# Patient Record
Sex: Female | Born: 1940
Health system: Southern US, Community
[De-identification: ages and names within clinical notes are randomized; demographics above are authoritative.]

## PROBLEM LIST (undated history)

## (undated) DIAGNOSIS — M25519 Pain in unspecified shoulder: Secondary | ICD-10-CM

## (undated) DIAGNOSIS — C50919 Malignant neoplasm of unspecified site of unspecified female breast: Secondary | ICD-10-CM

## (undated) DIAGNOSIS — Z803 Family history of malignant neoplasm of breast: Secondary | ICD-10-CM

## (undated) DIAGNOSIS — D369 Benign neoplasm, unspecified site: Secondary | ICD-10-CM

## (undated) DIAGNOSIS — I10 Essential (primary) hypertension: Secondary | ICD-10-CM

## (undated) DIAGNOSIS — T4145XA Adverse effect of unspecified anesthetic, initial encounter: Secondary | ICD-10-CM

## (undated) DIAGNOSIS — H3530 Unspecified macular degeneration: Secondary | ICD-10-CM

## (undated) DIAGNOSIS — M542 Cervicalgia: Secondary | ICD-10-CM

## (undated) DIAGNOSIS — N890 Mild vaginal dysplasia: Secondary | ICD-10-CM

## (undated) DIAGNOSIS — R112 Nausea with vomiting, unspecified: Secondary | ICD-10-CM

## (undated) DIAGNOSIS — J40 Bronchitis, not specified as acute or chronic: Secondary | ICD-10-CM

## (undated) DIAGNOSIS — Z923 Personal history of irradiation: Secondary | ICD-10-CM

## (undated) DIAGNOSIS — M810 Age-related osteoporosis without current pathological fracture: Secondary | ICD-10-CM

## (undated) DIAGNOSIS — Z853 Personal history of malignant neoplasm of breast: Secondary | ICD-10-CM

## (undated) DIAGNOSIS — C55 Malignant neoplasm of uterus, part unspecified: Principal | ICD-10-CM

## (undated) DIAGNOSIS — R55 Syncope and collapse: Secondary | ICD-10-CM

## (undated) DIAGNOSIS — Z9889 Other specified postprocedural states: Secondary | ICD-10-CM

## (undated) DIAGNOSIS — D259 Leiomyoma of uterus, unspecified: Secondary | ICD-10-CM

## (undated) DIAGNOSIS — N3090 Cystitis, unspecified without hematuria: Secondary | ICD-10-CM

## (undated) DIAGNOSIS — T8859XA Other complications of anesthesia, initial encounter: Secondary | ICD-10-CM

## (undated) DIAGNOSIS — D649 Anemia, unspecified: Secondary | ICD-10-CM

## (undated) HISTORY — DX: Benign neoplasm, unspecified site: D36.9

## (undated) HISTORY — DX: Family history of malignant neoplasm of breast: Z80.3

## (undated) HISTORY — DX: Anemia, unspecified: D64.9

## (undated) HISTORY — DX: Unspecified macular degeneration: H35.30

## (undated) HISTORY — PX: EXCISION VAGINAL CYST: SHX5825

## (undated) HISTORY — DX: Malignant neoplasm of uterus, part unspecified: C55

## (undated) HISTORY — DX: Personal history of malignant neoplasm of breast: Z85.3

## (undated) HISTORY — DX: Mild vaginal dysplasia: N89.0

## (undated) HISTORY — DX: Cystitis, unspecified without hematuria: N30.90

## (undated) HISTORY — DX: Malignant neoplasm of unspecified site of unspecified female breast: C50.919

## (undated) HISTORY — DX: Leiomyoma of uterus, unspecified: D25.9

## (undated) HISTORY — PX: CATARACT EXTRACTION: SUR2

## (undated) HISTORY — DX: Personal history of irradiation: Z92.3

## (undated) HISTORY — DX: Syncope and collapse: R55

## (undated) HISTORY — DX: Age-related osteoporosis without current pathological fracture: M81.0

---

## 1988-03-31 DIAGNOSIS — C50919 Malignant neoplasm of unspecified site of unspecified female breast: Secondary | ICD-10-CM

## 1988-03-31 HISTORY — PX: BREAST RECONSTRUCTION: SHX9

## 1988-03-31 HISTORY — PX: OTHER SURGICAL HISTORY: SHX169

## 1988-03-31 HISTORY — DX: Malignant neoplasm of unspecified site of unspecified female breast: C50.919

## 1992-08-29 HISTORY — PX: BREAST IMPLANT REMOVAL: SUR1101

## 1998-02-13 ENCOUNTER — Ambulatory Visit (HOSPITAL_COMMUNITY): Admission: RE | Admit: 1998-02-13 | Discharge: 1998-02-13 | Payer: Self-pay

## 1998-10-08 ENCOUNTER — Other Ambulatory Visit: Admission: RE | Admit: 1998-10-08 | Discharge: 1998-10-08 | Payer: Self-pay | Admitting: *Deleted

## 1999-02-18 ENCOUNTER — Ambulatory Visit (HOSPITAL_COMMUNITY): Admission: RE | Admit: 1999-02-18 | Discharge: 1999-02-18 | Payer: Self-pay | Admitting: *Deleted

## 1999-02-18 ENCOUNTER — Encounter: Payer: Self-pay | Admitting: *Deleted

## 1999-11-20 ENCOUNTER — Other Ambulatory Visit: Admission: RE | Admit: 1999-11-20 | Discharge: 1999-11-20 | Payer: Self-pay | Admitting: *Deleted

## 2000-02-19 ENCOUNTER — Ambulatory Visit (HOSPITAL_COMMUNITY): Admission: RE | Admit: 2000-02-19 | Discharge: 2000-02-19 | Payer: Self-pay | Admitting: *Deleted

## 2000-02-19 ENCOUNTER — Encounter: Payer: Self-pay | Admitting: *Deleted

## 2000-10-13 ENCOUNTER — Emergency Department (HOSPITAL_COMMUNITY): Admission: EM | Admit: 2000-10-13 | Discharge: 2000-10-13 | Payer: Self-pay | Admitting: Emergency Medicine

## 2000-10-18 ENCOUNTER — Encounter: Payer: Self-pay | Admitting: Internal Medicine

## 2000-10-18 ENCOUNTER — Ambulatory Visit (HOSPITAL_COMMUNITY): Admission: RE | Admit: 2000-10-18 | Discharge: 2000-10-18 | Payer: Self-pay | Admitting: Internal Medicine

## 2000-11-13 ENCOUNTER — Ambulatory Visit (HOSPITAL_COMMUNITY): Admission: RE | Admit: 2000-11-13 | Discharge: 2000-11-13 | Payer: Self-pay | Admitting: Cardiology

## 2000-11-19 ENCOUNTER — Other Ambulatory Visit: Admission: RE | Admit: 2000-11-19 | Discharge: 2000-11-19 | Payer: Self-pay | Admitting: *Deleted

## 2001-02-19 ENCOUNTER — Encounter: Payer: Self-pay | Admitting: *Deleted

## 2001-02-19 ENCOUNTER — Ambulatory Visit (HOSPITAL_COMMUNITY): Admission: RE | Admit: 2001-02-19 | Discharge: 2001-02-19 | Payer: Self-pay | Admitting: *Deleted

## 2001-11-22 ENCOUNTER — Other Ambulatory Visit: Admission: RE | Admit: 2001-11-22 | Discharge: 2001-11-22 | Payer: Self-pay | Admitting: *Deleted

## 2002-02-21 ENCOUNTER — Encounter: Payer: Self-pay | Admitting: *Deleted

## 2002-02-21 ENCOUNTER — Ambulatory Visit (HOSPITAL_COMMUNITY): Admission: RE | Admit: 2002-02-21 | Discharge: 2002-02-21 | Payer: Self-pay | Admitting: *Deleted

## 2003-02-24 ENCOUNTER — Ambulatory Visit (HOSPITAL_COMMUNITY): Admission: RE | Admit: 2003-02-24 | Discharge: 2003-02-24 | Payer: Self-pay | Admitting: Geriatric Medicine

## 2003-11-27 ENCOUNTER — Other Ambulatory Visit: Admission: RE | Admit: 2003-11-27 | Discharge: 2003-11-27 | Payer: Self-pay | Admitting: *Deleted

## 2004-03-05 ENCOUNTER — Ambulatory Visit (HOSPITAL_COMMUNITY): Admission: RE | Admit: 2004-03-05 | Discharge: 2004-03-05 | Payer: Self-pay | Admitting: *Deleted

## 2005-03-27 ENCOUNTER — Ambulatory Visit (HOSPITAL_COMMUNITY): Admission: RE | Admit: 2005-03-27 | Discharge: 2005-03-27 | Payer: Self-pay | Admitting: *Deleted

## 2005-12-02 ENCOUNTER — Other Ambulatory Visit: Admission: RE | Admit: 2005-12-02 | Discharge: 2005-12-02 | Payer: Self-pay | Admitting: Obstetrics & Gynecology

## 2005-12-03 ENCOUNTER — Encounter: Admission: RE | Admit: 2005-12-03 | Discharge: 2005-12-26 | Payer: Self-pay | Admitting: Oncology

## 2005-12-03 ENCOUNTER — Encounter (HOSPITAL_COMMUNITY): Admission: RE | Admit: 2005-12-03 | Discharge: 2005-12-26 | Payer: Self-pay | Admitting: Oncology

## 2005-12-03 ENCOUNTER — Ambulatory Visit (HOSPITAL_COMMUNITY): Payer: Self-pay | Admitting: Oncology

## 2006-04-01 ENCOUNTER — Ambulatory Visit (HOSPITAL_COMMUNITY): Admission: RE | Admit: 2006-04-01 | Discharge: 2006-04-01 | Payer: Self-pay | Admitting: Obstetrics and Gynecology

## 2006-10-05 ENCOUNTER — Encounter: Admission: RE | Admit: 2006-10-05 | Discharge: 2006-10-05 | Payer: Self-pay | Admitting: Surgery

## 2006-10-08 ENCOUNTER — Encounter (INDEPENDENT_AMBULATORY_CARE_PROVIDER_SITE_OTHER): Payer: Self-pay | Admitting: Surgery

## 2006-10-08 ENCOUNTER — Ambulatory Visit (HOSPITAL_BASED_OUTPATIENT_CLINIC_OR_DEPARTMENT_OTHER): Admission: RE | Admit: 2006-10-08 | Discharge: 2006-10-09 | Payer: Self-pay | Admitting: Surgery

## 2006-10-08 HISTORY — PX: MASTECTOMY: SHX3

## 2006-11-23 ENCOUNTER — Encounter: Admission: RE | Admit: 2006-11-23 | Discharge: 2006-11-23 | Payer: Self-pay | Admitting: Gastroenterology

## 2008-01-28 ENCOUNTER — Other Ambulatory Visit: Admission: RE | Admit: 2008-01-28 | Discharge: 2008-01-28 | Payer: Self-pay | Admitting: Obstetrics & Gynecology

## 2010-08-13 NOTE — Op Note (Signed)
NAME:  NGOC, DETJEN NO.:  [id number]   MEDICAL RECORD NO.:  [id number]          PATIENT TYPE:  AMB   LOCATION:  DSC                          FACILITY:  [facility]   PHYSICIAN:  Currie Paris, M.D.DATE OF BIRTH:  1940/06/06   DATE OF PROCEDURE:  10/08/2006  DATE OF DISCHARGE:                               OPERATIVE REPORT   PREOPERATIVE DIAGNOSIS:  History of left breast cancer status post  mastectomy with marked asymmetry.   POSTOPERATIVE DIAGNOSIS:  History of left breast cancer status post  mastectomy with marked asymmetry.   OPERATION:  Right total mastectomy (prophylactic).   SURGEON:  Currie Paris, M.D.   ANESTHESIA:  General.   CLINICAL HISTORY:  This is a 70 year old lady who is status post a left  mastectomy for breast cancer many years ago.  She originally had a  reconstruction done but that implant had to be taken out.  She has also  had some other risk factors for familial breast cancer and for reasons  of prophylaxis as well as to obtain symmetry, elected to have a right  mastectomy.   DESCRIPTION OF PROCEDURE:  The patient was seen in the holding area and  she had no further questions.  We confirmed that a right mastectomy was  the operative plan and we both initialed the right breast.  The patient  was taken to the operating room and after satisfactory general  anesthesia had been obtained, the right breast was prepped and draped.  The time out occurred.  I outlined an elliptical incision to take a  generous amount of skin.  She has fairly large breasts and I wanted to  make sure we had a fairly smooth skin surface.  The superior incision  was made and a skin flap raised medially to the sternum, superiorly to  the clavicle, and laterally into the axilla, to raise up the axillary  skin.  The inferior incision was made and the inferior flap raised going  medially to the sternum, inferiorly to the inframammary fold, and  laterally  until we could identify the latissimus and I was able to  identify the latissimus all the way along its anterior edge.   The breast was then removed from the underlying muscle starting medially  and working laterally.  When I got to the edge of the pectoralis, I  opened the clavipectoral fascia.  I then divided the breast and  subcutaneous fatty tissue off of the anterior border of the platysma  starting inferiorly and working superiorly such that when I had traction  up on the breast, I was then able to divide the final attachments as the  breast tissue entered the axilla, trying to get all of the breast out  without really entering the axilla proper.   I spent several minutes making sure everything was dry and irrigating.  I put two 19 Blake drains in.  I reinspected the area for hemostasis and  things were dry.  It appeared, however, that I had a lot of excess skin  laterally, especially the inferior lateral portion, and we were  going to  have a lot of dog ear type of changes here.  I, therefore, excised a  little additional skin laterally so that we would have a more smooth  closure.  I again irrigated and made sure everything was dry.  I closed  the incision with staples.   The patient tolerated the procedure well.  There were no operative  complications.  All counts were correct.  Estimated blood loss was less  than 50 mL.      Currie Paris, M.D.  Electronically Signed     CJS/MEDQ  D:  10/08/2006  T:  10/08/2006  Job:  [id number]   cc:   Ladona Horns. Mariel Sleet, MD

## 2010-08-16 NOTE — Procedures (Signed)
[facility]  Patient:    Beth Simon, Beth Simon Visit Number: [id number] MRN: [id number]          Service Type: CAT Location: [facility] 2899 [id number] Attending Physician:  Armanda Magic Proc. Date: 11/13/00 Adm. Date:  11/13/2000 Disc. Date: 11/13/2000   CC:         Hal T. Stoneking, M.D.   Procedure Report  PROCEDURE:  Tilt table test.  CARDIOLOGIST:  Armanda Magic, M.D.  HISTORY:  This is a 70 year old female with a history of syncopal episodes while she was at work leaning over, looking over a computer with her boss. 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  She was brought by EMS to the hospital fully conscious.  She now presents for tilt table testing.  INDICATIONS:  Syncope.  COMPLICATIONS:  None.  DESCRIPTION OF PROCEDURE.  The patient was brought to the cardiac catheterization laboratory in the fasting ______  state.  Informed consent was obtained.  The patient was connected to continuous heart rate and pulse oximetry monitoring and intermittent blood pressure monitoring.  The patient had blood pressures measured at baseline lying in a supine position for 5 minutes, and then was tilted upright 70 degrees for a total of 17 minutes.  Baseline blood pressure was 125/59 to 140/59.  Upon upright tilt, the blood pressure 16 minutes with the tilt dropped form 125/60 mmHg to 97/27 mmHg and then dropped at the 17th minute to 63/18 mmHg.  The heart rate also went from 118 beats per minute to 78 beats per minute acutely.  The patient became diaphoretic, pale, her pupils dilated.  She had ringing in her ears and developed facial twisting.  She did not have a complete syncopal episode,  It completely did reproduce her symptoms that she had the day that she had the syncopal episode.  She was immediately placed supine with resolution of hypotension.  IMPRESSION:   Positive tilt table test for vasovagal syncope.  PLAN:  Start Zoloft 25 mg a day.  She is going to follow up with me in several weeks. Attending Physician:  Armanda Magic DD:  11/17/00 TD:  11/17/00 Job: [id number]

## 2011-01-14 LAB — CBC
MCHC: 34.2
MCV: 92
RBC: 3.98

## 2011-01-14 LAB — DIFFERENTIAL
Lymphocytes Relative: 38
Lymphs Abs: 2.6
Neutro Abs: 3.5
Neutrophils Relative %: 50

## 2011-01-14 LAB — URINALYSIS, ROUTINE W REFLEX MICROSCOPIC
Bilirubin Urine: NEGATIVE
Nitrite: NEGATIVE
Specific Gravity, Urine: 1.01
Urobilinogen, UA: 0.2

## 2011-01-14 LAB — COMPREHENSIVE METABOLIC PANEL
AST: 26
BUN: 14
CO2: 27
Calcium: 8.8
Creatinine, Ser: 0.84
GFR calc Af Amer: >60
GFR calc non Af Amer: >60
Glucose, Bld: 96

## 2011-01-14 LAB — URINE MICROSCOPIC-ADD ON

## 2011-04-16 DIAGNOSIS — M722 Plantar fascial fibromatosis: Secondary | ICD-10-CM | POA: Diagnosis not present

## 2011-04-28 DIAGNOSIS — Z0000 Encounter for general adult medical examination without abnormal findings: Secondary | ICD-10-CM | POA: Diagnosis not present

## 2011-04-28 DIAGNOSIS — Z01419 Encounter for gynecological examination (general) (routine) without abnormal findings: Secondary | ICD-10-CM | POA: Diagnosis not present

## 2011-04-28 DIAGNOSIS — Z124 Encounter for screening for malignant neoplasm of cervix: Secondary | ICD-10-CM | POA: Diagnosis not present

## 2011-04-28 DIAGNOSIS — E559 Vitamin D deficiency, unspecified: Secondary | ICD-10-CM | POA: Diagnosis not present

## 2011-04-30 DIAGNOSIS — M722 Plantar fascial fibromatosis: Secondary | ICD-10-CM | POA: Diagnosis not present

## 2011-04-30 DIAGNOSIS — M25579 Pain in unspecified ankle and joints of unspecified foot: Secondary | ICD-10-CM | POA: Diagnosis not present

## 2011-06-13 ENCOUNTER — Other Ambulatory Visit: Payer: Self-pay | Admitting: Occupational Therapy

## 2011-06-13 ENCOUNTER — Ambulatory Visit
Admission: RE | Admit: 2011-06-13 | Discharge: 2011-06-13 | Disposition: A | Discharge status: Home / Self Care | Payer: Medicare Other | Source: Ambulatory Visit | Attending: Nurse Practitioner | Admitting: Nurse Practitioner

## 2011-06-13 DIAGNOSIS — Z0000 Encounter for general adult medical examination without abnormal findings: Secondary | ICD-10-CM

## 2011-06-13 DIAGNOSIS — Z853 Personal history of malignant neoplasm of breast: Secondary | ICD-10-CM | POA: Diagnosis not present

## 2011-06-13 DIAGNOSIS — M722 Plantar fascial fibromatosis: Secondary | ICD-10-CM | POA: Diagnosis not present

## 2011-06-13 DIAGNOSIS — M25579 Pain in unspecified ankle and joints of unspecified foot: Secondary | ICD-10-CM | POA: Diagnosis not present

## 2011-06-13 DIAGNOSIS — R9389 Abnormal findings on diagnostic imaging of other specified body structures: Secondary | ICD-10-CM | POA: Diagnosis not present

## 2011-06-17 ENCOUNTER — Ambulatory Visit: Payer: Medicare Other | Attending: Sports Medicine | Admitting: Physical Therapy

## 2011-06-17 DIAGNOSIS — M25673 Stiffness of unspecified ankle, not elsewhere classified: Secondary | ICD-10-CM | POA: Insufficient documentation

## 2011-06-17 DIAGNOSIS — M25579 Pain in unspecified ankle and joints of unspecified foot: Secondary | ICD-10-CM | POA: Diagnosis not present

## 2011-06-17 DIAGNOSIS — M25676 Stiffness of unspecified foot, not elsewhere classified: Secondary | ICD-10-CM | POA: Diagnosis not present

## 2011-06-17 DIAGNOSIS — IMO0001 Reserved for inherently not codable concepts without codable children: Secondary | ICD-10-CM | POA: Insufficient documentation

## 2011-06-19 ENCOUNTER — Ambulatory Visit: Payer: Medicare Other | Admitting: Physical Therapy

## 2011-06-19 DIAGNOSIS — M25673 Stiffness of unspecified ankle, not elsewhere classified: Secondary | ICD-10-CM | POA: Diagnosis not present

## 2011-06-19 DIAGNOSIS — M25579 Pain in unspecified ankle and joints of unspecified foot: Secondary | ICD-10-CM | POA: Diagnosis not present

## 2011-06-19 DIAGNOSIS — IMO0001 Reserved for inherently not codable concepts without codable children: Secondary | ICD-10-CM | POA: Diagnosis not present

## 2011-06-24 ENCOUNTER — Ambulatory Visit: Payer: Medicare Other | Admitting: Physical Therapy

## 2011-06-24 DIAGNOSIS — M25579 Pain in unspecified ankle and joints of unspecified foot: Secondary | ICD-10-CM | POA: Diagnosis not present

## 2011-06-24 DIAGNOSIS — M25673 Stiffness of unspecified ankle, not elsewhere classified: Secondary | ICD-10-CM | POA: Diagnosis not present

## 2011-06-24 DIAGNOSIS — IMO0001 Reserved for inherently not codable concepts without codable children: Secondary | ICD-10-CM | POA: Diagnosis not present

## 2011-06-25 ENCOUNTER — Ambulatory Visit: Payer: Medicare Other | Admitting: Physical Therapy

## 2011-06-25 DIAGNOSIS — M25579 Pain in unspecified ankle and joints of unspecified foot: Secondary | ICD-10-CM | POA: Diagnosis not present

## 2011-06-25 DIAGNOSIS — IMO0001 Reserved for inherently not codable concepts without codable children: Secondary | ICD-10-CM | POA: Diagnosis not present

## 2011-06-25 DIAGNOSIS — M25673 Stiffness of unspecified ankle, not elsewhere classified: Secondary | ICD-10-CM | POA: Diagnosis not present

## 2011-07-01 ENCOUNTER — Ambulatory Visit: Payer: Medicare Other | Attending: Sports Medicine | Admitting: Physical Therapy

## 2011-07-01 DIAGNOSIS — M545 Low back pain, unspecified: Secondary | ICD-10-CM | POA: Insufficient documentation

## 2011-07-01 DIAGNOSIS — R293 Abnormal posture: Secondary | ICD-10-CM | POA: Insufficient documentation

## 2011-07-01 DIAGNOSIS — M6281 Muscle weakness (generalized): Secondary | ICD-10-CM | POA: Insufficient documentation

## 2011-07-01 DIAGNOSIS — IMO0001 Reserved for inherently not codable concepts without codable children: Secondary | ICD-10-CM | POA: Insufficient documentation

## 2011-07-03 ENCOUNTER — Ambulatory Visit: Payer: Medicare Other | Attending: Sports Medicine | Admitting: Physical Therapy

## 2011-07-03 DIAGNOSIS — IMO0001 Reserved for inherently not codable concepts without codable children: Secondary | ICD-10-CM | POA: Insufficient documentation

## 2011-07-03 DIAGNOSIS — M25676 Stiffness of unspecified foot, not elsewhere classified: Secondary | ICD-10-CM | POA: Insufficient documentation

## 2011-07-03 DIAGNOSIS — M25579 Pain in unspecified ankle and joints of unspecified foot: Secondary | ICD-10-CM | POA: Insufficient documentation

## 2011-07-03 DIAGNOSIS — M25673 Stiffness of unspecified ankle, not elsewhere classified: Secondary | ICD-10-CM | POA: Insufficient documentation

## 2011-07-08 ENCOUNTER — Ambulatory Visit: Payer: Medicare Other | Admitting: Physical Therapy

## 2011-07-10 ENCOUNTER — Ambulatory Visit: Payer: Medicare Other | Admitting: Physical Therapy

## 2011-07-15 ENCOUNTER — Ambulatory Visit: Payer: Medicare Other | Admitting: Physical Therapy

## 2011-07-17 ENCOUNTER — Ambulatory Visit: Payer: Medicare Other | Admitting: Physical Therapy

## 2011-07-22 ENCOUNTER — Ambulatory Visit: Payer: Medicare Other | Admitting: Physical Therapy

## 2011-07-23 ENCOUNTER — Ambulatory Visit: Payer: Medicare Other | Admitting: Physical Therapy

## 2011-07-28 ENCOUNTER — Encounter: Payer: Medicare Other | Admitting: Physical Therapy

## 2011-08-01 DIAGNOSIS — L819 Disorder of pigmentation, unspecified: Secondary | ICD-10-CM | POA: Diagnosis not present

## 2011-08-01 DIAGNOSIS — L570 Actinic keratosis: Secondary | ICD-10-CM | POA: Diagnosis not present

## 2011-08-01 DIAGNOSIS — L608 Other nail disorders: Secondary | ICD-10-CM | POA: Diagnosis not present

## 2011-08-06 DIAGNOSIS — H8110 Benign paroxysmal vertigo, unspecified ear: Secondary | ICD-10-CM | POA: Diagnosis not present

## 2011-08-08 ENCOUNTER — Ambulatory Visit: Payer: Medicare Other | Attending: Sports Medicine | Admitting: Physical Therapy

## 2011-08-08 DIAGNOSIS — M545 Low back pain, unspecified: Secondary | ICD-10-CM | POA: Insufficient documentation

## 2011-08-08 DIAGNOSIS — M6281 Muscle weakness (generalized): Secondary | ICD-10-CM | POA: Insufficient documentation

## 2011-08-08 DIAGNOSIS — IMO0001 Reserved for inherently not codable concepts without codable children: Secondary | ICD-10-CM | POA: Insufficient documentation

## 2011-08-08 DIAGNOSIS — R293 Abnormal posture: Secondary | ICD-10-CM | POA: Diagnosis not present

## 2011-08-12 ENCOUNTER — Ambulatory Visit: Payer: Medicare Other | Admitting: Physical Therapy

## 2011-08-12 DIAGNOSIS — R293 Abnormal posture: Secondary | ICD-10-CM | POA: Diagnosis not present

## 2011-08-12 DIAGNOSIS — M6281 Muscle weakness (generalized): Secondary | ICD-10-CM | POA: Diagnosis not present

## 2011-08-12 DIAGNOSIS — M545 Low back pain, unspecified: Secondary | ICD-10-CM | POA: Diagnosis not present

## 2011-08-12 DIAGNOSIS — IMO0001 Reserved for inherently not codable concepts without codable children: Secondary | ICD-10-CM | POA: Diagnosis not present

## 2011-08-20 ENCOUNTER — Encounter: Payer: Medicare Other | Admitting: Physical Therapy

## 2011-08-20 DIAGNOSIS — H04129 Dry eye syndrome of unspecified lacrimal gland: Secondary | ICD-10-CM | POA: Diagnosis not present

## 2011-08-21 ENCOUNTER — Ambulatory Visit: Payer: Medicare Other | Admitting: Physical Therapy

## 2011-08-21 DIAGNOSIS — IMO0001 Reserved for inherently not codable concepts without codable children: Secondary | ICD-10-CM | POA: Diagnosis not present

## 2011-08-21 DIAGNOSIS — M545 Low back pain, unspecified: Secondary | ICD-10-CM | POA: Diagnosis not present

## 2011-08-21 DIAGNOSIS — R293 Abnormal posture: Secondary | ICD-10-CM | POA: Diagnosis not present

## 2011-08-21 DIAGNOSIS — M6281 Muscle weakness (generalized): Secondary | ICD-10-CM | POA: Diagnosis not present

## 2011-08-22 ENCOUNTER — Encounter: Payer: Medicare Other | Admitting: Physical Therapy

## 2011-08-27 ENCOUNTER — Encounter: Payer: Medicare Other | Admitting: Physical Therapy

## 2011-08-28 ENCOUNTER — Ambulatory Visit: Payer: Medicare Other | Admitting: Physical Therapy

## 2011-08-28 DIAGNOSIS — M6281 Muscle weakness (generalized): Secondary | ICD-10-CM | POA: Diagnosis not present

## 2011-08-28 DIAGNOSIS — M545 Low back pain, unspecified: Secondary | ICD-10-CM | POA: Diagnosis not present

## 2011-08-28 DIAGNOSIS — R293 Abnormal posture: Secondary | ICD-10-CM | POA: Diagnosis not present

## 2011-08-28 DIAGNOSIS — IMO0001 Reserved for inherently not codable concepts without codable children: Secondary | ICD-10-CM | POA: Diagnosis not present

## 2011-12-19 DIAGNOSIS — Z79899 Other long term (current) drug therapy: Secondary | ICD-10-CM | POA: Diagnosis not present

## 2011-12-19 DIAGNOSIS — E7800 Pure hypercholesterolemia, unspecified: Secondary | ICD-10-CM | POA: Diagnosis not present

## 2011-12-19 DIAGNOSIS — Z0000 Encounter for general adult medical examination without abnormal findings: Secondary | ICD-10-CM | POA: Diagnosis not present

## 2011-12-19 DIAGNOSIS — H8110 Benign paroxysmal vertigo, unspecified ear: Secondary | ICD-10-CM | POA: Diagnosis not present

## 2011-12-19 DIAGNOSIS — Z1331 Encounter for screening for depression: Secondary | ICD-10-CM | POA: Diagnosis not present

## 2012-01-06 ENCOUNTER — Ambulatory Visit: Payer: Medicare Other | Admitting: Physical Therapy

## 2012-01-06 DIAGNOSIS — Z23 Encounter for immunization: Secondary | ICD-10-CM | POA: Diagnosis not present

## 2012-01-13 ENCOUNTER — Ambulatory Visit: Payer: Medicare Other | Attending: Urology | Admitting: Rehabilitative and Restorative Service Providers"

## 2012-01-13 DIAGNOSIS — H8110 Benign paroxysmal vertigo, unspecified ear: Secondary | ICD-10-CM | POA: Insufficient documentation

## 2012-01-13 DIAGNOSIS — IMO0001 Reserved for inherently not codable concepts without codable children: Secondary | ICD-10-CM | POA: Diagnosis not present

## 2012-01-23 ENCOUNTER — Encounter: Payer: Medicare Other | Admitting: Rehabilitative and Restorative Service Providers"

## 2012-02-04 DIAGNOSIS — L819 Disorder of pigmentation, unspecified: Secondary | ICD-10-CM | POA: Diagnosis not present

## 2012-02-04 DIAGNOSIS — L219 Seborrheic dermatitis, unspecified: Secondary | ICD-10-CM | POA: Diagnosis not present

## 2012-02-04 DIAGNOSIS — L570 Actinic keratosis: Secondary | ICD-10-CM | POA: Diagnosis not present

## 2012-03-05 DIAGNOSIS — M810 Age-related osteoporosis without current pathological fracture: Secondary | ICD-10-CM | POA: Diagnosis not present

## 2012-04-14 DIAGNOSIS — M810 Age-related osteoporosis without current pathological fracture: Secondary | ICD-10-CM | POA: Diagnosis not present

## 2012-04-29 DIAGNOSIS — Z01419 Encounter for gynecological examination (general) (routine) without abnormal findings: Secondary | ICD-10-CM | POA: Diagnosis not present

## 2012-04-29 DIAGNOSIS — Z124 Encounter for screening for malignant neoplasm of cervix: Secondary | ICD-10-CM | POA: Diagnosis not present

## 2012-06-18 DIAGNOSIS — E559 Vitamin D deficiency, unspecified: Secondary | ICD-10-CM | POA: Diagnosis not present

## 2012-06-18 DIAGNOSIS — M25579 Pain in unspecified ankle and joints of unspecified foot: Secondary | ICD-10-CM | POA: Diagnosis not present

## 2012-06-18 DIAGNOSIS — M545 Low back pain, unspecified: Secondary | ICD-10-CM | POA: Diagnosis not present

## 2012-06-18 DIAGNOSIS — R5383 Other fatigue: Secondary | ICD-10-CM | POA: Diagnosis not present

## 2012-06-18 DIAGNOSIS — M810 Age-related osteoporosis without current pathological fracture: Secondary | ICD-10-CM | POA: Diagnosis not present

## 2012-06-18 DIAGNOSIS — R5381 Other malaise: Secondary | ICD-10-CM | POA: Diagnosis not present

## 2012-06-18 DIAGNOSIS — R079 Chest pain, unspecified: Secondary | ICD-10-CM | POA: Diagnosis not present

## 2012-06-18 DIAGNOSIS — S22009A Unspecified fracture of unspecified thoracic vertebra, initial encounter for closed fracture: Secondary | ICD-10-CM | POA: Diagnosis not present

## 2012-06-22 ENCOUNTER — Ambulatory Visit: Payer: Medicare Other | Attending: Sports Medicine | Admitting: Physical Therapy

## 2012-06-22 DIAGNOSIS — M25579 Pain in unspecified ankle and joints of unspecified foot: Secondary | ICD-10-CM | POA: Insufficient documentation

## 2012-06-22 DIAGNOSIS — IMO0001 Reserved for inherently not codable concepts without codable children: Secondary | ICD-10-CM | POA: Insufficient documentation

## 2012-06-22 DIAGNOSIS — M25673 Stiffness of unspecified ankle, not elsewhere classified: Secondary | ICD-10-CM | POA: Diagnosis not present

## 2012-06-22 DIAGNOSIS — M25676 Stiffness of unspecified foot, not elsewhere classified: Secondary | ICD-10-CM | POA: Insufficient documentation

## 2012-06-23 ENCOUNTER — Telehealth: Payer: Self-pay | Admitting: Nurse Practitioner

## 2012-06-23 ENCOUNTER — Ambulatory Visit: Payer: Medicare Other | Admitting: Physical Therapy

## 2012-06-23 NOTE — Telephone Encounter (Signed)
Pt called and wanted to let Patty know that she went to an orthopedic dr and the ortho dr wants her to start Prolia (?) for osteoporosis. Wanted to check with Patty to see if this was okay and a good idea. Please advise.

## 2012-06-24 DIAGNOSIS — M19049 Primary osteoarthritis, unspecified hand: Secondary | ICD-10-CM | POA: Diagnosis not present

## 2012-06-24 DIAGNOSIS — M2550 Pain in unspecified joint: Secondary | ICD-10-CM | POA: Diagnosis not present

## 2012-06-24 DIAGNOSIS — M25649 Stiffness of unspecified hand, not elsewhere classified: Secondary | ICD-10-CM | POA: Diagnosis not present

## 2012-06-24 NOTE — Telephone Encounter (Signed)
I do think Prolia is a good choice for her as there are less side effects.  PG

## 2012-06-28 DIAGNOSIS — M25649 Stiffness of unspecified hand, not elsewhere classified: Secondary | ICD-10-CM | POA: Diagnosis not present

## 2012-06-28 DIAGNOSIS — M2550 Pain in unspecified joint: Secondary | ICD-10-CM | POA: Diagnosis not present

## 2012-06-28 DIAGNOSIS — M19049 Primary osteoarthritis, unspecified hand: Secondary | ICD-10-CM | POA: Diagnosis not present

## 2012-06-29 ENCOUNTER — Ambulatory Visit: Payer: Medicare Other | Attending: Sports Medicine | Admitting: Physical Therapy

## 2012-06-29 DIAGNOSIS — IMO0001 Reserved for inherently not codable concepts without codable children: Secondary | ICD-10-CM | POA: Diagnosis not present

## 2012-06-29 DIAGNOSIS — H8110 Benign paroxysmal vertigo, unspecified ear: Secondary | ICD-10-CM | POA: Diagnosis not present

## 2012-06-30 ENCOUNTER — Telehealth: Payer: Self-pay | Admitting: *Deleted

## 2012-06-30 NOTE — Telephone Encounter (Signed)
PATIENT INFORMED OF ADVISE RESPONSE FROM [employer] OF APPROVAL FOR PATIENT TO BEGIN GETTING PROLIA INJECTIONS. PATIENT STATES HER ORTHOPEDIC DOCTOR AND CLINIC WILL BE GIVING THIS INJECTION TO HER. PATIENT TO CALL BACK IF ANY PROBLEMS. Beth Simon

## 2012-07-01 ENCOUNTER — Ambulatory Visit: Payer: Medicare Other | Admitting: Physical Therapy

## 2012-07-05 DIAGNOSIS — M25649 Stiffness of unspecified hand, not elsewhere classified: Secondary | ICD-10-CM | POA: Diagnosis not present

## 2012-07-05 DIAGNOSIS — M2550 Pain in unspecified joint: Secondary | ICD-10-CM | POA: Diagnosis not present

## 2012-07-05 DIAGNOSIS — M19049 Primary osteoarthritis, unspecified hand: Secondary | ICD-10-CM | POA: Diagnosis not present

## 2012-07-07 ENCOUNTER — Ambulatory Visit: Payer: Medicare Other | Admitting: Physical Therapy

## 2012-07-09 ENCOUNTER — Ambulatory Visit: Payer: Medicare Other | Admitting: Physical Therapy

## 2012-07-09 DIAGNOSIS — M19049 Primary osteoarthritis, unspecified hand: Secondary | ICD-10-CM | POA: Diagnosis not present

## 2012-07-12 DIAGNOSIS — M2550 Pain in unspecified joint: Secondary | ICD-10-CM | POA: Diagnosis not present

## 2012-07-12 DIAGNOSIS — M25649 Stiffness of unspecified hand, not elsewhere classified: Secondary | ICD-10-CM | POA: Diagnosis not present

## 2012-07-12 DIAGNOSIS — M19049 Primary osteoarthritis, unspecified hand: Secondary | ICD-10-CM | POA: Diagnosis not present

## 2012-07-13 ENCOUNTER — Encounter: Payer: Medicare Other | Admitting: Physical Therapy

## 2012-07-13 ENCOUNTER — Ambulatory Visit: Payer: Medicare Other | Admitting: Physical Therapy

## 2012-07-15 ENCOUNTER — Ambulatory Visit: Payer: Medicare Other | Admitting: Physical Therapy

## 2012-07-19 ENCOUNTER — Encounter: Payer: Self-pay | Admitting: Physical Therapy

## 2012-07-20 ENCOUNTER — Ambulatory Visit: Payer: Medicare Other | Admitting: Physical Therapy

## 2012-07-22 DIAGNOSIS — M19049 Primary osteoarthritis, unspecified hand: Secondary | ICD-10-CM | POA: Diagnosis not present

## 2012-07-22 DIAGNOSIS — M25649 Stiffness of unspecified hand, not elsewhere classified: Secondary | ICD-10-CM | POA: Diagnosis not present

## 2012-07-22 DIAGNOSIS — M2550 Pain in unspecified joint: Secondary | ICD-10-CM | POA: Diagnosis not present

## 2012-07-23 DIAGNOSIS — M810 Age-related osteoporosis without current pathological fracture: Secondary | ICD-10-CM | POA: Diagnosis not present

## 2012-07-23 DIAGNOSIS — M25549 Pain in joints of unspecified hand: Secondary | ICD-10-CM | POA: Diagnosis not present

## 2012-09-23 DIAGNOSIS — H04129 Dry eye syndrome of unspecified lacrimal gland: Secondary | ICD-10-CM | POA: Diagnosis not present

## 2013-01-04 ENCOUNTER — Ambulatory Visit
Admission: RE | Admit: 2013-01-04 | Discharge: 2013-01-04 | Disposition: A | Discharge status: Home / Self Care | Payer: Medicare Other | Source: Ambulatory Visit | Attending: Geriatric Medicine | Admitting: Geriatric Medicine

## 2013-01-04 ENCOUNTER — Other Ambulatory Visit: Payer: Self-pay | Admitting: Geriatric Medicine

## 2013-01-04 DIAGNOSIS — E041 Nontoxic single thyroid nodule: Secondary | ICD-10-CM

## 2013-01-04 DIAGNOSIS — Z1331 Encounter for screening for depression: Secondary | ICD-10-CM | POA: Diagnosis not present

## 2013-01-04 DIAGNOSIS — Z0000 Encounter for general adult medical examination without abnormal findings: Secondary | ICD-10-CM | POA: Diagnosis not present

## 2013-01-04 DIAGNOSIS — Z79899 Other long term (current) drug therapy: Secondary | ICD-10-CM | POA: Diagnosis not present

## 2013-01-04 DIAGNOSIS — E042 Nontoxic multinodular goiter: Secondary | ICD-10-CM | POA: Diagnosis not present

## 2013-01-14 DIAGNOSIS — M810 Age-related osteoporosis without current pathological fracture: Secondary | ICD-10-CM | POA: Diagnosis not present

## 2013-01-14 DIAGNOSIS — E559 Vitamin D deficiency, unspecified: Secondary | ICD-10-CM | POA: Diagnosis not present

## 2013-01-14 DIAGNOSIS — R5381 Other malaise: Secondary | ICD-10-CM | POA: Diagnosis not present

## 2013-02-04 DIAGNOSIS — M25549 Pain in joints of unspecified hand: Secondary | ICD-10-CM | POA: Diagnosis not present

## 2013-02-04 DIAGNOSIS — M810 Age-related osteoporosis without current pathological fracture: Secondary | ICD-10-CM | POA: Diagnosis not present

## 2013-02-18 ENCOUNTER — Ambulatory Visit (INDEPENDENT_AMBULATORY_CARE_PROVIDER_SITE_OTHER): Payer: Medicare Other

## 2013-02-18 ENCOUNTER — Ambulatory Visit (INDEPENDENT_AMBULATORY_CARE_PROVIDER_SITE_OTHER): Payer: Medicare Other | Admitting: Podiatrist

## 2013-02-18 DIAGNOSIS — B351 Tinea unguium: Secondary | ICD-10-CM

## 2013-02-18 DIAGNOSIS — Z23 Encounter for immunization: Secondary | ICD-10-CM | POA: Diagnosis not present

## 2013-02-18 DIAGNOSIS — R52 Pain, unspecified: Secondary | ICD-10-CM | POA: Diagnosis not present

## 2013-02-18 DIAGNOSIS — M779 Enthesopathy, unspecified: Secondary | ICD-10-CM

## 2013-02-18 DIAGNOSIS — D219 Benign neoplasm of connective and other soft tissue, unspecified: Secondary | ICD-10-CM | POA: Diagnosis not present

## 2013-02-18 DIAGNOSIS — D3610 Benign neoplasm of peripheral nerves and autonomic nervous system, unspecified: Secondary | ICD-10-CM

## 2013-02-18 NOTE — Progress Notes (Signed)
  Subjective:    Patient ID: Beth Simon, female    DOB: 1940-10-25, 72 y.o.   MRN: [id number]  Foot Pain This is a new problem. The current episode started more than 1 year ago. The problem occurs constantly. The problem has been unchanged. The symptoms are aggravated by standing and walking. Treatments tried: ORTHOTICS. The treatment provided no relief.   Subjective:  Patient presents for foot pain in the left foot, She relates she is experiencing a burning sensation.  She also relates she has painful elongated toenails which she would like addressed today as well.  She points to the top of the foot as the area of discomfort.      Review of Systems  Allergic/Immunologic: Positive for environmental allergies.  All other systems reviewed and are negative.       Objective:   Physical Exam GENERAL APPEARANCE: Alert, conversant. Appropriately groomed. No acute distress.  VASCULAR: Pedal pulses palpable bilateral.  Capillary refill time is immediate to all digits,  Proximal to distal cooling it warm to warm.  Digital hair growth is present bilateral  NEUROLOGIC: sensation is intact epicritically and protectively to 5.07 monofilament at 5/5 sites bilateral.  Light touch is intact bilateral, vibratory sensation intact bilateral,  Neuritis type symptomatology is present at the 2nd and 3rd interspace on the left foot   MUSCULOSKELETAL: acceptable muscle strength, tone and stability bilateral.  Intrinsic muscluature intact bilateral.  Discomfort along the dorsal lateral aspect of the left foot is present  DERMATOLOGIC: skin color, texture, and turger are within normal limits.  No preulcerative lesions are seen, no interdigital maceration noted.  No open lesions present.  Digital nails are elongated but otherwise normal.      Assessment & Plan:  Assessment:  Neuroma/ neuritis; tendonitis,  Painful mycotic nails  Plan:  Discussed treatment options,  Inserted a pad on her existing inserts to try  and re-align and decrease the pressure on the metatarsal heads.  Debrided the nails without complication.  Discussed injection therapy as well.  I will see her back for a recheck of her symptoms.  Nails were also debrided per her request today- patient was instructed that this will be a non-covered service due to the absence of class finding and normal appearance of nails.  Recommended [facility] for further care of her nails.

## 2013-02-18 NOTE — Patient Instructions (Signed)
Try [facility] at [address] area.    Call if the nerve pain fails to improve or if your feet still hurt even after the added padding placed in your inserts.

## 2013-04-29 ENCOUNTER — Encounter: Payer: Self-pay | Admitting: Obstetrics & Gynecology

## 2013-05-02 ENCOUNTER — Encounter: Payer: Self-pay | Admitting: Nurse Practitioner

## 2013-05-02 ENCOUNTER — Ambulatory Visit (INDEPENDENT_AMBULATORY_CARE_PROVIDER_SITE_OTHER): Payer: Medicare Other | Admitting: Nurse Practitioner

## 2013-05-02 VITALS — BP 120/80 | HR 72 | Ht 59.0 in | Wt 129.0 lb

## 2013-05-02 DIAGNOSIS — M810 Age-related osteoporosis without current pathological fracture: Secondary | ICD-10-CM | POA: Insufficient documentation

## 2013-05-02 DIAGNOSIS — C50919 Malignant neoplasm of unspecified site of unspecified female breast: Secondary | ICD-10-CM | POA: Diagnosis not present

## 2013-05-02 DIAGNOSIS — F439 Reaction to severe stress, unspecified: Secondary | ICD-10-CM | POA: Insufficient documentation

## 2013-05-02 DIAGNOSIS — Z733 Stress, not elsewhere classified: Secondary | ICD-10-CM | POA: Diagnosis not present

## 2013-05-02 DIAGNOSIS — Z01419 Encounter for gynecological examination (general) (routine) without abnormal findings: Secondary | ICD-10-CM

## 2013-05-02 DIAGNOSIS — C50912 Malignant neoplasm of unspecified site of left female breast: Secondary | ICD-10-CM | POA: Insufficient documentation

## 2013-05-02 DIAGNOSIS — Z124 Encounter for screening for malignant neoplasm of cervix: Secondary | ICD-10-CM

## 2013-05-02 DIAGNOSIS — Z9013 Acquired absence of bilateral breasts and nipples: Secondary | ICD-10-CM | POA: Insufficient documentation

## 2013-05-02 DIAGNOSIS — Z9010 Acquired absence of unspecified breast and nipple: Secondary | ICD-10-CM

## 2013-05-02 NOTE — Patient Instructions (Signed)

## 2013-05-02 NOTE — Progress Notes (Signed)
Patient ID: Beth Simon, female   DOB: 1940/08/25, 73 y.o.   MRN: [id number] 73 y.o. G1P1 Married Caucasian Fe here for annual exam.  Has had 2 shots of Prolia. Last one November 2014 and scheduled for BMD maybe this May or routinely in December.  Dr. Wandra Feinstein is following.  Left posterior rib area with pain since last February.  She has had X-Rays which show no fracture but she still hurts in the same area since then. She admits to being tired all the time.  PCP feels this is muscular since she has to do a lot of lifting with her husband.  He  is now 75 years old and is bedridden.  He is now followed by Hospice.  His bed wound on the heel is her biggest concern.  Patient's last menstrual period was 03/31/1988.          Sexually active: no  The current method of family planning is none.    Exercising: no  caregiver, on feet all the time Smoker:  no  Health Maintenance: Pap:  04/29/12, WNL MMG:  bilateral mastectomy Colonoscopy:  2006, repeat in 10 years BMD:  02/2012, -2.6/-2.2/-2.0 TDaP:  ? 2008 Shingles vaccine: 2008 Labs: PCP   reports that she has never smoked. She has never used smokeless tobacco. She reports that she does not drink alcohol or use illicit drugs.  Past Medical History  Diagnosis Date  . Osteoporosis     on Evista  . Breast cancer     bilateral mastectomy, adenoca breast-left MRM, reconstruction, chemo  . Cystitis     cytoxen  . Vasovagal syncopes   . Macular degeneration   . Anemia   . Uterine fibroid     Past Surgical History  Procedure Laterality Date  . Excision vaginal cyst    . Breast implant removal  6/94    left breast  . Cataract extraction      left  . Mastectomy  10/08/06    bilateral    Current Outpatient Prescriptions  Medication Sig Dispense Refill  . Calcium-Magnesium-Vitamin D (CALCIUM 500 PO) Take by mouth daily.      . Coenzyme Q10 (COQ10 PO) Take by mouth daily.      . Cyanocobalamin (VITAMIN B 12 PO) Take by mouth.       . denosumab (PROLIA) 60 MG/ML SOLN injection Inject 60 mg into the skin every 6 (six) months. Administer in upper arm, thigh, or abdomen      . Flaxseed, Linseed, (FLAX SEEDS PO) Take by mouth daily.      . Glucosamine HCl (GLUCOSAMINE PO) Take by mouth daily.      Marland Kitchen MELATONIN PO Take by mouth daily.      . NON FORMULARY mag powder      . Omega-3 Fatty Acids (FISH OIL PO) Take by mouth daily.      . raloxifene (EVISTA) 60 MG tablet Take 60 mg by mouth daily.      Marland Kitchen VITAMIN D, CHOLECALCIFEROL, PO Take 4,000 Int'l Units by mouth daily.       No current facility-administered medications for this visit.    Family History  Problem Relation Age of Onset  . Breast cancer Sister 78    recurrence age 46  . Breast cancer Paternal Grandmother   . Heart disease Mother   . Heart disease Father   . Hypertension Sister     ROS:  Pertinent items are noted in HPI.  Otherwise, a comprehensive  ROS was negative.  Exam:   BP 120/80  Pulse 72  Ht 4\' 11"  (1.499 m)  Wt 129 lb (58.514 kg)  BMI 26.04 kg/m2  LMP 03/31/1988 Height: 4\' 11"  (149.9 cm)  Ht Readings from Last 3 Encounters:  05/02/13 4\' 11"  (1.499 m)    General appearance: alert, cooperative and appears stated age Head: Normocephalic, without obvious abnormality, atraumatic Neck: no adenopathy, supple, symmetrical, trachea midline and thyroid normal to inspection and palpation Lungs: clear to auscultation bilaterally Breasts: bilateral mastectomy with surgical scars. Heart: regular rate and rhythm Abdomen: soft, non-tender; no masses,  no organomegaly Extremities: extremities normal, atraumatic, no cyanosis or edema Skin: Skin color, texture, turgor normal. No rashes or lesions Lymph nodes: Cervical, supraclavicular, and axillary nodes normal. No abnormal inguinal nodes palpated Neurologic: Grossly normal   Pelvic: External genitalia:  no lesions              Urethra:  normal appearing urethra with no masses, tenderness or  lesions              Bartholin's and Skene's: normal                 Vagina: normal appearing vagina with normal color and discharge, no lesions              Cervix: anteverted              Pap taken: no Bimanual Exam:  Uterus:  normal size, contour, position, consistency, mobility, non-tender              Adnexa: no mass, fullness, tenderness               Rectovaginal: Confirms               Anus:  normal sphincter tone, no lesions  A:  Well Woman with normal exam  Postmenopausal  S/P bilateral mastectomy secondary to left cancer 06/1988 with implant removed 6/94, Right mastectomy 09/2006 prophylactically  Osteoporosis treated by PCP with Prolia    P:   Pap smear as per guidelines Not done  Mammogram not indicated  Counseled on breast self exam, mammography screening, adequate intake of calcium and vitamin D, diet and exercise, Kegel's exercises return annually or prn  An After Visit Summary was printed and given to the patient.

## 2013-05-04 ENCOUNTER — Telehealth: Payer: Self-pay | Admitting: Nurse Practitioner

## 2013-05-04 NOTE — Telephone Encounter (Signed)
Return call to patient, female states patient is not in.  LMTCB.  AEX 05-03-13 with Patty.   Last Vit D here 04-2011 was 58. PCP is managing her osteoporosis and patient is on Prolia. Should she get Vit D and Evista from PCP?

## 2013-05-04 NOTE — Progress Notes (Signed)
Encounter reviewed by Dr. Dale Ribeiro Silva.  

## 2013-05-04 NOTE — Telephone Encounter (Signed)
Patient wants to know if she is due to have her vitamin D rechecked and she also needs Raloxifene and Prosthetic bra RX's needed.  Patient wants to pick up RX's.

## 2013-05-05 NOTE — Telephone Encounter (Signed)
We can give her a written RX for her mastectomy bra.  But I understood that since PCP was ordering her Prolia - that they were giving her the Vit D and drawing her labs.  If there needs to be a change then we have to get Vit D labs.

## 2013-05-06 NOTE — Telephone Encounter (Signed)
Written RX left at front desk for patient to pick up.  Pt will call ortho on Monday to see if they have vitamin d level results.  If not she will schedule a lab appt here and pick up RX at same time.

## 2013-05-11 NOTE — Telephone Encounter (Signed)
Patient is calling stephanie back to let her know what she found out from her orthopedic

## 2013-05-17 ENCOUNTER — Other Ambulatory Visit: Payer: Self-pay | Admitting: Nurse Practitioner

## 2013-05-17 MED ORDER — RALOXIFENE HCL 60 MG PO TABS: 60.0000 mg | ORAL_TABLET | Freq: Every day | ORAL | Status: DC

## 2013-05-17 NOTE — Telephone Encounter (Signed)
Pt is notified of below.  She is agreeable.  Pt requests RX for prosthetic bra to be placed in the mail due to weather and husband's illness.  This is done.

## 2013-05-17 NOTE — Telephone Encounter (Signed)
I spoke to the patient.  She had Vitamin D level drawn in 12/2012 with orthopedist.  That level was 57.  Pt is currently taking 4000 IU OTC of vitamin d.  Do we want to make any changes?  Also, pt would like to keep getting Evista from [place].  Only sees ortho for Prolia injection.  Please advise.

## 2013-05-17 NOTE — Telephone Encounter (Signed)
Vit D is ok at OTC dosage - so continue.  Also we can do her Evista - I will place the order in Epic.

## 2013-07-12 DIAGNOSIS — I8310 Varicose veins of unspecified lower extremity with inflammation: Secondary | ICD-10-CM | POA: Diagnosis not present

## 2013-07-13 DIAGNOSIS — I8310 Varicose veins of unspecified lower extremity with inflammation: Secondary | ICD-10-CM | POA: Diagnosis not present

## 2013-07-19 DIAGNOSIS — I8310 Varicose veins of unspecified lower extremity with inflammation: Secondary | ICD-10-CM | POA: Diagnosis not present

## 2013-07-26 DIAGNOSIS — M810 Age-related osteoporosis without current pathological fracture: Secondary | ICD-10-CM | POA: Diagnosis not present

## 2013-07-26 DIAGNOSIS — E559 Vitamin D deficiency, unspecified: Secondary | ICD-10-CM | POA: Diagnosis not present

## 2013-07-26 DIAGNOSIS — R5381 Other malaise: Secondary | ICD-10-CM | POA: Diagnosis not present

## 2013-07-28 ENCOUNTER — Ambulatory Visit (INDEPENDENT_AMBULATORY_CARE_PROVIDER_SITE_OTHER): Payer: Medicare Other | Admitting: Podiatrist

## 2013-07-28 VITALS — Resp 16 | Ht 60.0 in | Wt 127.0 lb

## 2013-07-28 DIAGNOSIS — R52 Pain, unspecified: Secondary | ICD-10-CM | POA: Diagnosis not present

## 2013-07-28 DIAGNOSIS — B351 Tinea unguium: Secondary | ICD-10-CM

## 2013-08-02 NOTE — Progress Notes (Signed)
HPI:  Patient presents today for follow up of foot and nail care. Denies any new complaints today.  Objective:  Patients chart is reviewed.  Vascular status rev3eals pedal pulses noted at 1 out of 4 dp and pt bilateral .  Neurological sensation is Normal to [employer] monofilament bilateral.  Patients nails are thickened, discolored, distrophic, friable and brittle with yellow-brown discoloration. Patient subjectively relates they are painful with shoes and with ambulation of bilateral feet.  Assessment:  Symptomatic onychomycosis  Plan:  Discussed treatment options and alternatives.  The symptomatic toenails were debrided through manual an mechanical means without complication.  Return appointment recommended at routine intervals of 3 months    Trudie Buckler, DPM

## 2013-08-11 DIAGNOSIS — M19049 Primary osteoarthritis, unspecified hand: Secondary | ICD-10-CM | POA: Diagnosis not present

## 2013-08-11 DIAGNOSIS — M810 Age-related osteoporosis without current pathological fracture: Secondary | ICD-10-CM | POA: Diagnosis not present

## 2013-08-24 DIAGNOSIS — M19049 Primary osteoarthritis, unspecified hand: Secondary | ICD-10-CM | POA: Diagnosis not present

## 2013-10-07 ENCOUNTER — Ambulatory Visit (INDEPENDENT_AMBULATORY_CARE_PROVIDER_SITE_OTHER): Payer: Medicare Other | Admitting: Neurology

## 2013-10-07 ENCOUNTER — Encounter (INDEPENDENT_AMBULATORY_CARE_PROVIDER_SITE_OTHER): Payer: Self-pay

## 2013-10-07 ENCOUNTER — Encounter: Payer: Self-pay | Admitting: Neurology

## 2013-10-07 VITALS — BP 152/78 | HR 86 | Temp 97.6°F | Ht 60.0 in | Wt 131.0 lb

## 2013-10-07 DIAGNOSIS — Z853 Personal history of malignant neoplasm of breast: Secondary | ICD-10-CM

## 2013-10-07 DIAGNOSIS — M25519 Pain in unspecified shoulder: Secondary | ICD-10-CM | POA: Diagnosis not present

## 2013-10-07 DIAGNOSIS — R251 Tremor, unspecified: Secondary | ICD-10-CM

## 2013-10-07 DIAGNOSIS — R259 Unspecified abnormal involuntary movements: Secondary | ICD-10-CM

## 2013-10-07 DIAGNOSIS — M25511 Pain in right shoulder: Secondary | ICD-10-CM

## 2013-10-07 NOTE — Patient Instructions (Signed)
We will do blood work today We will do a neck MRI We will consider physical therapy We will call you with your test results Let's hold off on any new meds See you back in 3 months, sooner if needed.

## 2013-10-07 NOTE — Progress Notes (Signed)
Subjective:    Patient ID: Beth Simon is a 73 y.o. female.  HPI    Star Age, MD, PhD [facility] [address], [address] [place], [place] [address]  Dear Dr. Layne Benton,   I saw your patient, Beth Simon, upon your kind request in my neurologic clinic today for initial consultation of her tremors. The patient is unaccompanied today. As you know, [name] is a very friendly 73 year old right-handed woman with an underlying medical history of breast cancer, status post bilateral mastectomy, osteoporosis, arthritis of her hands (particulary both thumbs for which she recently had streroid injections under your care), and vitamin D deficiency, who has had a b/l UE tremor for the past years, perhaps first noted in 2006 in the R thumb. She retired some 8 years ago and it started before the retirement. She also cared for her ailing husband for a long time and especially in the last year he was bed-ridden and required full care and she did notice that she had some issues with the R shoulder area. Her husband passed away about 5 or 6 weeks ago. She is coping relatively well. She lives alone. She has had no issues driving. She reports no cognitive problems. She plays the piano. It feels like there is something in there, like a tickle and she does note, her posture is not very good. There is no significant neck pain, no radiating pain, but did have a spasm in the posterior neck last night. She does note neck soreness, when she reads something bent over. She has noted very mild balance issues, no falls. She has no FHx of ET, PD or other tremors. She has discomfort in the R forearm.    Her Past Medical History Is Significant For: Past Medical History  Diagnosis Date  . Osteoporosis     on Evista  . Breast cancer     bilateral mastectomy, adenoca breast-left MRM, reconstruction, chemo  . Cystitis     cytoxen  . Vasovagal syncopes   . Macular degeneration   .  Anemia   . Uterine fibroid     Her Past Surgical History Is Significant For: Past Surgical History  Procedure Laterality Date  . Excision vaginal cyst    . Breast implant removal  6/94    left breast  . Cataract extraction      left  . Mastectomy  10/08/06    bilateral    Her Family History Is Significant For: Family History  Problem Relation Age of Onset  . Breast cancer Sister 47    recurrence age 62  . Breast cancer Paternal Grandmother   . Heart disease Mother   . Heart disease Father   . Hypertension Sister     Her Social History Is Significant For: History   Social History  . Marital Status: Married    Spouse Name: N/A    Number of Children: 1  . Years of Education: N/A   Social History Main Topics  . Smoking status: Never Smoker   . Smokeless tobacco: Never Used  . Alcohol Use: No  . Drug Use: No  . Sexual Activity: No   Other Topics Concern  . None   Social History Narrative  . None    Her Allergies Are:  No Known Allergies:   Her Current Medications Are:  Outpatient Encounter Prescriptions as of 10/07/2013  Medication Sig  . Calcium-Magnesium-Vitamin D (CALCIUM 500 PO) Take by mouth daily.  . Coenzyme  Q10 (COQ10 PO) Take by mouth daily.  . Cyanocobalamin (VITAMIN B 12 PO) Take by mouth.  . denosumab (PROLIA) 60 MG/ML SOLN injection Inject 60 mg into the skin every 6 (six) months. Administer in upper arm, thigh, or abdomen  . Flaxseed, Linseed, (FLAX SEEDS PO) Take by mouth daily.  . Glucosamine HCl (GLUCOSAMINE PO) Take by mouth daily.  Marland Kitchen MELATONIN PO Take by mouth daily.  . NON FORMULARY mag powder  . Omega-3 Fatty Acids (FISH OIL PO) Take by mouth daily.  . raloxifene (EVISTA) 60 MG tablet Take 1 tablet (60 mg total) by mouth daily.  Marland Kitchen VITAMIN D, CHOLECALCIFEROL, PO Take 4,000 Int'l Units by mouth daily.  :   Review of Systems:  Out of a complete 14 point review of systems, all are reviewed and negative with the exception of these  symptoms as listed below:  Review of Systems  Constitutional: Positive for fatigue.  HENT: Negative.   Eyes: Negative.   Respiratory: Negative.   Cardiovascular: Positive for palpitations and leg swelling.  Gastrointestinal: Negative.   Endocrine: Negative.   Genitourinary: Negative.   Musculoskeletal: Positive for arthralgias.  Skin: Negative.   Allergic/Immunologic: Negative.   Neurological: Positive for tremors.  Hematological: Negative.   Psychiatric/Behavioral: Negative.     Objective:  Neurologic Exam  Physical Exam Physical Examination:   Filed Vitals:   10/07/13 0837  BP: 152/78  Pulse: 86  Temp: 97.6 F (36.4 C)    General Examination: The patient is a very pleasant 73 y.o. female in no acute distress. She appears well-developed and well-nourished and well groomed. She is mildly anxious appearing.  HEENT: Normocephalic, atraumatic, pupils are equal, round and reactive to light and accommodation. Funduscopic exam is normal with sharp disc margins noted. Extraocular tracking is good without limitation to gaze excursion or nystagmus noted. Normal smooth pursuit is noted. Hearing is grossly intact. Tympanic membranes are clear bilaterally. Face is symmetric with normal facial animation and normal facial sensation. Speech is clear with no dysarthria noted. There is no hypophonia. There is no lip, neck/head, jaw or voice tremor. Neck is supple with full range of passive and active motion. There are no carotid bruits on auscultation. Oropharynx exam reveals: mild mouth dryness, adequate dental hygiene and no significant airway crowding. Mild puffiness class I. Tongue protrudes centrally and palate elevates symmetrically.  Her right shoulder height is a little less than the left. She has no significant muscle tightness or tenderness in the posterior neck area.  Chest: Clear to auscultation without wheezing, rhonchi or crackles noted.  Heart: S1+S2+0, regular and normal without  murmurs, rubs or gallops noted.   Abdomen: Soft, non-tender and non-distended with normal bowel sounds appreciated on auscultation.  Extremities: There is no pitting edema in the distal lower extremities bilaterally. Pedal pulses are intact. She has no significant decrease in range of motion in her right shoulder but does feel a pulling in the right deltoid area with passive mobilization. There is no crepitation.  Skin: Warm and dry without trophic changes noted. There are no significant varicose veins except for one large one in the right medial calf and also multiple spider veins.   Musculoskeletal: exam reveals no obvious joint deformities, tenderness or joint swelling or erythema.   Neurologically:  Mental status: The patient is awake, alert and oriented in all 4 spheres. Her immediate and remote memory, attention, language skills and fund of knowledge are appropriate. There is no evidence of aphasia, agnosia, apraxia or anomia. Speech  is clear with normal prosody and enunciation. Thought process is linear. Mood is normal and affect is normal.  Cranial nerves II - XII are as described above under HEENT exam. In addition: shoulder shrug is normal with equal shoulder height noted. Motor exam: Normal bulk, strength and tone is noted. There is no drift, or rebound. There is no resting tremor. There is a bilateral upper extremity postural and action tremor, which is minimal in degree right-sided little bit more pronounced. There tremor frequency is fairly fast and the amplitude is small. On Archimedes spiral drawing there is very mild tremulousness noted. Handwriting is mildly tremulous, but legible. There is evidence of very mild micrographia.  Romberg is negative. Reflexes are 2+ throughout with the exception that I did not get a good biceps response on the right. Babinski: Toes are flexor bilaterally. Fine motor skills and coordination: intact with normal finger taps, normal hand movements, normal  rapid alternating patting, normal foot taps and normal foot agility.  Cerebellar testing: No dysmetria or intention tremor on finger to nose testing. Heel to shin is unremarkable bilaterally. There is no truncal or gait ataxia.  Sensory exam: intact to light touch, pinprick, vibration, temperature sense in the upper and lower extremities.  Gait, station and balance: She stands easily. No veering to one side is noted. No leaning to one side is noted. Posture is age-appropriate or slightly more stooped for age and stance is narrow based. Gait shows normal stride length and normal pace but she does have a slight decrease in arm swing on the right.  No problems turning are noted. She turns en bloc. Tandem walk is unremarkable. Intact toe and heel stance is noted.               Assessment and Plan:   In summary, NISHIKA PARKHURST is a very pleasant 73 y.o.-year old female with an underlying medical history of breast cancer, status post bilateral mastectomy, osteoporosis, arthritis of her hands (particulary both thumbs for which she recently had streroid injections under your care), and vitamin D deficiency, who has had a b/l UE tremor for the past years, perhaps first noted in 2006 in the R thumb.on examination she has a very mild degree of tremors right more than left, no resting tremor. Some change in shoulder height, a pulling sensation reported in her right deltoid without obvious decrease in range of motion. She has some decrease in reflex response in the right biceps. She does report some discomfort in her neck area and right shoulder area, not enough to call it actual pain. She does not have any clear-cut parkinsonian signs or symptoms with the exception that she does have a decreased arm swing on the right. I suggested that we follow her tremor clinically but also check some blood work today to look for inflammatory changes, muscle breakdown etc. I would like to go ahead and proceed with the cervical spine  MRI. Later on we may suggest looking at her right shoulder if you agree. I will let you discuss this with the patient as well. If everything looks fairly well we can proceed with a round of physical therapy. She can pursue this at your clinic if this is more convenient for her. I'll let you discuss this with her as well. Per minute things, we will call her with her test results as soon as we have them available and for now I would shy away from trying any medication to reduce her tremor as it  is mild and there is typically no specific medication for tremors and some medications tend to have side effects. I would like to see her back in about 3 months for recheck.   Thank you very much for allowing me to participate in the care of this nice patient. If I can be of any further assistance to you please do not hesitate to call me at [phone].  Sincerely,   Star Age, MD, PhD

## 2013-10-09 LAB — ANA W/REFLEX: Anti Nuclear Antibody(ANA): NEGATIVE

## 2013-10-09 LAB — C-REACTIVE PROTEIN: CRP: 1 mg/L (ref 0.0–4.9)

## 2013-10-09 LAB — HGB A1C W/O EAG: Hgb A1c MFr Bld: 5.9 % — ABNORMAL HIGH (ref 4.8–5.6)

## 2013-10-09 LAB — ALDOLASE: ALDOLASE: 5.9 U/L (ref 3.3–10.3)

## 2013-10-09 LAB — CK: Total CK: 72 U/L (ref 24–173)

## 2013-10-09 LAB — TSH: TSH: 2.35 u[IU]/mL (ref 0.450–4.500)

## 2013-10-10 NOTE — Progress Notes (Signed)
Quick Note:  Please call patient and advise her that her recent labs were unremarkable with the exception of a borderline hemoglobin A1c, which is a diabetes marker. This does not mean that she has frank diabetes but may be at risk for it. Otherwise, inflammatory markers, muscle enzymes, and thyroid screening was normal. Star Age, MD, PhD [facility]  ______

## 2013-10-11 ENCOUNTER — Ambulatory Visit
Admission: RE | Admit: 2013-10-11 | Discharge: 2013-10-11 | Disposition: A | Discharge status: Home / Self Care | Payer: Medicare Other | Source: Ambulatory Visit | Attending: Neurology | Admitting: Neurology

## 2013-10-11 DIAGNOSIS — M4802 Spinal stenosis, cervical region: Secondary | ICD-10-CM | POA: Diagnosis not present

## 2013-10-11 DIAGNOSIS — R251 Tremor, unspecified: Secondary | ICD-10-CM

## 2013-10-11 DIAGNOSIS — N390 Urinary tract infection, site not specified: Secondary | ICD-10-CM | POA: Diagnosis not present

## 2013-10-11 DIAGNOSIS — R259 Unspecified abnormal involuntary movements: Secondary | ICD-10-CM

## 2013-10-11 DIAGNOSIS — M47812 Spondylosis without myelopathy or radiculopathy, cervical region: Secondary | ICD-10-CM | POA: Diagnosis not present

## 2013-10-11 DIAGNOSIS — R109 Unspecified abdominal pain: Secondary | ICD-10-CM | POA: Diagnosis not present

## 2013-10-11 DIAGNOSIS — R3915 Urgency of urination: Secondary | ICD-10-CM | POA: Diagnosis not present

## 2013-10-11 DIAGNOSIS — M25511 Pain in right shoulder: Secondary | ICD-10-CM

## 2013-10-11 DIAGNOSIS — Z853 Personal history of malignant neoplasm of breast: Secondary | ICD-10-CM

## 2013-10-11 DIAGNOSIS — M5020 Other cervical disc displacement, unspecified cervical region: Secondary | ICD-10-CM | POA: Diagnosis not present

## 2013-10-11 MED ORDER — GADOBENATE DIMEGLUMINE 529 MG/ML IV SOLN
13.0000 mL | Freq: Once | INTRAVENOUS | Status: AC | PRN
Start: 2013-10-11 — End: 2013-10-11
  Administered 2013-10-11: 13 mL via INTRAVENOUS

## 2013-10-11 NOTE — Progress Notes (Signed)
Quick Note:  I called pt and relayed the lab results. (elevated HgbA1c level). She is familiar with this and verbalized understanding. ______

## 2013-10-13 DIAGNOSIS — I8310 Varicose veins of unspecified lower extremity with inflammation: Secondary | ICD-10-CM | POA: Diagnosis not present

## 2013-10-17 NOTE — Progress Notes (Signed)
Quick Note:  Please advise patient of her cervical spine MRI: It did show some degenerative changes. Thankfully no abnormal signals in her spinal cord but she does have more right-sided degenerative changes at the L3-4 and L4-5 level. As we discussed during your clinic visit, this is probably best discussed further with her orthopedic doctor. Star Age, MD, PhD [facility]  ______

## 2013-10-19 DIAGNOSIS — I8310 Varicose veins of unspecified lower extremity with inflammation: Secondary | ICD-10-CM | POA: Diagnosis not present

## 2013-10-19 DIAGNOSIS — M79609 Pain in unspecified limb: Secondary | ICD-10-CM | POA: Diagnosis not present

## 2013-10-20 ENCOUNTER — Telehealth: Payer: Self-pay | Admitting: Neurology

## 2013-10-20 NOTE — Telephone Encounter (Signed)
Patient returning Sandy's call for results.

## 2013-10-21 ENCOUNTER — Encounter: Payer: Self-pay | Admitting: *Deleted

## 2013-10-21 ENCOUNTER — Encounter: Payer: Self-pay | Admitting: Neurology

## 2013-10-21 DIAGNOSIS — M79609 Pain in unspecified limb: Secondary | ICD-10-CM | POA: Diagnosis not present

## 2013-10-21 DIAGNOSIS — I8310 Varicose veins of unspecified lower extremity with inflammation: Secondary | ICD-10-CM | POA: Diagnosis not present

## 2013-10-21 NOTE — Progress Notes (Deleted)
Subjective:    Patient ID: Beth Simon is a 73 y.o. female.  HPI {Common ambulatory SmartLinks:19316}  Review of Systems  Objective:  Neurologic Exam  Physical Exam  Assessment:   ***  Plan:   ***

## 2013-10-21 NOTE — Progress Notes (Signed)
I notified pt of her MRI cervical results.  The result note states L3-4 and L 4-5 and should be C3-4 and C4-5.  I explained to the pt and Dr. Rexene Alberts notified.

## 2013-10-21 NOTE — Progress Notes (Signed)
This encounter was created in error - please disregard.

## 2013-10-21 NOTE — Progress Notes (Signed)
Quick Note:  I called pt and gave her the results of MRI. She has seen Wandra Feinstein with [facility]. Will mail copy to her of mychart activation code and her MRI report. ______

## 2013-10-24 NOTE — Telephone Encounter (Signed)
Lovey Newcomer, RN has already spoken with pt concerning results.

## 2013-10-27 ENCOUNTER — Ambulatory Visit (INDEPENDENT_AMBULATORY_CARE_PROVIDER_SITE_OTHER): Payer: Medicare Other | Admitting: Podiatrist

## 2013-10-27 ENCOUNTER — Other Ambulatory Visit: Payer: Medicare Other

## 2013-10-27 DIAGNOSIS — B351 Tinea unguium: Secondary | ICD-10-CM

## 2013-10-27 DIAGNOSIS — M79609 Pain in unspecified limb: Secondary | ICD-10-CM

## 2013-10-27 DIAGNOSIS — R52 Pain, unspecified: Secondary | ICD-10-CM

## 2013-10-27 NOTE — Progress Notes (Signed)
   HPI:  Patient presents today for follow up of foot and nail care. Denies any new complaints today.  Objective:  Patients chart is reviewed.  Vascular status reveals pedal pulses noted at 2 out of 4 dp and pt bilateral .  Neurological sensation is Normal to [employer] monofilament bilateral.  Patients nails are thickened, discolored, distrophic, friable and brittle with yellow-brown discoloration. Patient subjectively relates they are painful with shoes and with ambulation of bilateral feet. Callus at the tips of toes 2,3 left are present  Assessment:  Symptomatic onychomycosis  Plan:  Discussed treatment options and alternatives.  The symptomatic toenails were debrided through manual an mechanical means without complication.  Return appointment recommended at routine intervals of 3 months    Trudie Buckler, DPM

## 2013-10-31 DIAGNOSIS — M79609 Pain in unspecified limb: Secondary | ICD-10-CM | POA: Diagnosis not present

## 2013-10-31 DIAGNOSIS — I8310 Varicose veins of unspecified lower extremity with inflammation: Secondary | ICD-10-CM | POA: Diagnosis not present

## 2013-11-21 DIAGNOSIS — M7981 Nontraumatic hematoma of soft tissue: Secondary | ICD-10-CM | POA: Diagnosis not present

## 2013-11-21 DIAGNOSIS — I8310 Varicose veins of unspecified lower extremity with inflammation: Secondary | ICD-10-CM | POA: Diagnosis not present

## 2013-11-21 DIAGNOSIS — M79609 Pain in unspecified limb: Secondary | ICD-10-CM | POA: Diagnosis not present

## 2013-12-06 DIAGNOSIS — I8310 Varicose veins of unspecified lower extremity with inflammation: Secondary | ICD-10-CM | POA: Diagnosis not present

## 2013-12-16 DIAGNOSIS — M19049 Primary osteoarthritis, unspecified hand: Secondary | ICD-10-CM | POA: Diagnosis not present

## 2013-12-16 DIAGNOSIS — M47812 Spondylosis without myelopathy or radiculopathy, cervical region: Secondary | ICD-10-CM | POA: Diagnosis not present

## 2013-12-20 DIAGNOSIS — I8310 Varicose veins of unspecified lower extremity with inflammation: Secondary | ICD-10-CM | POA: Diagnosis not present

## 2013-12-20 DIAGNOSIS — M25529 Pain in unspecified elbow: Secondary | ICD-10-CM | POA: Diagnosis not present

## 2013-12-20 DIAGNOSIS — M542 Cervicalgia: Secondary | ICD-10-CM | POA: Diagnosis not present

## 2013-12-20 DIAGNOSIS — M79609 Pain in unspecified limb: Secondary | ICD-10-CM | POA: Diagnosis not present

## 2013-12-20 DIAGNOSIS — R293 Abnormal posture: Secondary | ICD-10-CM | POA: Diagnosis not present

## 2013-12-22 DIAGNOSIS — R293 Abnormal posture: Secondary | ICD-10-CM | POA: Diagnosis not present

## 2013-12-22 DIAGNOSIS — M542 Cervicalgia: Secondary | ICD-10-CM | POA: Diagnosis not present

## 2013-12-22 DIAGNOSIS — M79609 Pain in unspecified limb: Secondary | ICD-10-CM | POA: Diagnosis not present

## 2013-12-22 DIAGNOSIS — M25529 Pain in unspecified elbow: Secondary | ICD-10-CM | POA: Diagnosis not present

## 2013-12-23 DIAGNOSIS — M79609 Pain in unspecified limb: Secondary | ICD-10-CM | POA: Diagnosis not present

## 2013-12-23 DIAGNOSIS — R293 Abnormal posture: Secondary | ICD-10-CM | POA: Diagnosis not present

## 2013-12-23 DIAGNOSIS — M25529 Pain in unspecified elbow: Secondary | ICD-10-CM | POA: Diagnosis not present

## 2013-12-23 DIAGNOSIS — M542 Cervicalgia: Secondary | ICD-10-CM | POA: Diagnosis not present

## 2013-12-26 DIAGNOSIS — M542 Cervicalgia: Secondary | ICD-10-CM | POA: Diagnosis not present

## 2013-12-26 DIAGNOSIS — M25529 Pain in unspecified elbow: Secondary | ICD-10-CM | POA: Diagnosis not present

## 2013-12-26 DIAGNOSIS — R293 Abnormal posture: Secondary | ICD-10-CM | POA: Diagnosis not present

## 2013-12-26 DIAGNOSIS — M79609 Pain in unspecified limb: Secondary | ICD-10-CM | POA: Diagnosis not present

## 2013-12-27 DIAGNOSIS — M79609 Pain in unspecified limb: Secondary | ICD-10-CM | POA: Diagnosis not present

## 2013-12-27 DIAGNOSIS — M542 Cervicalgia: Secondary | ICD-10-CM | POA: Diagnosis not present

## 2013-12-27 DIAGNOSIS — M25529 Pain in unspecified elbow: Secondary | ICD-10-CM | POA: Diagnosis not present

## 2013-12-27 DIAGNOSIS — R293 Abnormal posture: Secondary | ICD-10-CM | POA: Diagnosis not present

## 2013-12-29 DIAGNOSIS — M542 Cervicalgia: Secondary | ICD-10-CM | POA: Diagnosis not present

## 2013-12-29 DIAGNOSIS — M25521 Pain in right elbow: Secondary | ICD-10-CM | POA: Diagnosis not present

## 2013-12-29 DIAGNOSIS — R293 Abnormal posture: Secondary | ICD-10-CM | POA: Diagnosis not present

## 2013-12-29 DIAGNOSIS — M79641 Pain in right hand: Secondary | ICD-10-CM | POA: Diagnosis not present

## 2014-01-02 DIAGNOSIS — M25521 Pain in right elbow: Secondary | ICD-10-CM | POA: Diagnosis not present

## 2014-01-02 DIAGNOSIS — M542 Cervicalgia: Secondary | ICD-10-CM | POA: Diagnosis not present

## 2014-01-02 DIAGNOSIS — M79641 Pain in right hand: Secondary | ICD-10-CM | POA: Diagnosis not present

## 2014-01-02 DIAGNOSIS — R293 Abnormal posture: Secondary | ICD-10-CM | POA: Diagnosis not present

## 2014-01-03 ENCOUNTER — Ambulatory Visit (INDEPENDENT_AMBULATORY_CARE_PROVIDER_SITE_OTHER): Payer: Medicare Other | Admitting: Nurse Practitioner

## 2014-01-03 ENCOUNTER — Encounter: Payer: Self-pay | Admitting: Nurse Practitioner

## 2014-01-03 VITALS — BP 124/80 | HR 72 | Ht 60.0 in | Wt 135.0 lb

## 2014-01-03 DIAGNOSIS — N950 Postmenopausal bleeding: Secondary | ICD-10-CM

## 2014-01-03 DIAGNOSIS — M25521 Pain in right elbow: Secondary | ICD-10-CM | POA: Diagnosis not present

## 2014-01-03 DIAGNOSIS — R293 Abnormal posture: Secondary | ICD-10-CM | POA: Diagnosis not present

## 2014-01-03 DIAGNOSIS — Z9013 Acquired absence of bilateral breasts and nipples: Secondary | ICD-10-CM | POA: Diagnosis not present

## 2014-01-03 DIAGNOSIS — M810 Age-related osteoporosis without current pathological fracture: Secondary | ICD-10-CM | POA: Diagnosis not present

## 2014-01-03 DIAGNOSIS — M542 Cervicalgia: Secondary | ICD-10-CM | POA: Diagnosis not present

## 2014-01-03 DIAGNOSIS — Z658 Other specified problems related to psychosocial circumstances: Secondary | ICD-10-CM

## 2014-01-03 DIAGNOSIS — F439 Reaction to severe stress, unspecified: Secondary | ICD-10-CM

## 2014-01-03 DIAGNOSIS — M79641 Pain in right hand: Secondary | ICD-10-CM | POA: Diagnosis not present

## 2014-01-03 MED ORDER — RALOXIFENE HCL 60 MG PO TABS: 60.0000 mg | ORAL_TABLET | Freq: Every day | ORAL | Status: DC

## 2014-01-03 NOTE — Patient Instructions (Signed)
Endometrial Biopsy Endometrial biopsy is a procedure in which a tissue sample is taken from inside the uterus. The tissue sample is then looked at under a microscope to see if the tissue is normal or abnormal. The endometrium is the lining of the uterus. This procedure helps determine where you are in your menstrual cycle and how hormone levels are affecting the lining of the uterus. This procedure may also be used to evaluate uterine bleeding or to diagnose endometrial cancer, tuberculosis, polyps, or inflammatory conditions.  LET YOUR HEALTH CARE PROVIDER KNOW ABOUT:  Any allergies you have.  All medicines you are taking, including vitamins, herbs, eye drops, creams, and over-the-counter medicines.  Previous problems you or members of your family have had with the use of anesthetics.  Any blood disorders you have.  Previous surgeries you have had.  Medical conditions you have.  Possibility of pregnancy. RISKS AND COMPLICATIONS Generally, this is a safe procedure. However, as with any procedure, complications can occur. Possible complications include:  Bleeding.  Pelvic infection.  Puncture of the uterine wall with the biopsy device (rare). BEFORE THE PROCEDURE   Keep a record of your menstrual cycles as directed by your health care provider. You may need to schedule your procedure for a specific time in your cycle.  You may want to bring a sanitary pad to wear home after the procedure.  Arrange for someone to drive you home after the procedure if you will be given a medicine to help you relax (sedative). PROCEDURE   You may be given a sedative to relax you.  You will lie on an exam table with your feet and legs supported as in a pelvic exam.  Your health care provider will insert an instrument (speculum) into your vagina to see your cervix.  Your cervix will be cleansed with an antiseptic solution. A medicine (local anesthetic) will be used to numb the cervix.  A forceps  instrument (tenaculum) will be used to hold your cervix steady for the biopsy.  A thin, rodlike instrument (uterine sound) will be inserted through your cervix to determine the length of your uterus and the location where the biopsy sample will be removed.  A thin, flexible tube (catheter) will be inserted through your cervix and into the uterus. The catheter is used to collect the biopsy sample from your endometrial tissue.  The catheter and speculum will then be removed, and the tissue sample will be sent to a lab for examination. AFTER THE PROCEDURE  You will rest in a recovery area until you are ready to go home.  You may have mild cramping and a small amount of vaginal bleeding for a few days after the procedure. This is normal.  Make sure you find out how to get your test results. Document Released: 07/18/2004 Document Revised: 11/17/2012 Document Reviewed: 09/01/2012 ExitCare Patient Information 2015 [employer]. This information is not intended to replace advice given to you by your health care provider. Make sure you discuss any questions you have with your health care provider.  

## 2014-01-03 NOTE — Progress Notes (Signed)
Subjective:     Patient ID: Beth Simon, female   DOB: [birth year], 73 y.o.   MRN: [id number]  HPI  This 73 yo WW Fe presents today with a 2 week history of brown to pink staining on underwear and occasionally with wiping.  She has been under a lot of stress with her husband- Lonnie dying in May.  She has been physically trying to get the hospital/ home health supplies returned and getting paper work done.  She states she feels as though she is "bleeding from grief".   She did note an occasional pelvic discomfort during this time but not really menstrual related.  She does feel bloated most of time despite her change of diet.  She feels bloated when she awakens in the morning which is not normal for her.  She denies urinary symptoms and denies changes in bowel functions.  No new med's no herbal supplements.    Review of Systems  Constitutional: Positive for appetite change and unexpected weight change. Negative for fever, chills, diaphoresis and fatigue.       Wt from 128 to 135 thought to be related to stress  HENT: Negative.   Respiratory: Negative.   Cardiovascular: Negative.   Gastrointestinal: Positive for abdominal pain. Negative for nausea, vomiting, diarrhea, constipation, blood in stool, abdominal distention and anal bleeding.  Genitourinary: Positive for vaginal bleeding. Negative for dysuria, urgency, frequency, hematuria, flank pain, decreased urine volume, vaginal discharge, difficulty urinating and vaginal pain.  Musculoskeletal: Negative.   Skin: Negative.   Neurological: Negative.   Psychiatric/Behavioral: Positive for sleep disturbance, dysphoric mood and decreased concentration. The patient is nervous/anxious.        All related to husbands recent death       Objective:   Physical Exam  Constitutional: She is oriented to person, place, and time. She appears well-developed and well-nourished.  Pulmonary/Chest: Breath sounds normal.  Abdominal: Soft. She exhibits no  distension. There is no tenderness. There is no rebound.  No flank pain  Genitourinary:  Light brown mucous discharge from the cervix otherwise no signs of infection.  Not tender at the urethra.  Dr. Sabra Heck in surgery and not available to do endo biopsy  Neurological: She is alert and oriented to person, place, and time.  Skin: Skin is warm and dry.  Psychiatric: She has a normal mood and affect. Her behavior is normal. Judgment and thought content normal.  Mood is general is sad and tearful at times       Assessment:     PMB  Grief response to husbands death S/P breast cancer with bilateral mastectomy 09/2006 History of Osteoporosis - on Prolia    Plan:     PUS/ Endo Biopsy on Thursday if at all possible Refill Evista for now

## 2014-01-04 ENCOUNTER — Telehealth: Payer: Self-pay | Admitting: *Deleted

## 2014-01-04 DIAGNOSIS — N950 Postmenopausal bleeding: Secondary | ICD-10-CM

## 2014-01-04 NOTE — Telephone Encounter (Signed)
Pt called sally back during lunch.

## 2014-01-04 NOTE — Telephone Encounter (Signed)
Per review with Edman Circle FNP, needs PUS and endo biopsy.  Patient called approximately 3pm. Advised that first available ultrasound appointment is Thursday 01-12-14 at 830. Offered to call her is earlier appointment becomes available. Due to her travel plans on 01-17-14 for 3 weeks, advised we can attempt rush pathology request in effort to get results before her trip. Patient appreciative.  Routing to provider for final review. Patient agreeable to disposition. Will close encounter

## 2014-01-04 NOTE — Telephone Encounter (Signed)
Call to patient regarding scheduling of PUS/endo biopsy, LMTCB.

## 2014-01-05 ENCOUNTER — Ambulatory Visit: Payer: Medicare Other | Admitting: Obstetrics and Gynecology

## 2014-01-05 DIAGNOSIS — M79652 Pain in left thigh: Secondary | ICD-10-CM | POA: Diagnosis not present

## 2014-01-05 DIAGNOSIS — M79651 Pain in right thigh: Secondary | ICD-10-CM | POA: Diagnosis not present

## 2014-01-05 DIAGNOSIS — M7981 Nontraumatic hematoma of soft tissue: Secondary | ICD-10-CM | POA: Diagnosis not present

## 2014-01-05 DIAGNOSIS — M79605 Pain in left leg: Secondary | ICD-10-CM | POA: Diagnosis not present

## 2014-01-05 DIAGNOSIS — M79604 Pain in right leg: Secondary | ICD-10-CM | POA: Diagnosis not present

## 2014-01-06 DIAGNOSIS — M25521 Pain in right elbow: Secondary | ICD-10-CM | POA: Diagnosis not present

## 2014-01-06 DIAGNOSIS — R5381 Other malaise: Secondary | ICD-10-CM | POA: Diagnosis not present

## 2014-01-06 DIAGNOSIS — M542 Cervicalgia: Secondary | ICD-10-CM | POA: Diagnosis not present

## 2014-01-06 DIAGNOSIS — R293 Abnormal posture: Secondary | ICD-10-CM | POA: Diagnosis not present

## 2014-01-06 DIAGNOSIS — M810 Age-related osteoporosis without current pathological fracture: Secondary | ICD-10-CM | POA: Diagnosis not present

## 2014-01-06 DIAGNOSIS — M79644 Pain in right finger(s): Secondary | ICD-10-CM | POA: Diagnosis not present

## 2014-01-06 DIAGNOSIS — E559 Vitamin D deficiency, unspecified: Secondary | ICD-10-CM | POA: Diagnosis not present

## 2014-01-06 DIAGNOSIS — M79645 Pain in left finger(s): Secondary | ICD-10-CM | POA: Diagnosis not present

## 2014-01-06 DIAGNOSIS — M79641 Pain in right hand: Secondary | ICD-10-CM | POA: Diagnosis not present

## 2014-01-08 DIAGNOSIS — Z23 Encounter for immunization: Secondary | ICD-10-CM | POA: Diagnosis not present

## 2014-01-08 NOTE — Progress Notes (Signed)
Encounter reviewed by Dr. Brook Silva.  

## 2014-01-09 DIAGNOSIS — M79641 Pain in right hand: Secondary | ICD-10-CM | POA: Diagnosis not present

## 2014-01-09 DIAGNOSIS — M542 Cervicalgia: Secondary | ICD-10-CM | POA: Diagnosis not present

## 2014-01-09 DIAGNOSIS — M25521 Pain in right elbow: Secondary | ICD-10-CM | POA: Diagnosis not present

## 2014-01-09 DIAGNOSIS — R293 Abnormal posture: Secondary | ICD-10-CM | POA: Diagnosis not present

## 2014-01-10 ENCOUNTER — Ambulatory Visit (INDEPENDENT_AMBULATORY_CARE_PROVIDER_SITE_OTHER): Payer: Medicare Other | Admitting: Gynecology

## 2014-01-10 ENCOUNTER — Encounter: Payer: Self-pay | Admitting: Gynecology

## 2014-01-10 ENCOUNTER — Ambulatory Visit (INDEPENDENT_AMBULATORY_CARE_PROVIDER_SITE_OTHER): Payer: Medicare Other

## 2014-01-10 VITALS — BP 132/80 | HR 68 | Ht 60.0 in | Wt 136.0 lb

## 2014-01-10 DIAGNOSIS — N950 Postmenopausal bleeding: Secondary | ICD-10-CM

## 2014-01-10 DIAGNOSIS — N9489 Other specified conditions associated with female genital organs and menstrual cycle: Secondary | ICD-10-CM | POA: Diagnosis not present

## 2014-01-10 DIAGNOSIS — N8502 Endometrial intraepithelial neoplasia [EIN]: Secondary | ICD-10-CM | POA: Diagnosis not present

## 2014-01-10 NOTE — Progress Notes (Signed)
      Images reviewed with pt- Endometrial mass with feeder vessel noted, cystic changes-2x1.5cm, uterus, ovaries atrophic, no free fluid Uterine fibroid noted. Recommend EMB. Pt consented, risks of bleeding, infection, uterine perforation discussed. BME performed. Speculum placed, cervix cleansed with betadine x3, xylocaine jelly placed anterior cervix, stabilized with single tooth, milex pipelle advanced to 6- 2 passes for adequate tissue. Pt tolerated well Tissue to pathology Will triage on results Pt is going out of country in 1w, will try to expidite

## 2014-01-12 ENCOUNTER — Ambulatory Visit: Payer: Medicare Other | Admitting: Podiatrist

## 2014-01-12 ENCOUNTER — Other Ambulatory Visit: Payer: Medicare Other

## 2014-01-12 ENCOUNTER — Other Ambulatory Visit: Payer: Medicare Other | Admitting: Obstetrics and Gynecology

## 2014-01-13 ENCOUNTER — Telehealth: Payer: Self-pay | Admitting: Gynecology

## 2014-01-13 DIAGNOSIS — M542 Cervicalgia: Secondary | ICD-10-CM | POA: Diagnosis not present

## 2014-01-13 DIAGNOSIS — M79641 Pain in right hand: Secondary | ICD-10-CM | POA: Diagnosis not present

## 2014-01-13 DIAGNOSIS — M25521 Pain in right elbow: Secondary | ICD-10-CM | POA: Diagnosis not present

## 2014-01-13 DIAGNOSIS — R293 Abnormal posture: Secondary | ICD-10-CM | POA: Diagnosis not present

## 2014-01-13 LAB — IPS OTHER TISSUE BIOPSY

## 2014-01-13 NOTE — Telephone Encounter (Signed)
Just received results of EMB done 01/10/14-complex hyperplasia with atypia explained to pt.  Referral to gyn-onc for evaluation and treatment

## 2014-01-16 ENCOUNTER — Telehealth: Payer: Self-pay | Admitting: Gynecology

## 2014-01-16 ENCOUNTER — Ambulatory Visit: Payer: Medicare Other | Admitting: Gynecologic Oncology

## 2014-01-16 NOTE — Telephone Encounter (Signed)
Per computer, Appt with Dr Skeet Latch 02-09-14 at [place] to provider for final review. Patient agreeable to disposition. Will close encounter

## 2014-01-16 NOTE — Telephone Encounter (Signed)
Call back to Dr Serita Grit office, they are unable to get patient in today. First available is Friday. Since I am unsure of patients travel plans. I will have her call them.  Call to patient and given update from Dr Serita Grit office Phone number given and she will call them to schedule appointment.

## 2014-01-16 NOTE — Telephone Encounter (Signed)
Pt callign to see if sally had heard anything yet.

## 2014-01-16 NOTE — Telephone Encounter (Signed)
Pt said that Dr lathrop called her Friday and gave her results from biopsy. She told her that she was sending her to dr Denman George and her appt would be 02/16/14 at 8:30. But the appt is scheduled for today.pt tried to call the their office but wasn't able to get anyone on the phone.

## 2014-01-16 NOTE — Telephone Encounter (Signed)
Call to patient, she insists that Dr lathrop scheduled her for 02-16-14 but she received email that appointment was 01-16-14.  Advised that Dr lathrop did schedule appointment for 01-16-14 so she could be seen and evaluated before trip to [facility] and have surgical plans in place for when she returns. Patient very insistent that she was told November 19 by Dr Charlies Constable and that is when she intends to go. Explained that there is just a misunderstanding, Dr Charlies Constable requested work in appointment for her at [place] before her Tuesday trip so I was sure of Dr lathrop desire for her to be seen. Dr Charlies Constable walked by office so she came in and spoke with patient directly. Call placed to GYN/Onc office (from another line) while Dr Charlies Constable talking to patient to see if we can get her worked back into their schedule. Patient leaving tomorrow at 6pm. Dr Serita Grit office to call me back.

## 2014-01-30 ENCOUNTER — Encounter: Payer: Self-pay | Admitting: Gynecology

## 2014-02-08 DIAGNOSIS — M25521 Pain in right elbow: Secondary | ICD-10-CM | POA: Diagnosis not present

## 2014-02-08 DIAGNOSIS — R600 Localized edema: Secondary | ICD-10-CM | POA: Diagnosis not present

## 2014-02-08 DIAGNOSIS — I83891 Varicose veins of right lower extremities with other complications: Secondary | ICD-10-CM | POA: Diagnosis not present

## 2014-02-08 DIAGNOSIS — M542 Cervicalgia: Secondary | ICD-10-CM | POA: Diagnosis not present

## 2014-02-08 DIAGNOSIS — M79641 Pain in right hand: Secondary | ICD-10-CM | POA: Diagnosis not present

## 2014-02-08 DIAGNOSIS — R293 Abnormal posture: Secondary | ICD-10-CM | POA: Diagnosis not present

## 2014-02-09 ENCOUNTER — Encounter: Payer: Self-pay | Admitting: Gynecologic Oncology

## 2014-02-09 ENCOUNTER — Ambulatory Visit: Payer: Medicare Other | Attending: Gynecologic Oncology | Admitting: Gynecologic Oncology

## 2014-02-09 VITALS — BP 133/81 | HR 75 | Temp 98.3°F | Resp 18 | Ht 60.0 in | Wt 133.3 lb

## 2014-02-09 DIAGNOSIS — Z9011 Acquired absence of right breast and nipple: Secondary | ICD-10-CM | POA: Diagnosis not present

## 2014-02-09 DIAGNOSIS — Z853 Personal history of malignant neoplasm of breast: Secondary | ICD-10-CM | POA: Insufficient documentation

## 2014-02-09 DIAGNOSIS — N8502 Endometrial intraepithelial neoplasia [EIN]: Secondary | ICD-10-CM | POA: Diagnosis not present

## 2014-02-09 DIAGNOSIS — Z803 Family history of malignant neoplasm of breast: Secondary | ICD-10-CM | POA: Diagnosis not present

## 2014-02-09 DIAGNOSIS — Z90722 Acquired absence of ovaries, bilateral: Secondary | ICD-10-CM | POA: Diagnosis not present

## 2014-02-09 DIAGNOSIS — Z90710 Acquired absence of both cervix and uterus: Secondary | ICD-10-CM | POA: Insufficient documentation

## 2014-02-09 NOTE — Progress Notes (Signed)
Consult Note: Gyn-Onc  Consult was requested by Dr. Charlies Constable for the evaluation of Beth Simon 73 y.o. female  CC: Endometrial complex atypical hyperplasia  Assessment/Plan:  [name]  is a 73 y.o.  With CAH.  Recommendation was made for hysterectomy BSO  With staging as indicated based on frozen section findings.    I extensively reviewed the risks of robotic hysterectomy bilateral salpingooophorectomy and possible lymph node dissection of infection, bleeding, damage to nearby organs including bowel, bladder, vessels, nerves, and ureters. We discussed postoperative risks including infection, PE/ DVT, and lymphedema. We reviewed possible need for conversion to open laparotomy, need for possible blood transfusion. I discussed positioning during surgery of trendelenberg and risks of minor facial swelling and care we take in preoperative positioning. We also reviewed possible need for further treatment such as radiation or chemotherapy based on final pathology. Expected postoperative recovery was also discussed.  The procedure is scheduled for 03/07/2014.  [name] is aware that Dr. Nancy Marus will be her surgeon.  All of her questions were answered to her satisfaction.  Patient is aware that given her personal and family history genetic counseling will be arranged post -operatively.  Will likely request IHC staining if malignancy is identified.   HPI: [name]  is a 73 y.o.   Gi P1 menopausal.  Noted irregular pelvic cramping after the death of her husband.  Over the past two months she noticed vaginal discharge.  Vaginal ultrasound 01/10/2014 endometrial stripe 14.45mm.  Endometrial mass with cystic changes noted 2x1 cm.  Atrophic ovaries.  Uterine size 6.46x4.21 cm.  EMB 01/10/2014 Complex atypical hyperplasia.  History notable for left breast cancer ER-PR-  treated with radical mastectomy and chemotherapy in 1990.  Subsequently underwent right simple  mastectomy 2008 without reconstruction. Denies use of tamoxifen  Reports weight gain since the loss of her husband.  No abdominal bloating, no changes in bowel or rectal habits.    Review of Systems:  Constitutional  Feels well, Cardiovascular  No chest pain, shortness of breath, or edema  Pulmonary  No cough or wheeze.  Gastro Intestinal  No nausea, vomitting, or diarrhoea. No bright red blood per rectum, no abdominal pain, change in bowel movement, or constipation.  Genito Urinary  No frequency, urgency, dysuria,severe vaginal atrophy since chemotherapy.  Current vaginal discharge. Musculo Skeletal  No myalgia, arthralgia, joint swelling or pain  Neurologic  No weakness, numbness, change in gait,  Psychology  No depression, anxiety, insomnia. Sadness since the death of her husband    Current Meds:  Outpatient Encounter Prescriptions as of 02/09/2014  Medication Sig  . Calcium-Magnesium-Vitamin D (CALCIUM 500 PO) Take by mouth daily.  . Coenzyme Q10 (COQ10 PO) Take by mouth daily.  . Cyanocobalamin (VITAMIN B 12 PO) Take by mouth.  . denosumab (PROLIA) 60 MG/ML SOLN injection Inject 60 mg into the skin every 6 (six) months. Administer in upper arm, thigh, or abdomen  . Flaxseed, Linseed, (FLAX SEEDS PO) Take by mouth daily.  . Glucosamine HCl (GLUCOSAMINE PO) Take by mouth daily.  Marland Kitchen MELATONIN PO Take by mouth daily.  . NON FORMULARY mag powder  . Omega-3 Fatty Acids (FISH OIL PO) Take by mouth daily.  . raloxifene (EVISTA) 60 MG tablet Take 1 tablet (60 mg total) by mouth daily.  Marland Kitchen VITAMIN D, CHOLECALCIFEROL, PO Take 4,000 Int'l Units by mouth daily.    Allergy: No Known Allergies  Social Hx:   History  Social History  . Marital Status: Married    Spouse Name: N/A    Number of Children: 1  . Years of Education: N/A   Occupational History  . Not on file.   Social History Main Topics  . Smoking status: Never Smoker   . Smokeless tobacco: Never Used  . Alcohol  Use: No  . Drug Use: No  . Sexual Activity: No   Other Topics Concern  . Not on file   Social History Narrative  Husband died 2013/08/06.  Plans to spend Thanksgiving out of town with her son  Past Surgical History  Procedure Laterality Date  . Excision vaginal cyst    . Breast implant removal  6/94    left breast  . Cataract extraction      left  . Mastectomy  10/08/06    right  . Radical mastectomy lnd  1990    left  . Breast reconstruction  1990    left     Past Medical Hx:  Past Medical History  Diagnosis Date  . Osteoporosis     on Evista  . Breast cancer     bilateral mastectomy, adenoca breast-left MRM, reconstruction, chemo  . Cystitis     cytoxen  . Vasovagal syncopes   . Macular degeneration   . Anemia   . Uterine fibroid     Past Gynecological History: G1P1 Menarche 29 . Long history of irregular and heavy mesnses.  Never used birth control.  o h/o abnormal pap test Patient's last menstrual period was 03/31/1988.  Family Hx:  Family History  Problem Relation Age of Onset  . Breast cancer Sister 5    recurrence age 27  . Breast cancer Paternal Grandmother   . Heart disease Mother   . Heart disease Father   . Hypertension Sister   Maternal aunt breast cancer dx 68y/o Paternal grandmother breast cancer age at dx ?  Vitals:  Blood pressure 133/81, pulse 75, temperature 98.3 F (36.8 C), temperature source Oral, resp. rate 18, height 5' (1.524 m), weight 133 lb 4.8 oz (60.464 kg), last menstrual period 03/31/1988. Body mass index is 26.03 kg/(m^2).   Physical Exam: WD in NAD Neck  Supple NROM, without any enlargements.  Lymph Node Survey No cervical supraclavicular or inguinal adenopathy Cardiovascular  Pulse normal rate, regularity and rhythm. S1 and S2 normal.  Lungs  Clear to auscultation bilaterally, Good air movement. Breasts absent bilaterally Psychiatry  Alert and oriented appropriate mood affect speech and reasoning. Abdomen   Normoactive bowel sounds, abdomen soft, non-tender. No palpable omental masses or ascites Back No CVA tenderness Genito Urinary  Vulva/vagina: Normal external female genitalia.  No lesions.   Bladder/urethra:  No lesions or masses  Vagina: markedly atrophic, discharge present Cervix: Normal appearing, 3 cm no lesions.well supported.  Uterus: Mobile, no parametrial involvement or nodularity.  Adnexa: No palpable masses. Rectal  Good tone, no masses no cul de sac nodularity.  Extremities  No bilateral cyanosis, clubbing or edema.   Janie Morning, MD, PhD 02/09/2014, 11:40 AM

## 2014-02-09 NOTE — Patient Instructions (Addendum)
Preparing for your Surgery  Plan for surgery on Dec 8 with Dr. Nancy Marus  Pre-operative Testing -You will receive a phone call from presurgical testing at [facility] to arrange for a pre-operative testing appointment before your surgery.  This appointment normally occurs one to two weeks before your scheduled surgery.   -Bring your insurance card, copy of an advanced directive if applicable, medication list  -At that visit, you will be asked to sign a consent for a possible blood transfusion in case a transfusion becomes necessary during surgery.  The need for a blood transfusion is rare but having consent is a necessary part of your care.     -You should not be taking blood thinners or aspirin at least ten days prior to surgery unless instructed by your surgeon.  Day Before Surgery at [facility] will be asked to take in only clear liquids the day before surgery.  Examples of clear liquids include broths, jello, and clear juices.  You may also be advised to perform a Miralax bowel prep or fleets enema the night before your surgery based off of your provider's recommendations.  You will be advised to have nothing to eat or drink after midnight the evening before.    Your role in recovery Your role is to become active as soon as directed by your doctor, while still giving yourself time to heal.  Rest when you feel tired. You will be asked to do the following in order to speed your recovery:  - Cough and breathe deeply. This helps toclear and expand your lungs and can prevent pneumonia. You may be given a spirometer to practice deep breathing. A staff member will show you how to use the spirometer. - Do mild physical activity. Walking or moving your legs help your circulation and body functions return to normal. A staff member will help you when you try to walk and will provide you with simple exercises. Do not try to get up or walk alone the first time. - Actively manage  your pain. Managing your pain lets you move in comfort. We will ask you to rate your pain on a scale of zero to 10. It is your responsibility to tell your doctor or nurse where and how much you hurt so your pain can be treated.  Special Considerations -If you are diabetic, you may be placed on insulin after surgery to have closer control over your blood sugars to promote healing and recovery.  This does not mean that you will be discharged on insulin.  If applicable, your oral antidiabetics will be resumed when you are tolerating a solid diet.  -Your final pathology results from surgery should be available by the Friday after surgery and the results will be relayed to you when available.  Abdominal Hysterectomy Abdominal hysterectomy is a surgical procedure to remove your womb (uterus). Your uterus is the muscular organ that contains a developing baby. This surgery is done for many reasons. You may need an abdominal hysterectomy if you have cancer, growths (tumors), long-term pain, or bleeding. You may also have this procedure if your uterus has slipped down into your vagina (uterine prolapse). Depending on why you need an abdominal hysterectomy, you may also have other reproductive organs removed. These could include the part of your vagina that connects with your uterus (cervix), the organs that make eggs (ovaries), and the tubes that connect the ovaries to the uterus (fallopian tubes). LET [facility] CARE PROVIDER KNOW ABOUT:  Any allergies you have.  All medicines you are taking, including vitamins, herbs, eye drops, creams, and over-the-counter medicines.  Previous problems you or members of your family have had with the use of anesthetics.  Any blood disorders you have.  Previous surgeries you have had.  Medical conditions you have. RISKS AND COMPLICATIONS Generally, this is a safe procedure. However, as with any procedure, problems can occur. Infection is the most common problem  after an abdominal hysterectomy. Other possible problems include:  Bleeding.  Formation of blood clots that may break free and travel to your lungs.  Injury to other organs near your uterus.  Nerve injury causing nerve pain.  Decreased interest in sex or pain during sexual intercourse. BEFORE THE PROCEDURE  Abdominal hysterectomy is a major surgical procedure. It can affect the way you feel about yourself. Talk to your health care provider about the physical and emotional changes hysterectomy may cause.  You may need to have blood work and X-rays done before surgery.  Quit smoking if you smoke. Ask your health care provider for help if you are struggling to quit.  Stop taking medicines that thin your blood as directed by your health care provider.  You may be instructed to take antibiotic medicines or laxatives before surgery.  Do not eat or drink anything for 6-8 hours before surgery.  Take your regular medicines with a small sip of water.  Bathe or shower the night or morning before surgery. PROCEDURE  Abdominal hysterectomy is done in the operating room at the hospital.  In most cases, you will be given a medicine that makes you go to sleep (general anesthetic).  The surgeon will make a cut (incision) through the skin in your lower belly.  The incision may be about 5-7 inches long. It may go side-to-side or up-and-down.  The surgeon will move aside the body tissue that covers your uterus. The surgeon will then carefully take out your uterus along with any of your other reproductive organs that need to be removed.  Bleeding will be controlled with clamps or sutures.  The surgeon will close your incision with sutures or metal clips. AFTER THE PROCEDURE  You will have some pain immediately after the procedure.  You will be given pain medicine in the recovery room.  You will be taken to your hospital room when you have recovered from the anesthesia.  You may need to  stay in the hospital for 2-5 days.  You will be given instructions for recovery at home. Document Released: 03/22/2013 Document Reviewed: 03/22/2013 [facility] Patient Information 2015 [place], [place]. This information is not intended to replace advice given to you by your health care provider. Make sure you discuss any questions you have with your health care provider.

## 2014-02-10 DIAGNOSIS — R293 Abnormal posture: Secondary | ICD-10-CM | POA: Diagnosis not present

## 2014-02-10 DIAGNOSIS — M542 Cervicalgia: Secondary | ICD-10-CM | POA: Diagnosis not present

## 2014-02-10 DIAGNOSIS — M25521 Pain in right elbow: Secondary | ICD-10-CM | POA: Diagnosis not present

## 2014-02-10 DIAGNOSIS — M79641 Pain in right hand: Secondary | ICD-10-CM | POA: Diagnosis not present

## 2014-02-15 DIAGNOSIS — M25521 Pain in right elbow: Secondary | ICD-10-CM | POA: Diagnosis not present

## 2014-02-15 DIAGNOSIS — R293 Abnormal posture: Secondary | ICD-10-CM | POA: Diagnosis not present

## 2014-02-15 DIAGNOSIS — M542 Cervicalgia: Secondary | ICD-10-CM | POA: Diagnosis not present

## 2014-02-15 DIAGNOSIS — M79641 Pain in right hand: Secondary | ICD-10-CM | POA: Diagnosis not present

## 2014-02-17 DIAGNOSIS — M47812 Spondylosis without myelopathy or radiculopathy, cervical region: Secondary | ICD-10-CM | POA: Diagnosis not present

## 2014-02-17 DIAGNOSIS — M810 Age-related osteoporosis without current pathological fracture: Secondary | ICD-10-CM | POA: Diagnosis not present

## 2014-02-20 DIAGNOSIS — R293 Abnormal posture: Secondary | ICD-10-CM | POA: Diagnosis not present

## 2014-02-20 DIAGNOSIS — M25521 Pain in right elbow: Secondary | ICD-10-CM | POA: Diagnosis not present

## 2014-02-20 DIAGNOSIS — M542 Cervicalgia: Secondary | ICD-10-CM | POA: Diagnosis not present

## 2014-02-20 DIAGNOSIS — M79641 Pain in right hand: Secondary | ICD-10-CM | POA: Diagnosis not present

## 2014-02-21 DIAGNOSIS — J209 Acute bronchitis, unspecified: Secondary | ICD-10-CM | POA: Diagnosis not present

## 2014-02-27 DIAGNOSIS — R293 Abnormal posture: Secondary | ICD-10-CM | POA: Diagnosis not present

## 2014-02-27 DIAGNOSIS — M542 Cervicalgia: Secondary | ICD-10-CM | POA: Diagnosis not present

## 2014-02-27 DIAGNOSIS — M25521 Pain in right elbow: Secondary | ICD-10-CM | POA: Diagnosis not present

## 2014-02-27 DIAGNOSIS — M79641 Pain in right hand: Secondary | ICD-10-CM | POA: Diagnosis not present

## 2014-02-28 DIAGNOSIS — K219 Gastro-esophageal reflux disease without esophagitis: Secondary | ICD-10-CM | POA: Diagnosis not present

## 2014-02-28 DIAGNOSIS — Z0000 Encounter for general adult medical examination without abnormal findings: Secondary | ICD-10-CM | POA: Diagnosis not present

## 2014-02-28 DIAGNOSIS — E780 Pure hypercholesterolemia: Secondary | ICD-10-CM | POA: Diagnosis not present

## 2014-02-28 DIAGNOSIS — M810 Age-related osteoporosis without current pathological fracture: Secondary | ICD-10-CM | POA: Diagnosis not present

## 2014-02-28 DIAGNOSIS — Z79899 Other long term (current) drug therapy: Secondary | ICD-10-CM | POA: Diagnosis not present

## 2014-02-28 DIAGNOSIS — Z1389 Encounter for screening for other disorder: Secondary | ICD-10-CM | POA: Diagnosis not present

## 2014-02-28 DIAGNOSIS — Z23 Encounter for immunization: Secondary | ICD-10-CM | POA: Diagnosis not present

## 2014-02-28 DIAGNOSIS — E041 Nontoxic single thyroid nodule: Secondary | ICD-10-CM | POA: Diagnosis not present

## 2014-02-28 NOTE — Patient Instructions (Addendum)
ALTHEA BACKS  02/28/2014   Your procedure is scheduled on: Tuesday December 8th, 2015  Report to [place]   Entrance and follow signs to               [place] at 845 AM.  Call this number if you have problems the morning of surgery [phone]   Remember:  Do not eat food  :After Midnight Sunday night, clear liquids all day Monday December 7th, no clear liquids after midnight Monday night.     Take these medicines the morning of surgery with A SIP OF WATER: no meds to take                              You may not have any metal on your body including hair pins and              piercings  Do not wear jewelry, make-up, lotions, powders or perfumes.             Do not wear nail polish.  Do not shave  48 hours prior to surgery.              Men may shave face and neck.   Do not bring valuables to the hospital. [place].  Contacts, dentures or bridgework may not be worn into surgery.  Leave suitcase in the car. After surgery it may be brought to your room.     Patients discharged the day of surgery will not be allowed to drive home.  Name and phone number of your driver:  Special Instructions: N/A              Please read over the following fact sheets you were given: _____________________________________________________________________             [facility] - Preparing for Surgery Before surgery, you can play an important role.  Because skin is not sterile, your skin needs to be as free of germs as possible.  You can reduce the number of germs on your skin by washing with CHG (chlorahexidine gluconate) soap before surgery.  CHG is an antiseptic cleaner which kills germs and bonds with the skin to continue killing germs even after washing. Please DO NOT use if you have an allergy to CHG or antibacterial soaps.  If your skin becomes reddened/irritated stop using the CHG and inform  your nurse when you arrive at Short Stay. Do not shave (including legs and underarms) for at least 48 hours prior to the first CHG shower.  You may shave your face/neck. Please follow these instructions carefully:  1.  Shower with CHG Soap the night before surgery and the  morning of Surgery.  2.  If you choose to wash your hair, wash your hair first as usual with your  normal  shampoo.  3.  After you shampoo, rinse your hair and body thoroughly to remove the  shampoo.                           4.  Use CHG as you would any other liquid soap.  You can apply chg directly  to the skin and wash  Gently with a scrungie or clean washcloth.  5.  Apply the CHG Soap to your body ONLY FROM THE NECK DOWN.   Do not use on face/ open                           Wound or open sores. Avoid contact with eyes, ears mouth and genitals (private parts).                       Wash face,  Genitals (private parts) with your normal soap.             6.  Wash thoroughly, paying special attention to the area where your surgery  will be performed.  7.  Thoroughly rinse your body with warm water from the neck down.  8.  DO NOT shower/wash with your normal soap after using and rinsing off  the CHG Soap.                9.  Pat yourself dry with a clean towel.            10.  Wear clean pajamas.            11.  Place clean sheets on your bed the night of your first shower and do not  sleep with pets. Day of Surgery : Do not apply any lotions/deodorants the morning of surgery.  Please wear clean clothes to the hospital/surgery center.  FAILURE TO FOLLOW THESE INSTRUCTIONS MAY RESULT IN THE CANCELLATION OF YOUR SURGERY PATIENT SIGNATURE_________________________________  NURSE SIGNATURE__________________________________  ________________________________________________________________________    CLEAR LIQUID DIET   Foods Allowed                                                                      Foods Excluded  Coffee and tea, regular and decaf                             liquids that you cannot  Plain Jell-O in any flavor                                             see through such as: Fruit ices (not with fruit pulp)                                     milk, soups, orange juice  Iced Popsicles                                    All solid food Carbonated beverages, regular and diet                                    Cranberry, grape and apple juices Sports drinks like Gatorade Lightly seasoned clear broth or consume(fat free) Sugar, honey syrup  Sample Menu Breakfast                                Lunch                                     Supper Cranberry juice                    Beef broth                            Chicken broth Jell-O                                     Grape juice                           Apple juice Coffee or tea                        Jell-O                                      Popsicle                                                Coffee or tea                        Coffee or tea  _____________________________________________________________________    Incentive Spirometer  An incentive spirometer is a tool that can help keep your lungs clear and active. This tool measures how well you are filling your lungs with each breath. Taking long deep breaths may help reverse or decrease the chance of developing breathing (pulmonary) problems (especially infection) following:  A long period of time when you are unable to move or be active. BEFORE THE PROCEDURE   If the spirometer includes an indicator to show your best effort, your nurse or respiratory therapist will set it to a desired goal.  If possible, sit up straight or lean slightly forward. Try not to slouch.  Hold the incentive spirometer in an upright position. INSTRUCTIONS FOR USE   Sit on the edge of your bed if possible, or sit up as far as you can in bed or on a chair.  Hold the  incentive spirometer in an upright position.  Breathe out normally.  Place the mouthpiece in your mouth and seal your lips tightly around it.  Breathe in slowly and as deeply as possible, raising the piston or the ball toward the top of the column.  Hold your breath for 3-5 seconds or for as long as possible. Allow the piston or ball to fall to the bottom of the column.  Remove the mouthpiece from your mouth and breathe out normally.  Rest for a few seconds and repeat Steps 1 through 7 at least 10 times every 1-2 hours when you are awake. Take your time and take a few normal breaths between  deep breaths.  The spirometer may include an indicator to show your best effort. Use the indicator as a goal to work toward during each repetition.  After each set of 10 deep breaths, practice coughing to be sure your lungs are clear. If you have an incision (the cut made at the time of surgery), support your incision when coughing by placing a pillow or rolled up towels firmly against it. Once you are able to get out of bed, walk around indoors and cough well. You may stop using the incentive spirometer when instructed by your caregiver.  RISKS AND COMPLICATIONS  Take your time so you do not get dizzy or light-headed.  If you are in pain, you may need to take or ask for pain medication before doing incentive spirometry. It is harder to take a deep breath if you are having pain. AFTER USE  Rest and breathe slowly and easily.  It can be helpful to keep track of a log of your progress. Your caregiver can provide you with a simple table to help with this. If you are using the spirometer at home, follow these instructions: [place] IF:   You are having difficultly using the spirometer.  You have trouble using the spirometer as often as instructed.  Your pain medication is not giving enough relief while using the spirometer.  You develop fever of 100.5 F (38.1 C) or higher. SEEK  IMMEDIATE MEDICAL CARE IF:   You cough up bloody sputum that had not been present before.  You develop fever of 102 F (38.9 C) or greater.  You develop worsening pain at or near the incision site. MAKE SURE YOU:   Understand these instructions.  Will watch your condition.  Will get help right away if you are not doing well or get worse. Document Released: 07/28/2006 Document Revised: 06/09/2011 Document Reviewed: 09/28/2006 Exit[employer] Patient Information 2014 ExitCare, [place].   ________________________________________________________________________  WHAT IS A BLOOD TRANSFUSION? Blood Transfusion Information  A transfusion is the replacement of blood or some of its parts. Blood is made up of multiple cells which provide different functions.  Red blood cells carry oxygen and are used for blood loss replacement.  White blood cells fight against infection.  Platelets control bleeding.  Plasma helps clot blood.  Other blood products are available for specialized needs, such as hemophilia or other clotting disorders. BEFORE THE TRANSFUSION  Who gives blood for transfusions?   Healthy volunteers who are fully evaluated to make sure their blood is safe. This is blood bank blood. Transfusion therapy is the safest it has ever been in the practice of medicine. Before blood is taken from a donor, a complete history is taken to make sure that person has no history of diseases nor engages in risky social behavior (examples are intravenous drug use or sexual activity with multiple partners). The donor's travel history is screened to minimize risk of transmitting infections, such as malaria. The donated blood is tested for signs of infectious diseases, such as HIV and hepatitis. The blood is then tested to be sure it is compatible with you in order to minimize the chance of a transfusion reaction. If you or a relative donates blood, this is often done in anticipation of surgery and is not  appropriate for emergency situations. It takes many days to process the donated blood. RISKS AND COMPLICATIONS Although transfusion therapy is very safe and saves many lives, the main dangers of transfusion include:   Getting an infectious disease.  Developing a transfusion reaction. This is an allergic reaction to something in the blood you were given. Every precaution is taken to prevent this. The decision to have a blood transfusion has been considered carefully by your caregiver before blood is given. Blood is not given unless the benefits outweigh the risks. AFTER THE TRANSFUSION  Right after receiving a blood transfusion, you will usually feel much better and more energetic. This is especially true if your red blood cells have gotten low (anemic). The transfusion raises the level of the red blood cells which carry oxygen, and this usually causes an energy increase.  The nurse administering the transfusion will monitor you carefully for complications. HOME CARE INSTRUCTIONS  No special instructions are needed after a transfusion. You may find your energy is better. Speak with your caregiver about any limitations on activity for underlying diseases you may have. SEEK MEDICAL CARE IF:   Your condition is not improving after your transfusion.  You develop redness or irritation at the intravenous (IV) site. SEEK IMMEDIATE MEDICAL CARE IF:  Any of the following symptoms occur over the next 12 hours:  Shaking chills.  You have a temperature by mouth above 102 F (38.9 C), not controlled by medicine.  Chest, back, or muscle pain.  People around you feel you are not acting correctly or are confused.  Shortness of breath or difficulty breathing.  Dizziness and fainting.  You get a rash or develop hives.  You have a decrease in urine output.  Your urine turns a dark color or changes to pink, red, or brown. Any of the following symptoms occur over the next 10 days:  You have a  temperature by mouth above 102 F (38.9 C), not controlled by medicine.  Shortness of breath.  Weakness after normal activity.  The white part of the eye turns yellow (jaundice).  You have a decrease in the amount of urine or are urinating less often.  Your urine turns a dark color or changes to pink, red, or brown. Document Released: 03/14/2000 Document Revised: 06/09/2011 Document Reviewed: 11/01/2007 [facility] Patient Information 2014 [place], [place].  _______________________________________________________________________

## 2014-03-01 ENCOUNTER — Encounter (HOSPITAL_COMMUNITY)
Admission: RE | Admit: 2014-03-01 | Discharge: 2014-03-01 | Disposition: A | Discharge status: Home / Self Care | Payer: Medicare Other | Source: Ambulatory Visit | Attending: Gynecologic Oncology | Admitting: Gynecologic Oncology

## 2014-03-01 ENCOUNTER — Ambulatory Visit (HOSPITAL_COMMUNITY)
Admission: RE | Admit: 2014-03-01 | Discharge: 2014-03-01 | Disposition: A | Discharge status: Home / Self Care | Payer: Medicare Other | Source: Ambulatory Visit | Attending: Gynecologic Oncology | Admitting: Gynecologic Oncology

## 2014-03-01 ENCOUNTER — Other Ambulatory Visit: Payer: Self-pay | Admitting: Geriatric Medicine

## 2014-03-01 ENCOUNTER — Encounter (HOSPITAL_COMMUNITY): Payer: Self-pay

## 2014-03-01 DIAGNOSIS — Z9012 Acquired absence of left breast and nipple: Secondary | ICD-10-CM | POA: Insufficient documentation

## 2014-03-01 DIAGNOSIS — E041 Nontoxic single thyroid nodule: Secondary | ICD-10-CM

## 2014-03-01 DIAGNOSIS — Z01818 Encounter for other preprocedural examination: Secondary | ICD-10-CM | POA: Diagnosis not present

## 2014-03-01 DIAGNOSIS — Z853 Personal history of malignant neoplasm of breast: Secondary | ICD-10-CM

## 2014-03-01 HISTORY — DX: Bronchitis, not specified as acute or chronic: J40

## 2014-03-01 HISTORY — DX: Other specified postprocedural states: Z98.890

## 2014-03-01 HISTORY — DX: Cervicalgia: M54.2

## 2014-03-01 HISTORY — DX: Pain in unspecified shoulder: M25.519

## 2014-03-01 HISTORY — DX: Other complications of anesthesia, initial encounter: T88.59XA

## 2014-03-01 HISTORY — DX: Other specified postprocedural states: R11.2

## 2014-03-01 HISTORY — DX: Adverse effect of unspecified anesthetic, initial encounter: T41.45XA

## 2014-03-01 LAB — CBC WITH DIFFERENTIAL/PLATELET
Basophils Absolute: 0.1 10*3/uL (ref 0.0–0.1)
Basophils Relative: 1 % (ref 0–1)
EOS ABS: 0.4 10*3/uL (ref 0.0–0.7)
Eosinophils Relative: 4 % (ref 0–5)
HEMATOCRIT: 40.6 % (ref 36.0–46.0)
HEMOGLOBIN: 13.3 g/dL (ref 12.0–15.0)
LYMPHS ABS: 3.1 10*3/uL (ref 0.7–4.0)
Lymphocytes Relative: 31 % (ref 12–46)
MCH: 30.6 pg (ref 26.0–34.0)
MCHC: 32.8 g/dL (ref 30.0–36.0)
MCV: 93.3 fL (ref 78.0–100.0)
MONO ABS: 0.9 10*3/uL (ref 0.1–1.0)
MONOS PCT: 9 % (ref 3–12)
NEUTROS PCT: 55 % (ref 43–77)
Neutro Abs: 5.7 10*3/uL (ref 1.7–7.7)
Platelets: 305 10*3/uL (ref 150–400)
RBC: 4.35 MIL/uL (ref 3.87–5.11)
RDW: 12.8 % (ref 11.5–15.5)
WBC: 10.1 10*3/uL (ref 4.0–10.5)

## 2014-03-01 LAB — ABO/RH: ABO/RH(D): A NEG

## 2014-03-01 LAB — URINALYSIS, ROUTINE W REFLEX MICROSCOPIC
BILIRUBIN URINE: NEGATIVE
Glucose, UA: NEGATIVE mg/dL
HGB URINE DIPSTICK: NEGATIVE
Ketones, ur: NEGATIVE mg/dL
Nitrite: NEGATIVE
PH: 7 (ref 5.0–8.0)
Protein, ur: NEGATIVE mg/dL
Specific Gravity, Urine: 1.008 (ref 1.005–1.030)
Urobilinogen, UA: 0.2 mg/dL (ref 0.0–1.0)

## 2014-03-01 LAB — COMPREHENSIVE METABOLIC PANEL
ALK PHOS: 53 U/L (ref 39–117)
ALT: 20 U/L (ref 0–35)
ANION GAP: 13 (ref 5–15)
AST: 22 U/L (ref 0–37)
Albumin: 3.7 g/dL (ref 3.5–5.2)
BUN: 14 mg/dL (ref 6–23)
CHLORIDE: 101 meq/L (ref 96–112)
CO2: 27 mEq/L (ref 19–32)
Calcium: 9.8 mg/dL (ref 8.4–10.5)
Creatinine, Ser: 0.82 mg/dL (ref 0.50–1.10)
GFR calc non Af Amer: 69 mL/min — ABNORMAL LOW (ref >90)
GFR, EST AFRICAN AMERICAN: 80 mL/min — AB (ref >90)
GLUCOSE: 104 mg/dL — AB (ref 70–99)
Potassium: 4.8 mEq/L (ref 3.7–5.3)
Sodium: 141 mEq/L (ref 137–147)
TOTAL PROTEIN: 7.6 g/dL (ref 6.0–8.3)

## 2014-03-01 LAB — URINE MICROSCOPIC-ADD ON

## 2014-03-02 DIAGNOSIS — M542 Cervicalgia: Secondary | ICD-10-CM | POA: Diagnosis not present

## 2014-03-02 DIAGNOSIS — M79641 Pain in right hand: Secondary | ICD-10-CM | POA: Diagnosis not present

## 2014-03-02 DIAGNOSIS — R293 Abnormal posture: Secondary | ICD-10-CM | POA: Diagnosis not present

## 2014-03-02 DIAGNOSIS — M25521 Pain in right elbow: Secondary | ICD-10-CM | POA: Diagnosis not present

## 2014-03-02 NOTE — Progress Notes (Signed)
03-02-14 1020 AM Left message on pt voice mail,making  pt aware of surgery time change to 1015 AM, please arrive by 0800 AM -all other instructions unchanged.

## 2014-03-03 ENCOUNTER — Ambulatory Visit: Payer: Medicare Other | Admitting: Podiatrist

## 2014-03-03 DIAGNOSIS — R293 Abnormal posture: Secondary | ICD-10-CM | POA: Diagnosis not present

## 2014-03-03 DIAGNOSIS — M25521 Pain in right elbow: Secondary | ICD-10-CM | POA: Diagnosis not present

## 2014-03-03 DIAGNOSIS — M79641 Pain in right hand: Secondary | ICD-10-CM | POA: Diagnosis not present

## 2014-03-03 DIAGNOSIS — M542 Cervicalgia: Secondary | ICD-10-CM | POA: Diagnosis not present

## 2014-03-06 ENCOUNTER — Telehealth: Payer: Self-pay | Admitting: Gynecologic Oncology

## 2014-03-06 NOTE — Telephone Encounter (Signed)
Patient called this am stating she wanted to cancel her surgery tomorrow.  "I am still suffering with bronchitis and I feel if I have surgery I am asking for trouble.  Dr. Alycia Rossetti notified.  Surgery will be rescheduled for Dec 22 with Dr. Skeet Latch.  She will come in for pre-op on Dec 16 or 17.  Advised to call for any questions or concerns.

## 2014-03-07 LAB — TYPE AND SCREEN
ABO/RH(D): A NEG
Antibody Screen: NEGATIVE

## 2014-03-15 ENCOUNTER — Ambulatory Visit: Payer: Medicare Other | Attending: Gynecologic Oncology | Admitting: Gynecologic Oncology

## 2014-03-15 ENCOUNTER — Encounter: Payer: Self-pay | Admitting: Gynecologic Oncology

## 2014-03-15 VITALS — BP 150/85 | HR 82 | Temp 98.1°F | Resp 18 | Ht 60.0 in | Wt 132.8 lb

## 2014-03-15 DIAGNOSIS — Z9010 Acquired absence of unspecified breast and nipple: Secondary | ICD-10-CM | POA: Diagnosis not present

## 2014-03-15 DIAGNOSIS — Z9221 Personal history of antineoplastic chemotherapy: Secondary | ICD-10-CM | POA: Diagnosis not present

## 2014-03-15 DIAGNOSIS — N8501 Benign endometrial hyperplasia: Secondary | ICD-10-CM | POA: Insufficient documentation

## 2014-03-15 DIAGNOSIS — Z853 Personal history of malignant neoplasm of breast: Secondary | ICD-10-CM | POA: Diagnosis not present

## 2014-03-15 DIAGNOSIS — Z79899 Other long term (current) drug therapy: Secondary | ICD-10-CM | POA: Diagnosis not present

## 2014-03-15 DIAGNOSIS — R252 Cramp and spasm: Secondary | ICD-10-CM | POA: Diagnosis not present

## 2014-03-15 DIAGNOSIS — N898 Other specified noninflammatory disorders of vagina: Secondary | ICD-10-CM | POA: Diagnosis not present

## 2014-03-15 DIAGNOSIS — Z780 Asymptomatic menopausal state: Secondary | ICD-10-CM | POA: Diagnosis not present

## 2014-03-15 DIAGNOSIS — N8502 Endometrial intraepithelial neoplasia [EIN]: Secondary | ICD-10-CM

## 2014-03-15 DIAGNOSIS — Z171 Estrogen receptor negative status [ER-]: Secondary | ICD-10-CM | POA: Insufficient documentation

## 2014-03-15 NOTE — Progress Notes (Signed)
Consult Note: Gyn-Onc  Consult was requested by Dr. Charlies Constable for the evaluation of Beth Simon 73 y.o. female  CC: Endometrial complex atypical hyperplasia  Assessment/Plan:  [name]  is a 73 y.o.  With CAH.  Recommendation was made for hysterectomy BSO  With staging as indicated based on frozen section findings.    I extensively reviewed the risks of robotic hysterectomy bilateral salpingooophorectomy and possible lymph node dissection of infection, bleeding, damage to nearby organs including bowel, bladder, vessels, nerves, and ureters. We discussed postoperative risks including infection, PE/ DVT, and lymphedema. We reviewed possible need for conversion to open laparotomy, need for possible blood transfusion. I discussed positioning during surgery of trendelenberg and risks of minor facial swelling and care we take in preoperative positioning. We also reviewed possible need for further treatment such as radiation or chemotherapy based on final pathology. Expected postoperative recovery was also discussed.  Her procedure was originally scheduled for the last week that she felt like she had bronchitis and we scheduled for this coming Tuesday with Dr. Skeet Latch she had initially met in the new patient.  All of her questions were answered to her satisfaction.  Patient is aware that given her personal and family history genetic counseling will be arranged post -operatively.  Will likely request IHC staining if malignancy is identified.   HPI: [name]  is a 73 y.o.   G1 P1 menopausal.  Noted irregular pelvic cramping after the death of her husband.  Over the past two months she noticed vaginal discharge.  Vaginal ultrasound 01/10/2014 endometrial stripe 14.7mm.  Endometrial mass with cystic changes noted 2x1 cm.  Atrophic ovaries.  Uterine size 6.46x4.21 cm.  EMB 01/10/2014 Complex atypical hyperplasia.  History notable for left breast cancer ER-PR-  treated with radical  mastectomy and chemotherapy in 1990.  Subsequently underwent right simple mastectomy 2008 without reconstruction. Denies use of tamoxifen  Reports weight gain since the loss of her husband.  No abdominal bloating, no changes in bowel or rectal habits.    Interval history: She was scheduled for surgery on December 8 however she called and canceled secondary to feeling like she had a cold, bronchitis and feeling run down. She saw her primary care physician last Thursday was prescribed Advair which is improved her symptoms. She continues to complain of some mucus congestion. She does have a cough with white mucus. She's been afebrile. She does feel that her ears are little bit stopped up. She'll review all feels "knocked out". She did have her flu shot as well as her Pneumovax vaccination 6 year. It has been a difficult year for her. Her husband passed away in 2022-08-20. She traveled to [place] to see a sister who had Alzheimer's and now this diagnosis. She denies any shortness of breath or chest pain.  Review of Systems: As above   Current Meds:  Outpatient Encounter Prescriptions as of 03/15/2014  Medication Sig  . ADVAIR DISKUS 100-50 MCG/DOSE AEPB Inhale 1 puff into the lungs 2 (two) times daily.   . Coenzyme Q10 (COQ10 PO) Take 1 tablet by mouth every morning.   . Cyanocobalamin (VITAMIN B 12 PO) Take 1 tablet by mouth every morning.   . denosumab (PROLIA) 60 MG/ML SOLN injection Inject 60 mg into the skin every 6 (six) months. Administer in upper arm, thigh, or abdomen  . Flaxseed, Linseed, (FLAX SEEDS PO) Take 1 scoop by mouth every morning. Ground flaxseed in cereal.  . Glucosamine HCl (GLUCOSAMINE  PO) Take 1 tablet by mouth every morning.   Marland Kitchen guaiFENesin (MUCINEX) 600 MG 12 hr tablet Take 600 mg by mouth 2 (two) times daily as needed.  Marland Kitchen MELATONIN PO Take 1 tablet by mouth at bedtime as needed (sleep.).   Marland Kitchen Omega-3 Fatty Acids (FISH OIL PO) Take 1 capsule by mouth every morning.   Marland Kitchen OVER THE  COUNTER MEDICATION Take 1 tablet by mouth every morning. Astazanthin. (anti-oxidant)  . raloxifene (EVISTA) 60 MG tablet Take 1 tablet (60 mg total) by mouth daily. (Patient taking differently: Take 60 mg by mouth every morning. )  . VITAMIN D, CHOLECALCIFEROL, PO Take 4,000 Int'l Units by mouth every morning.   . [DISCONTINUED] benzonatate (TESSALON) 100 MG capsule     Allergy:  Allergies  Allergen Reactions  . Novocain [Procaine] Other (See Comments)    Heart palpitations    Social Hx:   History   Social History  . Marital Status: Widowed    Spouse Name: N/A    Number of Children: 1  . Years of Education: N/A   Occupational History  . Not on file.   Social History Main Topics  . Smoking status: Never Smoker   . Smokeless tobacco: Never Used  . Alcohol Use: No  . Drug Use: No  . Sexual Activity: No   Other Topics Concern  . Not on file   Social History Narrative  Husband died 08/28/2013.  Plans to spend Thanksgiving out of town with her son  Past Surgical History  Procedure Laterality Date  . Excision vaginal cyst    . Breast implant removal  6/94    left breast  . Cataract extraction Bilateral   . Radical mastectomy lnd  1990    left, chemo done  . Breast reconstruction  1990    left  . Mastectomy  10/08/06    right  . Mastectomy Right 2008    prophalactic     Past Medical Hx:  Past Medical History  Diagnosis Date  . Osteoporosis     on Evista  . Cystitis     cytoxen, had once or twice  . Vasovagal syncopes   . Macular degeneration   . Anemia   . Uterine fibroid   . Complication of anesthesia   . PONV (postoperative nausea and vomiting)   . Breast cancer 1990    bilateral mastectomy, adenoca breast-left MRM, reconstruction, chemo  . Bronchitis last 2 weeks    saw dr Felipa Eth 02-28-2014, he said no antibiotics needed, nonproductive cough  . Shoulder pain     taking physical therapy for last few weeks  . Neck pain     taking physictal therapy last  2 weeks    Past Gynecological History: G1P1 Menarche 83 . Long history of irregular and heavy mesnses.  Never used birth control.  o h/o abnormal pap test Patient's last menstrual period was 03/31/1988.  Family Hx:  Family History  Problem Relation Age of Onset  . Breast cancer Sister 56    recurrence age 27  . Breast cancer Paternal Grandmother   . Heart disease Mother   . Heart disease Father   . Hypertension Sister   Maternal aunt breast cancer dx 68y/o Paternal grandmother breast cancer age at dx ?  Vitals:  Blood pressure 150/85, pulse 82, temperature 98.1 F (36.7 C), temperature source Oral, resp. rate 18, height 5' (1.524 m), weight 132 lb 12.8 oz (60.238 kg), last menstrual period 03/31/1988. Body mass index is 25.94  kg/(m^2).   Physical Exam: WD in NAD  Cardiovascular:  Pulse normal rate, regularity and rhythm. S1 and S2 normal. Audible click in the mitral position  Lungs: Clear to auscultation bilaterally, Good air movement. No wheezing, no rhonchi    Preslyn Warr A., MD 03/15/2014, 11:53 AM

## 2014-03-15 NOTE — Patient Instructions (Signed)
Post-operative Instructions after Hysterectomy  Activity  During the first few days at home, you will need to listen to your body.  It is important to be up and move around.  It is equally important to rest when you feel tired.  You should not, however, just lie in bed for days.  This will make you stiff and does increase your risk of clot formation right after your surgery.  Try to increase activity level slowly each day.  You may climb stairs. Go slowly at first.  No heavy lifting (>pounds) for at least four weeks.  Heavy household chores like vacuuming, mopping, pushing furniture, and activities that require repetitive reaching should be avoided.  You can cook if it doesn't make you uncomfortable.  You can do laundry, just be careful when taking items in and out of the washer/dryer.  Do not lift a heavy laundry basket.  You can go grocery shopping if it doesn't make you too tired.  In general, you can bend and you can lift light objects but you must listen to your body.  If it hurts, don't do it.  You can drive in 5-40 days after surgery.  This is not a rule but a guideline. If you feel you can can comfortably sit behind the wheel, turn your head, and hit the breakes hard, then you can drive.  If you are taking narcotic pain medication during the day, then you SHOULD NOT DRIVE.  Nothing in the vagina for six weeks, including tampons, douching, or engaging in sexual activity.  You can shower the day you get home from the hospital. No tub baths for one week.  Pain Management  You will have a prescription for medication containing narcotics, either Vicodin 5/500 or Percocet 5/325. You can take 2 tablets every 4-6 hours as needed for pain.  Only take this when you are hurting, not just every four hours because the directions say you can.  You should find that each day the pain is improved and that you can take less and/or spread out the time between doses.  You will also be given a  prescription for Motrin 800 mg or advised to use over the counter Motrin 200 mg.    You should not exceed taking 2000 mg of Tylenol. This equals either four Vicodin or six Percocet.  You may find that for a day or two you require more medication than stated but this should not continue for several days.  If it does, please call the office so your medication can be changed.  Most narcotic pain medications require a hand written prescription.  If we need to change your medication, enough time is needed for a family member or friend to get to the office to pick up the prescription.  You can use a heating pad. This is especially good for pelvic cramping.  Incision Care  Your incision will either have little small paper strips on the (Steri-strips) or have skin super glue (Dermabond).  Do not pick these off.  They will get loose over the first two weeks after surgery.  When they are about to fall off, you can then remove them.  Use soap and water only to clean the incisions. An antibacterial soap like Dial is good to use.   Once the strips or glue comes off , you can use Neosporin, if you like.  After about three weeks, if you want to use something to help the scars go away faster; you may use  Mederma or Vitamin E.  Use a small amount twice daily on the scars and rub it in good.  If you have any redness or draining from the incision that makes you think there is infection, pleas call the office.  Urination and Bowel Movements  A catheter is used to drain your bladder during the surgery.   You may have some stinging the first few days after surgery when you urinate.  This is normal.  It should go away quickly.  Some will experience bladder spasms for several days after surgery. This feels like a pain at the end of emptying your bladder.  This is not worrisome.  If it is getting worse or more intense, there are some medications tat can be used short term to help.  You will need to call the office.    Most women will have a bowel movement 2-4 days after surgery.  It takes this long because you will probably d a bowel prep before surgery, because your food intake is often decreased and because narcotics pain medications can cause constipation.  To help prevent constipation, drink lots of fluids and stay hydrated.  You should use a mild stool softener like over the counter Colace (Docusate Sodium) twice daily.  Also, drinking warm liquids like coffee, tea, and hot chocolate can help.  Moving around helps too.  Stop the stool softener when you are having regular bowel movements.  If the constipation is severe, you can progress to using Miralax (an over the counter product that you mix with a small amount of water) or use an enema.  DO NOT USE ANY LAXATIVES.  If you have questions, call the office.  Vaginal Care  You do not need to do anything special.  Just shower normally .  Expect spotting or light bleeding. Lonia Blood is different for everyone and can be as little as spotting for a couple of days after surgery or spotting/light bleeding for weeks.  Do not use tampons.  You will need to wear a mini pad.  Important Reasons to Call the Office  Fever > 100.5. This is a real post-operative temperature and you will need to be seen.  Heavy vaginal bleeding like a period.  Unusual vaginal discharge or odor.  Redness or drainage from your incisions that look like infection.  Laparoscopically Assisted Vaginal Hysterectomy, Care After Refer to this sheet in the next few weeks. These instructions provide you with information on caring for yourself after your procedure. Your health care provider may also give you more specific instructions. Your treatment has been planned according to current medical practices, but problems sometimes occur. Call your health care provider if you have any problems or questions after your procedure. WHAT TO EXPECT AFTER THE PROCEDURE After your procedure, it is typical  to have the following:  Abdominal pain. You will be given pain medicine to control it.  Sore throat from the breathing tube that was inserted during surgery. HOME CARE INSTRUCTIONS 9. Only take over-the-counter or prescription medicines for pain, discomfort, or fever as directed by your health care provider. 10. Do not take aspirin. It can cause bleeding. 11. Do not drive when taking pain medicine. 12. Follow your health care provider's advice regarding diet, exercise, lifting, driving, and general activities. 13. Resume your usual diet as directed and allowed. 14. Get plenty of rest and sleep. 15. Do not douche, use tampons, or have sexual intercourse for at least 6 weeks, or until your health care provider gives you  permission. 16. Change your bandages (dressings) as directed by your health care provider. 17. Monitor your temperature and notify your health care provider of a fever. 18. Take showers instead of baths for 2-3 weeks. 19. Do not drink alcohol until your health care provider gives you permission. 20. If you develop constipation, you may take a mild laxative with your health care provider's permission. Bran foods may help with constipation problems. Drinking enough fluids to keep your urine clear or pale yellow may help as well. 21. Try to have someone home with you for 1-2 weeks to help around the house. 4. Keep all of your follow-up appointments as directed by your health care provider. SEEK MEDICAL CARE IF:   You have swelling, redness, or increasing pain around your incision sites.  You have pus coming from your incision.  You notice a bad smell coming from your incision.  Your incision breaks open.  You feel dizzy or lightheaded.  You have pain or bleeding when you urinate.  You have persistent diarrhea.  You have persistent nausea and vomiting.  You have abnormal vaginal discharge.  You have a rash.  You have any type of abnormal reaction or develop an  allergy to your medicine.  You have poor pain control with your prescribed medicine. SEEK IMMEDIATE MEDICAL CARE IF:   You have a fever.  You have severe abdominal pain.  You have chest pain.  You have shortness of breath.  You faint.  You have pain, swelling, or redness in your leg.  You have heavy vaginal bleeding with blood clots. MAKE SURE YOU: 5. Understand these instructions. 6. Will watch your condition. 7. Will get help right away if you are not doing well or get worse. Document Released: 03/06/2011 Document Revised: 03/22/2013 Document Reviewed: 09/30/2012 [facility] Patient Information 2015 [place], [place]. This information is not intended to replace advice given to you by your health care provider. Make sure you discuss any questions you have with your health care provider.

## 2014-03-16 ENCOUNTER — Encounter (HOSPITAL_COMMUNITY): Payer: Self-pay | Admitting: *Deleted

## 2014-03-17 ENCOUNTER — Ambulatory Visit: Payer: Medicare Other | Admitting: Podiatrist

## 2014-03-21 ENCOUNTER — Inpatient Hospital Stay (HOSPITAL_COMMUNITY): Payer: Medicare Other | Admitting: Certified Registered Nurse Anesthetist

## 2014-03-21 ENCOUNTER — Encounter (HOSPITAL_COMMUNITY): Payer: Self-pay

## 2014-03-21 ENCOUNTER — Encounter (HOSPITAL_COMMUNITY)
Admission: RE | Disposition: A | Discharge status: Home / Self Care | Payer: Self-pay | Source: Ambulatory Visit | Attending: Obstetrics & Gynecology

## 2014-03-21 ENCOUNTER — Ambulatory Visit (HOSPITAL_COMMUNITY)
Admission: RE | Admit: 2014-03-21 | Discharge: 2014-03-22 | Disposition: A | Discharge status: Home / Self Care | Payer: Medicare Other | Source: Ambulatory Visit | Attending: Obstetrics & Gynecology | Admitting: Obstetrics & Gynecology

## 2014-03-21 DIAGNOSIS — C50912 Malignant neoplasm of unspecified site of left female breast: Secondary | ICD-10-CM

## 2014-03-21 DIAGNOSIS — C541 Malignant neoplasm of endometrium: Secondary | ICD-10-CM | POA: Diagnosis not present

## 2014-03-21 DIAGNOSIS — Z853 Personal history of malignant neoplasm of breast: Secondary | ICD-10-CM | POA: Insufficient documentation

## 2014-03-21 DIAGNOSIS — D649 Anemia, unspecified: Secondary | ICD-10-CM | POA: Diagnosis not present

## 2014-03-21 DIAGNOSIS — C779 Secondary and unspecified malignant neoplasm of lymph node, unspecified: Secondary | ICD-10-CM | POA: Diagnosis not present

## 2014-03-21 DIAGNOSIS — Z780 Asymptomatic menopausal state: Secondary | ICD-10-CM | POA: Insufficient documentation

## 2014-03-21 DIAGNOSIS — D259 Leiomyoma of uterus, unspecified: Secondary | ICD-10-CM | POA: Diagnosis not present

## 2014-03-21 DIAGNOSIS — C55 Malignant neoplasm of uterus, part unspecified: Secondary | ICD-10-CM

## 2014-03-21 DIAGNOSIS — N8500 Endometrial hyperplasia, unspecified: Secondary | ICD-10-CM | POA: Diagnosis not present

## 2014-03-21 DIAGNOSIS — M542 Cervicalgia: Secondary | ICD-10-CM | POA: Diagnosis not present

## 2014-03-21 DIAGNOSIS — N8502 Endometrial intraepithelial neoplasia [EIN]: Secondary | ICD-10-CM

## 2014-03-21 HISTORY — PX: ROBOTIC ASSISTED TOTAL HYSTERECTOMY WITH BILATERAL SALPINGO OOPHERECTOMY: SHX6086

## 2014-03-21 HISTORY — DX: Malignant neoplasm of uterus, part unspecified: C55

## 2014-03-21 LAB — TYPE AND SCREEN
ABO/RH(D): A NEG
ANTIBODY SCREEN: NEGATIVE

## 2014-03-21 SURGERY — ROBOTIC ASSISTED TOTAL HYSTERECTOMY WITH BILATERAL SALPINGO OOPHORECTOMY
Anesthesia: General | Laterality: Bilateral

## 2014-03-21 MED ORDER — PROPOFOL 10 MG/ML IV BOLUS
INTRAVENOUS | Status: AC
  Filled 2014-03-21: qty 20

## 2014-03-21 MED ORDER — MOMETASONE FURO-FORMOTEROL FUM 100-5 MCG/ACT IN AERO
2.0000 | INHALATION_SPRAY | Freq: Two times a day (BID) | RESPIRATORY_TRACT | Status: DC
  Filled 2014-03-21: qty 8.8

## 2014-03-21 MED ORDER — PHENYLEPHRINE HCL 10 MG/ML IJ SOLN
INTRAMUSCULAR | Status: AC
  Filled 2014-03-21: qty 1

## 2014-03-21 MED ORDER — HYDROMORPHONE HCL 1 MG/ML IJ SOLN
0.2500 mg | INTRAMUSCULAR | Status: DC | PRN
  Administered 2014-03-21 (×4): 0.5 mg via INTRAVENOUS

## 2014-03-21 MED ORDER — EPHEDRINE SULFATE 50 MG/ML IJ SOLN
INTRAMUSCULAR | Status: AC
  Filled 2014-03-21: qty 1

## 2014-03-21 MED ORDER — PROMETHAZINE HCL 25 MG/ML IJ SOLN: 6.2500 mg | INTRAMUSCULAR | Status: DC | PRN

## 2014-03-21 MED ORDER — FENTANYL CITRATE 0.05 MG/ML IJ SOLN
INTRAMUSCULAR | Status: DC | PRN
  Administered 2014-03-21 (×3): 25 ug via INTRAVENOUS
  Administered 2014-03-21: 50 ug via INTRAVENOUS
  Administered 2014-03-21 (×5): 25 ug via INTRAVENOUS

## 2014-03-21 MED ORDER — KCL IN DEXTROSE-NACL 20-5-0.45 MEQ/L-%-% IV SOLN
INTRAVENOUS | Status: DC
  Administered 2014-03-21 – 2014-03-22 (×2): via INTRAVENOUS
  Filled 2014-03-21 (×2): qty 1000

## 2014-03-21 MED ORDER — DEXAMETHASONE SODIUM PHOSPHATE 10 MG/ML IJ SOLN
INTRAMUSCULAR | Status: AC
  Filled 2014-03-21: qty 1

## 2014-03-21 MED ORDER — OXYCODONE-ACETAMINOPHEN 5-325 MG PO TABS
1.0000 | ORAL_TABLET | ORAL | Status: DC | PRN
  Administered 2014-03-22: 1 via ORAL
  Filled 2014-03-21: qty 1

## 2014-03-21 MED ORDER — ONDANSETRON HCL 4 MG PO TABS: 4.0000 mg | ORAL_TABLET | Freq: Four times a day (QID) | ORAL | Status: DC | PRN

## 2014-03-21 MED ORDER — PHENYLEPHRINE HCL 10 MG/ML IJ SOLN
10.0000 mg | INTRAVENOUS | Status: DC | PRN
  Administered 2014-03-21: 25 ug/min via INTRAVENOUS

## 2014-03-21 MED ORDER — NEOSTIGMINE METHYLSULFATE 10 MG/10ML IV SOLN
INTRAVENOUS | Status: AC
  Filled 2014-03-21: qty 1

## 2014-03-21 MED ORDER — ONDANSETRON HCL 4 MG/2ML IJ SOLN
INTRAMUSCULAR | Status: DC | PRN
  Administered 2014-03-21: 4 mg via INTRAVENOUS

## 2014-03-21 MED ORDER — GLYCOPYRROLATE 0.2 MG/ML IJ SOLN
INTRAMUSCULAR | Status: AC
  Filled 2014-03-21: qty 3

## 2014-03-21 MED ORDER — TRAMADOL HCL 50 MG PO TABS: 50.0000 mg | ORAL_TABLET | Freq: Four times a day (QID) | ORAL | Status: DC | PRN

## 2014-03-21 MED ORDER — GLYCOPYRROLATE 0.2 MG/ML IJ SOLN
INTRAMUSCULAR | Status: DC | PRN
  Administered 2014-03-21: 0.6 mg via INTRAVENOUS

## 2014-03-21 MED ORDER — LIDOCAINE HCL (CARDIAC) 20 MG/ML IV SOLN
INTRAVENOUS | Status: AC
  Filled 2014-03-21: qty 5

## 2014-03-21 MED ORDER — NEOSTIGMINE METHYLSULFATE 10 MG/10ML IV SOLN
INTRAVENOUS | Status: DC | PRN
  Administered 2014-03-21: 5 mg via INTRAVENOUS

## 2014-03-21 MED ORDER — ONDANSETRON HCL 4 MG/2ML IJ SOLN
INTRAMUSCULAR | Status: AC
  Filled 2014-03-21: qty 2

## 2014-03-21 MED ORDER — LIDOCAINE HCL (CARDIAC) 20 MG/ML IV SOLN
INTRAVENOUS | Status: DC | PRN
  Administered 2014-03-21: 100 mg via INTRAVENOUS

## 2014-03-21 MED ORDER — DEXAMETHASONE SODIUM PHOSPHATE 10 MG/ML IJ SOLN
INTRAMUSCULAR | Status: DC | PRN
  Administered 2014-03-21: 10 mg via INTRAVENOUS

## 2014-03-21 MED ORDER — RINGERS IRRIGATION IR SOLN
Status: DC | PRN
  Administered 2014-03-21: 1

## 2014-03-21 MED ORDER — ONDANSETRON HCL 4 MG/2ML IJ SOLN: 4.0000 mg | Freq: Four times a day (QID) | INTRAMUSCULAR | Status: DC | PRN

## 2014-03-21 MED ORDER — MEPERIDINE HCL 50 MG/ML IJ SOLN: 6.2500 mg | INTRAMUSCULAR | Status: DC | PRN

## 2014-03-21 MED ORDER — LACTATED RINGERS IV SOLN: INTRAVENOUS | Status: DC

## 2014-03-21 MED ORDER — LACTATED RINGERS IV SOLN
INTRAVENOUS | Status: DC | PRN
  Administered 2014-03-21 (×2): via INTRAVENOUS

## 2014-03-21 MED ORDER — FENTANYL CITRATE 0.05 MG/ML IJ SOLN
INTRAMUSCULAR | Status: AC
  Filled 2014-03-21: qty 5

## 2014-03-21 MED ORDER — METOCLOPRAMIDE HCL 5 MG/ML IJ SOLN
INTRAMUSCULAR | Status: DC | PRN
Start: 2014-03-21 — End: 2014-03-21
  Administered 2014-03-21: 10 mg via INTRAVENOUS

## 2014-03-21 MED ORDER — HYDROMORPHONE HCL 1 MG/ML IJ SOLN
INTRAMUSCULAR | Status: AC
  Filled 2014-03-21: qty 1

## 2014-03-21 MED ORDER — SUCCINYLCHOLINE CHLORIDE 20 MG/ML IJ SOLN
INTRAMUSCULAR | Status: DC | PRN
  Administered 2014-03-21: 100 mg via INTRAVENOUS

## 2014-03-21 MED ORDER — PROPOFOL 10 MG/ML IV BOLUS
INTRAVENOUS | Status: DC | PRN
  Administered 2014-03-21: 130 mg via INTRAVENOUS

## 2014-03-21 MED ORDER — ROCURONIUM BROMIDE 100 MG/10ML IV SOLN
INTRAVENOUS | Status: DC | PRN
  Administered 2014-03-21: 40 mg via INTRAVENOUS

## 2014-03-21 MED ORDER — CEFAZOLIN SODIUM-DEXTROSE 2-3 GM-% IV SOLR
2.0000 g | INTRAVENOUS | Status: AC
  Administered 2014-03-21: 2 g via INTRAVENOUS

## 2014-03-21 MED ORDER — HYDROMORPHONE HCL 1 MG/ML IJ SOLN
0.5000 mg | INTRAMUSCULAR | Status: DC | PRN
  Administered 2014-03-21 – 2014-03-22 (×4): 0.5 mg via INTRAVENOUS
  Filled 2014-03-21 (×4): qty 1

## 2014-03-21 MED ORDER — METOCLOPRAMIDE HCL 5 MG/ML IJ SOLN
INTRAMUSCULAR | Status: AC
  Filled 2014-03-21: qty 2

## 2014-03-21 MED ORDER — ENOXAPARIN SODIUM 40 MG/0.4ML ~~LOC~~ SOLN
40.0000 mg | SUBCUTANEOUS | Status: DC
  Administered 2014-03-22: 40 mg via SUBCUTANEOUS
  Filled 2014-03-21 (×2): qty 0.4

## 2014-03-21 MED ORDER — SODIUM CHLORIDE 0.9 % IJ SOLN
INTRAMUSCULAR | Status: AC
  Filled 2014-03-21: qty 10

## 2014-03-21 MED ORDER — ROCURONIUM BROMIDE 100 MG/10ML IV SOLN
INTRAVENOUS | Status: AC
  Filled 2014-03-21: qty 1

## 2014-03-21 MED ORDER — EPHEDRINE SULFATE 50 MG/ML IJ SOLN
INTRAMUSCULAR | Status: DC | PRN
  Administered 2014-03-21: 5 mg via INTRAVENOUS
  Administered 2014-03-21: 10 mg via INTRAVENOUS

## 2014-03-21 MED ORDER — CEFAZOLIN SODIUM-DEXTROSE 2-3 GM-% IV SOLR
INTRAVENOUS | Status: AC
  Filled 2014-03-21: qty 50

## 2014-03-21 SURGICAL SUPPLY — 57 items
BENZOIN TINCTURE PRP APPL 2/3 (GAUZE/BANDAGES/DRESSINGS) IMPLANT
CABLE HIGH FREQUENCY MONO STRZ (ELECTRODE) ×2 IMPLANT
CHLORAPREP W/TINT 26ML (MISCELLANEOUS) ×2 IMPLANT
CORDS BIPOLAR (ELECTRODE) ×2 IMPLANT
COVER SURGICAL LIGHT HANDLE (MISCELLANEOUS) ×2 IMPLANT
COVER TIP SHEARS 8 DVNC (MISCELLANEOUS) ×1 IMPLANT
COVER TIP SHEARS 8MM DA VINCI (MISCELLANEOUS) ×1
DERMABOND ADVANCED (GAUZE/BANDAGES/DRESSINGS) ×1
DERMABOND ADVANCED .7 DNX12 (GAUZE/BANDAGES/DRESSINGS) ×1 IMPLANT
DRAPE SHEET LG 3/4 BI-LAMINATE (DRAPES) ×4 IMPLANT
DRAPE SURG IRRIG POUCH 19X23 (DRAPES) ×2 IMPLANT
DRAPE TABLE BACK 44X90 PK DISP (DRAPES) ×4 IMPLANT
DRAPE UTILITY XL STRL (DRAPES) ×2 IMPLANT
DRAPE WARM FLUID 44X44 (DRAPE) ×2 IMPLANT
DRSG TEGADERM 2-3/8X2-3/4 SM (GAUZE/BANDAGES/DRESSINGS) IMPLANT
DRSG TEGADERM 4X4.75 (GAUZE/BANDAGES/DRESSINGS) IMPLANT
DRSG TEGADERM 6X8 (GAUZE/BANDAGES/DRESSINGS) ×4 IMPLANT
ELECT REM PT RETURN 9FT ADLT (ELECTROSURGICAL) ×2
ELECTRODE REM PT RTRN 9FT ADLT (ELECTROSURGICAL) ×1 IMPLANT
GAUZE SPONGE 2X2 8PLY STRL LF (GAUZE/BANDAGES/DRESSINGS) IMPLANT
GLOVE BIO SURGEON STRL SZ 6.5 (GLOVE) ×8 IMPLANT
GLOVE BIO SURGEON STRL SZ7.5 (GLOVE) ×4 IMPLANT
GLOVE INDICATOR 8.0 STRL GRN (GLOVE) ×4 IMPLANT
GOWN STRL NON-REIN LRG LVL3 (GOWN DISPOSABLE) ×2 IMPLANT
GOWN STRL REUS W/TWL XL LVL3 (GOWN DISPOSABLE) ×4 IMPLANT
HOLDER FOLEY CATH W/STRAP (MISCELLANEOUS) ×2 IMPLANT
KIT ACCESSORY DA VINCI DISP (KITS) ×1
KIT ACCESSORY DVNC DISP (KITS) ×1 IMPLANT
KIT BASIN OR (CUSTOM PROCEDURE TRAY) ×2 IMPLANT
MANIPULATOR UTERINE 4.5 ZUMI (MISCELLANEOUS) ×2 IMPLANT
OCCLUDER COLPOPNEUMO (BALLOONS) ×2 IMPLANT
PAD POSITIONER PINK NONSTERILE (MISCELLANEOUS) ×2 IMPLANT
POUCH SPECIMEN RETRIEVAL 10MM (ENDOMECHANICALS) ×4 IMPLANT
SET TUBE IRRIG SUCTION NO TIP (IRRIGATION / IRRIGATOR) ×2 IMPLANT
SHEET LAVH (DRAPES) ×2 IMPLANT
SOLUTION ELECTROLUBE (MISCELLANEOUS) ×2 IMPLANT
SPONGE GAUZE 2X2 STER 10/PKG (GAUZE/BANDAGES/DRESSINGS)
SPONGE LAP 18X18 X RAY DECT (DISPOSABLE) IMPLANT
STRIP CLOSURE SKIN 1/2X4 (GAUZE/BANDAGES/DRESSINGS) IMPLANT
SUT VIC AB 0 CT1 27 (SUTURE) ×1
SUT VIC AB 0 CT1 27XBRD ANTBC (SUTURE) ×1 IMPLANT
SUT VIC AB 4-0 PS2 27 (SUTURE) ×4 IMPLANT
SUT VICRYL 0 UR6 27IN ABS (SUTURE) ×2 IMPLANT
SUT VLOC 180 0 9IN  GS21 (SUTURE) ×1
SUT VLOC 180 0 9IN GS21 (SUTURE) ×1 IMPLANT
SYR 50ML LL SCALE MARK (SYRINGE) ×2 IMPLANT
SYR BULB IRRIGATION 50ML (SYRINGE) IMPLANT
TOWEL OR 17X26 10 PK STRL BLUE (TOWEL DISPOSABLE) ×4 IMPLANT
TOWEL OR NON WOVEN STRL DISP B (DISPOSABLE) ×2 IMPLANT
TRAP SPECIMEN MUCOUS 40CC (MISCELLANEOUS) IMPLANT
TRAY FOLEY CATH 14FRSI W/METER (CATHETERS) ×2 IMPLANT
TRAY LAPAROSCOPIC (CUSTOM PROCEDURE TRAY) ×2 IMPLANT
TROCAR 12M 150ML BLUNT (TROCAR) ×2 IMPLANT
TROCAR BLADELESS OPT 5 75 (ENDOMECHANICALS) ×2 IMPLANT
TROCAR XCEL 12X100 BLDLESS (ENDOMECHANICALS) ×2 IMPLANT
TUBING INSUFFLATION 10FT LAP (TUBING) ×2 IMPLANT
WATER STERILE IRR 1500ML POUR (IV SOLUTION) ×4 IMPLANT

## 2014-03-21 NOTE — Progress Notes (Signed)
PACU note-----Dr. Skeet Latch in to talk with pt; also assessed pink urine status; urine clearing

## 2014-03-21 NOTE — Interval H&P Note (Signed)
History and Physical Interval Note:  03/21/2014 7:25 AM  Beth Simon  has presented today for surgery, with the diagnosis of endomterial hyperplasia  The various methods of treatment have been discussed with the patient and family. After consideration of risks, benefits and other options for treatment, the patient has consented to  Procedure(s): ROBOTIC ASSISTED TOTAL HYSTERECTOMY WITH BILATERAL SALPINGO OOPHORECTOMY POSSIBLE LYMPHNODE DISECTION (Bilateral) as a surgical intervention .  The patient's history has been reviewed, patient examined, no change in status, stable for surgery.  I have reviewed the patient's chart and labs.  Questions were answered to the patient's satisfaction.     [place], [facility]

## 2014-03-21 NOTE — Anesthesia Postprocedure Evaluation (Signed)
  Anesthesia Post-op Note  Patient: Beth Simon  Procedure(s) Performed: Procedure(s) (LRB): ROBOTIC ASSISTED TOTAL HYSTERECTOMY WITH BILATERAL SALPINGO OOPHORECTOMY  (Bilateral)  Patient Location: PACU  Anesthesia Type: General  Level of Consciousness: awake and alert   Airway and Oxygen Therapy: Patient Spontanous Breathing  Post-op Pain: mild  Post-op Assessment: Post-op Vital signs reviewed, Patient's Cardiovascular Status Stable, Respiratory Function Stable, Patent Airway and No signs of Nausea or vomiting  Last Vitals:  Filed Vitals:   03/21/14 1130  BP: 149/62  Pulse: 71  Temp: 36.4 C  Resp: 15    Post-op Vital Signs: stable   Complications: No apparent anesthesia complications

## 2014-03-21 NOTE — Op Note (Signed)
Preoperative Diagnosis: Complex atypical endometrial hyperplasia  Postoperative Diagnosis: Grade 1 Endometrial cancer  Procedure(s) Performed: Robotic total laparoscopic hysterectomy, Bilateral salpingo oophorectomy,   Anesthesia: GET  Surgeon: Francetta Found.  Skeet Latch, M.D. PhD  Assistant Surgeon:Lisa Delsa Sale MD.   Specimens: Uterus cervix, bilateral ovaries and fallopian tubes  Estimated Blood Loss: Min   Complications:None  Indication for Procedure: This is a 73 y.o. who underwent endometrial sampling that demonstrated complex atypical hyperplasia  Operative Findings:  6 cm uterus. Normal adnexa and ureter. No masses.   Frozen pathology was consistent with grade 1 minimally invasive.  Procedure: Patient was taken to the operating room and placed under general endotracheal anesthesia without any difficulty. She is placed in the dorsal lithotomy position and secured to the operative table over the chest with tape.   The patient was prepped and draped and the uterine manipulator placed within the endometrial cavity. The appropriately sized Koh ring was circumferentially around the cervix. The balloon was placed within the vagina. An OG tube was present and functional. At an area on the left in line with the nipple approximately 2 cm  below the ribs and an incision was made and a 5 mm Optiview inserted under direct visualization. The abdomen was insufflated to 15 mm of mercury and the pressure never deviated above that throughout the remainder of the procedure. Maximum Trendelenburg positioning was obtained. At approximately 20 cm proximal to the symphysis pubis an incision was made just superior to the umbilicus. This area was infiltrated with lidocaine as well as the location 10 cm lateral to this incision and 2 cm superior to the left anterior superior iliac spine. Incisions were made. 10 mm trocar was inserted in the superior umbilicus incision. 8 millimeter robotic ports were placed in  the other 3 incisions. The left upper quadrant port site was replaced with a 10 mm port. This was all completed under direct visualization. The small and large bowel were reflected as much as possible into the upper abdomen. The robot was docked and instruments placed.  The right round ligament was transected and the ureter was identified. The right infundibulopelvic ligament was cauterized and transected The retroperitoneal space was entered on the right and the peritoneum incised to the level of the vesicouterine ligament anteriorly. The bladder flap was created using Bovie cautery. The peritoneal dissection was continued inferiorly and across the inferior most aspect of the cervix. In this manner the urethra was deflected inferiorly. The bladder flap was further developed. The uterine vessels on the right were skeletonized ligated and transected.  The left ureter was identified. The left gonadal vessels were cauterized and transected. The broad ligament was skeletonized posteriorly to the level of the cervix and the peritoneum dissected free from the cervix and in this fashion the ureter was deflected inferiorly. The anterior peritoneum was further dissected and the bladder flap appropriately developed. The uterine vessels were skeletonized cauterized and transected. The balloon and the vagina was then maximally insufflated. A colpotomy incision was made circumferentially and the uterus cervix ovaries and tubes were delivered from the vagina. The Koh ring was removed and the balloon was replaced.   At that time the frozen section evaluation returned and it a small focus of grade 1 endometrioid cancer superficially invasive.    The pelvis was copiously irrigated and drained and hemostasis was assured. The vaginal cuff was closed with a running V lock suture.   The needle was removed under direct visualization. The operative site is once again  visualized and hemostasis was assured. The instruments were  removed from the abdomen and pelvis and the port sites irrigated. The umbilical fascia was closed with an interrupted 0 Vicryl  suture. The subcutaneous tissue of the umbilical left upper quadrant and right lower quadrant subcutaneous tissues were approximated with a single suture. Skin incisions were closed with a subcuticular suture.  The vaginal vault was cleared with a moist sponge stick.  Sponge, lap and needle counts were correct x 3.    The patient had sequential compression devices and preoperative Lovenox for VTE prophylaxis          Disposition: PACU - hemodynamically stable.         Condition:stable Foley draining blood tinged urine.

## 2014-03-21 NOTE — H&P (View-Only) (Signed)
Consult Note: Gyn-Onc  Consult was requested by Dr. Charlies Constable for the evaluation of Beth Simon 73 y.o. female  CC: Endometrial complex atypical hyperplasia  Assessment/Plan:  [name]  is a 73 y.o.  With CAH.  Recommendation was made for hysterectomy BSO  With staging as indicated based on frozen section findings.    I extensively reviewed the risks of robotic hysterectomy bilateral salpingooophorectomy and possible lymph node dissection of infection, bleeding, damage to nearby organs including bowel, bladder, vessels, nerves, and ureters. We discussed postoperative risks including infection, PE/ DVT, and lymphedema. We reviewed possible need for conversion to open laparotomy, need for possible blood transfusion. I discussed positioning during surgery of trendelenberg and risks of minor facial swelling and care we take in preoperative positioning. We also reviewed possible need for further treatment such as radiation or chemotherapy based on final pathology. Expected postoperative recovery was also discussed.  Her procedure was originally scheduled for the last week that she felt like she had bronchitis and we scheduled for this coming Tuesday with Dr. Skeet Latch she had initially met in the new patient.  All of her questions were answered to her satisfaction.  Patient is aware that given her personal and family history genetic counseling will be arranged post -operatively.  Will likely request IHC staining if malignancy is identified.   HPI: [name]  is a 73 y.o.   G1 P1 menopausal.  Noted irregular pelvic cramping after the death of her husband.  Over the past two months she noticed vaginal discharge.  Vaginal ultrasound 01/10/2014 endometrial stripe 14.70mm.  Endometrial mass with cystic changes noted 2x1 cm.  Atrophic ovaries.  Uterine size 6.46x4.21 cm.  EMB 01/10/2014 Complex atypical hyperplasia.  History notable for left breast cancer ER-PR-  treated with radical  mastectomy and chemotherapy in 1990.  Subsequently underwent right simple mastectomy 2008 without reconstruction. Denies use of tamoxifen  Reports weight gain since the loss of her husband.  No abdominal bloating, no changes in bowel or rectal habits.    Interval history: She was scheduled for surgery on December 8 however she called and canceled secondary to feeling like she had a cold, bronchitis and feeling run down. She saw her primary care physician last Thursday was prescribed Advair which is improved her symptoms. She continues to complain of some mucus congestion. She does have a cough with white mucus. She's been afebrile. She does feel that her ears are little bit stopped up. She'll review all feels "knocked out". She did have her flu shot as well as her Pneumovax vaccination 6 year. It has been a difficult year for her. Her husband passed away in 2022-08-26. She traveled to [place] to see a sister who had Alzheimer's and now this diagnosis. She denies any shortness of breath or chest pain.  Review of Systems: As above   Current Meds:  Outpatient Encounter Prescriptions as of 03/15/2014  Medication Sig  . ADVAIR DISKUS 100-50 MCG/DOSE AEPB Inhale 1 puff into the lungs 2 (two) times daily.   . Coenzyme Q10 (COQ10 PO) Take 1 tablet by mouth every morning.   . Cyanocobalamin (VITAMIN B 12 PO) Take 1 tablet by mouth every morning.   . denosumab (PROLIA) 60 MG/ML SOLN injection Inject 60 mg into the skin every 6 (six) months. Administer in upper arm, thigh, or abdomen  . Flaxseed, Linseed, (FLAX SEEDS PO) Take 1 scoop by mouth every morning. Ground flaxseed in cereal.  . Glucosamine HCl (GLUCOSAMINE  PO) Take 1 tablet by mouth every morning.   Marland Kitchen guaiFENesin (MUCINEX) 600 MG 12 hr tablet Take 600 mg by mouth 2 (two) times daily as needed.  Marland Kitchen MELATONIN PO Take 1 tablet by mouth at bedtime as needed (sleep.).   Marland Kitchen Omega-3 Fatty Acids (FISH OIL PO) Take 1 capsule by mouth every morning.   Marland Kitchen OVER THE  COUNTER MEDICATION Take 1 tablet by mouth every morning. Astazanthin. (anti-oxidant)  . raloxifene (EVISTA) 60 MG tablet Take 1 tablet (60 mg total) by mouth daily. (Patient taking differently: Take 60 mg by mouth every morning. )  . VITAMIN D, CHOLECALCIFEROL, PO Take 4,000 Int'l Units by mouth every morning.   . [DISCONTINUED] benzonatate (TESSALON) 100 MG capsule     Allergy:  Allergies  Allergen Reactions  . Novocain [Procaine] Other (See Comments)    Heart palpitations    Social Hx:   History   Social History  . Marital Status: Widowed    Spouse Name: N/A    Number of Children: 1  . Years of Education: N/A   Occupational History  . Not on file.   Social History Main Topics  . Smoking status: Never Smoker   . Smokeless tobacco: Never Used  . Alcohol Use: No  . Drug Use: No  . Sexual Activity: No   Other Topics Concern  . Not on file   Social History Narrative  Husband died September 05, 2013.  Plans to spend Thanksgiving out of town with her son  Past Surgical History  Procedure Laterality Date  . Excision vaginal cyst    . Breast implant removal  6/94    left breast  . Cataract extraction Bilateral   . Radical mastectomy lnd  1990    left, chemo done  . Breast reconstruction  1990    left  . Mastectomy  10/08/06    right  . Mastectomy Right 2008    prophalactic     Past Medical Hx:  Past Medical History  Diagnosis Date  . Osteoporosis     on Evista  . Cystitis     cytoxen, had once or twice  . Vasovagal syncopes   . Macular degeneration   . Anemia   . Uterine fibroid   . Complication of anesthesia   . PONV (postoperative nausea and vomiting)   . Breast cancer 1990    bilateral mastectomy, adenoca breast-left MRM, reconstruction, chemo  . Bronchitis last 2 weeks    saw dr Felipa Eth 02-28-2014, he said no antibiotics needed, nonproductive cough  . Shoulder pain     taking physical therapy for last few weeks  . Neck pain     taking physictal therapy last  2 weeks    Past Gynecological History: G1P1 Menarche 69 . Long history of irregular and heavy mesnses.  Never used birth control.  o h/o abnormal pap test Patient's last menstrual period was 03/31/1988.  Family Hx:  Family History  Problem Relation Age of Onset  . Breast cancer Sister 63    recurrence age 54  . Breast cancer Paternal Grandmother   . Heart disease Mother   . Heart disease Father   . Hypertension Sister   Maternal aunt breast cancer dx 68y/o Paternal grandmother breast cancer age at dx ?  Vitals:  Blood pressure 150/85, pulse 82, temperature 98.1 F (36.7 C), temperature source Oral, resp. rate 18, height 5' (1.524 m), weight 132 lb 12.8 oz (60.238 kg), last menstrual period 03/31/1988. Body mass index is 25.94  kg/(m^2).   Physical Exam: WD in NAD  Cardiovascular:  Pulse normal rate, regularity and rhythm. S1 and S2 normal. Audible click in the mitral position  Lungs: Clear to auscultation bilaterally, Good air movement. No wheezing, no rhonchi    Tkai Serfass A., MD 03/15/2014, 11:53 AM

## 2014-03-21 NOTE — Transfer of Care (Signed)
Immediate Anesthesia Transfer of Care Note  Patient: Beth Simon  Procedure(s) Performed: Procedure(s) (LRB): ROBOTIC ASSISTED TOTAL HYSTERECTOMY WITH BILATERAL SALPINGO OOPHORECTOMY  (Bilateral)  Patient Location: PACU  Anesthesia Type: General  Level of Consciousness: sedated, patient cooperative and responds to stimulation  Airway & Oxygen Therapy: Patient Spontanous Breathing and Patient connected to face mask oxgen  Post-op Assessment: Report given to PACU RN and Post -op Vital signs reviewed and stable  Post vital signs: Reviewed and stable  Complications: No apparent anesthesia complications

## 2014-03-21 NOTE — Anesthesia Preprocedure Evaluation (Signed)
Anesthesia Evaluation  Patient identified by MRN, date of birth, ID band Patient awake    Reviewed: Allergy & Precautions, H&P , NPO status , Patient's Chart, lab work & pertinent test results  History of Anesthesia Complications (+) PONV  Airway Mallampati: II  TM Distance: >3 FB Neck ROM: Full    Dental no notable dental hx.    Pulmonary neg pulmonary ROS,  breath sounds clear to auscultation  Pulmonary exam normal       Cardiovascular negative cardio ROS  Rhythm:Regular Rate:Normal     Neuro/Psych negative neurological ROS  negative psych ROS   GI/Hepatic negative GI ROS, Neg liver ROS,   Endo/Other  negative endocrine ROS  Renal/GU negative Renal ROS     Musculoskeletal negative musculoskeletal ROS (+)   Abdominal   Peds  Hematology  (+) anemia ,   Anesthesia Other Findings   Reproductive/Obstetrics negative OB ROS                             Anesthesia Physical Anesthesia Plan  ASA: II  Anesthesia Plan: General   Post-op Pain Management:    Induction: Intravenous  Airway Management Planned: Oral ETT  Additional Equipment: None  Intra-op Plan:   Post-operative Plan: Extubation in OR  Informed Consent: I have reviewed the patients History and Physical, chart, labs and discussed the procedure including the risks, benefits and alternatives for the proposed anesthesia with the patient or authorized representative who has indicated his/her understanding and acceptance.   Dental advisory given  Plan Discussed with: CRNA  Anesthesia Plan Comments:         Anesthesia Quick Evaluation

## 2014-03-21 NOTE — Progress Notes (Signed)
Chart reviewed.

## 2014-03-22 ENCOUNTER — Encounter (HOSPITAL_COMMUNITY): Payer: Self-pay | Admitting: Gynecologic Oncology

## 2014-03-22 DIAGNOSIS — C541 Malignant neoplasm of endometrium: Secondary | ICD-10-CM | POA: Diagnosis not present

## 2014-03-22 LAB — BASIC METABOLIC PANEL
ANION GAP: 5 (ref 5–15)
BUN: 9 mg/dL (ref 6–23)
CALCIUM: 8 mg/dL — AB (ref 8.4–10.5)
CO2: 22 mmol/L (ref 19–32)
CREATININE: 0.63 mg/dL (ref 0.50–1.10)
Chloride: 109 mEq/L (ref 96–112)
GFR, EST NON AFRICAN AMERICAN: 86 mL/min — AB (ref >90)
Glucose, Bld: 112 mg/dL — ABNORMAL HIGH (ref 70–99)
Potassium: 4.1 mmol/L (ref 3.5–5.1)
SODIUM: 136 mmol/L (ref 135–145)

## 2014-03-22 LAB — CBC
HEMATOCRIT: 37.3 % (ref 36.0–46.0)
Hemoglobin: 12.1 g/dL (ref 12.0–15.0)
MCH: 30.8 pg (ref 26.0–34.0)
MCHC: 32.4 g/dL (ref 30.0–36.0)
MCV: 94.9 fL (ref 78.0–100.0)
PLATELETS: 186 10*3/uL (ref 150–400)
RBC: 3.93 MIL/uL (ref 3.87–5.11)
RDW: 13.2 % (ref 11.5–15.5)
WBC: 17 10*3/uL — AB (ref 4.0–10.5)

## 2014-03-22 MED ORDER — OXYCODONE-ACETAMINOPHEN 5-325 MG PO TABS: 1.0000 | ORAL_TABLET | ORAL | Status: DC | PRN

## 2014-03-22 NOTE — Discharge Summary (Signed)
Physician Discharge Summary  Patient ID: Beth Simon MRN: [id number] DOB/AGE: [birth year] 73 y.o.  Admit date: 03/21/2014 Discharge date: 03/22/2014  Admission Diagnoses: Endometrial cancer  Discharge Diagnoses:  Principal Problem:   Endometrial cancer   Discharged Condition:  The patient is in good condition and stable for discharge.   Hospital Course: On 03/21/2014, the patient underwent the following: Procedure(s): ROBOTIC ASSISTED TOTAL HYSTERECTOMY WITH BILATERAL SALPINGO OOPHORECTOMY .   The postoperative course was uneventful.  She was discharged to home on postoperative day 1 tolerating a regular diet, voiding without difficulty, with minimal pain.  Consults: None  Significant Diagnostic Studies: None  Treatments: surgery: see above  Discharge Exam: Blood pressure 111/47, pulse 79, temperature 98.9 F (37.2 C), temperature source Oral, resp. rate 16, height 5' (1.524 m), weight 132 lb 12.7 oz (60.234 kg), last menstrual period 03/31/1988, SpO2 98 %. General appearance: alert, cooperative and no distress Resp: clear to auscultation bilaterally Cardio: regular rate and rhythm and mild systolic murmur noted GI: soft, non-tender; bowel sounds normal; no masses,  no organomegaly Extremities: extremities normal, atraumatic, no cyanosis or edema Incision/Wound: Lap sites with dermabond without erythema or drainage  Disposition: Home  Discharge Instructions    Call MD for:  difficulty breathing, headache or visual disturbances    Complete by:  As directed      Call MD for:  extreme fatigue    Complete by:  As directed      Call MD for:  hives    Complete by:  As directed      Call MD for:  persistant dizziness or light-headedness    Complete by:  As directed      Call MD for:  persistant nausea and vomiting    Complete by:  As directed      Call MD for:  redness, tenderness, or signs of infection (pain, swelling, redness, odor or green/yellow discharge around  incision site)    Complete by:  As directed      Call MD for:  severe uncontrolled pain    Complete by:  As directed      Call MD for:  temperature >100.4    Complete by:  As directed      Diet - low sodium heart healthy    Complete by:  As directed      Driving Restrictions    Complete by:  As directed   No driving for 1 week.  Do not take narcotics and drive.     Increase activity slowly    Complete by:  As directed      Lifting restrictions    Complete by:  As directed   No lifting greater than 10 lbs.     Sexual Activity Restrictions    Complete by:  As directed   No sexual activity, nothing in the vagina, for 8 weeks.            Medication List    TAKE these medications        ADVAIR DISKUS 100-50 MCG/DOSE Aepb  Generic drug:  Fluticasone-Salmeterol  Inhale 1 puff into the lungs 2 (two) times daily.     COQ10 PO  Take 1 tablet by mouth every morning.     denosumab 60 MG/ML Soln injection  Commonly known as:  PROLIA  Inject 60 mg into the skin every 6 (six) months. Administer in upper arm, thigh, or abdomen     FISH OIL PO  Take 1 capsule by mouth every  morning.     FLAX SEEDS PO  Take 1 scoop by mouth every morning. Ground flaxseed in cereal.     GLUCOSAMINE PO  Take 1 tablet by mouth every morning.     guaiFENesin 600 MG 12 hr tablet  Commonly known as:  MUCINEX  Take 600 mg by mouth 2 (two) times daily as needed.     MELATONIN PO  Take 1 tablet by mouth at bedtime as needed (sleep.).     OVER THE COUNTER MEDICATION  Take 1 tablet by mouth every morning. Astazanthin. (anti-oxidant)     oxyCODONE-acetaminophen 5-325 MG per tablet  Commonly known as:  PERCOCET/ROXICET  Take 1-2 tablets by mouth every 4 (four) hours as needed for moderate pain or severe pain.     raloxifene 60 MG tablet  Commonly known as:  EVISTA  Take 1 tablet (60 mg total) by mouth daily.     VITAMIN B 12 PO  Take 1 tablet by mouth every morning.     VITAMIN D  (CHOLECALCIFEROL) PO  Take 4,000 Int'l Units by mouth every morning.           Follow-up Information    Follow up with Janie Morning, MD On 04/13/2014.   Specialty:  Obstetrics and Gynecology   Why:  at 2 pm at the [facility] information:   [address] [place] [id number] [phone]       Greater than thirty minutes were spend for face to face discharge instructions and discharge orders/summary in EPIC.   Signed: Kavian Peters DEAL 03/22/2014, 1:09 PM

## 2014-03-22 NOTE — Progress Notes (Signed)
1 Day Post-Op Procedure(s) (LRB): ROBOTIC ASSISTED TOTAL HYSTERECTOMY WITH BILATERAL SALPINGO OOPHORECTOMY  (Bilateral)  Subjective: Patient reports moderate incisional pain at times.  Ambulating with assistance.  Due to void.  Eating breakfast this am.  No concerns voiced.  Denies chest pain, dyspnea, passing flatus, or having a bowel movement.     Objective: Vital signs in last 24 hours: Temp:  [97.4 F (36.3 C)-99 F (37.2 C)] 98.9 F (37.2 C) (12/23 1000) Pulse Rate:  [69-79] 79 (12/23 1000) Resp:  [12-16] 16 (12/23 1000) BP: (111-150)/(47-84) 111/47 mmHg (12/23 1000) SpO2:  [96 %-100 %] 98 % (12/23 1000) Weight:  [132 lb 12.7 oz (60.234 kg)] 132 lb 12.7 oz (60.234 kg) (12/22 1130) Last BM Date: 03/20/14  Intake/Output from previous day: 12/22 0701 - 12/23 0700 In: 2443.5 [P.O.:720; I.V.:1722.5] Out: 3225 [Urine:3175; Blood:50]  Physical Examination: General: alert, cooperative and no distress Resp: clear to auscultation bilaterally Cardio: regular rate and rhythm and mild systolic murmur noted GI: soft, non-tender; bowel sounds normal; no masses,  no organomegaly and incision: lap sites with dermabond without erythema or drainage Extremities: extremities normal, atraumatic, no cyanosis or edema  Labs: WBC/Hgb/Hct/Plts:  17.0/12.1/37.3/186 (12/23 6629) BUN/Cr/glu/ALT/AST/amyl/lip:  9/0.63/--/--/--/--/-- (12/23 4765)  Assessment: 73 y.o. s/p Procedure(s): ROBOTIC ASSISTED TOTAL HYSTERECTOMY WITH BILATERAL SALPINGO OOPHORECTOMY : stable Pain:  Pain is well-controlled on PRN medications.  Heme: Hgb 12.1 and Hct 37.3 this am.  Stable post-operatively.  CV:  BP and HR stable post-operatively.  Continue to monitor with ordered vital signs.  GI:  Tolerating po: Yes.  GU:  Adequate output reported.    FEN: Stable post-operatively.  Prophylaxis: intermittent pneumatic compression boots and lovenox  Plan: Advance diet Encourage IS use, deep breathing, and  coughing Encourage ambulation Possible discharge later today if tolerating diet, voiding, and pain controlled with PO medications   LOS: 1 day    CROSS, MELISSA DEAL 03/22/2014, 10:54 AM

## 2014-03-22 NOTE — Discharge Instructions (Signed)
03/22/2014  Return to work: 4-6 weeks if applicable  Activity: 1. Be up and out of the bed during the day.  Take a nap if needed.  You may walk up steps but be careful and use the hand rail.  Stair climbing will tire you more than you think, you may need to stop part way and rest.   2. No lifting or straining for 6 weeks.  3. No driving for 1 week(s).  Do not drive if you are taking narcotic pain medicine.  4. Shower daily.  Use soap and water on your incision and pat dry; don't rub.  No tub baths until cleared by your surgeon.   5. No sexual activity and nothing in the vagina for 8 weeks.  Diet: 1. Low sodium Heart Healthy Diet is recommended.  2. It is safe to use a laxative, such as Miralax or Colace, if you have difficulty moving your bowels.   Wound Care: 1. Keep clean and dry.  Shower daily.  Reasons to call the Doctor:  Fever - Oral temperature greater than 100.4 degrees Fahrenheit  Foul-smelling vaginal discharge  Difficulty urinating  Nausea and vomiting  Increased pain at the site of the incision that is unrelieved with pain medicine.  Difficulty breathing with or without chest pain  New calf pain especially if only on one side  Sudden, continuing increased vaginal bleeding with or without clots.   Contacts: For questions or concerns you should contact:  Dr. Skeet Latch at [facility]  Dr. Lahoma Crocker at [phone]  Dr. Everitt Amber at [phone]  Joylene John, NP at [phone]  After Hours: call [phone] and ask for the GYN Oncologist on call  Acetaminophen; Oxycodone tablets What is this medicine? ACETAMINOPHEN; OXYCODONE (a set a MEE noe fen; ox i KOE done) is a pain reliever. It is used to treat mild to moderate pain. This medicine may be used for other purposes; ask your health care provider or pharmacist if you have questions. COMMON BRAND NAME(S): Endocet, Magnacet, Narvox, Percocet, Perloxx, Primalev, Primlev, Roxicet,  Xolox What should I tell my health care provider before I take this medicine? They need to know if you have any of these conditions: -brain tumor -Crohn's disease, inflammatory bowel disease, or ulcerative colitis -drug abuse or addiction -head injury -heart or circulation problems -if you often drink alcohol -kidney disease or problems going to the bathroom -liver disease -lung disease, asthma, or breathing problems -an unusual or allergic reaction to acetaminophen, oxycodone, other opioid analgesics, other medicines, foods, dyes, or preservatives -pregnant or trying to get pregnant -breast-feeding How should I use this medicine? Take this medicine by mouth with a full glass of water. Follow the directions on the prescription label. Take your medicine at regular intervals. Do not take your medicine more often than directed. Talk to your pediatrician regarding the use of this medicine in children. Special care may be needed. Patients over 19 years old may have a stronger reaction and need a smaller dose. Overdosage: If you think you have taken too much of this medicine contact a poison control center or emergency room at once. NOTE: This medicine is only for you. Do not share this medicine with others. What if I miss a dose? If you miss a dose, take it as soon as you can. If it is almost time for your next dose, take only that dose. Do not take double or extra doses. What may interact with this medicine? -alcohol -antihistamines -barbiturates like amobarbital, butalbital,  butabarbital, methohexital, pentobarbital, phenobarbital, thiopental, and secobarbital -benztropine -drugs for bladder problems like solifenacin, trospium, oxybutynin, tolterodine, hyoscyamine, and methscopolamine -drugs for breathing problems like ipratropium and tiotropium -drugs for certain stomach or intestine problems like propantheline, homatropine methylbromide, glycopyrrolate, atropine, belladonna, and  dicyclomine -general anesthetics like etomidate, ketamine, nitrous oxide, propofol, desflurane, enflurane, halothane, isoflurane, and sevoflurane -medicines for depression, anxiety, or psychotic disturbances -medicines for sleep -muscle relaxants -naltrexone -narcotic medicines (opiates) for pain -phenothiazines like perphenazine, thioridazine, chlorpromazine, mesoridazine, fluphenazine, prochlorperazine, promazine, and trifluoperazine -scopolamine -tramadol -trihexyphenidyl This list may not describe all possible interactions. Give your health care provider a list of all the medicines, herbs, non-prescription drugs, or dietary supplements you use. Also tell them if you smoke, drink alcohol, or use illegal drugs. Some items may interact with your medicine. What should I watch for while using this medicine? Tell your doctor or health care professional if your pain does not go away, if it gets worse, or if you have new or a different type of pain. You may develop tolerance to the medicine. Tolerance means that you will need a higher dose of the medication for pain relief. Tolerance is normal and is expected if you take this medicine for a long time. Do not suddenly stop taking your medicine because you may develop a severe reaction. Your body becomes used to the medicine. This does NOT mean you are addicted. Addiction is a behavior related to getting and using a drug for a non-medical reason. If you have pain, you have a medical reason to take pain medicine. Your doctor will tell you how much medicine to take. If your doctor wants you to stop the medicine, the dose will be slowly lowered over time to avoid any side effects. You may get drowsy or dizzy. Do not drive, use machinery, or do anything that needs mental alertness until you know how this medicine affects you. Do not stand or sit up quickly, especially if you are an older patient. This reduces the risk of dizzy or fainting spells. Alcohol may  interfere with the effect of this medicine. Avoid alcoholic drinks. There are different types of narcotic medicines (opiates) for pain. If you take more than one type at the same time, you may have more side effects. Give your health care provider a list of all medicines you use. Your doctor will tell you how much medicine to take. Do not take more medicine than directed. Call emergency for help if you have problems breathing. The medicine will cause constipation. Try to have a bowel movement at least every 2 to 3 days. If you do not have a bowel movement for 3 days, call your doctor or health care professional. Do not take Tylenol (acetaminophen) or medicines that have acetaminophen with this medicine. Too much acetaminophen can be very dangerous. Many nonprescription medicines contain acetaminophen. Always read the labels carefully to avoid taking more acetaminophen. What side effects may I notice from receiving this medicine? Side effects that you should report to your doctor or health care professional as soon as possible: -allergic reactions like skin rash, itching or hives, swelling of the face, lips, or tongue -breathing difficulties, wheezing -confusion -light headedness or fainting spells -severe stomach pain -unusually weak or tired -yellowing of the skin or the whites of the eyes Side effects that usually do not require medical attention (report to your doctor or health care professional if they continue or are bothersome): -dizziness -drowsiness -nausea -vomiting This list may not describe all possible  side effects. Call your doctor for medical advice about side effects. You may report side effects to FDA at 1-800-FDA-1088. Where should I keep my medicine? Keep out of the reach of children. This medicine can be abused. Keep your medicine in a safe place to protect it from theft. Do not share this medicine with anyone. Selling or giving away this medicine is dangerous and against the  law. Store at room temperature between 20 and 25 degrees C (68 and 77 degrees F). Keep container tightly closed. Protect from light. This medicine may cause accidental overdose and death if it is taken by other adults, children, or pets. Flush any unused medicine down the toilet to reduce the chance of harm. Do not use the medicine after the expiration date. NOTE: This sheet is a summary. It may not cover all possible information. If you have questions about this medicine, talk to your doctor, pharmacist, or health care provider.  2015, Elsevier/Gold Standard. (2012-11-08 13:17:35)

## 2014-03-27 DIAGNOSIS — R05 Cough: Secondary | ICD-10-CM | POA: Diagnosis not present

## 2014-03-28 ENCOUNTER — Telehealth: Payer: Self-pay | Admitting: Gynecologic Oncology

## 2014-03-28 ENCOUNTER — Encounter: Payer: Self-pay | Admitting: Gynecologic Oncology

## 2014-03-28 DIAGNOSIS — C541 Malignant neoplasm of endometrium: Secondary | ICD-10-CM

## 2014-03-28 NOTE — Progress Notes (Signed)
[facility] pathology contacted.  [facility] testing ordered on path per Dr. Skeet Latch.

## 2014-03-28 NOTE — Telephone Encounter (Signed)
Patient informed of final pathology results and Dr. Leone Brand recommendations for pelvic XRT and brachytherapy.  Verbalizing understanding and wanting to proceed with referral to radiation oncology.  Doing well post-operatively.  No concerns voiced.  Advised that referral would be made and to call for any questions or concerns.

## 2014-04-03 DIAGNOSIS — M542 Cervicalgia: Secondary | ICD-10-CM | POA: Diagnosis not present

## 2014-04-03 DIAGNOSIS — M79641 Pain in right hand: Secondary | ICD-10-CM | POA: Diagnosis not present

## 2014-04-03 DIAGNOSIS — R293 Abnormal posture: Secondary | ICD-10-CM | POA: Diagnosis not present

## 2014-04-03 DIAGNOSIS — M25521 Pain in right elbow: Secondary | ICD-10-CM | POA: Diagnosis not present

## 2014-04-04 ENCOUNTER — Ambulatory Visit: Payer: Self-pay | Admitting: Neurology

## 2014-04-04 ENCOUNTER — Ambulatory Visit: Payer: Medicare Other | Admitting: Neurology

## 2014-04-05 ENCOUNTER — Telehealth: Payer: Self-pay | Admitting: Emergency Medicine

## 2014-04-05 DIAGNOSIS — M810 Age-related osteoporosis without current pathological fracture: Secondary | ICD-10-CM | POA: Diagnosis not present

## 2014-04-05 NOTE — Telephone Encounter (Signed)
Incoming call from [employer], [place]. They need order for Dexa for patient. Follow up from 2013. Patient with hx of osteoporosis currently taking evista.   Order signed by Dr. Quincy Simmonds and faxed. Confirmed faxed received with [facility]   Routing to provider for final review. Patient agreeable to disposition. Will close encounter

## 2014-04-06 ENCOUNTER — Other Ambulatory Visit: Payer: Medicare Other

## 2014-04-07 ENCOUNTER — Ambulatory Visit
Admission: RE | Admit: 2014-04-07 | Discharge: 2014-04-07 | Disposition: A | Discharge status: Home / Self Care | Payer: Medicare Other | Source: Ambulatory Visit | Attending: Geriatric Medicine | Admitting: Geriatric Medicine

## 2014-04-07 DIAGNOSIS — E041 Nontoxic single thyroid nodule: Secondary | ICD-10-CM

## 2014-04-07 DIAGNOSIS — E042 Nontoxic multinodular goiter: Secondary | ICD-10-CM | POA: Diagnosis not present

## 2014-04-10 ENCOUNTER — Telehealth: Payer: Self-pay | Admitting: Nurse Practitioner

## 2014-04-10 NOTE — Telephone Encounter (Signed)
Let patein know that BMD results from 04/05/14 show a T Score; spine: -2.0; left hip neck -1.9; right hip neck -1.5; left radius -3.9.  The lowest measurement is at the left forearm.  In comparison to previous exam from 03/05/2012 shows a significant increase at the spine and stable at both hips and this range falls into the osteopenic range.  The FRAX score is not calculated.  She is doing well with keeping herself from further loss and must continue to stay active.  If she is still doing the exercises with "Osteo strong" then it must be helping and I would have her to continue.  Recheck again in 2 years.

## 2014-04-11 ENCOUNTER — Telehealth: Payer: Self-pay | Admitting: Gynecologic Oncology

## 2014-04-11 NOTE — Telephone Encounter (Signed)
GYN ONCOLOGY OFFICE VISIT    Beth Simon 74 y.o. female  CC: Endometrial cancer treatment planning post op check  Assessment/Plan:  [name]  is a 74 y.o.  witith Stage IA grade 2 endomerial cancer with 30% myoinvasion and LVSI  Refer to radiation oncology for vaginal cuff brachytherapy Refer for genetic counseling for genetic testing given her personal history of breast and colon cancer and MSI IHC with evidence of MLH1 and PMS2: instability  HPI: [name]  is a 74 y.o.   G1 P1 menopausal.  Noted irregular pelvic cramping after the death of her husband.  Over the past two months she noticed vaginal discharge.  Vaginal ultrasound 01/10/2014 endometrial stripe 14.5mm.  Endometrial mass with cystic changes noted 2x1 cm.  Atrophic ovaries.  Uterine size 6.46x4.21 cm.  EMB 01/10/2014 Complex atypical hyperplasia.  History notable for left breast cancer ER-PR-  treated with radical mastectomy and chemotherapy in 1990.  Subsequently underwent right simple mastectomy 2008 without reconstruction. Denies use of tamoxifen  Reports weight gain since the loss of her husband.  No abdominal bloating, no changes in bowel or rectal habits.    Interval history: She was scheduled for surgery on March 07 2014 however she called and canceled secondary to feeling like she had a cold, bronchitis and feeling run down. The procedure was rescheduled to 03/21/2014 and underwent [place] BSO.  Frozen section notable for grade 1 adenocarcinoma with superficial invasion.    Final pathology IHC Expression Result: MLH1: LOSS OF NUCLEAR EXPRESSION (LESS THAN 5% TUMOR EXPRESSION) MSH2: Preserved nuclear expression (greater 50% tumor expression) MSH6: Preserved nuclear expression (greater 50% tumor expression) PMS2: LOSS OF NUCLEAR EXPRESSION (LESS THAN 5% TUMOR EXPRESSION) * Internal control demonstrates intact nuclear expression Interpretation: ABNORMAL There is loss of the major and minor  MMR proteins MLH1 and PMS2. The loss of expression may be secondary to promoter hyper-methylation, gene mutation or other genetic event. BRAF mutation testing and/or MLH1 methylation testing is indicated. The presence of a BRAF mutation and/or MLH1 hyper-methylation is indicative of a sporadic-type tumor. The absence of either BRAF mutation and/or presence of normal-methylation indicate the possible presence of a hereditary germline mutation (e.g. Lynch syndrome) and referral to genetic counseling is warranted. It is recommended that the loss of protein expression be correlated with molecular based MSI testing. FINAL DIAGNOSIS Diagnosis - INVASIVE ENDOMETRIOID CARCINOMA, FIGO GRADE II, LIMITED TO THE INNER HALF OF THE MYOMETRIUM WITH ANGIOLYMPHATIC INVASION. ADJACENT ENDOMETRIAL POLYP. - LEIOMYOMATA. - CERVIX: BENIGN SQUAMOUS MUCOSA AND ENDOCERVICAL MUCOSA. NO DYSPLASIA OR MALIGNANCY. - BILATERAL OVARIES: BENIGN OVARIAN TISSUE WITH ENDOSALPINGIOSIS, NO ATYPIA OR MALIGNANCY. - BILATERAL FALLOPIAN TUBES: BENIGN FALLOPIAN TUBAL TISSUE, NO ATYPIA OR MALIGNANCY.  Specimen: Uterus, cervix, bilateral ovaries and fallopian tubes. Procedure: Total hysterectomy and bilateral salpingo-oophorectomy. Lymph node sampling performed: No. Specimen integrity: Intact. Maximum tumor size: 3.0 cm. Histologic type: Invasive endometrioid adenocarcinoma. Grade: FIGO Grade II. Myometrial invasion: 0.4 cm where myometrium is 1.2 cm in thickness. (30%) Cervical stromal involvement: No. Extent of involvement of other organs: No. Lymph - vascular invasion: Present. Peritoneal washings: N/A. Lymph nodes: # examined N/A ; # positive N/A.   Social Hx:   History   Social History  . Marital Status: Widowed    Spouse Name: N/A    Number of Children: 1  . Years of Education: N/A   Occupational History  . Not on file.   Social History Main Topics  . Smoking status: Never Smoker   .  Smokeless tobacco:  Never Used  . Alcohol Use: No  . Drug Use: No  . Sexual Activity: No   Other Topics Concern  . Not on file   Social History Narrative  Husband died 04-Sep-2013.  Plans to spend Thanksgiving out of town with her son.  She traveled to [place] to see a sister who had Alzheimer's   Past Surgical History  Procedure Laterality Date  . Excision vaginal cyst    . Breast implant removal  6/94    left breast  . Cataract extraction Bilateral   . Radical mastectomy lnd  1990    left, chemo done  . Breast reconstruction  1990    left  . Mastectomy  10/08/06    right  . Mastectomy Right 2008    prophalactic  . Robotic assisted total hysterectomy with bilateral salpingo oopherectomy Bilateral 03/21/2014    Procedure: ROBOTIC ASSISTED TOTAL HYSTERECTOMY WITH BILATERAL SALPINGO OOPHORECTOMY ;  Surgeon: Beth Morning, MD;  Location: [facility];  Service: Gynecology;  Laterality: Bilateral;     Past Medical Hx:  Past Medical History  Diagnosis Date  . Osteoporosis     on Evista  . Cystitis     cytoxen, had once or twice  . Vasovagal syncopes   . Macular degeneration   . Anemia   . Uterine fibroid   . Complication of anesthesia   . PONV (postoperative nausea and vomiting)   . Breast cancer 1990    bilateral mastectomy, adenoca breast-left MRM, reconstruction, chemo  . Bronchitis last 2 weeks    saw dr Beth Simon 02-28-2014, he said no antibiotics needed, nonproductive cough  . Shoulder pain     taking physical therapy for last few weeks  . Neck pain     taking physictal therapy last 2 weeks    Past Gynecological History: G1P1 Menarche 30 . Long history of irregular and heavy mesnses.  Never used birth control. no h/o abnormal pap test Patient's last menstrual period was 03/31/1988.  Family Hx:  Family History  Problem Relation Age of Onset  . Breast cancer Sister 42    recurrence age 78  . Breast cancer Paternal Grandmother   . Heart disease Mother   . Heart disease Father   .  Hypertension Sister   Maternal aunt breast cancer dx 68y/o Paternal grandmother breast cancer age at dx ?  REVIEW OF SYSTEMS Constitutional  Feels well  Cardiovascular  No chest pain, shortness of breath, or edema  Pulmonary  No cough or wheeze.  Gastro Intestinal  No nausea, vomitting, or diarrhoea. No bright red blood per rectum, no abdominal pain, change in bowel movement, or constipation.  Genito Urinary  No frequency, urgency, dysuria,  Musculo Skeletal  No myalgia, arthralgia, joint swelling or pain  Neurologic  No weakness, numbness, change in gait,  Psychology  No depression, anxiety, insomnia.    PHYSICAL EXAMINATION Vitals:   Neck  Supple without any enlargements.  Cardiovascular  Pulse normal rate, regularity and rhythm. S1 and S2 normal, without any murmur, rub, or gallop.  Lungs  Clear to auscultation bilateraly, without wheezes/crackles/rhonchi. Good air movement.  Skin  No rash/lesions/breakdown  Psychiatry  Alert and oriented to person, place, and time  Abdomen  Normoactive bowel sounds, abdomen soft, non-tender and obese. Surgical  sites intact without evidence of hernia.  Genito Urinary  Vulva/vagina: Normal external female genitalia.  No lesions. No discharge or bleeding.  Bladder/urethra:  No lesions or masses  Cervix: Normal appearing, no lesions.  Uterus: Small, mobile, no parametrial involvement or nodularity.  Adnexa: No palpable masses. Rectal  Good tone, no masses no cul de sac nodularity.  Extremities  No bilateral cyanosis, clubbing or edema. No rash, lesions or petiche.

## 2014-04-12 ENCOUNTER — Other Ambulatory Visit: Payer: Medicare Other

## 2014-04-13 ENCOUNTER — Encounter: Payer: Self-pay | Admitting: Gynecologic Oncology

## 2014-04-13 ENCOUNTER — Ambulatory Visit: Payer: Medicare Other | Attending: Gynecologic Oncology | Admitting: Gynecologic Oncology

## 2014-04-13 ENCOUNTER — Ambulatory Visit: Payer: Medicare Other | Admitting: Podiatrist

## 2014-04-13 VITALS — BP 159/66 | HR 89 | Temp 98.5°F | Resp 18 | Ht 60.0 in | Wt 131.4 lb

## 2014-04-13 DIAGNOSIS — C541 Malignant neoplasm of endometrium: Secondary | ICD-10-CM | POA: Diagnosis not present

## 2014-04-13 DIAGNOSIS — Z85038 Personal history of other malignant neoplasm of large intestine: Secondary | ICD-10-CM

## 2014-04-13 DIAGNOSIS — Z853 Personal history of malignant neoplasm of breast: Secondary | ICD-10-CM

## 2014-04-13 NOTE — Progress Notes (Signed)
GYN ONCOLOGY OFFICE VISIT    Beth Simon 75 y.o. female  CC: Endometrial cancer treatment planning post op check  Assessment/Plan:  [name]  is a 74 y.o.  witith Stage IA grade 2 endomerial cancer with 30% myoinvasion and LVSI. Recommend  Vaginal brachytherapy. Given the absence of LND will collect PET to assess nodes  Refer to radiation oncology for vaginal cuff brachytherapy Follow-up in 3 months  Somatic MLH1 and PMS2 mutations Refer for genetic counseling for genetic testing given her personal history of breast and colon cancer and MSI IHC with evidence of MLH1 and PMS2: instability  HPI: [name]  is a 74 y.o.   G1 P1 menopausal.  Noted irregular pelvic cramping after the death of her husband.  Over the past two months she noticed vaginal discharge.  Vaginal ultrasound 01/10/2014 endometrial stripe 14.89mm.  Endometrial mass with cystic changes noted 2x1 cm.  Atrophic ovaries.  Uterine size 6.46x4.21 cm.  EMB 01/10/2014 Complex atypical hyperplasia.  History notable for left breast cancer ER-PR-  treated with radical mastectomy and chemotherapy in 1990.  Subsequently underwent right simple mastectomy 2008 without reconstruction. Denies use of tamoxifen  Reports weight gain since the loss of her husband.  No abdominal bloating, no changes in bowel or rectal habits.    Interval history: She was scheduled for surgery on March 07 2014 however she called and canceled secondary to feeling like she had a cold, bronchitis and feeling run down. The procedure was rescheduled to 03/21/2014 and underwent [place] BSO.  Frozen section notable for grade 1 adenocarcinoma with superficial invasion.    Final pathology IHC Expression Result: MLH1: LOSS OF NUCLEAR EXPRESSION (LESS THAN 5% TUMOR EXPRESSION) MSH2: Preserved nuclear expression (greater 50% tumor expression) MSH6: Preserved nuclear expression (greater 50% tumor expression) PMS2: LOSS OF NUCLEAR EXPRESSION  (LESS THAN 5% TUMOR EXPRESSION) * Internal control demonstrates intact nuclear expression Interpretation: ABNORMAL There is loss of the major and minor MMR proteins MLH1 and PMS2. The loss of expression may be secondary to promoter hyper-methylation, gene mutation or other genetic event. BRAF mutation testing and/or MLH1 methylation testing is indicated. The presence of a BRAF mutation and/or MLH1 hyper-methylation is indicative of a sporadic-type tumor. The absence of either BRAF mutation and/or presence of normal-methylation indicate the possible presence of a hereditary germline mutation (e.g. Lynch syndrome) and referral to genetic counseling is warranted. It is recommended that the loss of protein expression be correlated with molecular based MSI testing. FINAL DIAGNOSIS Diagnosis - INVASIVE ENDOMETRIOID CARCINOMA, FIGO GRADE II, LIMITED TO THE INNER HALF OF THE MYOMETRIUM WITH ANGIOLYMPHATIC INVASION. ADJACENT ENDOMETRIAL POLYP. - LEIOMYOMATA. - CERVIX: BENIGN SQUAMOUS MUCOSA AND ENDOCERVICAL MUCOSA. NO DYSPLASIA OR MALIGNANCY. - BILATERAL OVARIES: BENIGN OVARIAN TISSUE WITH ENDOSALPINGIOSIS, NO ATYPIA OR MALIGNANCY. - BILATERAL FALLOPIAN TUBES: BENIGN FALLOPIAN TUBAL TISSUE, NO ATYPIA OR MALIGNANCY.  Specimen: Uterus, cervix, bilateral ovaries and fallopian tubes. Procedure: Total hysterectomy and bilateral salpingo-oophorectomy. Lymph node sampling performed: No. Specimen integrity: Intact. Maximum tumor size: 3.0 cm. Histologic type: Invasive endometrioid adenocarcinoma. Grade: FIGO Grade II. Myometrial invasion: 0.4 cm where myometrium is 1.2 cm in thickness. (30%) Cervical stromal involvement: No. Extent of involvement of other organs: No. Lymph - vascular invasion: Present. Peritoneal washings: N/A. Lymph nodes: # examined N/A ; # positive N/A.   Social Hx:   History   Social History  . Marital Status: Widowed    Spouse Name: N/A    Number of Children: 1  .  Years of  Education: N/A   Occupational History  . Not on file.   Social History Main Topics  . Smoking status: Never Smoker   . Smokeless tobacco: Never Used  . Alcohol Use: No  . Drug Use: No  . Sexual Activity: No   Other Topics Concern  . Not on file   Social History Narrative  Husband died 08/27/2013.  Spent Thanksgiving out of town with her son.  She traveled to [place] to see a sister who had Alzheimer's   Past Surgical History  Procedure Laterality Date  . Excision vaginal cyst    . Breast implant removal  6/94    left breast  . Cataract extraction Bilateral   . Radical mastectomy lnd  1990    left, chemo done  . Breast reconstruction  1990    left  . Mastectomy  10/08/06    right  . Mastectomy Right 2008    prophalactic  . Robotic assisted total hysterectomy with bilateral salpingo oopherectomy Bilateral 03/21/2014    Procedure: ROBOTIC ASSISTED TOTAL HYSTERECTOMY WITH BILATERAL SALPINGO OOPHORECTOMY ;  Surgeon: Janie Morning, MD;  Location: [facility];  Service: Gynecology;  Laterality: Bilateral;     Past Medical Hx:  Past Medical History  Diagnosis Date  . Osteoporosis     on Evista  . Cystitis     cytoxen, had once or twice  . Vasovagal syncopes   . Macular degeneration   . Anemia   . Uterine fibroid   . Complication of anesthesia   . PONV (postoperative nausea and vomiting)   . Breast cancer 1990    bilateral mastectomy, adenoca breast-left MRM, reconstruction, chemo  . Bronchitis last 2 weeks    saw dr Felipa Eth 02-28-2014, he said no antibiotics needed, nonproductive cough  . Shoulder pain     taking physical therapy for last few weeks  . Neck pain     taking physictal therapy last 2 weeks    Past Gynecological History: G1P1 Menarche 47 . Long history of irregular and heavy mesnses.  Never used birth control. no h/o abnormal pap test Patient's last menstrual period was 03/31/1988.  Family Hx:  Family History  Problem Relation Age of Onset  . Breast cancer  Sister 88    recurrence age 51  . Breast cancer Paternal Grandmother   . Heart disease Mother   . Heart disease Father   . Hypertension Sister   Maternal aunt breast cancer dx 68y/o Paternal grandmother breast cancer age at dx ?  REVIEW OF SYSTEMS Constitutional  Feels well  Cardiovascular  No chest pain, shortness of breath, or edema, reports non-productive cough c/w bronchitis Pulmonary  No cough or wheeze.  Gastro Intestinal  No nausea, vomitting, or diarrhoea. No bright red blood per rectum, no abdominal pain, change in bowel movement, or constipation.  Genito Urinary  No frequency, urgency, dysuria, reports incontinence with increase in frequency of cough Musculo Skeletal  No myalgia, arthralgia, joint swelling or pain  Neurologic  No weakness, numbness, change in gait,  Psychology  No depression, anxiety, insomnia.    PHYSICAL EXAMINATION Vitals:  BP 159/66 mmHg  Pulse 89  Temp(Src) 98.5 F (36.9 C) (Oral)  Resp 18  Ht 5' (1.524 m)  Wt 131 lb 6.4 oz (59.603 kg)  BMI 25.66 kg/m2  SpO2 100%  LMP 03/31/1988 Neck  Supple without any enlargements.  Cardiovascular  Pulse normal rate, regularity and rhythm.  Lungs  Clear to auscultation bilateraly, without wheezes/crackles/rhonchi. Good air  movement.  Skin  No rash/lesions/breakdown  Psychiatry  Alert and oriented to person, place, and time  Abdomen  Normoactive bowel sounds, abdomen soft, non-tender and obese. Surgical  sites intact without evidence of hernia.  Genito Urinary  Vulva/vagina: Normal external female genitalia.  No lesions. No discharge or bleeding.  Bladder/urethra:  No lesions or masses Extremities  No bilateral cyanosis, clubbing or edema. No rash, lesions or petiche.

## 2014-04-13 NOTE — Patient Instructions (Signed)
We will contact you with the results of your PET scan, which you will have before starting radiation.  You will also receive a phone call from the [facility] to arrange for a genetics appt.  Please call for any questions or concerns.

## 2014-04-17 ENCOUNTER — Telehealth: Payer: Self-pay | Admitting: Genetic Counselor

## 2014-04-17 ENCOUNTER — Ambulatory Visit
Admission: RE | Admit: 2014-04-17 | Discharge: 2014-04-17 | Disposition: A | Discharge status: Home / Self Care | Payer: Medicare Other | Source: Ambulatory Visit | Attending: Nurse Practitioner | Admitting: Nurse Practitioner

## 2014-04-17 ENCOUNTER — Other Ambulatory Visit: Payer: Self-pay | Admitting: Nurse Practitioner

## 2014-04-17 DIAGNOSIS — J069 Acute upper respiratory infection, unspecified: Secondary | ICD-10-CM | POA: Diagnosis not present

## 2014-04-17 DIAGNOSIS — R05 Cough: Secondary | ICD-10-CM | POA: Diagnosis not present

## 2014-04-17 DIAGNOSIS — J984 Other disorders of lung: Secondary | ICD-10-CM | POA: Diagnosis not present

## 2014-04-17 NOTE — Telephone Encounter (Signed)
S/W PATIENT AND GAVE GENETIC APPT FOR 01/25 @ 11 Beth Simon

## 2014-04-19 NOTE — Telephone Encounter (Signed)
Pt notified of results.  She is agreeable with results and plans.  Pt cancelled appt for AEX in 05/2014.  Pt is undergoing treatment for endometrial cancer at the cancer center.  Pt has serveral appt's coming up.  Pt would like you to know her husband passed in 07/2013.  Pt would like a phone call from you when you get a moment.

## 2014-04-20 ENCOUNTER — Ambulatory Visit: Payer: Medicare Other | Admitting: Podiatrist

## 2014-04-21 ENCOUNTER — Ambulatory Visit
Admission: RE | Admit: 2014-04-21 | Discharge: 2014-04-21 | Disposition: A | Discharge status: Home / Self Care | Payer: Medicare Other | Source: Ambulatory Visit | Attending: Radiation Oncology | Admitting: Radiation Oncology

## 2014-04-21 ENCOUNTER — Ambulatory Visit: Payer: Medicare Other

## 2014-04-21 DIAGNOSIS — C541 Malignant neoplasm of endometrium: Secondary | ICD-10-CM | POA: Insufficient documentation

## 2014-04-24 ENCOUNTER — Ambulatory Visit (HOSPITAL_BASED_OUTPATIENT_CLINIC_OR_DEPARTMENT_OTHER): Payer: Medicare Other | Admitting: Genetic Counselor

## 2014-04-24 ENCOUNTER — Encounter: Payer: Self-pay | Admitting: Genetic Counselor

## 2014-04-24 ENCOUNTER — Other Ambulatory Visit: Payer: Medicare Other

## 2014-04-24 DIAGNOSIS — J069 Acute upper respiratory infection, unspecified: Secondary | ICD-10-CM | POA: Diagnosis not present

## 2014-04-24 DIAGNOSIS — Z853 Personal history of malignant neoplasm of breast: Secondary | ICD-10-CM

## 2014-04-24 DIAGNOSIS — Z803 Family history of malignant neoplasm of breast: Secondary | ICD-10-CM

## 2014-04-24 DIAGNOSIS — Z315 Encounter for genetic counseling: Secondary | ICD-10-CM

## 2014-04-24 DIAGNOSIS — C55 Malignant neoplasm of uterus, part unspecified: Secondary | ICD-10-CM | POA: Diagnosis not present

## 2014-04-24 DIAGNOSIS — Z171 Estrogen receptor negative status [ER-]: Secondary | ICD-10-CM | POA: Diagnosis not present

## 2014-04-24 DIAGNOSIS — Z8542 Personal history of malignant neoplasm of other parts of uterus: Secondary | ICD-10-CM | POA: Insufficient documentation

## 2014-04-24 DIAGNOSIS — Z818 Family history of other mental and behavioral disorders: Secondary | ICD-10-CM

## 2014-04-24 NOTE — Progress Notes (Signed)
REFERRING PROVIDER: Hal Stoneking, MD [address] [place], [place] [address]  PRIMARY PROVIDER:  STONEKING,HAL THOMAS, MD  PRIMARY REASON FOR VISIT:  1. Uterine cancer   2. History of breast cancer   3. Family history of breast cancer   4. Family history of dementia      HISTORY OF PRESENT ILLNESS:   Beth Simon, a 74 y.o. female, was seen for a [facility] cancer genetics consultation at the request of Dr. Stoneking due to a personal and family history of cancer.  Beth Simon presents to clinic today to discuss the possibility of a hereditary predisposition to cancer, genetic testing, and to further clarify her future cancer risks, as well as potential cancer risks for family members.   In 1990, at the age of 47, Beth Simon was diagnosed with breast cancer of the left breast.  The tumor was ER-, but she is unsure what PR or Her2 was. This was treated with radical mastectomy and chemotherapy.  In 2008, Beth Simon underwent prophylactic right mastectomy.  In 2015, at the age of 74, Beth Simon was diagnosed with uterine cancer.  The tumor was tested and found to have loss of heterozygosity of MLH1 and PMS2. Beth Simon was recently diagnosed with thyroid nodules, which are being watched.    CANCER HISTORY:   No history exists.     HORMONAL RISK FACTORS:  Menarche was at age 11.  First live birth at age 26.  OCP use for approximately 0 years.  Ovaries intact: no.  Hysterectomy: yes.  Menopausal status: postmenopausal.  HRT use: 0 years. Colonoscopy: yes; 1-2 polyps. Mammogram within the last year: no. Number of breast biopsies: 1. Up to date with pelvic exams:  Yes. Any excessive radiation exposure in the past:  no  Past Medical History  Diagnosis Date  . Osteoporosis     on Evista  . Cystitis     cytoxen, had once or twice  . Vasovagal syncopes   . Macular degeneration   . Anemia   . Uterine fibroid   . Complication of anesthesia   . PONV (postoperative nausea and  vomiting)   . Breast cancer 1990    bilateral mastectomy, adenoca breast-left MRM, reconstruction, chemo  . Bronchitis last 2 weeks    saw dr stoneking 02-28-2014, he said no antibiotics needed, nonproductive cough  . Shoulder pain     taking physical therapy for last few weeks  . Neck pain     taking physictal therapy last 2 weeks  . History of breast cancer   . Uterine cancer 03/21/2014    MLH1/PMS2 LOH  . Family history of breast cancer     Past Surgical History  Procedure Laterality Date  . Excision vaginal cyst    . Breast implant removal  6/94    left breast  . Cataract extraction Bilateral   . Radical mastectomy lnd  1990    left, chemo done  . Breast reconstruction  1990    left  . Mastectomy  10/08/06    right  . Mastectomy Right 2008    prophalactic  . Robotic assisted total hysterectomy with bilateral salpingo oopherectomy Bilateral 03/21/2014    Procedure: ROBOTIC ASSISTED TOTAL HYSTERECTOMY WITH BILATERAL SALPINGO OOPHORECTOMY ;  Surgeon: Wendy Brewster, MD;  Location: [facility];  Service: Gynecology;  Laterality: Bilateral;    History   Social History  . Marital Status: Widowed    Spouse Name: N/A    Number of Children:   1  . Years of Education: N/A   Social History Main Topics  . Smoking status: Never Smoker   . Smokeless tobacco: Never Used  . Alcohol Use: No  . Drug Use: No  . Sexual Activity: No   Other Topics Concern  . None   Social History Narrative     FAMILY HISTORY:  We obtained a detailed, 4-generation family history.  Significant diagnoses are listed below: Family History  Problem Relation Age of Onset  . Breast cancer Sister 45    recurrence age 89  . Breast cancer Paternal Grandmother   . Heart disease Mother   . Heart disease Father   . Hypertension Sister   . Dementia Sister   . Breast cancer Maternal Aunt 68  . Dementia Maternal Aunt     dx in her 42s   Beth Simon has two sisters and one brother.  The brother died during  childbirth from strangulation from the umbilical cord.  One sister was diagnosed with breast cancer at age 45 and died of metastatic cancer at age 50.  Her remaining sister is 62 and has dementia, which was diagnosed in her 77s. Beth Simon mother was one of 41 children.  3 of her siblings died in infancy, one brother died of cancer NOS, one sister had dementia and died at 1, and one sister had breast cancer at 28.  Beth Simon maternal grandparents died of heart disease.  Her father was the youngest of four boys.  His brothers moved to [place] and they lost track of that family.  Her paternal grandmother was diagnosed with breast cancer in her 47s.  Patient's maternal ancestors are of [place] and [place] descent, and paternal ancestors are of [place] and [place] descent. There is no reported Ashkenazi Jewish ancestry. There is no known consanguinity.  GENETIC COUNSELING ASSESSMENT: Beth Simon is a 74 y.o. female with a personal and family history of cancer which somewhat suggestive of a hereditary breast cancer syndrome or possibly Lynch syndrome based on her tumor testing and predisposition to cancer. We, therefore, discussed and recommended the following at today's visit.   DISCUSSION: We reviewed the characteristics, features and inheritance patterns of hereditary cancer syndromes. We discussed Lynch syndrome and explained that the [facility] on her tumor indicated a loss of heterozygosity of MLH1 and PMS2. These results, many times, indicate a sporadic tumor, but sometimes can indicate an MLH1 or PMS2 mutation causing Lynch syndrome.  We reviewed hereditary breast cancer syndromes based on her ER- breast cancer at age 55 and family history of breast cancer.  We also discussed genetic testing, including the appropriate family members to test, the process of testing, insurance coverage and turn-around-time for results. We discussed the implications of a negative, positive and/or variant of uncertain significant  result. We recommended Beth Simon pursue genetic testing for the [facility] gene panel. The [facility] gene panel is performed by [employer], and performs sequencing and deletion/duplication testing of the following 25 genes: APC, ATM, BARD1, BMPR1A, BRCA1, BRCA2, BRIP1, CHD1, CDK4, CDKN2A, CHEK2, EPCAM (large rearrangement only), MLH1, MSH2, MSH6, MUTYH, NBN, PALB2, PMS2, PTEN, RAD51C, RAD51D, SMAD4, STK11, and TP53.   PLAN: After considering the risks, benefits, and limitations,Beth Simon  provided informed consent to pursue genetic testing and the blood sample was sent to [facility] for analysis of the [facility]. Results should be available within approximately 2-3 weeks' time, at which point they will be disclosed by telephone to Beth Simon, as will any additional  recommendations warranted by these results. Beth Simon will receive a summary of her genetic counseling visit and a copy of her results once available. This information will also be available in Epic. We encouraged Beth Simon to remain in contact with cancer genetics annually so that we can continuously update the family history and inform her of any changes in cancer genetics and testing that may be of benefit for her family. Beth Simon's questions were answered to her satisfaction today. Our contact information was provided should additional questions or concerns arise.  Lastly, we encouraged Beth. [place] to remain in contact with cancer genetics annually so that we can continuously update the family history and inform her of any changes in cancer genetics and testing that may be of benefit for this family.   Beth.  Bechard's questions were answered to her satisfaction today. Our contact information was provided should additional questions or concerns arise. Thank you for the referral and allowing us to share in the care of your patient.   Karen P. Powell, Beth, CGC Certified Genetic Counselor [email or web address] phone:  [phone]  The patient was seen for a total of 60 minutes in face-to-face genetic counseling.  This patient was discussed with Drs. Magrinat, Gudena and/or Feng who agrees with the above.    _______________________________________________________________________ For Office Staff:  Number of people involved in session: 1 Was an Intern/ student involved with case: no    

## 2014-04-25 ENCOUNTER — Encounter: Payer: Self-pay | Admitting: Radiation Oncology

## 2014-04-25 DIAGNOSIS — M7981 Nontraumatic hematoma of soft tissue: Secondary | ICD-10-CM | POA: Diagnosis not present

## 2014-04-25 DIAGNOSIS — M79651 Pain in right thigh: Secondary | ICD-10-CM | POA: Diagnosis not present

## 2014-04-25 DIAGNOSIS — M79604 Pain in right leg: Secondary | ICD-10-CM | POA: Diagnosis not present

## 2014-04-25 NOTE — Progress Notes (Signed)
GYN Location of Tumor / Histology: invasive endometrioid carcinoma  Konrad Dolores presented 3 months ago with symptoms of: 2 week hx of brown to pink stating on underwear, and occasionally with wiping. Occasional pelvic discomfort. Was seen by FNP and examined for light brown mucus discharge from the cervix. Pt then had vaginal US/biopsy. Sent to Gyn Onc for  Endometrial complex atypical hyperplasia.  Biopsies of (if applicable) revealed:  49/70/26 Diagnosis Uterus +/- tubes/ovaries, neoplastic - INVASIVE ENDOMETRIOID CARCINOMA, FIGO GRADE II, LIMITED TO THE INNER HALF OF THE MYOMETRIUM WITH ANGIOLYMPHATIC INVASION. ADJACENT ENDOMETRIAL POLYP. - LEIOMYOMATA. - CERVIX: BENIGN SQUAMOUS MUCOSA AND ENDOCERVICAL MUCOSA. NO DYSPLASIA OR MALIGNANCY. - BILATERAL OVARIES: BENIGN OVARIAN TISSUE WITH ENDOSALPINGIOSIS, NO ATYPIA OR MALIGNANCY. - BILATERAL FALLOPIAN TUBES: BENIGN FALLOPIAN TUBAL TISSUE, NO ATYPIA OR MALIGNANCY.  Past/Anticipated interventions by Gyn/Onc surgery, if any: robotic-assisted total hysterectomy with BSO,  Dr Guy Sandifer  Past/Anticipated interventions by medical oncology, if any: Dr Skeet Latch:  Refer to radiation oncology for vaginal cuff brachytherapy. Follow-up in 3 months  Weight changes, if any: no  Bowel/Bladder complaints, if any: no, occasional slight urinary stress incontinence since hysterectomy  Nausea/Vomiting, if any: no  Pain issues, if any:  no  SAFETY ISSUES:  Prior radiation? no  Pacemaker/ICD? no  Possible current pregnancy? no  Is the patient on methotrexate? no  Current Complaints / other details:  Menarche age 74,  G1 P1 , long hx irregular, long menses, last menses 03/31/1988.  No birth control use. Husband deceased 27-Sep-2013. Patient with hx left breast cancer, s/p bilat mastectomies/chemo.  Sister, paternal grandmother, maternal aunt with hx breast cancer.

## 2014-04-26 ENCOUNTER — Other Ambulatory Visit: Payer: Self-pay | Admitting: Radiation Oncology

## 2014-04-26 ENCOUNTER — Ambulatory Visit
Admission: RE | Admit: 2014-04-26 | Discharge: 2014-04-26 | Disposition: A | Discharge status: Home / Self Care | Payer: Medicare Other | Source: Ambulatory Visit | Attending: Radiation Oncology | Admitting: Radiation Oncology

## 2014-04-26 ENCOUNTER — Encounter: Payer: Self-pay | Admitting: Radiation Oncology

## 2014-04-26 VITALS — BP 143/83 | HR 86 | Temp 98.7°F | Resp 20 | Wt 131.3 lb

## 2014-04-26 DIAGNOSIS — C541 Malignant neoplasm of endometrium: Secondary | ICD-10-CM

## 2014-04-26 NOTE — Progress Notes (Signed)
Radiation Oncology         [phone] ________________________________  Initial outpatient Consultation  Name: Beth Simon MRN: [id number]  Date: 04/26/2014  DOB: 02-Mar-1941  UR:[id number],EGB Marcello Moores, MD  Sherol Dade., MD   REFERRING PHYSICIAN: Sherol Dade., MD  DIAGNOSIS:    ICD-9-CM ICD-10-CM   1. Endometrial cancer 182.0 C54.1      FIGO Stage IA;  pT1a, pNX.  HISTORY OF PRESENT ILLNESS::Beth Simon is a 74 y.o. female who presented with irregular pelvic cramping and vaginal discharge.  Diagnosis of endometrial hyperplasia was made.  She then underwent robotic total laparoscopic hysterectomy and BSO on 03-21-14 - revealing pT1a invasive endometriod carcinoma; see path below with LVSI, 33% myometrial invasion, Grade II.  NO nodes sampled.    1-19 CXR negative for signs of acute cardio pulmonary disease. She is recovering from bronchitis. Still with a cough.  PET pending for 05/01/14. Ordered by Dr Skeet Latch.   Weight changes, if any: no  Bowel/Bladder complaints, if any: no, occasional slight urinary stress incontinence since hysterectomy  Nausea/Vomiting, if any: no  Pain issues, if any:  no  SAFETY ISSUES:  Prior radiation? no  Pacemaker/ICD? no  Possible current pregnancy? no  Is the patient on methotrexate? no  Current Complaints / other details:  Menarche age 58,  G1 P1 , long hx irregular, long menses, last menses 03/31/1988.  No birth control use. Husband deceased 08-15-13. Patient with hx left breast cancer, s/p bilat mastectomies/chemo. No RT.   PATHOLOGY: Uterus +/- tubes/ovaries, neoplastic   - INVASIVE ENDOMETRIOID CARCINOMA, FIGO GRADE II, LIMITED TO THE INNER HALF OF THE MYOMETRIUM WITH ANGIOLYMPHATIC INVASION. ADJACENT ENDOMETRIAL POLYP. - LEIOMYOMATA. - CERVIX: BENIGN SQUAMOUS MUCOSA AND ENDOCERVICAL MUCOSA. NO DYSPLASIA OR MALIGNANCY. - BILATERAL OVARIES: BENIGN OVARIAN TISSUE WITH ENDOSALPINGIOSIS, NO ATYPIA OR MALIGNANCY. -  BILATERAL FALLOPIAN TUBES: BENIGN FALLOPIAN TUBAL TISSUE, NO ATYPIA OR MALIGNANCY. - PLEASE SEE ONCOLOGY TEMPLATE. Microscopic Comment ONCOLOGY TABLE-UTERUS, CARCINOMA Specimen: Uterus, cervix, bilateral ovaries and fallopian tubes. Procedure: Total hysterectomy and bilateral salpingo-oophorectomy. Lymph node sampling performed: No. Specimen integrity: Intact. Maximum tumor size: 3.0 cm. Histologic type: Invasive endometrioid adenocarcinoma. Grade: FIGO Grade II. Myometrial invasion: 0.4 cm where myometrium is 1.2 cm in thickness. Cervical stromal involvement: No. Extent of involvement of other organs: No. Lymph - vascular invasion: Present. Peritoneal washings: N/A. Lymph nodes: # examined N/A ; # positive N/A. Other (specify involvement and site): No. TNM code: pT1a, pNX. FIGO Stage (based on pathologic findings, needs clinical correlation): 1A. (HCL:ds 03/22/14) Aldona Bar MD Pathologist, Electronic Signature  PREVIOUS RADIATION THERAPY: No  PAST MEDICAL HISTORY:  has a past medical history of Osteoporosis; Cystitis; Vasovagal syncopes; Macular degeneration; Anemia; Uterine fibroid; Complication of anesthesia; PONV (postoperative nausea and vomiting); Breast cancer (1990); Bronchitis (last 2 weeks); Shoulder pain; Neck pain; History of breast cancer; Uterine cancer (03/21/2014); and Family history of breast cancer.    PAST SURGICAL HISTORY: Past Surgical History  Procedure Laterality Date  . Excision vaginal cyst    . Breast implant removal  6/94    left breast  . Cataract extraction Bilateral   . Radical mastectomy lnd  1990    left, chemo done  . Breast reconstruction  1990    left  . Mastectomy  10/08/06    right  . Mastectomy Right 2008    prophalactic  . Robotic assisted total hysterectomy with bilateral salpingo oopherectomy Bilateral 03/21/2014    Procedure: ROBOTIC ASSISTED TOTAL HYSTERECTOMY WITH BILATERAL  SALPINGO OOPHORECTOMY ;  Surgeon: Janie Morning,  MD;  Location: [facility];  Service: Gynecology;  Laterality: Bilateral;  . Abdominal hysterectomy  02/2014    FAMILY HISTORY: family history includes Breast cancer in her paternal grandmother; Breast cancer (age of onset: 13) in her sister; Breast cancer (age of onset: 41) in her maternal aunt; Dementia in her maternal aunt and sister; Heart disease in her father and mother; Hypertension in her sister.  SOCIAL HISTORY:  reports that she has never smoked. She has never used smokeless tobacco. She reports that she does not drink alcohol or use illicit drugs. Originally she is from [place].  ALLERGIES: Novocain  MEDICATIONS:  Current Outpatient Prescriptions  Medication Sig Dispense Refill  . Coenzyme Q10 (COQ10 PO) Take 1 tablet by mouth every morning.     . Cyanocobalamin (VITAMIN B 12 PO) Take 1 tablet by mouth every morning.     . denosumab (PROLIA) 60 MG/ML SOLN injection Inject 60 mg into the skin every 6 (six) months. Administer in upper arm, thigh, or abdomen    . Flaxseed, Linseed, (FLAX SEEDS PO) Take 1 scoop by mouth every morning. Ground flaxseed in cereal.    . Glucosamine HCl (GLUCOSAMINE PO) Take 1 tablet by mouth every morning.     Marland Kitchen guaiFENesin (MUCINEX) 600 MG 12 hr tablet Take 600 mg by mouth 2 (two) times daily as needed.    Marland Kitchen MELATONIN PO Take 1 tablet by mouth at bedtime as needed (sleep.).     Marland Kitchen Omega-3 Fatty Acids (FISH OIL PO) Take 1 capsule by mouth every morning.     Marland Kitchen OVER THE COUNTER MEDICATION Take 1 tablet by mouth every morning. Astazanthin. (anti-oxidant)    . raloxifene (EVISTA) 60 MG tablet Take 1 tablet (60 mg total) by mouth daily. (Patient taking differently: Take 60 mg by mouth every morning. ) 90 tablet 3  . VITAMIN D, CHOLECALCIFEROL, PO Take 4,000 Int'l Units by mouth every morning.      No current facility-administered medications for this encounter.    REVIEW OF SYSTEMS:  Notable for that above.   PHYSICAL EXAM:  weight is 131 lb 4.8 oz (59.557  kg). Her oral temperature is 98.7 F (37.1 C). Her blood pressure is 143/83 and her pulse is 86. Her respiration is 20.   General: Alert and oriented, in no acute distress HEENT: Head is normocephalic. Extraocular movements are intact. Oropharynx is clear. Neck: Neck is supple, no palpable cervical or supraclavicular lymphadenopathy. Heart: Regular in rate and rhythm with no murmurs, rubs, or gallops. Chest: Clear to auscultation bilaterally, with no rhonchi, wheezes, or rales. Abdomen: Soft, nontender, nondistended, with no rigidity or guarding. Extremities: No cyanosis or edema. Lymphatics: see Neck Exam; no groin masses Skin: No concerning lesions. Musculoskeletal: symmetric strength and muscle tone throughout. Neurologic: Cranial nerves II through XII are grossly intact. No obvious focalities. Speech is fluent. Coordination is intact. Psychiatric: Judgment and insight are intact. Affect is appropriate. GYN: Sutures visualized at vaginal cuff with no sign of recurrence.     ECOG = 1  0 - Asymptomatic (Fully active, able to carry on all predisease activities without restriction)  1 - Symptomatic but completely ambulatory (Restricted in physically strenuous activity but ambulatory and able to carry out work of a light or sedentary nature. For example, light housework, office work)  2 - Symptomatic, <50% in bed during the day (Ambulatory and capable of all self care but unable to carry out any work activities. Up  and about more than 50% of waking hours)  3 - Symptomatic, >50% in bed, but not bedbound (Capable of only limited self-care, confined to bed or chair 50% or more of waking hours)  4 - Bedbound (Completely disabled. Cannot carry on any self-care. Totally confined to bed or chair)  5 - Death   Eustace Pen MM, Creech RH, Tormey DC, et al. [phone]). "Toxicity and response criteria of the [facility]". [place] Oncol. 5 (6): 649-55   LABORATORY DATA:  Lab  Results  Component Value Date   WBC 17.0* 03/22/2014   HGB 12.1 03/22/2014   HCT 37.3 03/22/2014   MCV 94.9 03/22/2014   PLT 186 03/22/2014   CMP     Component Value Date/Time   NA 136 03/22/2014 0412   K 4.1 03/22/2014 0412   CL 109 03/22/2014 0412   CO2 22 03/22/2014 0412   GLUCOSE 112* 03/22/2014 0412   BUN 9 03/22/2014 0412   CREATININE 0.63 03/22/2014 0412   CALCIUM 8.0* 03/22/2014 0412   PROT 7.6 03/01/2014 1040   ALBUMIN 3.7 03/01/2014 1040   AST 22 03/01/2014 1040   ALT 20 03/01/2014 1040   ALKPHOS 53 03/01/2014 1040   BILITOT <0.2* 03/01/2014 1040   GFRNONAA 86* 03/22/2014 0412   GFRAA >90 03/22/2014 0412         RADIOGRAPHY: Dg Chest 2 View  04/18/2014   CLINICAL DATA:  Persistent cough.  EXAM: CHEST  2 VIEW  COMPARISON:  03/01/2014.  06/13/2011.  FINDINGS: Mediastinum hilar structures normal. Bibasilar pleural parenchymal scarring again noted. No focal infiltrate. No pleural effusion or pneumothorax. Heart size normal. Surgical clips left axilla. No acute bony abnormality.  IMPRESSION: Bibasilar pleural parenchymal scarring, no acute cardiopulmonary disease.   Electronically Signed   By: Marcello Moores  Register   On: 04/18/2014 08:18   US Soft Tissue Head/neck  04/07/2014   CLINICAL DATA:  Multinodular goiter  EXAM: THYROID ULTRASOUND  TECHNIQUE: Ultrasound examination of the thyroid gland and adjacent soft tissues was performed.  COMPARISON:  01/04/2013  FINDINGS: Right thyroid lobe  Measurements: 38 x 20 x 17 mm. Multiple small complex and cystic lesions, largest 10 x 7 x 9 mm, mid lobe.  Left thyroid lobe  Measurements: 42 x 17 x 14 mm. 6 x 4 x 8 mm solid nodule, mid lobe. Multiple small cystic lesions largest 9 x 5 x 7 mm, inferior pole.  Isthmus  Thickness: 3.2 mm.  No nodules visualized.  Lymphadenopathy  None visualized.  IMPRESSION: 1. Normal-sized thyroid with small bilateral cysts and nodule. Findings do not meet current consensus criteria for biopsy. Follow-up by  clinical exam is recommended. If patient has known risk factors for thyroid carcinoma, consider follow-up ultrasound in 12 months. If patient is clinically hyperthyroid, consider nuclear medicine thyroid uptake and scan. This recommendation follows the consensus statement: Management of Thyroid Nodules Detected as [place]: Society of Radiologists in [place]. Radiology 2005; [id number].   Electronically Signed   By: Arne Cleveland M.D.   On: 04/07/2014 13:01      IMPRESSION/PLAN: Today, we discussed the patient's diagnosis of FIGO stage IA high grade endometrial cancer and general treatment for this, highlighting the role of adjuvant vaginal cuff brachytherapy in the management. We discussed the available radiation techniques, and focused on the details of logistics and delivery.   We discussed the risks, benefits, and side effects of radiotherapy (HDR brachytherapy to vaginal cuff, 30 Gy/5 fractions to mucosal surface.) Side  effects may include but not necessarily be limited to: fatigue, vaginal dryness/stenosis, rare internal organ injury.  No guarantees of treatment were given. A consent form was signed and placed in the patient's medical record.  The patient was encouraged to ask questions that I answered to the best of my ability.  She is enthusiastic to proceed.  PET pending for 2/1.  She understands plan may change if stage changes.    Otherwise, plan to proceed with treatment planning and first fraction on Feb 10th.  The IHC on her tumor indicated a loss of heterozygosity of MLH1 and PMS2. Genetic testing pending - blood sample sent. __________________________________________   [name], MD

## 2014-04-27 ENCOUNTER — Telehealth: Payer: Self-pay | Admitting: *Deleted

## 2014-04-27 NOTE — Telephone Encounter (Signed)
Called patient to inform of HDR Case, spoke with patient and she is aware of these appts.

## 2014-05-01 ENCOUNTER — Ambulatory Visit (HOSPITAL_COMMUNITY): Payer: Medicare Other

## 2014-05-04 ENCOUNTER — Ambulatory Visit: Payer: Medicare Other | Admitting: Nurse Practitioner

## 2014-05-05 ENCOUNTER — Encounter (HOSPITAL_COMMUNITY): Payer: Medicare Other

## 2014-05-08 ENCOUNTER — Ambulatory Visit: Payer: Medicare Other | Admitting: Gynecologic Oncology

## 2014-05-08 ENCOUNTER — Ambulatory Visit: Payer: Medicare Other | Admitting: Nutrition

## 2014-05-08 NOTE — Progress Notes (Signed)
74 year old female diagnosed with endometrial cancer.  She will receive radiation therapy from Dr. Isidore Moos.  Past medical history includes osteoporosis, macular degeneration, anemia, and bronchitis.  Medications include coenzyme Q10, vitamin B12, flaxseeds, melatonin, fish oil, and vitamin D.  Labs include glucose 112 on December 23.  Height: 5 feet 0 inches. Weight: 131.3 pounds. Usual body weight: 130-135 pounds. BMI: 25.64.  Patient is requesting information on a healthy diet to increase her energy level.   She would like to focus on her health.   She states she is only having 5 radiation therapy treatments. Dietary recall reveals patient eats 2 meals daily with a small snack midday.  Diet appears to be low in protein and overall calories.  Nutrition diagnosis: Food and nutrition related knowledge deficit related to endometrial cancer as evidenced by no prior need for nutrition related information.  Intervention:  Educated patient on the importance of consuming healthy plant-based diet with adequate calories and protein to promote weight maintenance.   Encouraged patient to consume snacks throughout the day. Educated her on examples of appropriate snacks. Provided samples of oral nutrition supplements if patient chooses to use these for meal replacement. Encouraged her to join yoga or tai chi which is offered by the [facility].  Provided February calendar. Provided fact sheets on ways to get additional calories and protein.  Questions were answered.  Teach back method used.  Monitoring, evaluation, goals: Patient will work to eat more often throughout the day to minimize weight loss but improve overall energy.

## 2014-05-09 ENCOUNTER — Telehealth: Payer: Self-pay | Admitting: Gynecologic Oncology

## 2014-05-09 ENCOUNTER — Encounter (HOSPITAL_COMMUNITY)
Admission: RE | Admit: 2014-05-09 | Discharge: 2014-05-09 | Disposition: A | Discharge status: Home / Self Care | Payer: Medicare Other | Source: Ambulatory Visit | Attending: Gynecologic Oncology | Admitting: Gynecologic Oncology

## 2014-05-09 ENCOUNTER — Telehealth: Payer: Self-pay | Admitting: *Deleted

## 2014-05-09 DIAGNOSIS — C541 Malignant neoplasm of endometrium: Secondary | ICD-10-CM

## 2014-05-09 DIAGNOSIS — Z8544 Personal history of malignant neoplasm of other female genital organs: Secondary | ICD-10-CM | POA: Diagnosis not present

## 2014-05-09 LAB — GLUCOSE, CAPILLARY: Glucose-Capillary: 80 mg/dL (ref 70–99)

## 2014-05-09 MED ORDER — FLUDEOXYGLUCOSE F - 18 (FDG) INJECTION
6.4000 | Freq: Once | INTRAVENOUS | Status: AC | PRN
  Administered 2014-05-09: 6.4 via INTRAVENOUS

## 2014-05-09 NOTE — Telephone Encounter (Signed)
CALLED PATIENT TO REMIND OF HDR VAG. CUFF CASE FOR 05-10-14, SPOKE WITH PATIENT AND SHE IS AWARE OF THIS APPT.

## 2014-05-09 NOTE — Telephone Encounter (Signed)
Patient informed of PET scan results.  No concerns voiced.  Advised to call the office for any needs.

## 2014-05-10 ENCOUNTER — Ambulatory Visit
Admission: RE | Admit: 2014-05-10 | Discharge: 2014-05-10 | Disposition: A | Discharge status: Home / Self Care | Payer: Medicare Other | Source: Ambulatory Visit | Attending: Radiation Oncology | Admitting: Radiation Oncology

## 2014-05-10 VITALS — BP 140/63 | HR 74 | Temp 97.5°F | Resp 20 | Wt 132.8 lb

## 2014-05-10 DIAGNOSIS — C541 Malignant neoplasm of endometrium: Secondary | ICD-10-CM

## 2014-05-10 NOTE — Addendum Note (Signed)
Encounter addended by: Andria Rhein, RN on: 05/10/2014  3:17 PM<BR>     Documentation filed: Charges VN

## 2014-05-10 NOTE — Progress Notes (Signed)
Patient denies pain, fatigue, urinary/bowel issues, vaginal discharge, loss of appetite. Reviewed process for today for vaginal HDR brachytherapy cylinder fitting and ct simulation. Pt verbalized understanding.

## 2014-05-10 NOTE — Progress Notes (Signed)
HDR BRACHYTHERAPY  DIAGNOSIS:     ICD-9-CM ICD-10-CM   1. Endometrial cancer 182.0 C54.1     NARRATIVE: After planning was complete the patient was transferred to the high-dose-rate suite. She was placed in the supine position. The patient's custom vaginal cylinder was placed within the vaginal vault. This was affixed to the CT/MR stabilization plate to prevent slippage.  Verification simulation  A fiducial marker was placed within the vaginal cylinder. Patient then had an AP and lateral film obtained. This was compared to the patient's planning films showing accurate position of the vaginal cylinder for treatment.  High-dose-rate treatment  The remote afterloading catheter was affixed to the vaginal cylinder. The patient then proceeded to undergo her 1st high-dose-rate treatment. She was prescribed a dose of 6 Gy to be delivered to the proximal vaginal mucosal surface. This was achieved with a 2.5 cm diameter cylinder with a treatment length of 4 cm, 9 dwell positions. Total treatment time was 233.1 seconds. Patient tolerated the procedure well. After completion of her therapy a radiation survey was performed documenting return of the iridium source into the GammaMed safe.  -----------------------------------  [name], MD

## 2014-05-10 NOTE — Progress Notes (Signed)
OUTPATIENT  SIMULATION AND TREATMENT PLANNING NOTE HDR BRACHYTHERAPY   ICD-9-CM ICD-10-CM   1. Endometrial cancer 182.0 C54.1     NARRATIVE: The patient was brought to the [facility]. Identity was confirmed. All relevant records and images related to the planned course of therapy were reviewed. The patient freely provided informed written consent to proceed with treatment after reviewing the details related to the planned course of therapy. The consent form was witnessed and verified by the simulation staff. Then, the patient was set-up in a stable reproducible supine position for radiation therapy. The patient's custom vaginal cylinder was placed in the proximal vagina. This was fixed to the CT/MR stabilization plate to prevent slippage. CT images were obtained. Surface markings were placed. The CT images were loaded into the planning software. Then the target and avoidance structures were contoured. Treatment planning then occurred. The radiation prescription was entered and confirmed. I have requested : Brachytherapy Isodose Plan and Dosimetry Calculations to plan the radiation distribution.   PLAN: The patient will receive 30Gy in 5 fractions. Prescription will be to the vaginal mucosal surface. Treatment length will be 4 cm. The patient will be treated with a 2.5 cm diameter cylinder. Iridium 192 will be the high-dose-rate source. -----------------------------------  [name], MD

## 2014-05-10 NOTE — Addendum Note (Signed)
Encounter addended by: Andria Rhein, RN on: 05/10/2014  3:14 PM<BR>     Documentation filed: Charges VN

## 2014-05-10 NOTE — Progress Notes (Signed)
Patient denies pain, fatigue, urinary/bowel issues, vaginal discharge, loss of appetite. Reviewed process for today for vaginal HDR brachytherapy cylinder fitting and ct simulation. Pt verbalized understanding.  She was fitted for her vaginal cylinder without incident. Proceed with simulation.  -----------------------------------  [name], MD

## 2014-05-11 ENCOUNTER — Telehealth: Payer: Self-pay | Admitting: *Deleted

## 2014-05-11 NOTE — Telephone Encounter (Signed)
CALLED PATIENT TO REMIND OF HDR Beth Simon 05-12-14 @ 2 PM, LVM FOR A RETURN CALL

## 2014-05-12 ENCOUNTER — Ambulatory Visit
Admission: RE | Admit: 2014-05-12 | Discharge: 2014-05-12 | Disposition: A | Discharge status: Home / Self Care | Payer: Medicare Other | Source: Ambulatory Visit | Attending: Radiation Oncology | Admitting: Radiation Oncology

## 2014-05-12 ENCOUNTER — Telehealth: Payer: Self-pay | Admitting: *Deleted

## 2014-05-12 DIAGNOSIS — C541 Malignant neoplasm of endometrium: Secondary | ICD-10-CM

## 2014-05-12 NOTE — Telephone Encounter (Signed)
Called patient to remind of HDR Tx. For 05-15-14  @ 2:00 pm, spoke with patient and she is aware of this appt.

## 2014-05-12 NOTE — Progress Notes (Signed)
HDR BRACHYTHERAPY Outpatient  DIAGNOSIS:     ICD-9-CM ICD-10-CM   1. Endometrial cancer 182.0 C54.1     NARRATIVE: After planning was complete the patient was transferred to the high-dose-rate suite. She was placed in the supine position. The patient's custom vaginal cylinder was placed within the vaginal vault. This was affixed to the CT/MR stabilization plate to prevent slippage.  Verification simulation  A fiducial marker was placed within the vaginal cylinder. Patient then had an AP and lateral film obtained. This was compared to the patient's planning films showing accurate position of the vaginal cylinder for treatment.  High-dose-rate treatment  The remote afterloading catheter was affixed to the vaginal cylinder. The patient then proceeded to undergo her 1st high-dose-rate treatment. She was prescribed a dose of 6 Gy to be delivered to the proximal vaginal mucosal surface. This was achieved with a 2.5 cm diameter cylinder with a treatment length of 4 cm, 9 dwell positions. Total treatment time was 237.4 seconds. Patient tolerated the procedure well. After completion of her therapy a radiation survey was performed documenting return of the iridium source into the GammaMed safe.  -----------------------------------  [name], MD

## 2014-05-15 ENCOUNTER — Ambulatory Visit: Payer: Medicare Other | Admitting: Radiation Oncology

## 2014-05-15 ENCOUNTER — Ambulatory Visit: Admission: RE | Admit: 2014-05-15 | Payer: Medicare Other | Source: Ambulatory Visit | Admitting: Radiation Oncology

## 2014-05-17 ENCOUNTER — Telehealth: Payer: Self-pay | Admitting: *Deleted

## 2014-05-17 NOTE — Telephone Encounter (Signed)
CALLED PATIENT TO REMIND OF HDR TX. FOR 05-18-14, SPOKE WITH PATIENT AND SHE IS AWARE OF THIS APPT.

## 2014-05-18 ENCOUNTER — Ambulatory Visit
Admission: RE | Admit: 2014-05-18 | Discharge: 2014-05-18 | Disposition: A | Discharge status: Home / Self Care | Payer: Medicare Other | Source: Ambulatory Visit | Attending: Radiation Oncology | Admitting: Radiation Oncology

## 2014-05-18 DIAGNOSIS — C541 Malignant neoplasm of endometrium: Secondary | ICD-10-CM

## 2014-05-18 NOTE — Progress Notes (Addendum)
HDR BRACHYTHERAPY Outpatient  DIAGNOSIS:     ICD-9-CM ICD-10-CM   1. Endometrial cancer 182.0 C54.1     NARRATIVE: After planning was complete the patient was transferred to the high-dose-rate suite. She was placed in the supine position. The patient's custom vaginal cylinder was placed within the vaginal vault. This was affixed to the CT/MR stabilization plate to prevent slippage.  Verification simulation  A fiducial marker was placed within the vaginal cylinder. Patient then had an AP and lateral film obtained. This was compared to the patient's planning films showing accurate position of the vaginal cylinder for treatment.  High-dose-rate treatment  The remote afterloading catheter was affixed to the vaginal cylinder. The patient then proceeded to undergo her 4th high-dose-rate treatment. She was prescribed a dose of 6 Gy to be delivered to the proximal vaginal mucosal surface. This was achieved with a 2.5 cm diameter cylinder with a treatment length of 4 cm, 9 dwell positions. Patient tolerated the procedure well. After completion of her therapy a radiation survey was performed documenting return of the iridium source into the GammaMed safe.  ---------------------------------- Thea Silversmith, MD

## 2014-05-23 ENCOUNTER — Telehealth: Payer: Self-pay | Admitting: *Deleted

## 2014-05-23 ENCOUNTER — Encounter: Payer: Self-pay | Admitting: Genetic Counselor

## 2014-05-23 ENCOUNTER — Telehealth: Payer: Self-pay | Admitting: Genetic Counselor

## 2014-05-23 DIAGNOSIS — Z1379 Encounter for other screening for genetic and chromosomal anomalies: Secondary | ICD-10-CM | POA: Insufficient documentation

## 2014-05-23 NOTE — Progress Notes (Signed)
HPI: Ms. Schwebach was previously seen in the [facility] due to a personal and family history of cancer and concerns regarding a hereditary predisposition to cancer. Please refer to our prior cancer genetics clinic note for more information regarding Ms. Giampietro's medical, social and family histories, and our assessment and recommendations, at the time. Ms. Croak recent genetic test results were disclosed to her, as were recommendations warranted by these results. These results and recommendations are discussed in more detail below.  GENETIC TEST RESULTS: At the time of Ms. Lyssy's visit, we recommended she pursue genetic testing of the [facility] gene panel. This test, performed by [facility], which included sequencing and deletion/duplication analysis of the following 25 genes:  APC, ATM, BARD1, BMPR1A, BRCA1, BRCA2, BRIP1, CHD1, CDK4, CDKN2A, CHEK2, EPCAM (large rearrangement only), MLH1, MSH2, MSH6, MUTYH, NBN, PALB2, PMS2, PTEN, RAD51C, RAD51D, SMAD4, STK11, and TP53. The report date is: May 04, 2014.  Testing was performed at [facility]. Genetic testing was normal, and did not reveal a deleterious mutation in these genes. The test report has been scanned into EPIC and is located under the Media tab. A copy of the results will be mailed to Ms. Burkle.  We discussed with Ms. Storck that since the current genetic testing is not perfect, it is possible there may be a gene mutation in one of these genes that current testing cannot detect, but that chance is small. We also discussed, that it is possible that another gene that has not yet been discovered, or that we have not yet tested, is responsible for the cancer diagnoses in the family, and it is, therefore, important to remain in touch with cancer genetics in the future so that we can continue to offer Ms. Fedele the most up to date genetic testing.   CANCER SCREENING RECOMMENDATIONS: This result is reassuring and  suggests that Ms. Rarick's personal and family history of cancer was most likely not due to an inherited predisposition associated with one of these genes. Most cancers happen by chance and this negative test, along with details of her family history, suggests that her cancer falls into this category. We, therefore, recommended she continue to follow the cancer management and screening guidelines provided by her oncology and primary providers.   RECOMMENDATIONS FOR FAMILY MEMBERS: Women in this family might be at some increased risk of developing cancer, over the general population risk, simply due to the family history of cancer. We recommended women in this family have a yearly mammogram beginning at age 65, or 4 years younger than the earliest onset of cancer, an an annual clinical breast exam, and perform monthly breast self-exams. Women in this family should also have a gynecological exam as recommended by their primary provider. All family members should have a colonoscopy by age 33.  FOLLOW-UP: Lastly, we discussed with Ms. Rickenbach that cancer genetics is a rapidly advancing field and it is possible that new genetic tests will be appropriate for her and/or her family members in the future. We encouraged her to remain in contact with cancer genetics on an annual basis so we can update her personal and family histories and let her know of advances in cancer genetics that may benefit this family.   Our contact number was provided. Ms. Dieu questions were answered to her satisfaction, and she knows she is welcome to call us at anytime with additional questions or concerns.   Maylon Cos, MS, [facility] Certified Genetic Counselor [email or web address]

## 2014-05-23 NOTE — Telephone Encounter (Signed)
CALLED PATIENT TO REMIND OF HDR [place] 05-24-14 @ 11 AM, SPOKE WITH PATIENT AND SHE IS AWARE OF THIS [place].

## 2014-05-23 NOTE — Telephone Encounter (Signed)
Revealed negative genetic testing results on [facility] report.

## 2014-05-24 ENCOUNTER — Ambulatory Visit
Admission: RE | Admit: 2014-05-24 | Discharge: 2014-05-24 | Disposition: A | Discharge status: Home / Self Care | Payer: Medicare Other | Source: Ambulatory Visit | Attending: Radiation Oncology | Admitting: Radiation Oncology

## 2014-05-24 DIAGNOSIS — C541 Malignant neoplasm of endometrium: Secondary | ICD-10-CM

## 2014-05-24 NOTE — Progress Notes (Signed)
HDR BRACHYTHERAPY Outpatient  DIAGNOSIS:     ICD-9-CM ICD-10-CM   1. Endometrial cancer 182.0 C54.1     NARRATIVE: After planning was complete the patient was transferred to the high-dose-rate suite. She was placed in the supine position. The patient's custom vaginal cylinder was placed within the vaginal vault. This was affixed to the CT/MR stabilization plate to prevent slippage.  Verification simulation  A fiducial marker was placed within the vaginal cylinder. Patient then had an AP and lateral film obtained. This was compared to the patient's planning films showing accurate position of the vaginal cylinder for treatment.  High-dose-rate treatment  The remote afterloading catheter was affixed to the vaginal cylinder. The patient then proceeded to undergo her 1st high-dose-rate treatment. She was prescribed a dose of 6 Gy to be delivered to the proximal vaginal mucosal surface. This was achieved with a 2.5 cm diameter cylinder with a treatment length of 4 cm, 9 dwell positions. Total treatment time was 265.8 seconds. Patient tolerated the procedure well. After completion of her therapy a radiation survey was performed documenting return of the iridium source into the GammaMed safe.  -----------------------------------  [name], MD

## 2014-05-25 ENCOUNTER — Telehealth: Payer: Self-pay | Admitting: Nurse Practitioner

## 2014-05-25 NOTE — Telephone Encounter (Signed)
Patient states she has had a rough year.  Lonnie passed in May of last year.  He was taken to [facility] for a wonderful service.  She also went to [place] to visit her sister who was suffering from [place].  Now diagnosed with UTE cancer and undergoing Radiation.  She is involved with a [employer] group and they are supportive and goes with her to Radiation.  She has no lymph node involvement and PET scan was negative.  She just needed to talk and give me an update.  Best wishes are given.

## 2014-05-26 ENCOUNTER — Telehealth: Payer: Self-pay | Admitting: *Deleted

## 2014-05-26 NOTE — Telephone Encounter (Signed)
CALLED PATIENT TO REMIND OF HDR [place]. FOR 05-29-14, SPOKE WITH PATIENT AND SHE IS AWARE OF THIS APPT.

## 2014-05-29 ENCOUNTER — Ambulatory Visit
Admission: RE | Admit: 2014-05-29 | Discharge: 2014-05-29 | Disposition: A | Discharge status: Home / Self Care | Payer: Medicare Other | Source: Ambulatory Visit | Attending: Radiation Oncology | Admitting: Radiation Oncology

## 2014-05-29 ENCOUNTER — Encounter: Payer: Self-pay | Admitting: Radiation Oncology

## 2014-05-29 DIAGNOSIS — C541 Malignant neoplasm of endometrium: Secondary | ICD-10-CM

## 2014-05-29 NOTE — Progress Notes (Signed)
HDR BRACHYTHERAPY Outpatient  DIAGNOSIS:     ICD-9-CM ICD-10-CM   1. Endometrial cancer 182.0 C54.1     NARRATIVE: After planning was complete the patient was transferred to the high-dose-rate suite. She was placed in the supine position. The patient's custom vaginal cylinder was placed within the vaginal vault. This was affixed to the CT/MR stabilization plate to prevent slippage.  Verification simulation  A fiducial marker was placed within the vaginal cylinder. Patient then had an AP and lateral film obtained. This was compared to the patient's planning films showing accurate position of the vaginal cylinder for treatment.  High-dose-rate treatment  The remote afterloading catheter was affixed to the vaginal cylinder. The patient then proceeded to undergo her 1st high-dose-rate treatment. She was prescribed a dose of 6 Gy to be delivered to the proximal vaginal mucosal surface. This was achieved with a 2.5 cm diameter cylinder with a treatment length of 4 cm, 9 dwell positions. Total treatment time was 278.7 seconds. Patient tolerated the procedure well. After completion of her therapy a radiation survey was performed documenting return of the iridium source into the GammaMed safe.  Plan: Vaginal dilators given. F/u in ~1 mo. -----------------------------------  [name], MD

## 2014-06-01 ENCOUNTER — Telehealth: Payer: Self-pay | Admitting: *Deleted

## 2014-06-01 NOTE — Telephone Encounter (Signed)
Mailed AVS to patient today

## 2014-06-04 NOTE — Progress Notes (Signed)
  Radiation Oncology         (336) [phone] ________________________________  Name: Beth Simon MRN: [id number]  Date: 05/29/2014  DOB: 10/06/1940  End of Treatment Note  Diagnosis:      ICD-9-CM ICD-10-CM   1. Endometrial cancer 182.0 C54.1      FIGO Stage IA;  pT1a, pNX    Indication for treatment:  Post-op, curative    Radiation treatment dates:   05/10/2014, 05/12/2014, 05/18/2014, 05/24/2014, 05/29/2014  Site/dose:   Vaginal Cuff / 30 Gy in 5 fractions  Beams/energy:   Intracavitary cylinder brachytherapy / Iridium HDR  Narrative: The patient tolerated radiation treatment relatively well.      Plan: The patient has completed radiation treatment. She was given a dilator and instructed on how to use it. The patient will return to radiation oncology clinic for routine followup in one month. I advised them to call or return sooner if they have any questions or concerns related to their recovery or treatment.  -----------------------------------  [name], MD

## 2014-06-12 ENCOUNTER — Telehealth: Payer: Self-pay | Admitting: *Deleted

## 2014-06-12 ENCOUNTER — Encounter: Payer: Self-pay | Admitting: Gynecologic Oncology

## 2014-06-12 ENCOUNTER — Ambulatory Visit: Payer: Medicare Other | Attending: Gynecologic Oncology | Admitting: Gynecologic Oncology

## 2014-06-12 VITALS — BP 148/66 | HR 84 | Temp 98.1°F | Resp 18 | Ht 60.0 in | Wt 133.1 lb

## 2014-06-12 DIAGNOSIS — Z90710 Acquired absence of both cervix and uterus: Secondary | ICD-10-CM | POA: Diagnosis not present

## 2014-06-12 DIAGNOSIS — C541 Malignant neoplasm of endometrium: Secondary | ICD-10-CM | POA: Diagnosis not present

## 2014-06-12 DIAGNOSIS — Z923 Personal history of irradiation: Secondary | ICD-10-CM | POA: Insufficient documentation

## 2014-06-12 DIAGNOSIS — N939 Abnormal uterine and vaginal bleeding, unspecified: Secondary | ICD-10-CM

## 2014-06-12 DIAGNOSIS — Z85038 Personal history of other malignant neoplasm of large intestine: Secondary | ICD-10-CM | POA: Diagnosis not present

## 2014-06-12 DIAGNOSIS — Z9013 Acquired absence of bilateral breasts and nipples: Secondary | ICD-10-CM | POA: Insufficient documentation

## 2014-06-12 DIAGNOSIS — Z853 Personal history of malignant neoplasm of breast: Secondary | ICD-10-CM | POA: Diagnosis not present

## 2014-06-12 NOTE — Telephone Encounter (Signed)
Patient completed vaginal HDR on 05/29/14.  Returned call from her; she states she used her vaginal dilator for first time on Saturday. She states it was not painful but uncomfortable on her vaginal side walls similar to when Dr Isidore Moos  would insert the treatment device. She states that last night she awoke at 4 am "with my gown soaked". She states the discharge was not bright red fresh blood but was "dark brown". She denies pain, urinary or bowel issues, other vaginal discharge  prior to this event or since. She is questioning if this "is scar tissue sloughing off". Informed her this RN will discuss her concerns with Dr Isidore Moos and call her back today. Pt verbalized understanding and agreement.

## 2014-06-12 NOTE — Patient Instructions (Signed)
Followup with Dr. Skeet Latch as scheduled. Please call sooner if you experience any further bleeding or concerns.

## 2014-06-12 NOTE — Progress Notes (Signed)
GYN ONCOLOGY OFFICE VISIT    Beth Simon 74 y.o. female  CC:  Chief Complaint  Patient presents with  . Vaginal Bleeding    post radiation for endometrial cancer    Assessment/Plan:  Beth Simon  is a 74 y.o.  witith Stage IA grade 2 endomerial cancer with 30% myoinvasion and LVSI. She is 1 month s/p  vaginal brachytherapy (05/29/14 completed) with new onset vaginal bleeding/loss of fluid.  No evidence for laceration/recurrence/cuff dehiscence on today's exam. Suspect bleeding/loss of fluid was secondary to breaking of vaginal synechiae with vaginal dilator use.  Recommend continued dilator use.  Follow-up in 3 months for cancer surveillance.  Somatic MLH1 and PMS2 mutations Refer for genetic counseling for genetic testing given her personal history of breast and colon cancer and MSI IHC with evidence of MLH1 and PMS2: instability  HPI: [name]  is a 74 y.o.   G1 P1 menopausal.  Noted irregular pelvic cramping after the death of her husband.  Vaginal ultrasound 01/10/2014 endometrial stripe 14.25mm.  Endometrial mass with cystic changes noted 2x1 cm.  Atrophic ovaries.  Uterine size 6.46x4.21 cm.  EMB 01/10/2014 Complex atypical hyperplasia.  History notable for left breast cancer ER-PR-  treated with radical mastectomy and chemotherapy in 1990.  Subsequently underwent right simple mastectomy 2008 without reconstruction. Denies use of tamoxifen  On 03/22/15 she underwent a robotic hysterectomy, BSO. Frozen section revealed grade 1 cancer. Final pathology revealed a grade 2 lesion with LVSI present and she was staged at stage IA. She was recommended vaginal brachytherapy as adjuvant therapy to reduce risk for local recurrence.  Dr Lanell Persons delivered vaginal brachytherapy completed on 05/29/14. Her treatments were without complication. She has been using her vaginal dilator since.   Interval history: 48 hours ago she used her vaginal dilator without issue. 24  hours ago she was sleeping in bed and noticed a gush of fluid that woke her. She traveled to the bathroom and noticed brown spotting on the floor. She took of her nightgown (which she revealed to me in the office today) and noted dark brown staining and yellowish staining of the nightgown. There was no bright red blood. Since that episode she has had no further bleeding in the past 24 hours for an no further loss of fluid. She had a normal bowel movement without difficulty or discomfort. She is not passing flatus vaginally. She is no change in urinary habits.   Final pathology IHC Expression Result: MLH1: LOSS OF NUCLEAR EXPRESSION (LESS THAN 5% TUMOR EXPRESSION) MSH2: Preserved nuclear expression (greater 50% tumor expression) MSH6: Preserved nuclear expression (greater 50% tumor expression) PMS2: LOSS OF NUCLEAR EXPRESSION (LESS THAN 5% TUMOR EXPRESSION) * Internal control demonstrates intact nuclear expression Interpretation: ABNORMAL There is loss of the major and minor MMR proteins MLH1 and PMS2. The loss of expression may be secondary to promoter hyper-methylation, gene mutation or other genetic event. BRAF mutation testing and/or MLH1 methylation testing is indicated. The presence of a BRAF mutation and/or MLH1 hyper-methylation is indicative of a sporadic-type tumor. The absence of either BRAF mutation and/or presence of normal-methylation indicate the possible presence of a hereditary germline mutation (e.g. Lynch syndrome) and referral to genetic counseling is warranted. It is recommended that the loss of protein expression be correlated with molecular based MSI testing. FINAL DIAGNOSIS Diagnosis - INVASIVE ENDOMETRIOID CARCINOMA, FIGO GRADE II, LIMITED TO THE INNER HALF OF THE MYOMETRIUM WITH ANGIOLYMPHATIC INVASION. ADJACENT ENDOMETRIAL POLYP. - LEIOMYOMATA. - CERVIX: BENIGN  SQUAMOUS MUCOSA AND ENDOCERVICAL MUCOSA. NO DYSPLASIA OR MALIGNANCY. - BILATERAL OVARIES: BENIGN OVARIAN TISSUE  WITH ENDOSALPINGIOSIS, NO ATYPIA OR MALIGNANCY. - BILATERAL FALLOPIAN TUBES: BENIGN FALLOPIAN TUBAL TISSUE, NO ATYPIA OR MALIGNANCY.  Specimen: Uterus, cervix, bilateral ovaries and fallopian tubes. Procedure: Total hysterectomy and bilateral salpingo-oophorectomy. Lymph node sampling performed: No. Specimen integrity: Intact. Maximum tumor size: 3.0 cm. Histologic type: Invasive endometrioid adenocarcinoma. Grade: FIGO Grade II. Myometrial invasion: 0.4 cm where myometrium is 1.2 cm in thickness. (30%) Cervical stromal involvement: No. Extent of involvement of other organs: No. Lymph - vascular invasion: Present. Peritoneal washings: N/A. Lymph nodes: # examined N/A ; # positive N/A.   Social Hx:   History   Social History  . Marital Status: Widowed    Spouse Name: N/A  . Number of Children: 1  . Years of Education: N/A   Occupational History  . Not on file.   Social History Main Topics  . Smoking status: Never Smoker   . Smokeless tobacco: Never Used  . Alcohol Use: No  . Drug Use: No  . Sexual Activity: No   Other Topics Concern  . Not on file   Social History Narrative  Husband died 09-01-2013.  Spent Thanksgiving out of town with her son.  She traveled to [place] to see a sister who had Alzheimer's   Past Surgical History  Procedure Laterality Date  . Excision vaginal cyst    . Breast implant removal  6/94    left breast  . Cataract extraction Bilateral   . Radical mastectomy lnd  1990    left, chemo done  . Breast reconstruction  1990    left  . Mastectomy  10/08/06    right  . Mastectomy Right 2008    prophalactic  . Robotic assisted total hysterectomy with bilateral salpingo oopherectomy Bilateral 03/21/2014    Procedure: ROBOTIC ASSISTED TOTAL HYSTERECTOMY WITH BILATERAL SALPINGO OOPHORECTOMY ;  Surgeon: Janie Morning, MD;  Location: [facility];  Service: Gynecology;  Laterality: Bilateral;  . Abdominal hysterectomy  02/2014     Past Medical Hx:  Past  Medical History  Diagnosis Date  . Osteoporosis     on Evista  . Cystitis     cytoxen, had once or twice  . Vasovagal syncopes   . Macular degeneration   . Anemia   . Uterine fibroid   . Complication of anesthesia   . PONV (postoperative nausea and vomiting)   . Breast cancer 1990    bilateral mastectomy, adenoca breast-left MRM, reconstruction, chemo  . Bronchitis last 2 weeks    saw dr Felipa Eth 02-28-2014, he said no antibiotics needed, nonproductive cough  . Shoulder pain     taking physical therapy for last few weeks  . Neck pain     taking physictal therapy last 2 weeks  . History of breast cancer   . Uterine cancer 03/21/2014    MLH1/PMS2 LOH  . Family history of breast cancer     Past Gynecological History: G1P1 Menarche 6 . Long history of irregular and heavy mesnses.  Never used birth control. no h/o abnormal pap test Patient's last menstrual period was 03/31/1988.  Family Hx:  Family History  Problem Relation Age of Onset  . Breast cancer Sister 40    recurrence age 14  . Breast cancer Paternal Grandmother   . Heart disease Mother   . Heart disease Father   . Hypertension Sister   . Dementia Sister   . Breast cancer Maternal Aunt 68  .  Dementia Maternal Aunt     dx in her 68s  Maternal aunt breast cancer dx 68y/o Paternal grandmother breast cancer age at dx ?  REVIEW OF SYSTEMS Constitutional  Feels well  Cardiovascular  No chest pain, shortness of breath, or edema, reports non-productive cough c/w bronchitis Pulmonary  No cough or wheeze.  Gastro Intestinal  No nausea, vomitting, or diarrhoea. No bright red blood per rectum, no abdominal pain, change in bowel movement, or constipation.  Genito Urinary  No frequency, urgency, dysuria, reports incontinence with increase in frequency of cough Musculo Skeletal  No myalgia, arthralgia, joint swelling or pain  Neurologic  No weakness, numbness, change in gait,  Psychology  No depression, anxiety,  insomnia.    PHYSICAL EXAMINATION Vitals:  BP 148/66 mmHg  Pulse 84  Temp(Src) 98.1 F (36.7 C) (Oral)  Resp 18  Ht 5' (1.524 m)  Wt 133 lb 1.6 oz (60.374 kg)  BMI 25.99 kg/m2  LMP 03/31/1988 Neck  Supple without any enlargements.  Cardiovascular  Pulse normal rate, regularity and rhythm.  Lungs  Clear to auscultation bilateraly, without wheezes/crackles/rhonchi. Good air movement.  Skin  No rash/lesions/breakdown  Psychiatry  Alert and oriented to person, place, and time  Abdomen  Normoactive bowel sounds, abdomen soft, non-tender and non obese. Surgical sites well healed.  Genito Urinary  Vulva/vagina: Normal external female genitalia.  No lesions. No discharge or bleeding. The upper vagina is slightly narrowed. A scope it is used to probe the upper vagina and no blood or discharge is noted. Probably there are no lesions palpable or defects in the vaginal cuff or palpable fistulae.  Bladder/urethra:  No lesions or masses Rectal exam: no palpable fistulae, lesions, masses. Extremities  No bilateral cyanosis, clubbing or edema. No rash, lesions or petiche.

## 2014-06-28 ENCOUNTER — Telehealth: Payer: Self-pay | Admitting: Adult Health

## 2014-06-28 NOTE — Telephone Encounter (Signed)
I received a call from Beth Simon regarding her request for a copy of her Survivorship Care Plan.  I let her know that our survivorship program is new here at the cancer center and we are working really hard to expand the program to include our GYN survivors very soon, but are not quite ready for that expansion quite yet.  I offered to create a survivorship care plan for her and compile some survivorship resources for her that she could receive at her next visit to the cancer center on 07/20/14 to see Dr. Skeet Latch.  She agreed to this plan and was very appreciative.  I emailed Beth John, NP to make her aware of the patient's request and what I plan to do to help her.    Mike Craze, NP [place] [phone]

## 2014-06-29 ENCOUNTER — Encounter: Payer: Self-pay | Admitting: *Deleted

## 2014-06-30 ENCOUNTER — Ambulatory Visit: Payer: No Typology Code available for payment source | Admitting: Radiation Oncology

## 2014-07-07 ENCOUNTER — Ambulatory Visit
Admission: RE | Admit: 2014-07-07 | Discharge: 2014-07-07 | Disposition: A | Discharge status: Home / Self Care | Payer: No Typology Code available for payment source | Source: Ambulatory Visit | Attending: Radiation Oncology | Admitting: Radiation Oncology

## 2014-07-07 ENCOUNTER — Encounter: Payer: Self-pay | Admitting: Radiation Oncology

## 2014-07-07 VITALS — BP 147/74 | HR 79 | Temp 98.0°F | Resp 20 | Wt 136.1 lb

## 2014-07-07 DIAGNOSIS — C541 Malignant neoplasm of endometrium: Secondary | ICD-10-CM

## 2014-07-07 NOTE — Progress Notes (Signed)
Radiation Oncology         [phone]) [phone] ________________________________  Name: Beth Simon MRN: [id number]  Date: 07/07/2014  DOB: 01/15/1941  Follow-Up Visit Note  CC: Mathews Argyle, MD  Nancy Marus, MD  Diagnosis and Prior Radiotherapy:       ICD-9-CM ICD-10-CM   1. Endometrial cancer 182.0 C54.1    FIGO Stage IA;  pT1a, pNX    Indication for treatment:  Post-op, curative    Radiation treatment dates:   05/10/2014, 05/12/2014, 05/18/2014, 05/24/2014, 05/29/2014  Site/dose:   Vaginal Cuff / 30 Gy in 5 fractions  Beams/energy:   Intracavitary cylinder brachytherapy / Iridium HDR  Narrative:  The patient returns today for routine follow-up.      Patient denies pain, urinary and bowel issues, loss of appetite. She has very scant clear to light brown vaginal discharge. She occasionally does experience lower abdominal "cramping" she states is not associated with her bowels moving.  She was using her vaginal dilator until 06/12/14. She saw Dr Denman George on that day for large amount vaginal discharge, and she states "Nothing was wrong." She has not used her dilator since, stating she "is afraid to".  She is walking daily but still has fatigue.   Reports intermittent lower abdominal cramping. Sees Dr. Skeet Latch on 07/20/14. Has no further episodes of bleeding since she saw Dr. Denman George. No GU or GI complaints.   BP 147/74 mmHg  Pulse 79  Temp(Src) 98 F (36.7 C) (Oral)  Resp 20  Wt 136 lb 1.6 oz (61.735 kg)  LMP 03/31/1988                  ALLERGIES:  is allergic to novocain.  Meds: Current Outpatient Prescriptions  Medication Sig Dispense Refill  . Coenzyme Q10 (COQ10 PO) Take 1 tablet by mouth every morning.     . Cyanocobalamin (VITAMIN B 12 PO) Take 1 tablet by mouth every morning.     . denosumab (PROLIA) 60 MG/ML SOLN injection Inject 60 mg into the skin every 6 (six) months. Administer in upper arm, thigh, or abdomen    . Flaxseed, Linseed, (FLAX SEEDS PO) Take 1 scoop by  mouth every morning. Ground flaxseed in cereal.    . Glucosamine HCl (GLUCOSAMINE PO) Take 1 tablet by mouth every morning.     Marland Kitchen MELATONIN PO Take 1 tablet by mouth at bedtime as needed (sleep.).     Marland Kitchen Omega-3 Fatty Acids (FISH OIL PO) Take 1 capsule by mouth every morning.     Marland Kitchen OVER THE COUNTER MEDICATION Take 1 tablet by mouth every morning. Astazanthin. (anti-oxidant)    . raloxifene (EVISTA) 60 MG tablet Take 1 tablet (60 mg total) by mouth daily. (Patient taking differently: Take 60 mg by mouth every morning. ) 90 tablet 3  . VITAMIN D, CHOLECALCIFEROL, PO Take 4,000 Int'l Units by mouth every morning.      No current facility-administered medications for this encounter.    Physical Findings: The patient is in no acute distress. Patient is alert and oriented. Wt Readings from Last 3 Encounters:  06/12/14 133 lb 1.6 oz (60.374 kg)  04/13/14 131 lb 6.4 oz (59.603 kg)  03/21/14 132 lb 12.7 oz (60.234 kg)    weight is 136 lb 1.6 oz (61.735 kg). Her oral temperature is 98 F (36.7 C). Her blood pressure is 147/74 and her pulse is 79. Her respiration is 20. Marland Kitchen   General: Alert and oriented, in no acute distress HEENT: Head is  normocephalic. Extraocular movements are intact. Neck: Neck is supple, no palpable cervical or supraclavicular lymphadenopathy. Heart: Regular in rate and rhythm with no murmurs, rubs, or gallops. Chest: Clear to auscultation bilaterally, with no rhonchi, wheezes, or rales. Abdomen: Soft, nontender, nondistended, with no rigidity or guarding. Lymphatics: see Neck Exam; no groin adenopathy  Skin: No concerning lesions.  GYN: no external lesion of genitalia, no vaginal tumor or active bleeding     Lab Findings: Lab Results  Component Value Date   WBC 17.0* 03/22/2014   HGB 12.1 03/22/2014   HCT 37.3 03/22/2014   MCV 94.9 03/22/2014   PLT 186 03/22/2014    Lab Results  Component Value Date   TSH 2.350 10/07/2013    Radiographic Findings: No results  found.  Impression/Plan:   Doing well, NED. Gave 2 smaller dilators to use at home. We will see her in 6 months. Gave her a Counsellor as she is interesting in exercise and losing a few pounds.  This document serves as a record of services personally performed by [name], MD. It was created on her behalf by Pearlie Oyster, a trained medical scribe. The creation of this record is based on the scribe's personal observations and the provider's statements to them. This document has been checked and approved by the attending provider.     _____________________________________   [name], MD

## 2014-07-07 NOTE — Progress Notes (Signed)
Patient denies pain, urinary and bowel issues, loss of appetite. She has very scant clear to light brown vaginal discharge. She occasionally does experience lower abdominal "cramping" she states is not associated with her bowels moving.  She was using her vaginal dilator until 06/12/14. She saw Dr Denman George on that day for large amount vaginal discharge, and she states "Nothing was wrong." She has not used her dilator since, stating she "is afraid to".  She is walking daily but still has fatigue.  BP 147/74 mmHg  Pulse 79  Temp(Src) 98 F (36.7 C) (Oral)  Resp 20  Wt 136 lb 1.6 oz (61.735 kg)  LMP 03/31/1988

## 2014-07-10 DIAGNOSIS — E780 Pure hypercholesterolemia: Secondary | ICD-10-CM | POA: Diagnosis not present

## 2014-07-10 DIAGNOSIS — Z79899 Other long term (current) drug therapy: Secondary | ICD-10-CM | POA: Diagnosis not present

## 2014-07-20 ENCOUNTER — Encounter: Payer: Self-pay | Admitting: Gynecologic Oncology

## 2014-07-20 ENCOUNTER — Ambulatory Visit: Payer: Medicare Other | Attending: Gynecologic Oncology | Admitting: Gynecologic Oncology

## 2014-07-20 VITALS — BP 133/67 | HR 92 | Temp 98.5°F | Resp 18 | Ht 60.0 in | Wt 133.9 lb

## 2014-07-20 DIAGNOSIS — C541 Malignant neoplasm of endometrium: Secondary | ICD-10-CM | POA: Diagnosis not present

## 2014-07-20 DIAGNOSIS — Z8542 Personal history of malignant neoplasm of other parts of uterus: Secondary | ICD-10-CM | POA: Diagnosis not present

## 2014-07-20 NOTE — Patient Instructions (Addendum)
Follow-up in 3 months with Gyn Onc Follow-up with Dr. Isidore Moos 12/2014 Follow up with Edman Circle NP 04/2015    Thank you very much Beth Simon for allowing me to provide care for you today.  I appreciate your confidence in choosing our Gynecologic Oncology team.  If you have any questions about your visit today please call our office and we will get back to you as soon as possible.  Please consider using the website Medlineplus.gov as an Geneticist, molecular.   Francetta Found. Jaelah Hauth MD., PhD Gynecologic Oncology

## 2014-07-20 NOTE — Progress Notes (Signed)
GYN ONCOLOGY OFFICE VISIT    Beth Simon 74 y.o. female  CC:  Chief Complaint  Patient presents with  . endometrial cancer    Assessment/Plan:  Beth Simon  is a 74 y.o.  witith Stage IA grade 2 endomerial cancer with 30% myoinvasion and LVSI. Follow-up in 3 months with Gyn Onc Follow-up with Dr. Isidore Moos 12/2014 Follow up with Kem Boroughs NP 04/2015  Pap test at that visit.   HPI: Beth Simon  is a 74 y.o.   G1 P1 menopausal.  Noted irregular pelvic cramping after the death of her husband.  Vaginal ultrasound 01/10/2014 endometrial stripe 14.4mm.  Endometrial mass with cystic changes noted 2x1 cm.  Atrophic ovaries.  Uterine size 6.46x4.21 cm.  EMB 01/10/2014 Complex atypical hyperplasia.  History notable for left breast cancer ER-PR-  treated with radical mastectomy and chemotherapy in 1990.  Subsequently underwent right simple mastectomy 2008 without reconstruction. Denies use of tamoxifen  On 03/22/15 she underwent a robotic hysterectomy, BSO. Frozen section revealed grade 1 cancer. Final pathology revealed a grade 2 lesion with LVSI present and she was staged at stage IA. She was recommended vaginal brachytherapy as adjuvant therapy to reduce risk for local recurrence.  Dr Lanell Persons delivered vaginal brachytherapy completed on 05/29/14. Her treatments were without complication. She has been using her vaginal dilator since.    Final pathology IHC Expression Result: MLH1: LOSS OF NUCLEAR EXPRESSION (LESS THAN 5% TUMOR EXPRESSION) MSH2: Preserved nuclear expression (greater 50% tumor expression) MSH6: Preserved nuclear expression (greater 50% tumor expression) PMS2: LOSS OF NUCLEAR EXPRESSION (LESS THAN 5% TUMOR EXPRESSION) * Internal control demonstrates intact nuclear expression Interpretation: ABNORMAL There is loss of the major and minor MMR proteins MLH1 and PMS2. The loss of expression may be secondary to promoter hyper-methylation, gene mutation or  other genetic event. BRAF mutation testing and/or MLH1 methylation testing is indicated. The presence of a BRAF mutation and/or MLH1 hyper-methylation is indicative of a sporadic-type tumor. The absence of either BRAF mutation and/or presence of normal-methylation indicate the possible presence of a hereditary germline mutation (e.g. Lynch syndrome) and referral to genetic counseling is warranted. It is recommended that the loss of protein expression be correlated with molecular based MSI testing. FINAL DIAGNOSIS Diagnosis - INVASIVE ENDOMETRIOID CARCINOMA, FIGO GRADE II, LIMITED TO THE INNER HALF OF THE MYOMETRIUM WITH ANGIOLYMPHATIC INVASION. ADJACENT ENDOMETRIAL POLYP. - LEIOMYOMATA. - CERVIX: BENIGN SQUAMOUS MUCOSA AND ENDOCERVICAL MUCOSA. NO DYSPLASIA OR MALIGNANCY. - BILATERAL OVARIES: BENIGN OVARIAN TISSUE WITH ENDOSALPINGIOSIS, NO ATYPIA OR MALIGNANCY. - BILATERAL FALLOPIAN TUBES: BENIGN FALLOPIAN TUBAL TISSUE, NO ATYPIA OR MALIGNANCY.  Specimen: Uterus, cervix, bilateral ovaries and fallopian tubes. Procedure: Total hysterectomy and bilateral salpingo-oophorectomy. Lymph node sampling performed: No. Specimen integrity: Intact. Maximum tumor size: 3.0 cm. Histologic type: Invasive endometrioid adenocarcinoma. Grade: FIGO Grade II. Myometrial invasion: 0.4 cm where myometrium is 1.2 cm in thickness. (30%) Cervical stromal involvement: No. Extent of involvement of other organs: No. Lymph - vascular invasion: Present. Peritoneal washings: N/A. Lymph nodes: # examined N/A ; # positive N/A.  Subsequent genetic testing on the following 25 genes: APC, ATM, BARD1, BMPR1A, BRCA1, BRCA2, BRIP1, CHD1, CDK4, CDKN2A, CHEK2, EPCAM (large rearrangement only), MLH1, MSH2, MSH6, MUTYH, NBN, PALB2, PMS2, PTEN, RAD51C, RAD51D, SMAD4, STK11, and TP53.   Hepler is doing well.  Without complaints Social Hx:   History   Social History  . Marital Status: Widowed    Spouse Name:  N/A  .  Number of Children: 1  . Years of Education: N/A   Occupational History  . Not on file.   Social History Main Topics  . Smoking status: Never Smoker   . Smokeless tobacco: Never Used  . Alcohol Use: No  . Drug Use: No  . Sexual Activity: No   Other Topics Concern  . Not on file   Social History Narrative  Husband died 08/09/13.    She traveled to [place] to see a sister who had Alzheimer's   Past Surgical History  Procedure Laterality Date  . Excision vaginal cyst    . Breast implant removal  6/94    left breast  . Cataract extraction Bilateral   . Radical mastectomy lnd  1990    left, chemo done  . Breast reconstruction  1990    left  . Mastectomy  10/08/06    right  . Mastectomy Right 2008    prophalactic  . Robotic assisted total hysterectomy with bilateral salpingo oopherectomy Bilateral 03/21/2014    Procedure: ROBOTIC ASSISTED TOTAL HYSTERECTOMY WITH BILATERAL SALPINGO OOPHORECTOMY ;  Surgeon: Janie Morning, MD;  Location: [facility];  Service: Gynecology;  Laterality: Bilateral;  . Abdominal hysterectomy  02/2014     Past Medical Hx:  Past Medical History  Diagnosis Date  . Osteoporosis     on Evista  . Cystitis     cytoxen, had once or twice  . Vasovagal syncopes   . Macular degeneration   . Anemia   . Uterine fibroid   . Complication of anesthesia   . PONV (postoperative nausea and vomiting)   . Breast cancer 1990    bilateral mastectomy, adenoca breast-left MRM, reconstruction, chemo  . Bronchitis last 2 weeks    saw dr Felipa Eth 02-28-2014, he said no antibiotics needed, nonproductive cough  . Shoulder pain     taking physical therapy for last few weeks  . Neck pain     taking physictal therapy last 2 weeks  . History of breast cancer   . Uterine cancer 03/21/2014    MLH1/PMS2 LOH  . Family history of breast cancer   . History of radiation therapy 2/10, 2/12, 2/18, 2/24, 05/29/14    vaginal cuff/ 30 Gy/ 5 fx    Past Gynecological History:  G1P1 Menarche 11 . Long history of irregular and heavy mesnses.  Never used birth control. no h/o abnormal pap test Patient's last menstrual period was 03/31/1988.  Family Hx:  Family History  Problem Relation Age of Onset  . Breast cancer Sister 25    recurrence age 79  . Breast cancer Paternal Grandmother   . Heart disease Mother   . Heart disease Father   . Hypertension Sister   . Dementia Sister   . Breast cancer Maternal Aunt 68  . Dementia Maternal Aunt     dx in her 63s  Maternal aunt breast cancer dx 68y/o Paternal grandmother breast cancer age at dx ?  REVIEW OF SYSTEMS Constitutional  Feels well  Cardiovascular  No chest pain, shortness of breath, or edema,  Pulmonary  No cough or wheeze.  Gastro Intestinal  No nausea, vomitting, or diarrhoea. No bright red blood per rectum, no abdominal pain, change in bowel movement, or constipation.  Genito Urinary  No frequency, urgency, dysuria, Musculo Skeletal  No myalgia, arthralgia, joint swelling or pain  Neurologic  No weakness, numbness, change in gait,  Psychology  No depression, anxiety, insomnia.    PHYSICAL EXAMINATION Vitals:  BP 133/67 mmHg  Pulse 92  Temp(Src) 98.5 F (36.9 C) (Oral)  Resp 18  Ht 5' (1.524 m)  Wt 133 lb 14.4 oz (60.737 kg)  BMI 26.15 kg/m2  LMP 03/31/1988 Neck  Supple without any enlargements.  Cardiovascular  Pulse normal rate, regularity and rhythm.  Lungs  Clear to auscultation bilateraly, without wheezes/crackles/rhonchi. Good air movement.  Skin  No rash/lesions/breakdown  Psychiatry  Alert and oriented to person, place, and time  Abdomen  Normoactive bowel sounds, abdomen soft, non-tender and non obese. Surgical sites well healed. No masses or tenderness at the port sites Genito Urinary  Vulva/vagina: Normal external female genitalia.  No lesions. No discharge or bleeding. The upper vagina is slightly narrowed. No blood or discharge is noted. .  Bladder/urethra:  No  lesions or masses Extremities  No bilateral cyanosis, clubbing or edema. No rash, lesions or petiche.

## 2014-08-11 DIAGNOSIS — E559 Vitamin D deficiency, unspecified: Secondary | ICD-10-CM | POA: Diagnosis not present

## 2014-08-11 DIAGNOSIS — M47812 Spondylosis without myelopathy or radiculopathy, cervical region: Secondary | ICD-10-CM | POA: Diagnosis not present

## 2014-08-11 DIAGNOSIS — M810 Age-related osteoporosis without current pathological fracture: Secondary | ICD-10-CM | POA: Diagnosis not present

## 2014-08-11 DIAGNOSIS — R5381 Other malaise: Secondary | ICD-10-CM | POA: Diagnosis not present

## 2014-08-18 DIAGNOSIS — M810 Age-related osteoporosis without current pathological fracture: Secondary | ICD-10-CM | POA: Diagnosis not present

## 2014-08-18 DIAGNOSIS — E559 Vitamin D deficiency, unspecified: Secondary | ICD-10-CM | POA: Diagnosis not present

## 2014-09-05 ENCOUNTER — Telehealth: Payer: Self-pay | Admitting: *Deleted

## 2014-09-05 ENCOUNTER — Other Ambulatory Visit: Payer: Self-pay | Admitting: *Deleted

## 2014-09-05 DIAGNOSIS — C541 Malignant neoplasm of endometrium: Secondary | ICD-10-CM

## 2014-09-05 NOTE — Telephone Encounter (Signed)
Received call from patient requesting a referral to Earlie Counts, PT for pelvic floor rehabilitation. Referral sent via EPIC and patient notified that this has been done.

## 2014-09-12 DIAGNOSIS — I83892 Varicose veins of left lower extremities with other complications: Secondary | ICD-10-CM | POA: Diagnosis not present

## 2014-09-14 ENCOUNTER — Ambulatory Visit: Payer: Medicare Other | Attending: Gynecologic Oncology | Admitting: Physical Therapy

## 2014-09-14 ENCOUNTER — Encounter: Payer: Self-pay | Admitting: Physical Therapy

## 2014-09-14 DIAGNOSIS — Z923 Personal history of irradiation: Secondary | ICD-10-CM | POA: Insufficient documentation

## 2014-09-14 DIAGNOSIS — M79605 Pain in left leg: Secondary | ICD-10-CM | POA: Diagnosis not present

## 2014-09-14 DIAGNOSIS — C541 Malignant neoplasm of endometrium: Secondary | ICD-10-CM | POA: Diagnosis not present

## 2014-09-14 DIAGNOSIS — R159 Full incontinence of feces: Secondary | ICD-10-CM | POA: Diagnosis not present

## 2014-09-14 DIAGNOSIS — Z9221 Personal history of antineoplastic chemotherapy: Secondary | ICD-10-CM | POA: Insufficient documentation

## 2014-09-14 DIAGNOSIS — M6289 Other specified disorders of muscle: Secondary | ICD-10-CM

## 2014-09-14 DIAGNOSIS — N8189 Other female genital prolapse: Secondary | ICD-10-CM | POA: Insufficient documentation

## 2014-09-14 NOTE — Therapy (Signed)
[facility] [address], [place], [place], [address] Phone: [phone]   Fax:  [phone]  Physical Therapy Evaluation  Patient Details  Name: Beth Simon MRN: [id number] Date of Birth: [birth year] Referring Provider:  Janie Morning, MD  Encounter Date: 09/14/2014      PT End of Session - 09/14/14 1048    Visit Number 1   Number of Visits 10  medicare   Date for PT Re-Evaluation 11/09/14   PT Start Time 1030   PT Stop Time 1100   PT Time Calculation (min) 30 min   Activity Tolerance Patient tolerated treatment well   Behavior During Therapy [facility] for tasks assessed/performed      Past Medical History  Diagnosis Date  . Osteoporosis     on Evista  . Cystitis     cytoxen, had once or twice  . Vasovagal syncopes   . Macular degeneration   . Anemia   . Uterine fibroid   . Complication of anesthesia   . PONV (postoperative nausea and vomiting)   . Breast cancer 1990    bilateral mastectomy, adenoca breast-left MRM, reconstruction, chemo  . Bronchitis last 2 weeks    saw dr Felipa Eth 02-28-2014, he said no antibiotics needed, nonproductive cough  . Shoulder pain     taking physical therapy for last few weeks  . Neck pain     taking physictal therapy last 2 weeks  . History of breast cancer   . Uterine cancer 03/21/2014    MLH1/PMS2 LOH  . Family history of breast cancer   . History of radiation therapy 2/10, 2/12, 2/18, 2/24, 05/29/14    vaginal cuff/ 30 Gy/ 5 fx    Past Surgical History  Procedure Laterality Date  . Excision vaginal cyst    . Breast implant removal  6/94    left breast  . Cataract extraction Bilateral   . Radical mastectomy lnd  1990    left, chemo done  . Breast reconstruction  1990    left  . Mastectomy  10/08/06    right  . Mastectomy Right 2008    prophalactic  . Robotic assisted total hysterectomy with bilateral salpingo oopherectomy Bilateral 03/21/2014   Procedure: ROBOTIC ASSISTED TOTAL HYSTERECTOMY WITH BILATERAL SALPINGO OOPHORECTOMY ;  Surgeon: Janie Morning, MD;  Location: [facility];  Service: Gynecology;  Laterality: Bilateral;  . Abdominal hysterectomy  02/2014    There were no vitals filed for this visit.  Visit Diagnosis:  PFD (pelvic floor dysfunction) - Plan: PT plan of care cert/re-cert      Subjective Assessment - 09/14/14 1032    Subjective Patient reports vaginal dryness since after chemotherapy in 1990. Patient reports the urge to void once she goes on and difficutly getting to the commode in time. Patient reports when she has the urge to have a bowel mvement , she will have increased pain and fecal incontinence. for 1 year. Patient reports after she wipes her self after a bowel movement she will have some remaing 1 - 2 hours later. Patient reports after endometrial cancer she had radiation in the pelvic area, 05/01/2014.  After surgery and radiation from the endometrial cancer she was having cramping in the lower abdomen. Patient  was having pain to use dilator that was given to her  but  it was painful and had residue of blood afterwards.    Limitations Other (comment)   Patient Stated Goals Reduce pain, improve vaginal dryness, reduce urge  to void, reduce fecal incontinence   Currently in Pain? Yes   Pain Score 1    Pain Location Abdomen  vaginal    Pain Orientation Mid;Lower   Pain Descriptors / Indicators Cramping  pressure   Pain Type Chronic pain   Pain Onset More than a month ago   Pain Frequency Intermittent   Aggravating Factors  standing when have the urge to have a bowel movement   Pain Relieving Factors having a bowel movement   Multiple Pain Sites No            [facility] PT Assessment - 09/14/14 0001    Assessment   Medical Diagnosis C54.1 Endometreal cancer   Onset Date/Surgical Date 05/01/14   Prior Therapy None   Precautions   Precautions Other (comment)  No ultrasound or heat   Balance Screen   Has  the patient fallen in the past 6 months No   Has the patient had a decrease in activity level because of a fear of falling?  No   Prior Function   Level of Independence Independent   Observation/Other Assessments   Focus on Therapeutic Outcomes (FOTO)  39% limitation                 Pelvic Floor Special Questions - 09/14/14 0001    Currently Sexually Active No   Fecal incontinence Yes  after strong urge of bowel   Skin Integrity Irritaion present at  dryness present around the labia minora and majora   Pelvic Floor Internal Exam Patient confirms identication and approves physical therapist to assess pelvic floor strength and muscle integrity   Exam Type Vaginal;Rectal   Palpation Palpable tendernes located in left puborectalis. Decreased mobility of posterior vaginal wall, urethra, and lower abdominal wall   Strength fair squeeze, definite lift  anal is 3/5   Tone increased tone vaginally                  PT Education - 09/14/14 1505    Education provided No          PT Short Term Goals - 09/14/14 1505    PT SHORT TERM GOAL #1   Title understand how to perform abdominal massage to increased mobility of lower abdominal wall   Time 4   Period Weeks   Status New   PT SHORT TERM GOAL #2   Title understand on how to perform self soft tissue work to perineal area to increase tissue mobility and use a dilator   Time 4   Period Weeks   Status New   PT SHORT TERM GOAL #3   Title understand how to use lubricators and moisturizers to reduce dryness in the vaginal area   Time 4   Period Weeks   Status New   PT SHORT TERM GOAL #4   Title understand how to use dilator correctly to reduce discomfort    Time 4   Period Weeks   Status New           PT Long Term Goals - 09/14/14 1507    PT LONG TERM GOAL #1   Title vaginal dryness decreased >/= 60% to decrease the itching and irritation of the vaginal area   Time 8   Period Weeks   Status New   PT LONG  TERM GOAL #2   Title pelvic floor strength  vaginally and anally 4/5   Time 8   Period Weeks   Status New   PT  LONG TERM GOAL #3   Title fecal leakage decreased >/= 75% due to increase anal sphinter strength   Time 8   Period Weeks   Status New   PT LONG TERM GOAL #4   Title when patient has the urge she is able to wait 5-10  minutes before she has to urinate   Time 8   Period Weeks   Status New   PT LONG TERM GOAL #5   Title lower abdominal discomfort during Simon activities decreased >/= 75% due to increased tissue mobiity of lower abdominal area   Time 8   Period Weeks   Status New               Plan - 09-21-2014 1048    Clinical Impression Statement Patient is a 74 year old female with diagnosis of Endometrial Cancer on 05/10/2014.  Patient reports Patient reports vaginal dryness since after chemotherapy in 1990. Patient reports the urge to void once she goes on and difficutly getting to the commode in time. Patient reports when she has the urge to have a bowel mvement , she will have increased pain and fecal incontinence. for 1 year. Patient reports after she wipes her self after a bowel movement she will have some remaing 1 - 2 hours later. Patient reports after endometrial cancer she had radiation in the pelvic area, 05/01/2014.  After surgery and radiation from the endometrial cancer she was having cramping in the lower abdomen. Patient  was having pain to use dilator that was given to her  but  it was painful and had residue of blood afterwards. FOTO score is 39% for bowel leakage.  Vaginal and anal sphinter strength is 3/5.  Palpable tenderness located in left puborectalis, decreased mobility of urethra, decreased mobility of posterior vaginal wall, and increased tissue tightness in lower abdominal wall. Bilateral hip strength is 4/5.    Pt will benefit from skilled therapeutic intervention in order to improve on the following deficits Decreased strength;Decreased  mobility;Pain;Increased muscle spasms;Decreased endurance   Rehab Potential Good   Clinical Impairments Affecting Rehab Potential None   PT Frequency 2x / week   PT Duration 8 weeks   PT Treatment/Interventions Biofeedback;Therapeutic activities;Therapeutic exercise;Neuromuscular re-education;Manual techniques;Patient/family education   PT Next Visit Plan educate patient on soft tissue work on the vaginal canal, abdominal massage, flexibility exercises   PT Home Exercise Plan flexibility exercises, abdominal massage   Recommended Other Services None   Consulted and Agree with Plan of Care Patient          G-Codes - 09-21-2014 1042    Functional Assessment Tool Used FOTO score is 39% limitation for bowel leakage  goal is 25% limitation   Functional Limitation Other PT primary   Other PT Primary Current Status (G2694) At least 20 percent but less than 40 percent impaired, limited or restricted   Other PT Primary Goal Status (W5462) At least 20 percent but less than 40 percent impaired, limited or restricted       Problem List Patient Active Problem List   Diagnosis Date Noted  . Genetic testing 05/23/2014  . History of breast cancer   . Family history of breast cancer   . Endometrial cancer 03/21/2014  . Uterine cancer 03/21/2014  . Complex endometrial hyperplasia with atypia 02/09/2014  . S/P mastectomy, bilateral 05/02/2013  . Situational stress 05/02/2013  . Osteoporosis, unspecified 05/02/2013  . Breast cancer, left breast 05/02/2013    GRAY,CHERYL,PT 09/21/2014, 3:14 PM  [facility] [address] [place], [place], [address] Phone: [phone]   Fax:  [phone]

## 2014-09-19 ENCOUNTER — Ambulatory Visit: Payer: Medicare Other | Admitting: Physical Therapy

## 2014-09-19 DIAGNOSIS — N8189 Other female genital prolapse: Secondary | ICD-10-CM | POA: Diagnosis not present

## 2014-09-19 DIAGNOSIS — M6289 Other specified disorders of muscle: Secondary | ICD-10-CM

## 2014-09-19 DIAGNOSIS — Z9221 Personal history of antineoplastic chemotherapy: Secondary | ICD-10-CM | POA: Diagnosis not present

## 2014-09-19 DIAGNOSIS — R159 Full incontinence of feces: Secondary | ICD-10-CM | POA: Diagnosis not present

## 2014-09-19 DIAGNOSIS — C541 Malignant neoplasm of endometrium: Secondary | ICD-10-CM | POA: Diagnosis not present

## 2014-09-19 DIAGNOSIS — Z923 Personal history of irradiation: Secondary | ICD-10-CM | POA: Diagnosis not present

## 2014-09-19 NOTE — Therapy (Signed)
[facility] [address], [address] [place], [place], [address] Phone: [phone]   Fax:  [phone]  Physical Therapy Treatment  Patient Details  Name: Beth Simon MRN: [id number] Date of Birth: 10-27-1940 Referring Provider:  Laurette Schimke, MD  Encounter Date: 09/19/2014      PT End of Session - 09/19/14 1227    Visit Number 2   Number of Visits 10  medicare   Date for PT Re-Evaluation 11/09/14   PT Start Time 1145   PT Stop Time 1230   PT Time Calculation (min) 45 min   Activity Tolerance Patient tolerated treatment well   Behavior During Therapy [facility] for tasks assessed/performed      Past Medical History  Diagnosis Date  . Osteoporosis     on Evista  . Cystitis     cytoxen, had once or twice  . Vasovagal syncopes   . Macular degeneration   . Anemia   . Uterine fibroid   . Complication of anesthesia   . PONV (postoperative nausea and vomiting)   . Breast cancer 1990    bilateral mastectomy, adenoca breast-left MRM, reconstruction, chemo  . Bronchitis last 2 weeks    saw dr Pete Glatter 02-28-2014, he said no antibiotics needed, nonproductive cough  . Shoulder pain     taking physical therapy for last few weeks  . Neck pain     taking physictal therapy last 2 weeks  . History of breast cancer   . Uterine cancer 03/21/2014    MLH1/PMS2 LOH  . Family history of breast cancer   . History of radiation therapy 2/10, 2/12, 2/18, 2/24, 05/29/14    vaginal cuff/ 30 Gy/ 5 fx    Past Surgical History  Procedure Laterality Date  . Excision vaginal cyst    . Breast implant removal  6/94    left breast  . Cataract extraction Bilateral   . Radical mastectomy lnd  1990    left, chemo done  . Breast reconstruction  1990    left  . Mastectomy  10/08/06    right  . Mastectomy Right 2008    prophalactic  . Robotic assisted total hysterectomy with bilateral salpingo oopherectomy Bilateral 03/21/2014   Procedure: ROBOTIC ASSISTED TOTAL HYSTERECTOMY WITH BILATERAL SALPINGO OOPHORECTOMY ;  Surgeon: Laurette Schimke, MD;  Location: [facility];  Service: Gynecology;  Laterality: Bilateral;  . Abdominal hysterectomy  02/2014    There were no vitals filed for this visit.  Visit Diagnosis:  PFD (pelvic floor dysfunction)      Subjective Assessment - 09/19/14 1151    Subjective Felt fine after last visit.  I have been trying the dilator and using the samples you gave me. The Slippery lubricant did not work well. I was wondering about the moisturizers. Cramping is slowly going away.    Patient Stated Goals Reduce pain, improve vaginal dryness, reduce urge to void, reduce fecal incontinence   Currently in Pain? Yes   Pain Score 6    Pain Location Vagina   Pain Orientation Mid   Pain Descriptors / Indicators Burning   Pain Type Chronic pain   Pain Frequency Intermittent   Aggravating Factors  when using a dilator   Pain Relieving Factors having a bowel movement   Multiple Pain Sites No                         [facility] Adult PT Treatment/Exercise - 09/19/14 0001  Manual Therapy   Manual Therapy Myofascial release;Scapular mobilization   Myofascial Release lower intestines and bladder area   Scapular Mobilization abdomin and diaphgram                PT Education - 09/19/14 1226    Education provided Yes   Education Details abdominal massage, flexibility exercises, and moisturizers and lubricants   Person(s) Educated Patient   Methods Explanation;Demonstration;Tactile cues;Verbal cues;Handout   Comprehension Returned demonstration;Verbalized understanding          PT Short Term Goals - 09/19/14 1228    PT SHORT TERM GOAL #1   Title understand how to perform abdominal massage to increased mobility of lower abdominal wall   Time 4   Period Weeks   Status Achieved   PT SHORT TERM GOAL #2   Title understand on how to perform self soft tissue work to perineal area  to increase tissue mobility and use a dilator   Time 4   Period Weeks   Status On-going   PT SHORT TERM GOAL #3   Title understand how to use lubricators and moisturizers to reduce dryness in the vaginal area   Time 4   Period Weeks   Status Achieved   PT SHORT TERM GOAL #4   Title understand how to use dilator correctly to reduce discomfort    Time 4   Period Weeks   Status On-going           PT Long Term Goals - 09/14/14 1507    PT LONG TERM GOAL #1   Title vaginal dryness decreased >/= 60% to decrease the itching and irritation of the vaginal area   Time 8   Period Weeks   Status New   PT LONG TERM GOAL #2   Title pelvic floor strength  vaginally and anally 4/5   Time 8   Period Weeks   Status New   PT LONG TERM GOAL #3   Title fecal leakage decreased >/= 75% due to increase anal sphinter strength   Time 8   Period Weeks   Status New   PT LONG TERM GOAL #4   Title when patient has the urge she is able to wait 5-10  minutes before she has to urinate   Time 8   Period Weeks   Status New   PT LONG TERM GOAL #5   Title lower abdominal discomfort during daily activities decreased >/= 75% due to increased tissue mobiity of lower abdominal area   Time [place] - 09/19/14 1229    Clinical Impression Statement Patient has learned about moisturizers and lubricants to improve lubrication in the vaginal area and what products are okay for her to use.  Patient has increased tightness in the abdominal area and lower instestine area causing cramping.  Patient has met STG # 1 and 3.  Patient has not met any other goals due to just starting therapy.    Pt will benefit from skilled therapeutic intervention in order to improve on the following deficits Decreased strength;Decreased mobility;Pain;Increased muscle spasms;Decreased endurance   Rehab Potential Good   Clinical Impairments Affecting Rehab Potential None   PT Frequency 2x /  week   PT Duration 8 weeks   PT Treatment/Interventions Biofeedback;Therapeutic activities;Therapeutic exercise;Neuromuscular re-education;Manual techniques;Patient/family education   PT Next Visit Plan educate patient on soft tissue work on the  vaginal canal,    PT Home Exercise Plan current HEP   Consulted and Agree with Plan of Care Patient        Problem List Patient Active Problem List   Diagnosis Date Noted  . Genetic testing 05/23/2014  . History of breast cancer   . Family history of breast cancer   . Endometrial cancer 03/21/2014  . Uterine cancer 03/21/2014  . Complex endometrial hyperplasia with atypia 02/09/2014  . S/P mastectomy, bilateral 05/02/2013  . Situational stress 05/02/2013  . Osteoporosis, unspecified 05/02/2013  . Breast cancer, left breast 05/02/2013    GRAY,CHERYL,PT 09/19/2014, 12:32 PM  [facility] [address] [place], [place], [address] Phone: [phone]   Fax:  [phone]

## 2014-09-19 NOTE — Patient Instructions (Signed)
Moisturizers . They are used in the vagina to hydrate the mucous membrane that make up the vaginal canal. . Designed to keep a more normal acid balance (ph) . Once placed in the vagina, it will last between two to three days.  . Use 2-3 times per week at bedtime and last longer than 60 min. . Ingredients to avoid is glycerin and fragrance, can increase chance of infection . Should not be used just before sex due to causing irritation . Most are gels administered either in a tampon-shaped applicator or as a vaginal suppository. They are non-hormonal.   Types of Moisturizers . Replens . Samul Dada . Vitamin E vaginal suppositories . Moist Again . Feminease . Coconut oil- can break down condoms . Hyalo  Things to avoid in the vaginal area . Do not use things to irritate the vulvar area . No lotions . No soaps; can use Aveeno or Calendula cleanser if needed. Must be gentle . No deodorants . No douches . Good to sleep without underwear to let the vaginal area to air out . No scrubbing: spread the lips to let warm water rinse over labias and pat dry  Lubrication . Used for intercourse to reduce friction . Avoid ones that have glycerin, warming gels, tingling gels, icing or cooling gel, scented . May need to be reapplied once or several times during sexual activity . Can be applied to both partners genitals prior to vaginal penetration to minimize friction or irritation . Prevent irritation and mucosal tears that cause post coital pain and increased the risk of vaginal and urinary tract infections . Oil-based lubricants cannot be used with condoms due to breaking them down.  Least likely to irritate vaginal tissue.  . Plant based-lubes are safe . Silicone-based lubrication are thicker and last long and used for post-menopausal women Types of Lubricants . Good Clean Love . Slippery Stuff . Sylk . Blossom Organics . Samul Dada . Coconut oil . Aloe Vera . Sliquid  Things to avoid in the  vaginal area . Do not use things to irritate the vulvar area . No lotions . No soaps; can use Aveeno or Calendula cleanser if needed. Must be gentle . No deodorants . No douches . Good to sleep without underwear to let the vaginal area to air out . No scrubbing: spread the lips to let warm water rinse over labias and pat dry  Knee to Chest   Lying supine, bend involved knee to chest _hold 15 sec 2__ times. Repeat with other leg. Do _1__ times per day.  Copyright  VHI. All rights reserved.  Butterfly, Supine   Lie on back, feet together. Lower knees toward floor. Hold _30__ seconds. Repeat _2__ times per session. Do _1__ sessions per day.  Copyright  VHI. All rights reserved.   .piriPiriformis Stretch, Sitting  Can lay on back to perform.  Sit, one ankle on opposite knee, same-side hand on crossed knee. Push down on knee, keeping spine straight. Lean torso forward, with flat back, until tension is felt in hamstrings and gluteals of crossed-leg side. Hold _30__ seconds.  Repeat __2_ times per session. Do _1__ sessions per day.  Copyright  VHI. All rights reserved.  Stretching: Hamstring (Supine)   Supporting right thigh behind knee, slowly straighten knee until stretch is felt in back of thigh. Hold 30____ seconds. Do on both legs.Repeat __2__ times per set. Do __1__ sets per session. Do __1__ sessions per day.  http://orth.exer.us/657   Copyright  VHI. All rights  reserved.    Trunk: Knees to Chest   Lie on firm, flat surface. Keep head and shoulders flat on surface. Tuck hands behind knees and pull to chest. Hold __30__ seconds. Repeat _2___ times. Do _2___ sessions per day. CAUTION: Movement should be gentle and slow.  Copyright  VHI. All rights reserved.   About Abdominal Massage  Abdominal massage, also called external colon massage, is a self-treatment circular massage technique that can reduce and eliminate gas and ease constipation. The colon naturally  contracts in waves in a clockwise direction starting from inside the right hip, moving up toward the ribs, across the belly, and down inside the left hip.  When you perform circular abdominal massage, you help stimulate your colon's normal wave pattern of movement called peristalsis.  It is most beneficial when done after eating.  Positioning You can practice abdominal massage with oil while lying down, or in the shower with soap.  Some people find that it is just as effective to do the massage through clothing while sitting or standing.  How to Massage Start by placing your finger tips or knuckles on your right side, just inside your hip bone.  . Make small circular movements while you move upward toward your rib cage.   . Once you reach the bottom right side of your rib cage, take your circular movements across to the left side of the bottom of your rib cage.  . Next, move downward until you reach the inside of your left hip bone.  This is the path your feces travel in your colon. . Continue to perform your abdominal massage in this pattern for 10 minutes each day.     You can apply as much pressure as is comfortable in your massage.  Start gently and build pressure as you continue to practice.  Notice any areas of pain as you massage; areas of slight pain may be relieved as you massage, but if you have areas of significant or intense pain, consult with your healthcare provider.  Other Considerations . General physical activity including bending and stretching can have a beneficial massage-like effect on the colon.  Deep breathing can also stimulate the colon because breathing deeply activates the same nervous system that supplies the colon.   . Abdominal massage should always be used in combination with a bowel-conscious diet that is high in the proper type of fiber for you, fluids (primarily water), and a regular exercise program.  [facility] [address], [place], [place] [address] Phone # [phone] Fax [phone]

## 2014-09-21 ENCOUNTER — Encounter: Payer: Self-pay | Admitting: Physical Therapy

## 2014-09-21 ENCOUNTER — Ambulatory Visit: Payer: Medicare Other | Admitting: Physical Therapy

## 2014-09-21 DIAGNOSIS — Z923 Personal history of irradiation: Secondary | ICD-10-CM | POA: Diagnosis not present

## 2014-09-21 DIAGNOSIS — N8189 Other female genital prolapse: Secondary | ICD-10-CM | POA: Diagnosis not present

## 2014-09-21 DIAGNOSIS — R159 Full incontinence of feces: Secondary | ICD-10-CM | POA: Diagnosis not present

## 2014-09-21 DIAGNOSIS — C541 Malignant neoplasm of endometrium: Secondary | ICD-10-CM | POA: Diagnosis not present

## 2014-09-21 DIAGNOSIS — M6289 Other specified disorders of muscle: Secondary | ICD-10-CM

## 2014-09-21 DIAGNOSIS — Z9221 Personal history of antineoplastic chemotherapy: Secondary | ICD-10-CM | POA: Diagnosis not present

## 2014-09-21 NOTE — Therapy (Signed)
[facility] [address], [place], [place], [address] Phone: [phone]   Fax:  [phone]  Physical Therapy Treatment  Patient Details  Name: Beth Simon MRN: [id number] Date of Birth: 21-Nov-1940 Referring Provider:  Janie Morning, MD  Encounter Date: 09/21/2014      PT End of Session - 09/21/14 1153    Visit Number 3   Number of Visits 10  Medicare   Date for PT Re-Evaluation 11/09/14   PT Start Time 1150   PT Stop Time 1230   PT Time Calculation (min) 40 min   Equipment Utilized During Treatment Gait belt   Activity Tolerance Patient tolerated treatment well   Behavior During Therapy [facility] for tasks assessed/performed      Past Medical History  Diagnosis Date  . Osteoporosis     on Evista  . Cystitis     cytoxen, had once or twice  . Vasovagal syncopes   . Macular degeneration   . Anemia   . Uterine fibroid   . Complication of anesthesia   . PONV (postoperative nausea and vomiting)   . Breast cancer 1990    bilateral mastectomy, adenoca breast-left MRM, reconstruction, chemo  . Bronchitis last 2 weeks    saw dr Felipa Eth 02-28-2014, he said no antibiotics needed, nonproductive cough  . Shoulder pain     taking physical therapy for last few weeks  . Neck pain     taking physictal therapy last 2 weeks  . History of breast cancer   . Uterine cancer 03/21/2014    MLH1/PMS2 LOH  . Family history of breast cancer   . History of radiation therapy 2/10, 2/12, 2/18, 2/24, 05/29/14    vaginal cuff/ 30 Gy/ 5 fx    Past Surgical History  Procedure Laterality Date  . Excision vaginal cyst    . Breast implant removal  6/94    left breast  . Cataract extraction Bilateral   . Radical mastectomy lnd  1990    left, chemo done  . Breast reconstruction  1990    left  . Mastectomy  10/08/06    right  . Mastectomy Right 2008    prophalactic  . Robotic assisted total hysterectomy with bilateral  salpingo oopherectomy Bilateral 03/21/2014    Procedure: ROBOTIC ASSISTED TOTAL HYSTERECTOMY WITH BILATERAL SALPINGO OOPHORECTOMY ;  Surgeon: Janie Morning, MD;  Location: [facility];  Service: Gynecology;  Laterality: Bilateral;  . Abdominal hysterectomy  02/2014    There were no vitals filed for this visit.  Visit Diagnosis:  PFD (pelvic floor dysfunction)      Subjective Assessment - 09/21/14 1154    Subjective I had burning in the crease of my abdomen and noticed redness. I have not had a chance to use the moisturizer. No abdominal cramping since the soft tissue work.    Limitations Other (comment)   Patient Stated Goals Reduce pain, improve vaginal dryness, reduce urge to void, reduce fecal incontinence   Currently in Pain? Yes   Pain Score 9    Pain Location Vagina   Pain Orientation Mid   Pain Descriptors / Indicators Burning   Pain Type Chronic pain   Pain Onset More than a month ago   Pain Frequency Intermittent   Aggravating Factors  inserting dilator   Pain Relieving Factors having a bowel movement.    Multiple Pain Sites No  Pelvic Floor Special Questions - 09/21/14 0001    Pelvic Floor Internal Exam Patient confirms identication and approves physical therapist to assess pelvic floor strength and muscle integrity   Exam Type Vaginal;Rectal           [facility] Adult PT Treatment/Exercise - 09/21/14 0001    Manual Therapy   Manual Therapy Soft tissue mobilization;Myofascial release;Internal Pelvic Floor   Myofascial Release gentle myofascial work to vaginal canal to improve mobility, changing perssure in areas that are burning   Scapular Mobilization to bil. levator ani gently                PT Education - 09/21/14 1226    Education provided Yes   Education Details verbally instructed patient to air out the area several times per day.           PT Short Term Goals - 09/19/14 1228    PT SHORT TERM GOAL #1   Title  understand how to perform abdominal massage to increased mobility of lower abdominal wall   Time 4   Period Weeks   Status Achieved   PT SHORT TERM GOAL #2   Title understand on how to perform self soft tissue work to perineal area to increase tissue mobility and use a dilator   Time 4   Period Weeks   Status On-going   PT SHORT TERM GOAL #3   Title understand how to use lubricators and moisturizers to reduce dryness in the vaginal area   Time 4   Period Weeks   Status Achieved   PT SHORT TERM GOAL #4   Title understand how to use dilator correctly to reduce discomfort    Time 4   Period Weeks   Status On-going           PT Long Term Goals - 09/14/14 1507    PT LONG TERM GOAL #1   Title vaginal dryness decreased >/= 60% to decrease the itching and irritation of the vaginal area   Time 8   Period Weeks   Status New   PT LONG TERM GOAL #2   Title pelvic floor strength  vaginally and anally 4/5   Time 8   Period Weeks   Status New   PT LONG TERM GOAL #3   Title fecal leakage decreased >/= 75% due to increase anal sphinter strength   Time 8   Period Weeks   Status New   PT LONG TERM GOAL #4   Title when patient has the urge she is able to wait 5-10  minutes before she has to urinate   Time 8   Period Weeks   Status New   PT LONG TERM GOAL #5   Title lower abdominal discomfort during daily activities decreased >/= 75% due to increased tissue mobiity of lower abdominal area   Time 8   Period Weeks   Status New               Plan - 09/21/14 1253    Clinical Impression Statement Patient has a crease in the lower abdomen that became red and opened slightly.  Patient noticed the crease yesterday after taking a shower. Therapist advised patient on airing the area out several times per day. Patient has increased tightness in vaginal canal and burning with palpation especially on the posterior wall of the vaginal. After the soft tissue work there was increased  mobility. Patient has had no lumbar cramping  since she had soft tissue  Pt will benefit from skilled therapeutic intervention in order to improve on the following deficits Decreased strength;Decreased mobility;Pain;Increased muscle spasms;Decreased endurance   Rehab Potential Good   Clinical Impairments Affecting Rehab Potential None   PT Frequency 2x / week   PT Duration 8 weeks   PT Treatment/Interventions Biofeedback;Therapeutic activities;Therapeutic exercise;Neuromuscular re-education;Manual techniques;Patient/family education   PT Next Visit Plan educate patient on soft tissue work on the vaginal canal, pelvic floor surface EMG for relaxation   PT Home Exercise Plan current HEP   Recommended Other Services None   Consulted and Agree with Plan of Care Patient        Problem List Patient Active Problem List   Diagnosis Date Noted  . Genetic testing 05/23/2014  . History of breast cancer   . Family history of breast cancer   . Endometrial cancer 03/21/2014  . Uterine cancer 03/21/2014  . Complex endometrial hyperplasia with atypia 02/09/2014  . S/P mastectomy, bilateral 05/02/2013  . Situational stress 05/02/2013  . Osteoporosis, unspecified 05/02/2013  . Breast cancer, left breast 05/02/2013    GRAY,CHERYL,PT  09/21/2014, 1:09 PM  [facility] [address] [place], [place], [address] Phone: [phone]   Fax:  [phone]

## 2014-09-22 DIAGNOSIS — I8312 Varicose veins of left lower extremity with inflammation: Secondary | ICD-10-CM | POA: Diagnosis not present

## 2014-09-22 DIAGNOSIS — I83892 Varicose veins of left lower extremities with other complications: Secondary | ICD-10-CM | POA: Diagnosis not present

## 2014-09-26 ENCOUNTER — Encounter: Payer: Self-pay | Admitting: Physical Therapy

## 2014-09-26 ENCOUNTER — Ambulatory Visit: Payer: Medicare Other | Admitting: Physical Therapy

## 2014-09-26 DIAGNOSIS — R159 Full incontinence of feces: Secondary | ICD-10-CM | POA: Diagnosis not present

## 2014-09-26 DIAGNOSIS — Z9221 Personal history of antineoplastic chemotherapy: Secondary | ICD-10-CM | POA: Diagnosis not present

## 2014-09-26 DIAGNOSIS — C541 Malignant neoplasm of endometrium: Secondary | ICD-10-CM | POA: Diagnosis not present

## 2014-09-26 DIAGNOSIS — M6289 Other specified disorders of muscle: Secondary | ICD-10-CM

## 2014-09-26 DIAGNOSIS — N8189 Other female genital prolapse: Secondary | ICD-10-CM | POA: Diagnosis not present

## 2014-09-26 DIAGNOSIS — Z923 Personal history of irradiation: Secondary | ICD-10-CM | POA: Diagnosis not present

## 2014-09-26 NOTE — Patient Instructions (Signed)
Slow Contraction: Gravity Eliminated (Hook-Lying)   Lie with hips and knees bent. Slowly squeeze pelvic floor for _10__ seconds at 50% contraction. Rest for _5__ seconds. Repeat _5__ times. Do _3__ times a day. Follow with 5 quick flicks.    Copyright  VHI. All rights reserved.   [facility] [address], [place],  [address] Phone # [phone] Fax [phone]

## 2014-09-26 NOTE — Therapy (Signed)
[facility] [address] [place], [place], [address] Phone: [phone]   Fax:  [phone]  Physical Therapy Treatment  Patient Details  Name: Beth Simon MRN: [id number] Date of Birth: 10-16-1940 Referring Provider:  Janie Morning, MD  Encounter Date: 09/26/2014      PT End of Session - 09/26/14 1046    Visit Number 4   Number of Visits 10  Medicare   Date for PT Re-Evaluation 11/09/14   PT Start Time 2947   PT Stop Time 1055   PT Time Calculation (min) 40 min   Activity Tolerance Patient tolerated treatment well   Behavior During Therapy [facility] for tasks assessed/performed      Past Medical History  Diagnosis Date  . Osteoporosis     on Evista  . Cystitis     cytoxen, had once or twice  . Vasovagal syncopes   . Macular degeneration   . Anemia   . Uterine fibroid   . Complication of anesthesia   . PONV (postoperative nausea and vomiting)   . Breast cancer 1990    bilateral mastectomy, adenoca breast-left MRM, reconstruction, chemo  . Bronchitis last 2 weeks    saw dr Felipa Eth 02-28-2014, he said no antibiotics needed, nonproductive cough  . Shoulder pain     taking physical therapy for last few weeks  . Neck pain     taking physictal therapy last 2 weeks  . History of breast cancer   . Uterine cancer 03/21/2014    MLH1/PMS2 LOH  . Family history of breast cancer   . History of radiation therapy 2/10, 2/12, 2/18, 2/24, 05/29/14    vaginal cuff/ 30 Gy/ 5 fx    Past Surgical History  Procedure Laterality Date  . Excision vaginal cyst    . Breast implant removal  6/94    left breast  . Cataract extraction Bilateral   . Radical mastectomy lnd  1990    left, chemo done  . Breast reconstruction  1990    left  . Mastectomy  10/08/06    right  . Mastectomy Right 2008    prophalactic  . Robotic assisted total hysterectomy with bilateral salpingo oopherectomy Bilateral 03/21/2014   Procedure: ROBOTIC ASSISTED TOTAL HYSTERECTOMY WITH BILATERAL SALPINGO OOPHORECTOMY ;  Surgeon: Janie Morning, MD;  Location: [facility];  Service: Gynecology;  Laterality: Bilateral;  . Abdominal hysterectomy  02/2014    There were no vitals filed for this visit.  Visit Diagnosis:  PFD (pelvic floor dysfunction)      Subjective Assessment - 09/26/14 1024    Subjective The crease is healing. Patient reports internal pain is getting better.    Limitations Other (comment)                      Pelvic Floor Special Questions - 09/26/14 0001    Biofeedback assessment, relaxation   Biofeedback sensor type Surface  vaginally   Biofeedback Activity Quick contraction;10 second hold;Other      Pelvic floor surface EMG in hookly assessment:  Resting level prior to exercise: 1.49uv 3 quick contractions:  Max level 35.37 average 10.78 uv 10 second contraction : max 26.66 uv average 9.72uv fatigue after 2-3 seconds 20 second contraction: average 5.73 uv fatigue after 2-3 seconds resting level after exercise : 1 uv Exercise for control trapezoid program.  Patient able to hold for 10 seconds around 7 uv.  PT Education - 09/26/14 1054    Education provided Yes   Education Details pelvic floor contraction in [employer]) Educated Patient   Methods Explanation;Demonstration;Tactile cues;Verbal cues;Handout   Comprehension Verbalized understanding;Returned demonstration          PT Short Term Goals - 09/26/14 1025    PT SHORT TERM GOAL #4   Title understand how to use dilator correctly to reduce discomfort    Time 4   Period Weeks   Status Achieved           PT Long Term Goals - 09/26/14 1055    PT LONG TERM GOAL #1   Title vaginal dryness decreased >/= 60% to decrease the itching and irritation of the vaginal area   Time 8   Period Weeks   Status On-going  10 % improvement   PT LONG TERM GOAL #2   Title pelvic floor strength  vaginally  and anally 4/5   Time 8   Period Weeks   Status New   PT LONG TERM GOAL #3   Title fecal leakage decreased >/= 75% due to increase anal sphinter strength   Time 8   Period Weeks   Status On-going  50%improvement   PT LONG TERM GOAL #4   Title when patient has the urge she is able to wait 5-10  minutes before she has to urinate   Time 8   Period Weeks   Status New   PT LONG TERM GOAL #5   Title lower abdominal discomfort during daily activities decreased >/= 75% due to increased tissue mobiity of lower abdominal area   Time 8   Period Weeks   Status Achieved               Plan - 09/26/14 1046    Clinical Impression Statement Patient able to rest her pelvic floor at 1.49 uv which is a normal resting tone.  Patient able to contract on average of 10.78 microvolts with quick flicks.  Patient fatigues at 3 seconds with longer max contraction but able to hold for 10 seconds with 50% contraction. Patient  reports her pain is  almost gone in lower abdomen. Patient reports her fecal leakage has decreased by 50%. No change in urge to void.    Pt will benefit from skilled therapeutic intervention in order to improve on the following deficits Decreased strength;Decreased mobility;Pain;Increased muscle spasms;Decreased endurance   Rehab Potential Good   Clinical Impairments Affecting Rehab Potential None   PT Frequency 2x / week   PT Duration 8 weeks   PT Treatment/Interventions Biofeedback;Therapeutic activities;Therapeutic exercise;Neuromuscular re-education;Manual techniques;Patient/family education   PT Next Visit Plan educate patient on soft tissue work on the vaginal canal,   PT Home Exercise Plan current HEP   Consulted and Agree with Plan of Care Patient        Problem List Patient Active Problem List   Diagnosis Date Noted  . Genetic testing 05/23/2014  . History of breast cancer   . Family history of breast cancer   . Endometrial cancer 03/21/2014  . Uterine cancer  03/21/2014  . Complex endometrial hyperplasia with atypia 02/09/2014  . S/P mastectomy, bilateral 05/02/2013  . Situational stress 05/02/2013  . Osteoporosis, unspecified 05/02/2013  . Breast cancer, left breast 05/02/2013    Jomayra Novitsky,PT 09/26/2014, 10:59 AM  [facility] [address] [place], [place], [address] Phone: [phone]   Fax:  [phone]

## 2014-09-28 ENCOUNTER — Encounter: Payer: No Typology Code available for payment source | Admitting: Physical Therapy

## 2014-09-28 ENCOUNTER — Encounter: Payer: Self-pay | Admitting: Gynecologic Oncology

## 2014-09-28 ENCOUNTER — Ambulatory Visit: Payer: Medicare Other | Attending: Gynecologic Oncology | Admitting: Gynecologic Oncology

## 2014-09-28 VITALS — BP 156/67 | HR 92 | Temp 98.2°F | Resp 16 | Ht 60.0 in | Wt 137.4 lb

## 2014-09-28 DIAGNOSIS — C541 Malignant neoplasm of endometrium: Secondary | ICD-10-CM | POA: Insufficient documentation

## 2014-09-28 DIAGNOSIS — N952 Postmenopausal atrophic vaginitis: Secondary | ICD-10-CM | POA: Diagnosis not present

## 2014-09-28 NOTE — Progress Notes (Signed)
GYN ONCOLOGY OFFICE VISIT    Beth Simon 74 y.o. female  CC:  Chief Complaint  Patient presents with  . endometrial cancer    follow-up  vaginal dryness  Assessment/Plan:  [name]  is a 74 y.o.  witith Stage IA grade 2 endomerial cancer with 30% myoinvasion and LVSI. Staged 03/21/14.  Vaginal brachytherapy completed 05/29/2014 Follow-up in 3 months with Gyn Onc Follow-up with Dr. Isidore Moos 12/2014 Follow up with Kem Boroughs NP 04/2015  Pap test at that visit.  Atrophic vaginitis ER- breast cancer and grade II endometrial cancer Will try vaginal moisturizers. Risk and benefits of vaginal estrogen d/w patient.   HPI: [name]  is a 74 y.o.   G1 P1 menopausal.  Noted irregular pelvic cramping after the death of her husband.  Vaginal ultrasound 01/10/2014 endometrial stripe 14.57mm.  Endometrial mass with cystic changes noted 2x1 cm.  Atrophic ovaries.  Uterine size 6.46x4.21 cm.  EMB 01/10/2014 Complex atypical hyperplasia.  History notable for left breast cancer ER-PR-  treated with radical mastectomy and chemotherapy in 1990.  Subsequently underwent right simple mastectomy 2008 without reconstruction. Denies use of tamoxifen  On 03/21/14  she underwent a robotic hysterectomy, BSO. Frozen section revealed grade 1 cancer. Final pathology revealed a grade 2 lesion with LVSI present and she was staged at stage IA. She was recommended vaginal brachytherapy as adjuvant therapy to reduce risk for local recurrence.  Dr Lanell Persons delivered vaginal brachytherapy completed on 05/29/14. Her treatments were without complication. She has been using her vaginal dilator since.    Final pathology IHC Expression Result: MLH1: LOSS OF NUCLEAR EXPRESSION (LESS THAN 5% TUMOR EXPRESSION) MSH2: Preserved nuclear expression (greater 50% tumor expression) MSH6: Preserved nuclear expression (greater 50% tumor expression) PMS2: LOSS OF NUCLEAR EXPRESSION (LESS THAN 5% TUMOR  EXPRESSION) * Internal control demonstrates intact nuclear expression Interpretation: ABNORMAL There is loss of the major and minor MMR proteins MLH1 and PMS2. The loss of expression may be secondary to promoter hyper-methylation, gene mutation or other genetic event. BRAF mutation testing and/or MLH1 methylation testing is indicated. The presence of a BRAF mutation and/or MLH1 hyper-methylation is indicative of a sporadic-type tumor. The absence of either BRAF mutation and/or presence of normal-methylation indicate the possible presence of a hereditary germline mutation (e.g. Lynch syndrome) and referral to genetic counseling is warranted. It is recommended that the loss of protein expression be correlated with molecular based MSI testing. FINAL DIAGNOSIS Diagnosis - INVASIVE ENDOMETRIOID CARCINOMA, FIGO GRADE II, LIMITED TO THE INNER HALF OF THE MYOMETRIUM WITH ANGIOLYMPHATIC INVASION. ADJACENT ENDOMETRIAL POLYP. - LEIOMYOMATA. - CERVIX: BENIGN SQUAMOUS MUCOSA AND ENDOCERVICAL MUCOSA. NO DYSPLASIA OR MALIGNANCY. - BILATERAL OVARIES: BENIGN OVARIAN TISSUE WITH ENDOSALPINGIOSIS, NO ATYPIA OR MALIGNANCY. - BILATERAL FALLOPIAN TUBES: BENIGN FALLOPIAN TUBAL TISSUE, NO ATYPIA OR MALIGNANCY.  Specimen: Uterus, cervix, bilateral ovaries and fallopian tubes. Procedure: Total hysterectomy and bilateral salpingo-oophorectomy. Lymph node sampling performed: No. Specimen integrity: Intact. Maximum tumor size: 3.0 cm. Histologic type: Invasive endometrioid adenocarcinoma. Grade: FIGO Grade II. Myometrial invasion: 0.4 cm where myometrium is 1.2 cm in thickness. (30%) Cervical stromal involvement: No. Extent of involvement of other organs: No. Lymph - vascular invasion: Present. Peritoneal washings: N/A. Lymph nodes: # examined N/A ; # positive N/A.  Subsequent genetic testing on the following 25 genes: APC, ATM, BARD1, BMPR1A, BRCA1, BRCA2, BRIP1, CHD1, CDK4, CDKN2A, CHEK2, EPCAM (large  rearrangement only), MLH1, MSH2, MSH6, MUTYH, NBN, PALB2, PMS2, PTEN, RAD51C, RAD51D, SMAD4, STK11, and  TP53.   NEGATIVE  Beth Simon is doing well.  Without complaints Social Hx:   History   Social History  . Marital Status: Widowed    Spouse Name: N/A  . Number of Children: 1  . Years of Education: N/A   Occupational History  . Not on file.   Social History Main Topics  . Smoking status: Never Smoker   . Smokeless tobacco: Never Used  . Alcohol Use: No  . Drug Use: No  . Sexual Activity: No   Other Topics Concern  . Not on file   Social History Narrative  Husband died Aug 19, 2013.    She traveled to [place] to see a sister who had Alzheimer's .  Has not decided about relocating to near her son's home.  Past Surgical History  Procedure Laterality Date  . Excision vaginal cyst    . Breast implant removal  6/94    left breast  . Cataract extraction Bilateral   . Radical mastectomy lnd  1990    left, chemo done  . Breast reconstruction  1990    left  . Mastectomy  10/08/06    right  . Mastectomy Right 2008    prophalactic  . Robotic assisted total hysterectomy with bilateral salpingo oopherectomy Bilateral 03/21/2014    Procedure: ROBOTIC ASSISTED TOTAL HYSTERECTOMY WITH BILATERAL SALPINGO OOPHORECTOMY ;  Surgeon: Janie Morning, MD;  Location: [facility];  Service: Gynecology;  Laterality: Bilateral;  . Abdominal hysterectomy  02/2014     Past Medical Hx:  Past Medical History  Diagnosis Date  . Osteoporosis     on Evista  . Cystitis     cytoxen, had once or twice  . Vasovagal syncopes   . Macular degeneration   . Anemia   . Uterine fibroid   . Complication of anesthesia   . PONV (postoperative nausea and vomiting)   . Breast cancer 1990    bilateral mastectomy, adenoca breast-left MRM, reconstruction, chemo  . Bronchitis last 2 weeks    saw dr Felipa Eth 02-28-2014, he said no antibiotics needed, nonproductive cough  . Shoulder pain     taking physical  therapy for last few weeks  . Neck pain     taking physictal therapy last 2 weeks  . History of breast cancer   . Uterine cancer 03/21/2014    MLH1/PMS2 LOH  . Family history of breast cancer   . History of radiation therapy 2/10, 2/12, 2/18, 2/24, 05/29/14    vaginal cuff/ 30 Gy/ 5 fx    Past Gynecological History: G1P1 Menarche 11 . Long history of irregular and heavy mesnses.  Never used birth control. no h/o abnormal pap test Patient's last menstrual period was 03/31/1988.  Family Hx:  Family History  Problem Relation Age of Onset  . Breast cancer Sister 43    recurrence age 30  . Breast cancer Paternal Grandmother   . Heart disease Mother   . Heart disease Father   . Hypertension Sister   . Dementia Sister   . Breast cancer Maternal Aunt 68  . Dementia Maternal Aunt     dx in her 64s  Maternal aunt breast cancer dx 68y/o Paternal grandmother breast cancer age at dx ?  REVIEW OF SYSTEMS Constitutional  Feels well  Cardiovascular  No chest pain, shortness of breath, or edema,  Pulmonary  No cough or wheeze.  Gastro Intestinal  No nausea, vomitting, or diarrhoea. No bright red blood per rectum, no abdominal pain, change in bowel  movement, or constipation.  Genito Urinary  No frequency, urgency, dysuria,vaginal dryness with yellow discharge Musculo Skeletal  No myalgia, arthralgia, joint swelling or pain  Neurologic  No weakness, numbness, change in gait,  Psychology  No depression, anxiety, insomnia.    PHYSICAL EXAMINATION Vitals:  BP 156/67 mmHg  Pulse 92  Temp(Src) 98.2 F (36.8 C) (Oral)  Resp 16  Ht 5' (1.524 m)  Wt 137 lb 6.4 oz (62.324 kg)  BMI 26.83 kg/m2  SpO2 97%  LMP 03/31/1988 Neck  Supple without any enlargements.  Cardiovascular  Pulse normal rate, regularity and rhythm.  Lungs  Clear to auscultation bilateraly, without wheezes/crackles/rhonchi. Good air movement.  Skin  No rash/lesions/breakdown  Psychiatry  Alert and oriented to  person, place, and time  Abdomen  Normoactive bowel sounds, abdomen soft, non-tender and non obese. Surgical sites well healed. No masses or tenderness at the port sites Genito Urinary  Vulva/vagina: Normal external female genitalia.  No lesions. Markedly atrophic vagina with non smelling yellow discharge No discharge or bleeding. The upper vagina is slightly narrowed. No blood or discharge is noted. .  Bladder/urethra:  No lesions or masses Extremities  No bilateral cyanosis, clubbing or edema. No rash, lesions or petiche.

## 2014-09-28 NOTE — Patient Instructions (Signed)
No evidence of disease Follow-up with Dr. Isidore Moos 12/2014 Follow up with Kem Boroughs NP 04/2015  Pap test at that visit. Follow-up in 07/2015  with Gyn Onc  Thank you very much Ms. Konrad Dolores for allowing me to provide care for you today.  I appreciate your confidence in choosing our Gynecologic Oncology team.  If you have any questions about your visit today please call our office and we will get back to you as soon as possible.  Please consider using the website Medlineplus.gov as an Geneticist, molecular.   Francetta Found. Lemoyne Scarpati MD., PhD Gynecologic Oncology

## 2014-10-04 ENCOUNTER — Encounter: Payer: Self-pay | Admitting: Physical Therapy

## 2014-10-04 ENCOUNTER — Ambulatory Visit: Payer: Medicare Other | Attending: Gynecologic Oncology | Admitting: Physical Therapy

## 2014-10-04 DIAGNOSIS — M6289 Other specified disorders of muscle: Secondary | ICD-10-CM

## 2014-10-04 DIAGNOSIS — N8184 Pelvic muscle wasting: Secondary | ICD-10-CM | POA: Diagnosis not present

## 2014-10-04 NOTE — Patient Instructions (Addendum)
1. Use coconut oil daly in vaginal canal and outside perineum.  2. Use smaller tampons with plastic applicator instead bigger dilator. Pain should not be more than 3/10 when using dilator. Prior do deep breathing to relax for minutes.  3.Do stretches and tightening pelvic floor muscles prior to using dilator.   STRETCHING THE PELVIC FLOOR MUSCLES NO DILATOR  Supplies . Vaginal lubricant . Mirror (optional) . Gloves (optional) Positioning . Start in a semi-reclined position with your head propped up. Bend your knees and place your thumb or finger at the vaginal opening. Procedure . Apply a moderate amount of lubricant on the outer skin of your vagina, the labia minora.  Apply additional lubricant to your finger. Marland Kitchen Spread the skin away from the vaginal opening. Place the end of your finger at the opening. . Do a maximum contraction of the pelvic floor muscles. Tighten the vagina and the anus maximally and relax. . When you know they are relaxed, gently and slowly insert your finger into your vagina, directing your finger slightly downward, for 2-3 inches of insertion. . Relax and stretch the 6 o'clock position . Hold each stretch for _2 min__ and repeat __1_ time with rest breaks of _30__ seconds between each stretch. . Repeat the stretching in the 4 o'clock and 8 o'clock positions. . Total time should be _6__ minutes, _1__ x per day.  Note the amount of theme your were able to achieve and your tolerance to your finger in your vagina. Once you have accomplished the techniques you may try them in standing with one foot resting on the tub, or in other positions.  This is a good stretch to do in the shower if you don't need to use lubricant   [facility] [address] [place], [place] [address] Phone # [phone] Fax [phone] .

## 2014-10-04 NOTE — Therapy (Signed)
[facility] [address] [place], [place], [address] Phone: [phone]   Fax:  [phone]  Physical Therapy Treatment  Patient Details  Name: Beth Simon MRN: [id number] Date of Birth: [birth year] Referring Provider:  Janie Morning, MD  Encounter Date: 10/04/2014      PT End of Session - 10/04/14 1046    Visit Number 5   Number of Visits 10  Medicare   Date for PT Re-Evaluation 11/09/14   PT Start Time 1020   PT Stop Time 1100   PT Time Calculation (min) 40 min   Activity Tolerance Patient tolerated treatment well   Behavior During Therapy [facility] for tasks assessed/performed      Past Medical History  Diagnosis Date  . Osteoporosis     on Evista  . Cystitis     cytoxen, had once or twice  . Vasovagal syncopes   . Macular degeneration   . Anemia   . Uterine fibroid   . Complication of anesthesia   . PONV (postoperative nausea and vomiting)   . Breast cancer 1990    bilateral mastectomy, adenoca breast-left MRM, reconstruction, chemo  . Bronchitis last 2 weeks    saw dr Felipa Eth 02-28-2014, he said no antibiotics needed, nonproductive cough  . Shoulder pain     taking physical therapy for last few weeks  . Neck pain     taking physictal therapy last 2 weeks  . History of breast cancer   . Uterine cancer 03/21/2014    MLH1/PMS2 LOH  . Family history of breast cancer   . History of radiation therapy 2/10, 2/12, 2/18, 2/24, 05/29/14    vaginal cuff/ 30 Gy/ 5 fx    Past Surgical History  Procedure Laterality Date  . Excision vaginal cyst    . Breast implant removal  6/94    left breast  . Cataract extraction Bilateral   . Radical mastectomy lnd  1990    left, chemo done  . Breast reconstruction  1990    left  . Mastectomy  10/08/06    right  . Mastectomy Right 2008    prophalactic  . Robotic assisted total hysterectomy with bilateral salpingo oopherectomy Bilateral 03/21/2014    Procedure:  ROBOTIC ASSISTED TOTAL HYSTERECTOMY WITH BILATERAL SALPINGO OOPHORECTOMY ;  Surgeon: Janie Morning, MD;  Location: [facility];  Service: Gynecology;  Laterality: Bilateral;  . Abdominal hysterectomy  02/2014    There were no vitals filed for this visit.  Visit Diagnosis:  PFD (pelvic floor dysfunction)      Subjective Assessment - 10/04/14 1026    Subjective I see Dr. Skeet Latch and she said I was very dry. The lower abdominal pain is almost gone.  Every now and then I get a cramp.  Lower abdominal pain has decreased by 95% better. Patient reports the pain with dilator is just when she is getting the past the bulge.   Limitations Other (comment)   Patient Stated Goals Reduce pain, improve vaginal dryness, reduce urge to void, reduce fecal incontinence   Currently in Pain? Yes   Pain Score 7    Pain Location Vagina   Pain Orientation Mid   Pain Descriptors / Indicators Burning  feels raw   Pain Type Chronic pain   Pain Onset More than a month ago   Aggravating Factors  inserting dilator as it goes in    Pain Relieving Factors not using dilator   Multiple Pain Sites No  Pelvic Floor Special Questions - 10/04/14 0001    Pelvic Floor Internal Exam Patient confirms identication and approves physical therapist to assess pelvic floor strength and muscle integrity   Exam Type Vaginal   Strength fair squeeze, definite lift           [facility] Adult PT Treatment/Exercise - 10/04/14 0001    Manual Therapy   Manual Therapy Soft tissue mobilization;Myofascial release;Internal Pelvic Floor   Internal Pelvic Floor vaginal canal and internal by entrance to increase tissue pliability                PT Education - 10/04/14 1057    Education provided Yes   Education Details review about using moisturizers in vaginal area, performing soft tissue work to vaginal area, review on how to use dilator   Person(s) Educated Patient   Methods  Explanation;Demonstration;Tactile cues;Verbal cues;Handout   Comprehension Returned demonstration;Verbalized understanding          PT Short Term Goals - 10/04/14 1046    PT SHORT TERM GOAL #2   Title understand on how to perform self soft tissue work to perineal area to increase tissue mobility and use a dilator   Time 4   Period Weeks   Status Achieved   PT SHORT TERM GOAL #3   Title understand how to use lubricators and moisturizers to reduce dryness in the vaginal area   Time 4   Period Weeks   Status Achieved   PT SHORT TERM GOAL #4   Title understand how to use dilator correctly to reduce discomfort    Time 4   Period Weeks   Status Achieved           PT Long Term Goals - 10/04/14 1047    PT LONG TERM GOAL #1   Title vaginal dryness decreased >/= 60% to decrease the itching and irritation of the vaginal area   Time 8   Period Weeks   Status On-going   PT LONG TERM GOAL #2   Title pelvic floor strength  vaginally and anally 4/5   Time 8   Period Weeks   Status On-going  3   PT LONG TERM GOAL #3   Title fecal leakage decreased >/= 75% due to increase anal sphinter strength   Time 8   Period Weeks   Status On-going  50% better   PT LONG TERM GOAL #4   Title when patient has the urge she is able to wait 5-10  minutes before she has to urinate   Time 8   Period Weeks   Status On-going  no change   PT LONG TERM GOAL #5   Title lower abdominal discomfort during daily activities decreased >/= 75% due to increased tissue mobiity of lower abdominal area   Time 8   Period Weeks   Status Achieved               Plan - 10/04/14 1058    Clinical Impression Statement Patient has not been using moisturizer to help with her dryness.  Patient continues to have pain 7/10 while using dilator therfore want her to use a smaller one or small plastic tampon with relaxation exercises. Patient verbally understands.  Patient fecal leakage improved by 50%.  No changes  with the urge to urinate.  Patient abdominal cramping decreased by 95%.  Patient benefits from continuing therapy to work on increasing the vaginal canal , working on fecal leakage and urge to urinate.  Pt will benefit from skilled therapeutic intervention in order to improve on the following deficits Decreased strength;Decreased mobility;Pain;Increased muscle spasms;Decreased endurance   Rehab Potential Good   Clinical Impairments Affecting Rehab Potential None   PT Frequency 2x / week   PT Duration 8 weeks   PT Treatment/Interventions Biofeedback;Therapeutic activities;Therapeutic exercise;Neuromuscular re-education;Manual techniques;Patient/family education   PT Next Visit Plan soft tissue work, review of dilator; urge to urinate   PT Home Exercise Plan progress as needed   Consulted and Agree with Plan of Care Patient        Problem List Patient Active Problem List   Diagnosis Date Noted  . Genetic testing 05/23/2014  . History of breast cancer   . Family history of breast cancer   . Endometrial cancer 03/21/2014  . Uterine cancer 03/21/2014  . S/P mastectomy, bilateral 05/02/2013  . Situational stress 05/02/2013  . Osteoporosis, unspecified 05/02/2013  . Breast cancer, left breast 05/02/2013    Marrah Vanevery,PT 10/04/2014, 11:02 AM  [facility] [address], [place], [place], [address] Phone: [phone]   Fax:  [phone]

## 2014-10-06 ENCOUNTER — Encounter: Payer: Self-pay | Admitting: Physical Therapy

## 2014-10-06 ENCOUNTER — Ambulatory Visit: Payer: Medicare Other | Admitting: Physical Therapy

## 2014-10-06 DIAGNOSIS — N8184 Pelvic muscle wasting: Secondary | ICD-10-CM | POA: Diagnosis not present

## 2014-10-06 DIAGNOSIS — M6289 Other specified disorders of muscle: Secondary | ICD-10-CM

## 2014-10-06 NOTE — Therapy (Signed)
[facility] [address] [place], [place], [address] Phone: [phone]   Fax:  [phone]  Physical Therapy Treatment  Patient Details  Name: Beth Simon MRN: [id number] Date of Birth: 1941-02-04 Referring Provider:  Janie Morning, MD  Encounter Date: 10/06/2014      PT End of Session - 10/06/14 1024    Visit Number 6   Number of Visits 10  Medicare   Date for PT Re-Evaluation 11/09/14   PT Start Time 1025   PT Stop Time 1100   PT Time Calculation (min) 35 min   Activity Tolerance Patient tolerated treatment well   Behavior During Therapy [facility] for tasks assessed/performed      Past Medical History  Diagnosis Date  . Osteoporosis     on Evista  . Cystitis     cytoxen, had once or twice  . Vasovagal syncopes   . Macular degeneration   . Anemia   . Uterine fibroid   . Complication of anesthesia   . PONV (postoperative nausea and vomiting)   . Breast cancer 1990    bilateral mastectomy, adenoca breast-left MRM, reconstruction, chemo  . Bronchitis last 2 weeks    saw dr Felipa Eth 02-28-2014, he said no antibiotics needed, nonproductive cough  . Shoulder pain     taking physical therapy for last few weeks  . Neck pain     taking physictal therapy last 2 weeks  . History of breast cancer   . Uterine cancer 03/21/2014    MLH1/PMS2 LOH  . Family history of breast cancer   . History of radiation therapy 2/10, 2/12, 2/18, 2/24, 05/29/14    vaginal cuff/ 30 Gy/ 5 fx    Past Surgical History  Procedure Laterality Date  . Excision vaginal cyst    . Breast implant removal  6/94    left breast  . Cataract extraction Bilateral   . Radical mastectomy lnd  1990    left, chemo done  . Breast reconstruction  1990    left  . Mastectomy  10/08/06    right  . Mastectomy Right 2008    prophalactic  . Robotic assisted total hysterectomy with bilateral salpingo oopherectomy Bilateral 03/21/2014    Procedure:  ROBOTIC ASSISTED TOTAL HYSTERECTOMY WITH BILATERAL SALPINGO OOPHORECTOMY ;  Surgeon: Janie Morning, MD;  Location: [facility];  Service: Gynecology;  Laterality: Bilateral;  . Abdominal hysterectomy  02/2014    There were no vitals filed for this visit.  Visit Diagnosis:  PFD (pelvic floor dysfunction)      Subjective Assessment - 10/06/14 1024    Subjective I have been having cramping in the lower abdominal area. Just started this morning   Limitations Other (comment)   Patient Stated Goals Reduce pain, improve vaginal dryness, reduce urge to void, reduce fecal incontinence   Currently in Pain? Yes   Pain Score 3    Pain Location Abdomen   Pain Orientation Lower   Pain Descriptors / Indicators Cramping   Pain Type Chronic pain   Pain Onset Today   Pain Frequency Constant   Aggravating Factors  Not sure what aggravates the cramping   Pain Relieving Factors not sure   Multiple Pain Sites No                         [facility] Adult PT Treatment/Exercise - 10/06/14 0001    Manual Therapy   Manual Therapy Soft tissue mobilization;Myofascial release;Internal Pelvic  Floor   Soft tissue mobilization abdominal, diaphragm, along the lower abdominal                PT Education - 10/06/14 1057    Education provided Yes   Education Details holding a bowel movement when having the urge, bowel health, using vitamin E for vaginal moisturizer    Person(s) Educated Patient   Methods Explanation;Demonstration;Tactile cues;Verbal cues;Handout   Comprehension Returned demonstration;Verbalized understanding          PT Short Term Goals - 10/04/14 1046    PT SHORT TERM GOAL #2   Title understand on how to perform self soft tissue work to perineal area to increase tissue mobility and use a dilator   Time 4   Period Weeks   Status Achieved   PT SHORT TERM GOAL #3   Title understand how to use lubricators and moisturizers to reduce dryness in the vaginal area   Time 4    Period Weeks   Status Achieved   PT SHORT TERM GOAL #4   Title understand how to use dilator correctly to reduce discomfort    Time 4   Period Weeks   Status Achieved           PT Long Term Goals - 10/04/14 1047    PT LONG TERM GOAL #1   Title vaginal dryness decreased >/= 60% to decrease the itching and irritation of the vaginal area   Time 8   Period Weeks   Status On-going   PT LONG TERM GOAL #2   Title pelvic floor strength  vaginally and anally 4/5   Time 8   Period Weeks   Status On-going  3   PT LONG TERM GOAL #3   Title fecal leakage decreased >/= 75% due to increase anal sphinter strength   Time 8   Period Weeks   Status On-going  50% better   PT LONG TERM GOAL #4   Title when patient has the urge she is able to wait 5-10  minutes before she has to urinate   Time 8   Period Weeks   Status On-going  no change   PT LONG TERM GOAL #5   Title lower abdominal discomfort during daily activities decreased >/= 75% due to increased tissue mobiity of lower abdominal area   Time 8   Period Weeks   Status Achieved               Plan - 10/06/14 1059    Clinical Impression Statement Patient is having abdominal cramping and after soft tissue work the cramping has decreased.  Patient is still having difficulty with using moisturizer therefore it was better for her to try the vitamin E.  Patient was educated on the urge to hold back a bowel movement. Patient will be using a small tampon applicator  to see if easier than the dilator.     Pt will benefit from skilled therapeutic intervention in order to improve on the following deficits Decreased strength;Decreased mobility;Pain;Increased muscle spasms;Decreased endurance   Rehab Potential Good   Clinical Impairments Affecting Rehab Potential None   PT Frequency 2x / week   PT Duration 8 weeks   PT Treatment/Interventions Biofeedback;Therapeutic activities;Therapeutic exercise;Neuromuscular re-education;Manual  techniques;Patient/family education   PT Next Visit Plan soft tissue work,    PT Home Exercise Plan progress as needed   Consulted and Agree with Plan of Care Patient        Problem List Patient Active Problem  List   Diagnosis Date Noted  . Genetic testing 05/23/2014  . History of breast cancer   . Family history of breast cancer   . Endometrial cancer 03/21/2014  . Uterine cancer 03/21/2014  . S/P mastectomy, bilateral 05/02/2013  . Situational stress 05/02/2013  . Osteoporosis, unspecified 05/02/2013  . Breast cancer, left breast 05/02/2013    Keidan Aumiller,PT 10/06/2014, 11:05 AM  [facility] [address] [place], [place], [address] Phone: [phone]   Fax:  [phone]

## 2014-10-06 NOTE — Patient Instructions (Signed)
Bowel Control and Holding On When bowel control is decreased or lost it is helpful to understand some control techniques. Here are some tips for controlling your bowel movements.  1. Choose the best time of day to have a bowel movement:  Usually the best time of day for a bowel movement will be a half hour to an hour after breakfast.  For some people, a half hour to an hour after lunch will work better.  These times are best because the body uses the gastrocolic reflex, a stimulation of bowel motion that occurs with eating, to help produce a bowel movement.  2. Make sure that you are not rushed and have convenient access to a bathroom at your selected time.  3. Eat all your meals at a predictable time each day.  The bowel functions best when food is introduced at the same regular intervals.  4. The amount of food eaten at a given time of day should be about the same size from day to day.  The bowel functions best when food is introduced in similar quantities from day to day.  It is fine to have a small breakfast and a large lunch, or vice versa, just be consistent.  5. Eat two servings of fruit or vegetables and at least one serving of complex carbohydrates (whole grains such as brown rice, bran, whole wheat bread, or oatmeal) at each meal.   A serving of fruit or vegetables is a half-cup or medium-sized piece of fruit.  A serving of a complex carbohydrate is a half-cup or a slice of bread.  It is often desirable to eat more than the recommended minimum amounts of fruits, vegetables, and complex carbohydrates.  6. Drink plenty of water-ideally eight glasses a day.  7. Until regular bowel movements are established at a desired time of day, take 2-3 dried prunes (or  to 1/3 cup of prune juice) each night to stimulate morning bowel function.  8. Exercise daily.  You may exercise at any time of day, but you may find that bowel function is helped most if the exercise is at a consistent time each  day. 9.  Walking for 15-30 minutes per day.   Bowel Retraining Bowel Movement Control Techniques Unless you are experiencing incontinence, you should always listen to your body if you feel the need for a bowel movement.  If you are experiencing incontinence (you can't hold the feces in before you get to the toilet and/or need to rush to the toilet to make it on time) there are retraining techniques for resisting the urge and helping you gain control.  The retraining techniques work best when you are also doing regular exercises to strengthen the muscles of the sphincter and pelvic floor muscles.  Retraining techniques help your rectal and anal canal (where you store the stool), your pelvic floor muscles and your sphincter muscles learn to overcome the problem.  When you have some success holding on, you become more confident, experience less panic and therefore feel less urgency.  The less you panic, the easier it is to make the urge go away.  Resisting the Urge in Order to Gain Control With following steps you will become aware of your current ability to control your urge to have a bowel movement and to start practicing urge delay while on the toilet. . First, time your current ability to hold on to a bowel movement.  Take a watch to the bathroom and time your delay time.   Marland Kitchen  Next time you feel the urge to have a bowel movement, sit on the toilet and try to hold on for seconds longer or up to one minute before having a bowel movement.  Remember that you are sitting on the toilet and are therefore "safe" if you are not able to do this for the first few times.  . Once successful with your first hold-on time, increase the delay time up to 5 minutes.    Progression Techniques These steps are more challenging, but remember that you are sitting on the toilet and are therefore "safe." Gradually, you will find that you can increase the distance and time away from the toilet and the bathroom.  It will take  time to master, but the more you practice this program, along with pelvic floor and sphincter exercises, the sooner you are likely to succeed.   . Progress from 5 minutes to holding on for 10 minutes before allowing yourself to have a bowel movement.   . Next, begin moving away from the toilet. Sit near the toilet when you feel the need to have a bowel movement. Choose either the edge of the bathtub or a chair near the toilet.  Hold on for 5 minutes.  Once you are able to do this, repeat the exercise, increasing to 10 minutes. Finally, move outside the bathroom. Your muscles are becoming stronger and your awareness of the sensation changes is increasing. Your goal should be able to hold on for 10 minutes any distance away from the toilet  [facility] [address] [place], [place] [address] Phone # [phone] Fax [phone]

## 2014-10-09 ENCOUNTER — Ambulatory Visit: Payer: Medicare Other | Admitting: Physical Therapy

## 2014-10-09 ENCOUNTER — Encounter: Payer: Self-pay | Admitting: Physical Therapy

## 2014-10-09 DIAGNOSIS — M6289 Other specified disorders of muscle: Secondary | ICD-10-CM

## 2014-10-09 DIAGNOSIS — N8184 Pelvic muscle wasting: Secondary | ICD-10-CM | POA: Diagnosis not present

## 2014-10-09 NOTE — Therapy (Signed)
[facility] [address] [place], , [address] Phone: [phone]   Fax:  [phone]  Physical Therapy Treatment  Patient Details  Name: Beth Simon MRN: [id number] Date of Birth: 01/15/1941 Referring Provider:  Brewster, Wendy, MD  Encounter Date: 10/09/2014      PT End of Session - 10/09/14 1030    Visit Number 7   Number of Visits 10  Medicare   Date for PT Re-Evaluation 11/09/14   PT Start Time 1020   PT Stop Time 1100   PT Time Calculation (min) 40 min   Activity Tolerance Patient tolerated treatment well   Behavior During Therapy [facility] for tasks assessed/performed      Past Medical History  Diagnosis Date  . Osteoporosis     on Evista  . Cystitis     cytoxen, had once or twice  . Vasovagal syncopes   . Macular degeneration   . Anemia   . Uterine fibroid   . Complication of anesthesia   . PONV (postoperative nausea and vomiting)   . Breast cancer 1990    bilateral mastectomy, adenoca breast-left MRM, reconstruction, chemo  . Bronchitis last 2 weeks    saw dr stoneking 02-28-2014, he said no antibiotics needed, nonproductive cough  . Shoulder pain     taking physical therapy for last few weeks  . Neck pain     taking physictal therapy last 2 weeks  . History of breast cancer   . Uterine cancer 03/21/2014    MLH1/PMS2 LOH  . Family history of breast cancer   . History of radiation therapy 2/10, 2/12, 2/18, 2/24, 05/29/14    vaginal cuff/ 30 Gy/ 5 fx    Past Surgical History  Procedure Laterality Date  . Excision vaginal cyst    . Breast implant removal  6/94    left breast  . Cataract extraction Bilateral   . Radical mastectomy lnd  1990    left, chemo done  . Breast reconstruction  1990    left  . Mastectomy  10/08/06    right  . Mastectomy Right 2008    prophalactic  . Robotic assisted total hysterectomy with bilateral salpingo oopherectomy Bilateral 03/21/2014   Procedure: ROBOTIC ASSISTED TOTAL HYSTERECTOMY WITH BILATERAL SALPINGO OOPHORECTOMY ;  Surgeon: Wendy Brewster, MD;  Location: [facility];  Service: Gynecology;  Laterality: Bilateral;  . Abdominal hysterectomy  02/2014    There were no vitals filed for this visit.  Visit Diagnosis:  PFD (pelvic floor dysfunction)      Subjective Assessment - 10/09/14 1023    Subjective I have not had cramping over the weekend.  Patient reports she has been using the tampon and easier to insert and doing the stretching. I am getting the Vitamin E today.    Limitations Other (comment)   Patient Stated Goals Reduce pain, improve vaginal dryness, reduce urge to void, reduce fecal incontinence   Currently in Pain? Yes   Pain Score 3    Pain Location Vagina   Pain Orientation Mid   Pain Descriptors / Indicators Burning   Pain Type Chronic pain   Pain Onset In the past 7 days   Pain Frequency Intermittent   Aggravating Factors  when insert tampon   Pain Relieving Factors not sure   Multiple Pain Sites No                      Pelvic Floor Special Questions - 10/09/14   0001    Pelvic Floor Internal Exam Patient confirms identication and approves physical therapist to assess pelvic floor strength and muscle integrity   Exam Type Vaginal   Strength fair squeeze, definite lift           [facility] Adult PT Treatment/Exercise - 10/09/14 0001    Manual Therapy   Manual Therapy Soft tissue mobilization;Myofascial release;Internal Pelvic Floor   Internal Pelvic Floor vaginal canal and internal by entrance to increase tissue pliability                PT Education - 10/09/14 1054    Education provided No          PT Short Term Goals - 10/04/14 1046    PT SHORT TERM GOAL #2   Title understand on how to perform self soft tissue work to perineal area to increase tissue mobility and use a dilator   Time 4   Period Weeks   Status Achieved   PT SHORT TERM GOAL #3   Title understand how  to use lubricators and moisturizers to reduce dryness in the vaginal area   Time 4   Period Weeks   Status Achieved   PT SHORT TERM GOAL #4   Title understand how to use dilator correctly to reduce discomfort    Time 4   Period Weeks   Status Achieved           PT Long Term Goals - 10/09/14 1054    PT LONG TERM GOAL #1   Title vaginal dryness decreased >/= 60% to decrease the itching and irritation of the vaginal area   Time 8   Period Weeks   Status On-going  getting Vitamin E today   PT LONG TERM GOAL #2   Title pelvic floor strength  vaginally and anally 4/5   Time 8   Period Weeks   Status On-going  3/5   PT LONG TERM GOAL #3   Title fecal leakage decreased >/= 75% due to increase anal sphinter strength   Time 8   Period Weeks   Status On-going  50% better   PT LONG TERM GOAL #4   Title when patient has the urge she is able to wait 5-10  minutes before she has to urinate   Time 8   Period Weeks   Status On-going  no change   PT LONG TERM GOAL #5   Title lower abdominal discomfort during daily activities decreased >/= 75% due to increased tissue mobiity of lower abdominal area   Time 8   Period Weeks   Status Achieved               Plan - 10/09/14 1056    Clinical Impression Statement Patient has no met any new goals this week due to still working on muscle pliablity.  Patient is now using a tampon to dialte the area with pain no more than 3/10 and is ealsier to use than the dilator.  Patient is abl eto contract pelvic floor with circular contraction and lift. After soft tissue work there was increased soft tissue pliability >/= 50%.  Patient will  benefit from physical therapy to improve dilation of vaginal tissue.    Pt will benefit from skilled therapeutic intervention in order to improve on the following deficits Decreased strength;Decreased mobility;Pain;Increased muscle spasms;Decreased endurance   Rehab Potential Good   Clinical Impairments  Affecting Rehab Potential None   PT Frequency 2x / week   PT Duration   8 weeks   PT Treatment/Interventions Biofeedback;Therapeutic activities;Therapeutic exercise;Neuromuscular re-education;Manual techniques;Patient/family education   PT Next Visit Plan soft tissue work,    PT Home Exercise Plan progress as needed   Consulted and Agree with Plan of Care Patient        Problem List Patient Active Problem List   Diagnosis Date Noted  . Genetic testing 05/23/2014  . History of breast cancer   . Family history of breast cancer   . Endometrial cancer 03/21/2014  . Uterine cancer 03/21/2014  . S/P mastectomy, bilateral 05/02/2013  . Situational stress 05/02/2013  . Osteoporosis, unspecified 05/02/2013  . Breast cancer, left breast 05/02/2013    GRAY,CHERYL,PT 10/09/2014, 11:00 AM  [facility] [address], [address] [place], , [address] Phone: [phone]   Fax:  [phone]      

## 2014-10-11 ENCOUNTER — Encounter: Payer: Self-pay | Admitting: Physical Therapy

## 2014-10-11 ENCOUNTER — Ambulatory Visit: Payer: Medicare Other | Admitting: Physical Therapy

## 2014-10-11 DIAGNOSIS — M6289 Other specified disorders of muscle: Secondary | ICD-10-CM

## 2014-10-11 DIAGNOSIS — N8184 Pelvic muscle wasting: Secondary | ICD-10-CM | POA: Diagnosis not present

## 2014-10-11 NOTE — Therapy (Signed)
[facility] [address], [address] [place], [place], [address] Phone: [phone]   Fax:  [phone]  Physical Therapy Treatment  Patient Details  Name: Beth Simon MRN: [id number] Date of Birth: 1941-01-06 Referring Provider:  Laurette Schimke, MD  Encounter Date: 10/11/2014      PT End of Session - 10/11/14 1024    Visit Number 8   Number of Visits 10  Medicare   Date for PT Re-Evaluation 11/09/14   PT Start Time 1020   PT Stop Time 1100   PT Time Calculation (min) 40 min   Activity Tolerance Patient tolerated treatment well   Behavior During Therapy [facility] for tasks assessed/performed      Past Medical History  Diagnosis Date  . Osteoporosis     on Evista  . Cystitis     cytoxen, had once or twice  . Vasovagal syncopes   . Macular degeneration   . Anemia   . Uterine fibroid   . Complication of anesthesia   . PONV (postoperative nausea and vomiting)   . Breast cancer 1990    bilateral mastectomy, adenoca breast-left MRM, reconstruction, chemo  . Bronchitis last 2 weeks    saw dr Pete Glatter 02-28-2014, he said no antibiotics needed, nonproductive cough  . Shoulder pain     taking physical therapy for last few weeks  . Neck pain     taking physictal therapy last 2 weeks  . History of breast cancer   . Uterine cancer 03/21/2014    MLH1/PMS2 LOH  . Family history of breast cancer   . History of radiation therapy 2/10, 2/12, 2/18, 2/24, 05/29/14    vaginal cuff/ 30 Gy/ 5 fx    Past Surgical History  Procedure Laterality Date  . Excision vaginal cyst    . Breast implant removal  6/94    left breast  . Cataract extraction Bilateral   . Radical mastectomy lnd  1990    left, chemo done  . Breast reconstruction  1990    left  . Mastectomy  10/08/06    right  . Mastectomy Right 2008    prophalactic  . Robotic assisted total hysterectomy with bilateral salpingo oopherectomy Bilateral 03/21/2014     Procedure: ROBOTIC ASSISTED TOTAL HYSTERECTOMY WITH BILATERAL SALPINGO OOPHORECTOMY ;  Surgeon: Laurette Schimke, MD;  Location: [facility];  Service: Gynecology;  Laterality: Bilateral;  . Abdominal hysterectomy  02/2014    There were no vitals filed for this visit.  Visit Diagnosis:  PFD (pelvic floor dysfunction)      Subjective Assessment - 10/11/14 1025    Subjective I have used the vitamin E suppositories.  Had some difficulty getting it in.  When I took a shower I felt an oily residue this morning. No cramps today.    Limitations Other (comment)   Patient Stated Goals Reduce pain, improve vaginal dryness, reduce urge to void, reduce fecal incontinence   Currently in Pain? No/denies   Multiple Pain Sites No                         [facility] Adult PT Treatment/Exercise - 10/11/14 0001    Manual Therapy   Manual Therapy Soft tissue mobilization;Myofascial release   Soft tissue mobilization bilateral thighs   Myofascial Release bilateral groin areas                PT Education - 10/11/14 1029    Education provided Yes  Education Details educated patient on using the vitamin E suppositories daily, hip flexor stretch, hamstring stretch   Person(s) Educated Patient   Methods Explanation;Demonstration;Verbal cues   Comprehension Verbalized understanding          PT Short Term Goals - 10/04/14 1046    PT SHORT TERM GOAL #2   Title understand on how to perform self soft tissue work to perineal area to increase tissue mobility and use a dilator   Time 4   Period Weeks   Status Achieved   PT SHORT TERM GOAL #3   Title understand how to use lubricators and moisturizers to reduce dryness in the vaginal area   Time 4   Period Weeks   Status Achieved   PT SHORT TERM GOAL #4   Title understand how to use dilator correctly to reduce discomfort    Time 4   Period Weeks   Status Achieved           PT Long Term Goals - 10/09/14 1054    PT LONG TERM GOAL  #1   Title vaginal dryness decreased >/= 60% to decrease the itching and irritation of the vaginal area   Time 8   Period Weeks   Status On-going  getting Vitamin E today   PT LONG TERM GOAL #2   Title pelvic floor strength  vaginally and anally 4/5   Time 8   Period Weeks   Status On-going  3/5   PT LONG TERM GOAL #3   Title fecal leakage decreased >/= 75% due to increase anal sphinter strength   Time 8   Period Weeks   Status On-going  50% better   PT LONG TERM GOAL #4   Title when patient has the urge she is able to wait 5-10  minutes before she has to urinate   Time 8   Period Weeks   Status On-going  no change   PT LONG TERM GOAL #5   Title lower abdominal discomfort during daily activities decreased >/= 75% due to increased tissue mobiity of lower abdominal area   Time 8   Period Weeks   Status Achieved               Plan - 10/11/14 1054    Clinical Impression Statement Patient is a 73 year old female wth vaginal stenosis and pelvic pain Bilateral soft tissue and fascia of bil. quads, hip adductors, and hamstring which could contribute to pelvic floor tightness. Patient had soreness with the soft tissue technique but reduced after therapy. Patient continues ot benfit from physical therapy to reduce pelvic pain and improve vaginal stenosis.    Pt will benefit from skilled therapeutic intervention in order to improve on the following deficits Decreased strength;Decreased mobility;Pain;Increased muscle spasms;Decreased endurance   Rehab Potential Good   Clinical Impairments Affecting Rehab Potential None   PT Frequency 2x / week   PT Duration 8 weeks   PT Treatment/Interventions Biofeedback;Therapeutic activities;Therapeutic exercise;Neuromuscular re-education;Manual techniques;Patient/family education   PT Next Visit Plan soft tissue work,    PT Home Exercise Plan flexibility exercises   Consulted and Agree with Plan of Care Patient        Problem  List Patient Active Problem List   Diagnosis Date Noted  . Genetic testing 05/23/2014  . History of breast cancer   . Family history of breast cancer   . Endometrial cancer 03/21/2014  . Uterine cancer 03/21/2014  . S/P mastectomy, bilateral 05/02/2013  . Situational stress  05/02/2013  . Osteoporosis, unspecified 05/02/2013  . Breast cancer, left breast 05/02/2013    GRAY,CHERYL,PT 10/11/2014, 11:01 AM  [facility] [address] [place], [place], [address] Phone: [phone]   Fax:  [phone]

## 2014-10-11 NOTE — Patient Instructions (Signed)
Hip Flexor Stretch   Lying on back near edge of bed, bend one leg, foot flat. Hang other leg over edge, relaxed, thigh resting entirely on bed for _30 seconds. Repeat __2__ times. Do _1___ sessions per day. Advanced Exercise: Bend knee back keeping thigh in contact with bed.  http://gt2.exer.us/347   Copyright  VHI. All rights reserved.  Hamstring Stretch (Sitting)   Sitting, extend one leg and place hands on same thigh for support. Keeping torso straight, lean forward, sliding hands down leg, until a stretch is felt in back of thigh. Hold _30___ seconds. Repeat with other leg. 2 times each leg. 1 time per day.  Copyright  VHI. All rights reserved.  [place] [address], [place], [place] [address] Phone # [phone] Fax [phone]

## 2014-10-12 DIAGNOSIS — L603 Nail dystrophy: Secondary | ICD-10-CM | POA: Diagnosis not present

## 2014-10-12 DIAGNOSIS — Z79899 Other long term (current) drug therapy: Secondary | ICD-10-CM | POA: Diagnosis not present

## 2014-10-12 DIAGNOSIS — L680 Hirsutism: Secondary | ICD-10-CM | POA: Diagnosis not present

## 2014-10-12 DIAGNOSIS — L814 Other melanin hyperpigmentation: Secondary | ICD-10-CM | POA: Diagnosis not present

## 2014-10-24 ENCOUNTER — Ambulatory Visit: Payer: Medicare Other | Admitting: Physical Therapy

## 2014-10-26 DIAGNOSIS — I83892 Varicose veins of left lower extremities with other complications: Secondary | ICD-10-CM | POA: Diagnosis not present

## 2014-10-27 ENCOUNTER — Encounter: Payer: Self-pay | Admitting: Physical Therapy

## 2014-10-27 ENCOUNTER — Ambulatory Visit: Payer: Medicare Other | Admitting: Physical Therapy

## 2014-10-27 DIAGNOSIS — M6289 Other specified disorders of muscle: Secondary | ICD-10-CM

## 2014-10-27 DIAGNOSIS — N8184 Pelvic muscle wasting: Secondary | ICD-10-CM | POA: Diagnosis not present

## 2014-10-27 NOTE — Therapy (Signed)
[facility] [address], [place], [place], [address] Phone: [phone]   Fax:  [phone]  Physical Therapy Treatment  Patient Details  Name: Beth Simon MRN: [id number] Date of Birth: 1940-08-18 Referring Provider:  Janie Morning, MD  Encounter Date: 10/27/2014      PT End of Session - 10/27/14 1027    Visit Number 9   Number of Visits 10  Medicare   Date for PT Re-Evaluation 11/09/14   PT Start Time 1020   PT Stop Time 1100   PT Time Calculation (min) 40 min   Activity Tolerance Patient tolerated treatment well   Behavior During Therapy [facility] for tasks assessed/performed      Past Medical History  Diagnosis Date  . Osteoporosis     on Evista  . Cystitis     cytoxen, had once or twice  . Vasovagal syncopes   . Macular degeneration   . Anemia   . Uterine fibroid   . Complication of anesthesia   . PONV (postoperative nausea and vomiting)   . Breast cancer 1990    bilateral mastectomy, adenoca breast-left MRM, reconstruction, chemo  . Bronchitis last 2 weeks    saw dr Felipa Eth 02-28-2014, he said no antibiotics needed, nonproductive cough  . Shoulder pain     taking physical therapy for last few weeks  . Neck pain     taking physictal therapy last 2 weeks  . History of breast cancer   . Uterine cancer 03/21/2014    MLH1/PMS2 LOH  . Family history of breast cancer   . History of radiation therapy 2/10, 2/12, 2/18, 2/24, 05/29/14    vaginal cuff/ 30 Gy/ 5 fx    Past Surgical History  Procedure Laterality Date  . Excision vaginal cyst    . Breast implant removal  6/94    left breast  . Cataract extraction Bilateral   . Radical mastectomy lnd  1990    left, chemo done  . Breast reconstruction  1990    left  . Mastectomy  10/08/06    right  . Mastectomy Right 2008    prophalactic  . Robotic assisted total hysterectomy with bilateral salpingo oopherectomy Bilateral 03/21/2014   Procedure: ROBOTIC ASSISTED TOTAL HYSTERECTOMY WITH BILATERAL SALPINGO OOPHORECTOMY ;  Surgeon: Janie Morning, MD;  Location: [facility];  Service: Gynecology;  Laterality: Bilateral;  . Abdominal hysterectomy  02/2014    There were no vitals filed for this visit.  Visit Diagnosis:  PFD (pelvic floor dysfunction)      Subjective Assessment - 10/27/14 1027    Subjective I noticed in my sleep I wet myself.  I am noticing some difference. I am still using the tampon.  I have not started with the dilator   Patient Stated Goals Reduce pain, improve vaginal dryness, reduce urge to void, reduce fecal incontinence   Currently in Pain? No/denies                      Pelvic Floor Special Questions - 10/27/14 0001    Pelvic Floor Internal Exam Patient confirms identication and approves physical therapist to assess pelvic floor strength and muscle integrity   Exam Type Vaginal   Palpation tenderness in righ tiliococcygeus, thickening on left posterior vaginal area   Strength fair squeeze, definite lift           [facility] Adult PT Treatment/Exercise - 10/27/14 0001    Exercises   Exercises --  pelvic floor contraction with tactile cues   Manual Therapy   Manual Therapy Internal Pelvic Floor   Myofascial Release to bil. sides of urethra, posterior vaginal wall   Internal Pelvic Floor posterior vaginal wall, able to use two index fingers to open uo the introitus, bil. obturator internist                PT Education - 10/27/14 1058    Education provided Yes   Education Details progress to dilator   Person(s) Educated Patient   Methods Explanation;Verbal cues   Comprehension Verbalized understanding          PT Short Term Goals - 10/04/14 1046    PT SHORT TERM GOAL #2   Title understand on how to perform self soft tissue work to perineal area to increase tissue mobility and use a dilator   Time 4   Period Weeks   Status Achieved   PT SHORT TERM GOAL #3   Title  understand how to use lubricators and moisturizers to reduce dryness in the vaginal area   Time 4   Period Weeks   Status Achieved   PT SHORT TERM GOAL #4   Title understand how to use dilator correctly to reduce discomfort    Time 4   Period Weeks   Status Achieved           PT Long Term Goals - 10/27/14 1028    PT LONG TERM GOAL #1   Title vaginal dryness decreased >/= 60% to decrease the itching and irritation of the vaginal area   Time 8   Period Weeks   Status Achieved  60% better   PT LONG TERM GOAL #2   Title pelvic floor strength  vaginally and anally 4/5   Time 8   Period Weeks   Status On-going  3/5   PT LONG TERM GOAL #3   Title fecal leakage decreased >/= 75% due to increase anal sphinter strength   Time 8   Period Weeks   Status On-going   PT LONG TERM GOAL #4   Title when patient has the urge she is able to wait 5-10  minutes before she has to urinate   Time 8   Period Weeks   Status On-going   PT LONG TERM GOAL #5   Title lower abdominal discomfort during daily activities decreased >/= 75% due to increased tissue mobiity of lower abdominal area   Time 8   Period Weeks   Status Achieved               Plan - 10/27/14 1058    Clinical Impression Statement Patient reports the urge to have bowel has improved by 30%, fecal improved by 30%.  Patient is now ready for the dilator due ot increased mobility to tissuen in introitus. Increased mobility of internal pelvic floor muscles. Patient will continue to benefit physical therap to improve tissue mobility and increased pelvic floor strength to reduce leakage.    Pt will benefit from skilled therapeutic intervention in order to improve on the following deficits Decreased strength;Decreased mobility;Pain;Increased muscle spasms;Decreased endurance   Rehab Potential Good   Clinical Impairments Affecting Rehab Potential None   PT Frequency 2x / week   PT Duration 8 weeks   PT Next Visit Plan soft tissue  work, review stretches; see how it goes with dilator; Gcode needs to be done with progress note.    PT Home Exercise Plan progress as needed   Consulted  and Agree with Plan of Care Patient        Problem List Patient Active Problem List   Diagnosis Date Noted  . Genetic testing 05/23/2014  . History of breast cancer   . Family history of breast cancer   . Endometrial cancer 03/21/2014  . Uterine cancer 03/21/2014  . S/P mastectomy, bilateral 05/02/2013  . Situational stress 05/02/2013  . Osteoporosis, unspecified 05/02/2013  . Breast cancer, left breast 05/02/2013    GRAY,CHERYL,PT 10/27/2014, 11:03 AM  [facility] [address] [place], [place], [address] Phone: [phone]   Fax:  [phone]

## 2014-10-30 ENCOUNTER — Ambulatory Visit: Payer: Medicare Other | Admitting: Physical Therapy

## 2014-11-01 ENCOUNTER — Encounter: Payer: Self-pay | Admitting: Physical Therapy

## 2014-11-01 ENCOUNTER — Ambulatory Visit: Payer: Medicare Other | Attending: Gynecologic Oncology | Admitting: Physical Therapy

## 2014-11-01 DIAGNOSIS — N8184 Pelvic muscle wasting: Secondary | ICD-10-CM | POA: Diagnosis not present

## 2014-11-01 DIAGNOSIS — M6289 Other specified disorders of muscle: Secondary | ICD-10-CM

## 2014-11-01 NOTE — Therapy (Signed)
[facility] [address], [place], [place], [address] Phone: [phone]   Fax:  [phone]  Physical Therapy Treatment  Patient Details  Name: Beth Simon MRN: [id number] Date of Birth: 1940/05/17 Referring Provider:  Janie Morning, MD  Encounter Date: 11/01/2014      PT End of Session - 11/01/14 1045    Visit Number 10   Number of Visits 20  Medicare   Date for PT Re-Evaluation 12/21/14   PT Start Time 1324   PT Stop Time 1100   PT Time Calculation (min) 45 min   Activity Tolerance Patient tolerated treatment well   Behavior During Therapy [facility] for tasks assessed/performed      Past Medical History  Diagnosis Date  . Osteoporosis     on Evista  . Cystitis     cytoxen, had once or twice  . Vasovagal syncopes   . Macular degeneration   . Anemia   . Uterine fibroid   . Complication of anesthesia   . PONV (postoperative nausea and vomiting)   . Breast cancer 1990    bilateral mastectomy, adenoca breast-left MRM, reconstruction, chemo  . Bronchitis last 2 weeks    saw dr Felipa Eth 02-28-2014, he said no antibiotics needed, nonproductive cough  . Shoulder pain     taking physical therapy for last few weeks  . Neck pain     taking physictal therapy last 2 weeks  . History of breast cancer   . Uterine cancer 03/21/2014    MLH1/PMS2 LOH  . Family history of breast cancer   . History of radiation therapy 2/10, 2/12, 2/18, 2/24, 05/29/14    vaginal cuff/ 30 Gy/ 5 fx    Past Surgical History  Procedure Laterality Date  . Excision vaginal cyst    . Breast implant removal  6/94    left breast  . Cataract extraction Bilateral   . Radical mastectomy lnd  1990    left, chemo done  . Breast reconstruction  1990    left  . Mastectomy  10/08/06    right  . Mastectomy Right 2008    prophalactic  . Robotic assisted total hysterectomy with bilateral salpingo oopherectomy Bilateral 03/21/2014     Procedure: ROBOTIC ASSISTED TOTAL HYSTERECTOMY WITH BILATERAL SALPINGO OOPHORECTOMY ;  Surgeon: Janie Morning, MD;  Location: [facility];  Service: Gynecology;  Laterality: Bilateral;  . Abdominal hysterectomy  02/2014    There were no vitals filed for this visit.  Visit Diagnosis:  PFD (pelvic floor dysfunction)      Subjective Assessment - 11/01/14 1026    Subjective I am unable to use the dilator but I acn use the next size tampon. Vaginal dryness since initiat eval improved by 60%.    Limitations Other (comment)   Patient Stated Goals Reduce pain, improve vaginal dryness, reduce urge to void, reduce fecal incontinence   Currently in Pain? No/denies            [facility] PT Assessment - 11/01/14 0001    Assessment   Medical Diagnosis C54.1 Endometreal cancer   Onset Date/Surgical Date 05/01/14   Prior Therapy None   Precautions   Precautions Other (comment)  No ultrasound or heat   Balance Screen   Has the patient fallen in the past 6 months No   Has the patient had a decrease in activity level because of a fear of falling?  No   Is the patient reluctant to leave their home because  of a fear of falling?  No   Prior Function   Level of Independence Independent   Observation/Other Assessments   Focus on Therapeutic Outcomes (FOTO)  41% limitation                  Pelvic Floor Special Questions - 11/22/14 0001    Pelvic Floor Internal Exam Patient confirms identication and approves physical therapist to assess pelvic floor strength and muscle integrity   Exam Type Vaginal   Strength fair squeeze, definite lift           [facility] Adult PT Treatment/Exercise - 22-Nov-2014 0001    Lumbar Exercises: Stretches   Active Hamstring Stretch 2 reps;30 seconds  bil.    Passive Hamstring Stretch 2 reps;30 seconds  butterfly stretch with pillow under feel in supine   Quad Stretch 30 seconds;2 reps  bil.    Manual Therapy   Manual Therapy Internal Pelvic Floor   Myofascial  Release to bil. sides of urethra, posterior vaginal wall   Internal Pelvic Floor posterior vaginal wall, able to use two index fingers to open uo the introitus, bil. obturator internist                  PT Short Term Goals - 10/04/14 1046    PT SHORT TERM GOAL #2   Title understand on how to perform self soft tissue work to perineal area to increase tissue mobility and use a dilator   Time 4   Period Weeks   Status Achieved   PT SHORT TERM GOAL #3   Title understand how to use lubricators and moisturizers to reduce dryness in the vaginal area   Time 4   Period Weeks   Status Achieved   PT SHORT TERM GOAL #4   Title understand how to use dilator correctly to reduce discomfort    Time 4   Period Weeks   Status Achieved           PT Long Term Goals - 11-22-2014 1023    PT LONG TERM GOAL #1   Status --  60%   PT LONG TERM GOAL #2   Title pelvic floor strength  vaginally and anally 4/5   Time 8   Period Weeks   Status On-going  3/5   PT LONG TERM GOAL #3   Title fecal leakage decreased >/= 75% due to increase anal sphinter strength   Time 8   Period Weeks   Status On-going  60% improvement   PT LONG TERM GOAL #4   Title when patient has the urge she is able to wait 5-10  minutes before she has to urinate   Time 8   Period Weeks   Status On-going  50% better   PT LONG TERM GOAL #5   Title lower abdominal discomfort during daily activities decreased >/= 75% due to increased tissue mobiity of lower abdominal area   Time 8   Period Weeks   Status Achieved                 G-Codes - 11-22-2014 1101    Functional Assessment Tool Used FOTO score is 41% limitation   Functional Limitation Other PT primary   Other PT Primary Current Status ([id number]) At least 40 percent but less than 60 percent impaired, limited or restricted      Problem List Patient Active Problem List   Diagnosis Date Noted  . Genetic testing 05/23/2014  . History of breast cancer    .  Family history of breast cancer   . Endometrial cancer 03/21/2014  . Uterine cancer 03/21/2014  . S/P mastectomy, bilateral 05/02/2013  . Situational stress 05/02/2013  . Osteoporosis, unspecified 05/02/2013  . Breast cancer, left breast 05/02/2013    Raeven Pint,PT 11/01/2014, 11:03 AM  [facility] [address] [place], [place], [address] Phone: [phone]   Fax:  [phone]  Physical Therapy Progress Note  Dates of Reporting Period: 09/14/2014 to 8/3/016  Objective Reports of Subjective Statement: . Patient reports fecal leakage is 60% improvement. Vaginal dryness improved by 60%. Patient is able to use a small tampon but not ready for the small plus size dilator. Patient is able to hold the urge to urinate but when she gets to the toilet she has difficulty getting her pants down in time but has improved by 50%.   Objective Measurements: Pelvic floor strenth is 3/5. FOTO score limitation is 41%.   Goal Update:  Patient has met LTG #1 and  5.  Patient has not met LTG # 2,3,4.  Fecal leakage improved by 50%,and urge to urinate by 50% and pelvic floor strength is 3/5.  Plan: 1x/wk for 6 weeks Reason Skilled Services are Required: Patient will benefit from physical therapy to educate on using bigger dilators, internal soft tissue work and increasing pelvic floor strength.   Earlie Counts, PT 11/01/2014 11:03 AM

## 2014-11-06 ENCOUNTER — Ambulatory Visit: Payer: Medicare Other | Admitting: Physical Therapy

## 2014-11-06 ENCOUNTER — Encounter: Payer: Self-pay | Admitting: Physical Therapy

## 2014-11-06 DIAGNOSIS — N8184 Pelvic muscle wasting: Secondary | ICD-10-CM | POA: Diagnosis not present

## 2014-11-06 DIAGNOSIS — M6289 Other specified disorders of muscle: Secondary | ICD-10-CM

## 2014-11-06 NOTE — Therapy (Signed)
[facility] [address], [place], [place], [address] Phone: [phone]   Fax:  [phone]  Physical Therapy Treatment  Patient Details  Name: Beth Simon MRN: [id number] Date of Birth: [birth year] Referring Provider:  Janie Morning, MD  Encounter Date: 11/06/2014      PT End of Session - 11/06/14 1404    Visit Number 11   Number of Visits 20  Medicare   Date for PT Re-Evaluation 12/21/14   PT Start Time 1400   PT Stop Time 1440   PT Time Calculation (min) 40 min   Activity Tolerance Patient tolerated treatment well   Behavior During Therapy [facility] for tasks assessed/performed      Past Medical History  Diagnosis Date  . Osteoporosis     on Evista  . Cystitis     cytoxen, had once or twice  . Vasovagal syncopes   . Macular degeneration   . Anemia   . Uterine fibroid   . Complication of anesthesia   . PONV (postoperative nausea and vomiting)   . Breast cancer 1990    bilateral mastectomy, adenoca breast-left MRM, reconstruction, chemo  . Bronchitis last 2 weeks    saw dr Felipa Eth 02-28-2014, he said no antibiotics needed, nonproductive cough  . Shoulder pain     taking physical therapy for last few weeks  . Neck pain     taking physictal therapy last 2 weeks  . History of breast cancer   . Uterine cancer 03/21/2014    MLH1/PMS2 LOH  . Family history of breast cancer   . History of radiation therapy 2/10, 2/12, 2/18, 2/24, 05/29/14    vaginal cuff/ 30 Gy/ 5 fx    Past Surgical History  Procedure Laterality Date  . Excision vaginal cyst    . Breast implant removal  6/94    left breast  . Cataract extraction Bilateral   . Radical mastectomy lnd  1990    left, chemo done  . Breast reconstruction  1990    left  . Mastectomy  10/08/06    right  . Mastectomy Right 2008    prophalactic  . Robotic assisted total hysterectomy with bilateral salpingo oopherectomy Bilateral 03/21/2014   Procedure: ROBOTIC ASSISTED TOTAL HYSTERECTOMY WITH BILATERAL SALPINGO OOPHORECTOMY ;  Surgeon: Janie Morning, MD;  Location: [facility];  Service: Gynecology;  Laterality: Bilateral;  . Abdominal hysterectomy  02/2014    There were no vitals filed for this visit.  Visit Diagnosis:  PFD (pelvic floor dysfunction)      Subjective Assessment - 11/06/14 1404    Subjective I have not used the next size tampon.  I have tried the larger dilator but had some soreness. I had less soreness than when I initialy started with.    Limitations Other (comment)   Patient Stated Goals Reduce pain, improve vaginal dryness, reduce urge to void, reduce fecal incontinence   Currently in Pain? No/denies                      Pelvic Floor Special Questions - 11/06/14 0001    Pelvic Floor Internal Exam Patient confirms identication and approves physical therapist to assess pelvic floor strength and muscle integrity   Exam Type Vaginal   Strength fair squeeze, definite lift           [facility] Adult PT Treatment/Exercise - 11/06/14 0001    Lumbar Exercises: Machines for Strengthening   Other Lumbar Machine Exercise Nustep  level 2 x 6 min.    Manual Therapy   Manual Therapy Internal Pelvic Floor   Soft tissue mobilization abdominal soft tissue work especially on the right abdominal wall   Myofascial Release to bil. sides of urethra, posterior vaginal wall   Internal Pelvic Floor posterior vaginal wall, able to use two index fingers to open uo the introitus, bil. obturator internist                PT Education - 11/06/14 1406    Education provided Yes   Education Details instructed patient to use the dilator but not move it and let stay in canel for 5 -10 min; reviewed stretching exercises. Terence Lux) Educated Patient   Methods Explanation;Verbal cues   Comprehension Verbalized understanding          PT Short Term Goals - 10/04/14 1046    PT SHORT TERM GOAL #2   Title  understand on how to perform self soft tissue work to perineal area to increase tissue mobility and use a dilator   Time 4   Period Weeks   Status Achieved   PT SHORT TERM GOAL #3   Title understand how to use lubricators and moisturizers to reduce dryness in the vaginal area   Time 4   Period Weeks   Status Achieved   PT SHORT TERM GOAL #4   Title understand how to use dilator correctly to reduce discomfort    Time 4   Period Weeks   Status Achieved           PT Long Term Goals - 11/06/14 1408    PT LONG TERM GOAL #1   Title vaginal dryness decreased >/= 60% to decrease the itching and irritation of the vaginal area   Time 8   Period Weeks   Status Achieved  60% better   PT LONG TERM GOAL #2   Title pelvic floor strength  vaginally and anally 4/5   Time 8   Period Weeks   Status On-going  3/5   PT LONG TERM GOAL #3   Title fecal leakage decreased >/= 75% due to increase anal sphinter strength   Time 8   Period Weeks   Status On-going  has it 2-3 times per week instead of daily   PT LONG TERM GOAL #4   Title when patient has the urge she is able to wait 5-10  minutes before she has to urinate   Time 8   Period Weeks   Status Achieved   PT LONG TERM GOAL #5   Title lower abdominal discomfort during daily activities decreased >/= 75% due to increased tissue mobiity of lower abdominal area   Time 8   Period Weeks   Status Achieved               Plan - 11/06/14 1439    Clinical Impression Statement Patient is a 74 year old female with diagnoisis of Endometrial cancer.  Patient is using the dilator now intead of tampon but will not move it.  Patient has met LTG # 1,4,5.  Patient has fecal leakage 3-5x per week compared to 3 times daily.  Patient is doing the [employer] program at the [facility] to build up strength and endurance.  Patient would benefit from physical therapy to improve strength and reduce fecal incontinence and improve size of vaginal opening.    Pt  will benefit from skilled therapeutic intervention in order to improve on the  following deficits Decreased strength;Decreased mobility;Pain;Increased muscle spasms;Decreased endurance   Rehab Potential Good   Clinical Impairments Affecting Rehab Potential None   PT Frequency 2x / week   PT Duration 8 weeks   PT Treatment/Interventions Biofeedback;Therapeutic activities;Therapeutic exercise;Neuromuscular re-education;Manual techniques;Patient/family education   PT Next Visit Plan Pelvic floor emg for strengthening   PT Home Exercise Plan progress as needed   Consulted and Agree with Plan of Care Patient        Problem List Patient Active Problem List   Diagnosis Date Noted  . Genetic testing 05/23/2014  . History of breast cancer   . Family history of breast cancer   . Endometrial cancer 03/21/2014  . Uterine cancer 03/21/2014  . S/P mastectomy, bilateral 05/02/2013  . Situational stress 05/02/2013  . Osteoporosis, unspecified 05/02/2013  . Breast cancer, left breast 05/02/2013    GRAY,CHERYL,PT 11/06/2014, 2:44 PM  [facility] [address], [place], [place], [address] Phone: [phone]   Fax:  [phone]

## 2014-11-08 ENCOUNTER — Encounter: Payer: No Typology Code available for payment source | Admitting: Physical Therapy

## 2014-11-15 ENCOUNTER — Ambulatory Visit: Payer: Medicare Other | Admitting: Physical Therapy

## 2014-11-15 ENCOUNTER — Encounter: Payer: Self-pay | Admitting: Physical Therapy

## 2014-11-15 DIAGNOSIS — M6289 Other specified disorders of muscle: Secondary | ICD-10-CM

## 2014-11-15 DIAGNOSIS — N8184 Pelvic muscle wasting: Secondary | ICD-10-CM | POA: Diagnosis not present

## 2014-11-15 NOTE — Therapy (Signed)
[facility] [address] [place], [place], [address] Phone: [phone]   Fax:  [phone]  Physical Therapy Treatment  Patient Details  Name: Beth Simon MRN: [id number] Date of Birth: [birth year] Referring Provider:  Janie Morning, MD  Encounter Date: 11/15/2014      PT End of Session - 11/15/14 1106    Visit Number 12   Number of Visits 20  Medicare   Date for PT Re-Evaluation 12/21/14   PT Simon Time 1100   PT Stop Time 1140   PT Time Calculation (min) 40 min   Activity Tolerance Patient tolerated treatment well   Behavior During Therapy [facility] for tasks assessed/performed      Past Medical History  Diagnosis Date  . Osteoporosis     on Evista  . Cystitis     cytoxen, had once or twice  . Vasovagal syncopes   . Macular degeneration   . Anemia   . Uterine fibroid   . Complication of anesthesia   . PONV (postoperative nausea and vomiting)   . Breast cancer 1990    bilateral mastectomy, adenoca breast-left MRM, reconstruction, chemo  . Bronchitis last 2 weeks    saw dr Felipa Eth 02-28-2014, he said no antibiotics needed, nonproductive cough  . Shoulder pain     taking physical therapy for last few weeks  . Neck pain     taking physictal therapy last 2 weeks  . History of breast cancer   . Uterine cancer 03/21/2014    MLH1/PMS2 LOH  . Family history of breast cancer   . History of radiation therapy 2/10, 2/12, 2/18, 2/24, 05/29/14    vaginal cuff/ 30 Gy/ 5 fx    Past Surgical History  Procedure Laterality Date  . Excision vaginal cyst    . Breast implant removal  6/94    left breast  . Cataract extraction Bilateral   . Radical mastectomy lnd  1990    left, chemo done  . Breast reconstruction  1990    left  . Mastectomy  10/08/06    right  . Mastectomy Right 2008    prophalactic  . Robotic assisted total hysterectomy with bilateral salpingo oopherectomy Bilateral 03/21/2014   Procedure: ROBOTIC ASSISTED TOTAL HYSTERECTOMY WITH BILATERAL SALPINGO OOPHORECTOMY ;  Surgeon: Janie Morning, MD;  Location: [facility];  Service: Gynecology;  Laterality: Bilateral;  . Abdominal hysterectomy  02/2014    There were no vitals filed for this visit.  Visit Diagnosis:  PFD (pelvic floor dysfunction)      Subjective Assessment - 11/15/14 1111    Subjective I still have soreness going in. When insert the dilator it is a 5/10 then afterwards is 3/10. I am able to use the dilator that was given to me. Vitamin E is helping.    Limitations Other (comment)   Patient Stated Goals Reduce pain, improve vaginal dryness, reduce urge to void, reduce fecal incontinence   Currently in Pain? Yes   Pain Score 5    Pain Location Vagina   Pain Orientation Mid   Pain Descriptors / Indicators Burning   Pain Type Chronic pain   Pain Onset In the past 7 days   Pain Frequency Intermittent   Aggravating Factors  when insert dilator   Pain Relieving Factors not inserting dilator   Multiple Pain Sites No                      Pelvic Floor Special  Questions - 11/15/14 0001    Biofeedback sensor type Surface  vaginal   Biofeedback Activity 10 second hold;Quick contraction  relaxation     Pelvic floor assessment with surface electrodes in hookly: Resting level prior to exercise: 1.06uv 3 quick contractions: max 18.99 uv     Average 6.13 uv 10 second contraction: max  20.08 uv  Average 9.83 uv 20 second contraction: 7.15 uv Resting level after exercise: 1.71 uv  Contract 10 seconds and rest 10 seconds holding above 6.1 uv. 3 quick contractions with good contract and relax without delay.      [facility] Adult PT Treatment/Exercise - 11/15/14 0001    Lumbar Exercises: Machines for Strengthening   Other Lumbar Machine Exercise Nustep level 2 x 6 min.                   PT Short Term Goals - 10/04/14 1046    PT SHORT TERM GOAL #2   Title understand on how to perform self  soft tissue work to perineal area to increase tissue mobility and use a dilator   Time 4   Period Weeks   Status Achieved   PT SHORT TERM GOAL #3   Title understand how to use lubricators and moisturizers to reduce dryness in the vaginal area   Time 4   Period Weeks   Status Achieved   PT SHORT TERM GOAL #4   Title understand how to use dilator correctly to reduce discomfort    Time 4   Period Weeks   Status Achieved           PT Long Term Goals - 11/15/14 1117    PT LONG TERM GOAL #1   Title vaginal dryness decreased >/= 60% to decrease the itching and irritation of the vaginal area   Time 8   Period Weeks   Status Achieved   PT LONG TERM GOAL #2   Title pelvic floor strength  vaginally and anally 4/5   Time 8   Period Weeks   Status On-going   PT LONG TERM GOAL #3   Title fecal leakage decreased >/= 75% due to increase anal sphinter strength   Period Weeks   Status Achieved  1-2 times per week.    PT LONG TERM GOAL #4   Title when patient has the urge she is able to wait 5-10  minutes before she has to urinate   Time 8   Period Weeks   Status Achieved   PT LONG TERM GOAL #5   Title lower abdominal discomfort during daily activities decreased >/= 75% due to increased tissue mobiity of lower abdominal area   Time 8   Period Weeks   Status Achieved               Plan - 11/15/14 1135    Clinical Impression Statement Patient is a 74 year old female with diagnosis of endometrial cancer.  Patient continues to use dilator with intial pain 5/10 but afterwards is 2-3/10.  Pelvic floor EMG is resting level 1.06 uv, 3 quick contraction average is 6.13 uv, 10 second contraction is 9.83 uv, and 20 second contraction is 7.15 uv. Patient continues to go the [employer] Patient reports fecal leakage is decreased by 75% and leakes fecal 1-2 times per week. LTG #3 has been met.  Patient will benefit from physical therapy to direct in use of dilator and strength of  pelvic floor.    Pt will benefit from skilled  therapeutic intervention in order to improve on the following deficits Decreased strength;Decreased mobility;Pain;Increased muscle spasms;Decreased endurance   Rehab Potential Good   Clinical Impairments Affecting Rehab Potential None   PT Frequency 2x / week   PT Duration 8 weeks   PT Treatment/Interventions Biofeedback;Therapeutic activities;Therapeutic exercise;Neuromuscular re-education;Manual techniques;Patient/family education   PT Next Visit Plan soft tissue work   PT Home Exercise Plan progress as needed   Consulted and Agree with Plan of Care Patient        Problem List Patient Active Problem List   Diagnosis Date Noted  . Genetic testing 05/23/2014  . History of breast cancer   . Family history of breast cancer   . Endometrial cancer 03/21/2014  . Uterine cancer 03/21/2014  . S/P mastectomy, bilateral 05/02/2013  . Situational stress 05/02/2013  . Osteoporosis, unspecified 05/02/2013  . Breast cancer, left breast 05/02/2013    Ayleah Hofmeister,PT 11/15/2014, 11:40 AM  [facility] [address] [place], [place], [address] Phone: [phone]   Fax:  [phone]

## 2014-11-20 ENCOUNTER — Ambulatory Visit (INDEPENDENT_AMBULATORY_CARE_PROVIDER_SITE_OTHER): Payer: Medicare Other | Admitting: Podiatry

## 2014-11-20 ENCOUNTER — Encounter: Payer: Self-pay | Admitting: Podiatry

## 2014-11-20 VITALS — BP 157/84 | HR 72 | Resp 18

## 2014-11-20 DIAGNOSIS — M79673 Pain in unspecified foot: Secondary | ICD-10-CM

## 2014-11-20 DIAGNOSIS — M2040 Other hammer toe(s) (acquired), unspecified foot: Secondary | ICD-10-CM

## 2014-11-20 DIAGNOSIS — R52 Pain, unspecified: Secondary | ICD-10-CM

## 2014-11-20 DIAGNOSIS — B351 Tinea unguium: Secondary | ICD-10-CM

## 2014-11-20 DIAGNOSIS — L84 Corns and callosities: Secondary | ICD-10-CM

## 2014-11-20 NOTE — Progress Notes (Signed)
Patient ID: Beth Simon, female   DOB: 1941-01-27, 74 y.o.   MRN: [id number]  Subjective: 74 y.o. returns the office today for painful, elongated, thickened toenails which she is unable to trim herself. Denies any redness or drainage around the nails. Denies any acute changes since last appointment and no new complaints today. Denies any systemic complaints such as fevers, chills, nausea, vomiting.   Objective: AAO 3, NAD DP/PT pulses palpable, CRT less than 3 seconds Protective sensation intact with Simms Weinstein monofilament Nails hypertrophic, dystrophic, elongated, brittle, discolored 10. There is tenderness overlying the nails 1-5 bilaterally. There is no surrounding erythema or drainage along the nail sites. Hyperkeratotic lesions of the distal aspect left second and third digits. Upon debridement there is no underlying ulceration, drainage or other signs of infection. No open lesions or pre-ulcerative lesions are identified. No other areas of tenderness bilateral lower extremities. No overlying edema, erythema, increased warmth. Hammertoe contractures bilaerally Bilateral lower extremity edema around the ankles. No pain with calf compression, swelling, warmth, erythema.  Assessment: Patient presents with symptomatic onychomycosis; calluses   Plan: -Treatment options including alternatives, risks, complications were discussed -Nails sharply debrided 10 without complication/bleeding. -Hyperkeratotic lesion sharply debrided 2 without complication/bleeding. -Discussed likely etiology of the edema. She pieces intervening specials. Also recommended follow-up with her primary care physician. Continue compression stockings. -Discussed daily foot inspection. If there are any changes, to call the office immediately.  -Follow-up in 3 months or sooner if any problems are to arise. In the meantime, encouraged to call the office with any questions, concerns, changes symptoms.  Celesta Gentile, DPM

## 2014-11-22 ENCOUNTER — Encounter: Payer: No Typology Code available for payment source | Admitting: Physical Therapy

## 2014-11-24 DIAGNOSIS — M19042 Primary osteoarthritis, left hand: Secondary | ICD-10-CM | POA: Diagnosis not present

## 2014-11-24 DIAGNOSIS — M19041 Primary osteoarthritis, right hand: Secondary | ICD-10-CM | POA: Diagnosis not present

## 2014-11-27 DIAGNOSIS — I83892 Varicose veins of left lower extremities with other complications: Secondary | ICD-10-CM | POA: Diagnosis not present

## 2014-11-27 DIAGNOSIS — I8312 Varicose veins of left lower extremity with inflammation: Secondary | ICD-10-CM | POA: Diagnosis not present

## 2014-11-29 ENCOUNTER — Encounter: Payer: Self-pay | Admitting: Physical Therapy

## 2014-11-29 ENCOUNTER — Ambulatory Visit: Payer: Medicare Other | Admitting: Physical Therapy

## 2014-11-29 DIAGNOSIS — M6289 Other specified disorders of muscle: Secondary | ICD-10-CM

## 2014-11-29 DIAGNOSIS — N8184 Pelvic muscle wasting: Secondary | ICD-10-CM | POA: Diagnosis not present

## 2014-11-29 NOTE — Therapy (Signed)
[facility] [address] [place], [place], [address] Phone: [phone]   Fax:  [phone]  Physical Therapy Treatment  Patient Details  Name: Beth Simon MRN: [id number] Date of Birth: December 28, 1940 Referring Provider:  Janie Morning, MD  Encounter Date: 11/29/2014      PT End of Session - 11/29/14 1019    Visit Number 13   Number of Visits 20  Medicare   Date for PT Re-Evaluation 12/21/14   PT Start Time 0601   PT Stop Time 1100   PT Time Calculation (min) 45 min   Activity Tolerance Patient tolerated treatment well   Behavior During Therapy [facility] for tasks assessed/performed      Past Medical History  Diagnosis Date  . Osteoporosis     on Evista  . Cystitis     cytoxen, had once or twice  . Vasovagal syncopes   . Macular degeneration   . Anemia   . Uterine fibroid   . Complication of anesthesia   . PONV (postoperative nausea and vomiting)   . Breast cancer 1990    bilateral mastectomy, adenoca breast-left MRM, reconstruction, chemo  . Bronchitis last 2 weeks    saw dr Felipa Eth 02-28-2014, he said no antibiotics needed, nonproductive cough  . Shoulder pain     taking physical therapy for last few weeks  . Neck pain     taking physictal therapy last 2 weeks  . History of breast cancer   . Uterine cancer 03/21/2014    MLH1/PMS2 LOH  . Family history of breast cancer   . History of radiation therapy 2/10, 2/12, 2/18, 2/24, 05/29/14    vaginal cuff/ 30 Gy/ 5 fx    Past Surgical History  Procedure Laterality Date  . Excision vaginal cyst    . Breast implant removal  6/94    left breast  . Cataract extraction Bilateral   . Radical mastectomy lnd  1990    left, chemo done  . Breast reconstruction  1990    left  . Mastectomy  10/08/06    right  . Mastectomy Right 2008    prophalactic  . Robotic assisted total hysterectomy with bilateral salpingo oopherectomy Bilateral 03/21/2014   Procedure: ROBOTIC ASSISTED TOTAL HYSTERECTOMY WITH BILATERAL SALPINGO OOPHORECTOMY ;  Surgeon: Janie Morning, MD;  Location: [facility];  Service: Gynecology;  Laterality: Bilateral;  . Abdominal hysterectomy  02/2014    There were no vitals filed for this visit.  Visit Diagnosis:  PFD (pelvic floor dysfunction)      Subjective Assessment - 11/29/14 1020    Subjective I had a good weekend. Still sore while putting in the dilator but once it is in there is no pain and taking it out is no pain. I am using the main dilator. I am having better strength so I do not have urinary.  Fecal leakge 1 time per week with  needing ot wipe small amout.    Limitations Other (comment)   Patient Stated Goals Reduce pain, improve vaginal dryness, reduce urge to void, reduce fecal incontinence   Currently in Pain? Yes   Pain Score 5    Pain Location Vagina   Pain Orientation Mid   Pain Descriptors / Indicators Burning   Pain Type Chronic pain   Pain Onset In the past 7 days   Pain Frequency Intermittent   Aggravating Factors  when insert dilator   Pain Relieving Factors not inserting dilator   Multiple Pain Sites  No                      Pelvic Floor Special Questions - 11/29/14 0001    Pelvic Floor Internal Exam Patient confirms identication and approves physical therapist to assess pelvic floor strength and muscle integrity   Exam Type Vaginal   Strength fair squeeze, definite lift           [facility] Adult PT Treatment/Exercise - 11/29/14 0001    Manual Therapy   Manual Therapy Internal Pelvic Floor   Myofascial Release to bil. sides of urethra, posterior vaginal wall   Internal Pelvic Floor posterior vaginal wall, able to use two index fingers to open uo the introitus, bil. obturator internist                PT Education - 11/29/14 1051    Education provided Yes   Education Details use smaller tampon then dilator to ease initial pain   Person(s) Educated Patient    Methods Explanation;Verbal cues   Comprehension Verbalized understanding          PT Short Term Goals - 10/04/14 1046    PT SHORT TERM GOAL #2   Title understand on how to perform self soft tissue work to perineal area to increase tissue mobility and use a dilator   Time 4   Period Weeks   Status Achieved   PT SHORT TERM GOAL #3   Title understand how to use lubricators and moisturizers to reduce dryness in the vaginal area   Time 4   Period Weeks   Status Achieved   PT SHORT TERM GOAL #4   Title understand how to use dilator correctly to reduce discomfort    Time 4   Period Weeks   Status Achieved           PT Long Term Goals - 11/29/14 1023    PT LONG TERM GOAL #1   Title vaginal dryness decreased >/= 60% to decrease the itching and irritation of the vaginal area   Time 8   Period Weeks   Status Achieved   PT LONG TERM GOAL #2   Title pelvic floor strength  vaginally and anally 4/5   Time 8   Period Weeks   Status On-going   PT LONG TERM GOAL #3   Title fecal leakage decreased >/= 75% due to increase anal sphinter strength   Time 8   Period Weeks   Status Achieved   PT LONG TERM GOAL #4   Title when patient has the urge she is able to wait 5-10  minutes before she has to urinate   Period Weeks   Status Achieved   PT LONG TERM GOAL #5   Title lower abdominal discomfort during daily activities decreased >/= 75% due to increased tissue mobiity of lower abdominal area   Time 8   Period Weeks   Status Achieved               Plan - 11/29/14 1052    Clinical Impression Statement Patient is a 74 year old female with diagnosis of endomtrial cancer.  Patient continues to use dilator with iniial pain 5/10 but afterwards no pain. While performing soft tissue work to introitus much easier to place 2 fingers into canal with less pain.  Patient has not me t goals at this time.  Patient has no urinary leakage.  Patient has fecal leakage 1 time per week.    Pt will  benefit from skilled therapeutic intervention in order to improve on the following deficits Decreased strength;Decreased mobility;Pain;Increased muscle spasms;Decreased endurance   Rehab Potential Good   Clinical Impairments Affecting Rehab Potential None   PT Frequency 2x / week   PT Duration 8 weeks   PT Treatment/Interventions Biofeedback;Therapeutic activities;Therapeutic exercise;Neuromuscular re-education;Manual techniques;Patient/family education   PT Next Visit Plan soft tissue work; Possible D/C   PT Home Exercise Plan progress as needed   Consulted and Agree with Plan of Care Patient        Problem List Patient Active Problem List   Diagnosis Date Noted  . Genetic testing 05/23/2014  . History of breast cancer   . Family history of breast cancer   . Endometrial cancer 03/21/2014  . Uterine cancer 03/21/2014  . S/P mastectomy, bilateral 05/02/2013  . Situational stress 05/02/2013  . Osteoporosis, unspecified 05/02/2013  . Breast cancer, left breast 05/02/2013    GRAY,CHERYL,PT 11/29/2014, 10:57 AM  [facility] [address], [place], [place], [address] Phone: [phone]   Fax:  [phone]

## 2014-12-27 ENCOUNTER — Encounter: Payer: No Typology Code available for payment source | Admitting: Physical Therapy

## 2015-01-03 ENCOUNTER — Other Ambulatory Visit: Payer: Self-pay | Admitting: Nurse Practitioner

## 2015-01-03 NOTE — Telephone Encounter (Signed)
raloxifene (EVISTA) 60 MG tablet Patient request refills. Confirmed pharmacy with patient.

## 2015-01-03 NOTE — Telephone Encounter (Signed)
Medication refill request: Evista  Last AEX:  05/02/13 PG Next AEX: 04/10/15 PG Last MMG (if hormonal medication request): 02/2005 [id number]:Neg Refill authorized: 01/03/14 #[id number]/ 3R. Today please advise.   Routed to DL

## 2015-01-04 NOTE — Telephone Encounter (Signed)
Need to check with Patty on patient status with this

## 2015-01-05 ENCOUNTER — Telehealth: Payer: Self-pay | Admitting: *Deleted

## 2015-01-05 ENCOUNTER — Ambulatory Visit
Admission: RE | Admit: 2015-01-05 | Discharge: 2015-01-05 | Disposition: A | Discharge status: Home / Self Care | Payer: Medicare Other | Source: Ambulatory Visit | Attending: Radiation Oncology | Admitting: Radiation Oncology

## 2015-01-05 VITALS — BP 143/74 | HR 79 | Temp 97.7°F | Ht 60.0 in | Wt 134.9 lb

## 2015-01-05 DIAGNOSIS — C541 Malignant neoplasm of endometrium: Secondary | ICD-10-CM

## 2015-01-05 MED ORDER — RALOXIFENE HCL 60 MG PO TABS: 60.0000 mg | ORAL_TABLET | Freq: Every day | ORAL | Status: DC

## 2015-01-05 NOTE — Progress Notes (Signed)
Beth Simon is here for follow-up of brachytherapy/ HDR for endometrial cancer from 05/10/14 till 05/29/2014. She attends pelvic therapy with [facility] and she feels this is helping her. She reports improved urinary symptoms and no bowel problems at this time.  She does reports fatigue, but is able to complete her normal daily activities, however she states at times her fatigue holds her back from doing some activities she enjoys. She also reports napping all afternoon at times, and has some concerns about this.  BP 143/74 mmHg  Pulse 79  Temp(Src) 97.7 F (36.5 C)  Ht 5' (1.524 m)  Wt 134 lb 14.4 oz (61.19 kg)  BMI 26.35 kg/m2  LMP 03/31/1988  Wt Readings from Last 3 Encounters:  01/05/15 134 lb 14.4 oz (61.19 kg)  09/28/14 137 lb 6.4 oz (62.324 kg)  07/20/14 133 lb 14.4 oz (60.737 kg)

## 2015-01-05 NOTE — Progress Notes (Signed)
Radiation Oncology         [phone] ________________________________  Name: Beth Simon MRN: [id number]  Date: 01/05/2015  DOB: [birth year]  Follow-Up Visit Note  Outpatient  CC: Beth Argyle, MD  Beth Marus, MD  Diagnosis and Prior Radiotherapy:    ICD-9-CM ICD-10-CM   1. Endometrial cancer ([place]) 182.0 C54.1     FIGO Stage IA;  pT1a, pNX   Indication for treatment:  Post-op, curative    Radiation treatment dates:   05/10/2014, 05/12/2014, 05/18/2014, 05/24/2014, 05/29/2014  Site/dose:   Vaginal Cuff / 30 Gy in 5 fractions  Beams/energy:   Intracavitary cylinder brachytherapy / Iridium HDR  Narrative: Beth Simon is here for follow-up of brachytherapy/ HDR for endometrial cancer from 05/10/14 till 05/29/2014. She attends pelvic therapy with [facility] and she feels this is helping her. She reports improved urinary symptoms and no bowel problems at this time. She does report some fatigue.   She is continuing to visit physical therapy  Denies urinary incontinence or bleeding. Denies vaginal bleeding. Had an instance of watery, limited amount of fecal leakage while exercising on treadmill.  No recent changes in diet.   Wt Readings from Last 3 Encounters:  01/05/15 134 lb 14.4 oz (61.19 kg)  09/28/14 137 lb 6.4 oz (62.324 kg)  07/20/14 133 lb 14.4 oz (60.737 kg)        ALLERGIES:  is allergic to novocain.  Meds: Current Outpatient Prescriptions  Medication Sig Dispense Refill  . Coenzyme Q10 (COQ10 PO) Take 1 tablet by mouth every morning.     . Cyanocobalamin (VITAMIN B 12 PO) Take 1 tablet by mouth every morning.     . denosumab (PROLIA) 60 MG/ML SOLN injection Inject 60 mg into the skin every 6 (six) months. Administer in upper arm, thigh, or abdomen    . Flaxseed, Linseed, (FLAX SEEDS PO) Take 1 scoop by mouth every morning. Ground flaxseed in cereal.    . Glucosamine HCl (GLUCOSAMINE PO) Take 1 tablet by mouth every morning.       Marland Kitchen MELATONIN PO Take 1 tablet by mouth at bedtime as needed (sleep.).     Marland Kitchen Omega-3 Fatty Acids (FISH OIL PO) Take 1 capsule by mouth every morning.     Marland Kitchen OVER THE COUNTER MEDICATION Take 1 tablet by mouth every morning. Astazanthin. (anti-oxidant)    . VITAMIN D, CHOLECALCIFEROL, PO Take 4,000 Int'l Units by mouth every morning.     . raloxifene (EVISTA) 60 MG tablet Take 1 tablet (60 mg total) by mouth daily. 90 tablet 3   No current facility-administered medications for this encounter.    Physical Findings: The patient is in no acute distress. Patient is alert and oriented.  height is 5' (1.524 m) and weight is 134 lb 14.4 oz (61.19 kg). Her temperature is 97.7 F (36.5 C). Her blood pressure is 143/74 and her pulse is 79. Marland Kitchen    Lymph: No palpable inguinal adenopathy. GYN Exam: revealed normal external genitalia. No palpable or visible masses or lesions in the vaginal vault on digital and speculum exams. No fistulas appreciated on recto-vaginal exam Psych: affect appropriate, judgment intact   Lab Findings: Lab Results  Component Value Date   WBC 17.0* 03/22/2014   HGB 12.1 03/22/2014   HCT 37.3 03/22/2014   MCV 94.9 03/22/2014   PLT 186 03/22/2014     CMP     Component Value Date/Time   NA 136 03/22/2014 0412   K 4.1 03/22/2014  0412   CL 109 03/22/2014 0412   CO2 22 03/22/2014 0412   GLUCOSE 112* 03/22/2014 0412   BUN 9 03/22/2014 0412   CREATININE 0.63 03/22/2014 0412   CALCIUM 8.0* 03/22/2014 0412   PROT 7.6 03/01/2014 1040   ALBUMIN 3.7 03/01/2014 1040   AST 22 03/01/2014 1040   ALT 20 03/01/2014 1040   ALKPHOS 53 03/01/2014 1040   BILITOT <0.2* 03/01/2014 1040   GFRNONAA 86* 03/22/2014 0412   GFRAA >90 03/22/2014 0412      Radiographic Findings: No results found.  Impression/Plan:  Doing well with no evidence of disease. She will see her gynocologist in 3 months then Dr.Brewster in 6 months. I will order her to see me in 9 months. She is compliant with  using her dialator and attending physical therapy for pelvic rehabilitation. We gave her some smaller dialators as current size is causing discomfort, as well as more lubricant.  I spent 15 minutes face to face with the patient and more than 50% of that time was spent in counseling and/or coordination of care. _____________________________________   [name], MD  This document serves as a record of services personally performed by [name], MD. It was created on her behalf by Derek Mound, a trained medical scribe. The creation of this record is based on the scribe's personal observations and the provider's statements to them. This document has been checked and approved by the attending provider.

## 2015-01-05 NOTE — Telephone Encounter (Signed)
CALLED PATIENT TO INFORM OF FU ON 10-05-15 @ 11 AM, LVM FOR A RETURN CALL

## 2015-01-05 NOTE — Telephone Encounter (Signed)
Patty please check on

## 2015-01-10 ENCOUNTER — Encounter: Payer: Self-pay | Admitting: Physical Therapy

## 2015-01-10 ENCOUNTER — Ambulatory Visit: Payer: Medicare Other | Attending: Gynecologic Oncology | Admitting: Physical Therapy

## 2015-01-10 DIAGNOSIS — M6289 Other specified disorders of muscle: Secondary | ICD-10-CM

## 2015-01-10 DIAGNOSIS — N8184 Pelvic muscle wasting: Secondary | ICD-10-CM | POA: Insufficient documentation

## 2015-01-10 NOTE — Therapy (Signed)
[facility] [address], [place], [place], [address] Phone: [phone]   Fax:  [phone]  Physical Therapy Treatment  Patient Details  Name: Beth Simon MRN: [id number] Date of Birth: 07-23-1940 Referring Provider:  Janie Morning, MD  Encounter Date: 01/10/2015      PT End of Session - 01/10/15 1400    Visit Number 13   Number of Visits 20  Medicare   PT Start Time 1400   PT Stop Time 1445   PT Time Calculation (min) 45 min   Activity Tolerance Patient tolerated treatment well   Behavior During Therapy [facility] for tasks assessed/performed      Past Medical History  Diagnosis Date  . Osteoporosis     on Evista  . Cystitis     cytoxen, had once or twice  . Vasovagal syncopes   . Macular degeneration   . Anemia   . Uterine fibroid   . Complication of anesthesia   . PONV (postoperative nausea and vomiting)   . Breast cancer ([place]) 1990    bilateral mastectomy, adenoca breast-left MRM, reconstruction, chemo  . Bronchitis last 2 weeks    saw dr Felipa Eth 02-28-2014, he said no antibiotics needed, nonproductive cough  . Shoulder pain     taking physical therapy for last few weeks  . Neck pain     taking physictal therapy last 2 weeks  . History of breast cancer   . Uterine cancer ([place]) 03/21/2014    MLH1/PMS2 LOH  . Family history of breast cancer   . History of radiation therapy 2/10, 2/12, 2/18, 2/24, 05/29/14    vaginal cuff/ 30 Gy/ 5 fx    Past Surgical History  Procedure Laterality Date  . Excision vaginal cyst    . Breast implant removal  6/94    left breast  . Cataract extraction Bilateral   . Radical mastectomy lnd  1990    left, chemo done  . Breast reconstruction  1990    left  . Mastectomy  10/08/06    right  . Mastectomy Right 2008    prophalactic  . Robotic assisted total hysterectomy with bilateral salpingo oopherectomy Bilateral 03/21/2014    Procedure: ROBOTIC ASSISTED TOTAL  HYSTERECTOMY WITH BILATERAL SALPINGO OOPHORECTOMY ;  Surgeon: Janie Morning, MD;  Location: [facility];  Service: Gynecology;  Laterality: Bilateral;  . Abdominal hysterectomy  02/2014    There were no vitals filed for this visit.  Visit Diagnosis:  PFD (pelvic floor dysfunction)      Subjective Assessment - 01/10/15 1403    Subjective I saw Dr. Isidore Moos the radiation doctor.  I was sore with examination but not as sore as usual. They gave me smaller dilators to use. I was on the treadmill and felt wet.  I had a watery leakage 2 times on the treadmill.     Patient Stated Goals Reduce pain, improve vaginal dryness, reduce urge to void, reduce fecal incontinence   Currently in Pain? No/denies            [facility] PT Assessment - 01/10/15 0001    Assessment   Medical Diagnosis C54.1 Endometreal cancer   Onset Date/Surgical Date 05/01/14   Prior Therapy None   Precautions   Precautions Other (comment)  No ultrasound or heat   Balance Screen   Has the patient fallen in the past 6 months No   Has the patient had a decrease in activity level because of a fear of falling?  No   Is the patient reluctant to leave their home because of a fear of falling?  No   Prior Function   Level of Independence Independent   Observation/Other Assessments   Focus on Therapeutic Outcomes (FOTO)  39% limitation                  Pelvic Floor Special Questions - 01/10/15 0001    Pelvic Floor Internal Exam Patient confirms identication and approves physical therapist to assess pelvic floor strength and muscle integrity   Exam Type Vaginal   Palpation vaginal canal feels like it has doubled    Strength good squeeze, good lift, able to hold agaisnt strong resistance           [facility] Adult PT Treatment/Exercise - 01/10/15 0001    Self-Care   Self-Care Other Self-Care Comments   Other Self-Care Comments  reviewed how to use the other size dilators   Manual Therapy   Manual Therapy Soft tissue  mobilization;Internal Pelvic Floor   Soft tissue mobilization abdominal massage   Internal Pelvic Floor vaginal entrance, bil. obturator intermist                   PT Short Term Goals - 01/10/15 1442    PT SHORT TERM GOAL #1   Title understand how to perform abdominal massage to increased mobility of lower abdominal wall   Time 4   Period Weeks   Status Achieved   PT SHORT TERM GOAL #2   Title understand on how to perform self soft tissue work to perineal area to increase tissue mobility and use a dilator   Time 4   Period Weeks   Status Achieved   PT SHORT TERM GOAL #3   Title understand how to use lubricators and moisturizers to reduce dryness in the vaginal area   Time 4   Period Weeks   Status Achieved   PT SHORT TERM GOAL #4   Title understand how to use dilator correctly to reduce discomfort    Time 4   Period Weeks   Status Achieved           PT Long Term Goals - 01/10/15 1413    PT LONG TERM GOAL #1   Title vaginal dryness decreased >/= 60% to decrease the itching and irritation of the vaginal area   Time 8   Period Weeks   Status Achieved   PT LONG TERM GOAL #2   Title pelvic floor strength  vaginally and anally 4/5   Time 8   Period Weeks   Status Achieved   PT LONG TERM GOAL #3   Title fecal leakage decreased >/= 75% due to increase anal sphinter strength   Time 8   Period Weeks   Status Achieved   PT LONG TERM GOAL #4   Title when patient has the urge she is able to wait 5-10  minutes before she has to urinate   Time 8   Period Weeks   Status Achieved   PT LONG TERM GOAL #5   Title lower abdominal discomfort during daily activities decreased >/= 75% due to increased tissue mobiity of lower abdominal area   Time 8   Period Weeks   Status Achieved               Plan - 01/10/15 1444    Clinical Impression Statement Patient is a 74 year old female with diagnosis of endometrial cancer.  Patient has met all  of her goals.  Pelvic  floor strength is 4/5.  Vaginal canal has expanded to 2 and half fingers width with very little pain.  Patient will be using the other size dilators given to her by the doctor.  Patient has had 2 times of fecal leakage that is watery, one day on the treadmill .  The leakage may be from something intestinal instead of sphincter weakness. Patient is independent with her HEP.    Pt will benefit from skilled therapeutic intervention in order to improve on the following deficits Decreased strength;Decreased mobility;Pain;Increased muscle spasms;Decreased endurance   Rehab Potential Good   Clinical Impairments Affecting Rehab Potential None   PT Treatment/Interventions Biofeedback;Therapeutic activities;Therapeutic exercise;Neuromuscular re-education;Manual techniques;Patient/family education   PT Next Visit Plan dischage to HEP   PT Home Exercise Plan Current HEP   Consulted and Agree with Plan of Care Patient          G-Codes - 06-Feb-2015 1454    Functional Assessment Tool Used FOTO score is 39% limitation   Functional Limitation Other PT primary   Other PT Primary Goal Status (Z6109) At least 20 percent but less than 40 percent impaired, limited or restricted   Other PT Primary Discharge Status (U0454) At least 20 percent but less than 40 percent impaired, limited or restricted      Problem List Patient Active Problem List   Diagnosis Date Noted  . Genetic testing 05/23/2014  . History of breast cancer   . Family history of breast cancer   . Endometrial cancer ([place]) 03/21/2014  . Uterine cancer ([place]) 03/21/2014  . S/P mastectomy, bilateral 05/02/2013  . Situational stress 05/02/2013  . Osteoporosis, unspecified 05/02/2013  . Breast cancer, left breast ([place]) 05/02/2013    Viveka Wilmeth,PT February 06, 2015, 3:02 PM  [facility] [address] [place], [place], [address] Phone: [phone]   Fax:  [phone]  PHYSICAL THERAPY  DISCHARGE SUMMARY  Visits from Start of Care: 14  Current functional level related to goals / functional outcomes: See above.   Remaining deficits: Patient is continuing to use the dilators.  She is going to use the smallest and work up to the bigger one.    Education / Equipment: HEP Plan: Patient agrees to discharge.  Patient goals were met. Patient is being discharged due to meeting the stated rehab goals. Thank you for the referral.  ?????     Earlie Counts, PT 2015-02-06 3:02 PM

## 2015-01-19 DIAGNOSIS — Z23 Encounter for immunization: Secondary | ICD-10-CM | POA: Diagnosis not present

## 2015-02-26 DIAGNOSIS — R5381 Other malaise: Secondary | ICD-10-CM | POA: Diagnosis not present

## 2015-02-26 DIAGNOSIS — M810 Age-related osteoporosis without current pathological fracture: Secondary | ICD-10-CM | POA: Diagnosis not present

## 2015-02-26 DIAGNOSIS — E559 Vitamin D deficiency, unspecified: Secondary | ICD-10-CM | POA: Diagnosis not present

## 2015-02-28 DIAGNOSIS — M810 Age-related osteoporosis without current pathological fracture: Secondary | ICD-10-CM | POA: Diagnosis not present

## 2015-02-28 DIAGNOSIS — M1812 Unilateral primary osteoarthritis of first carpometacarpal joint, left hand: Secondary | ICD-10-CM | POA: Diagnosis not present

## 2015-02-28 DIAGNOSIS — M1811 Unilateral primary osteoarthritis of first carpometacarpal joint, right hand: Secondary | ICD-10-CM | POA: Diagnosis not present

## 2015-03-02 ENCOUNTER — Ambulatory Visit (INDEPENDENT_AMBULATORY_CARE_PROVIDER_SITE_OTHER): Payer: Medicare Other | Admitting: Podiatry

## 2015-03-02 DIAGNOSIS — R52 Pain, unspecified: Secondary | ICD-10-CM

## 2015-03-02 DIAGNOSIS — M79673 Pain in unspecified foot: Secondary | ICD-10-CM

## 2015-03-02 DIAGNOSIS — L84 Corns and callosities: Secondary | ICD-10-CM

## 2015-03-02 DIAGNOSIS — B351 Tinea unguium: Secondary | ICD-10-CM

## 2015-03-02 NOTE — Progress Notes (Signed)
Patient ID: Beth Simon, female   DOB: Aug 22, 1940, 74 y.o.   MRN: [id number]  Subjective: 74 y.o. returns the office today for painful, elongated, thickened toenails which she is unable to trim herself. Denies any redness or drainage around the nails. She also has a callus the end of her left second toe which is painful. Denies any swelling redness or drainage. Denies any acute changes since last appointment and no new complaints today. Denies any systemic complaints such as fevers, chills, nausea, vomiting.   Objective: AAO 3, NAD DP/PT pulses palpable, CRT less than 3 seconds Nails hypertrophic, dystrophic, elongated, brittle, discolored 10. There is tenderness overlying the nails 1-5 bilaterally. There is no surrounding erythema or drainage along the nail sites. Hyperkeratotic lesions of the distal aspect left second digit. Upon debridement there is no underlying ulceration, drainage or other signs of infection. No open lesions or pre-ulcerative lesions are identified. No other areas of tenderness bilateral lower extremities. No overlying edema, erythema, increased warmth. Hammertoe contractures bilaerally Bilateral lower extremity edema around the ankles. No pain with calf compression, swelling, warmth, erythema.  Assessment: Patient presents with symptomatic onychomycosis; calluses   Plan: -Treatment options including alternatives, risks, complications were discussed -Nails sharply debrided 10 without complication/bleeding. -Hyperkeratotic lesion sharply debrided 1 without complication/bleeding. -Discussed daily foot inspection. If there are any changes, to call the office immediately.  -Follow-up in 3 months or sooner if any problems are to arise. In the meantime, encouraged to call the office with any questions, concerns, changes symptoms.  Celesta Gentile, DPM

## 2015-04-10 ENCOUNTER — Ambulatory Visit: Payer: Medicare Other | Admitting: Nurse Practitioner

## 2015-04-12 DIAGNOSIS — I83812 Varicose veins of left lower extremities with pain: Secondary | ICD-10-CM | POA: Diagnosis not present

## 2015-04-12 DIAGNOSIS — I83892 Varicose veins of left lower extremities with other complications: Secondary | ICD-10-CM | POA: Diagnosis not present

## 2015-04-16 ENCOUNTER — Encounter: Payer: Self-pay | Admitting: Nurse Practitioner

## 2015-04-16 ENCOUNTER — Ambulatory Visit (INDEPENDENT_AMBULATORY_CARE_PROVIDER_SITE_OTHER): Payer: PPO | Admitting: Nurse Practitioner

## 2015-04-16 VITALS — BP 152/82 | HR 68 | Ht 59.25 in | Wt 136.0 lb

## 2015-04-16 DIAGNOSIS — Z1272 Encounter for screening for malignant neoplasm of vagina: Secondary | ICD-10-CM | POA: Diagnosis not present

## 2015-04-16 DIAGNOSIS — C541 Malignant neoplasm of endometrium: Secondary | ICD-10-CM

## 2015-04-16 DIAGNOSIS — Z9013 Acquired absence of bilateral breasts and nipples: Secondary | ICD-10-CM

## 2015-04-16 DIAGNOSIS — M810 Age-related osteoporosis without current pathological fracture: Secondary | ICD-10-CM

## 2015-04-16 DIAGNOSIS — Z01419 Encounter for gynecological examination (general) (routine) without abnormal findings: Secondary | ICD-10-CM

## 2015-04-16 NOTE — Patient Instructions (Signed)

## 2015-04-16 NOTE — Progress Notes (Signed)
Patient ID: Beth Simon, female   DOB: [birth year], 75 y.o.   MRN: [id number]  75 y.o. G1P0001 Widowed  Caucasian Fe here for annual exam. At last AEX she was found to have PMB and was seen by Surgical Oncology for Endometrial cancer.  She had surgery 03/21/14. FIGO II with microinvasion.  She has now finished radiation 05/2014.  She did develop vaginal stenosis from radiation and has been using vaginal dilators per pelvic PT.  She has found this has been very helpful.  She did have genetic testing for Lynch syndrome and it was negative.  Saw the Oncologist in October, for a pap today.  Patient's last menstrual period was 03/31/1988 (approximate).          Sexually active: No.  The current method of family planning is abstinence, status post hysterectomy and post menopausal status.    Exercising: Yes.    treadmill and walking Smoker:  no  Health Maintenance: Pap:  04/29/12, WNL MMG:  Bilateral mastectomy Colonoscopy:  2006, repeat in 10 years PCP will schedule BMD: 02/2012, -2.6/-2.2/-2.0, 2016 with Dr. Layne Benton; pt on Prolia 12/2014  and Evista TDaP:11/2007 per paper chart Shingles: 2008 Pneumonia: completed x 2, unsure date Hep C and HIV: Not indicated due to age Labs: PCP and [place]   reports that she has never smoked. She has never used smokeless tobacco. She reports that she does not drink alcohol or use illicit drugs.  Past Medical History  Diagnosis Date  . Osteoporosis     on Evista  . Cystitis     cytoxen, had once or twice  . Vasovagal syncopes   . Macular degeneration   . Anemia   . Uterine fibroid   . Complication of anesthesia   . PONV (postoperative nausea and vomiting)   . Breast cancer ([place]) 1990    bilateral mastectomy, adenoca breast-left MRM, reconstruction, chemo  . Bronchitis last 2 weeks    saw dr Felipa Eth 02-28-2014, he said no antibiotics needed, nonproductive cough  . Shoulder pain     taking physical therapy for last few weeks  . Neck pain      taking physictal therapy last 2 weeks  . History of breast cancer   . Uterine cancer ([place]) 03/21/2014    MLH1/PMS2 LOH  . Family history of breast cancer   . History of radiation therapy 2/10, 2/12, 2/18, 2/24, 05/29/14    vaginal cuff/ 30 Gy/ 5 fx    Past Surgical History  Procedure Laterality Date  . Excision vaginal cyst    . Breast implant removal  6/94    left breast  . Cataract extraction Bilateral   . Radical mastectomy lnd  1990    left, chemo done  . Breast reconstruction  1990    left  . Mastectomy Right 10/08/06    prophylactic  . Robotic assisted total hysterectomy with bilateral salpingo oopherectomy Bilateral 03/21/2014    Procedure: ROBOTIC ASSISTED TOTAL HYSTERECTOMY WITH BILATERAL SALPINGO OOPHORECTOMY ;  Surgeon: Janie Morning, MD;  Location: [facility];  Service: Gynecology;  Laterality: Bilateral;    Current Outpatient Prescriptions  Medication Sig Dispense Refill  . Coenzyme Q10 (COQ10 PO) Take 1 tablet by mouth every morning.     . Cyanocobalamin (VITAMIN B 12 PO) Take 1 tablet by mouth every morning.     . denosumab (PROLIA) 60 MG/ML SOLN injection Inject 60 mg into the skin every 6 (six) months. Administer in upper arm, thigh, or abdomen    .  Flaxseed, Linseed, (FLAX SEEDS PO) Take 1 scoop by mouth every morning. Ground flaxseed in cereal.    . Glucosamine HCl (GLUCOSAMINE PO) Take 1 tablet by mouth every morning.     Marland Kitchen MELATONIN PO Take 1 tablet by mouth at bedtime as needed (sleep.).     Marland Kitchen Omega-3 Fatty Acids (FISH OIL PO) Take 1 capsule by mouth every morning.     Marland Kitchen OVER THE COUNTER MEDICATION Take 1 tablet by mouth every morning. Astazanthin. (anti-oxidant)    . raloxifene (EVISTA) 60 MG tablet Take 1 tablet (60 mg total) by mouth daily. 90 tablet 3  . VITAMIN D, CHOLECALCIFEROL, PO Take 4,000 Int'l Units by mouth every morning.      No current facility-administered medications for this visit.    Family History  Problem Relation Age of Onset  . Breast  cancer Sister 38    recurrence age 38  . Breast cancer Paternal Grandmother   . Heart disease Mother   . Heart disease Father   . Hypertension Sister   . Dementia Sister   . Breast cancer Maternal Aunt 54  . Dementia Maternal Aunt     dx in her 23s    ROS:  Pertinent items are noted in HPI.  Otherwise, a comprehensive ROS was negative.  Exam:   BP 152/82 mmHg  Pulse 68  Ht 4' 11.25" (1.505 m)  Wt 136 lb (61.689 kg)  BMI 27.24 kg/m2  LMP 03/31/1988 (Approximate) Height: 4' 11.25" (150.5 cm) Ht Readings from Last 3 Encounters:  04/16/15 4' 11.25" (1.505 m)  01/05/15 5' (1.524 m)  09/28/14 5' (1.524 m)    General appearance: alert, cooperative and appears stated age Head: Normocephalic, without obvious abnormality, atraumatic Neck: no adenopathy, supple, symmetrical, trachea midline and thyroid normal to inspection and palpation Lungs: clear to auscultation bilaterally Breasts: bilateral mastectomies without new mass. Heart: regular rate and rhythm Abdomen: soft, non-tender; no masses,  no organomegaly Extremities: extremities normal, atraumatic, no cyanosis or edema Skin: Skin color, texture, turgor normal. No rashes or lesions Lymph nodes: Cervical, supraclavicular, and axillary nodes normal. No abnormal inguinal nodes palpated Neurologic: Grossly normal   Pelvic: External genitalia:  no lesions              Urethra:  normal appearing urethra with no masses, tenderness or lesions              Bartholin's and Skene's: normal                 Vagina: normal appearing vagina with normal color and discharge, no lesions              Cervix: absent              Pap taken: Yes.   Bimanual Exam:  Uterus:  uterus absent              Adnexa: no mass, fullness, tenderness               Rectovaginal: Confirms               Anus:  normal sphincter tone, no lesions  Chaperone present: yes  A:  Well Woman with normal exam  Postmenopausal S/P bilateral mastectomy  secondary to left breast cancer 06/1988 with implant removed 6/94, Right mastectomy 09/2006 prophylactically Osteoporosis treated by PCP with Prolia  S/P Endometrial cancer, FIGO II 03/21/2014  Vit D deficiency   P:   Reviewed health and wellness pertinent to exam  Pap smear as above  Will follow with PCP  Counseled on breast self exam, osteoporosis, adequate intake of calcium and vitamin D, diet and exercise, Kegel's exercises return annually or prn  An After Visit Summary was printed and given to the patient.  Will get immunizations from Dr. Felipa Eth   At [facility] she felt so Blessed to be apart of a service for her husband.  She had her son take her back in April as a Birthday present and she met the [employer] again and shared with him a poem she wrote about that day. She brings a copy to share with [place].  It is placed in her paper chart.

## 2015-04-18 LAB — IPS PAP SMEAR ONLY

## 2015-04-18 NOTE — Progress Notes (Signed)
Encounter reviewed by Dr. Kylah Maresh Amundson C. Silva.  

## 2015-04-19 DIAGNOSIS — R600 Localized edema: Secondary | ICD-10-CM | POA: Diagnosis not present

## 2015-04-20 ENCOUNTER — Telehealth: Payer: Self-pay | Admitting: Nurse Practitioner

## 2015-04-20 NOTE — Telephone Encounter (Signed)
Patient [facility] ref[facility] of [facility]zac [facility] [address].  [id number] [place], [place] [address] Phone: [phone] Fax: [phone]  Best # to reach: [phone]

## 2015-04-23 NOTE — Telephone Encounter (Signed)
Medication refill request: Prozac 60mg  ( I do not see this med on her current med list)  Last AEX:  04-20-15  Next AEX: 04-22-16 Last MMG (if hormonal medication request): N/A bilateral mastectomy  Refill authorized: please advise   Patient [facility]ac [facility] [address].  [id number] [place],  - [place] [address]  Phone: [phone] Fax: [phone]  Best # to reach: [phone]

## 2015-04-23 NOTE — Telephone Encounter (Signed)
Spoke with patient. Patient states that she does not take Prozac. States she needs a refill on her Evista 60 mg. Advised patient per our record Kem Boroughs, FNP sent in Evista 60 mg #90 3RF on 01/05/2015. Advised refills should remain on her current prescription. Patient is agreeable and will contact the pharmacy to have this refilled. Will contact the office if she needs any assistance.  Routing to provider for final review. Patient agreeable to disposition. Will close encounter.

## 2015-04-23 NOTE — Telephone Encounter (Signed)
She must be getting this from PCP - went back and looked on last years notes and med list and not given then.  Ask her who gave this and have her contact them.

## 2015-05-01 DIAGNOSIS — S40012A Contusion of left shoulder, initial encounter: Secondary | ICD-10-CM | POA: Diagnosis not present

## 2015-05-01 DIAGNOSIS — W19XXXA Unspecified fall, initial encounter: Secondary | ICD-10-CM | POA: Diagnosis not present

## 2015-05-08 ENCOUNTER — Other Ambulatory Visit: Payer: Self-pay | Admitting: Geriatric Medicine

## 2015-05-08 DIAGNOSIS — Z1389 Encounter for screening for other disorder: Secondary | ICD-10-CM | POA: Diagnosis not present

## 2015-05-08 DIAGNOSIS — I10 Essential (primary) hypertension: Secondary | ICD-10-CM | POA: Diagnosis not present

## 2015-05-08 DIAGNOSIS — Z79899 Other long term (current) drug therapy: Secondary | ICD-10-CM | POA: Diagnosis not present

## 2015-05-08 DIAGNOSIS — M25512 Pain in left shoulder: Secondary | ICD-10-CM | POA: Diagnosis not present

## 2015-05-08 DIAGNOSIS — E041 Nontoxic single thyroid nodule: Secondary | ICD-10-CM | POA: Diagnosis not present

## 2015-05-08 DIAGNOSIS — R911 Solitary pulmonary nodule: Secondary | ICD-10-CM | POA: Diagnosis not present

## 2015-05-08 DIAGNOSIS — K219 Gastro-esophageal reflux disease without esophagitis: Secondary | ICD-10-CM | POA: Diagnosis not present

## 2015-05-08 DIAGNOSIS — Z0000 Encounter for general adult medical examination without abnormal findings: Secondary | ICD-10-CM | POA: Diagnosis not present

## 2015-05-08 DIAGNOSIS — M810 Age-related osteoporosis without current pathological fracture: Secondary | ICD-10-CM | POA: Diagnosis not present

## 2015-05-11 ENCOUNTER — Ambulatory Visit
Admission: RE | Admit: 2015-05-11 | Discharge: 2015-05-11 | Disposition: A | Discharge status: Home / Self Care | Payer: PPO | Source: Ambulatory Visit | Attending: Geriatric Medicine | Admitting: Geriatric Medicine

## 2015-05-11 ENCOUNTER — Other Ambulatory Visit: Payer: Self-pay | Admitting: Geriatric Medicine

## 2015-05-11 DIAGNOSIS — E041 Nontoxic single thyroid nodule: Secondary | ICD-10-CM

## 2015-05-11 DIAGNOSIS — R918 Other nonspecific abnormal finding of lung field: Secondary | ICD-10-CM | POA: Diagnosis not present

## 2015-05-11 DIAGNOSIS — E049 Nontoxic goiter, unspecified: Secondary | ICD-10-CM | POA: Diagnosis not present

## 2015-05-11 DIAGNOSIS — R911 Solitary pulmonary nodule: Secondary | ICD-10-CM

## 2015-05-14 DIAGNOSIS — M25512 Pain in left shoulder: Secondary | ICD-10-CM | POA: Diagnosis not present

## 2015-05-15 ENCOUNTER — Ambulatory Visit: Payer: PPO | Attending: Orthopedic Surgery

## 2015-05-15 DIAGNOSIS — M25612 Stiffness of left shoulder, not elsewhere classified: Secondary | ICD-10-CM | POA: Diagnosis not present

## 2015-05-15 DIAGNOSIS — M25512 Pain in left shoulder: Secondary | ICD-10-CM | POA: Diagnosis not present

## 2015-05-15 DIAGNOSIS — R29898 Other symptoms and signs involving the musculoskeletal system: Secondary | ICD-10-CM

## 2015-05-15 NOTE — Therapy (Signed)
[facility] [address], [place], [place], [address] Phone: [phone]   Fax:  [phone]  Physical Therapy Evaluation  Patient Details  Name: Beth Simon MRN: [id number] Date of Birth: October 17, 1940 Referring Provider: Bevelyn Buckles, MD  Encounter Date: 05/15/2015      PT End of Session - 05/15/15 1528    Visit Number 1   Number of Visits 10   Date for PT Re-Evaluation 07/10/15   PT Start Time 4132   PT Stop Time 1528   PT Time Calculation (min) 41 min   Activity Tolerance Patient tolerated treatment well   Behavior During Therapy [facility] for tasks assessed/performed      Past Medical History  Diagnosis Date  . Osteoporosis     on Evista  . Cystitis     cytoxen, had once or twice  . Vasovagal syncopes   . Macular degeneration   . Anemia   . Uterine fibroid   . Complication of anesthesia   . PONV (postoperative nausea and vomiting)   . Breast cancer ([place]) 1990    bilateral mastectomy, adenoca breast-left MRM, reconstruction, chemo  . Bronchitis last 2 weeks    saw dr Felipa Eth 02-28-2014, he said no antibiotics needed, nonproductive cough  . Shoulder pain     taking physical therapy for last few weeks  . Neck pain     taking physictal therapy last 2 weeks  . History of breast cancer   . Uterine cancer ([place]) 03/21/2014    MLH1/PMS2 LOH  . Family history of breast cancer   . History of radiation therapy 2/10, 2/12, 2/18, 2/24, 05/29/14    vaginal cuff/ 30 Gy/ 5 fx    Past Surgical History  Procedure Laterality Date  . Excision vaginal cyst    . Breast implant removal  6/94    left breast  . Cataract extraction Bilateral   . Radical mastectomy lnd  1990    left, chemo done  . Breast reconstruction  1990    left  . Mastectomy Right 10/08/06    prophylactic  . Robotic assisted total hysterectomy with bilateral salpingo oopherectomy Bilateral 03/21/2014    Procedure: ROBOTIC ASSISTED TOTAL  HYSTERECTOMY WITH BILATERAL SALPINGO OOPHORECTOMY ;  Surgeon: Janie Morning, MD;  Location: [facility];  Service: Gynecology;  Laterality: Bilateral;    There were no vitals filed for this visit.  Visit Diagnosis:  Pain in joint of left shoulder - Plan: PT plan of care cert/re-cert  Shoulder weakness - Plan: PT plan of care cert/re-cert  Shoulder stiffness, left - Plan: PT plan of care cert/re-cert      Subjective Assessment - 05/15/15 1454    Subjective Pt is a Rt hand dominant female who presents to PT s/p fall (1/131/17) with Lt shoulder pain.  Pt reports that she was stepping up a small step and tripped while holding items with both hands.  Pt landed on her Lt shoulder.  X-rays were performed were negative for fracture.  Pt is wearing a sling per MDs orders.     Diagnostic tests x-ray: negative for fracture, AC joint separation   Patient Stated Goals Reduced Lt shoulder pain, return to using Lt UT, return to driving   Currently in Pain? Yes   Pain Score 2   up to 8/10 with use   Pain Location Shoulder   Pain Orientation Left   Pain Descriptors / Indicators Burning   Pain Type Acute pain   Pain Onset  1 to 4 weeks ago   Pain Frequency Constant   Aggravating Factors  moving Lt UE, use of Lt UE, not wearing the sling   Pain Relieving Factors rest, sling wear, ice            [facility] PT Assessment - 05/15/15 0001    Assessment   Medical Diagnosis Lt shoulder AC injury with possible preexisting cuff   Referring Provider French Ana, WD, MD   Onset Date/Surgical Date 05/01/15   Prior Therapy None   Precautions   Precautions Fall;Other (comment)  history of cancer (no [place]), osteopenia   Balance Screen   Has the patient fallen in the past 6 months Yes   How many times? 1  tripped going up a step that she couldn't see   Has the patient had a decrease in activity level because of a fear of falling?  No   Is the patient reluctant to leave their home because of a fear of falling?  No    Home Social worker Private residence   Living Arrangements Alone   Prior Function   Level of Independence Independent   Cognition   Overall Cognitive Status Within Functional Limits for tasks assessed   Observation/Other Assessments   Focus on Therapeutic Outcomes (FOTO)  61% limitation   Posture/Postural Control   Posture/Postural Control Postural limitations   Postural Limitations Rounded Shoulders;Forward head   ROM / Strength   AROM / PROM / Strength AROM;PROM;Strength   AROM   Overall AROM  Deficits   AROM Assessment Site Shoulder   Right/Left Shoulder Left   Left Shoulder Flexion 40 Degrees  pain   Left Shoulder ABduction 104 Degrees   Left Shoulder Internal Rotation --  too painful   Left Shoulder External Rotation --  too painful   PROM   Overall PROM  Deficits   Overall PROM Comments Rt shoulder PROM without pain   PROM Assessment Site Shoulder   Right/Left Shoulder Left   Left Shoulder Flexion 108 Degrees   Left Shoulder ABduction 91 Degrees   Left Shoulder Internal Rotation 90 Degrees   Left Shoulder External Rotation 10 Degrees   Strength   Overall Strength Deficits   Overall Strength Comments 4+/5 Rt shoulder strength, 4/5 Lt shoulder strength tested in neutral due to pain and limited AROM   Palpation   Palpation comment palpable tenderness over Lt AC joint, supraspinatus and distal UT at insertion at shoulder.Lt pectoralis at mastectomy site is tender with reduced muscle length   Ambulation/Gait   Ambulation/Gait Yes   Ambulation/Gait Assistance 7: Independent                   [facility] Adult PT Treatment/Exercise - 05/15/15 0001    Modalities   Modalities Iontophoresis   Iontophoresis   Type of Iontophoresis Dexamethasone   Location Lt AC joint   Dose 1.0 cc  #1   Time 6 hour wear time                PT Education - 05/15/15 1518    Education provided Yes   Education Details ionto instructions, sling exercises,  AAROM Lt shoulder   Person(s) Educated Patient   Methods Explanation;Handout   Comprehension Verbalized understanding;Returned demonstration          PT Short Term Goals - 05/15/15 1533    PT SHORT TERM GOAL #1   Title be independent in initial HEP   Time 4   Period Weeks  Status New   PT SHORT TERM GOAL #2   Title demonstrate Lt shoulder AROM flexion to > or = to 90 degrees to allow for reaching into microwave   Time 4   Period Weeks   Status New   PT SHORT TERM GOAL #3   Title report < or = to 5/10 Lt shoulder pain with use    Time 4   Period Weeks   Status New   PT SHORT TERM GOAL #4   Title wean from sling and report > or = to 50% use of Lt UE with ADLs and self-care   Time 4   Period Weeks   Status New           PT Long Term Goals - 2015/06/02 1447    PT LONG TERM GOAL #1   Title be independent in advanced HEP   Time 8   Period Weeks   Status New   PT LONG TERM GOAL #2   Title reduce FOTO to < or = to 36% limitation   Time 8   Period Weeks   Status New   PT LONG TERM GOAL #3   Title demonstrate Lt shoulder AROM flexion to > or = to 120 degrees to allow for reaching overhead   Time 8   Period Weeks   Status New   PT LONG TERM GOAL #4   Title report < or = to 3/10 Lt shoulder pain with use    Time 8   Period Weeks   Status New   PT LONG TERM GOAL #5   Title increase Lt shoulder strength to improve use of Lt UE to > or = to 75%    Time 8   Period Weeks   Status New               Plan - 06/02/2015 1528    Clinical Impression Statement Pt is a Rt hand dominant female who presents to PT with Lt shoulder pain s/p fall 2 weeks ago when she landed on her Lt shoulder.  X-rays revealed Lt AC joint separation.  Pt is wearing a sling now for comfort.  Pt demonstrates limited Lt shoulder AROM, strength and pain over Lt pectoralis and AC joint.  Pt with limited use of Lt UE and will benefit from skilled PT for AROM, strength progression, functional use and  treatment for pain as needed.     Pt will benefit from skilled therapeutic intervention in order to improve on the following deficits Postural dysfunction;Decreased strength;Impaired flexibility;Pain;Decreased activity tolerance;Decreased range of motion;Increased muscle spasms;Increased fascial restricitons   Rehab Potential Good   PT Frequency 2x / week   PT Duration 8 weeks   PT Treatment/Interventions ADLs/Self Care Home Management;Cryotherapy;Moist Heat;Iontophoresis '4mg'$ /ml Dexamethasone;Functional mobility training;Therapeutic activities;Therapeutic exercise;Manual techniques;Patient/family education;Neuromuscular re-education;Passive range of motion   PT Next Visit Plan Continue ionto, Lt shoulder AAROM, strength (gentle), manual, modalities as needed for pain   Consulted and Agree with Plan of Care Patient          G-Codes - 06/02/15 1445    Functional Assessment Tool Used FOTO: 61% limitation   Functional Limitation Other PT primary   Other PT Primary Current Status (Y6599) At least 60 percent but less than 80 percent impaired, limited or restricted   Other PT Primary Goal Status (J5701) At least 20 percent but less than 40 percent impaired, limited or restricted       Problem List Patient Active Problem List  Diagnosis Date Noted  . Genetic testing 05/23/2014  . History of breast cancer   . Family history of breast cancer   . Endometrial cancer ([place]) 03/21/2014  . Uterine cancer ([place]) 03/21/2014  . S/P mastectomy, bilateral 05/02/2013  . Situational stress 05/02/2013  . Osteoporosis, unspecified 05/02/2013  . Breast cancer, left breast ([place]) 05/02/2013    Shakaria Raphael, PT 05/15/2015, 3:39 PM  [facility] [address] [place], [place], [address] Phone: [phone]   Fax:  [phone]  Name: SIMRIN VEGH MRN: [id number] Date of Birth: May 20, 1940

## 2015-05-15 NOTE — Patient Instructions (Signed)
ROM: Pendulum (Circular)  Let right arm move in circle clockwise, then counterclockwise, by rocking body weight in circular pattern. Circle _10___ times each direction per set. Do _3___ sessions per day.   Finger Flexors  Keeping right fingertips straight, press putty toward base of palm. Repeat __20__ times. Do __3__ sessions per day. Activity: Squeeze flour sifter, plastic squeeze bottles, [place] baster, juice from fruit.*    SHOULDER: Flexion On Table  Place hands on table, elbows straight. Move hips away from body. Press hands down into table. Hold _3__ seconds. _10__ reps per set, __3_ sets per day.  Elevation: Shrug (Distal Resist)  Also add circles forward and back   Lift shoulders straight up, then return. Maintain same speed up and down. Avoid moving head and neck forward. Repeat _10___ times per set. Do __1-2__ sets per session. Do many times daily. Copyright  VHI. All rights reserved.   Hold all stretches 5-10 seconds and perform 5-10 times, 3 times a day.      Clasp hands together and raise arms above head, keeping elbows as straight as possible. Can be done sitting or lying.   Copyright  VHI. All rights reserved.  IONTOPHORESIS PATIENT PRECAUTIONS & CONTRAINDICATIONS:  . Redness under one or both electrodes can occur.  This characterized by a uniform redness that usually disappears within 12 hours of treatment. . Small pinhead size blisters may result in response to the drug.  Contact your physician if the problem persists more than 24 hours. . On rare occasions, iontophoresis therapy can result in temporary skin reactions such as rash, inflammation, irritation or burns.  The skin reactions may be the result of individual sensitivity to the ionic solution used, the condition of the skin at the start of treatment, reaction to the materials in the electrodes, allergies or sensitivity to dexamethasone, or a poor connection between the patch and your skin.  Discontinue  using iontophoresis if you have any of these reactions and report to your therapist. . Remove the Patch or electrodes if you have any undue sensation of pain or burning during the treatment and report discomfort to your therapist. . Tell your Therapist if you have had known adverse reactions to the application of electrical current. . If using the Patch, the LED light will turn off when treatment is complete and the patch can be removed.  Approximate treatment time is 1-3 hours.  Remove the patch when light goes off or after 6 hours. . The Patch can be worn during normal activity, however excessive motion where the electrodes have been placed can cause poor contact between the skin and the electrode or uneven electrical current resulting in greater risk of skin irritation. Marland Kitchen Keep out of the reach of children.   . DO NOT use if you have a cardiac pacemaker or any other electrically sensitive implanted device. . DO NOT use if you have a known sensitivity to dexamethasone. . DO NOT use during Magnetic Resonance Imaging (MRI). . DO NOT use over broken or compromised skin (e.g. sunburn, cuts, or acne) due to the increased risk of skin reaction. . DO NOT SHAVE over the area to be treated:  To establish good contact between the Patch and the skin, excessive hair may be clipped. . DO NOT place the Patch or electrodes on or over your eyes, directly over your heart, or brain. . DO NOT reuse the Patch or electrodes as this may cause burns to occur.  [facility] [address]  [place], [place] [address] Phone # [phone] Fax [phone]

## 2015-05-17 ENCOUNTER — Ambulatory Visit: Payer: PPO

## 2015-05-17 DIAGNOSIS — M25612 Stiffness of left shoulder, not elsewhere classified: Secondary | ICD-10-CM

## 2015-05-17 DIAGNOSIS — M25512 Pain in left shoulder: Secondary | ICD-10-CM | POA: Diagnosis not present

## 2015-05-17 DIAGNOSIS — R29898 Other symptoms and signs involving the musculoskeletal system: Secondary | ICD-10-CM

## 2015-05-17 NOTE — Patient Instructions (Addendum)
Strengthening: Isometric Flexion  Using wall for resistance, press right fist into ball using light pressure. Hold _5___ seconds. Repeat _10___ times per set. Do _1-2___ sets per session. Do 1-2____ sessions per day.    Extension (Isometric)  Place left bent elbow and back of arm against wall. Press elbow against wall. Hold _5___ seconds. Repeat 10____ times. Do 1-2 sets.  Do _1-2___ sessions per day.  Strengthening: Isometric Adduction    Using body for resistance, gently press right arm into ball using light pressure. Hold _5___ seconds. Repeat _10___ times per set. Do _1-2___ sets per session. Do __1-2__ sessions per day.  http://orth.exer.us/811   Copyright  VHI. All rights reserved.   IONTOPHORESIS PATIENT PRECAUTIONS & CONTRAINDICATIONS:  . Redness under one or both electrodes can occur.  This characterized by a uniform redness that usually disappears within 12 hours of treatment. . Small pinhead size blisters may result in response to the drug.  Contact your physician if the problem persists more than 24 hours. . On rare occasions, iontophoresis therapy can result in temporary skin reactions such as rash, inflammation, irritation or burns.  The skin reactions may be the result of individual sensitivity to the ionic solution used, the condition of the skin at the start of treatment, reaction to the materials in the electrodes, allergies or sensitivity to dexamethasone, or a poor connection between the patch and your skin.  Discontinue using iontophoresis if you have any of these reactions and report to your therapist. . Remove the Patch or electrodes if you have any undue sensation of pain or burning during the treatment and report discomfort to your therapist. . Tell your Therapist if you have had known adverse reactions to the application of electrical current. . If using the Patch, the LED light will turn off when treatment is complete and the patch can be removed.   Approximate treatment time is 1-3 hours.  Remove the patch when light goes off or after 6 hours. . The Patch can be worn during normal activity, however excessive motion where the electrodes have been placed can cause poor contact between the skin and the electrode or uneven electrical current resulting in greater risk of skin irritation. Marland Kitchen Keep out of the reach of children.   . DO NOT use if you have a cardiac pacemaker or any other electrically sensitive implanted device. . DO NOT use if you have a known sensitivity to dexamethasone. . DO NOT use during Magnetic Resonance Imaging (MRI). . DO NOT use over broken or compromised skin (e.g. sunburn, cuts, or acne) due to the increased risk of skin reaction. . DO NOT SHAVE over the area to be treated:  To establish good contact between the Patch and the skin, excessive hair may be clipped. . DO NOT place the Patch or electrodes on or over your eyes, directly over your heart, or brain. . DO NOT reuse the Patch or electrodes as this may cause burns to occur. [facility] [address] [place], [place] [address] Phone # [phone] Fax [phone]

## 2015-05-17 NOTE — Therapy (Signed)
[facility] [address] [place], [place], [address] Phone: [phone]   Fax:  [phone]  Physical Therapy Treatment  Patient Details  Name: Beth Simon MRN: [id number] Date of Birth: 09-17-1940 Referring Provider: Maudry Mayhew, MD  Encounter Date: 05/17/2015      PT End of Session - 05/17/15 1442    Visit Number 2   Number of Visits 10   Date for PT Re-Evaluation 07/10/15   PT Start Time 1402   PT Stop Time 1442   PT Time Calculation (min) 40 min   Activity Tolerance Patient tolerated treatment well   Behavior During Therapy [facility] for tasks assessed/performed      Past Medical History  Diagnosis Date  . Osteoporosis     on Evista  . Cystitis     cytoxen, had once or twice  . Vasovagal syncopes   . Macular degeneration   . Anemia   . Uterine fibroid   . Complication of anesthesia   . PONV (postoperative nausea and vomiting)   . Breast cancer ([facility]) 1990    bilateral mastectomy, adenoca breast-left MRM, reconstruction, chemo  . Bronchitis last 2 weeks    saw dr Pete Glatter 02-28-2014, he said no antibiotics needed, nonproductive cough  . Shoulder pain     taking physical therapy for last few weeks  . Neck pain     taking physictal therapy last 2 weeks  . History of breast cancer   . Uterine cancer ([facility]) 03/21/2014    MLH1/PMS2 LOH  . Family history of breast cancer   . History of radiation therapy 2/10, 2/12, 2/18, 2/24, 05/29/14    vaginal cuff/ 30 Gy/ 5 fx    Past Surgical History  Procedure Laterality Date  . Excision vaginal cyst    . Breast implant removal  6/94    left breast  . Cataract extraction Bilateral   . Radical mastectomy lnd  1990    left, chemo done  . Breast reconstruction  1990    left  . Mastectomy Right 10/08/06    prophylactic  . Robotic assisted total hysterectomy with bilateral salpingo oopherectomy Bilateral 03/21/2014    Procedure: ROBOTIC ASSISTED TOTAL  HYSTERECTOMY WITH BILATERAL SALPINGO OOPHORECTOMY ;  Surgeon: Laurette Schimke, MD;  Location[facility];  Service: Gynecology;  Laterality: Bilateral;    There were no vitals filed for this visit.  Visit Diagnosis:  Pain in joint of left shoulder  Shoulder weakness  Shoulder stiffness, left      Subjective Assessment - 05/17/15 1404    Subjective The ionto patch really helped last session   Currently in Pain? Yes   Pain Score 2    Pain Orientation Left   Pain Descriptors / Indicators Burning   Pain Type Acute pain   Pain Onset 1 to 4 weeks ago   Pain Frequency Constant   Aggravating Factors  moving Lt UE, reaching across the body with Lt UE, holding shower head   Pain Relieving Factors rest, ice                         [facility] Adult PT Treatment/Exercise - 05/17/15 0001    Exercises   Exercises Shoulder   Shoulder Exercises: Supine   Flexion AAROM;10 reps  supine with clasped hands   Other Supine Exercises supine on towel roll (vertical) with arms out to stretch pectoralis   Shoulder Exercises: Seated   Other Seated Exercises Lt  shoulder isometrics into flexion, extension and adduction 5" hold x 10   Shoulder Exercises: Standing   Flexion AAROM;Left;10 reps   Flexion Limitations using finger ladder   Shoulder Exercises: Pulleys   Flexion 3 minutes   Shoulder Exercises: Stretch   Table Stretch - Flexion 5 reps;10 seconds   Iontophoresis   Type of Iontophoresis Dexamethasone   Location Lt AC joint   Dose 1.0 cc  #2   Time 6 hour wear time   Manual Therapy   Manual Therapy Passive ROM;Myofascial release;Soft tissue mobilization   Soft tissue mobilization to Lt pectoralis and Lt shoulder external rotators   Myofascial Release to Lt pectoralis   Passive ROM flexion, IR, ER and abduction of LT shoulder to pt tolerance                PT Education - 05/17/15 1423    Education provided Yes   Education Details Lt shoulder isometrics, ionto  instructions   Person(s) Educated Patient   Methods Explanation;Handout   Comprehension Verbalized understanding;Returned demonstration          PT Short Term Goals - 05/15/15 1533    PT SHORT TERM GOAL #1   Title be independent in initial HEP   Time 4   Period Weeks   Status New   PT SHORT TERM GOAL #2   Title demonstrate Lt shoulder AROM flexion to > or = to 90 degrees to allow for reaching into microwave   Time 4   Period Weeks   Status New   PT SHORT TERM GOAL #3   Title report < or = to 5/10 Lt shoulder pain with use    Time 4   Period Weeks   Status New   PT SHORT TERM GOAL #4   Title wean from sling and report > or = to 50% use of Lt UE with ADLs and self-care   Time 4   Period Weeks   Status New           PT Long Term Goals - 05/15/15 1447    PT LONG TERM GOAL #1   Title be independent in advanced HEP   Time 8   Period Weeks   Status New   PT LONG TERM GOAL #2   Title reduce FOTO to < or = to 36% limitation   Time 8   Period Weeks   Status New   PT LONG TERM GOAL #3   Title demonstrate Lt shoulder AROM flexion to > or = to 120 degrees to allow for reaching overhead   Time 8   Period Weeks   Status New   PT LONG TERM GOAL #4   Title report < or = to 3/10 Lt shoulder pain with use    Time 8   Period Weeks   Status New   PT LONG TERM GOAL #5   Title increase Lt shoulder strength to improve use of Lt UE to > or = to 75%    Time 8   Period Weeks   Status New               Plan - 05/17/15 1405    Clinical Impression Statement Pt with 1 session after evaluation.  Pt had fall 2 weeks ago with Lt shoulder injury.  X-rays revealed LT AC joint seperation.  Pt has weaned from the sling as it was uncomfortable. Pt demonstrates improved movement of Lt UE with exercise today.  Pt with continued Lt  shoulder AROM and strength deficits and will continue to benefit from skilled PT for AROM, strength progression, functional use and treatment for pain as  needed.     Pt will benefit from skilled therapeutic intervention in order to improve on the following deficits Postural dysfunction;Decreased strength;Impaired flexibility;Pain;Decreased activity tolerance;Decreased range of motion;Increased muscle spasms;Increased fascial restricitons   Rehab Potential Good   PT Frequency 2x / week   PT Duration 8 weeks   PT Treatment/Interventions ADLs/Self Care Home Management;Cryotherapy;Moist Heat;Iontophoresis '4mg'$ /ml Dexamethasone;Functional mobility training;Therapeutic activities;Therapeutic exercise;Manual techniques;Patient/family education;Neuromuscular re-education;Passive range of motion   PT Next Visit Plan Continue ionto, Lt shoulder AAROM, strength (gentle), manual, modalities as needed for pain   Consulted and Agree with Plan of Care Patient        Problem List Patient Active Problem List   Diagnosis Date Noted  . Genetic testing 05/23/2014  . History of breast cancer   . Family history of breast cancer   . Endometrial cancer ([place]) 03/21/2014  . Uterine cancer ([place]) 03/21/2014  . S/P mastectomy, bilateral 05/02/2013  . Situational stress 05/02/2013  . Osteoporosis, unspecified 05/02/2013  . Breast cancer, left breast ([place]) 05/02/2013    Shaquita Fort, PT 05/17/2015, 2:44 PM  [facility] [address], [place], [place], [address] Phone: [phone]   Fax:  [phone]  Name: Beth Simon MRN: [id number] Date of Birth: 12/21/1940

## 2015-05-22 ENCOUNTER — Encounter: Payer: Self-pay | Admitting: Physical Therapy

## 2015-05-22 ENCOUNTER — Ambulatory Visit: Payer: PPO | Admitting: Physical Therapy

## 2015-05-22 DIAGNOSIS — M25612 Stiffness of left shoulder, not elsewhere classified: Secondary | ICD-10-CM

## 2015-05-22 DIAGNOSIS — M25512 Pain in left shoulder: Secondary | ICD-10-CM | POA: Diagnosis not present

## 2015-05-22 DIAGNOSIS — R29898 Other symptoms and signs involving the musculoskeletal system: Secondary | ICD-10-CM

## 2015-05-22 NOTE — Therapy (Signed)
[facility] [address] [place], [place], [address] Phone: [phone]   Fax:  [phone]  Physical Therapy Treatment  Patient Details  Name: Beth Simon MRN: [id number] Date of Birth: 03-03-1941 Referring Provider: Bevelyn Buckles, MD  Encounter Date: 05/22/2015      PT End of Session - 05/22/15 1425    Visit Number 3   Number of Visits 10   Date for PT Re-Evaluation 07/10/15   PT Start Time 5035   PT Stop Time 1445   PT Time Calculation (min) 42 min   Activity Tolerance Patient tolerated treatment well   Behavior During Therapy [facility] for tasks assessed/performed      Past Medical History  Diagnosis Date  . Osteoporosis     on Evista  . Cystitis     cytoxen, had once or twice  . Vasovagal syncopes   . Macular degeneration   . Anemia   . Uterine fibroid   . Complication of anesthesia   . PONV (postoperative nausea and vomiting)   . Breast cancer ([place]) 1990    bilateral mastectomy, adenoca breast-left MRM, reconstruction, chemo  . Bronchitis last 2 weeks    saw dr Felipa Eth 02-28-2014, he said no antibiotics needed, nonproductive cough  . Shoulder pain     taking physical therapy for last few weeks  . Neck pain     taking physictal therapy last 2 weeks  . History of breast cancer   . Uterine cancer ([place]) 03/21/2014    MLH1/PMS2 LOH  . Family history of breast cancer   . History of radiation therapy 2/10, 2/12, 2/18, 2/24, 05/29/14    vaginal cuff/ 30 Gy/ 5 fx    Past Surgical History  Procedure Laterality Date  . Excision vaginal cyst    . Breast implant removal  6/94    left breast  . Cataract extraction Bilateral   . Radical mastectomy lnd  1990    left, chemo done  . Breast reconstruction  1990    left  . Mastectomy Right 10/08/06    prophylactic  . Robotic assisted total hysterectomy with bilateral salpingo oopherectomy Bilateral 03/21/2014    Procedure: ROBOTIC ASSISTED TOTAL  HYSTERECTOMY WITH BILATERAL SALPINGO OOPHORECTOMY ;  Surgeon: Janie Morning, MD;  Location: [facility];  Service: Gynecology;  Laterality: Bilateral;    There were no vitals filed for this visit.  Visit Diagnosis:  Pain in joint of left shoulder  Shoulder weakness  Shoulder stiffness, left      Subjective Assessment - 05/22/15 1413    Subjective Pt is able to drive now short distances, but left shoulder feels sore.  Pt reports putting on a Jacket and reaching with left Ue is getting easier.     Limitations Other (comment)   Diagnostic tests x-ray: negative for fracture, AC joint separation   Patient Stated Goals Reduced Lt shoulder pain, return to using Lt UT, return to driving   Currently in Pain? Yes   Pain Score 4   sorness   Pain Location Shoulder   Pain Orientation Left   Pain Descriptors / Indicators Burning   Pain Type Acute pain   Pain Onset 1 to 4 weeks ago   Pain Frequency Constant   Aggravating Factors  moving Lt UE, reaching across the body with Lt UE, holding shower head   Multiple Pain Sites No  [place] [place] Adult PT Treatment/Exercise - 05/22/15 0001    Posture/Postural Control   Posture/Postural Control Postural limitations   Postural Limitations Rounded Shoulders;Forward head   Exercises   Exercises Shoulder   Shoulder Exercises: Supine   Flexion AAROM;10 reps   Other Supine Exercises supine on towel roll (vertical) with arms out to stretch pectoralis   Shoulder Exercises: Seated   Other Seated Exercises Lt shoulder isometrics into flexion, extension and adduction 5" hold x 10   Shoulder Exercises: Standing   Flexion AAROM;Left;10 reps   Flexion Limitations using finger ladder   Shoulder Exercises: Pulleys   Flexion 3 minutes   ABduction 3 minutes   Shoulder Exercises: Stretch   Table Stretch - Flexion 5 reps;10 seconds   Modalities   Modalities Iontophoresis   Iontophoresis   Type of Iontophoresis Dexamethasone    Location Lt AC joint   Dose 1.0 cc   Time 6 hour wear time   Manual Therapy   Manual Therapy Passive ROM;Myofascial release;Soft tissue mobilization   Soft tissue mobilization to Lt pectoralis and Lt shoulder external rotators   Myofascial Release to Lt pectoralis   Passive ROM flexion, IR, ER and abduction of LT shoulder to pt tolerance                  PT Short Term Goals - 05/22/15 1440    PT SHORT TERM GOAL #1   Title be independent in initial HEP   Time 4   Period Weeks   Status On-going   PT SHORT TERM GOAL #2   Title demonstrate Lt shoulder AROM flexion to > or = to 90 degrees to allow for reaching into microwave   Time 4   Period Weeks   Status On-going   PT SHORT TERM GOAL #3   Title report < or = to 5/10 Lt shoulder pain with use    Time 4   Period Weeks   Status On-going   PT SHORT TERM GOAL #4   Title wean from sling and report > or = to 50% use of Lt UE with ADLs and self-care   Time 4   Period Weeks   Status On-going           PT Long Term Goals - 05/15/15 1447    PT LONG TERM GOAL #1   Title be independent in advanced HEP   Time 8   Period Weeks   Status New   PT LONG TERM GOAL #2   Title reduce FOTO to < or = to 36% limitation   Time 8   Period Weeks   Status New   PT LONG TERM GOAL #3   Title demonstrate Lt shoulder AROM flexion to > or = to 120 degrees to allow for reaching overhead   Time 8   Period Weeks   Status New   PT LONG TERM GOAL #4   Title report < or = to 3/10 Lt shoulder pain with use    Time 8   Period Weeks   Status New   PT LONG TERM GOAL #5   Title increase Lt shoulder strength to improve use of Lt UE to > or = to 75%    Time 8   Period Weeks   Status New               Plan - 05/22/15 1425    Clinical Impression Statement Pt reports she feels 30% overall improvement since start of PT. AAROM is  improving as observed with exercises on wallladder.  AROM and strength is still limited. Pt will continue  to benefit from skilled PT to improve AROM and strength to improve funtional activities.       Pt will benefit from skilled therapeutic intervention in order to improve on the following deficits Postural dysfunction;Decreased strength;Impaired flexibility;Pain;Decreased activity tolerance;Decreased range of motion;Increased muscle spasms;Increased fascial restricitons   Rehab Potential Good   Clinical Impairments Affecting Rehab Potential None   PT Frequency 2x / week   PT Duration 8 weeks   PT Treatment/Interventions ADLs/Self Care Home Management;Cryotherapy;Moist Heat;Iontophoresis '4mg'$ /ml Dexamethasone;Functional mobility training;Therapeutic activities;Therapeutic exercise;Manual techniques;Patient/family education;Neuromuscular re-education;Passive range of motion   PT Next Visit Plan Continue ionto, Lt shoulder AAROM, strength (gentle), manual, modalities as needed for pain   PT Home Exercise Plan Current HEP   Consulted and Agree with Plan of Care Patient        Problem List Patient Active Problem List   Diagnosis Date Noted  . Genetic testing 05/23/2014  . History of breast cancer   . Family history of breast cancer   . Endometrial cancer ([place]) 03/21/2014  . Uterine cancer ([place]) 03/21/2014  . S/P mastectomy, bilateral 05/02/2013  . Situational stress 05/02/2013  . Osteoporosis, unspecified 05/02/2013  . Breast cancer, left breast ([place]) 05/02/2013    NAUMANN-HOUEGNIFIO,Lizbet Cirrincione PTA 05/22/2015, 2:42 PM  [facility] [address] [place], [place], [address] Phone: [phone]   Fax:  [phone]  Name: DEJANAE HELSER MRN: [id number] Date of Birth: 05/15/1940

## 2015-05-24 ENCOUNTER — Ambulatory Visit: Payer: PPO

## 2015-05-24 DIAGNOSIS — M25612 Stiffness of left shoulder, not elsewhere classified: Secondary | ICD-10-CM

## 2015-05-24 DIAGNOSIS — M25512 Pain in left shoulder: Secondary | ICD-10-CM

## 2015-05-24 DIAGNOSIS — R29898 Other symptoms and signs involving the musculoskeletal system: Secondary | ICD-10-CM

## 2015-05-24 NOTE — Therapy (Signed)
[facility] [address] [place], [place], [address] Phone: [phone]   Fax:  [phone]  Physical Therapy Treatment  Patient Details  Name: Beth Simon MRN: [id number] Date of Birth: October 12, 1940 Referring Provider: Bevelyn Buckles, MD  Encounter Date: 05/24/2015      PT End of Session - 05/24/15 1448    Visit Number 4   Number of Visits 10   Date for PT Re-Evaluation 07/10/15   PT Start Time 1401   PT Stop Time 1448   PT Time Calculation (min) 47 min   Activity Tolerance Patient tolerated treatment well   Behavior During Therapy [facility] for tasks assessed/performed      Past Medical History  Diagnosis Date  . Osteoporosis     on Evista  . Cystitis     cytoxen, had once or twice  . Vasovagal syncopes   . Macular degeneration   . Anemia   . Uterine fibroid   . Complication of anesthesia   . PONV (postoperative nausea and vomiting)   . Breast cancer ([place]) 1990    bilateral mastectomy, adenoca breast-left MRM, reconstruction, chemo  . Bronchitis last 2 weeks    saw dr Felipa Eth 02-28-2014, he said no antibiotics needed, nonproductive cough  . Shoulder pain     taking physical therapy for last few weeks  . Neck pain     taking physictal therapy last 2 weeks  . History of breast cancer   . Uterine cancer ([place]) 03/21/2014    MLH1/PMS2 LOH  . Family history of breast cancer   . History of radiation therapy 2/10, 2/12, 2/18, 2/24, 05/29/14    vaginal cuff/ 30 Gy/ 5 fx    Past Surgical History  Procedure Laterality Date  . Excision vaginal cyst    . Breast implant removal  6/94    left breast  . Cataract extraction Bilateral   . Radical mastectomy lnd  1990    left, chemo done  . Breast reconstruction  1990    left  . Mastectomy Right 10/08/06    prophylactic  . Robotic assisted total hysterectomy with bilateral salpingo oopherectomy Bilateral 03/21/2014    Procedure: ROBOTIC ASSISTED TOTAL  HYSTERECTOMY WITH BILATERAL SALPINGO OOPHORECTOMY ;  Surgeon: Janie Morning, MD;  Location: [facility];  Service: Gynecology;  Laterality: Bilateral;    There were no vitals filed for this visit.  Visit Diagnosis:  Pain in joint of left shoulder  Shoulder weakness  Shoulder stiffness, left      Subjective Assessment - 05/24/15 1407    Subjective My Lt pectoralis is so tight and painful.  Pt reports that Lt shoulder is feeling better.  Pt reports 50% overall improvement.     Patient Stated Goals Reduce Lt shoulder pain, return to using Lt UT, return to driving   Currently in Pain? Yes   Pain Score 4    Pain Orientation Left   Pain Descriptors / Indicators Burning   Pain Type Acute pain   Pain Onset 1 to 4 weeks ago   Pain Frequency Constant                         [facility] Adult PT Treatment/Exercise - 05/24/15 0001    Shoulder Exercises: Supine   Flexion AAROM;10 reps  with cane   Shoulder Exercises: Seated   Other Seated Exercises shoulder shrugs x 10   Shoulder Exercises: Standing   Flexion AAROM;Left;10 reps  Flexion Limitations using finger ladder   ABduction AAROM;Left;20 reps   ABduction Limitations using finger ladder   Other Standing Exercises cone stack to 2nd shelf: 1x2 minutes, 1x 1 minute   Shoulder Exercises: Pulleys   Flexion 3 minutes   ABduction 3 minutes   Shoulder Exercises: Stretch   Corner Stretch 3 reps;20 seconds   Iontophoresis   Type of Iontophoresis Dexamethasone   Location Lt AC joint   Dose 1.0 cc  #4   Time 6 hour wear time   Manual Therapy   Manual Therapy Passive ROM;Myofascial release;Soft tissue mobilization   Soft tissue mobilization to Lt pectoralis and Lt shoulder external rotators   Myofascial Release to Lt pectoralis                PT Education - 05/24/15 1414    Education provided Yes   Education Details ionto instructions, wall slides, pec stretch in doorway   Person(s) Educated Patient   Methods  Explanation;Handout   Comprehension Verbalized understanding;Returned demonstration          PT Short Term Goals - 05/24/15 1408    PT SHORT TERM GOAL #1   Title be independent in initial HEP   Status Achieved   PT SHORT TERM GOAL #3   Title report < or = to 5/10 Lt shoulder pain with use    Status On-going  inconsistent 4-6/10   PT SHORT TERM GOAL #4   Title wean from sling and report > or = to 50% use of Lt UE with ADLs and self-care   Status Achieved           PT Long Term Goals - 05/15/15 1447    PT LONG TERM GOAL #1   Title be independent in advanced HEP   Time 8   Period Weeks   Status New   PT LONG TERM GOAL #2   Title reduce FOTO to < or = to 36% limitation   Time 8   Period Weeks   Status New   PT LONG TERM GOAL #3   Title demonstrate Lt shoulder AROM flexion to > or = to 120 degrees to allow for reaching overhead   Time 8   Period Weeks   Status New   PT LONG TERM GOAL #4   Title report < or = to 3/10 Lt shoulder pain with use    Time 8   Period Weeks   Status New   PT LONG TERM GOAL #5   Title increase Lt shoulder strength to improve use of Lt UE to > or = to 75%    Time 8   Period Weeks   Status New               Problem List Patient Active Problem List   Diagnosis Date Noted  . Genetic testing 05/23/2014  . History of breast cancer   . Family history of breast cancer   . Endometrial cancer ([place]) 03/21/2014  . Uterine cancer ([place]) 03/21/2014  . S/P mastectomy, bilateral 05/02/2013  . Situational stress 05/02/2013  . Osteoporosis, unspecified 05/02/2013  . Breast cancer, left breast ([place]) 05/02/2013    TAKACS,KELLY, PT 05/24/2015, 2:49 PM  [facility] [address], [place], [place], [address] Phone: [phone]   Fax:  [phone]  Name: Beth Simon MRN: [id number] Date of Birth: [birth year]

## 2015-05-24 NOTE — Patient Instructions (Signed)
Flexibility: Corner Stretch    Standing in corner with hands just above shoulder level and feet ____ inches from corner, lean forward until a comfortable stretch is felt across chest. Hold _10___ seconds. Repeat _5___ times per set. Do _1___ sets per session. Do __2__ sessions per day.  http://orth.exer.us/343   Copyright  VHI. All rights reserved.   Walk Up Exercise (Active/Assistive)    With elbow straight, use fingers to "crawl" up wall or door frame as far as possible. Hold _1-___ seconds. Repeat _5___ times. Do _2___ sessions per day.  Copyright  VHI. All rights reserved.  IONTOPHORESIS PATIENT PRECAUTIONS & CONTRAINDICATIONS:  . Redness under one or both electrodes can occur.  This characterized by a uniform redness that usually disappears within 12 hours of treatment. . Small pinhead size blisters may result in response to the drug.  Contact your physician if the problem persists more than 24 hours. . On rare occasions, iontophoresis therapy can result in temporary skin reactions such as rash, inflammation, irritation or burns.  The skin reactions may be the result of individual sensitivity to the ionic solution used, the condition of the skin at the start of treatment, reaction to the materials in the electrodes, allergies or sensitivity to dexamethasone, or a poor connection between the patch and your skin.  Discontinue using iontophoresis if you have any of these reactions and report to your therapist. . Remove the Patch or electrodes if you have any undue sensation of pain or burning during the treatment and report discomfort to your therapist. . Tell your Therapist if you have had known adverse reactions to the application of electrical current. . If using the Patch, the LED light will turn off when treatment is complete and the patch can be removed.  Approximate treatment time is 1-3 hours.  Remove the patch when light goes off or after 6 hours. . The Patch can be worn  during normal activity, however excessive motion where the electrodes have been placed can cause poor contact between the skin and the electrode or uneven electrical current resulting in greater risk of skin irritation. Marland Kitchen Keep out of the reach of children.   . DO NOT use if you have a cardiac pacemaker or any other electrically sensitive implanted device. . DO NOT use if you have a known sensitivity to dexamethasone. . DO NOT use during Magnetic Resonance Imaging (MRI). . DO NOT use over broken or compromised skin (e.g. sunburn, cuts, or acne) due to the increased risk of skin reaction. . DO NOT SHAVE over the area to be treated:  To establish good contact between the Patch and the skin, excessive hair may be clipped. . DO NOT place the Patch or electrodes on or over your eyes, directly over your heart, or brain. . DO NOT reuse the Patch or electrodes as this may cause burns to occur. [facility] [address], [place], [place] [address] Phone # [phone] Fax [phone]

## 2015-05-28 DIAGNOSIS — M25512 Pain in left shoulder: Secondary | ICD-10-CM | POA: Diagnosis not present

## 2015-05-29 ENCOUNTER — Other Ambulatory Visit: Payer: Self-pay | Admitting: Geriatric Medicine

## 2015-05-29 ENCOUNTER — Encounter: Payer: Self-pay | Admitting: Physical Therapy

## 2015-05-29 ENCOUNTER — Ambulatory Visit: Payer: PPO | Admitting: Physical Therapy

## 2015-05-29 DIAGNOSIS — M25512 Pain in left shoulder: Secondary | ICD-10-CM

## 2015-05-29 DIAGNOSIS — R29898 Other symptoms and signs involving the musculoskeletal system: Secondary | ICD-10-CM

## 2015-05-29 DIAGNOSIS — M25612 Stiffness of left shoulder, not elsewhere classified: Secondary | ICD-10-CM

## 2015-05-29 DIAGNOSIS — R9389 Abnormal findings on diagnostic imaging of other specified body structures: Secondary | ICD-10-CM

## 2015-05-29 NOTE — Therapy (Signed)
[facility] [address], [place], [place], [address] Phone: [phone]   Fax:  [phone]  Physical Therapy Treatment  Patient Details  Name: Beth Simon MRN: [id number] Date of Birth: 10/27/1940 Referring Provider: Bevelyn Buckles, MD  Encounter Date: 05/29/2015      PT End of Session - 05/29/15 1411    Visit Number 5   Number of Visits 10   Date for PT Re-Evaluation 07/10/15   PT Start Time 1400   PT Stop Time 1447   PT Time Calculation (min) 47 min   Activity Tolerance Patient tolerated treatment well   Behavior During Therapy [facility] for tasks assessed/performed      Past Medical History  Diagnosis Date  . Osteoporosis     on Evista  . Cystitis     cytoxen, had once or twice  . Vasovagal syncopes   . Macular degeneration   . Anemia   . Uterine fibroid   . Complication of anesthesia   . PONV (postoperative nausea and vomiting)   . Breast cancer ([place]) 1990    bilateral mastectomy, adenoca breast-left MRM, reconstruction, chemo  . Bronchitis last 2 weeks    saw dr Felipa Eth 02-28-2014, he said no antibiotics needed, nonproductive cough  . Shoulder pain     taking physical therapy for last few weeks  . Neck pain     taking physictal therapy last 2 weeks  . History of breast cancer   . Uterine cancer ([place]) 03/21/2014    MLH1/PMS2 LOH  . Family history of breast cancer   . History of radiation therapy 2/10, 2/12, 2/18, 2/24, 05/29/14    vaginal cuff/ 30 Gy/ 5 fx    Past Surgical History  Procedure Laterality Date  . Excision vaginal cyst    . Breast implant removal  6/94    left breast  . Cataract extraction Bilateral   . Radical mastectomy lnd  1990    left, chemo done  . Breast reconstruction  1990    left  . Mastectomy Right 10/08/06    prophylactic  . Robotic assisted total hysterectomy with bilateral salpingo oopherectomy Bilateral 03/21/2014    Procedure: ROBOTIC ASSISTED TOTAL  HYSTERECTOMY WITH BILATERAL SALPINGO OOPHORECTOMY ;  Surgeon: Janie Morning, MD;  [facility];  Service: Gynecology;  Laterality: Bilateral;    There were no vitals filed for this visit.  Visit Diagnosis:  Pain in joint of left shoulder  Shoulder weakness  Shoulder stiffness, left      Subjective Assessment - 05/29/15 1407    Subjective Pt reports after last session she had increase of pain in left shoulder. However, she still reports 50% overall improvement.   Limitations Other (comment)   Diagnostic tests x-ray: negative for fracture, AC joint separation   Patient Stated Goals Reduce Lt shoulder pain, return to using Lt UT, return to driving   Currently in Pain? Yes   Pain Score 6    Pain Location Shoulder   Pain Orientation Left   Pain Descriptors / Indicators Burning   Pain Type Acute pain   Pain Onset More than a month ago   Pain Frequency Constant   Aggravating Factors  moving Lt UE, reaching across the body with Lt UE, holding shower head   Pain Relieving Factors rest, ice   Multiple Pain Sites No  [facility] Adult PT Treatment/Exercise - 05/29/15 0001    Posture/Postural Control   Posture/Postural Control Postural limitations   Postural Limitations Rounded Shoulders;Forward head   Exercises   Exercises Shoulder   Shoulder Exercises: Supine   Flexion AAROM;10 reps  with cane   Shoulder Exercises: Seated   Other Seated Exercises shoulder shrugs x 10   Shoulder Exercises: Standing   Flexion Limitations using finger ladder   ABduction AAROM;Left;20 reps   ABduction Limitations using finger ladder   Other Standing Exercises cone stack to 2nd shelf: 1x2 minutes, 1x 1 minute   Shoulder Exercises: Pulleys   Flexion 3 minutes   ABduction 3 minutes   Shoulder Exercises: Stretch   Corner Stretch 3 reps;20 seconds   Modalities   Modalities Iontophoresis   Iontophoresis   Type of Iontophoresis Dexamethasone   Location Lt AC  joint   Dose #5   1.0 cc   Time 6 hour wear time   Manual Therapy   Manual Therapy Passive ROM;Myofascial release;Soft tissue mobilization   Soft tissue mobilization to Lt pectoralis and Lt shoulder external rotators   Myofascial Release to Lt pectoralis                  PT Short Term Goals - 05/29/15 1415    PT SHORT TERM GOAL #1   Title be independent in initial HEP   Time 4   Period Weeks   Status Achieved   PT SHORT TERM GOAL #2   Title demonstrate Lt shoulder AROM flexion to > or = to 90 degrees to allow for reaching into microwave   Time 4   Period Weeks   Status On-going   PT SHORT TERM GOAL #3   Title report < or = to 5/10 Lt shoulder pain with use    Time 4   Period Weeks   Status Achieved   PT SHORT TERM GOAL #4   Title wean from sling and report > or = to 50% use of Lt UE with ADLs and self-care   Time 4   Period Weeks   Status Achieved           PT Long Term Goals - 05/15/15 1447    PT LONG TERM GOAL #1   Title be independent in advanced HEP   Time 8   Period Weeks   Status New   PT LONG TERM GOAL #2   Title reduce FOTO to < or = to 36% limitation   Time 8   Period Weeks   Status New   PT LONG TERM GOAL #3   Title demonstrate Lt shoulder AROM flexion to > or = to 120 degrees to allow for reaching overhead   Time 8   Period Weeks   Status New   PT LONG TERM GOAL #4   Title report < or = to 3/10 Lt shoulder pain with use    Time 8   Period Weeks   Status New   PT LONG TERM GOAL #5   Title increase Lt shoulder strength to improve use of Lt UE to > or = to 75%    Time 8   Period Weeks   Status New               Plan - 05/29/15 1412    Clinical Impression Statement Pt reports her improvement as 50% despite the flare up this weekend. AROM and strength is still limited and pectoralis is tight to palpate. Pt will  conitnue to benefit from skilled PT to improve AROM and strength to improve functional activities.     Pt will  benefit from skilled therapeutic intervention in order to improve on the following deficits Postural dysfunction;Decreased strength;Impaired flexibility;Pain;Decreased activity tolerance;Decreased range of motion;Increased muscle spasms;Increased fascial restricitons   Rehab Potential Good   Clinical Impairments Affecting Rehab Potential None   PT Frequency 2x / week   PT Duration 8 weeks   PT Treatment/Interventions ADLs/Self Care Home Management;Cryotherapy;Moist Heat;Iontophoresis '4mg'$ /ml Dexamethasone;Functional mobility training;Therapeutic activities;Therapeutic exercise;Manual techniques;Patient/family education;Neuromuscular re-education;Passive range of motion   PT Next Visit Plan [place] #6, Lt shoulder AAROM, strength (gentle), manual, modalities as needed for pain   PT Home Exercise Plan Current HEP   Recommended Other Services None   Consulted and Agree with Plan of Care Patient        Problem List Patient Active Problem List   Diagnosis Date Noted  . Genetic testing 05/23/2014  . History of breast cancer   . Family history of breast cancer   . Endometrial cancer ([place]) 03/21/2014  . Uterine cancer ([place]) 03/21/2014  . S/P mastectomy, bilateral 05/02/2013  . Situational stress 05/02/2013  . Osteoporosis, unspecified 05/02/2013  . Breast cancer, left breast ([place]) 05/02/2013    NAUMANN-HOUEGNIFIO,Demetrus Pavao PTA 05/29/2015, 4:32 PM  [facility] [address] [place], [place], [address] Phone: [phone]   Fax:  [phone]  Name: Beth Simon MRN: [id number] Date of Birth: [birth year]

## 2015-05-31 ENCOUNTER — Encounter: Payer: Self-pay | Admitting: Physical Therapy

## 2015-05-31 ENCOUNTER — Ambulatory Visit: Payer: PPO | Attending: Orthopedic Surgery | Admitting: Physical Therapy

## 2015-05-31 DIAGNOSIS — M25512 Pain in left shoulder: Secondary | ICD-10-CM | POA: Diagnosis not present

## 2015-05-31 DIAGNOSIS — R29898 Other symptoms and signs involving the musculoskeletal system: Secondary | ICD-10-CM | POA: Insufficient documentation

## 2015-05-31 DIAGNOSIS — N8184 Pelvic muscle wasting: Secondary | ICD-10-CM | POA: Insufficient documentation

## 2015-05-31 DIAGNOSIS — M25612 Stiffness of left shoulder, not elsewhere classified: Secondary | ICD-10-CM

## 2015-05-31 DIAGNOSIS — H524 Presbyopia: Secondary | ICD-10-CM | POA: Diagnosis not present

## 2015-05-31 DIAGNOSIS — H04123 Dry eye syndrome of bilateral lacrimal glands: Secondary | ICD-10-CM | POA: Diagnosis not present

## 2015-05-31 NOTE — Therapy (Signed)
[facility] [address] [place], [place], [address] Phone: [phone]   Fax:  [phone]  Physical Therapy Treatment  Patient Details  Name: Beth Simon MRN: [id number] Date of Birth: Apr 06, 1940 Referring Provider: Bevelyn Buckles, MD  Encounter Date: 05/31/2015      PT End of Session - 05/31/15 1414    Visit Number 6   Number of Visits 10   Date for PT Re-Evaluation 07/10/15   PT Start Time 1400   PT Stop Time 1445   PT Time Calculation (min) 45 min   Activity Tolerance Patient tolerated treatment well   Behavior During Therapy [facility] for tasks assessed/performed      Past Medical History  Diagnosis Date  . Osteoporosis     on Evista  . Cystitis     cytoxen, had once or twice  . Vasovagal syncopes   . Macular degeneration   . Anemia   . Uterine fibroid   . Complication of anesthesia   . PONV (postoperative nausea and vomiting)   . Breast cancer ([place]) 1990    bilateral mastectomy, adenoca breast-left MRM, reconstruction, chemo  . Bronchitis last 2 weeks    saw dr Felipa Eth 02-28-2014, he said no antibiotics needed, nonproductive cough  . Shoulder pain     taking physical therapy for last few weeks  . Neck pain     taking physictal therapy last 2 weeks  . History of breast cancer   . Uterine cancer ([place]) 03/21/2014    MLH1/PMS2 LOH  . Family history of breast cancer   . History of radiation therapy 2/10, 2/12, 2/18, 2/24, 05/29/14    vaginal cuff/ 30 Gy/ 5 fx    Past Surgical History  Procedure Laterality Date  . Excision vaginal cyst    . Breast implant removal  6/94    left breast  . Cataract extraction Bilateral   . Radical mastectomy lnd  1990    left, chemo done  . Breast reconstruction  1990    left  . Mastectomy Right 10/08/06    prophylactic  . Robotic assisted total hysterectomy with bilateral salpingo oopherectomy Bilateral 03/21/2014    Procedure: ROBOTIC ASSISTED TOTAL  HYSTERECTOMY WITH BILATERAL SALPINGO OOPHORECTOMY ;  Surgeon: Janie Morning, MD;  Location: [facility];  Service: Gynecology;  Laterality: Bilateral;    There were no vitals filed for this visit.  Visit Diagnosis:  Pain in joint of left shoulder  Shoulder weakness  Shoulder stiffness, left      Subjective Assessment - 05/31/15 1409    Subjective Pt reports her improvement in left shoulder as 60%. Pt is may traveling to [place] since her sister passed away yesterday.     Limitations Other (comment)   Diagnostic tests x-ray: negative for fracture, AC joint separation   Patient Stated Goals Reduce Lt shoulder pain, return to using Lt UT, return to driving   Currently in Pain? Yes   Pain Score --  no c/o of pain with rest, but with movement 6/10   Pain Location Shoulder   Pain Orientation Left   Pain Descriptors / Indicators Burning   Pain Type Acute pain   Pain Onset More than a month ago   Pain Frequency Constant   Aggravating Factors  movifng Lt UE, reaching across the body with Lt UE, holding shower head   Pain Relieving Factors rest, ice   Multiple Pain Sites No  [facility] Adult PT Treatment/Exercise - 05/31/15 0001    Posture/Postural Control   Posture/Postural Control Postural limitations   Postural Limitations Rounded Shoulders;Forward head   Exercises   Exercises Shoulder   Shoulder Exercises: Supine   Flexion AAROM;10 reps   Shoulder Exercises: Seated   Other Seated Exercises shoulder shrugs x 10   Shoulder Exercises: Sidelying   External Rotation Strengthening;Left;20 reps  1# added   ABduction Strengthening;20 reps   Shoulder Exercises: Standing   Flexion AAROM;Left;10 reps   Flexion Limitations using finger ladder   ABduction AAROM;Left;10 reps   ABduction Limitations using finger ladder   Shoulder Exercises: Pulleys   Flexion 3 minutes   ABduction 3 minutes   Modalities   Modalities Iontophoresis   Iontophoresis    Type of Iontophoresis Dexamethasone   Location Lt AC joint   Dose #6 1.0 cc   Time 6 hour wear time   Manual Therapy   Manual Therapy Passive ROM;Myofascial release;Soft tissue mobilization   Soft tissue mobilization to Lt pectoralis and Lt shoulder external rotators   Myofascial Release to Lt pectoralis                  PT Short Term Goals - 05/29/15 1415    PT SHORT TERM GOAL #1   Title be independent in initial HEP   Time 4   Period Weeks   Status Achieved   PT SHORT TERM GOAL #2   Title demonstrate Lt shoulder AROM flexion to > or = to 90 degrees to allow for reaching into microwave   Time 4   Period Weeks   Status On-going   PT SHORT TERM GOAL #3   Title report < or = to 5/10 Lt shoulder pain with use    Time 4   Period Weeks   Status Achieved   PT SHORT TERM GOAL #4   Title wean from sling and report > or = to 50% use of Lt UE with ADLs and self-care   Time 4   Period Weeks   Status Achieved           PT Long Term Goals - 05/15/15 1447    PT LONG TERM GOAL #1   Title be independent in advanced HEP   Time 8   Period Weeks   Status New   PT LONG TERM GOAL #2   Title reduce FOTO to < or = to 36% limitation   Time 8   Period Weeks   Status New   PT LONG TERM GOAL #3   Title demonstrate Lt shoulder AROM flexion to > or = to 120 degrees to allow for reaching overhead   Time 8   Period Weeks   Status New   PT LONG TERM GOAL #4   Title report < or = to 3/10 Lt shoulder pain with use    Time 8   Period Weeks   Status New   PT LONG TERM GOAL #5   Title increase Lt shoulder strength to improve use of Lt UE to > or = to 75%    Time 8   Period Weeks   Status New               Plan - 05/31/15 1415    Clinical Impression Statement Pt with improved movement as noticed with activities on pulley's and walladder. However, still pain with certain movements up to 6/10 and pectoralis is tight to palpate. Pt will continue to benefit from skilled  PT  to improve ROM without pain. and strength   Pt will benefit from skilled therapeutic intervention in order to improve on the following deficits Postural dysfunction;Decreased strength;Impaired flexibility;Pain;Decreased activity tolerance;Decreased range of motion;Increased muscle spasms;Increased fascial restricitons   Rehab Potential Good   Clinical Impairments Affecting Rehab Potential None   PT Frequency 2x / week   PT Duration 8 weeks   PT Treatment/Interventions ADLs/Self Care Home Management;Cryotherapy;Moist Heat;Iontophoresis '4mg'$ /ml Dexamethasone;Functional mobility training;Therapeutic activities;Therapeutic exercise;Manual techniques;Patient/family education;Neuromuscular re-education;Passive range of motion   PT Next Visit Plan Lt shoulder AAROM, strength (gentle) manual, modalities PRN   PT Home Exercise Plan Current HEP   Consulted and Agree with Plan of Care Patient        Problem List Patient Active Problem List   Diagnosis Date Noted  . Genetic testing 05/23/2014  . History of breast cancer   . Family history of breast cancer   . Endometrial cancer ([place]) 03/21/2014  . Uterine cancer ([place]) 03/21/2014  . S/P mastectomy, bilateral 05/02/2013  . Situational stress 05/02/2013  . Osteoporosis, unspecified 05/02/2013  . Breast cancer, left breast ([place]) 05/02/2013    NAUMANN-HOUEGNIFIO,Tahirah Sara PTA 05/31/2015, 2:44 PM  [facility] [address], [place], [place], [address] Phone: [phone]   Fax:  [phone]  Name: Beth Simon MRN: [id number] Date of Birth: October 09, 1940

## 2015-06-01 ENCOUNTER — Ambulatory Visit
Admission: RE | Admit: 2015-06-01 | Discharge: 2015-06-01 | Disposition: A | Discharge status: Home / Self Care | Payer: PPO | Source: Ambulatory Visit | Attending: Geriatric Medicine | Admitting: Geriatric Medicine

## 2015-06-01 DIAGNOSIS — R9389 Abnormal findings on diagnostic imaging of other specified body structures: Secondary | ICD-10-CM

## 2015-06-01 DIAGNOSIS — R918 Other nonspecific abnormal finding of lung field: Secondary | ICD-10-CM | POA: Diagnosis not present

## 2015-06-01 MED ORDER — IOPAMIDOL (ISOVUE-300) INJECTION 61%
75.0000 mL | Freq: Once | INTRAVENOUS | Status: AC | PRN
  Administered 2015-06-01: 75 mL via INTRAVENOUS

## 2015-06-05 ENCOUNTER — Encounter: Payer: Self-pay | Admitting: Physical Therapy

## 2015-06-05 ENCOUNTER — Ambulatory Visit: Payer: PPO | Admitting: Physical Therapy

## 2015-06-05 DIAGNOSIS — R29898 Other symptoms and signs involving the musculoskeletal system: Secondary | ICD-10-CM

## 2015-06-05 DIAGNOSIS — M25512 Pain in left shoulder: Secondary | ICD-10-CM

## 2015-06-05 DIAGNOSIS — M25612 Stiffness of left shoulder, not elsewhere classified: Secondary | ICD-10-CM

## 2015-06-05 NOTE — Therapy (Signed)
[facility] [address] [place], [place], [address] Phone: [phone]   Fax:  [phone]  Physical Therapy Treatment  Patient Details  Name: Beth Simon MRN: [id number] Date of Birth: 1940/10/25 Referring Provider: Bevelyn Buckles, MD  Encounter Date: 06/05/2015      PT End of Session - 06/05/15 1414    Visit Number 7   Number of Visits 10   Date for PT Re-Evaluation 07/10/15   PT Start Time 1400   PT Stop Time 1445   PT Time Calculation (min) 45 min   Activity Tolerance Patient tolerated treatment well   Behavior During Therapy [facility] for tasks assessed/performed      Past Medical History  Diagnosis Date  . Osteoporosis     on Evista  . Cystitis     cytoxen, had once or twice  . Vasovagal syncopes   . Macular degeneration   . Anemia   . Uterine fibroid   . Complication of anesthesia   . PONV (postoperative nausea and vomiting)   . Breast cancer ([place]) 1990    bilateral mastectomy, adenoca breast-left MRM, reconstruction, chemo  . Bronchitis last 2 weeks    saw dr Felipa Eth 02-28-2014, he said no antibiotics needed, nonproductive cough  . Shoulder pain     taking physical therapy for last few weeks  . Neck pain     taking physictal therapy last 2 weeks  . History of breast cancer   . Uterine cancer ([place]) 03/21/2014    MLH1/PMS2 LOH  . Family history of breast cancer   . History of radiation therapy 2/10, 2/12, 2/18, 2/24, 05/29/14    vaginal cuff/ 30 Gy/ 5 fx    Past Surgical History  Procedure Laterality Date  . Excision vaginal cyst    . Breast implant removal  6/94    left breast  . Cataract extraction Bilateral   . Radical mastectomy lnd  1990    left, chemo done  . Breast reconstruction  1990    left  . Mastectomy Right 10/08/06    prophylactic  . Robotic assisted total hysterectomy with bilateral salpingo oopherectomy Bilateral 03/21/2014    Procedure: ROBOTIC ASSISTED TOTAL  HYSTERECTOMY WITH BILATERAL SALPINGO OOPHORECTOMY ;  Surgeon: Janie Morning, MD;  [facility];  Service: Gynecology;  Laterality: Bilateral;    There were no vitals filed for this visit.  Visit Diagnosis:  Pain in joint of left shoulder  Shoulder weakness  Shoulder stiffness, left      Subjective Assessment - 06/05/15 1412    Subjective Pt reports her improvement in left shoulder as 85%. Pt is traveling to [place] next Monday 13th of March   Diagnostic tests x-ray: negative for fracture, AC joint separation   Patient Stated Goals Reduce Lt shoulder pain, return to using Lt UT, return to driving   Currently in Pain? No/denies                         [facility] Adult PT Treatment/Exercise - 06/05/15 0001    Posture/Postural Control   Posture/Postural Control Postural limitations   Postural Limitations Rounded Shoulders;Forward head   Exercises   Exercises Shoulder   Shoulder Exercises: Seated   Other Seated Exercises shoulder shrugs x 10   Shoulder Exercises: Sidelying   External Rotation Strengthening;Left;20 reps  1# added   ABduction Strengthening;20 reps   Shoulder Exercises: Standing   Flexion AAROM;Left;10 reps   Flexion Limitations using  finger ladder   ABduction AAROM;Left;10 reps   ABduction Limitations using finger ladder   Other Standing Exercises cone stack to 2nd shelf: 1x2 minutes, 1x 1 minute   Shoulder Exercises: Pulleys   Flexion 3 minutes   ABduction 3 minutes   Modalities   Modalities Iontophoresis   Manual Therapy   Manual Therapy Passive ROM;Myofascial release;Soft tissue mobilization   Soft tissue mobilization to Lt pectoralis and Lt shoulder external rotators   Myofascial Release to Lt pectoralis                  PT Short Term Goals - 06/05/15 1418    PT SHORT TERM GOAL #1   Title be independent in initial HEP   Time 4   Period Weeks   Status Achieved   PT SHORT TERM GOAL #2   Title demonstrate Lt shoulder AROM  flexion to > or = to 90 degrees to allow for reaching into microwave  140 degrees in sitting   Time 4   Period Weeks   Status Achieved   PT SHORT TERM GOAL #3   Title report < or = to 5/10 Lt shoulder pain with use    Time 4   Period Weeks   Status Achieved   PT SHORT TERM GOAL #4   Title wean from sling and report > or = to 50% use of Lt UE with ADLs and self-care   Time 4   Period Weeks   Status Achieved           PT Long Term Goals - 06/05/15 1446    PT LONG TERM GOAL #1   Title be independent in advanced HEP   Time 8   Period Weeks   Status On-going   PT LONG TERM GOAL #2   Title reduce FOTO to < or = to 36% limitation   Time 8   Period Weeks   Status On-going   PT LONG TERM GOAL #3   Title demonstrate Lt shoulder AROM flexion to > or = to 120 degrees to allow for reaching overhead  140 degrees as of March 09/2015   Time 8   Period Weeks   Status On-going   PT LONG TERM GOAL #4   Title report < or = to 3/10 Lt shoulder pain with use    Time 8   Period Weeks   Status Partially Met   PT LONG TERM GOAL #5   Title increase Lt shoulder strength to improve use of Lt UE to > or = to 75%    Time 8   Period Weeks   Status Partially Met               Plan - 06/05/15 1415    Clinical Impression Statement Pt with improved movement and endurance as observed with activities on pulley's and walladder. Pt still with limittations into IR as noticed with slidding into jacket. Pt will continue to benefit from skilled PT to improve ROM without pain and strength.   Pt will benefit from skilled therapeutic intervention in order to improve on the following deficits Postural dysfunction;Decreased strength;Impaired flexibility;Pain;Decreased activity tolerance;Decreased range of motion;Increased muscle spasms;Increased fascial restricitons   Rehab Potential Good   Clinical Impairments Affecting Rehab Potential None   PT Frequency 2x / week   PT Duration 8 weeks   PT  Treatment/Interventions ADLs/Self Care Home Management;Cryotherapy;Moist Heat;Iontophoresis 50m/ml Dexamethasone;Functional mobility training;Therapeutic activities;Therapeutic exercise;Manual techniques;Patient/family education;Neuromuscular re-education;Passive range of motion   PT Next  Visit Plan Continue with shoulder AAROM, strength (gentle) manual, modalities PRN   PT Home Exercise Plan Current HEP   Consulted and Agree with Plan of Care Patient        Problem List Patient Active Problem List   Diagnosis Date Noted  . Genetic testing 05/23/2014  . History of breast cancer   . Family history of breast cancer   . Endometrial cancer ([place]) 03/21/2014  . Uterine cancer ([place]) 03/21/2014  . S/P mastectomy, bilateral 05/02/2013  . Situational stress 05/02/2013  . Osteoporosis, unspecified 05/02/2013  . Breast cancer, left breast ([place]) 05/02/2013    NAUMANN-HOUEGNIFIO,Eniya Cannady PTA 06/05/2015, 3:25 PM  [facility] [address], [place], [place], [address] Phone: [phone]   Fax:  [phone]  Name: Beth Simon MRN: [id number] Date of Birth: 1940-05-08

## 2015-06-07 ENCOUNTER — Encounter: Payer: Self-pay | Admitting: Physical Therapy

## 2015-06-07 ENCOUNTER — Ambulatory Visit: Payer: PPO | Admitting: Physical Therapy

## 2015-06-07 DIAGNOSIS — M6289 Other specified disorders of muscle: Secondary | ICD-10-CM

## 2015-06-07 DIAGNOSIS — M25612 Stiffness of left shoulder, not elsewhere classified: Secondary | ICD-10-CM

## 2015-06-07 DIAGNOSIS — M25512 Pain in left shoulder: Secondary | ICD-10-CM | POA: Diagnosis not present

## 2015-06-07 DIAGNOSIS — R29898 Other symptoms and signs involving the musculoskeletal system: Secondary | ICD-10-CM

## 2015-06-07 NOTE — Therapy (Signed)
[facility] [address] [place], [place], [address] Phone: [phone]   Fax:  [phone]  Physical Therapy Treatment  Patient Details  Name: Beth Simon MRN: [id number] Date of Birth: 15-May-1940 Referring Provider: Bevelyn Buckles, MD  Encounter Date: 06/07/2015      PT End of Session - 06/07/15 1408    Visit Number 8   Number of Visits 10   Date for PT Re-Evaluation 07/10/15   PT Start Time 6962   PT Stop Time 1445   PT Time Calculation (min) 40 min   Activity Tolerance Patient tolerated treatment well   Behavior During Therapy [facility] for tasks assessed/performed      Past Medical History  Diagnosis Date  . Osteoporosis     on Evista  . Cystitis     cytoxen, had once or twice  . Vasovagal syncopes   . Macular degeneration   . Anemia   . Uterine fibroid   . Complication of anesthesia   . PONV (postoperative nausea and vomiting)   . Breast cancer ([place]) 1990    bilateral mastectomy, adenoca breast-left MRM, reconstruction, chemo  . Bronchitis last 2 weeks    saw dr Felipa Eth 02-28-2014, he said no antibiotics needed, nonproductive cough  . Shoulder pain     taking physical therapy for last few weeks  . Neck pain     taking physictal therapy last 2 weeks  . History of breast cancer   . Uterine cancer ([place]) 03/21/2014    MLH1/PMS2 LOH  . Family history of breast cancer   . History of radiation therapy 2/10, 2/12, 2/18, 2/24, 05/29/14    vaginal cuff/ 30 Gy/ 5 fx    Past Surgical History  Procedure Laterality Date  . Excision vaginal cyst    . Breast implant removal  6/94    left breast  . Cataract extraction Bilateral   . Radical mastectomy lnd  1990    left, chemo done  . Breast reconstruction  1990    left  . Mastectomy Right 10/08/06    prophylactic  . Robotic assisted total hysterectomy with bilateral salpingo oopherectomy Bilateral 03/21/2014    Procedure: ROBOTIC ASSISTED TOTAL  HYSTERECTOMY WITH BILATERAL SALPINGO OOPHORECTOMY ;  Surgeon: Janie Morning, MD;  [facility];  Service: Gynecology;  Laterality: Bilateral;    There were no vitals filed for this visit.  Visit Diagnosis:  Pain in joint of left shoulder  Shoulder weakness  Shoulder stiffness, left  PFD (pelvic floor dysfunction)      Subjective Assessment - 06/07/15 1406    Subjective Pt reports using her arm a lot to get ready for her travel to [place] on Monday 13th of March. Reaching behind to slide into jacket is still limited   Limitations Other (comment);Lifting   Diagnostic tests x-ray: negative for fracture, AC joint separation   Patient Stated Goals Reduce Lt shoulder pain, return to using Lt UT, return to driving   Currently in Pain? No/denies                         [facility] Adult PT Treatment/Exercise - 06/07/15 0001    Posture/Postural Control   Posture/Postural Control Postural limitations   Postural Limitations Rounded Shoulders;Forward head   Exercises   Exercises Shoulder   Shoulder Exercises: Seated   Other Seated Exercises shoulder shrugs x 10   Shoulder Exercises: Sidelying   External Rotation Strengthening;Left;20 reps  1# added  ABduction Strengthening;20 reps   Shoulder Exercises: Standing   Flexion AAROM;Left;10 reps   Flexion Limitations using finger ladder   ABduction AAROM;Left;10 reps   ABduction Limitations using finger ladder   Other Standing Exercises cone stack to 2nd shelf: 1x2 minutes, 1x 1 minute   Shoulder Exercises: Pulleys   Flexion 3 minutes   ABduction 3 minutes   Other Pulley Exercises IR x 2 min in standing   Shoulder Exercises: Stretch   Internal Rotation Stretch 20 seconds  with towel in standing   Manual Therapy   Manual Therapy Passive ROM;Myofascial release;Soft tissue mobilization   Soft tissue mobilization to Lt pectoralis and Lt shoulder external rotators   Myofascial Release to Lt pectoralis                 PT Education - 06/07/15 1437    Education provided Yes   Education Details Ir stretch with towel   Person(s) Educated Patient   Methods Explanation;Handout   Comprehension Verbalized understanding;Returned demonstration          PT Short Term Goals - 06/05/15 1418    PT SHORT TERM GOAL #1   Title be independent in initial HEP   Time 4   Period Weeks   Status Achieved   PT SHORT TERM GOAL #2   Title demonstrate Lt shoulder AROM flexion to > or = to 90 degrees to allow for reaching into microwave  140 degrees in sitting   Time 4   Period Weeks   Status Achieved   PT SHORT TERM GOAL #3   Title report < or = to 5/10 Lt shoulder pain with use    Time 4   Period Weeks   Status Achieved   PT SHORT TERM GOAL #4   Title wean from sling and report > or = to 50% use of Lt UE with ADLs and self-care   Time 4   Period Weeks   Status Achieved           PT Long Term Goals - 06/07/15 1413    PT LONG TERM GOAL #1   Title be independent in advanced HEP   Time 8   Period Weeks   Status On-going   PT LONG TERM GOAL #2   Title reduce FOTO to < or = to 36% limitation   Time 8   Period Weeks   Status On-going   PT LONG TERM GOAL #3   Title demonstrate Lt shoulder AROM flexion to > or = to 120 degrees to allow for reaching overhead  145 as of March 9/ 2017   Time 8   Period Weeks   Status Achieved   PT LONG TERM GOAL #4   Title report < or = to 3/10 Lt shoulder pain with use    Time 8   Period Weeks   Status Achieved   PT LONG TERM GOAL #5   Title increase Lt shoulder strength to improve use of Lt UE to > or = to 75%    Time 8   Period Weeks   Status Partially Met               Plan - 06/07/15 1409    Clinical Impression Statement Pt with good tolerance with all exercises in PT. AROM left shoulder in degrees flexion 145, abduction 90, IR to left gluteal area. Pt will continue to benefit from skilled PT to improve strength    Pt will  benefit from skilled therapeutic  intervention in order to improve on the following deficits Postural dysfunction;Decreased strength;Impaired flexibility;Pain;Decreased activity tolerance;Decreased range of motion;Increased muscle spasms;Increased fascial restricitons   Rehab Potential Good   Clinical Impairments Affecting Rehab Potential None   PT Frequency 2x / week   PT Duration 8 weeks   PT Treatment/Interventions ADLs/Self Care Home Management;Cryotherapy;Moist Heat;Iontophoresis '4mg'$ /ml Dexamethasone;Functional mobility training;Therapeutic activities;Therapeutic exercise;Manual techniques;Patient/family education;Neuromuscular re-education;Passive range of motion   PT Next Visit Plan Continue with shoulder AROM, strength (gentle) manual, modalities PRN   PT Home Exercise Plan Current HEP   Consulted and Agree with Plan of Care Patient        Problem List Patient Active Problem List   Diagnosis Date Noted  . Genetic testing 05/23/2014  . History of breast cancer   . Family history of breast cancer   . Endometrial cancer ([place]) 03/21/2014  . Uterine cancer ([place]) 03/21/2014  . S/P mastectomy, bilateral 05/02/2013  . Situational stress 05/02/2013  . Osteoporosis, unspecified 05/02/2013  . Breast cancer, left breast ([place]) 05/02/2013    NAUMANN-HOUEGNIFIO,Minal Stuller PTA 06/07/2015, 2:52 PM  [facility] [address], [place], [place], [address] Phone: [phone]   Fax:  [phone]  Name: Beth Simon MRN: [id number] Date of Birth: May 17, 1940

## 2015-06-07 NOTE — Patient Instructions (Signed)
ROM: Towel Stretch - with Interior Rotation    Pull left arm up behind back by pulling towel up with other arm. Hold  20 seconds. Repeat 3 times per set.  Do 2-3____ sessions per day.  http://orth.exer.us/889   Copyright  VHI. All rights reserved.

## 2015-06-08 ENCOUNTER — Ambulatory Visit: Payer: Medicare Other | Admitting: Podiatry

## 2015-06-12 ENCOUNTER — Encounter: Payer: PPO | Admitting: Physical Therapy

## 2015-06-19 ENCOUNTER — Encounter: Payer: PPO | Admitting: Physical Therapy

## 2015-06-21 ENCOUNTER — Ambulatory Visit: Payer: PPO | Admitting: Physical Therapy

## 2015-06-21 ENCOUNTER — Encounter: Payer: Self-pay | Admitting: Physical Therapy

## 2015-06-21 DIAGNOSIS — M25612 Stiffness of left shoulder, not elsewhere classified: Secondary | ICD-10-CM

## 2015-06-21 DIAGNOSIS — M25512 Pain in left shoulder: Secondary | ICD-10-CM | POA: Diagnosis not present

## 2015-06-21 DIAGNOSIS — R29898 Other symptoms and signs involving the musculoskeletal system: Secondary | ICD-10-CM

## 2015-06-21 NOTE — Therapy (Signed)
[facility] [address], [place], [place], [address] Phone: [phone]   Fax:  [phone]  Physical Therapy Treatment  Patient Details  Name: Beth Simon MRN: [id number] Date of Birth: February 06, 1941 Referring Provider: Bevelyn Buckles, MD  Encounter Date: 06/21/2015      PT End of Session - 06/21/15 1417    Visit Number 9   Number of Visits 10   Date for PT Re-Evaluation 07/10/15   PT Start Time 9381   PT Stop Time 1445   PT Time Calculation (min) 43 min   Activity Tolerance Patient tolerated treatment well   Behavior During Therapy [facility] for tasks assessed/performed      Past Medical History  Diagnosis Date  . Osteoporosis     on Evista  . Cystitis     cytoxen, had once or twice  . Vasovagal syncopes   . Macular degeneration   . Anemia   . Uterine fibroid   . Complication of anesthesia   . PONV (postoperative nausea and vomiting)   . Breast cancer ([place]) 1990    bilateral mastectomy, adenoca breast-left MRM, reconstruction, chemo  . Bronchitis last 2 weeks    saw dr Felipa Eth 02-28-2014, he said no antibiotics needed, nonproductive cough  . Shoulder pain     taking physical therapy for last few weeks  . Neck pain     taking physictal therapy last 2 weeks  . History of breast cancer   . Uterine cancer ([place]) 03/21/2014    MLH1/PMS2 LOH  . Family history of breast cancer   . History of radiation therapy 2/10, 2/12, 2/18, 2/24, 05/29/14    vaginal cuff/ 30 Gy/ 5 fx    Past Surgical History  Procedure Laterality Date  . Excision vaginal cyst    . Breast implant removal  6/94    left breast  . Cataract extraction Bilateral   . Radical mastectomy lnd  1990    left, chemo done  . Breast reconstruction  1990    left  . Mastectomy Right 10/08/06    prophylactic  . Robotic assisted total hysterectomy with bilateral salpingo oopherectomy Bilateral 03/21/2014    Procedure: ROBOTIC ASSISTED TOTAL  HYSTERECTOMY WITH BILATERAL SALPINGO OOPHORECTOMY ;  Surgeon: Janie Morning, MD;  Location: [facility];  Service: Gynecology;  Laterality: Bilateral;    There were no vitals filed for this visit.  Visit Diagnosis:  Pain in joint of left shoulder  Shoulder weakness  Shoulder stiffness, left      Subjective Assessment - 06/21/15 1407    Subjective Pt is back from her trip to [place] due to funeral for her sister. Still uncomfortalbe with reaching behind back but improving.    Limitations Lifting   Diagnostic tests x-ray: negative for fracture, AC joint separation   Patient Stated Goals Reduce Lt shoulder pain, return to using Lt UT, return to driving   Currently in Pain? No/denies   Pain Onset More than a month ago                         [facility] Adult PT Treatment/Exercise - 06/21/15 0001    Posture/Postural Control   Posture/Postural Control Postural limitations   Postural Limitations Rounded Shoulders;Forward head   Exercises   Exercises Shoulder   Shoulder Exercises: Seated   Other Seated Exercises shoulder shrugs x 10   Shoulder Exercises: Sidelying   External Rotation Strengthening;Left;20 reps  1# added   ABduction  Strengthening;20 reps   Shoulder Exercises: Standing   Flexion AAROM;Left;10 reps   Flexion Limitations using finger ladder   ABduction AAROM;Left;10 reps   ABduction Limitations using finger ladder   Other Standing Exercises cone stack to 2nd shelf: 1x2 minutes, 1x 1 minute   Shoulder Exercises: Pulleys   Flexion 3 minutes   ABduction 3 minutes   Other Pulley Exercises IR x 2 min in standing   Shoulder Exercises: Stretch   Internal Rotation Stretch 20 seconds  with towel   Manual Therapy   Manual Therapy Passive ROM;Myofascial release;Soft tissue mobilization   Soft tissue mobilization to Lt pectoralis and Lt shoulder external rotators in Lt sidelying  with PROM into ABDuction   Myofascial Release --                  PT  Short Term Goals - 06/05/15 1418    PT SHORT TERM GOAL #1   Title be independent in initial HEP   Time 4   Period Weeks   Status Achieved   PT SHORT TERM GOAL #2   Title demonstrate Lt shoulder AROM flexion to > or = to 90 degrees to allow for reaching into microwave  140 degrees in sitting   Time 4   Period Weeks   Status Achieved   PT SHORT TERM GOAL #3   Title report < or = to 5/10 Lt shoulder pain with use    Time 4   Period Weeks   Status Achieved   PT SHORT TERM GOAL #4   Title wean from sling and report > or = to 50% use of Lt UE with ADLs and self-care   Time 4   Period Weeks   Status Achieved           PT Long Term Goals - 06/21/15 1421    PT LONG TERM GOAL #1   Title be independent in advanced HEP   Time 8   Period Weeks   Status On-going   PT LONG TERM GOAL #2   Title reduce FOTO to < or = to 36% limitation   Time 8   Period Weeks   Status On-going   PT LONG TERM GOAL #3   Title demonstrate Lt shoulder AROM flexion to > or = to 120 degrees to allow for reaching overhead   Time 8   Period Weeks   Status Achieved   PT LONG TERM GOAL #4   Title report < or = to 3/10 Lt shoulder pain with use    Time 8   Period Weeks   Status Achieved   PT LONG TERM GOAL #5   Title increase Lt shoulder strength to improve use of Lt UE to > or = to 75%    Time 8   Period Weeks   Status Partially Met               Plan - 06/21/15 1418    Clinical Impression Statement F   Pt will benefit from skilled therapeutic intervention in order to improve on the following deficits Postural dysfunction;Decreased strength;Impaired flexibility;Pain;Decreased activity tolerance;Decreased range of motion;Increased muscle spasms;Increased fascial restricitons   Rehab Potential Good   Clinical Impairments Affecting Rehab Potential None   PT Frequency 2x / week   PT Duration 8 weeks   PT Treatment/Interventions ADLs/Self Care Home Management;Cryotherapy;Moist Heat;Iontophoresis  '4mg'$ /ml Dexamethasone;Functional mobility training;Therapeutic activities;Therapeutic exercise;Manual techniques;Patient/family education;Neuromuscular re-education;Passive range of motion   PT Next Visit Plan G-codes and Foto, Continue  with shoulder AROM, strength (gentle) manual, modalities PRN   PT Home Exercise Plan Current HEP   Consulted and Agree with Plan of Care Patient        Problem List Patient Active Problem List   Diagnosis Date Noted  . Genetic testing 05/23/2014  . History of breast cancer   . Family history of breast cancer   . Endometrial cancer ([place]) 03/21/2014  . Uterine cancer ([place]) 03/21/2014  . S/P mastectomy, bilateral 05/02/2013  . Situational stress 05/02/2013  . Osteoporosis, unspecified 05/02/2013  . Breast cancer, left breast ([place]) 05/02/2013    NAUMANN-HOUEGNIFIO,Lachlyn Vanderstelt PTA 06/21/2015, 2:53 PM  [facility] [address] [place], [place], [address] Phone: [phone]   Fax:  [phone]  Name: Beth Simon MRN: [id number] Date of Birth: 1940/10/31

## 2015-06-26 ENCOUNTER — Ambulatory Visit: Payer: PPO

## 2015-06-26 DIAGNOSIS — M25512 Pain in left shoulder: Secondary | ICD-10-CM | POA: Diagnosis not present

## 2015-06-26 DIAGNOSIS — M25612 Stiffness of left shoulder, not elsewhere classified: Secondary | ICD-10-CM

## 2015-06-26 DIAGNOSIS — R29898 Other symptoms and signs involving the musculoskeletal system: Secondary | ICD-10-CM

## 2015-06-26 NOTE — Therapy (Signed)
[facility] [address] [place], [place], [address] Phone: [phone]   Fax:  [phone]  Physical Therapy Treatment  Patient Details  Name: Beth Simon MRN: [id number] Date of Birth: 24-Nov-1940 Referring Provider: Maudry Mayhew, MD  Encounter Date: 06/26/2015      PT End of Session - 06/26/15 1441    Visit Number 10   Number of Visits 20   Date for PT Re-Evaluation 07/10/15   PT Start Time 1400   PT Stop Time 1441   PT Time Calculation (min) 41 min   Activity Tolerance Patient tolerated treatment well   Behavior During Therapy [facility] for tasks assessed/performed      Past Medical History  Diagnosis Date  . Osteoporosis     on Evista  . Cystitis     cytoxen, had once or twice  . Vasovagal syncopes   . Macular degeneration   . Anemia   . Uterine fibroid   . Complication of anesthesia   . PONV (postoperative nausea and vomiting)   . Breast cancer ([facility]) 1990    bilateral mastectomy, adenoca breast-left MRM, reconstruction, chemo  . Bronchitis last 2 weeks    saw dr Pete Glatter 02-28-2014, he said no antibiotics needed, nonproductive cough  . Shoulder pain     taking physical therapy for last few weeks  . Neck pain     taking physictal therapy last 2 weeks  . History of breast cancer   . Uterine cancer ([facility]) 03/21/2014    MLH1/PMS2 LOH  . Family history of breast cancer   . History of radiation therapy 2/10, 2/12, 2/18, 2/24, 05/29/14    vaginal cuff/ 30 Gy/ 5 fx    Past Surgical History  Procedure Laterality Date  . Excision vaginal cyst    . Breast implant removal  6/94    left breast  . Cataract extraction Bilateral   . Radical mastectomy lnd  1990    left, chemo done  . Breast reconstruction  1990    left  . Mastectomy Right 10/08/06    prophylactic  . Robotic assisted total hysterectomy with bilateral salpingo oopherectomy Bilateral 03/21/2014    Procedure: ROBOTIC ASSISTED TOTAL  HYSTERECTOMY WITH BILATERAL SALPINGO OOPHORECTOMY ;  Surgeon: Laurette Schimke, MD;  Location: [facility];  Service: Gynecology;  Laterality: Bilateral;    There were no vitals filed for this visit.  Visit Diagnosis:  Pain in joint of left shoulder  Shoulder weakness  Shoulder stiffness, left          [facility] PT Assessment - 06/26/15 0001    Assessment   Medical Diagnosis Lt shoulder AC injury with possible preexisting cuff   Onset Date/Surgical Date 05/01/15   Observation/Other Assessments   Focus on Therapeutic Outcomes (FOTO)  37% limitation                     [facility] Adult PT Treatment/Exercise - 06/26/15 0001    Shoulder Exercises: Seated   Other Seated Exercises shoulder shrugs x 10   Shoulder Exercises: Sidelying   External Rotation Strengthening;Left;20 reps  1# added   ABduction Strengthening;20 reps   Shoulder Exercises: Standing   Flexion AAROM;Left;10 reps   Flexion Limitations using finger ladder   ABduction AAROM;Left;10 reps   ABduction Limitations using finger ladder   Other Standing Exercises cone stack to 2nd shelf: 1x2 minutes, 1x 1 minute   Shoulder Exercises: Pulleys   Flexion 3 minutes   ABduction 3 minutes  Other Pulley Exercises IR x 2 min in standing   Shoulder Exercises: Stretch   Internal Rotation Stretch 20 seconds  with towel   Manual Therapy   Manual Therapy Passive ROM;Myofascial release;Soft tissue mobilization   Soft tissue mobilization to Lt pectoralis and Lt shoulder external rotators in Lt sidelying  with PROM into ABDuction                  PT Short Term Goals - 06/05/15 1418    PT SHORT TERM GOAL #1   Title be independent in initial HEP   Time 4   Period Weeks   Status Achieved   PT SHORT TERM GOAL #2   Title demonstrate Lt shoulder AROM flexion to > or = to 90 degrees to allow for reaching into microwave  140 degrees in sitting   Time 4   Period Weeks   Status Achieved   PT SHORT TERM GOAL #3   Title  report < or = to 5/10 Lt shoulder pain with use    Time 4   Period Weeks   Status Achieved   PT SHORT TERM GOAL #4   Title wean from sling and report > or = to 50% use of Lt UE with ADLs and self-care   Time 4   Period Weeks   Status Achieved           PT Long Term Goals - 2015/07/06 1410    PT LONG TERM GOAL #2   Title reduce FOTO to < or = to 36% limitation   Time 8   Period Weeks   Status On-going  37% limitation   PT LONG TERM GOAL #4   Title report < or = to 3/10 Lt shoulder pain with use    Status Achieved   PT LONG TERM GOAL #5   Title increase Lt shoulder strength to improve use of Lt UE to > or = to 75%    Status Achieved               Plan - Jul 06, 2015 1410    Clinical Impression Statement Pt reports 85-90% use of Lt UE with use and 2/10 Lt shoulder pain with use.  Pt reports continued difficulty with sleeping on the Lt side and reaching back with Lt UE to put on her seatbelt.  FOTO score is 37% limitation (improved from 61% at evaluation). Pt with improved AROM but still limited at times due to pain with use. Progress with exercise has been slow as pt had a flare-up after exercise during first few sessions and has asked for slow progression of exercise. Pt will benefit from skilled PT for LT shoulder AROM, strength and endurance progression to allow for return to normal use without pain.     Pt will benefit from skilled therapeutic intervention in order to improve on the following deficits Postural dysfunction;Decreased strength;Impaired flexibility;Pain;Decreased activity tolerance;Decreased range of motion;Increased muscle spasms;Increased fascial restricitons   Rehab Potential Good   PT Frequency 2x / week   PT Duration 8 weeks   PT Treatment/Interventions ADLs/Self Care Home Management;Cryotherapy;Moist Heat;Iontophoresis '4mg'$ /ml Dexamethasone;Functional mobility training;Therapeutic activities;Therapeutic exercise;Manual techniques;Patient/family  education;Neuromuscular re-education;Passive range of motion   PT Next Visit Plan Continue with shoulder AROM, strength (gentle) manual, modalities PRN.  Measure Lt shoulder AROM   Consulted and Agree with Plan of Care Patient          G-Codes - 07-06-15 1406    Functional Assessment Tool Used FOTO: 37% limitation  Functional Limitation Other PT primary   Other PT Primary Current Status (V4715) At least 20 percent but less than 40 percent impaired, limited or restricted   Other PT Primary Goal Status (N5396) At least 20 percent but less than 40 percent impaired, limited or restricted      Problem List Patient Active Problem List   Diagnosis Date Noted  . Genetic testing 05/23/2014  . History of breast cancer   . Family history of breast cancer   . Endometrial cancer ([place]) 03/21/2014  . Uterine cancer ([place]) 03/21/2014  . S/P mastectomy, bilateral 05/02/2013  . Situational stress 05/02/2013  . Osteoporosis, unspecified 05/02/2013  . Breast cancer, left breast ([place]) 05/02/2013   Sigurd Sos, PT 06/26/2015 2:43 PM  [facility] [address], [place], [place], [address] Phone: [phone]   Fax:  [phone]  Name: CHANTALE LEUGERS MRN: [id number] Date of Birth: 1940-06-07

## 2015-06-28 ENCOUNTER — Encounter: Payer: Self-pay | Admitting: Physical Therapy

## 2015-06-28 ENCOUNTER — Ambulatory Visit: Payer: PPO | Admitting: Physical Therapy

## 2015-06-28 DIAGNOSIS — R29898 Other symptoms and signs involving the musculoskeletal system: Secondary | ICD-10-CM

## 2015-06-28 DIAGNOSIS — M25512 Pain in left shoulder: Secondary | ICD-10-CM

## 2015-06-28 DIAGNOSIS — M25612 Stiffness of left shoulder, not elsewhere classified: Secondary | ICD-10-CM

## 2015-06-28 NOTE — Therapy (Signed)
[facility] [address], [place], [place], [address] Phone: [phone]   Fax:  [phone]  Physical Therapy Treatment  Patient Details  Name: Beth Simon MRN: [id number] Date of Birth: 1940-05-27 Referring Provider: Bevelyn Buckles, MD  Encounter Date: 06/28/2015      PT End of Session - 06/28/15 1413    Visit Number 11   Number of Visits 20   Date for PT Re-Evaluation 07/10/15   PT Start Time 1400   PT Stop Time 1445   PT Time Calculation (min) 45 min   Activity Tolerance Patient tolerated treatment well   Behavior During Therapy [facility] for tasks assessed/performed      Past Medical History  Diagnosis Date  . Osteoporosis     on Evista  . Cystitis     cytoxen, had once or twice  . Vasovagal syncopes   . Macular degeneration   . Anemia   . Uterine fibroid   . Complication of anesthesia   . PONV (postoperative nausea and vomiting)   . Breast cancer ([place]) 1990    bilateral mastectomy, adenoca breast-left MRM, reconstruction, chemo  . Bronchitis last 2 weeks    saw dr Felipa Eth 02-28-2014, he said no antibiotics needed, nonproductive cough  . Shoulder pain     taking physical therapy for last few weeks  . Neck pain     taking physictal therapy last 2 weeks  . History of breast cancer   . Uterine cancer ([place]) 03/21/2014    MLH1/PMS2 LOH  . Family history of breast cancer   . History of radiation therapy 2/10, 2/12, 2/18, 2/24, 05/29/14    vaginal cuff/ 30 Gy/ 5 fx    Past Surgical History  Procedure Laterality Date  . Excision vaginal cyst    . Breast implant removal  6/94    left breast  . Cataract extraction Bilateral   . Radical mastectomy lnd  1990    left, chemo done  . Breast reconstruction  1990    left  . Mastectomy Right 10/08/06    prophylactic  . Robotic assisted total hysterectomy with bilateral salpingo oopherectomy Bilateral 03/21/2014    Procedure: ROBOTIC ASSISTED TOTAL  HYSTERECTOMY WITH BILATERAL SALPINGO OOPHORECTOMY ;  Surgeon: Janie Morning, MD;  Location: [facility];  Service: Gynecology;  Laterality: Bilateral;    There were no vitals filed for this visit.  Visit Diagnosis:  Pain in joint of left shoulder  Shoulder weakness  Shoulder stiffness, left      Subjective Assessment - 06/28/15 1412    Subjective Pt still concerned about limited mobility with IR, as noticed with reaching behind back.    Limitations Lifting   Diagnostic tests x-ray: negative for fracture, AC joint separation   Patient Stated Goals Reduce Lt shoulder pain, return to using Lt UT, return to driving   Currently in Pain? No/denies                         [facility] Adult PT Treatment/Exercise - 06/28/15 0001    Posture/Postural Control   Posture/Postural Control Postural limitations   Postural Limitations Rounded Shoulders;Forward head   Exercises   Exercises Shoulder   Shoulder Exercises: Seated   Other Seated Exercises shoulder shrugs x 10   Shoulder Exercises: Sidelying   External Rotation Strengthening;Left;20 reps  1# added   ABduction Strengthening;20 reps  1# added   Shoulder Exercises: Standing   Flexion AAROM;Left;10 reps  Flexion Limitations using finger ladder   ABduction AAROM;Left;10 reps   ABduction Limitations using finger ladder   Other Standing Exercises cone stack to 2nd shelf: 1x2 minutes, 1x 1 minute   Shoulder Exercises: Pulleys   Flexion 3 minutes   Other Pulley Exercises IR x 3 min in standing   Shoulder Exercises: Stretch   Internal Rotation Stretch 3 reps  20 sec hold using towel   Manual Therapy   Manual Therapy Passive ROM;Myofascial release;Soft tissue mobilization   Soft tissue mobilization to Lt pectoralis and Lt shoulder external rotators in Lt sidelying  with PROM into Abd   Myofascial Release to Lt pectoralis                  PT Short Term Goals - 06/05/15 1418    PT SHORT TERM GOAL #1   Title be  independent in initial HEP   Time 4   Period Weeks   Status Achieved   PT SHORT TERM GOAL #2   Title demonstrate Lt shoulder AROM flexion to > or = to 90 degrees to allow for reaching into microwave  140 degrees in sitting   Time 4   Period Weeks   Status Achieved   PT SHORT TERM GOAL #3   Title report < or = to 5/10 Lt shoulder pain with use    Time 4   Period Weeks   Status Achieved   PT SHORT TERM GOAL #4   Title wean from sling and report > or = to 50% use of Lt UE with ADLs and self-care   Time 4   Period Weeks   Status Achieved           PT Long Term Goals - 06/28/15 1417    PT LONG TERM GOAL #1   Title be independent in advanced HEP   Time 8   Period Weeks   Status On-going   PT LONG TERM GOAL #2   Title reduce FOTO to < or = to 36% limitation   Time 8   Period Weeks   Status On-going   PT LONG TERM GOAL #3   Title demonstrate Lt shoulder AROM flexion to > or = to 120 degrees to allow for reaching overhead   Time 8   Period Weeks   Status Achieved   PT LONG TERM GOAL #5   Title increase Lt shoulder strength to improve use of Lt UE to > or = to 75%    Time 8   Period Weeks   Status Achieved               Plan - 06/28/15 1414    Clinical Impression Statement Pt with limited ROM into IR what makes reaching behind back as reaching for seat belt difficult.   Pt will benefit from skilled therapeutic intervention in order to improve on the following deficits Postural dysfunction;Decreased strength;Impaired flexibility;Pain;Decreased activity tolerance;Decreased range of motion;Increased muscle spasms;Increased fascial restricitons   Rehab Potential Good   Clinical Impairments Affecting Rehab Potential None   PT Frequency 2x / week   PT Duration 8 weeks   PT Treatment/Interventions ADLs/Self Care Home Management;Cryotherapy;Moist Heat;Iontophoresis '4mg'$ /ml Dexamethasone;Functional mobility training;Therapeutic activities;Therapeutic exercise;Manual  techniques;Patient/family education;Neuromuscular re-education;Passive range of motion   PT Next Visit Plan Continue with shoulder AROM, strength (gentle) manual, modalities PRN.  Measure Lt shoulder AROM   PT Home Exercise Plan Current HEP   Consulted and Agree with Plan of Care Patient  Problem List Patient Active Problem List   Diagnosis Date Noted  . Genetic testing 05/23/2014  . History of breast cancer   . Family history of breast cancer   . Endometrial cancer ([place]) 03/21/2014  . Uterine cancer ([place]) 03/21/2014  . S/P mastectomy, bilateral 05/02/2013  . Situational stress 05/02/2013  . Osteoporosis, unspecified 05/02/2013  . Breast cancer, left breast ([place]) 05/02/2013    NAUMANN-HOUEGNIFIO,Adelai Achey PTA 06/28/2015, 2:45 PM  [facility] [address] [place], [place], [address] Phone: [phone]   Fax:  [phone]  Name: TEMPERENCE ZENOR MRN: [id number] Date of Birth: 1940/06/20

## 2015-06-29 ENCOUNTER — Ambulatory Visit (INDEPENDENT_AMBULATORY_CARE_PROVIDER_SITE_OTHER): Payer: PPO | Admitting: Podiatry

## 2015-06-29 ENCOUNTER — Encounter: Payer: Self-pay | Admitting: Podiatry

## 2015-06-29 DIAGNOSIS — R52 Pain, unspecified: Secondary | ICD-10-CM

## 2015-06-29 DIAGNOSIS — B351 Tinea unguium: Secondary | ICD-10-CM | POA: Diagnosis not present

## 2015-06-29 DIAGNOSIS — L84 Corns and callosities: Secondary | ICD-10-CM

## 2015-06-29 NOTE — Progress Notes (Signed)
Patient ID: Beth Simon, female   DOB: Mar 27, 1941, 75 y.o.   MRN: [id number]  Subjective: 75 y.o. returns the office today for painful, elongated, thickened toenails which she is unable to trim herself. Denies any redness or drainage around the nails. She also has a callus the end of her left second toe which is painful. Denies any swelling redness or drainage. Denies any acute changes since last appointment and no new complaints today. Denies any systemic complaints such as fevers, chills, nausea, vomiting.   Objective: AAO 3, NAD DP/PT pulses palpable, CRT less than 3 seconds Nails hypertrophic, dystrophic, elongated, brittle, discolored 10. There is tenderness overlying the nails 1-5 bilaterally. There is no surrounding erythema or drainage along the nail sites. Hyperkeratotic lesions of the distal aspect left second digit. Upon debridement there is no underlying ulceration, drainage or other signs of infection. No open lesions or pre-ulcerative lesions are identified. No other areas of tenderness bilateral lower extremities. No overlying edema, erythema, increased warmth. Hammertoe contractures bilaerally Bilateral lower extremity edema around the ankles. No pain with calf compression, swelling, warmth, erythema. Exam appears to be mostly unchanged  Assessment: Patient presents with symptomatic onychomycosis; calluses   Plan: -Treatment options including alternatives, risks, complications were discussed -Nails sharply debrided 10 without complication/bleeding. -Hyperkeratotic lesion sharply debrided 1 without complication/bleeding. -Discussed daily foot inspection. If there are any changes, to call the office immediately.  -Follow-up in 3 months or sooner if any problems are to arise. In the meantime, encouraged to call the office with any questions, concerns, changes symptoms.  Celesta Gentile, DPM

## 2015-07-03 ENCOUNTER — Encounter: Payer: Self-pay | Admitting: Physical Therapy

## 2015-07-03 ENCOUNTER — Ambulatory Visit: Payer: PPO | Attending: Gynecologic Oncology | Admitting: Physical Therapy

## 2015-07-03 DIAGNOSIS — M25512 Pain in left shoulder: Secondary | ICD-10-CM | POA: Diagnosis not present

## 2015-07-03 DIAGNOSIS — M6281 Muscle weakness (generalized): Secondary | ICD-10-CM | POA: Diagnosis not present

## 2015-07-03 DIAGNOSIS — M25612 Stiffness of left shoulder, not elsewhere classified: Secondary | ICD-10-CM | POA: Diagnosis not present

## 2015-07-03 DIAGNOSIS — R29898 Other symptoms and signs involving the musculoskeletal system: Secondary | ICD-10-CM | POA: Diagnosis not present

## 2015-07-03 NOTE — Therapy (Addendum)
[facility] [address] [place], [place], [address] Phone: [phone]   Fax:  [phone]  Physical Therapy Treatment  Patient Details  Name: Beth Simon MRN: [id number] Date of Birth: 1940/08/28 Referring Provider: Bevelyn Buckles, MD  Encounter Date: 07/03/2015      PT End of Session - 07/03/15 1422    Visit Number 12   Number of Visits 20   Date for PT Re-Evaluation 07/10/15   PT Start Time 2409   PT Stop Time 1445   PT Time Calculation (min) 40 min   Activity Tolerance Patient tolerated treatment well   Behavior During Therapy [facility] for tasks assessed/performed      Past Medical History  Diagnosis Date  . Osteoporosis     on Evista  . Cystitis     cytoxen, had once or twice  . Vasovagal syncopes   . Macular degeneration   . Anemia   . Uterine fibroid   . Complication of anesthesia   . PONV (postoperative nausea and vomiting)   . Breast cancer ([place]) 1990    bilateral mastectomy, adenoca breast-left MRM, reconstruction, chemo  . Bronchitis last 2 weeks    saw dr Felipa Eth 02-28-2014, he said no antibiotics needed, nonproductive cough  . Shoulder pain     taking physical therapy for last few weeks  . Neck pain     taking physictal therapy last 2 weeks  . History of breast cancer   . Uterine cancer ([place]) 03/21/2014    MLH1/PMS2 LOH  . Family history of breast cancer   . History of radiation therapy 2/10, 2/12, 2/18, 2/24, 05/29/14    vaginal cuff/ 30 Gy/ 5 fx    Past Surgical History  Procedure Laterality Date  . Excision vaginal cyst    . Breast implant removal  6/94    left breast  . Cataract extraction Bilateral   . Radical mastectomy lnd  1990    left, chemo done  . Breast reconstruction  1990    left  . Mastectomy Right 10/08/06    prophylactic  . Robotic assisted total hysterectomy with bilateral salpingo oopherectomy Bilateral 03/21/2014    Procedure: ROBOTIC ASSISTED TOTAL  HYSTERECTOMY WITH BILATERAL SALPINGO OOPHORECTOMY ;  Surgeon: Janie Morning, MD;  Location: [facility];  Service: Gynecology;  Laterality: Bilateral;    There were no vitals filed for this visit.  Visit Diagnosis:  Pain in joint of left shoulder  Shoulder weakness  Shoulder stiffness, left      Subjective Assessment - 07/03/15 1411    Subjective Pt still limited with IR Left UE, she reports she is now able to lay on it for a little while. Reaching belt in car is getting easier   Limitations Lifting   Diagnostic tests x-ray: negative for fracture, AC joint separation   Patient Stated Goals Reduce Lt shoulder pain, return to using Lt UT, return to driving   Currently in Pain? No/denies                         [facility] Adult PT Treatment/Exercise - 07/03/15 0001    Posture/Postural Control   Posture/Postural Control Postural limitations   Postural Limitations Rounded Shoulders;Forward head   Exercises   Exercises Shoulder   Shoulder Exercises: Seated   Other Seated Exercises shoulder shrugs x 10   Shoulder Exercises: Sidelying   External Rotation Strengthening;Left;20 reps  1# added   ABduction Strengthening;20 reps  1#  added   Shoulder Exercises: Standing   Flexion AAROM;Left;10 reps   Shoulder Flexion Weight (lbs) --  13 added   Flexion Limitations using finger ladder   ABduction AAROM;Left;10 reps   ABduction Limitations using finger ladder   Extension Weight (lbs) 1# added   Other Standing Exercises cone stack to 2nd shelf: 1x2 minutes, 1x 1 minute   Shoulder Exercises: Pulleys   Flexion 3 minutes   Other Pulley Exercises IR x 2 min in standing  in standing   Shoulder Exercises: Stretch   Internal Rotation Stretch 3 reps  20sec hold using towel   Manual Therapy   Manual Therapy Passive ROM;Myofascial release;Soft tissue mobilization   Soft tissue mobilization to Lt pectoralis and Lt shoulder external rotators in Lt sidelying   Myofascial Release to Lt  pectoralis                  PT Short Term Goals - 06/05/15 1418    PT SHORT TERM GOAL #1   Title be independent in initial HEP   Time 4   Period Weeks   Status Achieved   PT SHORT TERM GOAL #2   Title demonstrate Lt shoulder AROM flexion to > or = to 90 degrees to allow for reaching into microwave  140 degrees in sitting   Time 4   Period Weeks   Status Achieved   PT SHORT TERM GOAL #3   Title report < or = to 5/10 Lt shoulder pain with use    Time 4   Period Weeks   Status Achieved   PT SHORT TERM GOAL #4   Title wean from sling and report > or = to 50% use of Lt UE with ADLs and self-care   Time 4   Period Weeks   Status Achieved           PT Long Term Goals - 07/03/15 1429    PT LONG TERM GOAL #1   Title be independent in advanced HEP   Time 8   Period Weeks   Status On-going   PT LONG TERM GOAL #2   Title reduce FOTO to < or = to 36% limitation   Time 8   Period Weeks   Status On-going   PT LONG TERM GOAL #3   Title demonstrate Lt shoulder AROM flexion to > or = to 120 degrees to allow for reaching overhead   Time 8   Period Weeks   Status Achieved   PT LONG TERM GOAL #4   Title report < or = to 3/10 Lt shoulder pain with use    Time 8   Period Weeks   Status Achieved   PT LONG TERM GOAL #5   Title increase Lt shoulder strength to improve use of Lt UE to > or = to 75%    Time 8   Period Weeks   Status Achieved      Plan - 07/10/15 1433    Clinical Impression Statement Patient has increased ROM for left shoulder  to full for flexion, abduction but limited to 55 degrees for ER.  Lef t  shoulder strength is 4+/5.  Patient pain is 2/10 when she lays on left  shoulder, reach behind her back. and reach for seatbelt.  FOTO score  improved to 37% limitation.  Patient is ready for discharge.      Rehab Potential Good   Clinical Impairments Affecting Rehab Potential None   PT Frequency 2x / week  PT Duration 8 weeks   PT  Treatment/Interventions ADLs/Self Care Home  Management;Cryotherapy;Moist Heat;Iontophoresis '4mg'$ /ml  Dexamethasone;Functional mobility training;Therapeutic  activities;Therapeutic exercise;Manual techniques;Patient/family  education;Neuromuscular re-education;Passive range of motion   PT Next Visit Plan Discharge to HEP   PT Home Exercise Plan Current HEP   Consulted and Agree with Plan of Care Patient     Patient will benefit from skilled therapeutic intervention in order to improve the following deficits and impairments:  Postural dysfunction, Decreased strength, Impaired flexibility, Pain, Decreased activity tolerance, Decreased range of motion, Increased muscle spasms, Increased fascial restricitons  Visit Diagnosis: Pain in joint of left shoulder  (primary encounter diagnosis)  Shoulder weakness  Shoulder stiffness, left          Problem List Patient Active Problem List   Diagnosis Date Noted  . Genetic testing 05/23/2014  . History of breast cancer   . Family history of breast cancer   . Endometrial cancer ([place]) 03/21/2014  . Uterine cancer ([place]) 03/21/2014  . S/P mastectomy, bilateral 05/02/2013  . Situational stress 05/02/2013  . Osteoporosis, unspecified 05/02/2013  . Breast cancer, left breast ([place]) 05/02/2013    NAUMANN-HOUEGNIFIO,Keshawn Fiorito PTA 07/03/2015, 2:45 PM  [facility] [address], [place], [place], [address] Phone: [phone]   Fax:  [phone]  Name: Beth Simon MRN: [id number] Date of Birth: 1940/08/01 Earlie Counts, PT 07/10/2015 5:20 PM

## 2015-07-05 ENCOUNTER — Ambulatory Visit: Payer: PPO | Admitting: Physical Therapy

## 2015-07-05 DIAGNOSIS — M25612 Stiffness of left shoulder, not elsewhere classified: Secondary | ICD-10-CM

## 2015-07-05 DIAGNOSIS — M25512 Pain in left shoulder: Secondary | ICD-10-CM | POA: Diagnosis not present

## 2015-07-05 DIAGNOSIS — M6281 Muscle weakness (generalized): Secondary | ICD-10-CM

## 2015-07-05 NOTE — Therapy (Signed)
[facility] [address], [place], [place], [address] Phone: [phone]   Fax:  [phone]  Physical Therapy Treatment  Patient Details  Name: Beth Simon MRN: [id number] Date of Birth: 08/10/1940 Referring Provider: Bevelyn Buckles, MD  Encounter Date: 07/05/2015      PT End of Session - 07/05/15 1414    Visit Number 13   Number of Visits 20   Date for PT Re-Evaluation 07/10/15   PT Start Time 1404   PT Stop Time 1445   PT Time Calculation (min) 41 min   Activity Tolerance Patient tolerated treatment well   Behavior During Therapy [facility] for tasks assessed/performed      Past Medical History  Diagnosis Date  . Osteoporosis     on Evista  . Cystitis     cytoxen, had once or twice  . Vasovagal syncopes   . Macular degeneration   . Anemia   . Uterine fibroid   . Complication of anesthesia   . PONV (postoperative nausea and vomiting)   . Breast cancer ([place]) 1990    bilateral mastectomy, adenoca breast-left MRM, reconstruction, chemo  . Bronchitis last 2 weeks    saw dr Felipa Eth 02-28-2014, he said no antibiotics needed, nonproductive cough  . Shoulder pain     taking physical therapy for last few weeks  . Neck pain     taking physictal therapy last 2 weeks  . History of breast cancer   . Uterine cancer ([place]) 03/21/2014    MLH1/PMS2 LOH  . Family history of breast cancer   . History of radiation therapy 2/10, 2/12, 2/18, 2/24, 05/29/14    vaginal cuff/ 30 Gy/ 5 fx    Past Surgical History  Procedure Laterality Date  . Excision vaginal cyst    . Breast implant removal  6/94    left breast  . Cataract extraction Bilateral   . Radical mastectomy lnd  1990    left, chemo done  . Breast reconstruction  1990    left  . Mastectomy Right 10/08/06    prophylactic  . Robotic assisted total hysterectomy with bilateral salpingo oopherectomy Bilateral 03/21/2014    Procedure: ROBOTIC ASSISTED TOTAL  HYSTERECTOMY WITH BILATERAL SALPINGO OOPHORECTOMY ;  Surgeon: Janie Morning, MD;  [facility]: [facility];  Service: Gynecology;  Laterality: Bilateral;    There were no vitals filed for this visit.  Visit Diagnosis:  Pain in joint of left shoulder  Shoulder weakness  Shoulder stiffness, left      Subjective Assessment - 07/05/15 1411    Subjective Pt still limited with IR left UE, reaching belt in car is getting easier.   Limitations Lifting   Diagnostic tests x-ray: negative for fracture, AC joint separation   Patient Stated Goals Reduce Lt shoulder pain, return to using Lt UT, return to driving   Currently in Pain? No/denies                         [facility] Adult PT Treatment/Exercise - 07/05/15 0001    Posture/Postural Control   Posture/Postural Control Postural limitations   Postural Limitations Rounded Shoulders;Forward head   Exercises   Exercises Shoulder   Shoulder Exercises: Seated   Other Seated Exercises shoulder shrugs x 10   Shoulder Exercises: Sidelying   External Rotation Strengthening;Left;20 reps  2#   ABduction Strengthening;20 reps  2# added   Shoulder Exercises: Standing   Internal Rotation AROM;20 reps  using cane  behind back   Flexion Limitations using finger ladder   ABduction AAROM;Left;10 reps   Shoulder ABduction Weight (lbs) 1.5#   ABduction Limitations using finger ladder   Extension Weight (lbs) 1.5# added   Other Standing Exercises cone stack to 2nd shelf 2x 1 minutes with 1.5#   Shoulder Exercises: Pulleys   Flexion 3 minutes   Other Pulley Exercises IR x 2 min in standing   Shoulder Exercises: Stretch   Internal Rotation Stretch 3 reps  with towel in standing   Manual Therapy   Manual Therapy Passive ROM;Myofascial release;Soft tissue mobilization   Soft tissue mobilization to Lt pectoralis and Lt shoulder external rotators in Lt sidelying   Myofascial Release to Lt pectoralis                  PT Short Term  Goals - 06/05/15 1418    PT SHORT TERM GOAL #1   Title be independent in initial HEP   Time 4   Period Weeks   Status Achieved   PT SHORT TERM GOAL #2   Title demonstrate Lt shoulder AROM flexion to > or = to 90 degrees to allow for reaching into microwave  140 degrees in sitting   Time 4   Period Weeks   Status Achieved   PT SHORT TERM GOAL #3   Title report < or = to 5/10 Lt shoulder pain with use    Time 4   Period Weeks   Status Achieved   PT SHORT TERM GOAL #4   Title wean from sling and report > or = to 50% use of Lt UE with ADLs and self-care   Time 4   Period Weeks   Status Achieved           PT Long Term Goals - 07/03/15 1429    PT LONG TERM GOAL #1   Title be independent in advanced HEP   Time 8   Period Weeks   Status On-going   PT LONG TERM GOAL #2   Title reduce FOTO to < or = to 36% limitation   Time 8   Period Weeks   Status On-going   PT LONG TERM GOAL #3   Title demonstrate Lt shoulder AROM flexion to > or = to 120 degrees to allow for reaching overhead   Time 8   Period Weeks   Status Achieved   PT LONG TERM GOAL #4   Title report < or = to 3/10 Lt shoulder pain with use    Time 8   Period Weeks   Status Achieved   PT LONG TERM GOAL #5   Title increase Lt shoulder strength to improve use of Lt UE to > or = to 75%    Time 8   Period Weeks   Status Achieved               Plan - 07/05/15 1415    Clinical Impression Statement Pt continues to gain ROM and strength and is progressing toward goals. Pt will continue to benefit from skilled PT.   Pt will benefit from skilled therapeutic intervention in order to improve on the following deficits Postural dysfunction;Decreased strength;Impaired flexibility;Pain;Decreased activity tolerance;Decreased range of motion;Increased muscle spasms;Increased fascial restricitons   Rehab Potential Good   Clinical Impairments Affecting Rehab Potential None   PT Frequency 2x / week   PT Duration 8 weeks    PT Treatment/Interventions ADLs/Self Care Home Management;Cryotherapy;Moist Heat;Iontophoresis 20m/ml Dexamethasone;Functional mobility training;Therapeutic activities;Therapeutic exercise;Manual  techniques;Patient/family education;Neuromuscular re-education;Passive range of motion   PT Next Visit Plan Continue with shoulder AROM, strength (gentle) manual, modalities PRN.     PT Home Exercise Plan Current HEP   Consulted and Agree with Plan of Care Patient        Problem List Patient Active Problem List   Diagnosis Date Noted  . Genetic testing 05/23/2014  . History of breast cancer   . Family history of breast cancer   . Endometrial cancer ([place]) 03/21/2014  . Uterine cancer ([place]) 03/21/2014  . S/P mastectomy, bilateral 05/02/2013  . Situational stress 05/02/2013  . Osteoporosis, unspecified 05/02/2013  . Breast cancer, left breast ([place]) 05/02/2013    NAUMANN-HOUEGNIFIO,Jeromey Kruer PTA 07/05/2015, 2:30 PM  [facility] [address] [place], [place], [address] Phone: [phone]   Fax:  [phone]  Name: SINCERITY CEDAR MRN: [id number] Date of Birth: October 26, 1940

## 2015-07-10 ENCOUNTER — Encounter: Payer: Self-pay | Admitting: Physical Therapy

## 2015-07-10 ENCOUNTER — Ambulatory Visit: Payer: PPO | Admitting: Physical Therapy

## 2015-07-10 DIAGNOSIS — M25512 Pain in left shoulder: Secondary | ICD-10-CM | POA: Diagnosis not present

## 2015-07-10 DIAGNOSIS — M6281 Muscle weakness (generalized): Secondary | ICD-10-CM

## 2015-07-10 DIAGNOSIS — M25612 Stiffness of left shoulder, not elsewhere classified: Secondary | ICD-10-CM

## 2015-07-10 NOTE — Addendum Note (Signed)
Addended by: Earlie Counts F on: 07/10/2015 05:20 PM   Modules accepted: Orders

## 2015-07-10 NOTE — Therapy (Signed)
[facility] [address], [place], [place], [address] Phone: [phone]   Fax:  [phone]  Physical Therapy Treatment  Patient Details  Name: Beth Simon MRN: [id number] Date of Birth: 1941/02/14 Referring Provider: Dr. Bevelyn Buckles  Encounter Date: 07/10/2015      PT End of Session - 07/10/15 1440    Visit Number 14   Date for PT Re-Evaluation 07/10/15   PT Start Time 1400   PT Stop Time 1445   PT Time Calculation (min) 45 min   Activity Tolerance Patient tolerated treatment well   Behavior During Therapy [facility] for tasks assessed/performed      Past Medical History  Diagnosis Date  . Osteoporosis     on Evista  . Cystitis     cytoxen, had once or twice  . Vasovagal syncopes   . Macular degeneration   . Anemia   . Uterine fibroid   . Complication of anesthesia   . PONV (postoperative nausea and vomiting)   . Breast cancer ([place]) 1990    bilateral mastectomy, adenoca breast-left MRM, reconstruction, chemo  . Bronchitis last 2 weeks    saw dr Felipa Eth 02-28-2014, he said no antibiotics needed, nonproductive cough  . Shoulder pain     taking physical therapy for last few weeks  . Neck pain     taking physictal therapy last 2 weeks  . History of breast cancer   . Uterine cancer ([place]) 03/21/2014    MLH1/PMS2 LOH  . Family history of breast cancer   . History of radiation therapy 2/10, 2/12, 2/18, 2/24, 05/29/14    vaginal cuff/ 30 Gy/ 5 fx    Past Surgical History  Procedure Laterality Date  . Excision vaginal cyst    . Breast implant removal  6/94    left breast  . Cataract extraction Bilateral   . Radical mastectomy lnd  1990    left, chemo done  . Breast reconstruction  1990    left  . Mastectomy Right 10/08/06    prophylactic  . Robotic assisted total hysterectomy with bilateral salpingo oopherectomy Bilateral 03/21/2014    Procedure: ROBOTIC ASSISTED TOTAL HYSTERECTOMY WITH BILATERAL  SALPINGO OOPHORECTOMY ;  Surgeon: Janie Morning, MD;  Location: [facility];  Service: Gynecology;  Laterality: Bilateral;    There were no vitals filed for this visit.      Subjective Assessment - 07/10/15 1414    Subjective I still have some pain but I need to do my exercises at home and want to be diacharged   Diagnostic tests x-ray: negative for fracture, AC joint separation   Patient Stated Goals Reduce Lt shoulder pain, return to using Lt UT, return to driving   Currently in Pain? Yes   Pain Score 2    Pain Location Shoulder   Pain Orientation Left   Pain Descriptors / Indicators Sore   Pain Type Acute pain   Pain Onset More than a month ago   Aggravating Factors  reach for seatbelt and behind her back, sleep on left shoulder   Pain Relieving Factors rest, ice             [facility] PT Assessment - 07/10/15 0001    Assessment   Medical Diagnosis Lt shoulder AC injury with possible preexisting cuff   Referring Provider Dr. Bevelyn Buckles   Onset Date/Surgical Date 05/01/15   Prior Therapy None   Precautions   Precautions Fall;Other (comment)   Precaution Comments cancer precautions  Restrictions   Weight Bearing Restrictions No   Balance Screen   Has the patient fallen in the past 6 months Yes  went into church with hands full and step caught foot, fell    How many times? 1   Has the patient had a decrease in activity level because of a fear of falling?  No   Is the patient reluctant to leave their home because of a fear of falling?  No   Home Ecologist residence   Prior Function   Level of Independence Independent   Vocation Retired   Associate Professor   Overall Cognitive Status Within Functional Limits for tasks assessed   Observation/Other Assessments   Focus on Therapeutic Outcomes (FOTO)  37% limitation   Posture/Postural Control   Posture/Postural Control Postural limitations   Postural Limitations Rounded Shoulders;Forward head   AROM    Left Shoulder Flexion 160 Degrees   Left Shoulder ABduction 175 Degrees   Left Shoulder Internal Rotation --  T5   Left Shoulder External Rotation 40 Degrees   PROM   Left Shoulder Flexion 180 Degrees  pain   Left Shoulder ABduction 180 Degrees   Left Shoulder External Rotation 50 Degrees   Strength   Overall Strength Comments Left shoulder flexion, abduction, ER,  4+/5, IR 5/5                     [facility] Adult PT Treatment/Exercise - 07/10/15 0001    Therapeutic Activites    Therapeutic Activities ADL's   ADL's lay on left wit hpillow under left trunk to take pressure off shoulder   Shoulder Exercises: Standing   Flexion AAROM;Left;10 reps   Flexion Limitations using finger ladder   Other Standing Exercises cone stack to 2nd shelf 2x 1 minutes with 1.5#   Shoulder Exercises: Pulleys   Flexion 3 minutes   Other Pulley Exercises IR x 2 min in standing   Manual Therapy   Manual Therapy Joint mobilization;Passive ROM;Soft tissue mobilization   Joint Mobilization anterior glide of shoulder in prone, and caudal glide in supine grad3   Soft tissue mobilization left axilla, subscapularis,    Passive ROM flexion, IR, ER and abduction of LT shoulder to pt tolerance                PT Education - 07/10/15 1439    Education provided Yes   Education Details lay on left side with a towel rolled under the rib cage to take pressure off the shoulder   Person(s) Educated Patient   Methods Explanation;Demonstration   Comprehension Verbalized understanding;Returned demonstration          PT Short Term Goals - 07/10/15 1429    PT SHORT TERM GOAL #1   Title be independent in initial HEP   Time 4   Period Weeks   Status Achieved   PT SHORT TERM GOAL #2   Title demonstrate Lt shoulder AROM flexion to > or = to 90 degrees to allow for reaching into microwave   Time 4   Period Weeks   Status Achieved   PT SHORT TERM GOAL #3   Title report < or = to 5/10 Lt shoulder  pain with use    Time 4   Period Weeks   Status Achieved   PT SHORT TERM GOAL #4   Title wean from sling and report > or = to 50% use of Lt UE with ADLs and self-care   Time  4   Period Weeks   Status Achieved           PT Long Term Goals - 27-Jul-2015 1429    PT LONG TERM GOAL #1   Title be independent in advanced HEP   Time 8   Period Weeks   Status Achieved   PT LONG TERM GOAL #2   Title reduce FOTO to < or = to 36% limitation   Time 8   Period Weeks   Status Partially Met  37%   PT LONG TERM GOAL #3   Title demonstrate Lt shoulder AROM flexion to > or = to 120 degrees to allow for reaching overhead   Time 8   Period Weeks   Status Achieved   PT LONG TERM GOAL #4   Title report < or = to 3/10 Lt shoulder pain with use    Time 8   Period Weeks   Status Achieved   PT LONG TERM GOAL #5   Title increase Lt shoulder strength to improve use of Lt UE to > or = to 75%    Time 8   Period Weeks   Status Achieved               Plan - 2015/07/27 1433    Clinical Impression Statement Patient has increased ROM for left shoulder to full for flexion, abduction but limited to 55 degrees for ER.  Lef t shoulder strength is 4+/5.  Patient pain is 2/10 when she lays on left shoulder, reach behind her back. and reach for seatbelt.  FOTO score improved to 37% limitation.  Patient is ready for discharge.      Rehab Potential Good   Clinical Impairments Affecting Rehab Potential None   PT Frequency 2x / week   PT Duration 8 weeks   PT Treatment/Interventions ADLs/Self Care Home Management;Cryotherapy;Moist Heat;Iontophoresis 86m/ml Dexamethasone;Functional mobility training;Therapeutic activities;Therapeutic exercise;Manual techniques;Patient/family education;Neuromuscular re-education;Passive range of motion   PT Next Visit Plan Discharge to HEP   PT Home Exercise Plan Current HEP   Consulted and Agree with Plan of Care Patient      Patient will benefit from skilled therapeutic  intervention in order to improve the following deficits and impairments:  Postural dysfunction, Decreased strength, Impaired flexibility, Pain, Decreased activity tolerance, Decreased range of motion, Increased muscle spasms, Increased fascial restricitons  Visit Diagnosis: Stiffness of left shoulder, not elsewhere classified  Pain in left shoulder  Muscle weakness (generalized)       G-Codes - [id number]    Functional Assessment Tool Used FOTO: 37% limitation   Functional Limitation Other PT primary   Other PT Primary Goal Status ((A0762 At least 20 percent but less than 40 percent impaired, limited or restricted   Other PT Primary Discharge Status ((U6333 At least 20 percent but less than 40 percent impaired, limited or restricted      Problem List Patient Active Problem List   Diagnosis Date Noted  . Genetic testing 05/23/2014  . History of breast cancer   . Family history of breast cancer   . Endometrial cancer ([place] 03/21/2014  . Uterine cancer ([place] 03/21/2014  . S/P mastectomy, bilateral 05/02/2013  . Situational stress 05/02/2013  . Osteoporosis, unspecified 05/02/2013  . Breast cancer, left breast ([place] 05/02/2013    CEarlie Counts PT 02017/04/282:43 PM   [facility] [address] [place] [address]: [phone]  Fax:  [phone] Name: Beth Simon  MRN: [id number] Date of Birth: September 02, 1940  PHYSICAL THERAPY DISCHARGE SUMMARY  Visits from Start of Care: 14 Current functional level related to goals / functional outcomes: See above.   Remaining deficits: See above.    Education / Equipment: HEP Plan: Patient agrees to discharge.  Patient goals were met. Patient is being discharged due to meeting the stated rehab goals. Thank you for the referral. Earlie Counts, PT 07/10/2015 2:43 PM   ?????

## 2015-07-23 DIAGNOSIS — I10 Essential (primary) hypertension: Secondary | ICD-10-CM | POA: Diagnosis not present

## 2015-07-23 DIAGNOSIS — M25551 Pain in right hip: Secondary | ICD-10-CM | POA: Diagnosis not present

## 2015-07-26 DIAGNOSIS — M1811 Unilateral primary osteoarthritis of first carpometacarpal joint, right hand: Secondary | ICD-10-CM | POA: Diagnosis not present

## 2015-07-26 DIAGNOSIS — R5381 Other malaise: Secondary | ICD-10-CM | POA: Diagnosis not present

## 2015-07-26 DIAGNOSIS — M810 Age-related osteoporosis without current pathological fracture: Secondary | ICD-10-CM | POA: Diagnosis not present

## 2015-07-26 DIAGNOSIS — M1812 Unilateral primary osteoarthritis of first carpometacarpal joint, left hand: Secondary | ICD-10-CM | POA: Diagnosis not present

## 2015-07-26 DIAGNOSIS — E559 Vitamin D deficiency, unspecified: Secondary | ICD-10-CM | POA: Diagnosis not present

## 2015-07-26 DIAGNOSIS — M1611 Unilateral primary osteoarthritis, right hip: Secondary | ICD-10-CM | POA: Diagnosis not present

## 2015-07-26 DIAGNOSIS — M47816 Spondylosis without myelopathy or radiculopathy, lumbar region: Secondary | ICD-10-CM | POA: Diagnosis not present

## 2015-08-01 NOTE — Progress Notes (Signed)
GYN ONCOLOGY OFFICE VISIT    Beth Simon 75 y.o. female  CC:  Endometrial cancer surveillance  Assessment/Plan:  [name]  is a 75 y.o.  witith Stage IA grade 2 endomerial cancer with 30% myoinvasion and LVSI. Staged 03/21/14.  Vaginal brachytherapy completed 05/29/2014 Follow-up in 3 months with Gyn Onc 12/2015 Follow-up with Dr. Isidore Moos 09/2015 Follow up with Kem Boroughs NP 03/2016  Pap test at that visit. NED  Fatigue: Encouraged to increase water intake and to begin a regular physical activity  HPI: [name]  is a 75 y.o.   G1 P1 menopausal.  Noted irregular pelvic cramping after the death of her husband.  Vaginal ultrasound 01/10/2014 endometrial stripe 14.9m.  Endometrial mass with cystic changes noted 2x1 cm.  Atrophic ovaries.  Uterine size 6.46x4.21 cm.  EMB 01/10/2014 Complex atypical hyperplasia.  History notable for left breast cancer ER-PR-  treated with radical mastectomy and chemotherapy in 1990.  Subsequently underwent right simple mastectomy 2008 without reconstruction. Denies use of tamoxifen  On 03/21/14  she underwent a robotic hysterectomy, BSO. Frozen section revealed grade 1 cancer. Final pathology revealed a grade 2 lesion with LVSI present and she was staged at stage IA. She was recommended vaginal brachytherapy as adjuvant therapy to reduce risk for local recurrence.  Dr SLanell Personsdelivered vaginal brachytherapy completed on 05/29/14. Her treatments were without complication. She has been using her vaginal dilator since.    Final pathology IHC Expression Result: MLH1: LOSS OF NUCLEAR EXPRESSION (LESS THAN 5% TUMOR EXPRESSION) MSH2: Preserved nuclear expression (greater 50% tumor expression) MSH6: Preserved nuclear expression (greater 50% tumor expression) PMS2: LOSS OF NUCLEAR EXPRESSION (LESS THAN 5% TUMOR EXPRESSION) * Internal control demonstrates intact nuclear expression Interpretation: ABNORMAL There is loss of the major  and minor MMR proteins MLH1 and PMS2. The loss of expression may be secondary to promoter hyper-methylation, gene mutation or other genetic event. BRAF mutation testing and/or MLH1 methylation testing is indicated. The presence of a BRAF mutation and/or MLH1 hyper-methylation is indicative of a sporadic-type tumor. The absence of either BRAF mutation and/or presence of normal-methylation indicate the possible presence of a hereditary germline mutation (e.g. Lynch syndrome) and referral to genetic counseling is warranted. It is recommended that the loss of protein expression be correlated with molecular based MSI testing.  Genetic testing 03/2014 negative FINAL DIAGNOSIS Diagnosis - INVASIVE ENDOMETRIOID CARCINOMA, FIGO GRADE II, LIMITED TO THE INNER HALF OF THE MYOMETRIUM WITH ANGIOLYMPHATIC INVASION. ADJACENT ENDOMETRIAL POLYP. - LEIOMYOMATA. - CERVIX: BENIGN SQUAMOUS MUCOSA AND ENDOCERVICAL MUCOSA. NO DYSPLASIA OR MALIGNANCY. - BILATERAL OVARIES: BENIGN OVARIAN TISSUE WITH ENDOSALPINGIOSIS, NO ATYPIA OR MALIGNANCY. - BILATERAL FALLOPIAN TUBES: BENIGN FALLOPIAN TUBAL TISSUE, NO ATYPIA OR MALIGNANCY.  Specimen: Uterus, cervix, bilateral ovaries and fallopian tubes. Procedure: Total hysterectomy and bilateral salpingo-oophorectomy. Lymph node sampling performed: No. Specimen integrity: Intact. Maximum tumor size: 3.0 cm. Histologic type: Invasive endometrioid adenocarcinoma. Grade: FIGO Grade II. Myometrial invasion: 0.4 cm where myometrium is 1.2 cm in thickness. (30%) Cervical stromal involvement: No. Extent of involvement of other organs: No. Lymph - vascular invasion: Present. Peritoneal washings: N/A. Lymph nodes: # examined N/A ; # positive N/A.  Subsequent genetic testing on the following 25 genes: APC, ATM, BARD1, BMPR1A, BRCA1, BRCA2, BRIP1, CHD1, CDK4, CDKN2A, CHEK2, EPCAM (large rearrangement only), MLH1, MSH2, MSH6, MUTYH, NBN, PALB2, PMS2, PTEN, RAD51C, RAD51D, SMAD4, STK11, and  TP53.   NEGATIVE  Pap 04/2015 wnl, inflammation noted  BRONETTE HANKis doing well.  Reports fatigue, has seen a grief counselor.otherwise without complaints  Social Hx:   Social History   Social History  . Marital Status: Widowed    Spouse Name: N/A  . Number of Children: 1  . Years of Education: N/A   Occupational History  . Not on file.   Social History Main Topics  . Smoking status: Never Smoker   . Smokeless tobacco: Never Used  . Alcohol Use: No  . Drug Use: No  . Sexual Activity: No   Other Topics Concern  . Not on file   Social History Narrative  Husband died 2013-08-16.    She traveled to [place] to see a sister who had Alzheimer's .  Has not decided about relocating to near her son's home.  Past Surgical History  Procedure Laterality Date  . Excision vaginal cyst    . Breast implant removal  6/94    left breast  . Cataract extraction Bilateral   . Radical mastectomy lnd  1990    left, chemo done  . Breast reconstruction  1990    left  . Mastectomy Right 10/08/06    prophylactic  . Robotic assisted total hysterectomy with bilateral salpingo oopherectomy Bilateral 03/21/2014    Procedure: ROBOTIC ASSISTED TOTAL HYSTERECTOMY WITH BILATERAL SALPINGO OOPHORECTOMY ;  Surgeon: Laurette Schimke, MD;  Location: [facility];  Service: Gynecology;  Laterality: Bilateral;     Past Medical Hx:  Past Medical History  Diagnosis Date  . Osteoporosis     on Evista  . Cystitis     cytoxen, had once or twice  . Vasovagal syncopes   . Macular degeneration   . Anemia   . Uterine fibroid   . Complication of anesthesia   . PONV (postoperative nausea and vomiting)   . Breast cancer (HCC) 1990    bilateral mastectomy, adenoca breast-left MRM, reconstruction, chemo  . Bronchitis last 2 weeks    saw dr Pete Glatter 02-28-2014, he said no antibiotics needed, nonproductive cough  . Shoulder pain     taking physical therapy for last few weeks  . Neck pain     taking physictal therapy  last 2 weeks  . History of breast cancer   . Uterine cancer (HCC) 03/21/2014    MLH1/PMS2 LOH  . Family history of breast cancer   . History of radiation therapy 2/10, 2/12, 2/18, 2/24, 05/29/14    vaginal cuff/ 30 Gy/ 5 fx    Past Gynecological History: G1P1 Menarche 11 . Long history of irregular and heavy mesnses.  Never used birth control. no h/o abnormal pap test Patient's last menstrual period was 03/31/1988 (approximate).  Family Hx:  Family History  Problem Relation Age of Onset  . Breast cancer Sister 73    recurrence age 66  . Breast cancer Paternal Grandmother   . Heart disease Mother   . Heart disease Father   . Hypertension Sister   . Dementia Sister   . Breast cancer Maternal Aunt 40  . Dementia Maternal Aunt     dx in her 38s  Maternal aunt breast cancer dx 68y/o Paternal grandmother breast cancer age at dx ?  REVIEW OF SYSTEMS Constitutional  Feels well residual daily fatigue Cardiovascular  No chest pain, shortness of breath, or edema,  Pulmonary  No cough or wheeze.  Gastro Intestinal  No nausea, vomitting, or diarrhoea. No bright red blood per rectum, no abdominal pain, change in bowel movement, or constipation.  Genito Urinary  No frequency, urgency, dysuria,vaginal dryness with  yellow discharge Musculo Skeletal  No myalgia, arthralgia, joint swelling or pain  Neurologic  No weakness, numbness, change in gait,  Psychology  No depression, anxiety, insomnia.    PHYSICAL EXAMINATION Vitals:  BP 135/69 mmHg  Pulse 75  Temp(Src) 97.9 F (36.6 C) (Oral)  Resp 18  Ht 4' 11.5" (1.511 m)  Wt 138 lb 3.2 oz (62.687 kg)  BMI 27.46 kg/m2  SpO2 98%  LMP 03/31/1988 (Approximate) Neck  Supple without any enlargements.  Cardiovascular  Pulse normal rate, regularity and rhythm.  Lungs  Clear to auscultation bilateraly, without wheezes/crackles/rhonchi. Good air movement.  Skin  No rash/lesions/breakdown  Psychiatry  Alert and oriented to person,  place, and time  Abdomen  Normoactive bowel sounds, abdomen soft, non-tender and non obese. Surgical sites well healed. No masses or tenderness at the port sites Genito Urinary  Vulva/vagina: Normal external female genitalia.  No lesions. Markedly atrophic vagina with non smelling yellow discharge No discharge or bleeding. . No blood or discharge is noted.  No pelvic tenderness  Bladder/urethra:  No lesions or masses Extremities  No bilateral cyanosis, clubbing or edema. No rash, lesions or petiche.

## 2015-08-02 ENCOUNTER — Encounter: Payer: Self-pay | Admitting: Gynecologic Oncology

## 2015-08-02 ENCOUNTER — Ambulatory Visit: Payer: PPO | Attending: Gynecologic Oncology | Admitting: Gynecologic Oncology

## 2015-08-02 VITALS — BP 135/69 | HR 75 | Temp 97.9°F | Resp 18 | Ht 59.5 in | Wt 138.2 lb

## 2015-08-02 DIAGNOSIS — Z8542 Personal history of malignant neoplasm of other parts of uterus: Secondary | ICD-10-CM

## 2015-08-02 DIAGNOSIS — Z853 Personal history of malignant neoplasm of breast: Secondary | ICD-10-CM | POA: Diagnosis not present

## 2015-08-02 DIAGNOSIS — N9489 Other specified conditions associated with female genital organs and menstrual cycle: Secondary | ICD-10-CM | POA: Diagnosis not present

## 2015-08-02 DIAGNOSIS — C541 Malignant neoplasm of endometrium: Secondary | ICD-10-CM | POA: Insufficient documentation

## 2015-08-02 DIAGNOSIS — Z9221 Personal history of antineoplastic chemotherapy: Secondary | ICD-10-CM | POA: Insufficient documentation

## 2015-08-02 DIAGNOSIS — R5382 Chronic fatigue, unspecified: Secondary | ICD-10-CM

## 2015-08-02 DIAGNOSIS — C50912 Malignant neoplasm of unspecified site of left female breast: Secondary | ICD-10-CM

## 2015-08-02 NOTE — Patient Instructions (Addendum)
Follow-up in 3 months with Gyn Onc 12/2015 Follow-up with Dr. Isidore Moos 09/2015 Follow up with Kem Boroughs NP 03/2016  Pap test at that visit.

## 2015-08-29 DIAGNOSIS — M810 Age-related osteoporosis without current pathological fracture: Secondary | ICD-10-CM | POA: Diagnosis not present

## 2015-08-29 DIAGNOSIS — E559 Vitamin D deficiency, unspecified: Secondary | ICD-10-CM | POA: Diagnosis not present

## 2015-08-30 DIAGNOSIS — M1611 Unilateral primary osteoarthritis, right hip: Secondary | ICD-10-CM | POA: Diagnosis not present

## 2015-09-17 DIAGNOSIS — I10 Essential (primary) hypertension: Secondary | ICD-10-CM | POA: Diagnosis not present

## 2015-09-27 DIAGNOSIS — M1611 Unilateral primary osteoarthritis, right hip: Secondary | ICD-10-CM | POA: Diagnosis not present

## 2015-09-28 ENCOUNTER — Encounter: Payer: Self-pay | Admitting: Podiatry

## 2015-09-28 ENCOUNTER — Ambulatory Visit (INDEPENDENT_AMBULATORY_CARE_PROVIDER_SITE_OTHER): Payer: PPO | Admitting: Podiatry

## 2015-09-28 DIAGNOSIS — L84 Corns and callosities: Secondary | ICD-10-CM | POA: Diagnosis not present

## 2015-09-28 DIAGNOSIS — B351 Tinea unguium: Secondary | ICD-10-CM | POA: Diagnosis not present

## 2015-09-28 DIAGNOSIS — M79676 Pain in unspecified toe(s): Secondary | ICD-10-CM

## 2015-09-29 NOTE — Progress Notes (Signed)
Patient ID: Beth Simon, female   DOB: 11/17/1940, 75 y.o.   MRN: [id number]  Subjective: 74 y.o. returns the office today for painful, elongated, thickened toenails which she is unable to trim herself. Denies any redness or drainage around the nails. She also has a callus the end of her left second toe which is painful. Denies any swelling redness or drainage. Denies any acute changes since last appointment and no new complaints today. Denies any systemic complaints such as fevers, chills, nausea, vomiting.   Objective: AAO 3, NAD DP/PT pulses palpable, CRT less than 3 seconds Nails hypertrophic, dystrophic, elongated, brittle, discolored 10. There is tenderness overlying the nails 1-5 bilaterally. There is no surrounding erythema or drainage along the nail sites. Hyperkeratotic lesions of the distal aspect left second digit. Upon debridement there is no underlying ulceration, drainage or other signs of infection. No open lesions or pre-ulcerative lesions are identified. No other areas of tenderness bilateral lower extremities. No overlying edema, erythema, increased warmth. Hammertoe contractures bilaerally Bilateral lower extremity edema around the ankles. No pain with calf compression, swelling, warmth, erythema. Exam appears to be mostly unchanged  Assessment: Patient presents with symptomatic onychomycosis; calluses   Plan: -Treatment options including alternatives, risks, complications were discussed -Nails sharply debrided 10 without complication/bleeding. -Hyperkeratotic lesion sharply debrided 1 without complication/bleeding. -Discussed daily foot inspection. If there are any changes, to call the office immediately.  -Follow-up in 3 months or sooner if any problems are to arise. In the meantime, encouraged to call the office with any questions, concerns, changes symptoms.  Celesta Gentile, DPM

## 2015-10-04 DIAGNOSIS — L814 Other melanin hyperpigmentation: Secondary | ICD-10-CM | POA: Diagnosis not present

## 2015-10-04 DIAGNOSIS — L821 Other seborrheic keratosis: Secondary | ICD-10-CM | POA: Diagnosis not present

## 2015-10-05 ENCOUNTER — Encounter: Payer: Self-pay | Admitting: Radiation Oncology

## 2015-10-05 ENCOUNTER — Ambulatory Visit
Admission: RE | Admit: 2015-10-05 | Discharge: 2015-10-05 | Disposition: A | Discharge status: Home / Self Care | Payer: PPO | Source: Ambulatory Visit | Attending: Radiation Oncology | Admitting: Radiation Oncology

## 2015-10-05 VITALS — BP 132/68 | HR 74 | Temp 97.9°F | Ht 59.5 in | Wt 135.5 lb

## 2015-10-05 DIAGNOSIS — Z08 Encounter for follow-up examination after completed treatment for malignant neoplasm: Secondary | ICD-10-CM | POA: Diagnosis not present

## 2015-10-05 DIAGNOSIS — Z8544 Personal history of malignant neoplasm of other female genital organs: Secondary | ICD-10-CM | POA: Diagnosis not present

## 2015-10-05 DIAGNOSIS — C541 Malignant neoplasm of endometrium: Secondary | ICD-10-CM

## 2015-10-05 NOTE — Progress Notes (Signed)
Beth Simon is here for follow up of radiation completed 05/29/2014 to her Vaginal Cuff. She reports pain a 3/10 in her Right Hip which she will occasionally take an alleve to help with this pain. She denies any bowel or bladder problems. She has no unusual vaginal discharge, or bleeding. She has gone to physical therapy for pelvic exercises which have greatly improved her pelvic examinations. She is using a vaginal dilator about every couple of weeks. She is also using coconut oil for vaginal dryness. She is eating well per her report. She feels well, she has completed grief counseling with Hospice, and reports improvement in her mood.   BP 132/68 mmHg  Pulse 74  Temp(Src) 97.9 F (36.6 C)  Ht 4' 11.5" (1.511 m)  Wt 135 lb 8 oz (61.462 kg)  BMI 26.92 kg/m2  LMP 03/31/1988 (Approximate)   Wt Readings from Last 3 Encounters:  10/05/15 135 lb 8 oz (61.462 kg)  08/02/15 138 lb 3.2 oz (62.687 kg)  04/16/15 136 lb (61.689 kg)

## 2015-10-05 NOTE — Progress Notes (Signed)
Radiation Oncology         [phone] ________________________________  Name: Beth Simon MRN: [id number]  Date: 10/05/2015  DOB: 27-Dec-1940  Follow-Up Visit Note  Outpatient  CC: Mathews Argyle, MD  Nancy Marus, MD  Diagnosis and Prior Radiotherapy:    ICD-9-CM ICD-10-CM   1. Endometrial cancer ([place]) 182.0 C54.1     FIGO Stage IA;  pT1a, pNX   Indication for treatment:  Post-op, curative    Radiation treatment dates:   05/10/2014, 05/12/2014, 05/18/2014, 05/24/2014, 05/29/2014  Site/dose:   Vaginal Cuff / 30 Gy in 5 fractions  Beams/energy:   Intracavitary cylinder brachytherapy / Iridium HDR  Narrative: Ms. Dopson is here for follow-up of brachytherapy/ HDR for endometrial cancer from 05/10/14 till 05/29/2014. She saw Dr. Skeet Latch on may 3rd and will see her again in October. She also is following with Kem Boroughs, a family practice NP who has been performing pelvic exams as well. Pap smear in January of 2017 was negative for malignancy.  It did show marked/servere inflammation.  Gal is here for follow up of radiation completed 05/29/2014 to her Vaginal Cuff. She reports pain a 3/10 in her Right Hip which she will occasionally take an alleve to help with this pain. She denies any bowel or bladder problems. She has no unusual vaginal discharge, or bleeding. She has gone to physical therapy for pelvic exercises which have greatly improved her pelvic examinations. She is using a vaginal dilator about every couple of weeks. She is also using coconut oil for vaginal dryness. She is eating well per her report. She feels well, she has completed grief counseling with Hospice, and reports improvement in her mood.    ALLERGIES:  is allergic to novocain.  Meds: Current Outpatient Prescriptions  Medication Sig Dispense Refill  . amLODipine (NORVASC) 2.5 MG tablet Take 2.5 mg by mouth daily.  6  . Coenzyme Q10 (COQ10 PO) Take 1 tablet by mouth every morning.     . Cyanocobalamin  (VITAMIN B 12 PO) Take 1 tablet by mouth every morning.     . denosumab (PROLIA) 60 MG/ML SOLN injection Inject 60 mg into the skin every 6 (six) months. Administer in upper arm, thigh, or abdomen    . Flaxseed, Linseed, (FLAX SEEDS PO) Take 1 scoop by mouth every morning. Ground flaxseed in cereal.    . Glucosamine HCl (GLUCOSAMINE PO) Take 1 tablet by mouth every morning.     Marland Kitchen MELATONIN PO Take 1 tablet by mouth at bedtime as needed (sleep.).     Marland Kitchen Omega-3 Fatty Acids (FISH OIL PO) Take 1 capsule by mouth every morning.     Marland Kitchen OVER THE COUNTER MEDICATION Take 1 tablet by mouth every morning. Astazanthin. (anti-oxidant)    . raloxifene (EVISTA) 60 MG tablet Take 1 tablet (60 mg total) by mouth daily. 90 tablet 3  . VITAMIN D, CHOLECALCIFEROL, PO Take 4,000 Int'l Units by mouth every morning.      No current facility-administered medications for this encounter.    Physical Findings: The patient is in no acute distress. Patient is alert and oriented.  height is 4' 11.5" (1.511 m) and weight is 135 lb 8 oz (61.462 kg). Her temperature is 97.9 F (36.6 C). Her blood pressure is 132/68 and her pulse is 74. .     Neck without any masses.  Heart regular in rate and rhythm, no murmurs.  Chest clear to ascultation bilaterally.  Abdomen without rigidity or guarding or tenderness.  Lymph: Groin without palpable masses.  Psych: affect appropriate, judgment intact GYN Exam: revealed normal external genitalia.  Digital and speculum exam show no signs of recurrence. No sign of fistula or recurrence on vaginal-rectal exam.    Lab Findings: Lab Results  Component Value Date   WBC 17.0* 03/22/2014   HGB 12.1 03/22/2014   HCT 37.3 03/22/2014   MCV 94.9 03/22/2014   PLT 186 03/22/2014     CMP     Component Value Date/Time   NA 136 03/22/2014 0412   K 4.1 03/22/2014 0412   CL 109 03/22/2014 0412   CO2 22 03/22/2014 0412   GLUCOSE 112* 03/22/2014 0412   BUN 9 03/22/2014 0412   CREATININE  0.63 03/22/2014 0412   CALCIUM 8.0* 03/22/2014 0412   PROT 7.6 03/01/2014 1040   ALBUMIN 3.7 03/01/2014 1040   AST 22 03/01/2014 1040   ALT 20 03/01/2014 1040   ALKPHOS 53 03/01/2014 1040   BILITOT <0.2* 03/01/2014 1040   GFRNONAA 86* 03/22/2014 0412   GFRAA >90 03/22/2014 0412      Radiographic Findings: No results found.  Impression/Plan:  No evidence of disease. She is followed closely by Gyn onc as well as her family practitioner who is able to do pap smears and pelvic exams. Nevertheless I will continue to see her on a yearly basis, more if needed. She does report some mild right hip pain, and she's followed by an orthopedic provider for this. She asked if her hip pain could have anything to do with her radiotherapy, and I assured her that her type of radiotherapy would not have caused hip pain or hip injury. Risk of bone metastases is relatively low. I told her that she can certainly speak with her orthopedic provider as to whether an MRI might be warranted. Ie, consider MRI in the future,  if this pain does get worse or does not behave like typical arthritis . Apparently, x-ray of he right hip showed mild to moderate arthritis.   She will try to use her vaginal dilator a bit more often.    _____________________________________   [name], MD   This document serves as a record of services personally performed by [name] , MD. It was created on his behalf by Truddie Hidden, a trained medical scribe. The creation of this record is based on the scribe's personal observations and the provider's statements to them. This document has been checked and approved by the attending provider.

## 2015-10-24 DIAGNOSIS — M25551 Pain in right hip: Secondary | ICD-10-CM | POA: Diagnosis not present

## 2015-10-31 DIAGNOSIS — M25551 Pain in right hip: Secondary | ICD-10-CM | POA: Diagnosis not present

## 2015-11-02 DIAGNOSIS — M7061 Trochanteric bursitis, right hip: Secondary | ICD-10-CM | POA: Diagnosis not present

## 2015-11-02 DIAGNOSIS — M25551 Pain in right hip: Secondary | ICD-10-CM | POA: Diagnosis not present

## 2015-11-09 ENCOUNTER — Ambulatory Visit: Payer: Self-pay | Admitting: Radiation Oncology

## 2015-11-12 ENCOUNTER — Ambulatory Visit: Payer: PPO | Attending: Sports Medicine | Admitting: Physical Therapy

## 2015-11-12 DIAGNOSIS — M6281 Muscle weakness (generalized): Secondary | ICD-10-CM | POA: Insufficient documentation

## 2015-11-12 DIAGNOSIS — M25551 Pain in right hip: Secondary | ICD-10-CM

## 2015-11-12 NOTE — Therapy (Signed)
[facility] [address] [place], [place], [address] Phone: [phone]   Fax:  [phone]  Physical Therapy Evaluation  Patient Details  Name: Beth Simon MRN: [id number] Date of Birth: Aug 01, 1940 Referring Provider: Wandra Feinstein  Encounter Date: 11/12/2015      PT End of Session - 11/12/15 1056    Visit Number 1   Date for PT Re-Evaluation 12/24/15   PT Start Time 1020   PT Stop Time 1100   PT Time Calculation (min) 40 min   Activity Tolerance Patient tolerated treatment well   Behavior During Therapy [facility] for tasks assessed/performed      Past Medical History:  Diagnosis Date  . Anemia   . Breast cancer ([place]) 1990   bilateral mastectomy, adenoca breast-left MRM, reconstruction, chemo  . Bronchitis last 2 weeks   saw dr Felipa Eth 02-28-2014, he said no antibiotics needed, nonproductive cough  . Complication of anesthesia   . Cystitis    cytoxen, had once or twice  . Family history of breast cancer   . History of breast cancer   . History of radiation therapy 2/10, 2/12, 2/18, 2/24, 05/29/14   vaginal cuff/ 30 Gy/ 5 fx  . Macular degeneration   . Neck pain    taking physictal therapy last 2 weeks  . Osteoporosis    on Evista  . PONV (postoperative nausea and vomiting)   . Shoulder pain    taking physical therapy for last few weeks  . Uterine cancer ([place]) 03/21/2014   MLH1/PMS2 LOH  . Uterine fibroid   . Vasovagal syncopes     Past Surgical History:  Procedure Laterality Date  . BREAST IMPLANT REMOVAL  6/94   left breast  . BREAST RECONSTRUCTION  1990   left  . CATARACT EXTRACTION Bilateral   . EXCISION VAGINAL CYST    . MASTECTOMY Right 10/08/06   prophylactic  . RADICAL MASTECTOMY LND  1990   left, chemo done  . ROBOTIC ASSISTED TOTAL HYSTERECTOMY WITH BILATERAL SALPINGO OOPHERECTOMY Bilateral 03/21/2014   Procedure: ROBOTIC ASSISTED TOTAL HYSTERECTOMY WITH BILATERAL SALPINGO  OOPHORECTOMY ;  Surgeon: Janie Morning, MD;  Location: [facility];  Service: Gynecology;  Laterality: Bilateral;    There were no vitals filed for this visit.       Subjective Assessment - 11/12/15 1024    Subjective Pt with fall in January, onset of hip pain, multiple MD visits and eventually diagnosed with bursitis of Rt hip, pt had injection which helped a bit   Pertinent History injection Rt hip 8/4   Limitations Standing   How long can you stand comfortably? 30 minutes   How long can you walk comfortably? 10 minutes   Diagnostic tests MRI positive for bursitis   Pain Score 2    Pain Location Hip   Pain Orientation Right   Pain Descriptors / Indicators Aching   Pain Type Chronic pain   Pain Onset More than a month ago   Aggravating Factors  sit to stand, stand for a long time, prolonged walking   Pain Relieving Factors ice, rest, aleve   Multiple Pain Sites No            [facility] PT Assessment - 11/12/15 0001      Assessment   Medical Diagnosis Rt hip bursitis, Rt hip labral fraying   Referring Provider Wandra Feinstein   Prior Therapy none for this problem     Precautions   Precautions Fall  Precaution Comments cancer     Restrictions   Weight Bearing Restrictions No     Balance Screen   Has the patient fallen in the past 6 months No     Prior Function   Level of Independence Independent   Vocation Retired     Associate Professor   Overall Cognitive Status Within Functional Limits for tasks assessed     Observation/Other Assessments   Focus on Therapeutic Outcomes (FOTO)  47% limited     Posture/Postural Control   Posture Comments stands with wt shifted to Lt LE     ROM / Strength   AROM / PROM / Strength AROM;Strength     AROM   Overall AROM Comments bilat hip ROM WFL, pain with end range ER on Rt     Palpation   Palpation comment ttp Rt greater trochanter, ITB                           PT Education - 11/12/15 1055    Education  provided Yes   Education Details ionto, PT POC, HEP   Person(s) Educated Patient   Methods Explanation;Demonstration;Handout   Comprehension Returned demonstration;Verbalized understanding          PT Short Term Goals - 11/12/15 1103      PT SHORT TERM GOAL #1   Title be independent in initial HEP   Time 3   Period Weeks   Status New     PT SHORT TERM GOAL #2   Title Pt will demo Rt LE strength 4/5 to improve ability to perform home and recreation activities with decreased pain   Time 3   Period Weeks   Status New           PT Long Term Goals - 11/12/15 1105      PT LONG TERM GOAL #1   Title be independent in advanced HEP   Time 6   Period Weeks   Status New     PT LONG TERM GOAL #2   Title reduce FOTO to < 38% to demo improved mobility   Time 6   Period Weeks   Status New     PT LONG TERM GOAL #3   Title 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 pt will benefit from skilled PT to address deficits and improve funcitonal mobility   Rehab Potential Good   PT Frequency 2x / week   PT Duration 6 weeks   PT Treatment/Interventions ADLs/Self Care Home Management;Cryotherapy;Electrical Stimulation;Iontophoresis 86m/ml Dexamethasone;Functional mobility training;Passive range of motion;Patient/family education;Gait training;Stair training;Ultrasound;Therapeutic activities;Moist Heat;Balance training;Neuromuscular re-education;Therapeutic exercise;Manual techniques;Dry needling;Taping   PT Next Visit Plan assess HEP, manual and modalities as  tolerated   PT Home Exercise Plan SLR all directions   Consulted and Agree with Plan of Care Patient      Patient will benefit from skilled therapeutic intervention in  order to improve the following deficits and impairments:  Decreased activity tolerance, Pain, Decreased endurance, Difficulty walking, Decreased strength, Increased edema, Decreased balance  Visit Diagnosis: Muscle weakness (generalized) - Plan: PT plan of care cert/re-cert  Pain in right hip - Plan: PT plan of care cert/re-cert      G-Codes - 76/81/15 1107    Functional Assessment Tool Used FOTO 47% limitation   Functional Limitation Mobility: Walking and moving around   Mobility: Walking and Moving Around Current Status [phone]) At least 20 percent but less than 40 percent impaired, limited or restricted   Mobility: Walking and Moving Around Goal Status [phone]) At least 20 percent but less than 40 percent impaired, limited or restricted   Other PT Primary Current Status (C1638) At least 20 percent but less than 40 percent impaired, limited or restricted   Other PT Primary Goal Status (G5364) At least 20 percent but less than 40 percent impaired, limited or restricted       Problem List Patient Active Problem List   Diagnosis Date Noted  . Genetic testing 05/23/2014  . History of breast cancer   . Family history of breast cancer   . Endometrial cancer ([place]) 03/21/2014  . Uterine cancer ([place]) 03/21/2014  . S/P mastectomy, bilateral 05/02/2013  . Situational stress 05/02/2013  . Osteoporosis, unspecified 05/02/2013  . Breast cancer, left breast ([place]) 05/02/2013    Beth Simon, PT, DPT 11/12/2015, 11:13 AM  [facility] [address], [place], [place], [address] Phone: [phone]   Fax:  [phone]  Name: Beth Simon MRN: [id number] Date of Birth: 24-Feb-1941

## 2015-11-12 NOTE — Patient Instructions (Addendum)
Straight Leg Raise    Tighten stomach and slowly raise locked right leg ____ inches from floor. Repeat ____ times per set. Do ____ sets per session. Do ____ sessions per day.  http://orth.exer.us/1102   Copyright  VHI. All rights reserved.  Strengthening: Hip Abduction (Side-Lying)    Tighten muscles on front of left thigh, then lift leg ____ inches from surface, keeping knee locked.  Repeat ____ times per set. Do ____ sets per session. Do ____ sessions per day.  http://orth.exer.us/622   Copyright  VHI. All rights reserved.  Straight Leg Raise (Prone)    Abdomen and head supported, keep left knee locked and raise leg at hip. Avoid arching low back. Repeat ____ times per set. Do ____ sets per session. Do ____ sessions per day.  http://orth.exer.us/1112   Copyright  VHI. All rights reserved.

## 2015-11-15 ENCOUNTER — Encounter: Payer: Self-pay | Admitting: Physical Therapy

## 2015-11-15 ENCOUNTER — Ambulatory Visit: Payer: PPO | Admitting: Physical Therapy

## 2015-11-15 DIAGNOSIS — M25551 Pain in right hip: Secondary | ICD-10-CM

## 2015-11-15 DIAGNOSIS — M6281 Muscle weakness (generalized): Secondary | ICD-10-CM

## 2015-11-15 NOTE — Therapy (Signed)
[facility] [address] [place], [place], [address] Phone: [phone]   Fax:  [phone]  Physical Therapy Treatment  Patient Details  Name: Beth Simon MRN: [id number] Date of Birth: 06/21/1940 Referring Provider: Wandra Feinstein  Encounter Date: 11/15/2015      PT End of Session - 11/15/15 1132    Visit Number 2   Date for PT Re-Evaluation 12/24/15   PT Start Time 1056   PT Stop Time 1133   PT Time Calculation (min) 37 min      Past Medical History:  Diagnosis Date  . Anemia   . Breast cancer ([place]) 1990   bilateral mastectomy, adenoca breast-left MRM, reconstruction, chemo  . Bronchitis last 2 weeks   saw dr Felipa Eth 02-28-2014, he said no antibiotics needed, nonproductive cough  . Complication of anesthesia   . Cystitis    cytoxen, had once or twice  . Family history of breast cancer   . History of breast cancer   . History of radiation therapy 2/10, 2/12, 2/18, 2/24, 05/29/14   vaginal cuff/ 30 Gy/ 5 fx  . Macular degeneration   . Neck pain    taking physictal therapy last 2 weeks  . Osteoporosis    on Evista  . PONV (postoperative nausea and vomiting)   . Shoulder pain    taking physical therapy for last few weeks  . Uterine cancer ([place]) 03/21/2014   MLH1/PMS2 LOH  . Uterine fibroid   . Vasovagal syncopes     Past Surgical History:  Procedure Laterality Date  . BREAST IMPLANT REMOVAL  6/94   left breast  . BREAST RECONSTRUCTION  1990   left  . CATARACT EXTRACTION Bilateral   . EXCISION VAGINAL CYST    . MASTECTOMY Right 10/08/06   prophylactic  . RADICAL MASTECTOMY LND  1990   left, chemo done  . ROBOTIC ASSISTED TOTAL HYSTERECTOMY WITH BILATERAL SALPINGO OOPHERECTOMY Bilateral 03/21/2014   Procedure: ROBOTIC ASSISTED TOTAL HYSTERECTOMY WITH BILATERAL SALPINGO OOPHORECTOMY ;  Surgeon: Janie Morning, MD;  Location: [facility];  Service: Gynecology;  Laterality: Bilateral;    There  were no vitals filed for this visit.      Subjective Assessment - 11/15/15 1056    Subjective Pt states she has been feeling "a little better"   Patient Stated Goals Reduce Lt shoulder pain, return to using Lt UT, return to driving   Currently in Pain? No/denies                         [facility] Adult PT Treatment/Exercise - 11/15/15 0001      Exercises   Exercises Knee/Hip     Knee/Hip Exercises: Aerobic   Nustep level 1 x 5 minutes     Knee/Hip Exercises: Supine   Straight Leg Raises Strengthening;2 sets;10 reps     Knee/Hip Exercises: Sidelying   Hip ABduction Strengthening;2 sets;10 reps   Clams 2 x 10     Knee/Hip Exercises: Prone   Hip Extension Strengthening;2 sets;10 reps     Modalities   Modalities Ultrasound     Ultrasound   Ultrasound Location Rt hip   Ultrasound Parameters 3.3MHz, 0.8 W/cm2, 8 min, 50%   Ultrasound Goals Edema;Pain     Iontophoresis   Type of Iontophoresis Dexamethasone   Location Rt hip   Dose 73m   Time --  #2     Manual Therapy   Manual Therapy Soft tissue mobilization  Soft tissue mobilization Rt ITB, trigger point release                PT Education - 11/15/15 1132    Education provided Yes   Education Details add clam shell to HEP   Person(s) Educated Patient   Methods Explanation;Demonstration   Comprehension Returned demonstration;Verbalized understanding          PT Short Term Goals - 11/15/15 1134      PT SHORT TERM GOAL #1   Title be independent in initial HEP   Status On-going     PT SHORT TERM GOAL #2   Title Pt will demo Rt LE strength 4/5 to improve ability to perform home and recreation activities with decreased pain   Status On-going     PT SHORT TERM GOAL #3   Title report < or = to 5/10 Lt shoulder pain with use            PT Long Term Goals - 11/15/15 1134      PT LONG TERM GOAL #1   Title be independent in advanced HEP   Status On-going     PT LONG TERM GOAL #2    Title reduce FOTO to < 38% to demo improved mobility   Status On-going     PT LONG TERM GOAL #3   Title 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 Pt tolerated ionto and ultrasound well, will continue to benefit to decrease pain and improver Rt hip ROM and strength   Rehab Potential Good   PT Frequency 2x / week   PT Duration 6 weeks   PT Treatment/Interventions ADLs/Self Care Home Management;Cryotherapy;Electrical Stimulation;Iontophoresis '4mg'$ /ml Dexamethasone;Functional mobility training;Passive range of motion;Patient/family education;Gait training;Stair training;Ultrasound;Therapeutic activities;Moist Heat;Balance training;Neuromuscular re-education;Therapeutic exercise;Manual techniques;Dry needling;Taping   PT Next Visit Plan ionto #3, progress hip strength   Consulted and Agree with Plan of Care Patient      Patient will benefit from skilled therapeutic intervention in order to improve the following deficits and impairments:  Decreased activity tolerance, Pain, Decreased endurance, Difficulty walking, Decreased strength, Increased edema, Decreased balance  Visit Diagnosis: Muscle weakness (generalized)  Pain in right hip     Problem List Patient Active Problem List   Diagnosis Date Noted  . Genetic testing 05/23/2014  . History of breast cancer   . Family history of breast cancer   . Endometrial cancer ([place]) 03/21/2014  . Uterine cancer ([place]) 03/21/2014  . S/P mastectomy, bilateral 05/02/2013  . Situational stress 05/02/2013  . Osteoporosis, unspecified 05/02/2013  . Breast cancer, left breast ([place]) 05/02/2013    Isabelle Course, PT, DPT 11/15/2015, 11:36  AM  [facility] [address] [place], [place], [address] Phone: [phone]   Fax:  [phone]  Name: Beth Simon MRN: [id number] Date of Birth: [birth year]

## 2015-11-19 ENCOUNTER — Ambulatory Visit: Payer: PPO | Admitting: Physical Therapy

## 2015-11-19 ENCOUNTER — Encounter: Payer: Self-pay | Admitting: Physical Therapy

## 2015-11-19 DIAGNOSIS — M6281 Muscle weakness (generalized): Secondary | ICD-10-CM

## 2015-11-19 DIAGNOSIS — M7061 Trochanteric bursitis, right hip: Secondary | ICD-10-CM | POA: Diagnosis not present

## 2015-11-19 DIAGNOSIS — I10 Essential (primary) hypertension: Secondary | ICD-10-CM | POA: Diagnosis not present

## 2015-11-19 DIAGNOSIS — M25551 Pain in right hip: Secondary | ICD-10-CM

## 2015-11-19 NOTE — Therapy (Signed)
[facility] [address] [place], [place], [address] Phone: [phone]   Fax:  [phone]  Physical Therapy Treatment  Patient Details  Name: Beth Simon MRN: [id number] Date of Birth: Oct 09, 1940 Referring Provider: Wandra Feinstein  Encounter Date: 11/19/2015      PT End of Session - 11/19/15 1239    Visit Number 3   Number of Visits 20   Date for PT Re-Evaluation 12/24/15   PT Start Time 1232   PT Stop Time 1311   PT Time Calculation (min) 39 min   Activity Tolerance Patient tolerated treatment well   Behavior During Therapy [facility] for tasks assessed/performed      Past Medical History:  Diagnosis Date  . Anemia   . Breast cancer ([place]) 1990   bilateral mastectomy, adenoca breast-left MRM, reconstruction, chemo  . Bronchitis last 2 weeks   saw dr Felipa Eth 02-28-2014, he said no antibiotics needed, nonproductive cough  . Complication of anesthesia   . Cystitis    cytoxen, had once or twice  . Family history of breast cancer   . History of breast cancer   . History of radiation therapy 2/10, 2/12, 2/18, 2/24, 05/29/14   vaginal cuff/ 30 Gy/ 5 fx  . Macular degeneration   . Neck pain    taking physictal therapy last 2 weeks  . Osteoporosis    on Evista  . PONV (postoperative nausea and vomiting)   . Shoulder pain    taking physical therapy for last few weeks  . Uterine cancer ([place]) 03/21/2014   MLH1/PMS2 LOH  . Uterine fibroid   . Vasovagal syncopes     Past Surgical History:  Procedure Laterality Date  . BREAST IMPLANT REMOVAL  6/94   left breast  . BREAST RECONSTRUCTION  1990   left  . CATARACT EXTRACTION Bilateral   . EXCISION VAGINAL CYST    . MASTECTOMY Right 10/08/06   prophylactic  . RADICAL MASTECTOMY LND  1990   left, chemo done  . ROBOTIC ASSISTED TOTAL HYSTERECTOMY WITH BILATERAL SALPINGO OOPHERECTOMY Bilateral 03/21/2014   Procedure: ROBOTIC ASSISTED TOTAL HYSTERECTOMY WITH  BILATERAL SALPINGO OOPHORECTOMY ;  Surgeon: Janie Morning, MD;  Location: [facility];  Service: Gynecology;  Laterality: Bilateral;    There were no vitals filed for this visit.      Subjective Assessment - 11/19/15 1237    Subjective Had a breakthrough this AM beign able to stand on one foot to put socks on.    Currently in Pain? Yes   Pain Score 2    Pain Location Hip   Pain Orientation Right   Pain Descriptors / Indicators Dull;Aching   Aggravating Factors  Moving   Pain Relieving Factors Ice, rest   Multiple Pain Sites No                         [facility] Adult PT Treatment/Exercise - 11/19/15 0001      Knee/Hip Exercises: Stretches   Other Knee/Hip Stretches Hip ER release in supine  2 x30 sec   Other Knee/Hip Stretches RT knee to opposite shoulder 3x 20 sec     Knee/Hip Exercises: Aerobic   Nustep L1 x 6 min     Knee/Hip Exercises: Standing   Hip Flexion AROM;Both;2 sets;10 reps   Hip Abduction Stengthening;Both;2 sets;10 reps   Hip Extension Stengthening;Both;2 sets;20 reps;Knee straight     Knee/Hip Exercises: Supine   Quad Sets --  Azzie Almas  set/ball squeeze 2x10    Straight Leg Raises Strengthening;Right;2 sets;10 reps  VC for core & eccentric lowering     Knee/Hip Exercises: Sidelying   Clams 2 x 10 adding yellow tband     Iontophoresis   Type of Iontophoresis Dexamethasone   Location Rt lateral hip   Dose 1 ml   Time 6 hr wear  #3                PT Education - 11/19/15 1304    Education provided Yes   Education Details RT hip ER release   Person(s) Educated Patient   Methods Explanation;Demonstration;Tactile cues;Verbal cues   Comprehension Verbalized understanding;Returned demonstration          PT Short Term Goals - 11/19/15 1241      PT SHORT TERM GOAL #1   Title be independent in initial HEP   Time 3   Period Weeks   Status Achieved           PT Long Term Goals - 11/15/15 1134      PT LONG TERM GOAL #1    Title be independent in advanced HEP   Status On-going     PT LONG TERM GOAL #2   Title reduce FOTO to < 38% to demo improved mobility   Status On-going     PT LONG TERM GOAL #3   Title 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 Added some resistance to exercises today. pt did not have any increased pain with this.    Rehab Potential Good   Clinical Impairments Affecting Rehab Potential None   PT Frequency 2x / week   PT Duration 6 weeks   PT Treatment/Interventions ADLs/Self Care Home Management;Cryotherapy;Electrical Stimulation;Iontophoresis '4mg'$ /ml Dexamethasone;Functional mobility training;Passive range of motion;Patient/family education;Gait training;Stair training;Ultrasound;Therapeutic activities;Moist Heat;Balance training;Neuromuscular re-education;Therapeutic exercise;Manual techniques;Dry needling;Taping   PT Next Visit Plan [place] #4, hip strength and increase hip ER gently   Consulted and Agree with Plan of Care --      Patient will benefit from skilled therapeutic intervention in order to improve the following deficits and impairments:  Decreased activity tolerance, Pain, Decreased endurance, Difficulty walking, Decreased strength, Increased edema, Decreased balance  Visit Diagnosis: Muscle weakness (generalized)  Pain in right hip     Problem List Patient Active Problem List   Diagnosis Date Noted  . Genetic testing 05/23/2014  . History of breast cancer   . Family history of breast cancer   . Endometrial cancer ([place]) 03/21/2014  . Uterine cancer ([place]) 03/21/2014  . S/P mastectomy, bilateral 05/02/2013  . Situational  stress 05/02/2013  . Osteoporosis, unspecified 05/02/2013  . Breast cancer, left breast ([place]) 05/02/2013    Albertha Beattie, PTA 11/19/2015, 1:13 PM  [facility] [address] [place], [place], [address] Phone: [phone]   Fax:  [phone]  Name: Beth Simon MRN: [id number] Date of Birth: [birth year]

## 2015-11-22 ENCOUNTER — Encounter: Payer: Self-pay | Admitting: Physical Therapy

## 2015-11-22 ENCOUNTER — Ambulatory Visit: Payer: PPO | Admitting: Physical Therapy

## 2015-11-22 DIAGNOSIS — M6281 Muscle weakness (generalized): Secondary | ICD-10-CM | POA: Diagnosis not present

## 2015-11-22 DIAGNOSIS — M25551 Pain in right hip: Secondary | ICD-10-CM

## 2015-11-22 NOTE — Therapy (Signed)
[facility] [address] [place], [place], [address] Phone: [phone]   Fax:  [phone]  Physical Therapy Treatment  Patient Details  Name: Beth Simon MRN: [id number] Date of Birth: 12/31/1940 Referring Provider: Wandra Feinstein  Encounter Date: 11/22/2015      PT End of Session - 11/22/15 0954    Visit Number 4   Number of Visits 20   Date for PT Re-Evaluation 12/24/15   PT Start Time 0933   PT Stop Time 1015   PT Time Calculation (min) 42 min   Activity Tolerance Patient tolerated treatment well   Behavior During Therapy [facility] for tasks assessed/performed      Past Medical History:  Diagnosis Date  . Anemia   . Breast cancer ([place]) 1990   bilateral mastectomy, adenoca breast-left MRM, reconstruction, chemo  . Bronchitis last 2 weeks   saw dr Felipa Eth 02-28-2014, he said no antibiotics needed, nonproductive cough  . Complication of anesthesia   . Cystitis    cytoxen, had once or twice  . Family history of breast cancer   . History of breast cancer   . History of radiation therapy 2/10, 2/12, 2/18, 2/24, 05/29/14   vaginal cuff/ 30 Gy/ 5 fx  . Macular degeneration   . Neck pain    taking physictal therapy last 2 weeks  . Osteoporosis    on Evista  . PONV (postoperative nausea and vomiting)   . Shoulder pain    taking physical therapy for last few weeks  . Uterine cancer ([place]) 03/21/2014   MLH1/PMS2 LOH  . Uterine fibroid   . Vasovagal syncopes     Past Surgical History:  Procedure Laterality Date  . BREAST IMPLANT REMOVAL  6/94   left breast  . BREAST RECONSTRUCTION  1990   left  . CATARACT EXTRACTION Bilateral   . EXCISION VAGINAL CYST    . MASTECTOMY Right 10/08/06   prophylactic  . RADICAL MASTECTOMY LND  1990   left, chemo done  . ROBOTIC ASSISTED TOTAL HYSTERECTOMY WITH BILATERAL SALPINGO OOPHERECTOMY Bilateral 03/21/2014   Procedure: ROBOTIC ASSISTED TOTAL HYSTERECTOMY WITH  BILATERAL SALPINGO OOPHORECTOMY ;  Surgeon: Janie Morning, MD;  Location: [facility];  Service: Gynecology;  Laterality: Bilateral;    There were no vitals filed for this visit.      Subjective Assessment - 11/22/15 0936    Pertinent History injection Rt hip 8/4   Limitations Standing   How long can you stand comfortably? 30 minutes   How long can you walk comfortably? 10 minutes   Diagnostic tests MRI positive for bursitis   Patient Stated Goals Reduce Lt shoulder pain, return to using Lt UT, return to driving   Currently in Pain? Yes   Pain Score 2    Pain Location Hip   Pain Orientation Right   Pain Descriptors / Indicators Aching;Dull   Pain Frequency Constant   Aggravating Factors  Moving    Pain Relieving Factors Ice, rest   Multiple Pain Sites No                         [facility] Adult PT Treatment/Exercise - 11/22/15 0001      Knee/Hip Exercises: Stretches   Other Knee/Hip Stretches Hip ER release in supine  2 x30 sec   Other Knee/Hip Stretches RT knee to opposite shoulder 3x 20 sec     Knee/Hip Exercises: Aerobic   Nustep L1 x 6 min  While therapist collecting data     Knee/Hip Exercises: Standing   Hip Flexion AROM;Both;2 sets;10 reps   Hip Abduction Stengthening;Both;2 sets;10 reps   Hip Extension Stengthening;Both;2 sets;20 reps;Knee straight     Knee/Hip Exercises: Seated   Long Arc Quad AAROM;Strengthening;Both;2 sets;10 reps   [facility] 30   Clamshell with Thera[employer] Red     Knee/Hip Exercises: Supine   Straight Leg Raises Strengthening;Right;2 sets;10 reps  VC for core & eccentric lowering     Knee/Hip Exercises: Sidelying   Hip ABduction Strengthening;2 sets;10 reps     Iontophoresis   Type of Iontophoresis Dexamethasone   Location Rt lateral hip   Dose 1 ml   Time 6 hr wear  #4                  PT Short Term Goals - 11/19/15 1241      PT SHORT TERM GOAL #1   Title be independent in initial HEP   Time 3   Period  Weeks   Status Achieved           PT Long Term Goals - 11/15/15 1134      PT LONG TERM GOAL #1   Title be independent in advanced HEP   Status On-going     PT LONG TERM GOAL #2   Title reduce FOTO to < 38% to demo improved mobility   Status On-going     PT LONG TERM GOAL #3   Title 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 Continue to have limited external rotation in Rt hip but is compliant with stretching and home exrcises. Will continue to strengthen pelvis and increase tolerance for weight bearing exercises. Continue to increase flexibility. Ionoto #5   Rehab Potential Good   Clinical Impairments Affecting Rehab Potential None   PT Frequency 2x / week   PT Duration 6 weeks   PT Treatment/Interventions ADLs/Self Care Home Management;Cryotherapy;Electrical Stimulation;Iontophoresis 21m/ml Dexamethasone;Functional mobility training;Passive range of motion;Patient/family education;Gait training;Stair training;Ultrasound;Therapeutic activities;Moist Heat;Balance training;Neuromuscular re-education;Therapeutic exercise;Manual techniques;Dry needling;Taping   PT Next Visit Plan Ionto #4, hip strength and increase hip ER gently   PT Home Exercise Plan SLR all directions   Consulted and Agree with Plan of Care Patient      Patient will benefit from skilled therapeutic intervention in order to improve the following deficits and impairments:  Decreased activity tolerance, Pain, Decreased endurance, Difficulty walking, Decreased strength, Increased edema, Decreased balance  Visit Diagnosis: Muscle weakness (generalized)  Pain in right hip     Problem  List Patient Active Problem List   Diagnosis Date Noted  . Genetic testing 05/23/2014  . History of breast cancer   . Family history of breast cancer   . Endometrial cancer ([place] 03/21/2014  . [place]ine cancer ([place] 03/21/2014  . S/P mastectomy, bilateral 05/02/2013  . Situational stress 05/02/2013  . Osteoporosis, unspecified 05/02/2013  . Breast cancer, left breast ([place] 05/02/2013    KMikle BosworthPTA 11/22/2015, 10:19 AM  [facility] [address]  [place], [place], [address] Phone: [phone]   Fax:  [phone]  Name: Beth Simon MRN: [id number] Date of Birth: 07-13-1940

## 2015-11-22 NOTE — Patient Instructions (Signed)
Hip Extension (Standing)    Stand with support. Squeeze pelvic floor and hold. Move right leg backward with straight knee. Hold for ___ seconds. Relax for ___ seconds. Complete 2 sets of 10 reps 1 time per day   Copyright  VHI. All rights reserved.  ABDUCTION: Standing (Active)    Stand, feet flat. Lift right leg out to side. Use ___ lbs. Complete _2__ sets of _10__ repetitions. Perform __1_ sessions per day.  http://gtsc.exer.us/111   Copyright  VHI. All rights reserved.  Abduction Lift    Lie on residual limb side. Tighten muscles on outside of hip to raise sound limb ____ inches. Complete 2 sets of 10 reps 1 time per day  Copyright  VHI. All rights reserved.

## 2015-11-26 ENCOUNTER — Ambulatory Visit: Payer: PPO | Admitting: Physical Therapy

## 2015-11-26 ENCOUNTER — Encounter: Payer: Self-pay | Admitting: Physical Therapy

## 2015-11-26 DIAGNOSIS — M25551 Pain in right hip: Secondary | ICD-10-CM

## 2015-11-26 DIAGNOSIS — M6281 Muscle weakness (generalized): Secondary | ICD-10-CM

## 2015-11-26 NOTE — Therapy (Signed)
[facility] [address], [place], [place], [address] Phone: [phone]   Fax:  [phone]  Physical Therapy Treatment  Patient Details  Name: Beth Simon MRN: [id number] Date of Birth: [birth year] Referring Provider: Wandra Feinstein  Encounter Date: 11/26/2015      PT End of Session - 11/26/15 1155    Visit Number 5   Number of Visits 20   Date for PT Re-Evaluation 12/24/15   PT Start Time 6433   PT Stop Time 1255   PT Time Calculation (min) 62 min   Activity Tolerance Patient tolerated treatment well   Behavior During Therapy [facility] for tasks assessed/performed      Past Medical History:  Diagnosis Date  . Anemia   . Breast cancer ([place]) 1990   bilateral mastectomy, adenoca breast-left MRM, reconstruction, chemo  . Bronchitis last 2 weeks   saw dr Felipa Eth 02-28-2014, he said no antibiotics needed, nonproductive cough  . Complication of anesthesia   . Cystitis    cytoxen, had once or twice  . Family history of breast cancer   . History of breast cancer   . History of radiation therapy 2/10, 2/12, 2/18, 2/24, 05/29/14   vaginal cuff/ 30 Gy/ 5 fx  . Macular degeneration   . Neck pain    taking physictal therapy last 2 weeks  . Osteoporosis    on Evista  . PONV (postoperative nausea and vomiting)   . Shoulder pain    taking physical therapy for last few weeks  . Uterine cancer ([place]) 03/21/2014   MLH1/PMS2 LOH  . Uterine fibroid   . Vasovagal syncopes     Past Surgical History:  Procedure Laterality Date  . BREAST IMPLANT REMOVAL  6/94   left breast  . BREAST RECONSTRUCTION  1990   left  . CATARACT EXTRACTION Bilateral   . EXCISION VAGINAL CYST    . MASTECTOMY Right 10/08/06   prophylactic  . RADICAL MASTECTOMY LND  1990   left, chemo done  . ROBOTIC ASSISTED TOTAL HYSTERECTOMY WITH BILATERAL SALPINGO OOPHERECTOMY Bilateral 03/21/2014   Procedure: ROBOTIC ASSISTED TOTAL HYSTERECTOMY WITH  BILATERAL SALPINGO OOPHORECTOMY ;  Surgeon: Janie Morning, MD;  Location: [facility];  Service: Gynecology;  Laterality: Bilateral;    There were no vitals filed for this visit.      Subjective Assessment - 11/26/15 1154    Subjective My moment of breakthrough last week was only short lived.  I'm wondering if I am getting any better.    Currently in Pain? 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 Stimulation Location RT lateral hip  MHP to same area concurrent   Electrical Stimulation Action IFC   Electrical Stimulation Parameters 80-150 HZ   Electrical Stimulation Goals Pain     Iontophoresis   Type of Iontophoresis  Dexamethasone  #5 skin intact   Location Rt lateral hip   Dose 1 ml   Time 6 hr wear  #4     Manual Therapy   Soft tissue mobilization Rt glute medius and around greater trochanter                  PT Short Term Goals - 11/19/15 1241      PT SHORT TERM GOAL #1   Title be independent in initial HEP   Time 3   Period Weeks   Status Achieved           PT Long Term Goals - 11/15/15 1134      PT LONG TERM GOAL #1   Title be independent in advanced HEP   Status On-going     PT LONG TERM GOAL #2   Title reduce FOTO to < 38% to demo improved mobility   Status On-going     PT LONG TERM GOAL #3   Title 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 She thinks any signs of improvement have just been fleeting. her Rt glute medius is very thick and might benefit from dry needling.    Rehab Potential Good   Clinical Impairments Affecting Rehab Potential None   PT Frequency 2x / week   PT Duration 6 weeks   PT Treatment/Interventions ADLs/Self Care Home Management;Cryotherapy;Electrical Stimulation;Iontophoresis 4m/ml Dexamethasone;Functional mobility training;Passive range of motion;Patient/family education;Gait training;Stair training;Ultrasound;Therapeutic activities;Moist Heat;Balance training;Neuromuscular re-education;Therapeutic exercise;Manual techniques;Dry needling;Taping   PT Next Visit Plan Finish ionto, schedule dry needling session.    Consulted and Agree with Plan of Care Patient      Patient will benefit from skilled therapeutic intervention in order to improve the following deficits and impairments:  Decreased activity tolerance, Pain, Decreased endurance, Difficulty walking, Decreased strength, Increased edema, Decreased balance  Visit Diagnosis: Muscle weakness (generalized)  Pain in right hip     Problem List Patient Active Problem List   Diagnosis Date Noted  . Genetic testing 05/23/2014  . History of breast cancer   . Family history of breast cancer   . Endometrial cancer ([place] 03/21/2014  . Uterine cancer ([place] 03/21/2014  . S/P mastectomy, bilateral 05/02/2013  . Situational stress 05/02/2013  . Osteoporosis, unspecified 05/02/2013  . Breast cancer, left breast ([place] 05/02/2013    ,, PTA 11/26/2015, 12:29 PM  [facility] [address] [place] [address]: [phone]  Fax:  [phone] Name: Beth LUALLENMRN:  [id number] of Birth: [birth year]

## 2015-11-28 ENCOUNTER — Encounter: Payer: PPO | Admitting: Physical Therapy

## 2015-11-29 ENCOUNTER — Ambulatory Visit: Payer: PPO | Admitting: Physical Therapy

## 2015-11-29 DIAGNOSIS — M25551 Pain in right hip: Secondary | ICD-10-CM

## 2015-11-29 DIAGNOSIS — M6281 Muscle weakness (generalized): Secondary | ICD-10-CM

## 2015-11-29 NOTE — Patient Instructions (Signed)
     Trigger Point Dry Needling  . What is Trigger Point Dry Needling (DN)? o DN is a physical therapy technique used to treat muscle pain and dysfunction. Specifically, DN helps deactivate muscle trigger points (muscle knots).  o A thin filiform needle is used to penetrate the skin and stimulate the underlying trigger point. The goal is for a local twitch response (LTR) to occur and for the trigger point to relax. No medication of any kind is injected during the procedure.   . What Does Trigger Point Dry Needling Feel Like?  o The procedure feels different for each individual patient. Some patients report that they do not actually feel the needle enter the skin and overall the process is not painful. Very mild bleeding may occur. However, many patients feel a deep cramping in the muscle in which the needle was inserted. This is the local twitch response.   . How Will I feel after the treatment? o Soreness is normal, and the onset of soreness may not occur for a few hours. Typically this soreness does not last longer than two days.  o Bruising is uncommon, however; ice can be used to decrease any possible bruising.  o In rare cases feeling tired or nauseous after the treatment is normal. In addition, your symptoms may get worse before they get better, this period will typically not last longer than 24 hours.   . What Can I do After My Treatment? o Increase your hydration by drinking more water for the next 24 hours. o You may place ice or heat on the areas treated that have become sore, however, do not use heat on inflamed or bruised areas. Heat often brings more relief post needling. o You can continue your regular activities, but vigorous activity is not recommended initially after the treatment for 24 hours. o DN is best combined with other physical therapy such as strengthening, stretching, and other therapies.    Kasaundra Fahrney PT [facility] [address], [address] [place], [place] [address] Phone # [phone] Fax [phone] 

## 2015-11-29 NOTE — Therapy (Signed)
[facility] [address] [place], [place], [address] Phone: [phone]   Fax:  [phone]  Physical Therapy Treatment  Patient Details  Name: Beth Simon MRN: [id number] Date of Birth: 20-Oct-1940 Referring Provider: Albertha Ghee  Encounter Date: 11/29/2015      PT End of Session - 11/29/15 1914    Visit Number 6   Number of Visits 20   Date for PT Re-Evaluation 12/24/15   PT Start Time 1145   PT Stop Time 1245   PT Time Calculation (min) 60 min   Activity Tolerance Patient tolerated treatment well      Past Medical History:  Diagnosis Date  . Anemia   . Breast cancer ([facility]) 1990   bilateral mastectomy, adenoca breast-left MRM, reconstruction, chemo  . Bronchitis last 2 weeks   saw dr Pete Glatter 02-28-2014, he said no antibiotics needed, nonproductive cough  . Complication of anesthesia   . Cystitis    cytoxen, had once or twice  . Family history of breast cancer   . History of breast cancer   . History of radiation therapy 2/10, 2/12, 2/18, 2/24, 05/29/14   vaginal cuff/ 30 Gy/ 5 fx  . Macular degeneration   . Neck pain    taking physictal therapy last 2 weeks  . Osteoporosis    on Evista  . PONV (postoperative nausea and vomiting)   . Shoulder pain    taking physical therapy for last few weeks  . Uterine cancer ([facility]) 03/21/2014   MLH1/PMS2 LOH  . Uterine fibroid   . Vasovagal syncopes     Past Surgical History:  Procedure Laterality Date  . BREAST IMPLANT REMOVAL  6/94   left breast  . BREAST RECONSTRUCTION  1990   left  . CATARACT EXTRACTION Bilateral   . EXCISION VAGINAL CYST    . MASTECTOMY Right 10/08/06   prophylactic  . RADICAL MASTECTOMY LND  1990   left, chemo done  . ROBOTIC ASSISTED TOTAL HYSTERECTOMY WITH BILATERAL SALPINGO OOPHERECTOMY Bilateral 03/21/2014   Procedure: ROBOTIC ASSISTED TOTAL HYSTERECTOMY WITH BILATERAL SALPINGO OOPHORECTOMY ;  Surgeon: Laurette Schimke,  MD;  Location: [facility];  Service: Gynecology;  Laterality: Bilateral;    There were no vitals filed for this visit.      Subjective Assessment - 11/29/15 1151    Subjective (P)  Patient reports an itchy rash on right hip which she attributes to ionto patch (will discontinue).    Good relief "a good hurt" after manual therapy last visit.     Currently in Pain? (P)  Yes   Pain Score (P)  2    Pain Location (P)  Hip   Pain Orientation (P)  Right   Pain Descriptors / Indicators (P)  Nagging   Pain Frequency (P)  Constant                         [facility] Adult PT Treatment/Exercise - 11/29/15 0001      Knee/Hip Exercises: Stretches   Other Knee/Hip Stretches RT knee to opposite shoulder 3x 20 sec     Electrical Stimulation   Electrical Stimulation Location RT lateral hip  MHP to same area concurrent   Electrical Stimulation Action IFC   Electrical Stimulation Parameters 10 ma 15 min   Electrical Stimulation Goals Pain     Manual Therapy   Manual Therapy Muscle Energy Technique   Joint Mobilization right long axis distraction, inferior mobs, PA in  IR grade 3 3x 30 sec   Soft tissue mobilization Rt glute medius   Muscle Energy Technique piriformis contract relax 3x 5 sec          Trigger Point Dry Needling - 11/29/15 1913    Consent Given? Yes   Education Handout Provided Yes   Muscles Treated Lower Body Gluteus minimus;Gluteus maximus;Piriformis;Tensor fascia lata   Gluteus Maximus Response Twitch response elicited;Palpable increased muscle length   Gluteus Minimus Response Palpable increased muscle length   Piriformis Response Palpable increased muscle length   Tensor Fascia Lata Response Palpable increased muscle length              PT Education - 11/29/15 1228    Education provided Yes   Education Details dry needling after care;  review/hightlight of HEP   Person(s) Educated Patient   Methods Explanation;Demonstration;Handout   Comprehension  Verbalized understanding;Returned demonstration          PT Short Term Goals - 11/29/15 1917      PT SHORT TERM GOAL #1   Title be independent in initial HEP   Status Achieved     PT SHORT TERM GOAL #2   Title Pt will demo Rt LE strength 4/5 to improve ability to perform home and recreation activities with decreased pain   Time 3   Period Weeks   Status On-going     PT SHORT TERM GOAL #3   Title report < or = to 5/10 Lt shoulder pain with use    Status Achieved     PT SHORT TERM GOAL #4   Title wean from sling and report > or = to 50% use of Lt UE with ADLs and self-care   Status Achieved           PT Long Term Goals - 11/29/15 1918      PT LONG TERM GOAL #1   Title be independent in advanced HEP   Time 6   Period Weeks   Status On-going     PT LONG TERM GOAL #2   Title reduce FOTO to < 38% to demo improved mobility   Time 6   Period Weeks   Status On-going     PT LONG TERM GOAL #3   Title 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 with pain less than 2/10   Time 6   Period Weeks   Status On-going     PT LONG TERM GOAL #4   Title pt will demo bilat LE strength 4+/5 to improve endurance with standing and walking activities   Time 6   Period Weeks   Status On-going     PT LONG TERM GOAL #5   Title increase Lt shoulder strength to improve use of Lt UE to > or = to 75%    Status Achieved               Plan - 11/29/15 1914    Clinical Impression Statement The patient reports slow progress overall and is receptive to try dry needling to address myofascial pain and tender points in right gluteals, piriformis and tensor fascia lata.  Improved muscle length noted following treatment session and decreased tender points noted.  Good pain relief with e-stim/heat.  Therapist closely monitoring response with all interventions.     PT Next Visit Plan Discontinue ionto secondary to skin irritation/rash;  assess response to DN #1 and continue as needed;  hip joint  mobs and soft tissue;  progress gluteus  medius ex;  e-stim/heat as needed      Patient will benefit from skilled therapeutic intervention in order to improve the following deficits and impairments:     Visit Diagnosis: Muscle weakness (generalized)  Pain in right hip     Problem List Patient Active Problem List   Diagnosis Date Noted  . Genetic testing 05/23/2014  . History of breast cancer   . Family history of breast cancer   . Endometrial cancer ([place]) 03/21/2014  . Uterine cancer ([place]) 03/21/2014  . S/P mastectomy, bilateral 05/02/2013  . Situational stress 05/02/2013  . Osteoporosis, unspecified 05/02/2013  . Breast cancer, left breast ([place]) 05/02/2013   Ruben Im, PT 11/29/15 7:21 PM Phone: [phone] Fax: [phone]  Alvera Singh 11/29/2015, 7:20 PM  [facility] [address], [place], [place], [address] Phone: [phone]   Fax:  [phone]  Name: PINKI ROTTMAN MRN: [id number] Date of Birth: 01/18/1941

## 2015-11-30 ENCOUNTER — Ambulatory Visit (INDEPENDENT_AMBULATORY_CARE_PROVIDER_SITE_OTHER): Payer: PPO | Admitting: Podiatry

## 2015-11-30 ENCOUNTER — Encounter: Payer: Self-pay | Admitting: Podiatry

## 2015-11-30 DIAGNOSIS — L84 Corns and callosities: Secondary | ICD-10-CM

## 2015-11-30 DIAGNOSIS — M79676 Pain in unspecified toe(s): Secondary | ICD-10-CM

## 2015-11-30 DIAGNOSIS — B351 Tinea unguium: Secondary | ICD-10-CM

## 2015-12-04 ENCOUNTER — Ambulatory Visit: Payer: PPO | Attending: Sports Medicine | Admitting: Physical Therapy

## 2015-12-04 ENCOUNTER — Encounter: Payer: Self-pay | Admitting: Physical Therapy

## 2015-12-04 DIAGNOSIS — M25551 Pain in right hip: Secondary | ICD-10-CM | POA: Diagnosis not present

## 2015-12-04 DIAGNOSIS — M6281 Muscle weakness (generalized): Secondary | ICD-10-CM | POA: Diagnosis not present

## 2015-12-04 DIAGNOSIS — M25612 Stiffness of left shoulder, not elsewhere classified: Secondary | ICD-10-CM | POA: Insufficient documentation

## 2015-12-04 NOTE — Therapy (Signed)
[facility] [address], [place], [place], [address] Phone: [phone]   Fax:  [phone]  Physical Therapy Treatment  Patient Details  Name: Beth Simon MRN: [id number] Date of Birth: 1940-07-30 Referring Provider: Wandra Feinstein  Encounter Date: 12/04/2015      PT End of Session - 12/04/15 1706    Visit Number 7   Number of Visits 20   Date for PT Re-Evaluation 12/24/15   PT Start Time 7544   PT Stop Time 1632   PT Time Calculation (min) 53 min   Activity Tolerance Patient tolerated treatment well   Behavior During Therapy [facility] for tasks assessed/performed      Past Medical History:  Diagnosis Date  . Anemia   . Breast cancer ([place]) 1990   bilateral mastectomy, adenoca breast-left MRM, reconstruction, chemo  . Bronchitis last 2 weeks   saw dr Felipa Eth 02-28-2014, he said no antibiotics needed, nonproductive cough  . Complication of anesthesia   . Cystitis    cytoxen, had once or twice  . Family history of breast cancer   . History of breast cancer   . History of radiation therapy 2/10, 2/12, 2/18, 2/24, 05/29/14   vaginal cuff/ 30 Gy/ 5 fx  . Macular degeneration   . Neck pain    taking physictal therapy last 2 weeks  . Osteoporosis    on Evista  . PONV (postoperative nausea and vomiting)   . Shoulder pain    taking physical therapy for last few weeks  . Uterine cancer ([place]) 03/21/2014   MLH1/PMS2 LOH  . Uterine fibroid   . Vasovagal syncopes     Past Surgical History:  Procedure Laterality Date  . BREAST IMPLANT REMOVAL  6/94   left breast  . BREAST RECONSTRUCTION  1990   left  . CATARACT EXTRACTION Bilateral   . EXCISION VAGINAL CYST    . MASTECTOMY Right 10/08/06   prophylactic  . RADICAL MASTECTOMY LND  1990   left, chemo done  . ROBOTIC ASSISTED TOTAL HYSTERECTOMY WITH BILATERAL SALPINGO OOPHERECTOMY Bilateral 03/21/2014   Procedure: ROBOTIC ASSISTED TOTAL HYSTERECTOMY WITH  BILATERAL SALPINGO OOPHORECTOMY ;  Surgeon: Janie Morning, MD;  Location: [facility];  Service: Gynecology;  Laterality: Bilateral;    There were no vitals filed for this visit.      Subjective Assessment - 12/04/15 1541    Pertinent History injection Rt hip 8/4   Limitations Standing   How long can you stand comfortably? 30 minutes   How long can you walk comfortably? 10 minutes   Diagnostic tests MRI positive for bursitis   Patient Stated Goals Reduce Lt shoulder pain, return to using Lt UT, return to driving   Currently in Pain? Yes   Pain Score 2    Pain Location Hip   Pain Orientation Right   Pain Descriptors / Indicators Aching;Dull   Pain Type Chronic pain   Pain Onset More than a month ago   Pain Frequency Constant   Aggravating Factors  Moving   Pain Relieving Factors Ice, rest   Multiple Pain Sites No                         [facility] Adult PT Treatment/Exercise - 12/04/15 0001      Knee/Hip Exercises: Stretches   Piriformis Stretch Right;2 reps  release with orange ball   Other Knee/Hip Stretches RT knee to opposite shoulder 3x 20 sec  Knee/Hip Exercises: Aerobic   Nustep L1 x 6 min  While therapist collecting data     Knee/Hip Exercises: Standing   Hip Abduction AROM;Stengthening;Both;2 sets;10 reps   Hip Extension AROM;Stengthening;Both;2 sets;10 reps     Knee/Hip Exercises: Supine   Straight Leg Raises AROM;Strengthening;Right;1 set;10 reps   Other Supine Knee/Hip Exercises Ball sqeezes with abdominal bracing     Manual Therapy   Manual Therapy Soft tissue mobilization   Joint Mobilization Rt gluteals, IT band, piriformis                  PT Short Term Goals - 11/29/15 1917      PT SHORT TERM GOAL #1   Title be independent in initial HEP   Status Achieved     PT SHORT TERM GOAL #2   Title Pt will demo Rt LE strength 4/5 to improve ability to perform home and recreation activities with decreased pain   Time 3   Period  Weeks   Status On-going     PT SHORT TERM GOAL #3   Title report < or = to 5/10 Lt shoulder pain with use    Status Achieved     PT SHORT TERM GOAL #4   Title wean from sling and report > or = to 50% use of Lt UE with ADLs and self-care   Status Achieved           PT Long Term Goals - 11/29/15 1918      PT LONG TERM GOAL #1   Title be independent in advanced HEP   Time 6   Period Weeks   Status On-going     PT LONG TERM GOAL #2   Title reduce FOTO to < 38% to demo improved mobility   Time 6   Period Weeks   Status On-going     PT LONG TERM GOAL #3   Title 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 with pain less than 2/10   Time 6   Period Weeks   Status On-going     PT LONG TERM GOAL #4   Title pt will demo bilat LE strength 4+/5 to improve endurance with standing and walking activities   Time 6   Period Weeks   Status On-going     PT LONG TERM GOAL #5   Title increase Lt shoulder strength to improve use of Lt UE to > or = to 75%    Status Achieved               Plan - 12/04/15 1708    Clinical Impression Statement Pt reports having success with dry needleing for the first couple of days but tightness came back. Able to tolerate all exercises well. Some trigger points in Rt piriformis decreased with manual. Continue to increase hip strength and stability.    Rehab Potential Good   Clinical Impairments Affecting Rehab Potential None   PT Frequency 2x / week   PT Duration 6 weeks   PT Treatment/Interventions ADLs/Self Care Home Management;Cryotherapy;Electrical Stimulation;Iontophoresis 13m/ml Dexamethasone;Functional mobility training;Passive range of motion;Patient/family education;Gait training;Stair training;Ultrasound;Therapeutic activities;Moist Heat;Balance training;Neuromuscular re-education;Therapeutic exercise;Manual techniques;Dry needling;Taping   PT Next Visit Plan Discontinue ionto secondary to skin irritation/rash;  continue as needed DN;  hip  joint mobs and soft tissue;  progress gluteus medius ex;  e-stim/heat as needed   PT Home Exercise Plan SLR all directions   Consulted and Agree with Plan of Care Patient      Patient  will benefit from skilled therapeutic intervention in order to improve the following deficits and impairments:  Decreased activity tolerance, Pain, Decreased endurance, Difficulty walking, Decreased strength, Increased edema, Decreased balance  Visit Diagnosis: Muscle weakness (generalized)  Pain in right hip  Stiffness of left shoulder, not elsewhere classified     Problem List Patient Active Problem List   Diagnosis Date Noted  . Genetic testing 05/23/2014  . History of breast cancer   . Family history of breast cancer   . Endometrial cancer ([place]) 03/21/2014  . Uterine cancer ([place]) 03/21/2014  . S/P mastectomy, bilateral 05/02/2013  . Situational stress 05/02/2013  . Osteoporosis, unspecified 05/02/2013  . Breast cancer, left breast ([place]) 05/02/2013    Mikle Bosworth PTA 12/04/2015, 5:12 PM  [facility] [address] [place], [place], [address] Phone: [phone]   Fax:  [phone]  Name: Beth Simon MRN: [id number] Date of Birth: 23-Aug-1940

## 2015-12-04 NOTE — Progress Notes (Signed)
Patient ID: Beth Simon, female   DOB: 1941-02-08, 75 y.o.   MRN: [id number]  Subjective: 75 y.o. returns the office today for painful, elongated, thickened toenails which she is unable to trim herself. Denies any redness or drainage around the nails. Denies any swelling redness or drainage. Denies any acute changes since last appointment and no new complaints today. Denies any systemic complaints such as fevers, chills, nausea, vomiting.   Objective: AAO 3, NAD DP/PT pulses palpable, CRT less than 3 seconds Nails hypertrophic, dystrophic, elongated, brittle, discolored 10. There is tenderness overlying the nails 1-5 bilaterally. There is no surrounding erythema or drainage along the nail sites. Hyperkeratotic lesions of the distal aspect left second digit. Upon debridement there is no underlying ulceration, drainage or other signs of infection. No open lesions or pre-ulcerative lesions are identified. No other areas of tenderness bilateral lower extremities. No overlying edema, erythema, increased warmth. Hammertoe contractures bilaerally Bilateral lower extremity edema around the ankles. No pain with calf compression, swelling, warmth, erythema. Exam appears to be mostly unchanged  Assessment: Patient presents with symptomatic onychomycosis; calluses   Plan: -Treatment options including alternatives, risks, complications were discussed -Nails sharply debrided 10 without complication/bleeding. -Hyperkeratotic lesion sharply debrided 1 without complication/bleeding. -Discussed daily foot inspection. If there are any changes, to call the office immediately.  -Follow-up in 3 months or sooner if any problems are to arise. In the meantime, encouraged to call the office with any questions, concerns, changes symptoms.  Celesta Gentile, DPM

## 2015-12-06 ENCOUNTER — Encounter: Payer: PPO | Admitting: Physical Therapy

## 2015-12-07 ENCOUNTER — Ambulatory Visit: Payer: PPO | Admitting: Physical Therapy

## 2015-12-07 DIAGNOSIS — M6281 Muscle weakness (generalized): Secondary | ICD-10-CM

## 2015-12-07 DIAGNOSIS — M25551 Pain in right hip: Secondary | ICD-10-CM

## 2015-12-07 NOTE — Therapy (Signed)
[facility] [address], [place], [place], [address] Phone: [phone]   Fax:  [phone]  Physical Therapy Treatment  Patient Details  Name: Beth Simon MRN: [id number] Date of Birth: 07/29/1940 Referring Provider: Wandra Feinstein  Encounter Date: 12/07/2015      PT End of Session - 12/07/15 0854    Visit Number 8   Number of Visits 20   Date for PT Re-Evaluation 12/24/15   PT Start Time 0807   PT Stop Time 0905   PT Time Calculation (min) 58 min   Activity Tolerance Patient tolerated treatment well      Past Medical History:  Diagnosis Date  . Anemia   . Breast cancer ([place]) 1990   bilateral mastectomy, adenoca breast-left MRM, reconstruction, chemo  . Bronchitis last 2 weeks   saw dr Felipa Eth 02-28-2014, he said no antibiotics needed, nonproductive cough  . Complication of anesthesia   . Cystitis    cytoxen, had once or twice  . Family history of breast cancer   . History of breast cancer   . History of radiation therapy 2/10, 2/12, 2/18, 2/24, 05/29/14   vaginal cuff/ 30 Gy/ 5 fx  . Macular degeneration   . Neck pain    taking physictal therapy last 2 weeks  . Osteoporosis    on Evista  . PONV (postoperative nausea and vomiting)   . Shoulder pain    taking physical therapy for last few weeks  . Uterine cancer ([place]) 03/21/2014   MLH1/PMS2 LOH  . Uterine fibroid   . Vasovagal syncopes     Past Surgical History:  Procedure Laterality Date  . BREAST IMPLANT REMOVAL  6/94   left breast  . BREAST RECONSTRUCTION  1990   left  . CATARACT EXTRACTION Bilateral   . EXCISION VAGINAL CYST    . MASTECTOMY Right 10/08/06   prophylactic  . RADICAL MASTECTOMY LND  1990   left, chemo done  . ROBOTIC ASSISTED TOTAL HYSTERECTOMY WITH BILATERAL SALPINGO OOPHERECTOMY Bilateral 03/21/2014   Procedure: ROBOTIC ASSISTED TOTAL HYSTERECTOMY WITH BILATERAL SALPINGO OOPHORECTOMY ;  Surgeon: Janie Morning, MD;   Location: [facility];  Service: Gynecology;  Laterality: Bilateral;    There were no vitals filed for this visit.      Subjective Assessment - 12/07/15 0810    Subjective The soreness is still there all the time but the stiffness may be going away.  Right buttock pain.  Right anterior/lateral thigh pain with right sidelying.   Low grade soreness but it is constant.  Patient reports she feels dry needling was very helpful.     Currently in Pain? Yes   Pain Score 2    Pain Location Hip   Pain Orientation Right   Pain Type Chronic pain   Pain Frequency Constant                         [facility] Adult PT Treatment/Exercise - 12/07/15 0001      Knee/Hip Exercises: Stretches   Other Knee/Hip Stretches psoas doorway stretch 3x 5 right and left with UE movements     Knee/Hip Exercises: Standing   Hip Abduction AROM;Stengthening;Both;2 sets;10 reps   Other Standing Knee Exercises single leg standing with cues to avoid pelvic drop 4x     Electrical Stimulation   Electrical Stimulation Location RT lateral hip  MHP to same area concurrent   Electrical Stimulation Action IFC   Electrical Stimulation Parameters  14 ma 15 min   Electrical Stimulation Goals Pain     Manual Therapy   Joint Mobilization right long axis distraction, inferior mobs, PA in IR grade 3 3x 30 sec   Soft tissue mobilization Rt glute medius, ITB, piriformis, glut max   Myofascial Release sidelying pelvic distraction and neutral gapping grade 3 20sec each 3x   Muscle Energy Technique piriformis contract relax 3x 5 sec          Trigger Point Dry Needling - 12/07/15 0854    Gluteus Maximus Response Twitch response elicited;Palpable increased muscle length   Gluteus Minimus Response Palpable increased muscle length   Piriformis Response Twitch response elicited;Palpable increased muscle length   Tensor Fascia Lata Response Palpable increased muscle length                PT Short Term Goals -  12/07/15 0901      PT SHORT TERM GOAL #1   Title be independent in initial HEP   Status Achieved     PT SHORT TERM GOAL #2   Title Pt will demo Rt LE strength 4/5 to improve ability to perform home and recreation activities with decreased pain   Time 3   Period Weeks   Status On-going     PT SHORT TERM GOAL #3   Title report < or = to 5/10 Lt shoulder pain with use    Status Achieved     PT SHORT TERM GOAL #4   Title wean from sling and report > or = to 50% use of Lt UE with ADLs and self-care   Status Achieved           PT Long Term Goals - 12/07/15 0902      PT LONG TERM GOAL #1   Title be independent in advanced HEP   Time 6   Period Weeks   Status On-going     PT LONG TERM GOAL #2   Title reduce FOTO to < 38% to demo improved mobility   Time 6   Period Weeks   Status On-going     PT LONG TERM GOAL #3   Title 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 with pain less than 2/10   Time 6   Period Weeks   Status On-going     PT LONG TERM GOAL #4   Title pt will demo bilat LE strength 4+/5 to improve endurance with standing and walking activities   Time 6   Period Weeks   Status On-going     PT LONG TERM GOAL #5   Title increase Lt shoulder strength to improve use of Lt UE to > or = to 75%    Status Achieved               Plan - 12/07/15 0855    Clinical Impression Statement The patient reports good relief from dry needling #1 but continues to have constant right buttock soreness.  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  Pain and compensatory trunk movements with single standing on right.  Tender points in right gluteals and piriformis with twitch responses produced (good prognostic indicator).  Improved soft tissue length needed for joint mobilization.  Therapist closely monitoring response with all treatment interventions.     PT Next Visit Plan Discontinue  ionto secondary to skin irritation/rash; assess response to DN#2 and continue as needed;  hip joint mobs and soft tissue;  progress gluteus medius ex;  e-stim/heat as needed      Patient will benefit from skilled therapeutic intervention in order to improve the following deficits and impairments:     Visit Diagnosis: Muscle weakness (generalized)  Pain in right hip     Problem List Patient Active Problem List   Diagnosis Date Noted  . Genetic testing 05/23/2014  . History of breast cancer   . Family history of breast cancer   . Endometrial cancer ([place]) 03/21/2014  . Uterine cancer ([place]) 03/21/2014  . S/P mastectomy, bilateral 05/02/2013  . Situational stress 05/02/2013  . Osteoporosis, unspecified 05/02/2013  . Breast cancer, left breast ([place]) 05/02/2013   Ruben Im, PT 12/07/15 9:08 AM Phone: [phone] Fax: [phone]  Alvera Singh 12/07/2015, 9:04 AM  [facility] [address], [place], [place], [address] Phone: [phone]   Fax:  [phone]  Name: Beth Simon MRN: [id number] Date of Birth: 1940/11/30

## 2015-12-10 ENCOUNTER — Encounter: Payer: Self-pay | Admitting: Physical Therapy

## 2015-12-10 ENCOUNTER — Ambulatory Visit: Payer: PPO | Admitting: Physical Therapy

## 2015-12-10 DIAGNOSIS — M6281 Muscle weakness (generalized): Secondary | ICD-10-CM | POA: Diagnosis not present

## 2015-12-10 DIAGNOSIS — M25551 Pain in right hip: Secondary | ICD-10-CM

## 2015-12-10 NOTE — Therapy (Signed)
[facility] [address] [place], [place], [address] Phone: [phone]   Fax:  [phone]  Physical Therapy Treatment  Patient Details  Name: Beth Simon MRN: [id number] Date of Birth: 1941-02-05 Referring Provider: Wandra Feinstein  Encounter Date: 12/10/2015      PT End of Session - 12/10/15 1245    Visit Number 9   Number of Visits 20   Date for PT Re-Evaluation 12/24/15   PT Start Time 5697   PT Stop Time 1325   PT Time Calculation (min) 50 min   Activity Tolerance Patient tolerated treatment well   Behavior During Therapy [facility] for tasks assessed/performed      Past Medical History:  Diagnosis Date  . Anemia   . Breast cancer ([place]) 1990   bilateral mastectomy, adenoca breast-left MRM, reconstruction, chemo  . Bronchitis last 2 weeks   saw dr Felipa Eth 02-28-2014, he said no antibiotics needed, nonproductive cough  . Complication of anesthesia   . Cystitis    cytoxen, had once or twice  . Family history of breast cancer   . History of breast cancer   . History of radiation therapy 2/10, 2/12, 2/18, 2/24, 05/29/14   vaginal cuff/ 30 Gy/ 5 fx  . Macular degeneration   . Neck pain    taking physictal therapy last 2 weeks  . Osteoporosis    on Evista  . PONV (postoperative nausea and vomiting)   . Shoulder pain    taking physical therapy for last few weeks  . Uterine cancer ([place]) 03/21/2014   MLH1/PMS2 LOH  . Uterine fibroid   . Vasovagal syncopes     Past Surgical History:  Procedure Laterality Date  . BREAST IMPLANT REMOVAL  6/94   left breast  . BREAST RECONSTRUCTION  1990   left  . CATARACT EXTRACTION Bilateral   . EXCISION VAGINAL CYST    . MASTECTOMY Right 10/08/06   prophylactic  . RADICAL MASTECTOMY LND  1990   left, chemo done  . ROBOTIC ASSISTED TOTAL HYSTERECTOMY WITH BILATERAL SALPINGO OOPHERECTOMY Bilateral 03/21/2014   Procedure: ROBOTIC ASSISTED TOTAL HYSTERECTOMY WITH  BILATERAL SALPINGO OOPHORECTOMY ;  Surgeon: Janie Morning, MD;  Location: [facility];  Service: Gynecology;  Laterality: Bilateral;    There were no vitals filed for this visit.      Subjective Assessment - 12/10/15 1247    Currently in Pain? Yes   Pain Score 2    Pain Location Hip   Pain Orientation Right   Pain Descriptors / Indicators Aching;Dull   Aggravating Factors  Moving around   Pain Relieving Factors Dry Needling, massage   Multiple Pain Sites No                         [facility] Adult PT Treatment/Exercise - 12/10/15 0001      Knee/Hip Exercises: Aerobic   Nustep L1 x 10 min     Electrical Stimulation   Electrical Stimulation Location RT lateral hip  MHP to same area concurrent   Electrical Stimulation Action IFC   Electrical Stimulation Goals Pain     Manual Therapy   Manual Therapy Soft tissue mobilization   Soft tissue mobilization Rt glute medius, around the greater trochanter area, piriformis                  PT Short Term Goals - 12/07/15 0901      PT SHORT TERM GOAL #1  Title be independent in initial HEP   Status Achieved     PT SHORT TERM GOAL #2   Title Pt will demo Rt LE strength 4/5 to improve ability to perform home and recreation activities with decreased pain   Time 3   Period Weeks   Status On-going     PT SHORT TERM GOAL #3   Title report < or = to 5/10 Lt shoulder pain with use    Status Achieved     PT SHORT TERM GOAL #4   Title wean from sling and report > or = to 50% use of Lt UE with ADLs and self-care   Status Achieved           PT Long Term Goals - 12/07/15 0902      PT LONG TERM GOAL #1   Title be independent in advanced HEP   Time 6   Period Weeks   Status On-going     PT LONG TERM GOAL #2   Title reduce FOTO to < 38% to demo improved mobility   Time 6   Period Weeks   Status On-going     PT LONG TERM GOAL #3   Title 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 with pain less than 2/10    Time 6   Period Weeks   Status On-going     PT LONG TERM GOAL #4   Title pt will demo bilat LE strength 4+/5 to improve endurance with standing and walking activities   Time 6   Period Weeks   Status On-going     PT LONG TERM GOAL #5   Title increase Lt shoulder strength to improve use of Lt UE to > or = to 75%    Status Achieved               Plan - 12/10/15 1245    Clinical Impression Statement Pt still unsure of her progress as she feels really good aftter PT, only to have her pain come back in a few days. She reports the dry needling has given her the longest relief. Her glute medius and piriformis were very tender today.    Rehab Potential Good   Clinical Impairments Affecting Rehab Potential None   PT Frequency 2x / week   PT Duration 6 weeks   PT Treatment/Interventions ADLs/Self Care Home Management;Cryotherapy;Electrical Stimulation;Iontophoresis '4mg'$ /ml Dexamethasone;Functional mobility training;Passive range of motion;Patient/family education;Gait training;Stair training;Ultrasound;Therapeutic activities;Moist Heat;Balance training;Neuromuscular re-education;Therapeutic exercise;Manual techniques;Dry needling;Taping   PT Next Visit Plan Continue with soft tissue work to lateral hip, DN #3 when scheduled.   Consulted and Agree with Plan of Care Patient      Patient will benefit from skilled therapeutic intervention in order to improve the following deficits and impairments:  Decreased activity tolerance, Pain, Decreased endurance, Difficulty walking, Decreased strength, Increased edema, Decreased balance  Visit Diagnosis: Muscle weakness (generalized)  Pain in right hip     Problem List Patient Active Problem List   Diagnosis Date Noted  . Genetic testing 05/23/2014  . History of breast cancer   . Family history of breast cancer   . Endometrial cancer ([place]) 03/21/2014  . Uterine cancer ([place]) 03/21/2014  . S/P mastectomy, bilateral 05/02/2013  .  Situational stress 05/02/2013  . Osteoporosis, unspecified 05/02/2013  . Breast cancer, left breast ([place]) 05/02/2013    Sylvia Helms, PTA 12/10/2015, 1:16 PM  [facility] [address] [place], [place], [address] Phone: [phone]  Fax:  [phone]  Name: KALISE FICKETT MRN: [id number] Date of Birth: 10/04/1940

## 2015-12-12 ENCOUNTER — Ambulatory Visit: Payer: PPO

## 2015-12-12 DIAGNOSIS — M6281 Muscle weakness (generalized): Secondary | ICD-10-CM | POA: Diagnosis not present

## 2015-12-12 DIAGNOSIS — M25551 Pain in right hip: Secondary | ICD-10-CM

## 2015-12-12 NOTE — Therapy (Signed)
[facility] [address], [place], [place], [address] Phone: [phone]   Fax:  [phone]  Physical Therapy Treatment  Patient Details  Name: Beth Simon MRN: [id number] Date of Birth: March 09, 1941 Referring Provider: Wandra Feinstein  Encounter Date: 12/12/2015      PT End of Session - 12/12/15 1055    Visit Number 10   Number of Visits 20   Date for PT Re-Evaluation 12/24/15   PT Start Time 1020  late and dry needling   PT Stop Time 1110   PT Time Calculation (min) 50 min   Activity Tolerance Patient tolerated treatment well   Behavior During Therapy [facility] for tasks assessed/performed      Past Medical History:  Diagnosis Date  . Anemia   . Breast cancer ([place]) 1990   bilateral mastectomy, adenoca breast-left MRM, reconstruction, chemo  . Bronchitis last 2 weeks   saw dr Felipa Eth 02-28-2014, he said no antibiotics needed, nonproductive cough  . Complication of anesthesia   . Cystitis    cytoxen, had once or twice  . Family history of breast cancer   . History of breast cancer   . History of radiation therapy 2/10, 2/12, 2/18, 2/24, 05/29/14   vaginal cuff/ 30 Gy/ 5 fx  . Macular degeneration   . Neck pain    taking physictal therapy last 2 weeks  . Osteoporosis    on Evista  . PONV (postoperative nausea and vomiting)   . Shoulder pain    taking physical therapy for last few weeks  . Uterine cancer ([place]) 03/21/2014   MLH1/PMS2 LOH  . Uterine fibroid   . Vasovagal syncopes     Past Surgical History:  Procedure Laterality Date  . BREAST IMPLANT REMOVAL  6/94   left breast  . BREAST RECONSTRUCTION  1990   left  . CATARACT EXTRACTION Bilateral   . EXCISION VAGINAL CYST    . MASTECTOMY Right 10/08/06   prophylactic  . RADICAL MASTECTOMY LND  1990   left, chemo done  . ROBOTIC ASSISTED TOTAL HYSTERECTOMY WITH BILATERAL SALPINGO OOPHERECTOMY Bilateral 03/21/2014   Procedure: ROBOTIC ASSISTED  TOTAL HYSTERECTOMY WITH BILATERAL SALPINGO OOPHORECTOMY ;  Surgeon: Janie Morning, MD;  Location: [facility];  Service: Gynecology;  Laterality: Bilateral;    There were no vitals filed for this visit.      Subjective Assessment - 12/12/15 1024    Subjective Pt reports that Rt hip is feeling slightly better.  15-20% overall improvement.     Currently in Pain? Yes   Pain Score 2    Pain Location Buttocks   Pain Orientation Right   Pain Descriptors / Indicators Aching;Dull   Pain Type Chronic pain   Pain Onset More than a month ago   Pain Frequency Intermittent   Aggravating Factors  standing for too long   Pain Relieving Factors dry needling, massage, stretching.            [facility] PT Assessment - 12/12/15 0001      Observation/Other Assessments   Focus on Therapeutic Outcomes (FOTO)  46% limitation                     [facility] Adult PT Treatment/Exercise - 12/12/15 0001      Exercises   Exercises Lumbar     Lumbar Exercises: Stretches   Single Knee to Chest Stretch 3 reps;20 seconds   Piriformis Stretch 3 reps;20 seconds     Manual Therapy  Manual Therapy Soft tissue mobilization   Soft tissue mobilization Rt glute medius, around the greater trochanter area, piriformis          Trigger Point Dry Needling - Jan 10, 2016 1029    Consent Given? Yes   Muscles Treated Lower Body Gluteus minimus;Gluteus maximus;Piriformis;Tensor fascia lata   Gluteus Maximus Response Twitch response elicited;Palpable increased muscle length   Gluteus Minimus Response Twitch response elicited;Palpable increased muscle length   Piriformis Response Twitch response elicited;Palpable increased muscle length   Tensor Fascia Lata Response Twitch response elicited;Palpable increased muscle length                PT Short Term Goals - 01/10/2016 1026      PT SHORT TERM GOAL #1   Title be independent in initial HEP   Status Achieved     PT SHORT TERM GOAL #3   Title --            PT Long Term Goals - January 10, 2016 1026      PT LONG TERM GOAL #1   Title be independent in advanced HEP   Time 6   Period Weeks   Status On-going     PT LONG TERM GOAL #2   Title reduce FOTO to < 38% to demo improved mobility   Time 6   Period Weeks   Status On-going     PT LONG TERM GOAL #3   Title 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  Pt with reduced tension and improved tissue mobility after needling today.  Pt reports 15-20% overall improvement in symptoms since the start of care.  Pt will continue to benefit from skilled PT for manual, needling, hip strength and flexiblity with modalities PRN.     Rehab Potential Good   PT Frequency 2x / week   PT Duration 6 weeks   PT Treatment/Interventions ADLs/Self Care Home Management;Cryotherapy;Electrical Stimulation;Iontophoresis 9m/ml Dexamethasone;Functional mobility training;Passive range of motion;Patient/family education;Gait training;Stair training;Ultrasound;Therapeutic activities;Moist Heat;Balance training;Neuromuscular re-education;Therapeutic exercise;Manual techniques;Dry needling;Taping   PT Next Visit Plan Continue with soft tissue work to lateral hip, DN #4 when scheduled.  Gentle hip strength.        Patient will benefit from skilled therapeutic intervention in order to improve the following deficits and impairments:  Decreased activity tolerance, Pain, Decreased endurance, Difficulty walking, Decreased strength, Increased edema, Decreased balance  Visit Diagnosis: Muscle weakness (generalized)  Pain in right hip       G-Codes - [id number]    Functional Assessment Tool Used FOTO: 46% limitation   Functional Limitation Mobility: Walking and moving around    Mobility: Walking and Moving Around Current Status [phone] At least 20 percent but less than 40 percent impaired, limited or restricted   Mobility: Walking and Moving Around Goal Status [phone] At least 20 percent but less than 40 percent impaired, limited or restricted      Problem List Patient Active Problem List   Diagnosis Date Noted  . Genetic testing 05/23/2014  . History of breast cancer   . Family history of breast cancer   . Endometrial cancer ([place] 03/21/2014  .  Uterine cancer ([facility]) 03/21/2014  . S/P mastectomy, bilateral 05/02/2013  . Situational stress 05/02/2013  . Osteoporosis, unspecified 05/02/2013  . Breast cancer, left breast ([facility]) 05/02/2013     Kelly Takacs, PT 12/12/15 10:57 AM  [facility] [address], [address] [place], [place], [address] Phone: [phone]   Fax:  [phone]  Name: Akua M Bogart MRN: [id number] Date of Birth: 08/30/1940    

## 2015-12-17 ENCOUNTER — Ambulatory Visit: Payer: PPO

## 2015-12-17 DIAGNOSIS — M6281 Muscle weakness (generalized): Secondary | ICD-10-CM

## 2015-12-17 DIAGNOSIS — M25551 Pain in right hip: Secondary | ICD-10-CM

## 2015-12-17 NOTE — Therapy (Signed)
[facility] [address] [place], [place], [address] Phone: [phone]   Fax:  [phone]  Physical Therapy Treatment  Patient Details  Name: Beth Simon MRN: [id number] Date of Birth: 06/21/1940 Referring Provider: Albertha Ghee  Encounter Date: 12/17/2015      PT End of Session - 12/17/15 1223    Visit Number 11   Number of Visits 20   Date for PT Re-Evaluation 12/24/15   PT Start Time 1146  dry needling   PT Stop Time 1234   PT Time Calculation (min) 48 min   Activity Tolerance Patient tolerated treatment well   Behavior During Therapy [facility] for tasks assessed/performed      Past Medical History:  Diagnosis Date  . Anemia   . Breast cancer ([facility]) 1990   bilateral mastectomy, adenoca breast-left MRM, reconstruction, chemo  . Bronchitis last 2 weeks   saw dr Pete Glatter 02-28-2014, he said no antibiotics needed, nonproductive cough  . Complication of anesthesia   . Cystitis    cytoxen, had once or twice  . Family history of breast cancer   . History of breast cancer   . History of radiation therapy 2/10, 2/12, 2/18, 2/24, 05/29/14   vaginal cuff/ 30 Gy/ 5 fx  . Macular degeneration   . Neck pain    taking physictal therapy last 2 weeks  . Osteoporosis    on Evista  . PONV (postoperative nausea and vomiting)   . Shoulder pain    taking physical therapy for last few weeks  . Uterine cancer ([facility]) 03/21/2014   MLH1/PMS2 LOH  . Uterine fibroid   . Vasovagal syncopes     Past Surgical History:  Procedure Laterality Date  . BREAST IMPLANT REMOVAL  6/94   left breast  . BREAST RECONSTRUCTION  1990   left  . CATARACT EXTRACTION Bilateral   . EXCISION VAGINAL CYST    . MASTECTOMY Right 10/08/06   prophylactic  . RADICAL MASTECTOMY LND  1990   left, chemo done  . ROBOTIC ASSISTED TOTAL HYSTERECTOMY WITH BILATERAL SALPINGO OOPHERECTOMY Bilateral 03/21/2014   Procedure: ROBOTIC ASSISTED TOTAL  HYSTERECTOMY WITH BILATERAL SALPINGO OOPHORECTOMY ;  Surgeon: Laurette Schimke, MD;  Location: [facility];  Service: Gynecology;  Laterality: Bilateral;    There were no vitals filed for this visit.      Subjective Assessment - 12/17/15 1150    Subjective Pt reports ~50% overall improvement in symptoms since the start of care.  Pt reports that she has relief after each session or when getting a massage but not lasting relief.     Pertinent History injection Rt hip 8/4   Currently in Pain? Yes   Pain Location Buttocks   Pain Orientation Right   Pain Descriptors / Indicators Aching;Dull   Pain Type Chronic pain   Pain Onset More than a month ago   Pain Frequency Intermittent   Aggravating Factors  standing for too long, getting out of the car   Pain Relieving Factors dry needling, massage, stretching                         [facility] Adult PT Treatment/Exercise - 12/17/15 0001      Lumbar Exercises: Stretches   Active Hamstring Stretch 3 reps;20 seconds   Single Knee to Chest Stretch 3 reps;20 seconds   Piriformis Stretch 3 reps;20 seconds     Knee/Hip Exercises: Aerobic   Nustep L1 x 10 min  Modalities   Modalities Moist Heat     Moist Heat Therapy   Number Minutes Moist Heat 10 Minutes   Moist Heat Location Hip;Other (comment)  glutals and ITB     Manual Therapy   Manual Therapy Soft tissue mobilization   Soft tissue mobilization Rt glute medius, around the greater trochanter area, and piriformis in Lt sidelying          Trigger Point Dry Needling - 12/17/15 1157    Consent Given? Yes   Muscles Treated Lower Body Gluteus minimus;Piriformis;Tensor fascia lata   Gluteus Minimus Response Twitch response elicited;Palpable increased muscle length   Piriformis Response Twitch response elicited;Palpable increased muscle length   Tensor Fascia Lata Response Twitch response elicited;Palpable increased muscle length                PT Short Term Goals -  12/17/15 1150      PT SHORT TERM GOAL #2   Title Pt will demo Rt LE strength 4/5 to improve ability to perform home and recreation activities with decreased pain   Time 3   Period Weeks   Status On-going           PT Long Term Goals - 12/17/15 1150      PT LONG TERM GOAL #1   Title be independent in advanced HEP   Time 6   Period Weeks   Status On-going     PT LONG TERM GOAL #2   Title reduce FOTO to < 38% to demo improved mobility   Time 6   Period Weeks   Status On-going     PT LONG TERM GOAL #3   Title 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  Pt with continued active trigger points in Rt gluteals and tensor fascia and demonstrates reduced tension and improved tissue mobility after needling today.  Pt will continue to benefit from skilled PT for manual, needling, hip strength and flexiblity and modalities PRN.     Rehab Potential Good   PT Frequency 2x / week   PT Duration 6 weeks   PT Treatment/Interventions ADLs/Self Care Home Management;Cryotherapy;Electrical Stimulation;Iontophoresis '4mg'$ /ml Dexamethasone;Functional mobility training;Passive range of motion;Patient/family education;Gait training;Stair training;Ultrasound;Therapeutic activities;Moist Heat;Balance training;Neuromuscular re-education;Therapeutic exercise;Manual techniques;Dry needling;Taping   PT Next Visit Plan Continue with soft tissue work to lateral hip, DN #5 when scheduled.  Gentle hip strength.  Test Rt LE strength   Consulted and Agree with Plan of Care Patient      Patient will benefit from skilled therapeutic intervention in order to improve the following deficits and impairments:  Decreased activity tolerance, Pain, Decreased endurance, Difficulty walking, Decreased  strength, Increased edema, Decreased balance  Visit Diagnosis: Muscle weakness (generalized)  Pain in right hip     Problem List Patient Active Problem List   Diagnosis Date Noted  . Genetic testing 05/23/2014  . History of breast cancer   . Family history of breast cancer   . Endometrial cancer ([place]) 03/21/2014  . Uterine cancer ([place]) 03/21/2014  . S/P mastectomy, bilateral 05/02/2013  . Situational stress 05/02/2013  . Osteoporosis, unspecified 05/02/2013  . Breast cancer, left breast ([place]) 05/02/2013     Claiborne Billings  Garnetta Buddy, PT 12/17/15 12:26 PM  [facility] [address], [place], [place], [address] Phone: [phone]   Fax:  [phone]  Name: Beth Simon MRN: [id number] Date of Birth: Apr 19, 1940

## 2015-12-19 ENCOUNTER — Ambulatory Visit: Payer: PPO | Admitting: Physical Therapy

## 2015-12-19 ENCOUNTER — Encounter: Payer: Self-pay | Admitting: Physical Therapy

## 2015-12-19 DIAGNOSIS — M6281 Muscle weakness (generalized): Secondary | ICD-10-CM | POA: Diagnosis not present

## 2015-12-19 DIAGNOSIS — M25551 Pain in right hip: Secondary | ICD-10-CM

## 2015-12-19 NOTE — Patient Instructions (Signed)
RT hip release for external rotation:   GENTLE!! Breathe & relax. If you have pain use more pillow under knee for support.     Lay on back with both knees bent. Lower abs in, pelvis is level. Slowly let your right thigh bone rotate outwards then even slower back in. Do a small ROM that does not hurt and you can completely relax. Do 10x   Then, after some rest, repeat the above process allowing your thigh bone to go a little farther. Still keeping your pelvis level and no hip joint pain. Do 10x. Then rest.   Get the pillow: Start with the pillow folded in half, place bottom of RT foot right up against the inner part of your lower LT  Leg. Let your thigh bone rotate outwards again, resting the knee on the pillow. You choose how far you can go. Your criteria is no pain and a position in which you can relax completely. Hold the position for 3-4 slow, deep breaths. Rest one minute, then repeat the same process. Do 3 times in total.

## 2015-12-19 NOTE — Therapy (Signed)
[facility] [address] [place], [place], [address] Phone: [phone]   Fax:  [phone]  Physical Therapy Treatment  Patient Details  Name: Beth Simon MRN: [id number] Date of Birth: 21-Nov-1940 Referring Provider: Wandra Feinstein  Encounter Date: 12/19/2015      PT End of Session - 12/19/15 1153    Visit Number 12   Number of Visits 20   Date for PT Re-Evaluation 12/24/15   PT Start Time 1150   PT Stop Time 1250   PT Time Calculation (min) 60 min   Activity Tolerance Patient tolerated treatment well   Behavior During Therapy [facility] for tasks assessed/performed      Past Medical History:  Diagnosis Date  . Anemia   . Breast cancer ([place]) 1990   bilateral mastectomy, adenoca breast-left MRM, reconstruction, chemo  . Bronchitis last 2 weeks   saw dr Felipa Eth 02-28-2014, he said no antibiotics needed, nonproductive cough  . Complication of anesthesia   . Cystitis    cytoxen, had once or twice  . Family history of breast cancer   . History of breast cancer   . History of radiation therapy 2/10, 2/12, 2/18, 2/24, 05/29/14   vaginal cuff/ 30 Gy/ 5 fx  . Macular degeneration   . Neck pain    taking physictal therapy last 2 weeks  . Osteoporosis    on Evista  . PONV (postoperative nausea and vomiting)   . Shoulder pain    taking physical therapy for last few weeks  . Uterine cancer ([place]) 03/21/2014   MLH1/PMS2 LOH  . Uterine fibroid   . Vasovagal syncopes     Past Surgical History:  Procedure Laterality Date  . BREAST IMPLANT REMOVAL  6/94   left breast  . BREAST RECONSTRUCTION  1990   left  . CATARACT EXTRACTION Bilateral   . EXCISION VAGINAL CYST    . MASTECTOMY Right 10/08/06   prophylactic  . RADICAL MASTECTOMY LND  1990   left, chemo done  . ROBOTIC ASSISTED TOTAL HYSTERECTOMY WITH BILATERAL SALPINGO OOPHERECTOMY Bilateral 03/21/2014   Procedure: ROBOTIC ASSISTED TOTAL HYSTERECTOMY WITH  BILATERAL SALPINGO OOPHORECTOMY ;  Surgeon: Janie Morning, MD;  Location: [facility];  Service: Gynecology;  Laterality: Bilateral;    There were no vitals filed for this visit.      Subjective Assessment - 12/19/15 1151    Subjective I am finally feeling like the treatments are helping me beyond just the rest of the day. My hip hurts most when putting my shoes on.   Currently in Pain? Yes   Pain Score 1    Pain Location Hip   Pain Orientation Right   Pain Descriptors / Indicators Aching   Multiple Pain Sites No                         [facility] Adult PT Treatment/Exercise - 12/19/15 0001      Knee/Hip Exercises: Aerobic   Nustep L2 x 10 min     Knee/Hip Exercises: Standing   Hip Abduction AROM;AAROM;Both;2 sets;10 reps   Hip Extension AROM;Stengthening;Right;1 set;10 reps     Knee/Hip Exercises: Supine   Other Supine Knee/Hip Exercises Supine hip ER release series  One pillow for support under RT knee     Moist Heat Therapy   Number Minutes Moist Heat 15 Minutes   Moist Heat Location Hip;Other (comment)  glutals and ITB     Manual Therapy  Manual Therapy Soft tissue mobilization   Soft tissue mobilization Rt glute medius, around the greater trochanter area, and piriformis in Lt sidelying                PT Education - 12/19/15 1206    Education provided Yes   Education Details Hip release technique for ER for HEP   Person(s) Educated Patient   Methods Explanation;Demonstration;Tactile cues;Verbal cues   Comprehension Verbalized understanding;Returned demonstration          PT Short Term Goals - 12/17/15 1150      PT SHORT TERM GOAL #2   Title Pt will demo Rt LE strength 4/5 to improve ability to perform home and recreation activities with decreased pain   Time 3   Period Weeks   Status On-going           PT Long Term Goals - 12/17/15 1150      PT LONG TERM GOAL #1   Title be independent in advanced HEP   Time 6   Period Weeks    Status On-going     PT LONG TERM GOAL #2   Title reduce FOTO to < 38% to demo improved mobility   Time 6   Period Weeks   Status On-going     PT LONG TERM GOAL #3   Title 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 Inc=strcuted pt in supine hip release seried that his gentle to the hip joint. She had complained the priior stretches for ER were very painful.    Rehab Potential Good   Clinical Impairments Affecting Rehab Potential None   PT Frequency 2x / week   PT Duration 6 weeks   PT Treatment/Interventions ADLs/Self Care Home Management;Cryotherapy;Electrical Stimulation;Iontophoresis '4mg'$ /ml Dexamethasone;Functional mobility training;Passive range of motion;Patient/family education;Gait training;Stair training;Ultrasound;Therapeutic activities;Moist Heat;Balance training;Neuromuscular re-education;Therapeutic exercise;Manual techniques;Dry needling;Taping   PT Next Visit Plan Continue with soft tissue work to lateral hip, DN #5 when scheduled.  Gentle hip strength.  Test Rt LE strength, ERO coming up.    Consulted and Agree with Plan of Care --      Patient will benefit from skilled therapeutic intervention in order to improve the following deficits and impairments:  Decreased activity tolerance, Pain, Decreased endurance, Difficulty walking, Decreased strength, Increased edema, Decreased balance  Visit Diagnosis: Muscle weakness (generalized)  Pain in right hip     Problem List Patient Active Problem List   Diagnosis Date Noted  . Genetic testing 05/23/2014  . History of breast cancer   . Family history of breast cancer   . Endometrial cancer ([place]) 03/21/2014  . Uterine cancer ([place]) 03/21/2014  . S/P mastectomy, bilateral 05/02/2013  . Situational stress 05/02/2013   . Osteoporosis, unspecified 05/02/2013  . Breast cancer, left breast ([place]) 05/02/2013    Jamila Slatten, PTA 12/19/2015, 12:36 PM  [facility] [address], [place], [place], [address] Phone: [phone]   Fax:  [phone]  Name: Beth Simon MRN: [id number] Date of Birth: 1941-03-25

## 2015-12-24 ENCOUNTER — Ambulatory Visit: Payer: PPO

## 2015-12-24 DIAGNOSIS — M6281 Muscle weakness (generalized): Secondary | ICD-10-CM | POA: Diagnosis not present

## 2015-12-24 DIAGNOSIS — M25551 Pain in right hip: Secondary | ICD-10-CM

## 2015-12-24 NOTE — Therapy (Signed)
[facility] [address], [place], [place], [address] Phone: [phone]   Fax:  [phone]  Physical Therapy Treatment  Patient Details  Name: Beth Simon MRN: [id number] Date of Birth: 10/09/1940 Referring Provider: Wandra Feinstein  Encounter Date: 12/24/2015      PT End of Session - 12/24/15 1225    Visit Number 13   PT Start Time 6962   PT Stop Time 9528   PT Time Calculation (min) 50 min   Activity Tolerance Patient tolerated treatment well   Behavior During Therapy [facility] for tasks assessed/performed      Past Medical History:  Diagnosis Date  . Anemia   . Breast cancer ([place]) 1990   bilateral mastectomy, adenoca breast-left MRM, reconstruction, chemo  . Bronchitis last 2 weeks   saw dr Felipa Eth 02-28-2014, he said no antibiotics needed, nonproductive cough  . Complication of anesthesia   . Cystitis    cytoxen, had once or twice  . Family history of breast cancer   . History of breast cancer   . History of radiation therapy 2/10, 2/12, 2/18, 2/24, 05/29/14   vaginal cuff/ 30 Gy/ 5 fx  . Macular degeneration   . Neck pain    taking physictal therapy last 2 weeks  . Osteoporosis    on Evista  . PONV (postoperative nausea and vomiting)   . Shoulder pain    taking physical therapy for last few weeks  . Uterine cancer ([place]) 03/21/2014   MLH1/PMS2 LOH  . Uterine fibroid   . Vasovagal syncopes     Past Surgical History:  Procedure Laterality Date  . BREAST IMPLANT REMOVAL  6/94   left breast  . BREAST RECONSTRUCTION  1990   left  . CATARACT EXTRACTION Bilateral   . EXCISION VAGINAL CYST    . MASTECTOMY Right 10/08/06   prophylactic  . RADICAL MASTECTOMY LND  1990   left, chemo done  . ROBOTIC ASSISTED TOTAL HYSTERECTOMY WITH BILATERAL SALPINGO OOPHERECTOMY Bilateral 03/21/2014   Procedure: ROBOTIC ASSISTED TOTAL HYSTERECTOMY WITH BILATERAL SALPINGO OOPHORECTOMY ;  Surgeon: Janie Morning,  MD;  Location: [facility];  Service: Gynecology;  Laterality: Bilateral;    There were no vitals filed for this visit.      Subjective Assessment - 12/24/15 1153    Subjective Ready for D/C.  Going to be out of town for a month.  Pt reports 85-90% overall improvement in symptoms since the start of care.     Currently in Pain? Yes   Pain Score 1    Pain Location Hip   Pain Orientation Right   Pain Descriptors / Indicators Aching   Pain Type Chronic pain   Pain Onset More than a month ago   Pain Frequency Intermittent   Aggravating Factors  getting out of the car, sitting too long, standing too long   Pain Relieving Factors massage, stretching, dry needling            [facility] PT Assessment - 12/24/15 0001      Assessment   Medical Diagnosis Rt hip bursitis, Rt hip labral fraying     Precautions   Precautions Fall   Precaution Comments cancer     Prior Function   Level of Independence Independent   Vocation Retired     Associate Professor   Overall Cognitive Status Within Functional Limits for tasks assessed     Observation/Other Assessments   Focus on Therapeutic Outcomes (FOTO)  40% limitation  Strength   Overall Strength Within functional limits for tasks performed   Overall Strength Comments Rt knee 4+/5, hip flexion 4+/5, abduction 4/5.                       [facility] PT Treatment/Exercise - January 02, 2016 0001      Lumbar Exercises: Stretches   Active Hamstring Stretch 3 reps;20 seconds   Single Knee to Chest Stretch 3 reps;20 seconds   Piriformis Stretch 3 reps;20 seconds  butterfly and supine figure 4     Knee/Hip Exercises: Aerobic   Nustep L2 x 10 min     Modalities   Modalities Moist Heat     Moist Heat Therapy   Number Minutes Moist Heat 10 Minutes   Moist Heat Location Hip     Manual Therapy   Manual Therapy Soft tissue mobilization   Soft tissue mobilization Rt glute medius, around the greater trochanter area, and piriformis in Lt sidelying           Trigger Point Dry Needling - 01-02-16 1156    Consent Given? Yes   Muscles Treated Lower Body Gluteus minimus;Piriformis;Tensor fascia lata   Gluteus Minimus Response Twitch response elicited;Palpable increased muscle length   Piriformis Response Twitch response elicited;Palpable increased muscle length   Tensor Fascia Lata Response Twitch response elicited;Palpable increased muscle length                PT Short Term Goals - 12/17/15 1150      PT SHORT TERM GOAL #2   Title Pt will demo Rt LE strength 4/5 to improve ability to perform home and recreation activities with decreased pain   Time 3   Period Weeks   Status On-going           PT Long Term Goals - 01-02-2016 1154      PT LONG TERM GOAL #1   Title be independent in advanced HEP   Status Achieved     PT LONG TERM GOAL #2   Title reduce FOTO to < 38% to demo improved mobility   Status Partially Met     PT LONG TERM GOAL #3   Title 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 with pain less than 2/10   Status Achieved     PT LONG TERM GOAL #4   Title pt will demo bilat LE strength 4+/5 to improve endurance with standing and walking activities   Status Partially Met             Patient will benefit from skilled therapeutic intervention in order to improve the following deficits and impairments:     Visit Diagnosis: Muscle weakness (generalized)  Pain in right hip       G-Codes - 01/02/2016 1151    Functional Assessment Tool Used FOTO: 40% limitation   Functional Limitation Mobility: Walking and moving around   Mobility: Walking and Moving Around Goal Status [phone]) At least 20 percent but less than 40 percent impaired, limited or restricted   Mobility: Walking and Moving Around Discharge Status [phone]) At least 20 percent but less than 40 percent impaired, limited or restricted      Problem List Patient Active Problem List   Diagnosis Date Noted  . Genetic testing 05/23/2014  .  History of breast cancer   . Family history of breast cancer   . Endometrial cancer ([place]) 03/21/2014  . Uterine cancer ([place]) 03/21/2014  . S/P mastectomy, bilateral 05/02/2013  .  Situational stress 05/02/2013  . Osteoporosis, unspecified 05/02/2013  . Breast cancer, left breast ([place]) 05/02/2013   PHYSICAL THERAPY DISCHARGE SUMMARY  Visits from Start of Care: 13  Current functional level related to goals / functional outcomes: 85-90% overall improvement.  Pt will D/C to HEP.     Remaining deficits: See above for current status.     Education / Equipment: HEP Plan: Patient agrees to discharge.  Patient goals were partially met. Patient is being discharged due to being pleased with the current functional level.  ?????          Sigurd Sos, PT 12/24/15 12:29 PM  [facility] [address] [place], [place], [address] Phone: [phone]   Fax:  [phone]  Name: Beth Simon MRN: [id number] Date of Birth: December 12, 1940

## 2016-01-17 ENCOUNTER — Ambulatory Visit: Payer: PPO | Admitting: Gynecologic Oncology

## 2016-01-24 DIAGNOSIS — Z23 Encounter for immunization: Secondary | ICD-10-CM | POA: Diagnosis not present

## 2016-01-30 DIAGNOSIS — D369 Benign neoplasm, unspecified site: Secondary | ICD-10-CM

## 2016-01-30 HISTORY — DX: Benign neoplasm, unspecified site: D36.9

## 2016-02-07 DIAGNOSIS — D125 Benign neoplasm of sigmoid colon: Secondary | ICD-10-CM | POA: Diagnosis not present

## 2016-02-07 DIAGNOSIS — K5730 Diverticulosis of large intestine without perforation or abscess without bleeding: Secondary | ICD-10-CM | POA: Diagnosis not present

## 2016-02-07 DIAGNOSIS — D124 Benign neoplasm of descending colon: Secondary | ICD-10-CM | POA: Diagnosis not present

## 2016-02-07 DIAGNOSIS — Z1211 Encounter for screening for malignant neoplasm of colon: Secondary | ICD-10-CM | POA: Diagnosis not present

## 2016-02-07 DIAGNOSIS — D126 Benign neoplasm of colon, unspecified: Secondary | ICD-10-CM | POA: Diagnosis not present

## 2016-02-08 ENCOUNTER — Ambulatory Visit: Payer: PPO | Admitting: Podiatry

## 2016-02-12 DIAGNOSIS — D126 Benign neoplasm of colon, unspecified: Secondary | ICD-10-CM | POA: Diagnosis not present

## 2016-02-12 DIAGNOSIS — Z1211 Encounter for screening for malignant neoplasm of colon: Secondary | ICD-10-CM | POA: Diagnosis not present

## 2016-02-26 DIAGNOSIS — M1611 Unilateral primary osteoarthritis, right hip: Secondary | ICD-10-CM | POA: Diagnosis not present

## 2016-02-26 DIAGNOSIS — R5383 Other fatigue: Secondary | ICD-10-CM | POA: Diagnosis not present

## 2016-02-26 DIAGNOSIS — M810 Age-related osteoporosis without current pathological fracture: Secondary | ICD-10-CM | POA: Diagnosis not present

## 2016-02-26 DIAGNOSIS — E559 Vitamin D deficiency, unspecified: Secondary | ICD-10-CM | POA: Diagnosis not present

## 2016-02-26 NOTE — Progress Notes (Signed)
GYN ONCOLOGY OFFICE VISIT    Beth Simon 75 y.o. female  CC:  Endometrial cancer surveillance  Assessment/Plan:  [name]  is a 75 y.o.  witith Stage IA grade 2 endomerial cancer with 30% myoinvasion and LVSI. Staged 03/21/14. Vaginal brachytherapy completed 05/29/2014 Follow-up in 3 months with Gyn Onc in 3 months Follow-up with Dr. Isidore Simon as scheduled  Follow up with Beth Boroughs NP 05/2016  Pap test at that visit. NED  Fatigue: Encouraged to increase water intake and to begin a regular physical activity  HPI: [name]  is a 75 y.o.   G1 P1 menopausal.  Noted irregular pelvic cramping after the death of her husband.  Vaginal ultrasound 01/10/2014 endometrial stripe 14.38m.  Endometrial mass with cystic changes noted 2x1 cm.  Atrophic ovaries.  Uterine size 6.46x4.21 cm.  EMB 01/10/2014 Complex atypical hyperplasia.  History notable for left breast cancer ER-PR-  treated with radical mastectomy and chemotherapy in 1990.  Subsequently underwent right simple mastectomy 2008 without reconstruction. Denies use of tamoxifen  On 03/21/14  she underwent a robotic hysterectomy, BSO. Frozen section revealed grade 1 cancer. Final pathology revealed a grade 2 lesion with LVSI present and she was staged at stage IA. She was recommended vaginal brachytherapy as adjuvant therapy to reduce risk for local recurrence.  Dr Beth Simon vaginal brachytherapy completed on 05/29/14. Her treatments were without complication. She has been using her vaginal dilator since.   Genetic testing 03/2014 negative FINAL DIAGNOSIS Diagnosis - INVASIVE ENDOMETRIOID CARCINOMA, FIGO GRADE II, LIMITED TO THE INNER HALF OF THE MYOMETRIUM WITH ANGIOLYMPHATIC INVASION. ADJACENT ENDOMETRIAL POLYP. - LEIOMYOMATA. - CERVIX: BENIGN SQUAMOUS MUCOSA AND ENDOCERVICAL MUCOSA. NO DYSPLASIA OR MALIGNANCY. - BILATERAL OVARIES: BENIGN OVARIAN TISSUE WITH ENDOSALPINGIOSIS, NO ATYPIA OR MALIGNANCY. -  BILATERAL FALLOPIAN TUBES: BENIGN FALLOPIAN TUBAL TISSUE, NO ATYPIA OR MALIGNANCY.  Specimen: Uterus, cervix, bilateral ovaries and fallopian tubes. Procedure: Total hysterectomy and bilateral salpingo-oophorectomy. Lymph node sampling performed: No. Specimen integrity: Intact. Maximum tumor size: 3.0 cm. Histologic type: Invasive endometrioid adenocarcinoma. Grade: FIGO Grade II. Myometrial invasion: 0.4 cm where myometrium is 1.2 cm in thickness. (30%) Cervical stromal involvement: No. Extent of involvement of other organs: No. Lymph - vascular invasion: Present. Peritoneal washings: N/A. Lymph nodes: # examined N/A ; # positive N/A.  Subsequent genetic testing on the following 25 genes: APC, ATM, BARD1, BMPR1A, BRCA1, BRCA2, BRIP1, CHD1, CDK4, CDKN2A, CHEK2, EPCAM (large rearrangement only), MLH1, MSH2, MSH6, MUTYH, NBN, PALB2, PMS2, PTEN, RAD51C, RAD51D, SMAD4, STK11, and TP53.   xc  Pap 04/2015 wnl, inflammation noted  Interval history: Beth Simon doing well.  Recent weight loss attributed to dietary changes because of GERD.  Recent colonoscopy with removal of 3 adenomatous hyperplastic polyps  Social Hx:   Social History   Social History  . Marital status: Widowed    Spouse name: N/A  . Number of children: 1  . Years of education: N/A   Occupational History  . Not on file.   Social History Main Topics  . Smoking status: Never Smoker  . Smokeless tobacco: Never Used  . Alcohol use No  . Drug use: No  . Sexual activity: No   Other Topics Concern  . Not on file   Social History Narrative  . No narrative on file  Husband died 506-18-2015    [place]'s .    Past Surgical History:  Procedure Laterality  Date  . BREAST IMPLANT REMOVAL  6/94   left breast  . BREAST RECONSTRUCTION  1990   left  . CATARACT EXTRACTION Bilateral   . EXCISION VAGINAL CYST    . MASTECTOMY Right 10/08/06   prophylactic  . RADICAL  MASTECTOMY LND  1990   left, chemo done  . ROBOTIC ASSISTED TOTAL HYSTERECTOMY WITH BILATERAL SALPINGO OOPHERECTOMY Bilateral 03/21/2014   Procedure: ROBOTIC ASSISTED TOTAL HYSTERECTOMY WITH BILATERAL SALPINGO OOPHORECTOMY ;  Surgeon: Beth Morning, MD;  Location: [facility];  Service: Gynecology;  Laterality: Bilateral;     Past Medical Hx:  Past Medical History:  Diagnosis Date  . Anemia   . Breast cancer ([place]) 1990   bilateral mastectomy, adenoca breast-left MRM, reconstruction, chemo  . Bronchitis last 2 weeks   saw dr Felipa Eth 02-28-2014, he said no antibiotics needed, nonproductive cough  . Complication of anesthesia   . Cystitis    cytoxen, had once or twice  . Family history of breast cancer   . History of breast cancer   . History of radiation therapy 2/10, 2/12, 2/18, 2/24, 05/29/14   vaginal cuff/ 30 Gy/ 5 fx  . Macular degeneration   . Neck pain    taking physictal therapy last 2 weeks  . Osteoporosis    on Evista  . PONV (postoperative nausea and vomiting)   . Shoulder pain    taking physical therapy for last few weeks  . Uterine cancer ([place]) 03/21/2014   MLH1/PMS2 LOH  . Uterine fibroid   . Vasovagal syncopes     Past Gynecological History: G1P1 Menarche 61 . Long history of irregular and heavy mesnses.  Never used birth control. no h/o abnormal pap test Patient's last menstrual period was 03/31/1988 (approximate).  Family Hx:  Family History  Problem Relation Age of Onset  . Breast cancer Sister 43    recurrence age 102  . Breast cancer Paternal Grandmother   . Heart disease Mother   . Heart disease Father   . Hypertension Sister   . Dementia Sister   . Breast cancer Maternal Aunt 68  . Dementia Maternal Aunt     dx in her 58s  Maternal aunt breast cancer dx 76y/o Paternal grandmother breast cancer age at dx ?  REVIEW OF SYSTEMS Constitutional  Feels well Cardiovascular  No chest pain, shortness of breath, or edema,  Pulmonary  No cough or wheeze.   Gastro Intestinal  No nausea, vomitting, or diarrhoea. No bright red blood per rectum, no abdominal pain, change in bowel movement, or constipation.  Genito Urinary  No frequency, urgency, dysuria, vaginal dryness No vaginal bleeding Musculo Skeletal  No myalgia, arthralgia, joint swelling or pain  Neurologic  No weakness, numbness, change in gait,  Psychology  No depression, anxiety, insomnia.    PHYSICAL EXAMINATION Vitals:  HEENT right BP 129/75 (BP Location: Left Arm, Patient Position: Sitting)   Pulse 75   Temp 98.1 F (36.7 C) (Oral)   Resp 18   Ht 4' 11.5" (1.511 m)   Wt 133 lb 4.8 oz (60.5 kg)   LMP 03/31/1988 (Approximate)   SpO2 99%   BMI 26.47 kg/m  Wt Readings from Last 3 Encounters:  02/28/16 133 lb 4.8 oz (60.5 kg)  10/05/15 135 lb 8 oz (61.5 kg)  08/02/15 138 lb 3.2 oz (62.7 kg)    Neck  Supple without any enlargements.  Cardiovascular  Pulse normal rate, regularity and rhythm.  Lungs  Clear to auscultation bilateraly, Skin  No rash/lesions/breakdown  Psychiatry  Alert and oriented to person, place, and time  Abdomen  Normoactive bowel sounds, abdomen soft, non-tender and non obese. Surgical sites well healed. No masses or tenderness at the port sites Genito Urinary  Vulva/vagina: Normal external female genitalia.  No lesions. Markedly atrophic vagina. No discharge or bleeding.   No pelvic tenderness  Bladder/urethra:  No lesions or masses Extremities  No bilateral cyanosis, clubbing or edema. No rash, lesions or petiche.

## 2016-02-28 ENCOUNTER — Ambulatory Visit (INDEPENDENT_AMBULATORY_CARE_PROVIDER_SITE_OTHER): Payer: PPO | Admitting: Podiatry

## 2016-02-28 ENCOUNTER — Encounter: Payer: Self-pay | Admitting: Gynecologic Oncology

## 2016-02-28 ENCOUNTER — Ambulatory Visit: Payer: PPO | Attending: Gynecologic Oncology | Admitting: Gynecologic Oncology

## 2016-02-28 ENCOUNTER — Encounter: Payer: Self-pay | Admitting: Podiatry

## 2016-02-28 VITALS — BP 129/75 | HR 75 | Temp 98.1°F | Resp 18 | Ht 59.5 in | Wt 133.3 lb

## 2016-02-28 DIAGNOSIS — L84 Corns and callosities: Secondary | ICD-10-CM | POA: Diagnosis not present

## 2016-02-28 DIAGNOSIS — B351 Tinea unguium: Secondary | ICD-10-CM | POA: Diagnosis not present

## 2016-02-28 DIAGNOSIS — M79676 Pain in unspecified toe(s): Secondary | ICD-10-CM

## 2016-02-28 DIAGNOSIS — C541 Malignant neoplasm of endometrium: Secondary | ICD-10-CM

## 2016-02-28 DIAGNOSIS — Z90710 Acquired absence of both cervix and uterus: Secondary | ICD-10-CM | POA: Diagnosis not present

## 2016-02-28 DIAGNOSIS — R5383 Other fatigue: Secondary | ICD-10-CM

## 2016-02-28 DIAGNOSIS — Z803 Family history of malignant neoplasm of breast: Secondary | ICD-10-CM | POA: Insufficient documentation

## 2016-02-28 DIAGNOSIS — Z853 Personal history of malignant neoplasm of breast: Secondary | ICD-10-CM | POA: Diagnosis not present

## 2016-02-28 DIAGNOSIS — Z9221 Personal history of antineoplastic chemotherapy: Secondary | ICD-10-CM | POA: Diagnosis not present

## 2016-02-28 DIAGNOSIS — Z923 Personal history of irradiation: Secondary | ICD-10-CM | POA: Diagnosis not present

## 2016-02-28 DIAGNOSIS — Z9889 Other specified postprocedural states: Secondary | ICD-10-CM | POA: Insufficient documentation

## 2016-02-28 DIAGNOSIS — Z5189 Encounter for other specified aftercare: Secondary | ICD-10-CM | POA: Insufficient documentation

## 2016-02-28 NOTE — Progress Notes (Signed)
Patient ID: Beth Simon, female   DOB: 10/14/1940, 75 y.o.   MRN: [id number]  Subjective: 75 y.o. returns the office today for painful, elongated, thickened toenails which she is unable to trim herself. Denies any redness or drainage around the nails. Denies any swelling redness or drainage. She did have to cut her nails since last appointment since she had to delay her follow-up due to travel. Denies any acute changes since last appointment and no new complaints today. Denies any systemic complaints such as fevers, chills, nausea, vomiting.   Objective: AAO 3, NAD DP/PT pulses palpable, CRT less than 3 seconds Nails hypertrophic, dystrophic, elongated, brittle, discolored 10. There is tenderness overlying the nails 1-5 bilaterally. There is no surrounding erythema or drainage along the nail sites. Hyperkeratotic lesions of the distal aspect left second digit. Upon debridement there is no underlying ulceration, drainage or other signs of infection. No open lesions or pre-ulcerative lesions are identified. No other areas of tenderness bilateral lower extremities. No overlying edema, erythema, increased warmth. Hammertoe contractures bilaerally Bilateral lower extremity edema around the ankles. No pain with calf compression, swelling, warmth, erythema. Exam appears to be mostly unchanged  Assessment: Patient presents with symptomatic onychomycosis; calluses   Plan: -Treatment options including alternatives, risks, complications were discussed -Nails sharply debrided 10 without complication/bleeding. -Hyperkeratotic lesion sharply debrided 1 without complication/bleeding. -Discussed daily foot inspection. If there are any changes, to call the office immediately.  -Follow-up in 3 months or sooner if any problems are to arise. In the meantime, encouraged to call the office with any questions, concerns, changes symptoms.  Celesta Gentile, DPM

## 2016-02-29 DIAGNOSIS — M810 Age-related osteoporosis without current pathological fracture: Secondary | ICD-10-CM | POA: Diagnosis not present

## 2016-02-29 DIAGNOSIS — M7551 Bursitis of right shoulder: Secondary | ICD-10-CM | POA: Diagnosis not present

## 2016-03-07 DIAGNOSIS — M7551 Bursitis of right shoulder: Secondary | ICD-10-CM | POA: Diagnosis not present

## 2016-03-31 DIAGNOSIS — N890 Mild vaginal dysplasia: Secondary | ICD-10-CM

## 2016-03-31 HISTORY — DX: Mild vaginal dysplasia: N89.0

## 2016-04-04 DIAGNOSIS — J209 Acute bronchitis, unspecified: Secondary | ICD-10-CM | POA: Diagnosis not present

## 2016-04-22 ENCOUNTER — Ambulatory Visit: Payer: PPO | Admitting: Nurse Practitioner

## 2016-05-01 ENCOUNTER — Encounter: Payer: Self-pay | Admitting: Podiatry

## 2016-05-01 ENCOUNTER — Ambulatory Visit (INDEPENDENT_AMBULATORY_CARE_PROVIDER_SITE_OTHER): Payer: PPO | Admitting: Podiatry

## 2016-05-01 DIAGNOSIS — Q828 Other specified congenital malformations of skin: Secondary | ICD-10-CM | POA: Diagnosis not present

## 2016-05-01 DIAGNOSIS — M79676 Pain in unspecified toe(s): Secondary | ICD-10-CM | POA: Diagnosis not present

## 2016-05-01 DIAGNOSIS — B351 Tinea unguium: Secondary | ICD-10-CM | POA: Diagnosis not present

## 2016-05-02 DIAGNOSIS — M8589 Other specified disorders of bone density and structure, multiple sites: Secondary | ICD-10-CM | POA: Diagnosis not present

## 2016-05-02 NOTE — Progress Notes (Signed)
Patient ID: ROYALLE FLAD, female   DOB: September 14, 1940, 76 y.o.   MRN: [id number]  Subjective: 76 y.o. returns the office today for painful, elongated, thickened toenails which she is unable to trim herself. Denies any redness or drainage around the nails. Denies any swelling redness or drainage. She also has calluses to both of her feet. Denies any acute changes since last appointment and no new complaints today. Denies any systemic complaints such as fevers, chills, nausea, vomiting.   Objective: AAO 3, NAD DP/PT pulses palpable, CRT less than 3 seconds Nails hypertrophic, dystrophic, elongated, brittle, discolored 10. There is tenderness overlying the nails 1-5 bilaterally. There is no surrounding erythema or drainage along the nail sites. Hyperkeratotic lesions of the distal aspect left second digit. Upon debridement there is no underlying ulceration, drainage or other signs of infection. No open lesions or pre-ulcerative lesions are identified. No other areas of tenderness bilateral lower extremities. No overlying edema, erythema, increased warmth. Hammertoe contractures bilaerally which are rigid.  No pain with calf compression, swelling, warmth, erythema. Exam appears to be mostly unchanged  Assessment: Patient presents with symptomatic onychomycosis; calluses   Plan: -Treatment options including alternatives, risks, complications were discussed -Nails sharply debrided 10 without complication/bleeding. -Hyperkeratotic lesion sharply debrided 1 without complication/bleeding. -Discussed daily foot inspection. If there are any changes, to call the office immediately.  -Follow-up in 3 months or sooner if any problems are to arise. In the meantime, encouraged to call the office with any questions, concerns, changes symptoms.  Celesta Gentile, DPM

## 2016-05-12 ENCOUNTER — Telehealth: Payer: Self-pay | Admitting: Nurse Practitioner

## 2016-05-12 NOTE — Telephone Encounter (Signed)
sPCP got a copy of BMD as well and may have already contacted her. Please let pt know that BMD done on 05/02/16 shows a T Score at the spine of -1.50; right hip neck at -1.50; left hip neck at -1.60.  They did add the left radius and it is -2.60.  But in comparison to previous exam from 04/05/14 there is an increase in BMD  at the spine and both hips.  No comparison of the radius. She is doing very well with Prolia injections with next injection due 08/2016. PCP is ordering these injections for her.  She still needs to be very cautious about not falling and keep up with Vit D and calcium.

## 2016-05-13 NOTE — Telephone Encounter (Signed)
I have attempted to contact this patient by phone with the following results: left message to return call to [place] at [phone] on answering machine (home).  No personal information given. [phone] (Home) *Preferred*

## 2016-05-13 NOTE — Telephone Encounter (Signed)
Patient returning call.

## 2016-05-14 NOTE — Telephone Encounter (Signed)
Patient notified of bone density results.  She has not been notified by Dr. Layne Benton.  Results given, but patient is notified any recommendations will come from Dr. Layne Benton.  She is agreeable.  Fall warnings given.  Patient is very pleased Prolia is working.  Routing to provider for final review.  Closing encounter.

## 2016-05-15 ENCOUNTER — Encounter: Payer: Self-pay | Admitting: Nurse Practitioner

## 2016-05-16 ENCOUNTER — Encounter: Payer: Self-pay | Admitting: Nurse Practitioner

## 2016-05-16 ENCOUNTER — Ambulatory Visit (INDEPENDENT_AMBULATORY_CARE_PROVIDER_SITE_OTHER): Payer: PPO | Admitting: Nurse Practitioner

## 2016-05-16 VITALS — BP 120/78 | HR 72 | Ht 58.75 in | Wt 135.0 lb

## 2016-05-16 DIAGNOSIS — Z01411 Encounter for gynecological examination (general) (routine) with abnormal findings: Secondary | ICD-10-CM | POA: Diagnosis not present

## 2016-05-16 DIAGNOSIS — Z1151 Encounter for screening for human papillomavirus (HPV): Secondary | ICD-10-CM | POA: Diagnosis not present

## 2016-05-16 DIAGNOSIS — Z0000 Encounter for general adult medical examination without abnormal findings: Secondary | ICD-10-CM

## 2016-05-16 DIAGNOSIS — C541 Malignant neoplasm of endometrium: Secondary | ICD-10-CM

## 2016-05-16 DIAGNOSIS — R1031 Right lower quadrant pain: Secondary | ICD-10-CM

## 2016-05-16 DIAGNOSIS — Z9013 Acquired absence of bilateral breasts and nipples: Secondary | ICD-10-CM

## 2016-05-16 DIAGNOSIS — M810 Age-related osteoporosis without current pathological fracture: Secondary | ICD-10-CM | POA: Diagnosis not present

## 2016-05-16 DIAGNOSIS — Z124 Encounter for screening for malignant neoplasm of cervix: Secondary | ICD-10-CM | POA: Diagnosis not present

## 2016-05-16 LAB — POCT URINALYSIS DIPSTICK
BILIRUBIN UA: NEGATIVE
Glucose, UA: NEGATIVE
Ketones, UA: NEGATIVE
NITRITE UA: NEGATIVE
PH UA: 6
Protein, UA: NEGATIVE
RBC UA: NEGATIVE
UROBILINOGEN UA: NEGATIVE

## 2016-05-16 NOTE — Patient Instructions (Signed)

## 2016-05-16 NOTE — Progress Notes (Signed)
Patient ID: Beth Simon, female   DOB: 10-29-1940, 76 y.o.   MRN: [id number]  76 y.o. [id number] Widowed  Caucasian Fe here for annual exam.  Feels well but still gets very tired.  She is a night owl so has to be careful about naps in the day.  She has right midline pain that is dull and "burning" with pain scale of 1-2 that started a few days ago.  Does not seem to be better or worse with bowel or bladder habits.  But she is concerned about UTI.  Pain is not made worse with movement and it is intermittent.  Patient's last menstrual period was 03/31/1988 (approximate).          Sexually active: No.  The current method of family planning is abstinence.    Exercising: No.  The patient does not participate in regular exercise at present. Smoker:  no  Health Maintenance: Pap: 04/16/15, Negative  04/29/12, Negative MMG: Bilateral mastectomy Colonoscopy: 2017, polyps, repeat in 3 years per patient BMD:05/02/16 T Score, -1.5 Spine / -1.5 Right Femur Neck / -1.6 Left Femur Neck / -2.6 Left 1/3 Radius TDaP:12/23/07 Shingles: 12/07/08 Pneumonia: 02/28/14 Prevnar-13, 11/06/04 Pneumovax Hep C and HIV: Not indicated due to age Labs: PCP takes care of all labs   reports that she has never smoked. She has never used smokeless tobacco. She reports that she does not drink alcohol or use drugs.  Past Medical History:  Diagnosis Date  . Adenomatous polyps 01/2016  . Anemia   . Breast cancer ([facility]) 1990   bilateral mastectomy, adenoca breast-left MRM, reconstruction, chemo  . Bronchitis last 2 weeks   saw dr Felipa Eth 02-28-2014, he said no antibiotics needed, nonproductive cough  . Complication of anesthesia   . Cystitis    cytoxen, had once or twice  . Family history of breast cancer   . History of breast cancer   . History of radiation therapy 2/10, 2/12, 2/18, 2/24, 05/29/14   vaginal cuff/ 30 Gy/ 5 fx  . Macular degeneration   . Neck pain    taking physictal therapy last 2 weeks  . Osteoporosis    on  Evista  . PONV (postoperative nausea and vomiting)   . Shoulder pain    taking physical therapy for last few weeks  . Uterine cancer ([facility]) 03/21/2014   MLH1/PMS2 LOH  . Uterine fibroid   . Vasovagal syncopes     Past Surgical History:  Procedure Laterality Date  . BREAST IMPLANT REMOVAL  6/94   left breast  . BREAST RECONSTRUCTION  1990   left  . CATARACT EXTRACTION Bilateral   . EXCISION VAGINAL CYST    . MASTECTOMY Right 10/08/06   prophylactic  . RADICAL MASTECTOMY LND  1990   left, chemo done  . ROBOTIC ASSISTED TOTAL HYSTERECTOMY WITH BILATERAL SALPINGO OOPHERECTOMY Bilateral 03/21/2014   Procedure: ROBOTIC ASSISTED TOTAL HYSTERECTOMY WITH BILATERAL SALPINGO OOPHORECTOMY ;  Surgeon: Janie Morning, MD;  Location: [facility];  Service: Gynecology;  Laterality: Bilateral;    Current Outpatient Prescriptions  Medication Sig Dispense Refill  . Coenzyme Q10 (COQ10 PO) Take 1 tablet by mouth every morning.     . Cyanocobalamin (VITAMIN B 12 PO) Take 1 tablet by mouth every morning.     . denosumab (PROLIA) 60 MG/ML SOLN injection Inject 60 mg into the skin every 6 (six) months. Administer in upper arm, thigh, or abdomen    . Flaxseed, Linseed, (FLAX SEEDS PO) Take 1 scoop by mouth  every morning. Ground flaxseed in cereal.    . MELATONIN PO Take 1 tablet by mouth at bedtime as needed (sleep.).     . Omega-3 Fatty Acids (FISH OIL PO) Take 1 capsule by mouth every morning.     . OVER THE COUNTER MEDICATION Take 1 tablet by mouth every morning. Astazanthin. (anti-oxidant)    . raloxifene (EVISTA) 60 MG tablet Take 1 tablet (60 mg total) by mouth daily. 90 tablet 3  . VITAMIN D, CHOLECALCIFEROL, PO Take 4,000 Int'l Units by mouth every morning.      No current facility-administered medications for this visit.     Family History  Problem Relation Age of Onset  . Breast cancer Sister 62    recurrence age 75  . Breast cancer Paternal Grandmother   . Heart disease Mother   . Heart  disease Father   . Hypertension Sister   . Dementia Sister   . Breast cancer Maternal Aunt 68  . Dementia Maternal Aunt     dx in her 80s    ROS:  Pertinent items are noted in HPI.  Otherwise, a comprehensive ROS was negative.  Exam:   BP 120/78 (BP Location: Right Arm, Patient Position: Sitting, Cuff Size: Normal)   Pulse 72   Ht 4' 10.75" (1.492 m)   Wt 135 lb (61.2 kg)   LMP 03/31/1988 (Approximate)   BMI 27.50 kg/m  Height: 4' 10.75" (149.2 cm) Ht Readings from Last 3 Encounters:  05/16/16 4' 10.75" (1.492 m)  02/28/16 4' 11.5" (1.511 m)  10/05/15 4' 11.5" (1.511 m)    General appearance: alert, cooperative and appears stated age Head: Normocephalic, without obvious abnormality, atraumatic Neck: no adenopathy, supple, symmetrical, trachea midline and thyroid normal to inspection and palpation Lungs: clear to auscultation bilaterally Breasts: bilateral mastectomies Heart: regular rate and rhythm Abdomen: soft, non-tender; no masses,  no organomegaly Extremities: extremities normal, atraumatic, no cyanosis or edema Skin: Skin color, texture, turgor normal. No rashes or lesions Lymph nodes: Cervical, supraclavicular, and axillary nodes normal. No abnormal inguinal nodes palpated Neurologic: Grossly normal   Pelvic: External genitalia:  no lesions              Urethra:  normal appearing urethra with no masses, tenderness or lesions              Bartholin's and Skene's: normal                 Vagina: normal appearing vagina with normal color and discharge, no lesions              Cervix: absent              Pap taken: Yes.   Bimanual Exam:  Uterus:  uterus absent              Adnexa: no mass, fullness, tenderness - not able to initiate same pain on exam               Rectovaginal: Confirms not able to initiate same pain               Anus:  normal sphincter tone, no lesions  Chaperone present: yes  A:  Well Woman with normal exam  Postmenopausal S/P  bilateral mastectomy secondary to left breast cancer 06/1988 with implant removed 6/94, Right mastectomy 09/2006 prophylactic Osteoporosis treated by PCP with Prolia             S/P Endometrial cancer, FIGO II 03/21/2014               Vit D deficiency  RLQ pain that is mild and intermittent - R/O UTI      P:   Reviewed health and wellness pertinent to exam  Pap smear was done  Mammogram not indicated  Pt is instructed to follow with PCP if pain continues  Follow with urine C&S  Counseled on breast self exam, adequate intake of calcium and vitamin D, diet and exercise, Kegel's exercises return annually or prn  An After Visit Summary was printed and given to the patient.

## 2016-05-18 LAB — URINE CULTURE: ORGANISM ID, BACTERIA: NO GROWTH

## 2016-05-18 NOTE — Progress Notes (Signed)
Encounter reviewed by Dr. Aundria Rud. Status post total robotic hysterectomy with BSO 2015 for endometrial CA.

## 2016-05-19 ENCOUNTER — Other Ambulatory Visit: Payer: Self-pay | Admitting: Nurse Practitioner

## 2016-05-19 NOTE — Telephone Encounter (Signed)
Patient calling for refill request for Raloxifene sent to [facility] on [address] at [phone].

## 2016-05-19 NOTE — Telephone Encounter (Signed)
Pt has called back and we have discussed Prolia and Evista.  She does not need to be on both medications.  With the Evista she has a greater risk of DVT given her cancer history and age.  We will discontinue Evista at this time.  Pt agrees.

## 2016-05-19 NOTE — Telephone Encounter (Signed)
Left pt a message to call back. 

## 2016-05-19 NOTE — Telephone Encounter (Signed)
Medication refill request: evista Last AEX:  05/16/16 PG Next AEX: 05/19/17 PG Last MMG (if hormonal medication request):  Refill authorized: 01/05/15 #[id number]. Today please advise.

## 2016-05-21 ENCOUNTER — Telehealth: Payer: Self-pay | Admitting: *Deleted

## 2016-05-21 DIAGNOSIS — R87811 Vaginal high risk human papillomavirus (HPV) DNA test positive: Secondary | ICD-10-CM

## 2016-05-21 LAB — IPS PAP TEST WITH HPV

## 2016-05-21 MED ORDER — METRONIDAZOLE 0.75 % VA GEL: 1.0000 | Freq: Every day | VAGINAL | 0 refills | Status: AC

## 2016-05-21 NOTE — Telephone Encounter (Signed)
Call to patient. Advised that pap smear was not completely normal and additional testing with colposcopy was recommended. Brief explanation of procedure provided. Also advised of  BV and need for treatment with Metrogel prior to colpo. Alcohol precautions given while on medication and three days following. Colpo scheduled for 06-02-16 with Dr Quincy Simmonds.   Routing to provider for final review. Patient agreeable to disposition. Will close encounter.   CC: Dr Quincy Simmonds, Juluis Rainier.

## 2016-05-21 NOTE — Telephone Encounter (Signed)
-----   Message from Kem Boroughs, [place] sent at 05/21/2016 11:14 AM EST ----- As discussed with you:  Please call pt and let her know that pap is abnormal and that she will need a colpo bioosy with Dr. Quincy Simmonds.  She also has BV on her pap and will need Metrogel HS X 5.

## 2016-06-02 ENCOUNTER — Encounter: Payer: Self-pay | Admitting: Obstetrics and Gynecology

## 2016-06-02 ENCOUNTER — Ambulatory Visit (INDEPENDENT_AMBULATORY_CARE_PROVIDER_SITE_OTHER): Payer: PPO | Admitting: Obstetrics and Gynecology

## 2016-06-02 VITALS — BP 154/70 | HR 70 | Ht 58.75 in | Wt 135.8 lb

## 2016-06-02 DIAGNOSIS — R87811 Vaginal high risk human papillomavirus (HPV) DNA test positive: Secondary | ICD-10-CM

## 2016-06-02 DIAGNOSIS — N870 Mild cervical dysplasia: Secondary | ICD-10-CM | POA: Diagnosis not present

## 2016-06-02 DIAGNOSIS — Z8542 Personal history of malignant neoplasm of other parts of uterus: Secondary | ICD-10-CM

## 2016-06-02 NOTE — Progress Notes (Signed)
Subjective:     Patient ID: Beth Simon, female   DOB: [birth year], 76 y.o.   MRN: [id number]  HPI  Patient here today for colposcopy. Pap smear 05-16-16 Neg:Pos HR HPV. Personal history of endometrial carcinoma.  Pap history: 04-16-15 negative 04-29-12 negative  History of breast cancer 1990.   Widow. Not sexually active for many years.   Review of Systems  LMP: 1990 Contraception:Postmenopausal     Objective:   Physical Exam  Genitourinary:     Colposcopy of vagina.  Speculum placed  3% acetic acid used.  acetowhite change at the vaginal cuff and left vaginal apex.  Lugol's applied and pattern of decreased uptake confirmed at vaginal cuff and left vaginal apex.  3 biopsies of vaginal apex sent [facility] to [facility] lab.  Monsel's applied.  Minimal EBL.  No complications.     Assessment:     Normal pap and positive HR HPV.  Status post robotic total laparoscopic hysterectomy for endometrial cancer.     Plan:     Discussion of abnormal paps, HPV, colposcopy, and treatment for vaginal dysplasia.  Follow up vaginal biopsy results. Instructions/precautions given.  __15_____ minutes face to face time of which over 50% was spent in counseling.   After visit summary to patient.

## 2016-06-02 NOTE — Patient Instructions (Signed)
Please call for heavy vaginal bleeding, fever, or pain uncontrolled by Advil or Tylenol.

## 2016-06-05 ENCOUNTER — Telehealth: Payer: Self-pay | Admitting: Obstetrics and Gynecology

## 2016-06-05 NOTE — Telephone Encounter (Signed)
Patient calling for biopsy results

## 2016-06-05 NOTE — Telephone Encounter (Signed)
Pathology results from colposcopy on 06/02/2016 to Dr.Silva for review and advise.

## 2016-06-06 ENCOUNTER — Encounter: Payer: Self-pay | Admitting: Obstetrics and Gynecology

## 2016-06-06 DIAGNOSIS — Z23 Encounter for immunization: Secondary | ICD-10-CM | POA: Diagnosis not present

## 2016-06-06 DIAGNOSIS — E7800 Pure hypercholesterolemia, unspecified: Secondary | ICD-10-CM | POA: Diagnosis not present

## 2016-06-06 DIAGNOSIS — Z0000 Encounter for general adult medical examination without abnormal findings: Secondary | ICD-10-CM | POA: Diagnosis not present

## 2016-06-06 DIAGNOSIS — Z1389 Encounter for screening for other disorder: Secondary | ICD-10-CM | POA: Diagnosis not present

## 2016-06-06 DIAGNOSIS — Z79899 Other long term (current) drug therapy: Secondary | ICD-10-CM | POA: Diagnosis not present

## 2016-06-06 DIAGNOSIS — I10 Essential (primary) hypertension: Secondary | ICD-10-CM | POA: Diagnosis not present

## 2016-06-06 DIAGNOSIS — K219 Gastro-esophageal reflux disease without esophagitis: Secondary | ICD-10-CM | POA: Diagnosis not present

## 2016-06-06 NOTE — Telephone Encounter (Signed)
Spoke with Beth Simon at [facility] office. Patient's appointment with Dr.Brewster moved up to 06/17/2016 at 2:45 pm with 2:30 pm arrival. Spoke with patient who is agreeable to appointment date and time.  Routing to provider for final review. Patient agreeable to disposition. Will close encounter.

## 2016-06-06 NOTE — Telephone Encounter (Signed)
Patient returning your call.

## 2016-06-06 NOTE — Telephone Encounter (Signed)
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  This is low grade dysplasia of the vagina.  This is not cancer but the cells are not normal.  This is caused from the human papilloma virus.   I would like for [place] to facilitate an appointment with Dr. Skeet Latch, her GYN oncologist who did her hysterectomy for endometrial cancer.  Patient has an appointment in May 2018 with her, but I would like to move the appointment up to this month if possible.  Thank you.

## 2016-06-06 NOTE — Telephone Encounter (Signed)
Spoke with patient. Advised of results as seen below from [place]. Patient is agreeable and verbalizes understanding. Patient states she is available for appointment to be moved forward any day the week of March 19-23 or March 26-30. Advised RN will contact Dr.Brewster's office to move appointment forward and will return call with appointment date and time. Patient is agreeable.  Left message at with scheduling at [facility] office requesting a return call to help facilitate moving this appointment forward.

## 2016-06-09 NOTE — Telephone Encounter (Signed)
Encounter closed and update sent to [address].   [place]

## 2016-06-12 ENCOUNTER — Ambulatory Visit (INDEPENDENT_AMBULATORY_CARE_PROVIDER_SITE_OTHER): Payer: PPO | Admitting: Podiatry

## 2016-06-12 ENCOUNTER — Encounter: Payer: Self-pay | Admitting: Podiatry

## 2016-06-12 DIAGNOSIS — M79676 Pain in unspecified toe(s): Secondary | ICD-10-CM | POA: Diagnosis not present

## 2016-06-12 DIAGNOSIS — L84 Corns and callosities: Secondary | ICD-10-CM

## 2016-06-12 DIAGNOSIS — B351 Tinea unguium: Secondary | ICD-10-CM

## 2016-06-12 NOTE — Progress Notes (Signed)
Patient ID: Beth Simon, female   DOB: [birth year], 76 y.o.   MRN: [id number]  Subjective: 76 y.o. returns the office today for painful, elongated, thickened toenails which she is unable to trim herself. Denies any redness or drainage around the nails. Denies any swelling redness or drainage. She also has calluses to both of her feet. Denies any acute changes since last appointment and no new complaints today. Denies any systemic complaints such as fevers, chills, nausea, vomiting.   Objective: AAO 3, NAD DP/PT pulses palpable, CRT less than 3 seconds Nails hypertrophic, dystrophic, elongated, brittle, discolored 10. There is tenderness overlying the nails 1-5 bilaterally. There is no surrounding erythema or drainage along the nail sites. Hyperkeratotic lesions of the distal aspect left second digit and dorsal lateral 5th digit. Upon debridement there is no underlying ulceration, drainage or other signs of infection. No open lesions or pre-ulcerative lesions are identified. No other areas of tenderness bilateral lower extremities. No overlying edema, erythema, increased warmth. Hammertoe contractures bilaerally which are rigid.  No pain with calf compression, swelling, warmth, erythema.  Assessment: Patient presents with symptomatic onychomycosis; hyperkeratotic lesion   Plan: -Treatment options including alternatives, risks, complications were discussed -Nails sharply debrided 10 without complication/bleeding. -Hyperkeratotic lesion sharply debrided 3 without complication/bleeding. -Discussed daily foot inspection. If there are any changes, to call the office immediately.  -Follow-up in 3 months or sooner if any problems are to arise. In the meantime, encouraged to call the office with any questions, concerns, changes symptoms.  Celesta Gentile, DPM

## 2016-06-13 DIAGNOSIS — M47816 Spondylosis without myelopathy or radiculopathy, lumbar region: Secondary | ICD-10-CM | POA: Diagnosis not present

## 2016-06-13 DIAGNOSIS — M546 Pain in thoracic spine: Secondary | ICD-10-CM | POA: Diagnosis not present

## 2016-06-16 NOTE — Progress Notes (Signed)
GYN ONCOLOGY OFFICE VISIT    HARBOUR NORDMEYER 76 y.o. female  CC:  Endometrial cancer surveillance, VAIN I  Assessment/Plan:  [name]  is a 76 y.o.  witith Stage IA grade 2 endomerial cancer with 30% myoinvasion and LVSI. Staged 03/21/14. Vaginal brachytherapy completed 05/29/2014.   NED  VAIN I Pap WNL +hrHPV Colposcopy with Bx 06/03/2016 c/w VAIN I Options Observation and repap in 1 year Co2 laser ablation  Konrad Dolores opts for observation.   Follow-up in 12 months with Gyn Onc  Follow-up with Dr. Isidore Moos 09/2016 as scheduled. Follow up with Kem Boroughs NP as scheduled   HPI: [name]  is a 76 y.o.   G1 P1 menopausal.  Noted irregular pelvic cramping after the death of her husband.  Vaginal ultrasound 01/10/2014 endometrial stripe 14.60m.  Endometrial mass with cystic changes noted 2x1 cm.  Atrophic ovaries.  Uterine size 6.46x4.21 cm.  EMB 01/10/2014 Complex atypical hyperplasia.  History notable for left breast cancer ER-PR-  treated with radical mastectomy and chemotherapy in 1990.  Subsequently underwent right simple mastectomy 2008 without reconstruction. Denies use of tamoxifen  On 03/21/14  she underwent a robotic hysterectomy, BSO. Frozen section revealed grade 1 cancer. Final pathology revealed a grade 2 lesion with LVSI present and she was staged at stage IA. She was recommended vaginal brachytherapy as adjuvant therapy to reduce risk for local recurrence.  Dr SLanell Personsdelivered vaginal brachytherapy completed on 05/29/14. Her treatments were without complication. She has been using her vaginal dilator since.   Genetic testing 03/2014 negative FINAL DIAGNOSIS Diagnosis - INVASIVE ENDOMETRIOID CARCINOMA, FIGO GRADE II, LIMITED TO THE INNER HALF OF THE MYOMETRIUM WITH ANGIOLYMPHATIC INVASION. ADJACENT ENDOMETRIAL POLYP. - LEIOMYOMATA. - CERVIX: BENIGN SQUAMOUS MUCOSA AND ENDOCERVICAL MUCOSA. NO DYSPLASIA OR MALIGNANCY. - BILATERAL OVARIES:  BENIGN OVARIAN TISSUE WITH ENDOSALPINGIOSIS, NO ATYPIA OR MALIGNANCY. - BILATERAL FALLOPIAN TUBES: BENIGN FALLOPIAN TUBAL TISSUE, NO ATYPIA OR MALIGNANCY.  Specimen: Uterus, cervix, bilateral ovaries and fallopian tubes. Procedure: Total hysterectomy and bilateral salpingo-oophorectomy. Lymph node sampling performed: No. Specimen integrity: Intact. Maximum tumor size: 3.0 cm. Histologic type: Invasive endometrioid adenocarcinoma. Grade: FIGO Grade II. Myometrial invasion: 0.4 cm where myometrium is 1.2 cm in thickness. (30%) Cervical stromal involvement: No. Extent of involvement of other organs: No. Lymph - vascular invasion: Present. Peritoneal washings: N/A. Lymph nodes: # examined N/A ; # positive N/A.  Subsequent genetic testing on the following 25 genes: APC, ATM, BARD1, BMPR1A, BRCA1, BRCA2, BRIP1, CHD1, CDK4, CDKN2A, CHEK2, EPCAM (large rearrangement only), MLH1, MSH2, MSH6, MUTYH, NBN, PALB2, PMS2, PTEN, RAD51C, RAD51D, SMAD4, STK11, and TP53.   xc  Interval history: Beth CONLEEis doing well.  Recent weight loss attributed to dietary changes because of GERD.  Recent colonoscopy with removal of 3 adenomatous hyperplastic polyps  Pap 05/2016 wnl Pap  05/2017 wnl +hrHPV Colposcopy and bx 06/03/2016 VAIN I.  Beth CELIAis sore from the colposcopy.    Social Hx:   Social History   Social History  . Marital status: Widowed    Spouse name: N/A  . Number of children: 1  . Years of education: N/A   Occupational History  . Not on file.   Social History Main Topics  . Smoking status: Never Smoker  . Smokeless tobacco: Never Used  . Alcohol use No  . Drug use: No  . Sexual activity: No   Other Topics Concern  . Not on file   Social  History Narrative  . No narrative on file  Husband died 08-26-2013.    She traveled to [place] to see a sister who had Alzheimer's .    Past Surgical History:  Procedure Laterality Date  . BREAST IMPLANT REMOVAL  6/94   left breast   . BREAST RECONSTRUCTION  1990   left  . CATARACT EXTRACTION Bilateral   . EXCISION VAGINAL CYST    . MASTECTOMY Right 10/08/06   prophylactic  . RADICAL MASTECTOMY LND  1990   left, chemo done  . ROBOTIC ASSISTED TOTAL HYSTERECTOMY WITH BILATERAL SALPINGO OOPHERECTOMY Bilateral 03/21/2014   Procedure: ROBOTIC ASSISTED TOTAL HYSTERECTOMY WITH BILATERAL SALPINGO OOPHORECTOMY ;  Surgeon: Laurette Schimke, MD;  Location: [facility];  Service: Gynecology;  Laterality: Bilateral;     Past Medical Hx:  Past Medical History:  Diagnosis Date  . Adenomatous polyps 01/2016  . Anemia   . Breast cancer ([facility]) 1990   bilateral mastectomy, adenoca breast-left MRM, reconstruction, chemo  . Bronchitis last 2 weeks   saw dr Pete Glatter 02-28-2014, he said no antibiotics needed, nonproductive cough  . Complication of anesthesia   . Cystitis    cytoxen, had once or twice  . Family history of breast cancer   . History of breast cancer   . History of radiation therapy 2/10, 2/12, 2/18, 2/24, 05/29/14   vaginal cuff/ 30 Gy/ 5 fx  . Macular degeneration   . Neck pain    taking physictal therapy last 2 weeks  . Osteoporosis    on Evista  . PONV (postoperative nausea and vomiting)   . Shoulder pain    taking physical therapy for last few weeks  . Uterine cancer ([facility]) 03/21/2014   MLH1/PMS2 LOH  . Uterine fibroid   . VAIN I (vaginal intraepithelial neoplasia grade I) 08/26/2016   positive HR HPV.  . Vasovagal syncopes     Past Gynecological History: G1P1 Menarche 20 . Long history of irregular and heavy mesnses.  Never used birth control. no h/o abnormal pap test Patient's last menstrual period was 03/31/1988 (approximate). 2 sexual partners.  Her second husband was married three times.    Family Hx:  Family History  Problem Relation Age of Onset  . Breast cancer Sister 66    recurrence age 11  . Breast cancer Paternal Grandmother   . Heart disease Mother   . Heart disease Father   . Hypertension Sister    . Dementia Sister   . Breast cancer Maternal Aunt 46  . Dementia Maternal Aunt     dx in her 17s  Maternal aunt breast cancer dx 68y/o Paternal grandmother breast cancer age at dx ?  REVIEW OF SYSTEMS Constitutional  Feels well Cardiovascular  No chest pain, shortness of breath, or edema,  Pulmonary  No cough or wheeze.  Gastro Intestinal  No nausea, vomitting, or diarrhoea. No bright red blood per rectum, no abdominal pain, change in bowel movement, or constipation.  Genito Urinary  No frequency, urgency, dysuria, vaginal soreness No vaginal bleeding Musculo Skeletal  No myalgia, arthralgia, joint swelling or pain  Neurologic  No weakness, numbness, change in gait,  Psychology  No depression, anxiety, insomnia.    PHYSICAL EXAMINATION Vitals:  BP (!) 153/81 (BP Location: Right Arm, Patient Position: Sitting)   Pulse 75   Temp 97.7 F (36.5 C) (Oral)   Resp 18   Wt 134 lb 12.8 oz (61.1 kg)   LMP 03/31/1988 (Approximate)   BMI 27.46 kg/m   Wt  Readings from Last 3 Encounters:  06/02/16 135 lb 12.8 oz (61.6 kg)  05/16/16 135 lb (61.2 kg)  02/28/16 133 lb 4.8 oz (60.5 kg)   Neck  Supple without any enlargements.  Cardiovascular  Pulse normal rate, regularity and rhythm.  Lungs  Clear to auscultation bilateraly, Skin  No rash/lesions/breakdown  Psychiatry  Alert and oriented to person, place, and time  Abdomen  Normoactive bowel sounds, abdomen soft, non-tender and non obese. Surgical sites well healed. No masses or tenderness at the port sites Genito Urinary  Vulva/vagina: Normal external female genitalia.  No lesions. Markedly atrophic vagina. bx site visible No discharge or bleeding.   No pelvic tenderness  Bladder/urethra:  No lesions or masses Extremities  No bilateral cyanosis, clubbing or edema. No rash, lesions or petiche.

## 2016-06-17 ENCOUNTER — Encounter: Payer: Self-pay | Admitting: Gynecologic Oncology

## 2016-06-17 ENCOUNTER — Ambulatory Visit: Payer: PPO | Attending: Gynecologic Oncology | Admitting: Gynecologic Oncology

## 2016-06-17 VITALS — BP 153/81 | HR 75 | Temp 97.7°F | Resp 18 | Wt 134.8 lb

## 2016-06-17 DIAGNOSIS — Z853 Personal history of malignant neoplasm of breast: Secondary | ICD-10-CM | POA: Insufficient documentation

## 2016-06-17 DIAGNOSIS — Z9013 Acquired absence of bilateral breasts and nipples: Secondary | ICD-10-CM | POA: Insufficient documentation

## 2016-06-17 DIAGNOSIS — Z8249 Family history of ischemic heart disease and other diseases of the circulatory system: Secondary | ICD-10-CM | POA: Diagnosis not present

## 2016-06-17 DIAGNOSIS — Z9889 Other specified postprocedural states: Secondary | ICD-10-CM | POA: Insufficient documentation

## 2016-06-17 DIAGNOSIS — C541 Malignant neoplasm of endometrium: Secondary | ICD-10-CM | POA: Insufficient documentation

## 2016-06-17 DIAGNOSIS — N890 Mild vaginal dysplasia: Secondary | ICD-10-CM | POA: Insufficient documentation

## 2016-06-17 DIAGNOSIS — Z923 Personal history of irradiation: Secondary | ICD-10-CM | POA: Insufficient documentation

## 2016-06-17 DIAGNOSIS — Z9221 Personal history of antineoplastic chemotherapy: Secondary | ICD-10-CM | POA: Insufficient documentation

## 2016-06-17 DIAGNOSIS — Z803 Family history of malignant neoplasm of breast: Secondary | ICD-10-CM | POA: Insufficient documentation

## 2016-06-17 DIAGNOSIS — Z8542 Personal history of malignant neoplasm of other parts of uterus: Secondary | ICD-10-CM

## 2016-06-17 DIAGNOSIS — Z90710 Acquired absence of both cervix and uterus: Secondary | ICD-10-CM | POA: Insufficient documentation

## 2016-06-17 NOTE — Patient Instructions (Signed)
Please use the tissue expander / dilator as recommend by Dr Skeet Latch. Follow up with Dr Skeet Latch in 1 year, please call the office in October to schedule an appointment for March 2019.

## 2016-07-16 DIAGNOSIS — M7501 Adhesive capsulitis of right shoulder: Secondary | ICD-10-CM | POA: Diagnosis not present

## 2016-07-16 DIAGNOSIS — M25511 Pain in right shoulder: Secondary | ICD-10-CM | POA: Diagnosis not present

## 2016-07-21 ENCOUNTER — Other Ambulatory Visit: Payer: Self-pay | Admitting: Geriatric Medicine

## 2016-07-21 DIAGNOSIS — G458 Other transient cerebral ischemic attacks and related syndromes: Secondary | ICD-10-CM

## 2016-07-21 DIAGNOSIS — I10 Essential (primary) hypertension: Secondary | ICD-10-CM | POA: Diagnosis not present

## 2016-07-24 ENCOUNTER — Ambulatory Visit: Payer: PPO | Admitting: Podiatry

## 2016-07-25 ENCOUNTER — Other Ambulatory Visit: Payer: PPO

## 2016-07-29 ENCOUNTER — Ambulatory Visit
Admission: RE | Admit: 2016-07-29 | Discharge: 2016-07-29 | Disposition: A | Discharge status: Home / Self Care | Payer: PPO | Source: Ambulatory Visit | Attending: Geriatric Medicine | Admitting: Geriatric Medicine

## 2016-07-29 DIAGNOSIS — I6523 Occlusion and stenosis of bilateral carotid arteries: Secondary | ICD-10-CM | POA: Diagnosis not present

## 2016-07-29 DIAGNOSIS — G458 Other transient cerebral ischemic attacks and related syndromes: Secondary | ICD-10-CM

## 2016-07-31 ENCOUNTER — Ambulatory Visit (INDEPENDENT_AMBULATORY_CARE_PROVIDER_SITE_OTHER): Payer: PPO | Admitting: Podiatry

## 2016-07-31 DIAGNOSIS — R52 Pain, unspecified: Secondary | ICD-10-CM | POA: Diagnosis not present

## 2016-07-31 DIAGNOSIS — M79676 Pain in unspecified toe(s): Secondary | ICD-10-CM

## 2016-07-31 DIAGNOSIS — B351 Tinea unguium: Secondary | ICD-10-CM | POA: Diagnosis not present

## 2016-07-31 DIAGNOSIS — Q828 Other specified congenital malformations of skin: Secondary | ICD-10-CM | POA: Diagnosis not present

## 2016-07-31 IMAGING — CT CT CHEST W/ CM
2 of 3 series · 14 of 31 positions shown, 16 images · IV contrast (75CC ISOVUE 300)
Comparison: Chest x-ray 05/11/2015 and PET scan 05/09/2014

CLINICAL DATA: Abnormal chest x-ray, new atelectasis or scarring in
left lower lung

EXAM:
CT CHEST WITH CONTRAST
TECHNIQUE: Multidetector CT imaging of the chest was performed during
intravenous contrast administration.
CONTRAST:  75mL QG5Q66-SLL IOPAMIDOL ([id number]) INJECTION 61%

[Series 3: chest with · axial · 0.70mm/px · z∈[-192,+33]mm · 6 of 63 slices shown, 8 images]
[im 9/63  mediastinal]
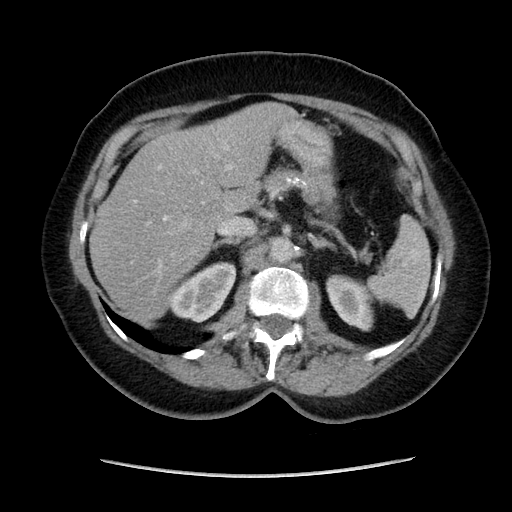
[im 9/63  lung]
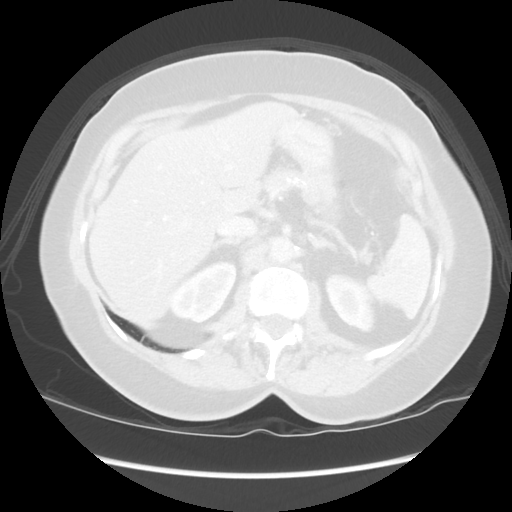
[im 18/63  lung]
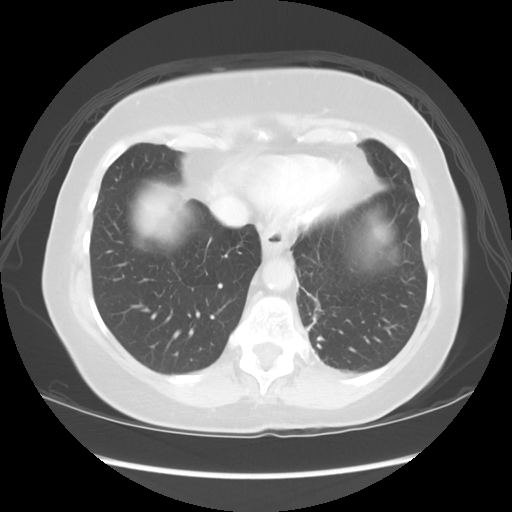
[im 27/63  lung]
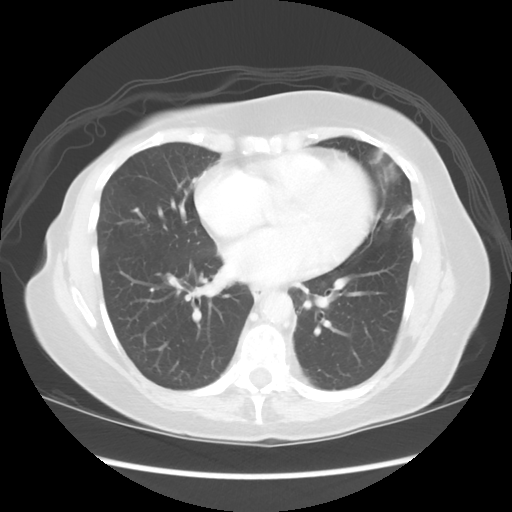
[im 36/63  lung]
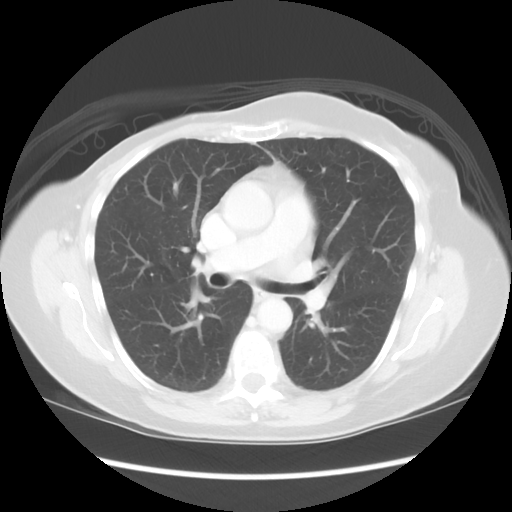
[im 45/63  mediastinal]
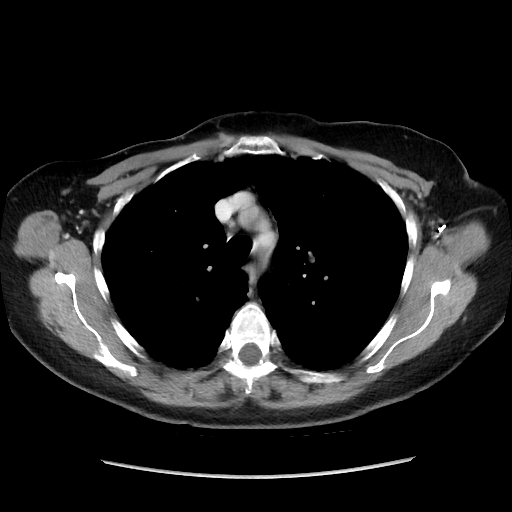
[im 45/63  lung]
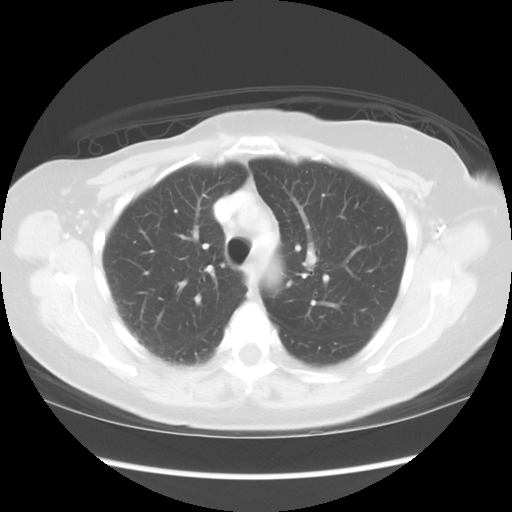
[im 54/63  lung]
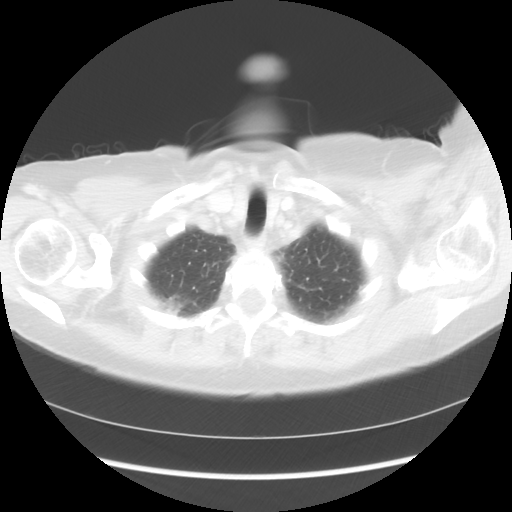

[Series 602: sagittal body · sagittal · 0.70mm/px · 8 of 145 slices shown]
[im 16/145  mediastinal]
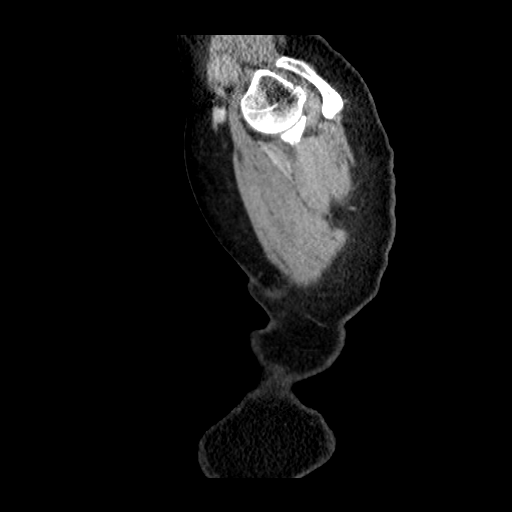
[im 31/145  mediastinal]
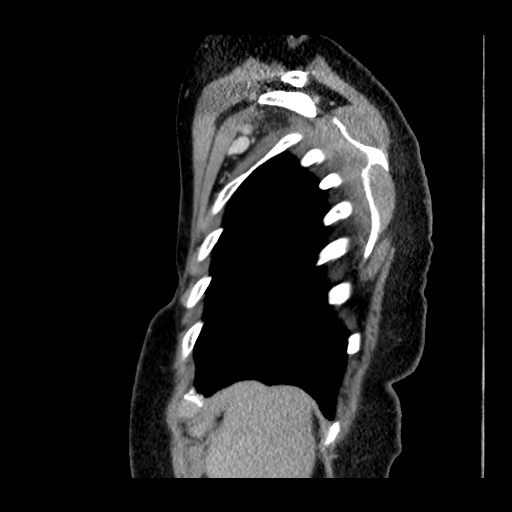
[im 46/145  mediastinal]
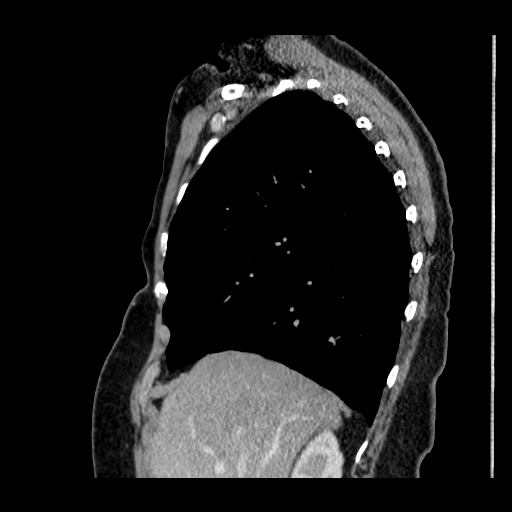
[im 69/145  mediastinal]
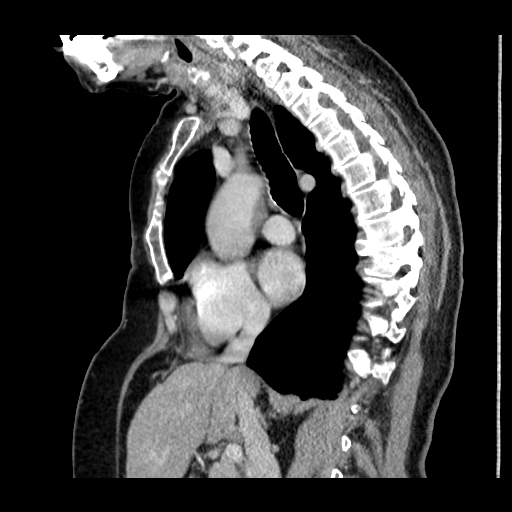
[im 76/145  mediastinal]
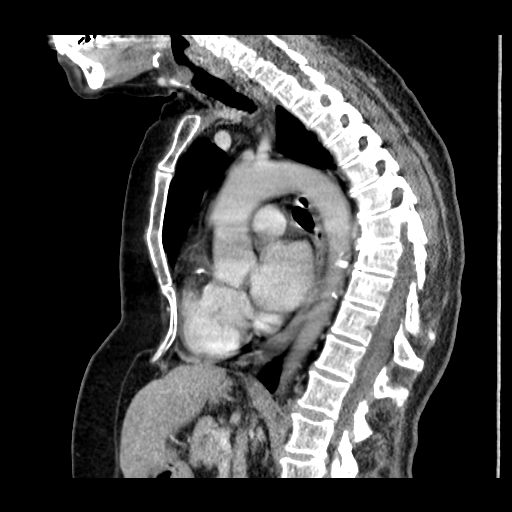
[im 99/145  mediastinal]
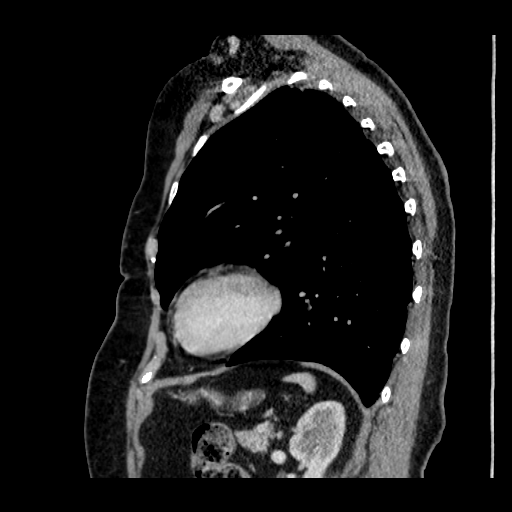
[im 114/145  mediastinal]
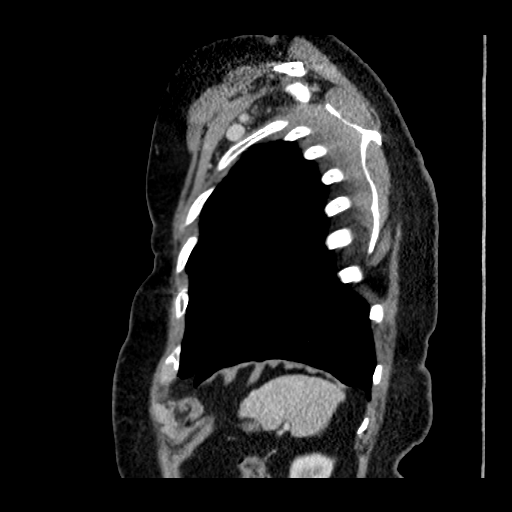
[im 129/145  mediastinal]
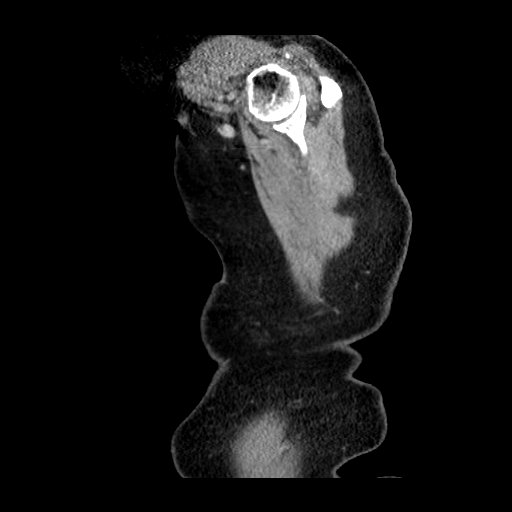

[14 of 31 positions shown; findings below may reference images not displayed]

FINDINGS: Mediastinum/Lymph Nodes: Images of the thoracic inlet are
unremarkable. Central airways are patent. No mediastinal hematoma or
adenopathy. Heart size within normal limits. No pericardial
effusion. Atherosclerotic calcifications of coronary arteries. Small
hiatal hernia.

Lungs/Pleura: No pleural thickening is noted.

Images of the lung parenchyma shows no acute infiltrate or pulmonary
edema. No pulmonary nodules are noted. As noted on recent chest
x-ray there is some linear scarring in lingula and left midlung.
This is best seen in coronal image 48. There is no significant
change from CT images from PET scan. No focal consolidation. There
is no pneumothorax. No bronchiectasis.

Heart size within normal limits.

Upper abdomen: Images of the upper abdomen shows atherosclerotic
calcifications of abdominal aorta. No adrenal gland mass is noted in
visualized upper abdomen. The visualized body and tail of the
pancreas is unremarkable.

Musculoskeletal: Sagittal images of the spine shows mild
degenerative changes mid thoracic spine. Sagittal view of the
sternum is unremarkable. Surgical clips are noted in left axilla.
The patient is status post bilateral mastectomy.
IMPRESSION: 1. No acute infiltrate or pulmonary edema. No mediastinal
adenopathy.
2. There is some linear scarring in left midlung and lingula. There
is no significant change from most recent CT PET images.
3. Status post bilateral mastectomy. Surgical clips are noted in
left axilla.
4. No pulmonary nodules are noted. No bronchiectasis. No focal
consolidation.

## 2016-08-01 NOTE — Progress Notes (Signed)
Patient ID: Beth Simon, female   DOB: 1940/11/07, 76 y.o.   MRN: [id number]  Subjective: 76 y.o. returns the office today for painful, elongated, thickened toenails which she is unable to trim herself. Denies any redness or drainage around the nails. Denies any swelling redness or drainage. She also has calluses to both of her feet. Denies any acute changes since last appointment and no new complaints today. Denies any systemic complaints such as fevers, chills, nausea, vomiting.   Objective: AAO 3, NAD DP/PT pulses palpable, CRT less than 3 seconds Nails hypertrophic, dystrophic, elongated, brittle, discolored 10. There is tenderness overlying the nails 1-5 bilaterally. There is no surrounding erythema or drainage along the nail sites. Hyperkeratotic lesions of the distal aspect left second digit and dorsal lateral 5th digit. Upon debridement there is no underlying ulceration, drainage or other signs of infection. No open lesions or pre-ulcerative lesions are identified. No other areas of tenderness bilateral lower extremities. No overlying edema, erythema, increased warmth. Hammertoe contractures bilaerally which are rigid.  No pain with calf compression, swelling, warmth, erythema.  Assessment: Patient presents with symptomatic onychomycosis; hyperkeratotic lesion   Plan: -Treatment options including alternatives, risks, complications were discussed -Nails sharply debrided 10 without complication/bleeding. -Hyperkeratotic lesion sharply debrided 3 without complication/bleeding. -Discussed daily foot inspection. If there are any changes, to call the office immediately.  -Follow-up in 3 months or sooner if any problems are to arise. In the meantime, encouraged to call the office with any questions, concerns, changes symptoms.  Celesta Gentile, DPM

## 2016-08-04 ENCOUNTER — Other Ambulatory Visit: Payer: PPO

## 2016-08-07 ENCOUNTER — Ambulatory Visit: Payer: PPO | Admitting: Gynecologic Oncology

## 2016-08-13 DIAGNOSIS — H04123 Dry eye syndrome of bilateral lacrimal glands: Secondary | ICD-10-CM | POA: Diagnosis not present

## 2016-08-13 DIAGNOSIS — H524 Presbyopia: Secondary | ICD-10-CM | POA: Diagnosis not present

## 2016-08-13 DIAGNOSIS — Z961 Presence of intraocular lens: Secondary | ICD-10-CM | POA: Diagnosis not present

## 2016-08-22 DIAGNOSIS — I10 Essential (primary) hypertension: Secondary | ICD-10-CM | POA: Diagnosis not present

## 2016-08-22 DIAGNOSIS — R202 Paresthesia of skin: Secondary | ICD-10-CM | POA: Diagnosis not present

## 2016-08-28 DIAGNOSIS — M25511 Pain in right shoulder: Secondary | ICD-10-CM | POA: Diagnosis not present

## 2016-08-28 DIAGNOSIS — R5383 Other fatigue: Secondary | ICD-10-CM | POA: Diagnosis not present

## 2016-08-28 DIAGNOSIS — E559 Vitamin D deficiency, unspecified: Secondary | ICD-10-CM | POA: Diagnosis not present

## 2016-08-28 DIAGNOSIS — M810 Age-related osteoporosis without current pathological fracture: Secondary | ICD-10-CM | POA: Diagnosis not present

## 2016-09-03 DIAGNOSIS — M810 Age-related osteoporosis without current pathological fracture: Secondary | ICD-10-CM | POA: Diagnosis not present

## 2016-09-11 ENCOUNTER — Ambulatory Visit (INDEPENDENT_AMBULATORY_CARE_PROVIDER_SITE_OTHER): Payer: PPO

## 2016-09-11 ENCOUNTER — Encounter: Payer: Self-pay | Admitting: Podiatry

## 2016-09-11 ENCOUNTER — Ambulatory Visit (INDEPENDENT_AMBULATORY_CARE_PROVIDER_SITE_OTHER): Payer: PPO | Admitting: Podiatry

## 2016-09-11 DIAGNOSIS — B351 Tinea unguium: Secondary | ICD-10-CM | POA: Diagnosis not present

## 2016-09-11 DIAGNOSIS — M79676 Pain in unspecified toe(s): Secondary | ICD-10-CM | POA: Diagnosis not present

## 2016-09-11 DIAGNOSIS — S92324A Nondisplaced fracture of second metatarsal bone, right foot, initial encounter for closed fracture: Secondary | ICD-10-CM

## 2016-09-11 DIAGNOSIS — R52 Pain, unspecified: Secondary | ICD-10-CM

## 2016-09-11 DIAGNOSIS — Q828 Other specified congenital malformations of skin: Secondary | ICD-10-CM | POA: Diagnosis not present

## 2016-09-14 NOTE — Progress Notes (Signed)
Patient ID: Beth Simon, female   DOB: Aug 17, 1940, 76 y.o.   MRN: [id number]  Subjective: 76 y.o. returns the office today for painful, elongated, thickened toenails which she is unable to trim herself. Denies any redness or drainage around the nails. Denies any swelling redness or drainage. She also has calluses to both of her feet. Today she also states that her right foot is been hurting the top of foot and she points along the metatarsal area. She did have an injury which he dropped a box on her foot. This was about a month ago and the pain does continue. She has noticed some mild swelling but no redness or warmth. Pain is slowly getting better but does continue. Denies any acute changes since last appointment and no new complaints today. Denies any systemic complaints such as fevers, chills, nausea, vomiting.   Objective: AAO 3, NAD DP/PT pulses palpable, CRT less than 3 seconds Nails hypertrophic, dystrophic, elongated, brittle, discolored 10. There is tenderness overlying the nails 1-5 bilaterally. There is no surrounding erythema or drainage along the nail sites. Hyperkeratotic lesions of the distal aspect left second digit and dorsal lateral 5th digit. Upon debridement there is no underlying ulceration, drainage or other signs of infection. No open lesions or pre-ulcerative lesions are identified. There is tenderness along the second and third metatarsal diaphysis of the right foot. There is trace edema to the area. There is no other area pinpoint bony tenderness or pain vibratory sensation. Is no pain with ankle, subtalar joint were to the ankle. Significant pain to the toes. No other areas of tenderness bilateral lower extremities. No overlying edema, erythema, increased warmth. Hammertoe contractures bilaerally which are rigid.  No pain with calf compression, swelling, warmth, erythema.  Assessment: Patient presents with symptomatic onychomycosis; hyperkeratotic lesion; right foot  contusion  Plan: -Treatment options including alternatives, risks, complications were discussed -Nails sharply debrided 10 without complication/bleeding. -Hyperkeratotic lesion sharply debrided 3 without complication/bleeding. -X-rays were obtained and reviewed which not reveal any evidence of acute fracture. Discussed with the symptoms as well as likely nature the pain. Her pain has improved somewhat and continue with supportive shoe at all times. Not resolve the next 2 weeks to call for follow-up. -Discussed daily foot inspection. If there are any changes, to call the office immediately.  -Follow-up in 3 months or sooner if any problems are to arise. In the meantime, encouraged to call the office with any questions, concerns, changes symptoms.  Celesta Gentile, DPM

## 2016-10-07 DIAGNOSIS — K219 Gastro-esophageal reflux disease without esophagitis: Secondary | ICD-10-CM | POA: Diagnosis not present

## 2016-10-07 DIAGNOSIS — I10 Essential (primary) hypertension: Secondary | ICD-10-CM | POA: Diagnosis not present

## 2016-10-10 ENCOUNTER — Encounter: Payer: Self-pay | Admitting: Radiation Oncology

## 2016-10-10 ENCOUNTER — Ambulatory Visit
Admission: RE | Admit: 2016-10-10 | Discharge: 2016-10-10 | Disposition: A | Discharge status: Home / Self Care | Payer: PPO | Source: Ambulatory Visit | Attending: Radiation Oncology | Admitting: Radiation Oncology

## 2016-10-10 VITALS — BP 152/72 | HR 72 | Temp 98.2°F | Ht 58.75 in | Wt 139.6 lb

## 2016-10-10 DIAGNOSIS — Z08 Encounter for follow-up examination after completed treatment for malignant neoplasm: Secondary | ICD-10-CM | POA: Insufficient documentation

## 2016-10-10 DIAGNOSIS — C541 Malignant neoplasm of endometrium: Secondary | ICD-10-CM

## 2016-10-10 DIAGNOSIS — Z8542 Personal history of malignant neoplasm of other parts of uterus: Secondary | ICD-10-CM | POA: Diagnosis not present

## 2016-10-10 NOTE — Progress Notes (Signed)
Radiation Oncology         [phone] ________________________________  Name: Beth Simon MRN: [id number]  Date: 10/10/2016  DOB: 1940/11/14  Follow-Up Visit Note  Outpatient  CC: Lajean Manes, MD  Nancy Marus, MD  Diagnosis and Prior Radiotherapy:    ICD-10-CM   1. Endometrial cancer ([place]) C54.1    FIGO Stage IA;  pT1a, pNX  Radiation treatment dates:   05/10/2014, 05/12/2014, 05/18/2014, 05/24/2014, 05/29/2014 Site/dose:   Vaginal Cuff / 30 Gy in 5 fractions  Narrative: Beth Simon is here for follow-up of brachytherapy/ HDR for endometrial cancer from 05/10/14 - 05/29/14.   On review of systems, the patient denies pain. She reports occasional incontinence, and reports this occurs once monthly without any preliminary symptoms. She denies vaginal discharge or urinary concerns. The patient reports she is using her vaginal dilator once every 2-3 weeks. The patient reports continued grief related to her husband's death, and has utilized hospice grief counseling services.  The patient's most recent PAP was on 05/16/16 showing HPV +. She underwent vaginal biopsy on 06/02/16 with Dr. Judeth Horn, which showed mild dysplasia. The patient was referred back to Dr. Skeet Latch who saw her on 06/17/16. She was advised to continue using her dilator and to follow up again in March 2019.   ALLERGIES:  is allergic to novocain [procaine].  Meds: Current Outpatient Prescriptions  Medication Sig Dispense Refill  . Coenzyme Q10 (COQ10 PO) Take 1 tablet by mouth every morning.     . Cyanocobalamin (VITAMIN B 12 PO) Take 1 tablet by mouth every morning.     . denosumab (PROLIA) 60 MG/ML SOLN injection Inject 60 mg into the skin every 6 (six) months. Administer in upper arm, thigh, or abdomen    . Flaxseed, Linseed, (FLAX SEEDS PO) Take 1 scoop by mouth every morning. Ground flaxseed in cereal.    . MELATONIN PO Take 1 tablet by mouth at bedtime as needed (sleep.).     Marland Kitchen Omega-3 Fatty Acids (FISH OIL PO) Take  1 capsule by mouth every morning.     Marland Kitchen OVER THE COUNTER MEDICATION Take 1 tablet by mouth every morning. Astazanthin. (anti-oxidant)    . VITAMIN D, CHOLECALCIFEROL, PO Take 4,000 Int'l Units by mouth every morning.      No current facility-administered medications for this encounter.     Physical Findings: The patient is in no acute distress. Patient is alert and oriented.  height is 4' 10.75" (1.492 m) and weight is 139 lb 9.6 oz (63.3 kg). Her temperature is 98.2 F (36.8 C). Her blood pressure is 152/72 (abnormal) and her pulse is 72. Her oxygen saturation is 95%.  Neck: Without any masses. Heart: Regular in rate and rhythm, no murmurs.  Chest: Clear to ascultation bilaterally.  Abdomen: Soft, non tender, non distended. Extremities: She has swelling in her ankles, slightly more in the left. No calf tenderness. Lymph: Groin without palpable masses.  Psych: Affect appropriate, judgment intact.  GYN: External genitalia are normal appearing. Digital and speculum exam show no signs of recurrence.   PAP smear deferred in light of recent exam.  Lab Findings: Lab Results  Component Value Date   WBC 17.0 (H) 03/22/2014   HGB 12.1 03/22/2014   HCT 37.3 03/22/2014   MCV 94.9 03/22/2014   PLT 186 03/22/2014     CMP     Component Value Date/Time   NA 136 03/22/2014 0412   K 4.1 03/22/2014 0412   CL 109 03/22/2014 0412  CO2 22 03/22/2014 0412   GLUCOSE 112 (H) 03/22/2014 0412   BUN 9 03/22/2014 0412   CREATININE 0.63 03/22/2014 0412   CALCIUM 8.0 (L) 03/22/2014 0412   PROT 7.6 03/01/2014 1040   ALBUMIN 3.7 03/01/2014 1040   AST 22 03/01/2014 1040   ALT 20 03/01/2014 1040   ALKPHOS 53 03/01/2014 1040   BILITOT <0.2 (L) 03/01/2014 1040   GFRNONAA 86 (L) 03/22/2014 0412   GFRAA >90 03/22/2014 9509    Radiographic Findings: Dg Foot Complete Right  Result Date: 09/15/2016 Please see detailed radiograph report in office note.   Impression/Plan:  No evidence of  disease.  I will continue to monitor the patient for RT side effects and to perform routine speculum exams. For invasive exams and PAP smears, I will defer to Gynecology and GynOnc to take precedence. I will be happy to perform PAP smears at the request of gynecology or GynOnc, if needed in addition to their visits.  The patient should follow up with her primary gynecologist this fall, and Dr. Skeet Latch in March 2019 as indicated. She will follow up with me again in 1 year.  She will try to use her vaginal dilator a bit more often.    I spent 15 minutes face to face with the patient and more than 50% of that time was spent in counseling and/or coordination of care.  _____________________________________   [name], MD  This document serves as a record of services personally performed by [name], MD. It was created on her behalf by Maryla Morrow, a trained medical scribe. The creation of this record is based on the scribe's personal observations and the provider's statements to them. This document has been checked and approved by the attending provider.

## 2016-10-10 NOTE — Progress Notes (Signed)
[name] presents for follow up of radiation completed 2/29/18 to her vaginal cuff. She denies pain. She denies concerns except occasional rectal leakage. She states it happens about once month without any symptoms like gas or feeling like she needs to have a bowel movement. She denies vaginal discharge or urinary problems. She is using a vaginal dilator once every 2-3 weeks. She was given another dilator today. She reports continued grief related to her husbands death, and has used hospice grief counseling services. She last had a PAP on 05/16/16. She had a vaginal biopsy performed on 06/02/16 which showed mild dysplasia by Dr. Judeth Horn.   BP (!) 152/72   Pulse 72   Temp 98.2 F (36.8 C)   Ht 4' 10.75" (1.492 m)   Wt 139 lb 9.6 oz (63.3 kg)   LMP 03/31/1988 (Approximate)   SpO2 95% Comment: room air  BMI 28.44 kg/m    Wt Readings from Last 3 Encounters:  10/10/16 139 lb 9.6 oz (63.3 kg)  06/17/16 134 lb 12.8 oz (61.1 kg)  06/02/16 135 lb 12.8 oz (61.6 kg)

## 2016-10-13 ENCOUNTER — Telehealth: Payer: Self-pay | Admitting: *Deleted

## 2016-10-13 NOTE — Telephone Encounter (Signed)
CALLED PATIENT TO INFORM OF FU WITH DR. Isidore Moos ON 10-16-17 @ 2 PM, SPOKE WITH PATIENT AND SHE IS AWARE OF THIS APPT.

## 2016-10-23 ENCOUNTER — Ambulatory Visit: Payer: PPO | Admitting: Podiatry

## 2016-11-10 ENCOUNTER — Encounter: Payer: Self-pay | Admitting: Podiatry

## 2016-11-10 ENCOUNTER — Ambulatory Visit (INDEPENDENT_AMBULATORY_CARE_PROVIDER_SITE_OTHER): Payer: PPO | Admitting: Podiatry

## 2016-11-10 DIAGNOSIS — Q828 Other specified congenital malformations of skin: Secondary | ICD-10-CM

## 2016-11-10 DIAGNOSIS — M79676 Pain in unspecified toe(s): Secondary | ICD-10-CM | POA: Diagnosis not present

## 2016-11-10 DIAGNOSIS — B351 Tinea unguium: Secondary | ICD-10-CM

## 2016-11-12 NOTE — Progress Notes (Signed)
Patient ID: AMANA BOUSKA, female   DOB: 1940/11/29, 76 y.o.   MRN: [id number]  Subjective: 76 y.o. returns the office today for painful, elongated, thickened toenails which she is unable to trim herself. Denies any redness or drainage around the nails. Denies any swelling redness or drainage to the nail sites. She also has calluses to both of her feet. Her foot pain has resolved. Denies any acute changes since last appointment and no new complaints today. Denies any systemic complaints such as fevers, chills, nausea, vomiting.   Objective: AAO 3, NAD DP/PT pulses palpable, CRT less than 3 seconds Nails hypertrophic, dystrophic, elongated, brittle, discolored 10. There is tenderness overlying the nails 1-5 bilaterally. There is no surrounding erythema or drainage along the nail sites. Hyperkeratotic lesions of the distal aspect left second digit and dorsal lateral 5th digit. Upon debridement there is no underlying ulceration, drainage or other signs of infection. No open lesions or pre-ulcerative lesions are identified. No areas of tenderness bilateral lower extremities otherwise. No overlying edema, erythema, increased warmth. Hammertoe contractures bilaerally which are rigid.  No pain with calf compression, swelling, warmth, erythema.  Assessment: Patient presents with symptomatic onychomycosis; hyperkeratotic lesion; right foot contusion  Plan: -Treatment options including alternatives, risks, complications were discussed -Nails sharply debrided 10 without complication/bleeding. -Hyperkeratotic lesion sharply debrided 3 without complication/bleeding. -Discussed daily foot inspection. If there are any changes, to call the office immediately.  -Follow-up in 6 weeks at her request or sooner if any problems are to arise. In the meantime, encouraged to call the office with any questions, concerns, changes symptoms.  Celesta Gentile, DPM

## 2016-12-11 DIAGNOSIS — D1801 Hemangioma of skin and subcutaneous tissue: Secondary | ICD-10-CM | POA: Diagnosis not present

## 2016-12-11 DIAGNOSIS — L821 Other seborrheic keratosis: Secondary | ICD-10-CM | POA: Diagnosis not present

## 2016-12-11 DIAGNOSIS — L814 Other melanin hyperpigmentation: Secondary | ICD-10-CM | POA: Diagnosis not present

## 2016-12-11 DIAGNOSIS — D225 Melanocytic nevi of trunk: Secondary | ICD-10-CM | POA: Diagnosis not present

## 2016-12-11 DIAGNOSIS — L819 Disorder of pigmentation, unspecified: Secondary | ICD-10-CM | POA: Diagnosis not present

## 2016-12-11 DIAGNOSIS — L570 Actinic keratosis: Secondary | ICD-10-CM | POA: Diagnosis not present

## 2016-12-11 DIAGNOSIS — L820 Inflamed seborrheic keratosis: Secondary | ICD-10-CM | POA: Diagnosis not present

## 2016-12-22 ENCOUNTER — Encounter: Payer: Self-pay | Admitting: Podiatry

## 2016-12-22 ENCOUNTER — Ambulatory Visit (INDEPENDENT_AMBULATORY_CARE_PROVIDER_SITE_OTHER): Payer: PPO | Admitting: Podiatry

## 2016-12-22 DIAGNOSIS — B351 Tinea unguium: Secondary | ICD-10-CM | POA: Diagnosis not present

## 2016-12-22 DIAGNOSIS — Q828 Other specified congenital malformations of skin: Secondary | ICD-10-CM | POA: Diagnosis not present

## 2016-12-22 DIAGNOSIS — M79676 Pain in unspecified toe(s): Secondary | ICD-10-CM | POA: Diagnosis not present

## 2016-12-22 NOTE — Progress Notes (Signed)
Patient ID: Beth Simon, female   DOB: 03/22/1941, 76 y.o.   MRN: [id number]  Subjective: 76 y.o. returns the office today for painful, elongated, thickened toenails which she is unable to trim herself. Denies any redness or drainage around the nails. Denies any swelling redness or drainage to the nail sites. She also has calluses to both of her feet. She does that she was walking last week and she started getting some pain to the bottom of the right big toe. She had simple tear and voltaren gel at home that she was using and this did make her pain subside. Denies any acute changes since last appointment and no new complaints today. Denies any systemic complaints such as fevers, chills, nausea, vomiting.   Objective: AAO 3, NAD DP/PT pulses palpable, CRT less than 3 seconds Nails hypertrophic, dystrophic, elongated, brittle, discolored 10. There is tenderness overlying the nails 1-5 bilaterally. There is no surrounding erythema or drainage along the nail sites. Hyperkeratotic lesions of the distal aspect left second digit and dorsal lateral 5th digit. Upon debridement there is no underlying ulceration, drainage or other signs of infection. No open lesions or pre-ulcerative lesions are identified. Prominence on the plantar aspect of the right hallux IPJ where she was having pain there is no pain today and there is no overlying edema, erythema, increase in warmth. Areas of bony prominence there is no significant fat pad of the plantar aspect of the toe. No areas of tenderness bilateral lower extremities otherwise. No overlying edema, erythema, increased warmth. Hammertoe contractures bilaerally which are rigid.  No pain with calf compression, swelling, warmth, erythema.  Assessment: Patient presents with symptomatic onychomycosis; hyperkeratotic lesion;Right plantar hallux pain  Plan: -Treatment options including alternatives, risks, complications were discussed -Nails sharply debrided 10  without complication/bleeding. -Hyperkeratotic lesion sharply debrided 3 without complication/bleeding. -Offloading pads the right hallux. -Discussed daily foot inspection. If there are any changes, to call the office immediately.  -Follow-up in 6 weeks at her request or sooner if any problems are to arise. In the meantime, encouraged to call the office with any questions, concerns, changes symptoms.  Celesta Gentile, DPM

## 2017-01-07 ENCOUNTER — Telehealth: Payer: Self-pay | Admitting: Obstetrics and Gynecology

## 2017-01-07 NOTE — Telephone Encounter (Signed)
Hx breast  Cancer with mastectomy.  Last annual exam 05-16-16 with Edman Circle, FNP. Last office visit, 06-02-16 with Dr Quincy Simmonds for [place].  Scheduled for annual 05-04-17 with Dr Quincy Simmonds.  Rx for mastectomy supplies to Dr Quincy Simmonds for review.

## 2017-01-07 NOTE — Telephone Encounter (Signed)
Patient requesting a script for a prosthetic bra

## 2017-01-07 NOTE — Telephone Encounter (Addendum)
Prescription reviewed and signed by Dr Quincy Simmonds. Encounter closed.

## 2017-01-07 NOTE — Telephone Encounter (Signed)
Call to patient to advise prescription complete. Patient requests prescription be mailed to home address. Address on file confirmed as correct. Encounter closed.

## 2017-01-21 DIAGNOSIS — Z23 Encounter for immunization: Secondary | ICD-10-CM | POA: Diagnosis not present

## 2017-01-30 ENCOUNTER — Telehealth: Payer: Self-pay | Admitting: *Deleted

## 2017-01-30 ENCOUNTER — Telehealth: Payer: Self-pay | Admitting: Obstetrics and Gynecology

## 2017-01-30 NOTE — Telephone Encounter (Signed)
Spoke with patient. Advised of message as seen below from [place]. Patient verbalizes understanding. Will call to schedule her appointment with Dr.Brewster in March 2019.  Routing to provider for final review. Patient agreeable to disposition. Will close encounter.

## 2017-01-30 NOTE — Telephone Encounter (Signed)
Returned the patient's call and scheduled a follow up appt for March 26th at 9:15am.

## 2017-01-30 NOTE — Telephone Encounter (Signed)
Please contact patient in follow up to the letter she sent to me inquiring about her follow up schedule with her providers.  She sees our office, Dr. Skeet Latch, and Dr. Isidore Moos. She has a history of endometrial cancer and was recently diagnosed with positive HR HPV and VAIN I.  I do recommend she come for her regular well woman visit with me in February 2019.  Her visits need to be one year apart.   I recommend she see Dr. Isidore Moos July 2019 as planned.  I like the recommendation for her to see Dr. Skeet Latch in March 2019 as well.  It makes sense for her to see Dr. Skeet Latch after she comes here so I can get her pap done prior to the visit with Dr. Skeet Latch.

## 2017-02-02 ENCOUNTER — Ambulatory Visit: Payer: PPO | Admitting: Podiatry

## 2017-02-03 ENCOUNTER — Encounter: Payer: Self-pay | Admitting: Podiatry

## 2017-02-03 ENCOUNTER — Ambulatory Visit: Payer: PPO | Admitting: Podiatry

## 2017-02-03 DIAGNOSIS — B351 Tinea unguium: Secondary | ICD-10-CM | POA: Diagnosis not present

## 2017-02-03 DIAGNOSIS — Q828 Other specified congenital malformations of skin: Secondary | ICD-10-CM

## 2017-02-03 DIAGNOSIS — M79676 Pain in unspecified toe(s): Secondary | ICD-10-CM | POA: Diagnosis not present

## 2017-02-04 NOTE — Progress Notes (Signed)
Patient ID: Beth Simon, female   DOB: 1940-06-14, 76 y.o.   MRN: [id number]  Subjective: 76 y.o. returns the office today for painful, elongated, thickened toenails which she is unable to trim herself. Denies any redness or drainage around the nails. Denies any swelling redness or drainage to the nail sites. She also has calluses to both of her feet. Denies any acute changes since last appointment and no new complaints today. Denies any systemic complaints such as fevers, chills, nausea, vomiting.   Objective: AAO 3, NAD DP/PT pulses palpable, CRT less than 3 seconds Nails hypertrophic, dystrophic, elongated, brittle, discolored 10. There is tenderness overlying the nails 1-5 bilaterally. There is no surrounding erythema or drainage along the nail sites. Hyperkeratotic lesions of the distal aspect left second digit and dorsal lateral 5th digit. Upon debridement there is no underlying ulceration, drainage or other signs of infection. No open lesions or pre-ulcerative lesions are identified. No pain with calf compression, swelling, warmth, erythema.  Assessment: Patient presents with symptomatic onychomycosis; hyperkeratotic lesions  Plan: -Treatment options including alternatives, risks, complications were discussed -Nails sharply debrided 10 without complication/bleeding. -Hyperkeratotic lesion sharply debrided 3 without complication/bleeding. -Discussed daily foot inspection. If there are any changes, to call the office immediately.  -Follow-up in 6 weeks at her request or sooner if any problems are to arise. In the meantime, encouraged to call the office with any questions, concerns, changes symptoms.  Celesta Gentile, DPM

## 2017-03-11 DIAGNOSIS — M810 Age-related osteoporosis without current pathological fracture: Secondary | ICD-10-CM | POA: Diagnosis not present

## 2017-03-11 DIAGNOSIS — E559 Vitamin D deficiency, unspecified: Secondary | ICD-10-CM | POA: Diagnosis not present

## 2017-03-11 DIAGNOSIS — R5383 Other fatigue: Secondary | ICD-10-CM | POA: Diagnosis not present

## 2017-03-13 DIAGNOSIS — M810 Age-related osteoporosis without current pathological fracture: Secondary | ICD-10-CM | POA: Diagnosis not present

## 2017-03-13 DIAGNOSIS — M25511 Pain in right shoulder: Secondary | ICD-10-CM | POA: Diagnosis not present

## 2017-03-17 ENCOUNTER — Ambulatory Visit: Payer: PPO | Admitting: Podiatry

## 2017-03-17 DIAGNOSIS — M79676 Pain in unspecified toe(s): Secondary | ICD-10-CM | POA: Diagnosis not present

## 2017-03-17 DIAGNOSIS — Q828 Other specified congenital malformations of skin: Secondary | ICD-10-CM | POA: Diagnosis not present

## 2017-03-17 DIAGNOSIS — B351 Tinea unguium: Secondary | ICD-10-CM | POA: Diagnosis not present

## 2017-03-18 ENCOUNTER — Encounter: Payer: Self-pay | Admitting: Physical Therapy

## 2017-03-18 ENCOUNTER — Ambulatory Visit: Payer: PPO | Attending: Sports Medicine | Admitting: Physical Therapy

## 2017-03-18 DIAGNOSIS — M25511 Pain in right shoulder: Secondary | ICD-10-CM | POA: Diagnosis not present

## 2017-03-18 DIAGNOSIS — M25611 Stiffness of right shoulder, not elsewhere classified: Secondary | ICD-10-CM | POA: Diagnosis not present

## 2017-03-18 DIAGNOSIS — M6281 Muscle weakness (generalized): Secondary | ICD-10-CM | POA: Diagnosis not present

## 2017-03-18 NOTE — Patient Instructions (Addendum)
ROM: Flexion - Wand (Supine)    Lie on back holding wand. Raise arms over head.  Repeat __10__ times per set. Do __1__ sets per session. Do __2__ sessions per day.  http://orth.exer.us/928   Copyright  VHI. All rights reserved.    ROM: External / Internal Rotation - Wand    Holding wand with left hand palm up, push out from body with other hand, palm down. Keep both elbows bent. When stretch is felt, hold _3___ seconds. Repeat to other side, leading with same hand. Keep elbows bent. Repeat _10___ times per set. Do __1__ sets per session. Do __2__ sessions per day.  http://orth.exer.us/748   Copyright  VHI. All rights reserved.  ROM: Extension - Wand (Standing)    Stand holding wand behind back. Raise arms as far as possible. Repeat _10___ times per set. Do __1__ sets per session. Do __2__ sessions per day.  http://orth.exer.us/930   Copyright  VHI. All rights reserved.   Scapular Retraction (Standing)    With arms at sides, pinch shoulder blades together. Hold 5 sec Repeat _10___ times per set. Do _1___ sets per session. Do ___2_ sessions per day.  http://orth.exer.us/945   Copyright  VHI. All rights reserved.   [facility] [address], [place], [place] [address] Phone # [phone] Fax [phone]

## 2017-03-18 NOTE — Progress Notes (Signed)
Patient ID: DELAINA FETSCH, female   DOB: [birth year], 76 y.o.   MRN: [id number]  Subjective: 76 y.o. returns the office today for painful, elongated, thickened toenails which she is unable to trim herself. Denies any redness or drainage around the nails. Denies any swelling redness or drainage to the nail sites. She also has calluses to both of her feet. Denies any acute changes since last appointment and no new complaints today. Denies any systemic complaints such as fevers, chills, nausea, vomiting.   She is going to [employer] with a friend for Christmas.   Objective: AAO 3, NAD DP/PT pulses palpable, CRT less than 3 seconds Nails hypertrophic, dystrophic, elongated, brittle, discolored 10. There is tenderness overlying the nails 1-5 bilaterally. There is no surrounding erythema or drainage along the nail sites. Hyperkeratotic lesions of the distal aspect left second digit and dorsal lateral 5th digit. Upon debridement there is no underlying ulceration, drainage or other signs of infection. No open lesions or pre-ulcerative lesions are identified. No pain with calf compression, swelling, warmth, erythema.  Assessment: Patient presents with symptomatic onychomycosis; hyperkeratotic lesions  Plan: -Treatment options including alternatives, risks, complications were discussed -Nails sharply debrided 10 without complication/bleeding. -Hyperkeratotic lesion sharply debrided 3 without complication/bleeding. -Discussed daily foot inspection. If there are any changes, to call the office immediately.  -Follow-up in 6 weeks at her request or sooner if any problems are to arise. In the meantime, encouraged to call the office with any questions, concerns, changes symptoms.  Celesta Gentile, DPM

## 2017-03-18 NOTE — Therapy (Signed)
[facility] [address], [address] [place], [place], [address] Phone: [phone]   Fax:  [phone]  Physical Therapy Evaluation  Patient Details  Name: Beth Simon MRN: [id number] Date of Birth: 05/03/1940 Referring Provider: Dr. Rebecca Bassett   Encounter Date: 03/18/2017  PT End of Session - 03/18/17 1057    Visit Number  1    Date for PT Re-Evaluation  05/13/17    Authorization Type  Medicare g-code on 10th visit    PT Start Time  1020    PT Stop Time  1058    PT Time Calculation (min)  38 min    Activity Tolerance  Patient tolerated treatment well    Behavior During Therapy  WFL for tasks assessed/performed       Past Medical History:  Diagnosis Date  . Adenomatous polyps 01/2016  . Anemia   . Breast cancer (HCC) 1990   bilateral mastectomy, adenoca breast-left MRM, reconstruction, chemo  . Bronchitis last 2 weeks   saw dr stoneking 02-28-2014, he said no antibiotics needed, nonproductive cough  . Complication of anesthesia   . Cystitis    cytoxen, had once or twice  . Family history of breast cancer   . History of breast cancer   . History of radiation therapy 2/10, 2/12, 2/18, 2/24, 05/29/14   vaginal cuff/ 30 Gy/ 5 fx  . Macular degeneration   . Neck pain    taking physictal therapy last 2 weeks  . Osteoporosis    on Evista  . PONV (postoperative nausea and vomiting)   . Shoulder pain    taking physical therapy for last few weeks  . Uterine cancer (HCC) 03/21/2014   MLH1/PMS2 LOH  . Uterine fibroid   . VAIN I (vaginal intraepithelial neoplasia grade I) 2018   positive HR HPV.  . Vasovagal syncopes     Past Surgical History:  Procedure Laterality Date  . BREAST IMPLANT REMOVAL  6/94   left breast  . BREAST RECONSTRUCTION  1990   left  . CATARACT EXTRACTION Bilateral   . EXCISION VAGINAL CYST    . MASTECTOMY Right 10/08/06   prophylactic  . RADICAL MASTECTOMY LND  1990   left, chemo  done  . ROBOTIC ASSISTED TOTAL HYSTERECTOMY WITH BILATERAL SALPINGO OOPHERECTOMY Bilateral 03/21/2014   Procedure: ROBOTIC ASSISTED TOTAL HYSTERECTOMY WITH BILATERAL SALPINGO OOPHORECTOMY ;  Surgeon: Wendy Brewster, MD;  Location: [facility];  Service: Gynecology;  Laterality: Bilateral;    There were no vitals filed for this visit.   Subjective Assessment - 03/18/17 1028    Subjective  Patient reports her right shoulder pain started suddenly 3 months ago.     Pertinent History  Breast cancer; osteoporosis    Patient Stated Goals  reduce pain; improve mobility     Currently in Pain?  Yes    Pain Score  7     Pain Location  Shoulder    Pain Orientation  Right    Pain Descriptors / Indicators  Aching;Nagging    Pain Type  Acute pain    Pain Onset  More than a month ago    Pain Frequency  Intermittent    Aggravating Factors   typing on laptop; reaching overhead, llfting items; sleep on right shoulder;     Pain Relieving Factors  arm at side         [facility] PT Assessment - 03/18/17 0001      Assessment   Medical Diagnosis  right   shoulder impringement/rotator cuff tendiopathy    Referring Provider  Dr. Rebecca Bassett    Onset Date/Surgical Date  12/17/16    Hand Dominance  Right    Prior Therapy  yes      Precautions   Precautions  Other (comment)    Precaution Comments  breast cancer; osteoporosis      Restrictions   Weight Bearing Restrictions  No      Balance Screen   Has the patient fallen in the past 6 months  No    Has the patient had a decrease in activity level because of a fear of falling?   No    Is the patient reluctant to leave their home because of a fear of falling?   No      Home Environment   Living Environment  Private residence      Prior Function   Level of Independence  Independent    Vocation  Retired      Cognition   Overall Cognitive Status  Within Functional Limits for tasks assessed      Observation/Other Assessments   Focus on Therapeutic  Outcomes (FOTO)   41% limitation; goal is 36% limitation      Posture/Postural Control   Posture/Postural Control  Postural limitations    Postural Limitations  Forward head;Rounded Shoulders;Increased thoracic kyphosis      ROM / Strength   AROM / PROM / Strength  AROM;PROM;Strength      AROM   AROM Assessment Site  Shoulder    Right/Left Shoulder  Right    Right Shoulder Flexion  150 Degrees    Right Shoulder ABduction  140 Degrees    Right Shoulder Internal Rotation  30 Degrees reach to L1    Right Shoulder External Rotation  45 Degrees      PROM   PROM Assessment Site  Shoulder    Right/Left Shoulder  Right    Right Shoulder Flexion  175 Degrees    Right Shoulder ABduction  180 Degrees    Right Shoulder Internal Rotation  32 Degrees    Right Shoulder External Rotation  60 Degrees      Strength   Strength Assessment Site  Shoulder    Right/Left Shoulder  Right    Right Shoulder Flexion  3+/5    Right Shoulder Extension  3+/5    Right Shoulder ABduction  3+/5    Right Shoulder Internal Rotation  3+/5    Right Shoulder External Rotation  3+/5      Palpation   Palpation comment  right pectoralis tendon, right biceps tendon; right bicipital groove;              Objective measurements completed on examination: See above findings.              PT Education - 03/18/17 1057    Education provided  Yes    Education Details  right shoulder ROM exercises using the cane    Person(s) Educated  Patient    Methods  Explanation;Demonstration;Verbal cues;Handout    Comprehension  Returned demonstration;Verbalized understanding       PT Short Term Goals - 03/18/17 1106      PT SHORT TERM GOAL #1   Title  be independent in initial HEP    Time  4    Period  Weeks    Status  New    Target Date  04/15/17      PT SHORT TERM GOAL #2   Title    right shoulder pain decreased >/= 25% with sleeping on it    Time  4    Period  Weeks    Status  New    Target Date   04/15/17      PT SHORT TERM GOAL #3   Title  right shoulder pain decreased >/= 25% with reaching overhead using right arm    Time  4    Period  Weeks    Status  New    Target Date  04/15/17      PT SHORT TERM GOAL #4   Title  --------------        PT Long Term Goals - 03/18/17 1107      PT LONG TERM GOAL #1   Title  be independent in advanced HEP    Time  4    Period  Weeks    Status  New    Target Date  05/13/17      PT LONG TERM GOAL #2   Title  reduce FOTO to < 36% to demo improved mobility and decreased pain    Time  8    Period  Weeks    Status  New    Target Date  05/13/17      PT LONG TERM GOAL #3   Title  reach overhead with right arm with pain decreased >/= 75% due to increased right shoulder strength >/= 4/5    Time  8    Period  Weeks    Status  New    Target Date  05/13/17      PT LONG TERM GOAL #4   Title  lay on right arm with pain decreased >/= 75% due to decreased inflammation     Time  8    Period  Weeks    Status  New    Target Date  05/13/17      PT LONG TERM GOAL #5   Title  reach behind her back for an item due to right shoulder rotation improved by 10-15 degrees    Time  8    Period  Days    Status  New    Target Date  05/13/17             Plan - 03/18/17 1059    Clinical Impression Statement  Patient is a 76 year old female with right shoulder pain that started suddenly 3 months ago.  Patient reports her right shoulder pain is 7/10 when she lays on right shoulder, reaching with the right arm forward and backward, and lifting items.  Right shoulder strength averages 3+/5.  Right shoulder IR and ER limited by 50% with pain for active and passive motion. Palpable tenderness located in right pectoralis insertion, right bicipital groove, right biceps tendon.  Patient will benefit from skilled therapy to improve right shoulder funcitonal movement while increasing strength and reducinc pain.     History and Personal Factors relevant to  plan of care:  Breast cancer with no radiation;  ovarian cancer with radiation osteoporosis; masectomy; macular degeneration    Clinical Presentation  Evolving    Clinical Presentation due to:  right shoulder pain makes it difficult to perform daily activities and she lives by herself    Clinical Decision Making  Moderate    Rehab Potential  Excellent    Clinical Impairments Affecting Rehab Potential  Breast cancer with no radiation;  ovarian cancer with radiation osteoporosis; masectomy; macular degeneration    PT Frequency  2x /   week    PT Duration  8 weeks    PT Treatment/Interventions  Iontophoresis 4mg/ml Dexamethasone;Therapeutic activities;Therapeutic exercise;Electrical Stimulation;Neuromuscular re-education;Patient/family education;Manual techniques;Passive range of motion;Dry needling    PT Next Visit Plan  ionto if MD signed initial summary; right shoulder AAROM; work on right shoulder rotation; pendulums; UE ranger; soft tissue work    PT Home Exercise Plan  progress as needed    Consulted and Agree with Plan of Care  Patient       Patient will benefit from skilled therapeutic intervention in order to improve the following deficits and impairments:  Pain, Increased fascial restricitons, Increased muscle spasms, Decreased strength, Decreased range of motion, Decreased activity tolerance  Visit Diagnosis: Muscle weakness (generalized) - Plan: PT plan of care cert/re-cert  Acute pain of right shoulder - Plan: PT plan of care cert/re-cert  Stiffness of right shoulder, not elsewhere classified - Plan: PT plan of care cert/re-cert  G-Codes - 03/18/17 1111    Functional Assessment Tool Used (Outpatient Only)  Foto score is 41% limitation goal is 36% limitation    Functional Limitation  Mobility: Walking and moving around    Mobility: Walking and Moving Around Current Status (G8978)  At least 20 percent but less than 40 percent impaired, limited or restricted    Mobility: Walking and  Moving Around Goal Status (G8979)  At least 40 percent but less than 60 percent impaired, limited or restricted        Problem List Patient Active Problem List   Diagnosis Date Noted  . Genetic testing 05/23/2014  . History of breast cancer   . Family history of breast cancer   . Endometrial cancer (HCC) 03/21/2014  . Uterine cancer (HCC) 03/21/2014  . S/P mastectomy, bilateral 05/02/2013  . Situational stress 05/02/2013  . Osteoporosis, unspecified 05/02/2013  . Breast cancer, left breast (HCC) 05/02/2013    Cheryl Gray, PT 03/18/17 11:13 AM   [facility] [address], [address] [place], [place], [address] Phone: [phone]   Fax:  [phone]  Name: Beth Simon MRN: [id number] Date of Birth: 11/06/1940  

## 2017-03-19 ENCOUNTER — Ambulatory Visit: Payer: PPO | Admitting: Physical Therapy

## 2017-03-19 ENCOUNTER — Encounter: Payer: Self-pay | Admitting: Physical Therapy

## 2017-03-19 DIAGNOSIS — M25511 Pain in right shoulder: Secondary | ICD-10-CM

## 2017-03-19 DIAGNOSIS — M6281 Muscle weakness (generalized): Secondary | ICD-10-CM

## 2017-03-19 DIAGNOSIS — M25611 Stiffness of right shoulder, not elsewhere classified: Secondary | ICD-10-CM

## 2017-03-19 NOTE — Patient Instructions (Signed)
External Rotation (Isometric)    Place back of left fist against door frame, with elbow bent. Press fist against door frame. Hold __5__ seconds. Repeat __10__ times. Do __2__ sessions per day.  http://gt2.exer.us/110   Copyright  VHI. All rights reserved.  Flexion (Isometric)    Press right fist against wall. Hold __5__ seconds. Repeat __10__ times. Do _2___ sessions per day.  http://gt2.exer.us/114   Copyright  VHI. All rights reserved.   Strengthening: Isometric Abduction    Using wall for resistance, press left arm into ball using light pressure. Hold __5__ seconds. Repeat __10__ times per set. Do __1__ sets per session. Do __2__ sessions per day.  http://orth.exer.us/807   Copyright  VHI. All rights reserved.   [facility] [address] [place], [place] [address] Phone # [phone] Fax [phone]

## 2017-03-19 NOTE — Therapy (Signed)
[facility] [address], [place], [place], [address] Phone: [phone]   Fax:  [phone]  Physical Therapy Treatment  Patient Details  Name: Beth Simon MRN: [id number] Date of Birth: [birth year] Referring Provider: Dr. Wandra Feinstein   Encounter Date: 03/19/2017  PT End of Session - 03/19/17 1023    Visit Number  2    Date for PT Re-Evaluation  05/13/17    Authorization Type  Medicare g-code on 10th visit    PT Start Time  1020    PT Stop Time  1059    PT Time Calculation (min)  39 min    Activity Tolerance  Patient tolerated treatment well    Behavior During Therapy  [facility] for tasks assessed/performed       Past Medical History:  Diagnosis Date  . Adenomatous polyps 01/2016  . Anemia   . Breast cancer ([place]) 1990   bilateral mastectomy, adenoca breast-left MRM, reconstruction, chemo  . Bronchitis last 2 weeks   saw dr Felipa Eth 02-28-2014, he said no antibiotics needed, nonproductive cough  . Complication of anesthesia   . Cystitis    cytoxen, had once or twice  . Family history of breast cancer   . History of breast cancer   . History of radiation therapy 2/10, 2/12, 2/18, 2/24, 05/29/14   vaginal cuff/ 30 Gy/ 5 fx  . Macular degeneration   . Neck pain    taking physictal therapy last 2 weeks  . Osteoporosis    on Evista  . PONV (postoperative nausea and vomiting)   . Shoulder pain    taking physical therapy for last few weeks  . Uterine cancer ([place]) 03/21/2014   MLH1/PMS2 LOH  . Uterine fibroid   . VAIN I (vaginal intraepithelial neoplasia grade I) 2018   positive HR HPV.  . Vasovagal syncopes     Past Surgical History:  Procedure Laterality Date  . BREAST IMPLANT REMOVAL  6/94   left breast  . BREAST RECONSTRUCTION  1990   left  . CATARACT EXTRACTION Bilateral   . EXCISION VAGINAL CYST    . MASTECTOMY Right 10/08/06   prophylactic  . RADICAL MASTECTOMY LND  1990   left, chemo done   . ROBOTIC ASSISTED TOTAL HYSTERECTOMY WITH BILATERAL SALPINGO OOPHERECTOMY Bilateral 03/21/2014   Procedure: ROBOTIC ASSISTED TOTAL HYSTERECTOMY WITH BILATERAL SALPINGO OOPHORECTOMY ;  Surgeon: Janie Morning, MD;  Location: [facility];  Service: Gynecology;  Laterality: Bilateral;    There were no vitals filed for this visit.  Subjective Assessment - 03/19/17 1024    Subjective  pt states she is having some pain this morning and points to anterior/lateral deltoid on the right shoulder.    Pertinent History  Breast cancer; osteoporosis    Patient Stated Goals  reduce pain; improve mobility     Currently in Pain?  Yes    Pain Score  3     Pain Location  Shoulder    Pain Orientation  Right    Pain Descriptors / Indicators  Aching;Nagging    Pain Onset  More than a month ago    Pain Frequency  Intermittent    Aggravating Factors   falling asleep, reaching out    Pain Relieving Factors  arm at the side, sitting up    Multiple Pain Sites  No                      [facility] Adult PT Treatment/Exercise -  03/19/17 0001      Exercises   Exercises  Shoulder      Shoulder Exercises: Supine   Flexion  AAROM;Right;Left;10 reps    Other Supine Exercises  supine with towel to support right shoulder - palms face up for gentle pec stretch      Shoulder Exercises: Seated   External Rotation  AAROM;Right;20 reps cane    Flexion  AAROM;Right;20 reps cane      Shoulder Exercises: Standing   Flexion  Strengthening;10 reps finger ladder up to L 13      Shoulder Exercises: Pulleys   Flexion  3 minutes      Shoulder Exercises: Isometric Strengthening   Flexion  5X5"    External Rotation  5X5"    Internal Rotation  5X5"      Manual Therapy   Manual Therapy  Soft tissue mobilization    Soft tissue mobilization  right pecs and anterior deltoid             PT Education - 03/19/17 1058    Education provided  Yes    Education Details  isometric shoulder exercises    Person(s)  Educated  Patient    Methods  Explanation;Demonstration    Comprehension  Verbalized understanding;Returned demonstration       PT Short Term Goals - 03/18/17 1106      PT SHORT TERM GOAL #1   Title  be independent in initial HEP    Time  4    Period  Weeks    Status  New    Target Date  04/15/17      PT SHORT TERM GOAL #2   Title  right shoulder pain decreased >/= 25% with sleeping on it    Time  4    Period  Weeks    Status  New    Target Date  04/15/17      PT SHORT TERM GOAL #3   Title  right shoulder pain decreased >/= 25% with reaching overhead using right arm    Time  4    Period  Weeks    Status  New    Target Date  04/15/17      PT SHORT TERM GOAL #4   Title  --------------        PT Long Term Goals - 03/18/17 1107      PT LONG TERM GOAL #1   Title  be independent in advanced HEP    Time  4    Period  Weeks    Status  New    Target Date  05/13/17      PT LONG TERM GOAL #2   Title  reduce FOTO to < 36% to demo improved mobility and decreased pain    Time  8    Period  Weeks    Status  New    Target Date  05/13/17      PT LONG TERM GOAL #3   Title  reach overhead with right arm with pain decreased >/= 75% due to increased right shoulder strength >/= 4/5    Time  8    Period  Weeks    Status  New    Target Date  05/13/17      PT LONG TERM GOAL #4   Title  lay on right arm with pain decreased >/= 75% due to decreased inflammation     Time  8    Period  Weeks    Status  New    Target Date  05/13/17      PT LONG TERM GOAL #5   Title  reach behind her back for an item due to right shoulder rotation improved by 10-15 degrees    Time  8    Period  Days    Status  New    Target Date  05/13/17            Plan - 03/19/17 1230    Clinical Impression Statement  Pt is here for initial treatment since eval.  She is doing well with HEP and already started doing ROM exercises with cane at home.  Pt was able to initial gentle strengthening with  isometrics.  She continues to have inflammation in shoulder and is limted by pain.  No increased pain throughout treatment today.  Pt did not have initial order signed so we did not begin ionto treatment today.  Pt will benefit for sikilled PT to progress strength and ROM    Rehab Potential  Excellent    PT Treatment/Interventions  Iontophoresis '4mg'$ /ml Dexamethasone;Therapeutic activities;Therapeutic exercise;Electrical Stimulation;Neuromuscular re-education;Patient/family education;Manual techniques;Passive range of motion;Dry needling    PT Next Visit Plan  ionto if MD signed initial summary; right shoulder AAROM; work on right shoulder rotation; pendulums; UE ranger; soft tissue work    PT [place]  progress as needed    Consulted and Agree with Plan of Care  Patient       Patient will benefit from skilled therapeutic intervention in order to improve the following deficits and impairments:  Pain, Increased fascial restricitons, Increased muscle spasms, Decreased strength, Decreased range of motion, Decreased activity tolerance  Visit Diagnosis: Muscle weakness (generalized)  Acute pain of right shoulder  Stiffness of right shoulder, not elsewhere classified   G-Codes - 03-30-17 1111    Functional Assessment Tool Used (Outpatient Only)  Foto score is 41% limitation goal is 36% limitation    Functional Limitation  Mobility: Walking and moving around    Mobility: Walking and Moving Around Current Status (G4720)  At least 20 percent but less than 40 percent impaired, limited or restricted    Mobility: Walking and Moving Around Goal Status [phone])  At least 40 percent but less than 60 percent impaired, limited or restricted       Problem List Patient Active Problem List   Diagnosis Date Noted  . Genetic testing 05/23/2014  . History of breast cancer   . Family history of breast cancer   . Endometrial cancer ([place]) 03/21/2014  . Uterine cancer ([place]) 03/21/2014  . S/P  mastectomy, bilateral 05/02/2013  . Situational stress 05/02/2013  . Osteoporosis, unspecified 05/02/2013  . Breast cancer, left breast ([place]) 05/02/2013    Zannie Cove, PT 03/19/2017, 12:35 PM  [facility] [address] [place], [place], [address] Phone: [phone]   Fax:  [phone]  Name: Beth Simon MRN: [id number] Date of Birth: Dec 05, 1940

## 2017-03-27 ENCOUNTER — Ambulatory Visit: Payer: PPO | Admitting: Physical Therapy

## 2017-03-27 ENCOUNTER — Encounter: Payer: Self-pay | Admitting: Physical Therapy

## 2017-03-27 DIAGNOSIS — M25511 Pain in right shoulder: Secondary | ICD-10-CM

## 2017-03-27 DIAGNOSIS — M6281 Muscle weakness (generalized): Secondary | ICD-10-CM | POA: Diagnosis not present

## 2017-03-27 DIAGNOSIS — M25611 Stiffness of right shoulder, not elsewhere classified: Secondary | ICD-10-CM

## 2017-03-27 NOTE — Therapy (Signed)
[facility] [address] [place], [place], [address] Phone: [phone]   Fax:  [phone]  Physical Therapy Treatment  Patient Details  Name: Beth Simon MRN: [id number] Date of Birth: 12-02-1940 Referring Provider: Dr. Wandra Feinstein   Encounter Date: 03/27/2017  PT End of Session - 03/27/17 0804    Visit Number  3    Date for PT Re-Evaluation  05/13/17    Authorization Type  Medicare g-code on 10th visit    PT Start Time  0803    PT Stop Time  0848    PT Time Calculation (min)  45 min    Activity Tolerance  Patient tolerated treatment well    Behavior During Therapy  [facility] for tasks assessed/performed       Past Medical History:  Diagnosis Date  . Adenomatous polyps 01/2016  . Anemia   . Breast cancer ([place]) 1990   bilateral mastectomy, adenoca breast-left MRM, reconstruction, chemo  . Bronchitis last 2 weeks   saw dr Felipa Eth 02-28-2014, he said no antibiotics needed, nonproductive cough  . Complication of anesthesia   . Cystitis    cytoxen, had once or twice  . Family history of breast cancer   . History of breast cancer   . History of radiation therapy 2/10, 2/12, 2/18, 2/24, 05/29/14   vaginal cuff/ 30 Gy/ 5 fx  . Macular degeneration   . Neck pain    taking physictal therapy last 2 weeks  . Osteoporosis    on Evista  . PONV (postoperative nausea and vomiting)   . Shoulder pain    taking physical therapy for last few weeks  . Uterine cancer ([place]) 03/21/2014   MLH1/PMS2 LOH  . Uterine fibroid   . VAIN I (vaginal intraepithelial neoplasia grade I) 2018   positive HR HPV.  . Vasovagal syncopes     Past Surgical History:  Procedure Laterality Date  . BREAST IMPLANT REMOVAL  6/94   left breast  . BREAST RECONSTRUCTION  1990   left  . CATARACT EXTRACTION Bilateral   . EXCISION VAGINAL CYST    . MASTECTOMY Right 10/08/06   prophylactic  . RADICAL MASTECTOMY LND  1990   left, chemo done   . ROBOTIC ASSISTED TOTAL HYSTERECTOMY WITH BILATERAL SALPINGO OOPHERECTOMY Bilateral 03/21/2014   Procedure: ROBOTIC ASSISTED TOTAL HYSTERECTOMY WITH BILATERAL SALPINGO OOPHORECTOMY ;  Surgeon: Janie Morning, MD;  Location: [facility];  Service: Gynecology;  Laterality: Bilateral;    There were no vitals filed for this visit.  Subjective Assessment - 03/27/17 0806    Subjective  A soreness this AM. Worst in AM, pain/soreness intermittent.    Currently in Pain?  Yes    Pain Score  3     Pain Location  Scapula    Pain Orientation  Right    Aggravating Factors   In AM    Pain Relieving Factors  Rest    Multiple Pain Sites  No                      [facility] Adult PT Treatment/Exercise - 03/27/17 0001      Shoulder Exercises: Standing   Flexion  -- UE ranger at #21 8x with PTA asst for eccentrics.    Other Standing Exercises  Red band: rows, ext, fwd punch, IR/ER 10x each VC/TC for technique    Other Standing Exercises  Pendulums various directions       Shoulder Exercises: Pulleys  Flexion  3 minutes      Manual Therapy   Manual Therapy  Soft tissue mobilization    Soft tissue mobilization  right pecs and anterior deltoid Instrument asst upgrading to RTC, deltoid, downgrade to UT               PT Short Term Goals - 03/18/17 1106      PT SHORT TERM GOAL #1   Title  be independent in initial HEP    Time  4    Period  Weeks    Status  New    Target Date  04/15/17      PT SHORT TERM GOAL #2   Title  right shoulder pain decreased >/= 25% with sleeping on it    Time  4    Period  Weeks    Status  New    Target Date  04/15/17      PT SHORT TERM GOAL #3   Title  right shoulder pain decreased >/= 25% with reaching overhead using right arm    Time  4    Period  Weeks    Status  New    Target Date  04/15/17      PT SHORT TERM GOAL #4   Title  --------------        PT Long Term Goals - 03/18/17 1107      PT LONG TERM GOAL #1   Title  be independent  in advanced HEP    Time  4    Period  Weeks    Status  New    Target Date  05/13/17      PT LONG TERM GOAL #2   Title  reduce FOTO to < 36% to demo improved mobility and decreased pain    Time  8    Period  Weeks    Status  New    Target Date  05/13/17      PT LONG TERM GOAL #3   Title  reach overhead with right arm with pain decreased >/= 75% due to increased right shoulder strength >/= 4/5    Time  8    Period  Weeks    Status  New    Target Date  05/13/17      PT LONG TERM GOAL #4   Title  lay on right arm with pain decreased >/= 75% due to decreased inflammation     Time  8    Period  Weeks    Status  New    Target Date  05/13/17      PT LONG TERM GOAL #5   Title  reach behind her back for an item due to right shoulder rotation improved by 10-15 degrees    Time  8    Period  Days    Status  New    Target Date  05/13/17            Plan - 03/27/17 0804    Clinical Impression Statement  Pt doing better overall. She is compliant with her HEP. PTA discussed with pt to avoid extreme reaching motions that tend to hurt the shoulder and instead move her feet to square up on the object she is reaching for. She agreed and verbally understood.   Introduced red band below 90 degree exercises for scap strength and RTC strength. No pain with these exercises and may be added next session to advance HEP if she did ok. Pt did have pain with  eccentric lowering on the ranger needing some support from PTA.     Rehab Potential  Excellent    Clinical Impairments Affecting Rehab Potential  Breast cancer with no radiation;  ovarian cancer with radiation osteoporosis; masectomy; macular degeneration    PT Frequency  2x / week    PT Duration  8 weeks    PT Treatment/Interventions  Iontophoresis '4mg'$ /ml Dexamethasone;Therapeutic activities;Therapeutic exercise;Electrical Stimulation;Neuromuscular re-education;Patient/family education;Manual techniques;Passive range of motion;Dry needling    PT  Next Visit Plan  Check ionto order, review/add ER AROm with cane exercise, give band for HEP if ok from today session.     Consulted and Agree with Plan of Care  Patient       Patient will benefit from skilled therapeutic intervention in order to improve the following deficits and impairments:  Pain, Increased fascial restricitons, Increased muscle spasms, Decreased strength, Decreased range of motion, Decreased activity tolerance  Visit Diagnosis: Muscle weakness (generalized)  Acute pain of right shoulder  Stiffness of right shoulder, not elsewhere classified     Problem List Patient Active Problem List   Diagnosis Date Noted  . Genetic testing 05/23/2014  . History of breast cancer   . Family history of breast cancer   . Endometrial cancer ([place]) 03/21/2014  . Uterine cancer ([place]) 03/21/2014  . S/P mastectomy, bilateral 05/02/2013  . Situational stress 05/02/2013  . Osteoporosis, unspecified 05/02/2013  . Breast cancer, left breast ([place]) 05/02/2013    COCHRAN,JENNIFER, PTA 03/27/2017, 11:53 AM  [facility] [address] [place], [place], [address] Phone: [phone]   Fax:  [phone]  Name: TARRIE MCMICHEN MRN: [id number] Date of Birth: Jul 16, 1940

## 2017-04-01 ENCOUNTER — Ambulatory Visit: Payer: PPO | Attending: Sports Medicine | Admitting: Physical Therapy

## 2017-04-01 ENCOUNTER — Encounter: Payer: Self-pay | Admitting: Physical Therapy

## 2017-04-01 DIAGNOSIS — M25511 Pain in right shoulder: Secondary | ICD-10-CM | POA: Diagnosis not present

## 2017-04-01 DIAGNOSIS — M6281 Muscle weakness (generalized): Secondary | ICD-10-CM | POA: Insufficient documentation

## 2017-04-01 DIAGNOSIS — M25551 Pain in right hip: Secondary | ICD-10-CM | POA: Insufficient documentation

## 2017-04-01 DIAGNOSIS — M25611 Stiffness of right shoulder, not elsewhere classified: Secondary | ICD-10-CM | POA: Insufficient documentation

## 2017-04-01 NOTE — Therapy (Signed)
[facility] [address] [place], [place], [address] Phone: [phone]   Fax:  [phone]  Physical Therapy Treatment  Patient Details  Name: Beth Simon MRN: [id number] Date of Birth: 09-26-1940 Referring Provider: Dr. Wandra Feinstein   Encounter Date: 04/01/2017  PT End of Session - 04/01/17 1336    Visit Number  4    Date for PT Re-Evaluation  05/13/17    Authorization Type  Medicare g-code on 10th visit    PT Start Time  1230    PT Stop Time  1310    PT Time Calculation (min)  40 min    Activity Tolerance  Patient tolerated treatment well    Behavior During Therapy  [facility] for tasks assessed/performed       Past Medical History:  Diagnosis Date  . Adenomatous polyps 01/2016  . Anemia   . Breast cancer ([place]) 1990   bilateral mastectomy, adenoca breast-left MRM, reconstruction, chemo  . Bronchitis last 2 weeks   saw dr Felipa Eth 02-28-2014, he said no antibiotics needed, nonproductive cough  . Complication of anesthesia   . Cystitis    cytoxen, had once or twice  . Family history of breast cancer   . History of breast cancer   . History of radiation therapy 2/10, 2/12, 2/18, 2/24, 05/29/14   vaginal cuff/ 30 Gy/ 5 fx  . Macular degeneration   . Neck pain    taking physictal therapy last 2 weeks  . Osteoporosis    on Evista  . PONV (postoperative nausea and vomiting)   . Shoulder pain    taking physical therapy for last few weeks  . Uterine cancer ([place]) 03/21/2014   MLH1/PMS2 LOH  . Uterine fibroid   . VAIN I (vaginal intraepithelial neoplasia grade I) 2018   positive HR HPV.  . Vasovagal syncopes     Past Surgical History:  Procedure Laterality Date  . BREAST IMPLANT REMOVAL  6/94   left breast  . BREAST RECONSTRUCTION  1990   left  . CATARACT EXTRACTION Bilateral   . EXCISION VAGINAL CYST    . MASTECTOMY Right 10/08/06   prophylactic  . RADICAL MASTECTOMY LND  1990   left, chemo done   . ROBOTIC ASSISTED TOTAL HYSTERECTOMY WITH BILATERAL SALPINGO OOPHERECTOMY Bilateral 03/21/2014   Procedure: ROBOTIC ASSISTED TOTAL HYSTERECTOMY WITH BILATERAL SALPINGO OOPHORECTOMY ;  Surgeon: Janie Morning, MD;  Location: [facility];  Service: Gynecology;  Laterality: Bilateral;    There were no vitals filed for this visit.  Subjective Assessment - 04/01/17 1237    Subjective  I still have soreness.     Pertinent History  Breast cancer; osteoporosis    Patient Stated Goals  reduce pain; improve mobility     Currently in Pain?  Yes    Pain Score  4     Pain Location  Scapula    Pain Orientation  Right    Pain Descriptors / Indicators  Aching;Nagging    Pain Type  Acute pain    Pain Onset  More than a month ago    Pain Frequency  Intermittent    Aggravating Factors   in the AM    Pain Relieving Factors  rest    Multiple Pain Sites  No                      [facility] Adult PT Treatment/Exercise - 04/01/17 0001      Self-Care   Self-Care  Other Self-Care Comments    Other Self-Care Comments   went to patients car and reversed her headrest      Shoulder Exercises: Standing   Internal Rotation  Right;15 reps reaching behind back with AAROM     Flexion  -- UE ranger at #21 8x with PTA asst for eccentrics.    Other Standing Exercises  Red band: rows, ext, fwd punch, IR/ER 10x each VC/TC for technique      Shoulder Exercises: Pulleys   Flexion  3 minutes    Other Pulley Exercises  IR 1 min with therapist guiding shoulder      Shoulder Exercises: Stretch   Corner Stretch  5 reps;20 seconds therapist helping the right shoulder               PT Short Term Goals - 03/18/17 1106      PT SHORT TERM GOAL #1   Title  be independent in initial HEP    Time  4    Period  Weeks    Status  New    Target Date  04/15/17      PT SHORT TERM GOAL #2   Title  right shoulder pain decreased >/= 25% with sleeping on it    Time  4    Period  Weeks    Status  New    Target  Date  04/15/17      PT SHORT TERM GOAL #3   Title  right shoulder pain decreased >/= 25% with reaching overhead using right arm    Time  4    Period  Weeks    Status  New    Target Date  04/15/17      PT SHORT TERM GOAL #4   Title  --------------        PT Long Term Goals - 03/18/17 1107      PT LONG TERM GOAL #1   Title  be independent in advanced HEP    Time  4    Period  Weeks    Status  New    Target Date  05/13/17      PT LONG TERM GOAL #2   Title  reduce FOTO to < 36% to demo improved mobility and decreased pain    Time  8    Period  Weeks    Status  New    Target Date  05/13/17      PT LONG TERM GOAL #3   Title  reach overhead with right arm with pain decreased >/= 75% due to increased right shoulder strength >/= 4/5    Time  8    Period  Weeks    Status  New    Target Date  05/13/17      PT LONG TERM GOAL #4   Title  lay on right arm with pain decreased >/= 75% due to decreased inflammation     Time  8    Period  Weeks    Status  New    Target Date  05/13/17      PT LONG TERM GOAL #5   Title  reach behind her back for an item due to right shoulder rotation improved by 10-15 degrees    Time  8    Period  Days    Status  New    Target Date  05/13/17            Plan - 04/01/17 1337    Clinical Impression Statement  Patient has tightness in anterior right shoulder.  Patient needs many verbal cues to retract cervical.  Patient needs tactile cues to retract the scapula with exercise. Patient continues to have soreness in anterior right shoulder.  Therapist sent initial summary again so MD can sign if agrees to the ionto to be added to the plan. Patient will benefit from skilled therapy to reduce pain and improve postural muscle strength.     Rehab Potential  Excellent    Clinical Impairments Affecting Rehab Potential  Breast cancer with no radiation;  ovarian cancer with radiation osteoporosis; masectomy; macular degeneration    PT Frequency  2x / week     PT Duration  8 weeks    PT Treatment/Interventions  Iontophoresis '4mg'$ /ml Dexamethasone;Therapeutic activities;Therapeutic exercise;Electrical Stimulation;Neuromuscular re-education;Patient/family education;Manual techniques;Passive range of motion;Dry needling    PT Next Visit Plan  Check ionto order, check on STG's, review/add ER AROm with cane exercise, give band for HEP if ok from today session.     PT Home Exercise Plan  progress as needed    Recommended Other Services  sent second request for MD to sign initial summary    Consulted and Agree with Plan of Care  Patient       Patient will benefit from skilled therapeutic intervention in order to improve the following deficits and impairments:  Pain, Increased fascial restricitons, Increased muscle spasms, Decreased strength, Decreased range of motion, Decreased activity tolerance  Visit Diagnosis: Muscle weakness (generalized)  Stiffness of right shoulder, not elsewhere classified  Acute pain of right shoulder     Problem List Patient Active Problem List   Diagnosis Date Noted  . Genetic testing 05/23/2014  . History of breast cancer   . Family history of breast cancer   . Endometrial cancer ([place]) 03/21/2014  . Uterine cancer ([place]) 03/21/2014  . S/P mastectomy, bilateral 05/02/2013  . Situational stress 05/02/2013  . Osteoporosis, unspecified 05/02/2013  . Breast cancer, left breast ([place]) 05/02/2013    Earlie Counts, PT 04/01/17 1:42 PM   [facility] [address], [place], [place], [address] Phone: [phone]   Fax:  [phone]  Name: MARLAINE AREY MRN: [id number] Date of Birth: 02/14/1941

## 2017-04-03 ENCOUNTER — Encounter: Payer: Self-pay | Admitting: Physical Therapy

## 2017-04-03 ENCOUNTER — Ambulatory Visit: Payer: PPO | Admitting: Physical Therapy

## 2017-04-03 DIAGNOSIS — M25611 Stiffness of right shoulder, not elsewhere classified: Secondary | ICD-10-CM

## 2017-04-03 DIAGNOSIS — M6281 Muscle weakness (generalized): Secondary | ICD-10-CM | POA: Diagnosis not present

## 2017-04-03 DIAGNOSIS — M25511 Pain in right shoulder: Secondary | ICD-10-CM

## 2017-04-03 NOTE — Therapy (Signed)
[facility] [address] [place], [place], [address] Phone: [phone]   Fax:  [phone]  Physical Therapy Treatment  Patient Details  Name: Beth Simon MRN: [id number] Date of Birth: [birth year] Referring Provider: Dr. Wandra Feinstein   Encounter Date: 04/03/2017  PT End of Session - 04/03/17 1013    Visit Number  5    Date for PT Re-Evaluation  05/13/17    PT Start Time  1013    PT Stop Time  1059    PT Time Calculation (min)  46 min    Activity Tolerance  Patient tolerated treatment well    Behavior During Therapy  [facility] for tasks assessed/performed       Past Medical History:  Diagnosis Date  . Adenomatous polyps 01/2016  . Anemia   . Breast cancer ([place]) 1990   bilateral mastectomy, adenoca breast-left MRM, reconstruction, chemo  . Bronchitis last 2 weeks   saw dr Felipa Eth 02-28-2014, he said no antibiotics needed, nonproductive cough  . Complication of anesthesia   . Cystitis    cytoxen, had once or twice  . Family history of breast cancer   . History of breast cancer   . History of radiation therapy 2/10, 2/12, 2/18, 2/24, 05/29/14   vaginal cuff/ 30 Gy/ 5 fx  . Macular degeneration   . Neck pain    taking physictal therapy last 2 weeks  . Osteoporosis    on Evista  . PONV (postoperative nausea and vomiting)   . Shoulder pain    taking physical therapy for last few weeks  . Uterine cancer ([place]) 03/21/2014   MLH1/PMS2 LOH  . Uterine fibroid   . VAIN I (vaginal intraepithelial neoplasia grade I) 2018   positive HR HPV.  . Vasovagal syncopes     Past Surgical History:  Procedure Laterality Date  . BREAST IMPLANT REMOVAL  6/94   left breast  . BREAST RECONSTRUCTION  1990   left  . CATARACT EXTRACTION Bilateral   . EXCISION VAGINAL CYST    . MASTECTOMY Right 10/08/06   prophylactic  . RADICAL MASTECTOMY LND  1990   left, chemo done  . ROBOTIC ASSISTED TOTAL HYSTERECTOMY WITH BILATERAL  SALPINGO OOPHERECTOMY Bilateral 03/21/2014   Procedure: ROBOTIC ASSISTED TOTAL HYSTERECTOMY WITH BILATERAL SALPINGO OOPHORECTOMY ;  Surgeon: Janie Morning, MD;  Location: [facility];  Service: Gynecology;  Laterality: Bilateral;    There were no vitals filed for this visit.  Subjective Assessment - 04/03/17 1016    Subjective  Stretching last session really helped. I am still sore. This AM soreness is mild. Ionto order received.     Currently in Pain?  Yes    Pain Score  3     Pain Location  Shoulder    Pain Orientation  Right    Pain Descriptors / Indicators  Sore    Multiple Pain Sites  No                      [facility] Adult PT Treatment/Exercise - 04/03/17 0001      Shoulder Exercises: Seated   Other Seated Exercises  Scap squeezex 3 sec hold 2x5      Shoulder Exercises: Standing   Flexion  -- 10x 1# on UE ranger with PTA asst    Other Standing Exercises  Red band: rows, ext, fwd punch, IR/ER 15x each VC/TC for technique    Other Standing Exercises  Pendulums various directions  Wall  push ups 10x, VC for technique      Shoulder Exercises: Pulleys   Flexion  3 minutes      Shoulder Exercises: Stretch   Corner Stretch  3 reps;20 seconds    Wall Stretch - Flexion  2 reps;30 seconds 12:00 on wall, then 1:00 on wall      Iontophoresis   Type of Iontophoresis  Dexamethasone #1    Location  RT shoulder    Dose  1 ml    Time  6 hour wear Pt verbalized understanding/ skin intact      Manual Therapy   Soft tissue mobilization  right pecs and anterior deltoid Instrument asst upgrading to RTC, deltoid, downgrade to UT             PT Education - 04/03/17 1057    Education provided  Yes    Education Details  ionto info    Person(s) Educated  Patient    Methods  Explanation    Comprehension  Verbalized understanding       PT Short Term Goals - 04/03/17 1102      PT SHORT TERM GOAL #1   Title  be independent in initial HEP    Time  4    Period  Weeks     Status  Achieved        PT Long Term Goals - 03/18/17 1107      PT LONG TERM GOAL #1   Title  be independent in advanced HEP    Time  4    Period  Weeks    Status  New    Target Date  05/13/17      PT LONG TERM GOAL #2   Title  reduce FOTO to < 36% to demo improved mobility and decreased pain    Time  8    Period  Weeks    Status  New    Target Date  05/13/17      PT LONG TERM GOAL #3   Title  reach overhead with right arm with pain decreased >/= 75% due to increased right shoulder strength >/= 4/5    Time  8    Period  Weeks    Status  New    Target Date  05/13/17      PT LONG TERM GOAL #4   Title  lay on right arm with pain decreased >/= 75% due to decreased inflammation     Time  8    Period  Weeks    Status  New    Target Date  05/13/17      PT LONG TERM GOAL #5   Title  reach behind her back for an item due to right shoulder rotation improved by 10-15 degrees    Time  8    Period  Days    Status  New    Target Date  05/13/17            Plan - 04/03/17 1100    Clinical Impression Statement  Pt feeling better overall. She can reach behind her with less pain. Ionto order was received and began with first patch today. Pt perfromed all TE without increasing pain. Remains tight anteriorly.    Rehab Potential  Excellent    Clinical Impairments Affecting Rehab Potential  Breast cancer with no radiation;  ovarian cancer with radiation osteoporosis; masectomy; macular degeneration    PT Frequency  2x / week    PT Duration  8 weeks  PT Treatment/Interventions  Iontophoresis '4mg'$ /ml Dexamethasone;Therapeutic activities;Therapeutic exercise;Electrical Stimulation;Neuromuscular re-education;Patient/family education;Manual techniques;Passive range of motion;Dry needling    PT Next Visit Plan  Assess ionto. ionto #2, Give [place] for HEP next session.    Consulted and Agree with Plan of Care  Patient       Patient will benefit from skilled therapeutic intervention  in order to improve the following deficits and impairments:  Pain, Increased fascial restricitons, Increased muscle spasms, Decreased strength, Decreased range of motion, Decreased activity tolerance  Visit Diagnosis: Muscle weakness (generalized)  Stiffness of right shoulder, not elsewhere classified  Acute pain of right shoulder     Problem List Patient Active Problem List   Diagnosis Date Noted  . Genetic testing 05/23/2014  . History of breast cancer   . Family history of breast cancer   . Endometrial cancer ([place]) 03/21/2014  . Uterine cancer ([place]) 03/21/2014  . S/P mastectomy, bilateral 05/02/2013  . Situational stress 05/02/2013  . Osteoporosis, unspecified 05/02/2013  . Breast cancer, left breast ([place]) 05/02/2013    Shell Yandow, PTA 04/03/2017, 11:51 AM  [facility] [address] [place], [place], [address] Phone: [phone]   Fax:  [phone]  Name: Beth Simon MRN: [id number] Date of Birth: 06-13-1940

## 2017-04-03 NOTE — Patient Instructions (Signed)

## 2017-04-06 ENCOUNTER — Encounter: Payer: Self-pay | Admitting: Physical Therapy

## 2017-04-06 ENCOUNTER — Ambulatory Visit: Payer: PPO | Admitting: Physical Therapy

## 2017-04-06 DIAGNOSIS — M6281 Muscle weakness (generalized): Secondary | ICD-10-CM

## 2017-04-06 DIAGNOSIS — M25511 Pain in right shoulder: Secondary | ICD-10-CM

## 2017-04-06 DIAGNOSIS — M25611 Stiffness of right shoulder, not elsewhere classified: Secondary | ICD-10-CM

## 2017-04-06 NOTE — Therapy (Signed)
[facility] [address] [place], [place], [address] Phone: [phone]   Fax:  [phone]  Physical Therapy Treatment  Patient Details  Name: Beth Simon MRN: [id number] Date of Birth: 10-01-1940 Referring Provider: Dr. Wandra Feinstein   Encounter Date: 04/06/2017  PT End of Session - 04/06/17 1025    Visit Number  6    Date for PT Re-Evaluation  05/13/17    Authorization Type  Medicare g-code on 10th visit    PT Start Time  1022    PT Stop Time  1100    PT Time Calculation (min)  38 min    Activity Tolerance  Patient tolerated treatment well    Behavior During Therapy  [facility] for tasks assessed/performed       Past Medical History:  Diagnosis Date  . Adenomatous polyps 01/2016  . Anemia   . Breast cancer ([place]) 1990   bilateral mastectomy, adenoca breast-left MRM, reconstruction, chemo  . Bronchitis last 2 weeks   saw dr Felipa Eth 02-28-2014, he said no antibiotics needed, nonproductive cough  . Complication of anesthesia   . Cystitis    cytoxen, had once or twice  . Family history of breast cancer   . History of breast cancer   . History of radiation therapy 2/10, 2/12, 2/18, 2/24, 05/29/14   vaginal cuff/ 30 Gy/ 5 fx  . Macular degeneration   . Neck pain    taking physictal therapy last 2 weeks  . Osteoporosis    on Evista  . PONV (postoperative nausea and vomiting)   . Shoulder pain    taking physical therapy for last few weeks  . Uterine cancer ([place]) 03/21/2014   MLH1/PMS2 LOH  . Uterine fibroid   . VAIN I (vaginal intraepithelial neoplasia grade I) 2018   positive HR HPV.  . Vasovagal syncopes     Past Surgical History:  Procedure Laterality Date  . BREAST IMPLANT REMOVAL  6/94   left breast  . BREAST RECONSTRUCTION  1990   left  . CATARACT EXTRACTION Bilateral   . EXCISION VAGINAL CYST    . MASTECTOMY Right 10/08/06   prophylactic  . RADICAL MASTECTOMY LND  1990   left, chemo done   . ROBOTIC ASSISTED TOTAL HYSTERECTOMY WITH BILATERAL SALPINGO OOPHERECTOMY Bilateral 03/21/2014   Procedure: ROBOTIC ASSISTED TOTAL HYSTERECTOMY WITH BILATERAL SALPINGO OOPHORECTOMY ;  Surgeon: Janie Morning, MD;  Location: [facility];  Service: Gynecology;  Laterality: Bilateral;    There were no vitals filed for this visit.  Subjective Assessment - 04/06/17 1026    Subjective  My skin tolerated the patch well. Had some pain this AM but I do not have any now. My shoulder is just creaky.     Currently in Pain?  No/denies    Multiple Pain Sites  No                      [facility] Adult PT Treatment/Exercise - 04/06/17 0001      Shoulder Exercises: Standing   Flexion  -- 10x 1# no asst needed today on UE ranger    Other Standing Exercises  Red band: rows, ext, fwd punch, IR/ER 15x each VC/TC for technique    Other Standing Exercises  Pendulums various directions  Wall push ups 10x2, VC for technique      Shoulder Exercises: Pulleys   Flexion  3 minutes    Other Pulley Exercises  Behind the back 3 min  Iontophoresis   Type of Iontophoresis  Dexamethasone #2    Location  Ant Rt shoulder    Dose  1 ml    Time  6 hour wear Pt verbalized understanding/ skin intact      Manual Therapy   Soft tissue mobilization  right pecs and anterior deltoid Instrument asst upgrading to RTC, deltoid, downgrade to UT             PT Education - 04/06/17 1031    Education provided  Yes    Education Details  Red rockwood for [employer], band given    Person(s) Educated  Patient    Methods  Explanation;Demonstration;Tactile cues;Verbal cues;Handout    Comprehension  Verbalized understanding;Returned demonstration       PT Short Term Goals - 04/06/17 1357      PT SHORT TERM GOAL #2   Title  right shoulder pain decreased >/= 25% with sleeping on it    Time  4    Period  Weeks    Status  Achieved      PT SHORT TERM GOAL #3   Title  right shoulder pain decreased >/= 25% with reaching  overhead using right arm    Time  4    Period  Weeks    Status  Achieved        PT Long Term Goals - 03/18/17 1107      PT LONG TERM GOAL #1   Title  be independent in advanced HEP    Time  4    Period  Weeks    Status  New    Target Date  05/13/17      PT LONG TERM GOAL #2   Title  reduce FOTO to < 36% to demo improved mobility and decreased pain    Time  8    Period  Weeks    Status  New    Target Date  05/13/17      PT LONG TERM GOAL #3   Title  reach overhead with right arm with pain decreased >/= 75% due to increased right shoulder strength >/= 4/5    Time  8    Period  Weeks    Status  New    Target Date  05/13/17      PT LONG TERM GOAL #4   Title  lay on right arm with pain decreased >/= 75% due to decreased inflammation     Time  8    Period  Weeks    Status  New    Target Date  05/13/17      PT LONG TERM GOAL #5   Title  reach behind her back for an item due to right shoulder rotation improved by 10-15 degrees    Time  8    Period  Days    Status  New    Target Date  05/13/17            Plan - 04/06/17 1025    Clinical Impression Statement  Pt reports she is at least 25% improved up to 50% improved. Rockwood was given today to progress HEP with red band. Skin was very slightly red from ionto but pt reported no itching or soreness from it. She suggested putting th epatch slightly more forward today, so we did. Pt met all short term goals today.     Rehab Potential  Excellent    Clinical Impairments Affecting Rehab Potential  Breast cancer with no radiation;  ovarian  cancer with radiation osteoporosis; masectomy; macular degeneration    PT Frequency  2x / week    PT Duration  8 weeks    PT Treatment/Interventions  Iontophoresis 50m/ml Dexamethasone;Therapeutic activities;Therapeutic exercise;Electrical Stimulation;Neuromuscular re-education;Patient/family education;Manual techniques;Passive range of motion;Dry needling    PT Next Visit Plan  Ionto #3,  shoulder strength and stabilization ex    Consulted and Agree with Plan of Care  Patient       Patient will benefit from skilled therapeutic intervention in order to improve the following deficits and impairments:  Pain, Increased fascial restricitons, Increased muscle spasms, Decreased strength, Decreased range of motion, Decreased activity tolerance  Visit Diagnosis: Muscle weakness (generalized)  Stiffness of right shoulder, not elsewhere classified  Acute pain of right shoulder     Problem List Patient Active Problem List   Diagnosis Date Noted  . Genetic testing 05/23/2014  . History of breast cancer   . Family history of breast cancer   . [place]ometrial [place] 03/21/2014  . [place]ine cancer ([place] 03/21/2014  . S/P mastectomy, bilateral 05/02/2013  . Situational stress 05/02/2013  . Osteoporosis, unspecified 05/02/2013  . Breast cancer, [place] breast ([place] 05/02/2013    Camauri Fleece, PTA 04/06/2017, 3:53 PM  [facility] [address] [place] [address]: [phone]  Fax:  [phone] Name: BMARITZA HOSTERMANMRN: [id number] of Birth: [birth year]

## 2017-04-06 NOTE — Patient Instructions (Signed)
Strengthening: Resisted Internal Rotation   Hold tubing in left hand, elbow at side and forearm out. Rotate forearm in across body. Repeat __1__ times per set. Do ____ sets per session. Do ____ sessions per day.  http://orth.exer.us/830   Copyright  VHI. All rights reserved.  Strengthening: Resisted External Rotation   Hold tubing in right hand, elbow at side and forearm across body. Rotate forearm out. Repeat ___10-15_ times per set. Do __1-2__ sets per session. Do __1__ sessions per day.  http://orth.exer.us/828   Copyright  VHI. All rights reserved.  Strengthening: Resisted Flexion   Hold tubing with left arm at side. I would punch arm forward. Bend and straighten the elbow.  Move shoulder through pain-free range of motion. Repeat _10-15_ times per set. Do __1-2__ sets per session. Do __1__ sessions per day.  http://orth.exer.us/824   Copyright  VHI. All rights reserved.  Strengthening: Resisted Extension   Hold tubing in right hand, arm forward. Pull elbow back. Do the sawing motion we do in the clinic.  Repeat __10-15__ times per set. Do __1-2__ sets per session. Do ___1_ sessions per day.  http://orth.exer.us/832   Copyright  VHI. All rights reserved.

## 2017-04-08 ENCOUNTER — Encounter: Payer: Self-pay | Admitting: Physical Therapy

## 2017-04-08 ENCOUNTER — Ambulatory Visit: Payer: PPO | Admitting: Physical Therapy

## 2017-04-08 DIAGNOSIS — M6281 Muscle weakness (generalized): Secondary | ICD-10-CM | POA: Diagnosis not present

## 2017-04-08 DIAGNOSIS — M25511 Pain in right shoulder: Secondary | ICD-10-CM

## 2017-04-08 DIAGNOSIS — M25611 Stiffness of right shoulder, not elsewhere classified: Secondary | ICD-10-CM

## 2017-04-08 NOTE — Therapy (Signed)
[facility] [address], [place], [place], [address] Phone: [phone]   Fax:  [phone]  Physical Therapy Treatment  Patient Details  Name: Beth Simon MRN: [id number] Date of Birth: 1940-04-23 Referring Provider: Dr. Wandra Feinstein   Encounter Date: 04/08/2017  PT End of Session - 04/08/17 1027    Visit Number  7    Date for PT Re-Evaluation  05/13/17    PT Start Time  1022    PT Stop Time  1100    PT Time Calculation (min)  38 min    Activity Tolerance  Patient tolerated treatment well    Behavior During Therapy  [facility] for tasks assessed/performed       Past Medical History:  Diagnosis Date  . Adenomatous polyps 01/2016  . Anemia   . Breast cancer ([place]) 1990   bilateral mastectomy, adenoca breast-left MRM, reconstruction, chemo  . Bronchitis last 2 weeks   saw dr Felipa Eth 02-28-2014, he said no antibiotics needed, nonproductive cough  . Complication of anesthesia   . Cystitis    cytoxen, had once or twice  . Family history of breast cancer   . History of breast cancer   . History of radiation therapy 2/10, 2/12, 2/18, 2/24, 05/29/14   vaginal cuff/ 30 Gy/ 5 fx  . Macular degeneration   . Neck pain    taking physictal therapy last 2 weeks  . Osteoporosis    on Evista  . PONV (postoperative nausea and vomiting)   . Shoulder pain    taking physical therapy for last few weeks  . Uterine cancer ([place]) 03/21/2014   MLH1/PMS2 LOH  . Uterine fibroid   . VAIN I (vaginal intraepithelial neoplasia grade I) 2018   positive HR HPV.  . Vasovagal syncopes     Past Surgical History:  Procedure Laterality Date  . BREAST IMPLANT REMOVAL  6/94   left breast  . BREAST RECONSTRUCTION  1990   left  . CATARACT EXTRACTION Bilateral   . EXCISION VAGINAL CYST    . MASTECTOMY Right 10/08/06   prophylactic  . RADICAL MASTECTOMY LND  1990   left, chemo done  . ROBOTIC ASSISTED TOTAL HYSTERECTOMY WITH BILATERAL  SALPINGO OOPHERECTOMY Bilateral 03/21/2014   Procedure: ROBOTIC ASSISTED TOTAL HYSTERECTOMY WITH BILATERAL SALPINGO OOPHORECTOMY ;  Surgeon: Janie Morning, MD;  Location: [facility];  Service: Gynecology;  Laterality: Bilateral;    There were no vitals filed for this visit.  Subjective Assessment - 04/08/17 1030    Subjective  I had a good day yesterday. I mostly feel the pain in the AM and PM.      Pertinent History  Breast cancer; osteoporosis    Currently in Pain?  Yes    Pain Score  3     Pain Location  Shoulder    Pain Orientation  Right    Pain Descriptors / Indicators  Dull;Sore    Aggravating Factors   when she gets up and goes to bed    Pain Relieving Factors  moving it, exercises    Multiple Pain Sites  No                      [facility] Adult PT Treatment/Exercise - 04/08/17 0001      Shoulder Exercises: Seated   Other Seated Exercises  Flexion, scaption 1# 10x       Shoulder Exercises: Standing   Flexion  -- Finger ladder with 1# on wrist  10x    Other Standing Exercises  Cone stack x 1 min, then 1# hand wt 1 min to first shelf UE ranger 1# 2x10 no asst    Other Standing Exercises  Pendulums various directions  Wall push ups 10x2, VC for technique      Shoulder Exercises: Pulleys   Flexion  3 minutes    ABduction  3 minutes    Other Pulley Exercises  Behind the back 3 min      Iontophoresis   Type of Iontophoresis  Dexamethasone #3    Location  Top of Rt shoulder, pt felt this was better place    Dose  1 ml    Time  6 hour wear Pt verbalized understanding/ skin intact               PT Short Term Goals - 04/06/17 1357      PT SHORT TERM GOAL #2   Title  right shoulder pain decreased >/= 25% with sleeping on it    Time  4    Period  Weeks    Status  Achieved      PT SHORT TERM GOAL #3   Title  right shoulder pain decreased >/= 25% with reaching overhead using right arm    Time  4    Period  Weeks    Status  Achieved        PT Long  Term Goals - 03/18/17 1107      PT LONG TERM GOAL #1   Title  be independent in advanced HEP    Time  4    Period  Weeks    Status  New    Target Date  05/13/17      PT LONG TERM GOAL #2   Title  reduce FOTO to < 36% to demo improved mobility and decreased pain    Time  8    Period  Weeks    Status  New    Target Date  05/13/17      PT LONG TERM GOAL #3   Title  reach overhead with right arm with pain decreased >/= 75% due to increased right shoulder strength >/= 4/5    Time  8    Period  Weeks    Status  New    Target Date  05/13/17      PT LONG TERM GOAL #4   Title  lay on right arm with pain decreased >/= 75% due to decreased inflammation     Time  8    Period  Weeks    Status  New    Target Date  05/13/17      PT LONG TERM GOAL #5   Title  reach behind her back for an item due to right shoulder rotation improved by 10-15 degrees    Time  8    Period  Days    Status  New    Target Date  05/13/17            Plan - 04/08/17 1028    Clinical Impression Statement  Pt had a good day yesterday with very little pain. She describes pain mainly now hen she gets up and goes to bed. Pt getting some relief ( she thinks) from the patch but she feels the exercises benefit her more. Pt was able to participate in more resistive exercises for shoulder strength using light weights.     Rehab Potential  Excellent    Clinical  Impairments Affecting Rehab Potential  Breast cancer with no radiation;  ovarian cancer with radiation osteoporosis; masectomy; macular degeneration    PT Frequency  2x / week    PT Duration  8 weeks    PT Treatment/Interventions  Iontophoresis '4mg'$ /ml Dexamethasone;Therapeutic activities;Therapeutic exercise;Electrical Stimulation;Neuromuscular re-education;Patient/family education;Manual techniques;Passive range of motion;Dry needling    PT Next Visit Plan  Ionto #4, shoulder strength and stabs    Consulted and Agree with Plan of Care  Patient        Patient will benefit from skilled therapeutic intervention in order to improve the following deficits and impairments:  Pain, Increased fascial restricitons, Increased muscle spasms, Decreased strength, Decreased range of motion, Decreased activity tolerance  Visit Diagnosis: Muscle weakness (generalized)  Stiffness of right shoulder, not elsewhere classified  Acute pain of right shoulder     Problem List Patient Active Problem List   Diagnosis Date Noted  . Genetic testing 05/23/2014  . History of breast cancer   . Family history of breast cancer   . Endometrial cancer ([place]) 03/21/2014  . Uterine cancer ([place]) 03/21/2014  . S/P mastectomy, bilateral 05/02/2013  . Situational stress 05/02/2013  . Osteoporosis, unspecified 05/02/2013  . Breast cancer, left breast ([place]) 05/02/2013    Kazue Cerro, PTA 04/08/2017, 11:31 AM  [facility] [address], [place], [place], [address] Phone: [phone]   Fax:  [phone]  Name: SARANNE CRISLIP MRN: [id number] Date of Birth: July 11, 1940

## 2017-04-13 ENCOUNTER — Encounter: Payer: Self-pay | Admitting: Physical Therapy

## 2017-04-15 ENCOUNTER — Ambulatory Visit: Payer: PPO | Admitting: Physical Therapy

## 2017-04-15 ENCOUNTER — Encounter: Payer: Self-pay | Admitting: Physical Therapy

## 2017-04-15 DIAGNOSIS — M25511 Pain in right shoulder: Secondary | ICD-10-CM

## 2017-04-15 DIAGNOSIS — M6281 Muscle weakness (generalized): Secondary | ICD-10-CM

## 2017-04-15 DIAGNOSIS — M25611 Stiffness of right shoulder, not elsewhere classified: Secondary | ICD-10-CM

## 2017-04-15 NOTE — Therapy (Signed)
[facility] [address], [place], [place], [address] Phone: [phone]   Fax:  [phone]  Physical Therapy Treatment  Patient Details  Name: Beth Simon MRN: [id number] Date of Birth: 1940-09-24 Referring Provider: Dr. Wandra Feinstein   Encounter Date: 04/15/2017  PT End of Session - 04/15/17 1016    Visit Number  8    Date for PT Re-Evaluation  05/13/17    Authorization Type  Medicare g-code on 10th visit    PT Start Time  1016    PT Stop Time  1115    PT Time Calculation (min)  59 min    Behavior During Therapy  [facility] for tasks assessed/performed       Past Medical History:  Diagnosis Date  . Adenomatous polyps 01/2016  . Anemia   . Breast cancer ([place]) 1990   bilateral mastectomy, adenoca breast-left MRM, reconstruction, chemo  . Bronchitis last 2 weeks   saw dr Felipa Eth 02-28-2014, he said no antibiotics needed, nonproductive cough  . Complication of anesthesia   . Cystitis    cytoxen, had once or twice  . Family history of breast cancer   . History of breast cancer   . History of radiation therapy 2/10, 2/12, 2/18, 2/24, 05/29/14   vaginal cuff/ 30 Gy/ 5 fx  . Macular degeneration   . Neck pain    taking physictal therapy last 2 weeks  . Osteoporosis    on Evista  . PONV (postoperative nausea and vomiting)   . Shoulder pain    taking physical therapy for last few weeks  . Uterine cancer ([place]) 03/21/2014   MLH1/PMS2 LOH  . Uterine fibroid   . VAIN I (vaginal intraepithelial neoplasia grade I) 2018   positive HR HPV.  . Vasovagal syncopes     Past Surgical History:  Procedure Laterality Date  . BREAST IMPLANT REMOVAL  6/94   left breast  . BREAST RECONSTRUCTION  1990   left  . CATARACT EXTRACTION Bilateral   . EXCISION VAGINAL CYST    . MASTECTOMY Right 10/08/06   prophylactic  . RADICAL MASTECTOMY LND  1990   left, chemo done  . ROBOTIC ASSISTED TOTAL HYSTERECTOMY WITH BILATERAL  SALPINGO OOPHERECTOMY Bilateral 03/21/2014   Procedure: ROBOTIC ASSISTED TOTAL HYSTERECTOMY WITH BILATERAL SALPINGO OOPHORECTOMY ;  Surgeon: Janie Morning, MD;  [facility];  Service: Gynecology;  Laterality: Bilateral;    There were no vitals filed for this visit.  Subjective Assessment - 04/15/17 1018    Subjective  Overall pain 30%-40% improved with still most pain first thing in AM and when going to bed. The last patch irritated my skin, it itched a lot so she would like to hold on the patch.     Patient Stated Goals  reduce pain; improve mobility     Currently in Pain?  Yes    Pain Score  3     Pain Location  Shoulder    Pain Orientation  Right    Pain Descriptors / Indicators  Dull    Aggravating Factors   AM PM    Pain Relieving Factors  Moving it, exercises    Multiple Pain Sites  No         [facility] PT Assessment - 04/15/17 0001      Strength   Right Shoulder Flexion  4/5    Right Shoulder Extension  4/5    Right Shoulder ABduction  4/5  [facility] Adult PT Treatment/Exercise - 04/15/17 0001      Shoulder Exercises: Seated   Other Seated Exercises  Flexion, scaption 1# 10x 2      Shoulder Exercises: Standing   External Rotation  Strengthening;Both;20 reps;Theraband    Theraband Level (Shoulder External Rotation)  Level 2 (Red)    Row  Strengthening;Both;20 reps;Theraband    Theraband Level (Shoulder Row)  Level 2 (Red)    Other Standing Exercises  1# to first shelf 3x10    Other Standing Exercises  Pendulums various directions  Wall push ups 10x2, VC for technique      Shoulder Exercises: Pulleys   Flexion  3 minutes    ABduction  3 minutes    Other Pulley Exercises  Behind the back 3 min      Electrical Stimulation   Electrical Stimulation Location  RT shoulder    Electrical Stimulation Action  IFC    Electrical Stimulation Parameters  80-150 HZ    Electrical Stimulation Goals  Pain      Manual Therapy   Soft tissue mobilization   right pecs, UT, and RTC  Instrument asst upgrading to RTC, deltoid, downgrade to UT               PT Short Term Goals - 04/06/17 1357      PT SHORT TERM GOAL #2   Title  right shoulder pain decreased >/= 25% with sleeping on it    Time  4    Period  Weeks    Status  Achieved      PT SHORT TERM GOAL #3   Title  right shoulder pain decreased >/= 25% with reaching overhead using right arm    Time  4    Period  Weeks    Status  Achieved        PT Long Term Goals - 03/18/17 1107      PT LONG TERM GOAL #1   Title  be independent in advanced HEP    Time  4    Period  Weeks    Status  New    Target Date  05/13/17      PT LONG TERM GOAL #2   Title  reduce FOTO to < 36% to demo improved mobility and decreased pain    Time  8    Period  Weeks    Status  New    Target Date  05/13/17      PT LONG TERM GOAL #3   Title  reach overhead with right arm with pain decreased >/= 75% due to increased right shoulder strength >/= 4/5    Time  8    Period  Weeks    Status  New    Target Date  05/13/17      PT LONG TERM GOAL #4   Title  lay on right arm with pain decreased >/= 75% due to decreased inflammation     Time  8    Period  Weeks    Status  New    Target Date  05/13/17      PT LONG TERM GOAL #5   Title  reach behind her back for an item due to right shoulder rotation improved by 10-15 degrees    Time  8    Period  Days    Status  New    Target Date  05/13/17            Plan - 04/15/17 1031  Clinical Impression Statement  Pt had skin irritation after her last session from the ionto patch. We will hold on that at this time. Overall pt reporting 30%-40% improvement with most pain still in AM upon waking and in PM before bed.  Increased reps/sets today rather than increasing weight for more functional activity tolerance. Pt MMT much improved from eval, see chart for values. Estim was tried instead of the ionto patch for pain reduction.     Rehab Potential   Excellent    Clinical Impairments Affecting Rehab Potential  Breast cancer with no radiation;  ovarian cancer with radiation osteoporosis; masectomy; macular degeneration    PT Frequency  2x / week    PT Duration  8 weeks    PT Treatment/Interventions  Iontophoresis '4mg'$ /ml Dexamethasone;Therapeutic activities;Therapeutic exercise;Electrical Stimulation;Neuromuscular re-education;Patient/family education;Manual techniques;Passive range of motion;Dry needling    PT Next Visit Plan  Shoulder strength and stabs, see if estim was helpful for pain    Consulted and Agree with Plan of Care  Patient       Patient will benefit from skilled therapeutic intervention in order to improve the following deficits and impairments:  Pain, Increased fascial restricitons, Increased muscle spasms, Decreased strength, Decreased range of motion, Decreased activity tolerance  Visit Diagnosis: Muscle weakness (generalized)  Stiffness of right shoulder, not elsewhere classified  Acute pain of right shoulder     Problem List Patient Active Problem List   Diagnosis Date Noted  . Genetic testing 05/23/2014  . History of breast cancer   . Family history of breast cancer   . Endometrial cancer ([place]) 03/21/2014  . Uterine cancer ([place]) 03/21/2014  . S/P mastectomy, bilateral 05/02/2013  . Situational stress 05/02/2013  . Osteoporosis, unspecified 05/02/2013  . Breast cancer, left breast ([place]) 05/02/2013    Ehsan Corvin, PTA 04/15/2017, 11:00 AM  [facility] [address] [place], [place], [address] Phone: [phone]   Fax:  [phone]  Name: Beth Simon MRN: [id number] Date of Birth: [birth year]

## 2017-04-17 ENCOUNTER — Encounter: Payer: Self-pay | Admitting: Physical Therapy

## 2017-04-17 ENCOUNTER — Ambulatory Visit: Payer: PPO | Admitting: Physical Therapy

## 2017-04-17 DIAGNOSIS — M25511 Pain in right shoulder: Secondary | ICD-10-CM

## 2017-04-17 DIAGNOSIS — M6281 Muscle weakness (generalized): Secondary | ICD-10-CM | POA: Diagnosis not present

## 2017-04-17 DIAGNOSIS — M25611 Stiffness of right shoulder, not elsewhere classified: Secondary | ICD-10-CM

## 2017-04-17 NOTE — Therapy (Signed)
[facility] [address] [place], [place], [address] Phone: [phone]   Fax:  [phone]  Physical Therapy Treatment  Patient Details  Name: Beth Simon MRN: [id number] Date of Birth: 03-30-1941 Referring Provider: Dr. Wandra Feinstein   Encounter Date: 04/17/2017  PT End of Session - 04/17/17 1021    Visit Number  9    Date for PT Re-Evaluation  05/13/17    Authorization Type  Medicare g-code on 10th visit    PT Start Time  1016    PT Stop Time  1106    PT Time Calculation (min)  50 min    Activity Tolerance  Patient tolerated treatment well    Behavior During Therapy  [facility] for tasks assessed/performed       Past Medical History:  Diagnosis Date  . Adenomatous polyps 01/2016  . Anemia   . Breast cancer ([place]) 1990   bilateral mastectomy, adenoca breast-left MRM, reconstruction, chemo  . Bronchitis last 2 weeks   saw dr Felipa Eth 02-28-2014, he said no antibiotics needed, nonproductive cough  . Complication of anesthesia   . Cystitis    cytoxen, had once or twice  . Family history of breast cancer   . History of breast cancer   . History of radiation therapy 2/10, 2/12, 2/18, 2/24, 05/29/14   vaginal cuff/ 30 Gy/ 5 fx  . Macular degeneration   . Neck pain    taking physictal therapy last 2 weeks  . Osteoporosis    on Evista  . PONV (postoperative nausea and vomiting)   . Shoulder pain    taking physical therapy for last few weeks  . Uterine cancer ([place]) 03/21/2014   MLH1/PMS2 LOH  . Uterine fibroid   . VAIN I (vaginal intraepithelial neoplasia grade I) 2018   positive HR HPV.  . Vasovagal syncopes     Past Surgical History:  Procedure Laterality Date  . BREAST IMPLANT REMOVAL  6/94   left breast  . BREAST RECONSTRUCTION  1990   left  . CATARACT EXTRACTION Bilateral   . EXCISION VAGINAL CYST    . MASTECTOMY Right 10/08/06   prophylactic  . RADICAL MASTECTOMY LND  1990   left, chemo done   . ROBOTIC ASSISTED TOTAL HYSTERECTOMY WITH BILATERAL SALPINGO OOPHERECTOMY Bilateral 03/21/2014   Procedure: ROBOTIC ASSISTED TOTAL HYSTERECTOMY WITH BILATERAL SALPINGO OOPHORECTOMY ;  Surgeon: Janie Morning, MD;  Location: [facility];  Service: Gynecology;  Laterality: Bilateral;    There were no vitals filed for this visit.  Subjective Assessment - 04/17/17 1017    Subjective  pain is not as bothersome the past couple of days.     Pertinent History  Breast cancer; osteoporosis    Patient Stated Goals  reduce pain; improve mobility     Currently in Pain?  Yes    Pain Score  2     Pain Location  Shoulder    Pain Orientation  Right    Pain Descriptors / Indicators  Dull    Pain Type  Acute pain    Pain Onset  More than a month ago    Pain Frequency  Intermittent    Aggravating Factors   reaching overhead    Pain Relieving Factors  moving arm to not get stiff, work with stretch band    Multiple Pain Sites  No                      [facility] Adult  PT Treatment/Exercise - 04/17/17 0001      Shoulder Exercises: Seated   Other Seated Exercises  Flexion, scaption 2# 10x 1 therapist gives tactle cues to upper trap to keep relaxed      Shoulder Exercises: Standing   External Rotation  Strengthening;Both;20 reps;Theraband    Theraband Level (Shoulder External Rotation)  Level 2 (Red)    Row  Strengthening;Both;20 reps;Theraband tactile cues for raising chest and squeeze shoulder blades    Theraband Level (Shoulder Row)  Level 2 (Red)    Other Standing Exercises  1# to first shelf 3x10; roll ball overhead against wall 10 times had some pain in upper shoulder    Other Standing Exercises  Pendulums various directions  Wall push ups 10x2, VC for technique      Shoulder Exercises: Pulleys   Flexion  3 minutes    ABduction  3 minutes    Other Pulley Exercises  Behind the back 3 min      Modalities   Modalities  Electrical Stimulation      Electrical Stimulation   Electrical  Stimulation Location  RT shoulder    Electrical Stimulation Action  IFC    Electrical Stimulation Parameters  to patient tolerance, 15 min in sitting    Electrical Stimulation Goals  Pain               PT Short Term Goals - 04/06/17 1357      PT SHORT TERM GOAL #2   Title  right shoulder pain decreased >/= 25% with sleeping on it    Time  4    Period  Weeks    Status  Achieved      PT SHORT TERM GOAL #3   Title  right shoulder pain decreased >/= 25% with reaching overhead using right arm    Time  4    Period  Weeks    Status  Achieved        PT Long Term Goals - 03/18/17 1107      PT LONG TERM GOAL #1   Title  be independent in advanced HEP    Time  4    Period  Weeks    Status  New    Target Date  05/13/17      PT LONG TERM GOAL #2   Title  reduce FOTO to < 36% to demo improved mobility and decreased pain    Time  8    Period  Weeks    Status  New    Target Date  05/13/17      PT LONG TERM GOAL #3   Title  reach overhead with right arm with pain decreased >/= 75% due to increased right shoulder strength >/= 4/5    Time  8    Period  Weeks    Status  New    Target Date  05/13/17      PT LONG TERM GOAL #4   Title  lay on right arm with pain decreased >/= 75% due to decreased inflammation     Time  8    Period  Weeks    Status  New    Target Date  05/13/17      PT LONG TERM GOAL #5   Title  reach behind her back for an item due to right shoulder rotation improved by 10-15 degrees    Time  8    Period  Days    Status  New    Target  Date  05/13/17            Plan - 04/17/17 1058    Clinical Impression Statement  Patient was able to increase her weight with arm exercises.  Patient is able to relaxing her upper trapezius with greater ease. Patient is having less daily pain in right shoulder in the past several days. Patient will benefit from skilled PT to improve strength and reduce pain.     Rehab Potential  Excellent    Clinical Impairments  Affecting Rehab Potential  Breast cancer with no radiation;  ovarian cancer with radiation osteoporosis; masectomy; macular degeneration    PT Frequency  2x / week    PT Duration  8 weeks    PT Treatment/Interventions  Iontophoresis '4mg'$ /ml Dexamethasone;Therapeutic activities;Therapeutic exercise;Electrical Stimulation;Neuromuscular re-education;Patient/family education;Manual techniques;Passive range of motion;Dry needling    PT Next Visit Plan  Shoulder strength and stabs, see if estim was helpful for pain    PT Home Exercise Plan  progress as needed    Recommended Other Services  renewal signed by MD    Consulted and Agree with Plan of Care  Patient       Patient will benefit from skilled therapeutic intervention in order to improve the following deficits and impairments:  Pain, Increased fascial restricitons, Increased muscle spasms, Decreased strength, Decreased range of motion, Decreased activity tolerance  Visit Diagnosis: Muscle weakness (generalized)  Stiffness of right shoulder, not elsewhere classified  Acute pain of right shoulder     Problem List Patient Active Problem List   Diagnosis Date Noted  . Genetic testing 05/23/2014  . History of breast cancer   . Family history of breast cancer   . Endometrial cancer ([place]) 03/21/2014  . Uterine cancer ([place]) 03/21/2014  . S/P mastectomy, bilateral 05/02/2013  . Situational stress 05/02/2013  . Osteoporosis, unspecified 05/02/2013  . Breast cancer, left breast ([place]) 05/02/2013    Earlie Counts, PT 04/17/17 11:07 AM   [facility] [address] [place], [place], [address] Phone: [phone]   Fax:  [phone]  Name: Beth Simon MRN: [id number] Date of Birth: [birth year]

## 2017-04-20 ENCOUNTER — Encounter: Payer: Self-pay | Admitting: Physical Therapy

## 2017-04-20 ENCOUNTER — Ambulatory Visit: Payer: PPO | Admitting: Physical Therapy

## 2017-04-20 DIAGNOSIS — M6281 Muscle weakness (generalized): Secondary | ICD-10-CM

## 2017-04-20 DIAGNOSIS — M25611 Stiffness of right shoulder, not elsewhere classified: Secondary | ICD-10-CM

## 2017-04-20 DIAGNOSIS — M25551 Pain in right hip: Secondary | ICD-10-CM

## 2017-04-20 DIAGNOSIS — M25511 Pain in right shoulder: Secondary | ICD-10-CM

## 2017-04-20 NOTE — Therapy (Signed)
[facility] [address] [place], [place], [address] Phone: [phone]   Fax:  [phone]  Physical Therapy Treatment  Patient Details  Name: Beth Simon MRN: [id number] Date of Birth: 11/01/1940 Referring Provider: Dr. Wandra Feinstein   Encounter Date: 04/20/2017  PT End of Session - 04/20/17 1233    Visit Number  10    Date for PT Re-Evaluation  05/13/17    Authorization Type  Medicare g-code on 10th visit    PT Start Time  1230    PT Stop Time  1312    PT Time Calculation (min)  42 min    Activity Tolerance  Patient tolerated treatment well    Behavior During Therapy  [facility] for tasks assessed/performed       Past Medical History:  Diagnosis Date  . Adenomatous polyps 01/2016  . Anemia   . Breast cancer ([place]) 1990   bilateral mastectomy, adenoca breast-left MRM, reconstruction, chemo  . Bronchitis last 2 weeks   saw dr Felipa Eth 02-28-2014, he said no antibiotics needed, nonproductive cough  . Complication of anesthesia   . Cystitis    cytoxen, had once or twice  . Family history of breast cancer   . History of breast cancer   . History of radiation therapy 2/10, 2/12, 2/18, 2/24, 05/29/14   vaginal cuff/ 30 Gy/ 5 fx  . Macular degeneration   . Neck pain    taking physictal therapy last 2 weeks  . Osteoporosis    on Evista  . PONV (postoperative nausea and vomiting)   . Shoulder pain    taking physical therapy for last few weeks  . Uterine cancer ([place]) 03/21/2014   MLH1/PMS2 LOH  . Uterine fibroid   . VAIN I (vaginal intraepithelial neoplasia grade I) 2018   positive HR HPV.  . Vasovagal syncopes     Past Surgical History:  Procedure Laterality Date  . BREAST IMPLANT REMOVAL  6/94   left breast  . BREAST RECONSTRUCTION  1990   left  . CATARACT EXTRACTION Bilateral   . EXCISION VAGINAL CYST    . MASTECTOMY Right 10/08/06   prophylactic  . RADICAL MASTECTOMY LND  1990   left, chemo done   . ROBOTIC ASSISTED TOTAL HYSTERECTOMY WITH BILATERAL SALPINGO OOPHERECTOMY Bilateral 03/21/2014   Procedure: ROBOTIC ASSISTED TOTAL HYSTERECTOMY WITH BILATERAL SALPINGO OOPHORECTOMY ;  Surgeon: Janie Morning, MD;  Location: [facility];  Service: Gynecology;  Laterality: Bilateral;    There were no vitals filed for this visit.  Subjective Assessment - 04/20/17 1236    Subjective  Friday and Saturday were good days. Yesterday and this AM soreness reaching behind and across me.     Pertinent History  Breast cancer; osteoporosis    Currently in Pain?  Yes    Pain Score  3     Pain Location  Shoulder    Pain Orientation  Right    Pain Descriptors / Indicators  Dull;Aching    Multiple Pain Sites  No                      [facility] Adult PT Treatment/Exercise - 04/20/17 0001      Shoulder Exercises: Seated   Other Seated Exercises  Flexion, scaption 2# 10x2 therapist gives tactle cues to upper trap to keep relaxed Pt preferred to stand      Shoulder Exercises: Standing   External Rotation  Strengthening;Both;20 reps;Theraband    Theraband Level (Shoulder  External Rotation)  Level 3 (Green)    Row  Strengthening;Both;20 reps;Theraband tactile cues for raising chest and squeeze shoulder blades    Theraband Level (Shoulder Row)  Level 2 (Red)    Other Standing Exercises  2# to first shelf 2 x10    Other Standing Exercises  Pendulums various directions  Wall push ups 10x2, VC for technique      Shoulder Exercises: Pulleys   Flexion  3 minutes    ABduction  3 minutes    Other Pulley Exercises  Behind the back 3 min      Electrical Stimulation   Electrical Stimulation Location  RT shoulder    Electrical Stimulation Action  IFC    Electrical Stimulation Parameters  80-150 HZ seated x 10 min    Electrical Stimulation Goals  Pain               PT Short Term Goals - 04/06/17 1357      PT SHORT TERM GOAL #2   Title  right shoulder pain decreased >/= 25% with sleeping on  it    Time  4    Period  Weeks    Status  Achieved      PT SHORT TERM GOAL #3   Title  right shoulder pain decreased >/= 25% with reaching overhead using right arm    Time  4    Period  Weeks    Status  Achieved        PT Long Term Goals - 03/18/17 1107      PT LONG TERM GOAL #1   Title  be independent in advanced HEP    Time  4    Period  Weeks    Status  New    Target Date  05/13/17      PT LONG TERM GOAL #2   Title  reduce FOTO to < 36% to demo improved mobility and decreased pain    Time  8    Period  Weeks    Status  New    Target Date  05/13/17      PT LONG TERM GOAL #3   Title  reach overhead with right arm with pain decreased >/= 75% due to increased right shoulder strength >/= 4/5    Time  8    Period  Weeks    Status  New    Target Date  05/13/17      PT LONG TERM GOAL #4   Title  lay on right arm with pain decreased >/= 75% due to decreased inflammation     Time  8    Period  Weeks    Status  New    Target Date  05/13/17      PT LONG TERM GOAL #5   Title  reach behind her back for an item due to right shoulder rotation improved by 10-15 degrees    Time  8    Period  Days    Status  New    Target Date  05/13/17            Plan - 04/20/17 1234    Clinical Impression Statement  Kept loads the same for todays session as they are currently challenging to pt. We chose to double the amount of work by increasing to 2 sets so she will have more carry over into her ADLS. Was able to increase the resistance for her rows to green band today.     Rehab  Potential  Excellent    Clinical Impairments Affecting Rehab Potential  Breast cancer with no radiation;  ovarian cancer with radiation osteoporosis; masectomy; macular degeneration    PT Frequency  2x / week    PT Duration  8 weeks    PT Treatment/Interventions  Iontophoresis 9m/ml Dexamethasone;Therapeutic activities;Therapeutic exercise;Electrical Stimulation;Neuromuscular re-education;Patient/family  education;Manual techniques;Passive range of motion;Dry needling    Consulted and Agree with Plan of Care  Patient       Patient will benefit from skilled therapeutic intervention in order to improve the following deficits and impairments:  Pain, Increased fascial restricitons, Increased muscle spasms, Decreased strength, Decreased range of motion, Decreased activity tolerance  Visit Diagnosis: Muscle weakness (generalized)  Stiffness of right shoulder, not elsewhere classified  Acute pain of right shoulder  Pain in right hip     Problem List Patient Active Problem List   Diagnosis Date Noted  . Genetic testing 05/23/2014  . History of breast cancer   . Family history of breast cancer   . [place] ([place] 03/21/2014  . [place]terine cancer ([place] 03/21/2014  . S/P mastectomy, bilateral 05/02/2013  . Situational stress 05/02/2013  . Osteoporosis, unspecified 05/02/2013  . Breast cancer, left breast ([place] 05/02/2013    ,, PTA 04/20/2017, 1:03 PM  [facility] [address] [place] [address]: [phone]  Fax:  [phone] Name: Beth INNESMRN: [id number] of Birth: [birth year]

## 2017-04-22 ENCOUNTER — Ambulatory Visit: Payer: PPO | Admitting: Physical Therapy

## 2017-04-22 ENCOUNTER — Encounter: Payer: Self-pay | Admitting: Physical Therapy

## 2017-04-22 DIAGNOSIS — M6281 Muscle weakness (generalized): Secondary | ICD-10-CM

## 2017-04-22 DIAGNOSIS — M25611 Stiffness of right shoulder, not elsewhere classified: Secondary | ICD-10-CM

## 2017-04-22 DIAGNOSIS — M25511 Pain in right shoulder: Secondary | ICD-10-CM

## 2017-04-22 NOTE — Therapy (Signed)
[facility] [address], [place], [place], [address] Phone: [phone]   Fax:  [phone]  Physical Therapy Treatment  Patient Details  Name: Beth Simon MRN: [id number] Date of Birth: 03-Jul-1940 Referring Provider: Dr. Wandra Feinstein   Encounter Date: 04/22/2017  PT End of Session - 04/22/17 1027    Visit Number  11    Date for PT Re-Evaluation  05/13/17    Authorization Type  Medicare g-code on 10th visit    PT Start Time  1015    PT Stop Time  1115    PT Time Calculation (min)  60 min    Activity Tolerance  Patient tolerated treatment well    Behavior During Therapy  [facility] for tasks assessed/performed       Past Medical History:  Diagnosis Date  . Adenomatous polyps 01/2016  . Anemia   . Breast cancer ([place]) 1990   bilateral mastectomy, adenoca breast-left MRM, reconstruction, chemo  . Bronchitis last 2 weeks   saw dr Felipa Eth 02-28-2014, he said no antibiotics needed, nonproductive cough  . Complication of anesthesia   . Cystitis    cytoxen, had once or twice  . Family history of breast cancer   . History of breast cancer   . History of radiation therapy 2/10, 2/12, 2/18, 2/24, 05/29/14   vaginal cuff/ 30 Gy/ 5 fx  . Macular degeneration   . Neck pain    taking physictal therapy last 2 weeks  . Osteoporosis    on Evista  . PONV (postoperative nausea and vomiting)   . Shoulder pain    taking physical therapy for last few weeks  . Uterine cancer ([place]) 03/21/2014   MLH1/PMS2 LOH  . Uterine fibroid   . VAIN I (vaginal intraepithelial neoplasia grade I) 2018   positive HR HPV.  . Vasovagal syncopes     Past Surgical History:  Procedure Laterality Date  . BREAST IMPLANT REMOVAL  6/94   left breast  . BREAST RECONSTRUCTION  1990   left  . CATARACT EXTRACTION Bilateral   . EXCISION VAGINAL CYST    . MASTECTOMY Right 10/08/06   prophylactic  . RADICAL MASTECTOMY LND  1990   left, chemo done   . ROBOTIC ASSISTED TOTAL HYSTERECTOMY WITH BILATERAL SALPINGO OOPHERECTOMY Bilateral 03/21/2014   Procedure: ROBOTIC ASSISTED TOTAL HYSTERECTOMY WITH BILATERAL SALPINGO OOPHORECTOMY ;  Surgeon: Janie Morning, MD;  Location: [facility];  Service: Gynecology;  Laterality: Bilateral;    There were no vitals filed for this visit.  Subjective Assessment - 04/22/17 1023    Subjective  Yesterday my shoulder has felt improved. The e-stim helps my pain after therapy.     Pertinent History  Breast cancer; osteoporosis    Patient Stated Goals  reduce pain; improve mobility     Currently in Pain?  Yes    Pain Score  2     Pain Location  Shoulder    Pain Orientation  Right    Pain Descriptors / Indicators  Aching;Dull    Pain Type  Acute pain    Pain Onset  More than a month ago    Pain Frequency  Intermittent    Aggravating Factors   reaching overhead    Pain Relieving Factors  moving arm to not get stiff, work with stretch band    Multiple Pain Sites  No         [facility] PT Assessment - 04/22/17 0001      AROM  Right Shoulder Flexion  168 Degrees    Right Shoulder ABduction  161 Degrees    Right Shoulder Internal Rotation  45 Degrees    Right Shoulder External Rotation  70 Degrees                  [facility] Adult PT Treatment/Exercise - 04/22/17 0001      Shoulder Exercises: Standing   Horizontal ABduction  Strengthening;Both;10 reps;Theraband    Theraband Level (Shoulder Horizontal ABduction)  Level 1 (Yellow)    External Rotation  Strengthening;Both;20 reps;Theraband    Theraband Level (Shoulder External Rotation)  Level 3 (Green)    Row  Strengthening;Both;20 reps;Theraband tactile cues for raising chest and squeeze shoulder blades    Theraband Level (Shoulder Row)  Level 3 (Green)    Other Standing Exercises  2# to first shelf 2 x10; 5x 2nd shelf      Shoulder Exercises: Pulleys   Flexion  3 minutes while assessing patient progress    ABduction  3 minutes    Other Pulley  Exercises  Behind the back 3 min      Electrical Stimulation   Electrical Stimulation Location  RT shoulder    Electrical Stimulation Action  IFC    Electrical Stimulation Parameters  to patient tolerance; 15 min, seated    Electrical Stimulation Goals  Pain      Manual Therapy   Manual Therapy  Soft tissue mobilization    Soft tissue mobilization  right pectoralis by rib insertion and humeral while on stretch               PT Short Term Goals - 04/06/17 1357      PT SHORT TERM GOAL #2   Title  right shoulder pain decreased >/= 25% with sleeping on it    Time  4    Period  Weeks    Status  Achieved      PT SHORT TERM GOAL #3   Title  right shoulder pain decreased >/= 25% with reaching overhead using right arm    Time  4    Period  Weeks    Status  Achieved        PT Long Term Goals - 04/22/17 1056      PT LONG TERM GOAL #1   Title  be independent in advanced HEP    Time  4    Period  Weeks    Status  On-going      PT LONG TERM GOAL #2   Title  reduce FOTO to < 36% to demo improved mobility and decreased pain    Time  8    Period  Weeks    Status  On-going      PT LONG TERM GOAL #3   Title  reach overhead with right arm with pain decreased >/= 75% due to increased right shoulder strength >/= 4/5    Time  8    Period  Weeks    Status  On-going      PT LONG TERM GOAL #4   Title  lay on right arm with pain decreased >/= 75% due to decreased inflammation     Time  8    Period  Weeks    Status  On-going      PT LONG TERM GOAL #5   Title  reach behind her back for an item due to right shoulder rotation improved by 10-15 degrees    Time  8  Period  Days    Status  On-going            Plan - 04/22/17 1054    Clinical Impression Statement  Patient has increased right shoulder AROM.  Patient is able to keep her right upper trap relaxed with overhead pulley exercise.  Patient has increased tightness in right pectoralis insertion and origin.   Patient will benefit from skilled therapy to improve right shoulder strength for daily activities with reduction of pain.     Rehab Potential  Excellent    Clinical Impairments Affecting Rehab Potential  Breast cancer with no radiation;  ovarian cancer with radiation osteoporosis; masectomy; macular degeneration    PT Frequency  2x / week    PT Treatment/Interventions  Iontophoresis 32m/ml Dexamethasone;Therapeutic activities;Therapeutic exercise;Electrical Stimulation;Neuromuscular re-education;Patient/family education;Manual techniques;Passive range of motion;Dry needling    PT Next Visit Plan  Shoulder strength and stabs, work in midrange strength    PT Home Exercise Plan  progress as needed    Consulted and Agree with Plan of Care  Patient       Patient will benefit from skilled therapeutic intervention in order to improve the following deficits and impairments:  Pain, Increased fascial restricitons, Increased muscle spasms, Decreased strength, Decreased range of motion, Decreased activity tolerance  Visit Diagnosis: Muscle weakness (generalized)  Stiffness of right shoulder, not elsewhere classified  Acute pain of right shoulder     Problem List Patient Active Problem List   Diagnosis Date Noted  . Genetic testing 05/23/2014  . History of breast cancer   . Family history of breast cancer   . [place]ometrial cancer ([place] 03/21/2014  . Uterine cancer ([place] 03/21/2014  . S/P mastectomy, bilateral 05/02/2013  . Situational stress 05/02/2013  . Osteoporosis, unspecified 05/02/2013  . Breast cancer, left breast ([place] 05/02/2013    CEarlie Counts PT 04/22/17 10:58 AM    [facility] [address] [place] [address]Phone: [phone]  Fax:  [phone] Name: Beth ORLOWSKIMRN: [id number] of Birth: [birth year]

## 2017-04-27 ENCOUNTER — Encounter: Payer: Self-pay | Admitting: Physical Therapy

## 2017-04-27 ENCOUNTER — Ambulatory Visit: Payer: PPO | Admitting: Physical Therapy

## 2017-04-27 DIAGNOSIS — M6281 Muscle weakness (generalized): Secondary | ICD-10-CM | POA: Diagnosis not present

## 2017-04-27 DIAGNOSIS — M25611 Stiffness of right shoulder, not elsewhere classified: Secondary | ICD-10-CM

## 2017-04-27 DIAGNOSIS — M25511 Pain in right shoulder: Secondary | ICD-10-CM

## 2017-04-27 NOTE — Therapy (Signed)
[facility] [address], [place], [place], [address] Phone: [phone]   Fax:  [phone]  Physical Therapy Treatment  Patient Details  Name: Beth Simon MRN: [id number] Date of Birth: 19-Mar-1941 Referring Provider: Dr. Wandra Feinstein   Encounter Date: 04/27/2017  PT End of Session - 04/27/17 1021    Visit Number  12    Date for PT Re-Evaluation  05/13/17    Authorization Type  Medicare g-code on 10th visit    PT Start Time  1020    PT Stop Time  1110    PT Time Calculation (min)  50 min    Activity Tolerance  Patient tolerated treatment well    Behavior During Therapy  [facility] for tasks assessed/performed       Past Medical History:  Diagnosis Date  . Adenomatous polyps 01/2016  . Anemia   . Breast cancer ([place]) 1990   bilateral mastectomy, adenoca breast-left MRM, reconstruction, chemo  . Bronchitis last 2 weeks   saw dr Felipa Eth 02-28-2014, he said no antibiotics needed, nonproductive cough  . Complication of anesthesia   . Cystitis    cytoxen, had once or twice  . Family history of breast cancer   . History of breast cancer   . History of radiation therapy 2/10, 2/12, 2/18, 2/24, 05/29/14   vaginal cuff/ 30 Gy/ 5 fx  . Macular degeneration   . Neck pain    taking physictal therapy last 2 weeks  . Osteoporosis    on Evista  . PONV (postoperative nausea and vomiting)   . Shoulder pain    taking physical therapy for last few weeks  . Uterine cancer ([place]) 03/21/2014   MLH1/PMS2 LOH  . Uterine fibroid   . VAIN I (vaginal intraepithelial neoplasia grade I) 2018   positive HR HPV.  . Vasovagal syncopes     Past Surgical History:  Procedure Laterality Date  . BREAST IMPLANT REMOVAL  6/94   left breast  . BREAST RECONSTRUCTION  1990   left  . CATARACT EXTRACTION Bilateral   . EXCISION VAGINAL CYST    . MASTECTOMY Right 10/08/06   prophylactic  . RADICAL MASTECTOMY LND  1990   left, chemo done   . ROBOTIC ASSISTED TOTAL HYSTERECTOMY WITH BILATERAL SALPINGO OOPHERECTOMY Bilateral 03/21/2014   Procedure: ROBOTIC ASSISTED TOTAL HYSTERECTOMY WITH BILATERAL SALPINGO OOPHORECTOMY ;  Surgeon: Janie Morning, MD;  Location: [facility];  Service: Gynecology;  Laterality: Bilateral;    There were no vitals filed for this visit.  Subjective Assessment - 04/27/17 1023    Subjective  pain has not gone away but is improving.  I have most pain when I wake up. Pain is 50% better.     Pertinent History  Breast cancer; osteoporosis    Patient Stated Goals  reduce pain; improve mobility     Currently in Pain?  Yes    Pain Score  2     Pain Location  Shoulder    Pain Orientation  Right    Pain Descriptors / Indicators  Aching;Dull    Pain Type  Acute pain    Pain Onset  More than a month ago    Pain Frequency  Intermittent    Aggravating Factors   reaching overhead and wake up in the morning    Pain Relieving Factors  moving arm to not get stiff, work with stretch band    Multiple Pain Sites  No  [facility] PT Assessment - 04/27/17 0001      AROM   Right Shoulder Internal Rotation  -- reach to T5                  [facility] Adult PT Treatment/Exercise - 04/27/17 0001      Shoulder Exercises: Seated   Flexion  Strengthening;Both;10 reps;Weights midrange    Flexion Weight (lbs)  1    Abduction  Weights midrange scaption 10x, abduction 10x ;1#    ABduction Weight (lbs)  1#      Shoulder Exercises: Standing   External Rotation  Strengthening;Both;20 reps;Theraband    Theraband Level (Shoulder External Rotation)  Level 2 (Red) around bil. wrist    Row  Strengthening;Both;20 reps;Theraband tactile cues for raising chest and squeeze shoulder blades    Theraband Level (Shoulder Row)  Level 3 (Green)    Other Standing Exercises  2# to first shelf 2 x10; 5x 2nd shelf    Other Standing Exercises  wall push ups 10x;       Shoulder Exercises: Pulleys   Flexion  3 minutes while assessing  patient progress    ABduction  3 minutes    Other Pulley Exercises  Behind the back 3 min      Shoulder Exercises: Stretch   Corner Stretch  4 reps;30 seconds while therapist stretches the right pectoralis      Modalities   Modalities  Electrical engineer Stimulation Location  RT shoulder    Electrical Stimulation Action  IFC    Electrical Stimulation Parameters  To patient toleranne, 15 min    Electrical Stimulation Goals  Pain               PT Short Term Goals - 04/06/17 1357      PT SHORT TERM GOAL #2   Title  right shoulder pain decreased >/= 25% with sleeping on it    Time  4    Period  Weeks    Status  Achieved      PT SHORT TERM GOAL #3   Title  right shoulder pain decreased >/= 25% with reaching overhead using right arm    Time  4    Period  Weeks    Status  Achieved        PT Long Term Goals - 04/27/17 1056      PT LONG TERM GOAL #1   Title  be independent in advanced HEP    Baseline  still learning    Time  4    Period  Weeks    Status  On-going      PT LONG TERM GOAL #3   Title  reach overhead with right arm with pain decreased >/= 75% due to increased right shoulder strength >/= 4/5    Baseline  50% better    Time  8    Period  Weeks    Status  On-going      PT LONG TERM GOAL #4   Title  lay on right arm with pain decreased >/= 75% due to decreased inflammation     Baseline  50% better    Time  8    Period  Weeks    Status  On-going      PT LONG TERM GOAL #5   Title  reach behind her back for an item due to right shoulder rotation improved by 10-15 degrees    Baseline  reach to T5    Time  8    Period  Days    Status  On-going            Plan - 04/27/17 1037    Clinical Impression Statement  It is difficult for patient to strengthen in midrange.  Patient has improved tissue mobility of right pectoralis.  Patient was able to do wall pushups with greater ease. Patient is able to  reach to T5 with right arm.  Patient will benefit from skilled hterapy ot improve right shoulder strength for daily activities with reduction of pain.     Rehab Potential  Excellent    Clinical Impairments Affecting Rehab Potential  Breast cancer with no radiation;  ovarian cancer with radiation osteoporosis; masectomy; macular degeneration    PT Frequency  2x / week    PT Duration  8 weeks    PT Treatment/Interventions  Iontophoresis 28m/ml Dexamethasone;Therapeutic activities;Therapeutic exercise;Electrical Stimulation;Neuromuscular re-education;Patient/family education;Manual techniques;Passive range of motion;Dry needling    PT Next Visit Plan  Shoulder strength and stabs, work in midrange strength    PT Home Exercise Plan  progress as needed    Consulted and Agree with Plan of Care  Patient       Patient will benefit from skilled therapeutic intervention in order to improve the following deficits and impairments:  Pain, Increased fascial restricitons, Increased muscle spasms, Decreased strength, Decreased range of motion, Decreased activity tolerance  Visit Diagnosis: Muscle weakness (generalized)  Stiffness of right shoulder, not elsewhere classified  Acute pain of right shoulder     Problem List Patient Active Problem List   Diagnosis Date Noted  . Genetic testing 05/23/2014  . History of breast cancer   . Family history of breast cancer   . [place] 03/21[place]  . [place]terine cancer [place] 03/21/2014  . S/P mastectomy, bilateral 05/02/2013  . Situational stress 05/02/2013  . Osteoporosis, unspecified 05/02/2013  . Breast cancer, left breast ([place] 05/02/2013   CEarlie Counts PT 04/27/17 10:58 AM   [facility] [address] [place] [address]: [phone]  Fax:  [phone] Name: Beth HALBERGMRN: [id number] of Birth: 4April 21, 1942

## 2017-04-29 ENCOUNTER — Encounter: Payer: Self-pay | Admitting: Physical Therapy

## 2017-04-29 ENCOUNTER — Ambulatory Visit: Payer: PPO | Admitting: Physical Therapy

## 2017-04-29 DIAGNOSIS — M6281 Muscle weakness (generalized): Secondary | ICD-10-CM | POA: Diagnosis not present

## 2017-04-29 DIAGNOSIS — M25611 Stiffness of right shoulder, not elsewhere classified: Secondary | ICD-10-CM

## 2017-04-29 DIAGNOSIS — M25511 Pain in right shoulder: Secondary | ICD-10-CM

## 2017-04-29 NOTE — Therapy (Signed)
[facility] [address], [place], [place], [address] Phone: [phone]   Fax:  [phone]  Physical Therapy Treatment  Patient Details  Name: Beth Simon MRN: [id number] Date of Birth: [birth year] Referring Provider: Dr. Wandra Feinstein   Encounter Date: 04/29/2017  PT End of Session - 04/29/17 1045    Visit Number  13    Date for PT Re-Evaluation  05/13/17    Authorization Type  Medicare KX on 18 th visit    PT Start Time  1015    PT Stop Time  1105    PT Time Calculation (min)  50 min    Activity Tolerance  Patient tolerated treatment well    Behavior During Therapy  [facility] for tasks assessed/performed       Past Medical History:  Diagnosis Date  . Adenomatous polyps 01/2016  . Anemia   . Breast cancer ([place]) 1990   bilateral mastectomy, adenoca breast-left MRM, reconstruction, chemo  . Bronchitis last 2 weeks   saw dr Felipa Eth 02-28-2014, he said no antibiotics needed, nonproductive cough  . Complication of anesthesia   . Cystitis    cytoxen, had once or twice  . Family history of breast cancer   . History of breast cancer   . History of radiation therapy 2/10, 2/12, 2/18, 2/24, 05/29/14   vaginal cuff/ 30 Gy/ 5 fx  . Macular degeneration   . Neck pain    taking physictal therapy last 2 weeks  . Osteoporosis    on Evista  . PONV (postoperative nausea and vomiting)   . Shoulder pain    taking physical therapy for last few weeks  . Uterine cancer ([place]) 03/21/2014   MLH1/PMS2 LOH  . Uterine fibroid   . VAIN I (vaginal intraepithelial neoplasia grade I) 2018   positive HR HPV.  . Vasovagal syncopes     Past Surgical History:  Procedure Laterality Date  . BREAST IMPLANT REMOVAL  6/94   left breast  . BREAST RECONSTRUCTION  1990   left  . CATARACT EXTRACTION Bilateral   . EXCISION VAGINAL CYST    . MASTECTOMY Right 10/08/06   prophylactic  . RADICAL MASTECTOMY LND  1990   left, chemo done   . ROBOTIC ASSISTED TOTAL HYSTERECTOMY WITH BILATERAL SALPINGO OOPHERECTOMY Bilateral 03/21/2014   Procedure: ROBOTIC ASSISTED TOTAL HYSTERECTOMY WITH BILATERAL SALPINGO OOPHORECTOMY ;  Surgeon: Janie Morning, MD;  Location: [facility];  Service: Gynecology;  Laterality: Bilateral;    There were no vitals filed for this visit.  Subjective Assessment - 04/29/17 1020    Subjective  after therapy last time I had increased pain and had to take a Tylenol.  I feel better today.     Pertinent History  Breast cancer; osteoporosis    Patient Stated Goals  reduce pain; improve mobility     Currently in Pain?  Yes    Pain Score  1     Pain Location  Shoulder    Pain Orientation  Right    Pain Descriptors / Indicators  Aching;Dull    Pain Type  Acute pain    Pain Onset  More than a month ago    Pain Frequency  Intermittent    Aggravating Factors   reaching overhead and wake up in the morning    Pain Relieving Factors  Moving arm to not get stiff, work with stretch band    Multiple Pain Sites  No  [place] Adult PT Treatment/Exercise - 04/29/17 0001      Therapeutic Activites    Therapeutic Activities  ADL's    ADL's  using pillows to take the pressure off her shoulder in sidely      Shoulder Exercises: Standing   External Rotation  Strengthening;Both;20 reps;Theraband    Theraband Level (Shoulder External Rotation)  Level 2 (Red) around bil. wrist    Row  Strengthening;Both;20 reps;Theraband;10 reps tactile cues for raising chest and squeeze shoulder blades    Theraband Level (Shoulder Row)  Level 4 (Blue)    Other Standing Exercises  2# to first shelf 2 x10; 5x 2nd shelf    Other Standing Exercises  wall push ups 10x;       Shoulder Exercises: Pulleys   Flexion  3 minutes while assessing patient progress    ABduction  3 minutes    Other Pulley Exercises  Behind the back 3 min      Shoulder Exercises: Stretch   Corner Stretch  4 reps;30 seconds while  therapist stretches the right pectoralis      Modalities   Modalities  Electrical engineer Stimulation Location  RT shoulder    Electrical Stimulation Action  IFC    Electrical Stimulation Parameters  to patient tolerance, 15 min    Electrical Stimulation Goals  Pain               PT Short Term Goals - 04/06/17 1357      PT SHORT TERM GOAL #2   Title  right shoulder pain decreased >/= 25% with sleeping on it    Time  4    Period  Weeks    Status  Achieved      PT SHORT TERM GOAL #3   Title  right shoulder pain decreased >/= 25% with reaching overhead using right arm    Time  4    Period  Weeks    Status  Achieved        PT Long Term Goals - 04/27/17 1056      PT LONG TERM GOAL #1   Title  be independent in advanced HEP    Baseline  still learning    Time  4    Period  Weeks    Status  On-going      PT LONG TERM GOAL #3   Title  reach overhead with right arm with pain decreased >/= 75% due to increased right shoulder strength >/= 4/5    Baseline  50% better    Time  8    Period  Weeks    Status  On-going      PT LONG TERM GOAL #4   Title  lay on right arm with pain decreased >/= 75% due to decreased inflammation     Baseline  50% better    Time  8    Period  Weeks    Status  On-going      PT LONG TERM GOAL #5   Title  reach behind her back for an item due to right shoulder rotation improved by 10-15 degrees    Baseline  reach to T5    Time  8    Period  Days    Status  On-going            Plan - 04/29/17 1047    Clinical Impression Statement  Patient able to lift 2# weight overhead without hiking  up her shoulder.  Patient has increased strength with shoulder rows due to increasing strength of theraband.  Patient still has pinpoint pain in the right A-C joint and suggested for her to call the doctor.  Patient will benefit from skilled therapy to improve right shoulder strength for daily activities  with reduction of pain.     Rehab Potential  Excellent    Clinical Impairments Affecting Rehab Potential  Breast cancer with no radiation;  ovarian cancer with radiation osteoporosis; masectomy; macular degeneration    PT Frequency  2x / week    PT Duration  8 weeks    PT Treatment/Interventions  Iontophoresis '4mg'$ /ml Dexamethasone;Therapeutic activities;Therapeutic exercise;Electrical Stimulation;Neuromuscular re-education;Patient/family education;Manual techniques;Passive range of motion;Dry needling    PT Next Visit Plan  Shoulder strength and stabs, work in midrange strength    PT Home Exercise Plan  progress as needed    Consulted and Agree with Plan of Care  Patient       Patient will benefit from skilled therapeutic intervention in order to improve the following deficits and impairments:  Pain, Increased fascial restricitons, Increased muscle spasms, Decreased strength, Decreased range of motion, Decreased activity tolerance  Visit Diagnosis: Muscle weakness (generalized)  Stiffness of right shoulder, not elsewhere classified  Acute pain of right shoulder     Problem List Patient Active Problem List   Diagnosis Date Noted  . Genetic testing 05/23/2014  . History of breast cancer   . Family history of breast cancer   . Endometrial cancer ([place]) 03/21/2014  . Uterine cancer ([place]) 03/21/2014  . S/P mastectomy, bilateral 05/02/2013  . Situational stress 05/02/2013  . Osteoporosis, unspecified 05/02/2013  . Breast cancer, left breast ([place]) 05/02/2013    Earlie Counts, PT 04/29/17 10:53 AM   [facility] [address], [place], [place], [address] Phone: [phone]   Fax:  [phone]  Name: Beth Simon MRN: [id number] Date of Birth: 12/19/1940

## 2017-05-04 ENCOUNTER — Telehealth: Payer: Self-pay | Admitting: Obstetrics and Gynecology

## 2017-05-04 ENCOUNTER — Encounter: Payer: Self-pay | Admitting: Physical Therapy

## 2017-05-04 ENCOUNTER — Ambulatory Visit: Payer: PPO | Admitting: Obstetrics and Gynecology

## 2017-05-04 DIAGNOSIS — J111 Influenza due to unidentified influenza virus with other respiratory manifestations: Secondary | ICD-10-CM | POA: Diagnosis not present

## 2017-05-04 DIAGNOSIS — I10 Essential (primary) hypertension: Secondary | ICD-10-CM | POA: Diagnosis not present

## 2017-05-04 NOTE — Telephone Encounter (Signed)
Please place patient in 08 recall and then close the encounter.  Cc- Thora Lance.

## 2017-05-04 NOTE — Telephone Encounter (Signed)
Patient already on 08 recall for 05-28-17.

## 2017-05-04 NOTE — Progress Notes (Unsigned)
76 y.o. G1P1001 Widowed Caucasian female here for annual exam.    PCP:     Patient's last menstrual period was 03/31/1988 (approximate).           Sexually active: {yes no:314532}  The current method of family planning is {contraception:315051}.    Exercising: {yes no:314532}  {types:19826} Smoker:  no  Health Maintenance: Pap:   05/16/16 Neg: positive HR HPV        04/16/15, Negative             04/29/12, Negative History of abnormal Pap:  {YES NO:22349} MMG:  *** Colonoscopy:  *** BMD:   ***  Result  *** TDaP:  *** Gardasil:   {YES NO:22349} HIV: Hep C: Screening Labs:  Hb today: ***, Urine today: ***   reports that  has never smoked. she has never used smokeless tobacco. She reports that she does not drink alcohol or use drugs.  Past Medical History:  Diagnosis Date  . Adenomatous polyps 01/2016  . Anemia   . Breast cancer ([facility]) 1990   bilateral mastectomy, adenoca breast-left MRM, reconstruction, chemo  . Bronchitis last 2 weeks   saw dr stoneking 02-28-2014, he said no antibiotics needed, nonproductive cough  . Complication of anesthesia   . Cystitis    cytoxen, had once or twice  . Family history of breast cancer   . History of breast cancer   . History of radiation therapy 2/10, 2/12, 2/18, 2/24, 05/29/14   vaginal cuff/ 30 Gy/ 5 fx  . Macular degeneration   . Neck pain    taking physictal therapy last 2 weeks  . Osteoporosis    on Evista  . PONV (postoperative nausea and vomiting)   . Shoulder pain    taking physical therapy for last few weeks  . Uterine cancer ([facility]) 03/21/2014   MLH1/PMS2 LOH  . Uterine fibroid   . VAIN I (vaginal intraepithelial neoplasia grade I) 2018   positive HR HPV.  . Vasovagal syncopes     Past Surgical History:  Procedure Laterality Date  . BREAST IMPLANT REMOVAL  6/94   left breast  . BREAST RECONSTRUCTION  1990   left  . CATARACT EXTRACTION Bilateral   . EXCISION VAGINAL CYST    . MASTECTOMY Right 10/08/06   prophylactic  . RADICAL MASTECTOMY LND  1990   left, chemo done  . ROBOTIC ASSISTED TOTAL HYSTERECTOMY WITH BILATERAL SALPINGO OOPHERECTOMY Bilateral 03/21/2014   Procedure: ROBOTIC ASSISTED TOTAL HYSTERECTOMY WITH BILATERAL SALPINGO OOPHORECTOMY ;  Surgeon: Wendy Brewster, MD;  Location: [facility];  Service: Gynecology;  Laterality: Bilateral;    Current Outpatient Medications  Medication Sig Dispense Refill  . Coenzyme Q10 (COQ10 PO) Take 1 tablet by mouth every morning.     . Cyanocobalamin (VITAMIN B 12 PO) Take 1 tablet by mouth every morning.     . denosumab (PROLIA) 60 MG/ML SOLN injection Inject 60 mg into the skin every 6 (six) months. Administer in upper arm, thigh, or abdomen    . Flaxseed, Linseed, (FLAX SEEDS PO) Take 1 scoop by mouth every morning. Ground flaxseed in cereal.    . MELATONIN PO Take 1 tablet by mouth at bedtime as needed (sleep.).     . Omega-3 Fatty Acids (FISH OIL PO) Take 1 capsule by mouth every morning.     . OVER THE COUNTER MEDICATION Take 1 tablet by mouth every morning. Astazanthin. (anti-oxidant)    . VITAMIN D, CHOLECALCIFEROL, PO Take 4,000 Int'l Units by mouth   every morning.      No current facility-administered medications for this visit.     Family History  Problem Relation Age of Onset  . Breast cancer Sister 56       recurrence age 60  . Breast cancer Paternal Grandmother   . Heart disease Mother   . Heart disease Father   . Hypertension Sister   . Dementia Sister   . Breast cancer Maternal Aunt 68  . Dementia Maternal Aunt        dx in her 52s    ROS:  Pertinent items are noted in HPI.  Otherwise, a comprehensive ROS was negative.  Exam:   LMP 03/31/1988 (Approximate)     General appearance: alert, cooperative and appears stated age Head: Normocephalic, without obvious abnormality, atraumatic Neck: no adenopathy, supple, symmetrical, trachea midline and thyroid normal to inspection and palpation Lungs: clear to auscultation  bilaterally Breasts: normal appearance, no masses or tenderness, No nipple retraction or dimpling, No nipple discharge or bleeding, No axillary or supraclavicular adenopathy Heart: regular rate and rhythm Abdomen: soft, non-tender; no masses, no organomegaly Extremities: extremities normal, atraumatic, no cyanosis or edema Skin: Skin color, texture, turgor normal. No rashes or lesions Lymph nodes: Cervical, supraclavicular, and axillary nodes normal. No abnormal inguinal nodes palpated Neurologic: Grossly normal  Pelvic: External genitalia:  no lesions              Urethra:  normal appearing urethra with no masses, tenderness or lesions              Bartholins and Skenes: normal                 Vagina: normal appearing vagina with normal color and discharge, no lesions              Cervix: no lesions              Pap taken: {yes no:314532} Bimanual Exam:  Uterus:  normal size, contour, position, consistency, mobility, non-tender              Adnexa: no mass, fullness, tenderness              Rectal exam: {yes no:314532}.  Confirms.              Anus:  normal sphincter tone, no lesions  Chaperone was present for exam.  Assessment:   Well woman visit with normal exam.   Plan: Mammogram screening discussed. Recommended self breast awareness. Pap and HR HPV as above. Guidelines for Calcium, Vitamin D, regular exercise program including cardiovascular and weight bearing exercise.   Follow up annually and prn.   Additional counseling given.  {yes [id number]. _______ minutes face to face time of which over 50% was spent in counseling.    After visit summary provided.

## 2017-05-04 NOTE — Telephone Encounter (Signed)
Patient canceled her aex today due to illness with fever. Patient rescheduled to 06/03/17 at 9:00 am.

## 2017-05-05 ENCOUNTER — Ambulatory Visit: Payer: PPO | Admitting: Podiatry

## 2017-05-06 ENCOUNTER — Encounter: Payer: Self-pay | Admitting: Physical Therapy

## 2017-05-11 ENCOUNTER — Encounter: Payer: Self-pay | Admitting: Physical Therapy

## 2017-05-11 ENCOUNTER — Ambulatory Visit: Payer: PPO | Attending: Sports Medicine | Admitting: Physical Therapy

## 2017-05-11 DIAGNOSIS — M25611 Stiffness of right shoulder, not elsewhere classified: Secondary | ICD-10-CM | POA: Diagnosis not present

## 2017-05-11 DIAGNOSIS — M6281 Muscle weakness (generalized): Secondary | ICD-10-CM

## 2017-05-11 DIAGNOSIS — M25511 Pain in right shoulder: Secondary | ICD-10-CM | POA: Diagnosis not present

## 2017-05-11 NOTE — Therapy (Signed)
[facility] [address] [place], [place], [address] Phone: [phone]   Fax:  [phone]  Physical Therapy Treatment  Patient Details  Name: Beth Simon MRN: [id number] Date of Birth: 1940/06/06 Referring Provider: Dr. Wandra Feinstein   Encounter Date: 05/11/2017  PT End of Session - 05/11/17 1235    Visit Number  14    Authorization Type  Medicare KX on 18 th visit    PT Start Time  1230    PT Stop Time  1316    PT Time Calculation (min)  46 min    Activity Tolerance  Patient tolerated treatment well    Behavior During Therapy  [facility] for tasks assessed/performed       Past Medical History:  Diagnosis Date  . Adenomatous polyps 01/2016  . Anemia   . Breast cancer ([place]) 1990   bilateral mastectomy, adenoca breast-left MRM, reconstruction, chemo  . Bronchitis last 2 weeks   saw dr Felipa Eth 02-28-2014, he said no antibiotics needed, nonproductive cough  . Complication of anesthesia   . Cystitis    cytoxen, had once or twice  . Family history of breast cancer   . History of breast cancer   . History of radiation therapy 2/10, 2/12, 2/18, 2/24, 05/29/14   vaginal cuff/ 30 Gy/ 5 fx  . Macular degeneration   . Neck pain    taking physictal therapy last 2 weeks  . Osteoporosis    on Evista  . PONV (postoperative nausea and vomiting)   . Shoulder pain    taking physical therapy for last few weeks  . Uterine cancer ([place]) 03/21/2014   MLH1/PMS2 LOH  . Uterine fibroid   . VAIN I (vaginal intraepithelial neoplasia grade I) 2018   positive HR HPV.  . Vasovagal syncopes     Past Surgical History:  Procedure Laterality Date  . BREAST IMPLANT REMOVAL  6/94   left breast  . BREAST RECONSTRUCTION  1990   left  . CATARACT EXTRACTION Bilateral   . EXCISION VAGINAL CYST    . MASTECTOMY Right 10/08/06   prophylactic  . RADICAL MASTECTOMY LND  1990   left, chemo done  . ROBOTIC ASSISTED TOTAL HYSTERECTOMY  WITH BILATERAL SALPINGO OOPHERECTOMY Bilateral 03/21/2014   Procedure: ROBOTIC ASSISTED TOTAL HYSTERECTOMY WITH BILATERAL SALPINGO OOPHORECTOMY ;  Surgeon: Janie Morning, MD;  Location: [facility];  Service: Gynecology;  Laterality: Bilateral;    There were no vitals filed for this visit.  Subjective Assessment - 05/11/17 1237    Subjective  Pt had flu last week.     Pertinent History  Breast cancer; osteoporosis    Currently in Pain?  Yes    Pain Score  1     Pain Location  Shoulder    Pain Orientation  Right    Pain Descriptors / Indicators  Sore    Aggravating Factors   Nothing consistently    Pain Relieving Factors  Some exercises, Estim    Multiple Pain Sites  No                      [facility] Adult PT Treatment/Exercise - 05/11/17 0001      Shoulder Exercises: Seated   Other Seated Exercises  3 way raise 1# 8x each standing      Shoulder Exercises: Standing   External Rotation  Strengthening;Both;20 reps;Theraband    Theraband Level (Shoulder External Rotation)  Level 2 (Red) VC for posture  Row  Strengthening;Both;20 reps;Theraband;10 reps tactile cues for raising chest and squeeze shoulder blades    Theraband Level (Shoulder Row)  Level 4 (Blue)    Shoulder Elevation  -- CKC ball rolls on wall 10x each; VC/TC for technique    Other Standing Exercises  2# 5x first shelf, 5x second shelf, then repeat    Other Standing Exercises  wall push ups 10x 2 x;  VC to hug her shoulder baldes      Shoulder Exercises: Pulleys   Flexion  3 minutes    ABduction  3 minutes    Other Pulley Exercises  Behind the back 3 min      Electrical Stimulation   Electrical Stimulation Location  RT shoulder    Electrical Stimulation Action  IFC    Electrical Stimulation Parameters  to tolerance 10 min    Electrical Stimulation Goals  Pain               PT Short Term Goals - 04/06/17 1357      PT SHORT TERM GOAL #2   Title  right shoulder pain decreased >/= 25% with  sleeping on it    Time  4    Period  Weeks    Status  Achieved      PT SHORT TERM GOAL #3   Title  right shoulder pain decreased >/= 25% with reaching overhead using right arm    Time  4    Period  Weeks    Status  Achieved        PT Long Term Goals - 04/27/17 1056      PT LONG TERM GOAL #1   Title  be independent in advanced HEP    Baseline  still learning    Time  4    Period  Weeks    Status  On-going      PT LONG TERM GOAL #3   Title  reach overhead with right arm with pain decreased >/= 75% due to increased right shoulder strength >/= 4/5    Baseline  50% better    Time  8    Period  Weeks    Status  On-going      PT LONG TERM GOAL #4   Title  lay on right arm with pain decreased >/= 75% due to decreased inflammation     Baseline  50% better    Time  8    Period  Weeks    Status  On-going      PT LONG TERM GOAL #5   Title  reach behind her back for an item due to right shoulder rotation improved by 10-15 degrees    Baseline  reach to T5    Time  8    Period  Days    Status  On-going            Plan - 05/11/17 1235    Clinical Impression Statement  Pt returns today after being away for about a week with the flu. She has some lingering chest congestion which she reports makes her feel less energetic. Since last report her shoulder feels about the same: some better overall but still would like to be even better. She is considering calling Dr Layne Benton to make an appt. She tolerated all her exercises well, some pain lifting long lever arm to 90 degrees in flexion and abduction.  She will have a reevaluation at her next sesison.     Rehab Potential  Excellent    Clinical Impairments Affecting Rehab Potential  Breast cancer with no radiation;  ovarian cancer with radiation osteoporosis; masectomy; macular degeneration    PT Frequency  2x / week    PT Duration  8 weeks    PT Treatment/Interventions  Iontophoresis '4mg'$ /ml Dexamethasone;Therapeutic  activities;Therapeutic exercise;Electrical Stimulation;Neuromuscular re-education;Patient/family education;Manual techniques;Passive range of motion;Dry needling    PT Next Visit Plan  ERO next session.     Consulted and Agree with Plan of Care  --       Patient will benefit from skilled therapeutic intervention in order to improve the following deficits and impairments:  Pain, Increased fascial restricitons, Increased muscle spasms, Decreased strength, Decreased range of motion, Decreased activity tolerance  Visit Diagnosis: Muscle weakness (generalized)  Stiffness of right shoulder, not elsewhere classified  Acute pain of right shoulder     Problem List Patient Active Problem List   Diagnosis Date Noted  . Genetic testing 05/23/2014  . History of breast cancer   . Family history of breast cancer   . Endometrial cancer ([place]) 03/21/2014  . Uterine cancer ([place]) 03/21/2014  . S/P mastectomy, bilateral 05/02/2013  . Situational stress 05/02/2013  . Osteoporosis, unspecified 05/02/2013  . Breast cancer, left breast ([place]) 05/02/2013    Lorianna Spadaccini, PTA 05/11/2017, 1:07 PM  [facility] [address] [place], [place], [address] Phone: [phone]   Fax:  [phone]  Name: Beth Simon MRN: [id number] Date of Birth: [birth year]

## 2017-05-13 ENCOUNTER — Ambulatory Visit: Payer: PPO | Admitting: Physical Therapy

## 2017-05-13 ENCOUNTER — Encounter: Payer: Self-pay | Admitting: Physical Therapy

## 2017-05-13 DIAGNOSIS — M25611 Stiffness of right shoulder, not elsewhere classified: Secondary | ICD-10-CM

## 2017-05-13 DIAGNOSIS — M25511 Pain in right shoulder: Secondary | ICD-10-CM

## 2017-05-13 DIAGNOSIS — M6281 Muscle weakness (generalized): Secondary | ICD-10-CM | POA: Diagnosis not present

## 2017-05-13 NOTE — Therapy (Signed)
[facility] [address] [place], [place], [address] Phone: [phone]   Fax:  [phone]  Physical Therapy Treatment  Patient Details  Name: Beth Simon MRN: [id number] Date of Birth: [birth year] Referring Provider: Dr. Wandra Feinstein   Encounter Date: 05/13/2017  PT End of Session - 05/13/17 0938    Visit Number  15    Date for PT Re-Evaluation  05/13/17    PT Start Time  0930    PT Stop Time  1010    PT Time Calculation (min)  40 min    Activity Tolerance  Patient tolerated treatment well    Behavior During Therapy  [facility] for tasks assessed/performed       Past Medical History:  Diagnosis Date  . Adenomatous polyps 01/2016  . Anemia   . Breast cancer ([place]) 1990   bilateral mastectomy, adenoca breast-left MRM, reconstruction, chemo  . Bronchitis last 2 weeks   saw dr Felipa Eth 02-28-2014, he said no antibiotics needed, nonproductive cough  . Complication of anesthesia   . Cystitis    cytoxen, had once or twice  . Family history of breast cancer   . History of breast cancer   . History of radiation therapy 2/10, 2/12, 2/18, 2/24, 05/29/14   vaginal cuff/ 30 Gy/ 5 fx  . Macular degeneration   . Neck pain    taking physictal therapy last 2 weeks  . Osteoporosis    on Evista  . PONV (postoperative nausea and vomiting)   . Shoulder pain    taking physical therapy for last few weeks  . Uterine cancer ([place]) 03/21/2014   MLH1/PMS2 LOH  . Uterine fibroid   . VAIN I (vaginal intraepithelial neoplasia grade I) 2018   positive HR HPV.  . Vasovagal syncopes     Past Surgical History:  Procedure Laterality Date  . BREAST IMPLANT REMOVAL  6/94   left breast  . BREAST RECONSTRUCTION  1990   left  . CATARACT EXTRACTION Bilateral   . EXCISION VAGINAL CYST    . MASTECTOMY Right 10/08/06   prophylactic  . RADICAL MASTECTOMY LND  1990   left, chemo done  . ROBOTIC ASSISTED TOTAL HYSTERECTOMY WITH  BILATERAL SALPINGO OOPHERECTOMY Bilateral 03/21/2014   Procedure: ROBOTIC ASSISTED TOTAL HYSTERECTOMY WITH BILATERAL SALPINGO OOPHORECTOMY ;  Surgeon: Janie Morning, MD;  Location: [facility];  Service: Gynecology;  Laterality: Bilateral;    There were no vitals filed for this visit.  Subjective Assessment - 05/13/17 0936    Subjective  I am seeing Dr. Layne Benton 05/20/2017.      Pertinent History  Breast cancer; osteoporosis    Patient Stated Goals  reduce pain; improve mobility     Currently in Pain?  Yes    Pain Score  1     Pain Location  Shoulder    Pain Orientation  Right    Pain Descriptors / Indicators  Sore    Pain Type  Acute pain    Pain Onset  More than a month ago    Pain Frequency  Intermittent    Aggravating Factors   nothing consistently    Pain Relieving Factors  some exercises, e-stim    Multiple Pain Sites  No         [facility] PT Assessment - 05/13/17 0001      Assessment   Medical Diagnosis  right shoulder impringement/rotator cuff tendiopathy    Referring Provider  Dr. Wandra Feinstein    Onset Date/Surgical  Date  12/17/16    Hand Dominance  Right    Prior Therapy  yes      Precautions   Precautions  Other (comment)    Precaution Comments  breast cancer; osteoporosis      Restrictions   Weight Bearing Restrictions  No      Balance Screen   Has the patient fallen in the past 6 months  No    Has the patient had a decrease in activity level because of a fear of falling?   No    Is the patient reluctant to leave their home because of a fear of falling?   No      Home Film/video editor residence      Prior Function   Level of Independence  Independent    Vocation  Retired      Associate Professor   Overall Cognitive Status  Within Functional Limits for tasks assessed      Observation/Other Assessments   Focus on Therapeutic Outcomes (FOTO)   39% limitation      Posture/Postural Control   Posture/Postural Control  Postural limitations     Postural Limitations  Forward head;Rounded Shoulders;Increased thoracic kyphosis      AROM   Right Shoulder Flexion  170 Degrees    Right Shoulder ABduction  165 Degrees    Right Shoulder Internal Rotation  55 Degrees T5    Right Shoulder External Rotation  70 Degrees      PROM   Right Shoulder Internal Rotation  60 Degrees    Right Shoulder External Rotation  75 Degrees      Strength   Right Shoulder Flexion  4+/5    Right Shoulder Extension  5/5    Right Shoulder ABduction  4+/5    Right Shoulder Internal Rotation  5/5    Right Shoulder External Rotation  4+/5      Palpation   Palpation comment  tenderness located in anterior right A-C joint, right supraspinatus muscle belly                  [facility] Adult PT Treatment/Exercise - 05/13/17 0001      Posture/Postural Control   Posture Comments  cervical thoracic kyphosis has decreased by 50%      Shoulder Exercises: Seated   Other Seated Exercises  3 way raise 1# 8x each standing      Shoulder Exercises: Standing   External Rotation  Strengthening;Both;20 reps;Theraband    Theraband Level (Shoulder External Rotation)  Level 2 (Red) VC for posture    Row  Strengthening;Both;20 reps;Theraband;10 reps tactile cues for raising chest and squeeze shoulder blades    Theraband Level (Shoulder Row)  Level 4 (Blue)    Shoulder Elevation  -- CKC ball rolls on wall 10x each; VC/TC for technique    Other Standing Exercises  2# 5x first shelf, 5x second shelf, then repeat    Other Standing Exercises  wall push ups 10x 2 x;  VC to hug her shoulder baldes      Shoulder Exercises: Pulleys   Flexion  3 minutes    ABduction  3 minutes    Other Pulley Exercises  Behind the back 3 min               PT Short Term Goals - 05/13/17 3536      PT SHORT TERM GOAL #1   Title  be independent in initial HEP    Time  4  Period  Weeks    Status  Achieved      PT SHORT TERM GOAL #2   Title  right shoulder pain decreased >/=  25% with sleeping on it    Time  4    Period  Weeks    Status  Achieved      PT SHORT TERM GOAL #3   Title  right shoulder pain decreased >/= 25% with reaching overhead using right arm    Time  4    Period  Weeks    Status  Achieved        PT Long Term Goals - 05/13/17 1941      PT LONG TERM GOAL #1   Title  be independent in advanced HEP    Time  4    Period  Weeks    Status  Achieved      PT LONG TERM GOAL #2   Title  reduce FOTO to < 36% to demo improved mobility and decreased pain    Baseline  39% limitation    Time  8    Period  Weeks    Status  Not Met      PT LONG TERM GOAL #3   Title  reach overhead with right arm with pain decreased >/= 75% due to increased right shoulder strength >/= 4/5    Time  8    Period  Weeks    Status  Achieved      PT LONG TERM GOAL #4   Title  lay on right arm with pain decreased >/= 75% due to decreased inflammation     Time  8    Period  Weeks    Status  Achieved      PT LONG TERM GOAL #5   Title  reach behind her back for an item due to right shoulder rotation improved by 10-15 degrees    Time  8    Period  Days    Status  Achieved            Plan - 05/13/17 1000    Clinical Impression Statement  Patient has full right shoulder ROM.  Patient is able to fully reach behind her back with right arm.  Patient strength of right shoulder 4+/5.  Patient is able to sleep through the night but will wake up wiht right shoulder pain.  Patient has palpable pain in anterior right A-C joint and right supraspinaltus muscle belly.  Patient has met all of her goals except for her FOTO score is 39% limitation instead of 36% limitation.     Rehab Potential  Excellent    Clinical Impairments Affecting Rehab Potential  Breast cancer with no radiation;  ovarian cancer with radiation osteoporosis; masectomy; macular degeneration    PT Treatment/Interventions  Iontophoresis '4mg'$ /ml Dexamethasone;Therapeutic activities;Therapeutic  exercise;Electrical Stimulation;Neuromuscular re-education;Patient/family education;Manual techniques;Passive range of motion;Dry needling    PT Next Visit Plan  discharge to HEP this visit    PT Home Exercise Plan  Discharge to HEP    Consulted and Agree with Plan of Care  Patient       Patient will benefit from skilled therapeutic intervention in order to improve the following deficits and impairments:  Pain, Increased fascial restricitons, Increased muscle spasms, Decreased strength, Decreased range of motion, Decreased activity tolerance  Visit Diagnosis: Muscle weakness (generalized)  Stiffness of right shoulder, not elsewhere classified  Acute pain of right shoulder     Problem List Patient Active Problem List  Diagnosis Date Noted  . Genetic testing 05/23/2014  . History of breast cancer   . Family history of breast cancer   . Endometrial cancer ([place]) 03/21/2014  . Uterine cancer ([place]) 03/21/2014  . S/P mastectomy, bilateral 05/02/2013  . Situational stress 05/02/2013  . Osteoporosis, unspecified 05/02/2013  . Breast cancer, left breast ([place]) 05/02/2013    Beth Simon, PT 05/13/17 10:08 AM   [facility] [address], [place], [place], [address] Phone: [phone]   Fax:  [phone]  Name: Beth Simon MRN: [id number]   PHYSICAL THERAPY DISCHARGE SUMMARY  Visits from Start of Care: 15  Current functional level related to goals / functional outcomes: See above.    Remaining deficits: See above.    Education / Equipment: HEP Plan: Patient agrees to discharge.  Patient goals were met. Patient is being discharged due to meeting the stated rehab goals. Thank you for the referral.Beth Simon, PT 05/13/17 10:08 AM   ?????      Date of Birth: 26-Aug-1940

## 2017-05-18 ENCOUNTER — Encounter: Payer: Self-pay | Admitting: Podiatry

## 2017-05-18 ENCOUNTER — Ambulatory Visit: Payer: PPO | Admitting: Podiatry

## 2017-05-18 DIAGNOSIS — Q828 Other specified congenital malformations of skin: Secondary | ICD-10-CM | POA: Diagnosis not present

## 2017-05-18 DIAGNOSIS — B351 Tinea unguium: Secondary | ICD-10-CM | POA: Diagnosis not present

## 2017-05-18 DIAGNOSIS — M79676 Pain in unspecified toe(s): Secondary | ICD-10-CM | POA: Diagnosis not present

## 2017-05-19 ENCOUNTER — Ambulatory Visit: Payer: PPO | Admitting: Nurse Practitioner

## 2017-05-20 NOTE — Progress Notes (Signed)
Patient ID: Beth Simon, female   DOB: 02/03/1941, 77 y.o.   MRN: [id number]  Subjective: 77 y.o. returns the office today for painful, elongated, thickened toenails which she is unable to trim herself. Denies any redness or drainage around the nails. Denies any swelling redness or drainage to the nail sites. She also has calluses to both of her feet that she would like to have trimmed. Denies any acute changes since last appointment and no new complaints today. Denies any systemic complaints such as fevers, chills, nausea, vomiting.   Objective: AAO 3, NAD DP/PT pulses palpable, CRT less than 3 seconds Nails hypertrophic, dystrophic, elongated, brittle, discolored 10. There is tenderness overlying the nails 1-5 bilaterally. There is no surrounding erythema or drainage along the nail sites. Hyperkeratotic lesions of the distal aspect left second digit and dorsal lateral 5th digit. Upon debridement there is no underlying ulceration, drainage or other signs of infection. No open lesions or pre-ulcerative lesions are identified. No pain with calf compression, swelling, warmth, erythema.  Assessment: Patient presents with symptomatic onychomycosis; hyperkeratotic lesions  Plan: -Treatment options including alternatives, risks, complications were discussed -Nails sharply debrided 10 without complication/bleeding. -Hyperkeratotic lesion sharply debrided 2 without complication/bleeding. -Discussed daily foot inspection. If there are any changes, to call the office immediately.  -Follow-up in 6 weeks at her request or sooner if any problems are to arise. In the meantime, encouraged to call the office with any questions, concerns, changes symptoms.  Celesta Gentile, DPM

## 2017-05-27 DIAGNOSIS — M25511 Pain in right shoulder: Secondary | ICD-10-CM | POA: Diagnosis not present

## 2017-06-03 ENCOUNTER — Ambulatory Visit (INDEPENDENT_AMBULATORY_CARE_PROVIDER_SITE_OTHER): Payer: PPO | Admitting: Obstetrics and Gynecology

## 2017-06-03 ENCOUNTER — Encounter: Payer: Self-pay | Admitting: Obstetrics and Gynecology

## 2017-06-03 ENCOUNTER — Other Ambulatory Visit (HOSPITAL_COMMUNITY)
Admission: RE | Admit: 2017-06-03 | Discharge: 2017-06-03 | Disposition: A | Discharge status: Home / Self Care | Payer: PPO | Source: Ambulatory Visit | Attending: Obstetrics and Gynecology | Admitting: Obstetrics and Gynecology

## 2017-06-03 ENCOUNTER — Other Ambulatory Visit: Payer: Self-pay

## 2017-06-03 VITALS — BP 164/76 | HR 64 | Resp 16 | Ht 59.0 in | Wt 135.0 lb

## 2017-06-03 DIAGNOSIS — Z8542 Personal history of malignant neoplasm of other parts of uterus: Secondary | ICD-10-CM | POA: Diagnosis not present

## 2017-06-03 DIAGNOSIS — R159 Full incontinence of feces: Secondary | ICD-10-CM

## 2017-06-03 DIAGNOSIS — Z01419 Encounter for gynecological examination (general) (routine) without abnormal findings: Secondary | ICD-10-CM | POA: Diagnosis not present

## 2017-06-03 DIAGNOSIS — N393 Stress incontinence (female) (male): Secondary | ICD-10-CM

## 2017-06-03 NOTE — Progress Notes (Signed)
77 y.o. G57P1001 Widowed Caucasian female here for annual exam.    Feels better now 4 years post death of spouse.  Hospice has helped her a lot.   Hx of endometrial cancer.  Tx with robotic hysterectomy/BSO and vaginal brachytherapy.  Pap smear 05/16/16 - negative and positive HR HPV.  Colposcopy with vaginal biopsy 06/02/16 - VAIN I.   Saw Dr. Skeet Latch and Dr. Isidore Moos in follow up.  Pap recommended in one year (now).  Using vaginal dilator once a week.  No vaginal bleeding. Some pelvic pain every now and then for patient.  She states she has more an awareness of something and not really pain.  States it may be gas.  Has BM daily.  No blood in stool or black stools.  Denies blood in the urine.   Vulvar itching and loss of urine with coughing/sneezing.  Does state rectal leakage of stool. Occasional use of magnesium.  Did physical therapy for pelvic floor through [facility].   Hx breast cancer 1990.  Starting to do exercise.   PCP:   Dr. Felipa Eth  Patient's last menstrual period was 03/31/1988 (approximate).           Sexually active: No.  The current method of family planning is status post hysterectomy.    Exercising: Yes.    Pilates Smoker:  no  Health Maintenance: Pap:  05-16-16 Neg:Pos HR HPV; 06/02/16 Vaginal biopsies from colposcopy showed VAIN I.  History of abnormal Pap:  yes MMG:  Bilateral mastectomy Colonoscopy:  2017, polyps, repeat in 3 years per patient BMD:   05/02/16  Result  T Score, -1.5 Spine / -1.5 Right Femur Neck / -1.6 Left Femur Neck / -2.6 Left 1/3 Radius.  Dr .Wandra Feinstein at [facility] is following her for this. TDaP:  12/23/07 Gardasil:   n/a HIV: donated blood in the past Hep C: donated blood in the past Screening Labs:  PCP   reports that  has never smoked. she has never used smokeless tobacco. She reports that she does not drink alcohol or use drugs.  Past Medical History:  Diagnosis Date  . Adenomatous polyps 01/2016  . Anemia    . Breast cancer ([place]) 1990   bilateral mastectomy, adenoca breast-left MRM, reconstruction, chemo  . Bronchitis last 2 weeks   saw dr Felipa Eth 02-28-2014, he said no antibiotics needed, nonproductive cough  . Complication of anesthesia   . Cystitis    cytoxen, had once or twice  . Family history of breast cancer   . History of breast cancer   . History of radiation therapy 2/10, 2/12, 2/18, 2/24, 05/29/14   vaginal cuff/ 30 Gy/ 5 fx  . Macular degeneration   . Neck pain    taking physictal therapy last 2 weeks  . Osteoporosis    on Evista  . PONV (postoperative nausea and vomiting)   . Shoulder pain    taking physical therapy for last few weeks  . Uterine cancer ([place]) 03/21/2014   MLH1/PMS2 LOH  . Uterine fibroid   . VAIN I (vaginal intraepithelial neoplasia grade I) 2018   positive HR HPV.  . Vasovagal syncopes     Past Surgical History:  Procedure Laterality Date  . BREAST IMPLANT REMOVAL  6/94   left breast  . BREAST RECONSTRUCTION  1990   left  . CATARACT EXTRACTION Bilateral   . EXCISION VAGINAL CYST    . MASTECTOMY Right 10/08/06   prophylactic  . RADICAL MASTECTOMY LND  1990  left, chemo done  . ROBOTIC ASSISTED TOTAL HYSTERECTOMY WITH BILATERAL SALPINGO OOPHERECTOMY Bilateral 03/21/2014   Procedure: ROBOTIC ASSISTED TOTAL HYSTERECTOMY WITH BILATERAL SALPINGO OOPHORECTOMY ;  Surgeon: Janie Morning, MD;  Location: [facility];  Service: Gynecology;  Laterality: Bilateral;    Current Outpatient Medications  Medication Sig Dispense Refill  . Coenzyme Q10 (COQ10 PO) Take 1 tablet by mouth every morning.     . Cyanocobalamin (VITAMIN B 12 PO) Take 1 tablet by mouth every morning.     . denosumab (PROLIA) 60 MG/ML SOLN injection Inject 60 mg into the skin every 6 (six) months. Administer in upper arm, thigh, or abdomen    . Flaxseed, Linseed, (FLAX SEEDS PO) Take 1 scoop by mouth every morning. Ground flaxseed in cereal.    . MELATONIN PO Take 1 tablet by mouth at  bedtime as needed (sleep.).     Marland Kitchen Omega-3 Fatty Acids (FISH OIL PO) Take 1 capsule by mouth every morning.     Marland Kitchen OVER THE COUNTER MEDICATION Take 1 tablet by mouth every morning. Astazanthin. (anti-oxidant)    . VITAMIN D, CHOLECALCIFEROL, PO Take 4,000 Int'l Units by mouth every morning.      No current facility-administered medications for this visit.     Family History  Problem Relation Age of Onset  . Breast cancer Sister 54       recurrence age 39  . Breast cancer Paternal Grandmother   . Heart disease Mother   . Heart disease Father   . Hypertension Sister   . Dementia Sister   . Breast cancer Maternal Aunt 68  . Dementia Maternal Aunt        dx in her 1s    ROS:  Pertinent items are noted in HPI.  Otherwise, a comprehensive ROS was negative.  Exam:   BP (!) 164/76 (BP Location: Right Arm, Patient Position: Sitting, Cuff Size: Normal)   Pulse 64   Resp 16   Ht 4' 11" (1.499 m)   Wt 135 lb (61.2 kg)   LMP 03/31/1988 (Approximate)   BMI 27.27 kg/m     General appearance: alert, cooperative and appears stated age Head: Normocephalic, without obvious abnormality, atraumatic Neck: no adenopathy, supple, symmetrical, trachea midline and thyroid normal to inspection and palpation Lungs: clear to auscultation bilaterally Breasts: normal appearance, no masses or tenderness, No nipple retraction or dimpling, No nipple discharge or bleeding, No axillary or supraclavicular adenopathy Heart: regular rate and rhythm Abdomen: soft, non-tender; no masses, no organomegaly Extremities: extremities normal, atraumatic, no cyanosis or edema Skin: Skin color, texture, turgor normal. No rashes or lesions Lymph nodes: Cervical, supraclavicular, and axillary nodes normal. No abnormal inguinal nodes palpated Neurologic: Grossly normal  Pelvic: External genitalia:  no lesions              Urethra:  normal appearing urethra with no masses, tenderness or lesions              Bartholins and  Skenes: normal                 Vagina: normal appearing vagina with normal color and discharge, no lesions              Cervix:  Absent.              Pap taken: Yes.   Bimanual Exam:  Uterus:   Absent.               Adnexa: no mass, fullness, tenderness  Rectal exam: Yes.  .  Confirms.              Anus:  normal sphincter tone, no lesions  Chaperone was present for exam.  Assessment:   Well woman visit with normal exam. Status post robotic hysterectomy with BSO.  Status post vaginal brachytherapy. Vulvar pruritis.  No lesions seen.  Fecal soiling.  Urinary incontinence.  Status post bilateral mastectomy.  Osteoporosis.  On Prolia through ortho.   Plan: Mammogram screening discussed. Recommended self breast awareness. Pap and HR HPV as above. Guidelines for Calcium, Vitamin D, regular exercise program including cardiovascular and weight bearing exercise. Follow up with Dr. Skeet Latch and Dr. Isidore Moos.  Use vaginal dilator twice a week.  Refer back to physical therapy.  Metamucil. Hydrocortisone cream to vulva prn.  Rx for mastectomy/prosthetic supplies for one year - hand written.  Follow up annually and prn.   After visit summary provided.

## 2017-06-03 NOTE — Patient Instructions (Signed)

## 2017-06-05 LAB — CYTOLOGY - PAP
Diagnosis: NEGATIVE
HPV (WINDOPATH): NOT DETECTED

## 2017-06-09 ENCOUNTER — Other Ambulatory Visit: Payer: Self-pay

## 2017-06-09 ENCOUNTER — Ambulatory Visit: Payer: PPO | Attending: Obstetrics and Gynecology | Admitting: Physical Therapy

## 2017-06-09 ENCOUNTER — Encounter: Payer: Self-pay | Admitting: Physical Therapy

## 2017-06-09 DIAGNOSIS — M6281 Muscle weakness (generalized): Secondary | ICD-10-CM

## 2017-06-09 DIAGNOSIS — R278 Other lack of coordination: Secondary | ICD-10-CM | POA: Diagnosis not present

## 2017-06-09 NOTE — Therapy (Signed)
[facility] [address], [place], [place], [address] Phone: [phone]   Fax:  [phone]  Physical Therapy Evaluation  Patient Details  Name: Beth Simon MRN: [id number] Date of Birth: 1940/07/13 Referring Provider: Dr. Josefa Half   Encounter Date: 06/09/2017  PT End of Session - 06/09/17 1523    Visit Number  1    Date for PT Re-Evaluation  08/04/17    Authorization Type  Medicare KX on 3rd  visit    PT Start Time  1445    PT Stop Time  1523    PT Time Calculation (min)  38 min    Activity Tolerance  Patient tolerated treatment well    Behavior During Therapy  [facility] for tasks assessed/performed       Past Medical History:  Diagnosis Date  . Adenomatous polyps 01/2016  . Anemia   . Breast cancer ([place]) 1990   bilateral mastectomy, adenoca breast-left MRM, reconstruction, chemo  . Bronchitis last 2 weeks   saw dr Felipa Eth 02-28-2014, he said no antibiotics needed, nonproductive cough  . Complication of anesthesia   . Cystitis    cytoxen, had once or twice  . Family history of breast cancer   . History of breast cancer   . History of radiation therapy 2/10, 2/12, 2/18, 2/24, 05/29/14   vaginal cuff/ 30 Gy/ 5 fx  . Macular degeneration   . Neck pain    taking physictal therapy last 2 weeks  . Osteoporosis    on Evista  . PONV (postoperative nausea and vomiting)   . Shoulder pain    taking physical therapy for last few weeks  . Uterine cancer ([place]) 03/21/2014   MLH1/PMS2 LOH  . Uterine fibroid   . VAIN I (vaginal intraepithelial neoplasia grade I) 2018   positive HR HPV.  . Vasovagal syncopes     Past Surgical History:  Procedure Laterality Date  . BREAST IMPLANT REMOVAL  6/94   left breast  . BREAST RECONSTRUCTION  1990   left  . CATARACT EXTRACTION Bilateral   . EXCISION VAGINAL CYST    . MASTECTOMY Right 10/08/06   prophylactic  . RADICAL MASTECTOMY LND  1990   left, chemo done  .  ROBOTIC ASSISTED TOTAL HYSTERECTOMY WITH BILATERAL SALPINGO OOPHERECTOMY Bilateral 03/21/2014   Procedure: ROBOTIC ASSISTED TOTAL HYSTERECTOMY WITH BILATERAL SALPINGO OOPHORECTOMY ;  Surgeon: Janie Morning, MD;  Location: [facility];  Service: Gynecology;  Laterality: Bilateral;    There were no vitals filed for this visit.   Subjective Assessment - 06/09/17 1450    Subjective  Patient reports her fecal and stress incontinence started within the last year. leakage without sensation. Patient will wear a pad.     Pertinent History  Breast cancer; osteoporosis; endometrial cancer    Patient Stated Goals  improve the leakage    Currently in Pain?  No/denies         [facility] PT Assessment - 06/09/17 0001      Assessment   Medical Diagnosis  R15.9 Incontinence of feces, unspecified fecal incontinence; N39.3 stress incontinence    Referring Provider  Dr. Josefa Half    Onset Date/Surgical Date  06/09/16    Prior Therapy  urinary incontinence      Precautions   Precautions  Other (comment)    Precaution Comments  breast cancer; osteoporosis      Restrictions   Weight Bearing Restrictions  No      Balance Screen  Has the patient fallen in the past 6 months  No    Has the patient had a decrease in activity level because of a fear of falling?   No    Is the patient reluctant to leave their home because of a fear of falling?   No      Home Film/video editor residence      Prior Function   Level of [place]  Retired      Associate Professor   Overall Cognitive Status  Within Functional Limits for tasks assessed      Posture/Postural Control   Posture/Postural Control  Postural limitations    Postural Limitations  Forward head;Rounded Shoulders;Increased thoracic kyphosis    Posture Comments  cervical thoracic kyphosis has decreased by 50%      Strength   Strength Assessment Site  Hip    Right Hip Extension  4/5    Right Hip ABduction  4/5     Left Hip ABduction  4/5      Transfers   Transfers  Not assessed      Ambulation/Gait   Ambulation/Gait  No             Objective measurements completed on examination: See above findings.    Pelvic Floor Special Questions - 06/09/17 0001    Currently Sexually Active  No    Urinary Leakage  Yes    Pad use  1 per day    Activities that cause leaking  Coughing when she had the flu    Fecal incontinence  Yes 2 times per week    Falling out feeling (prolapse)  No    Pelvic Floor Internal Exam  Patient confirms identification and approves PT to assess pelvic floor muscles and treatment.     Exam Type  Rectal    Sensation  able to feel therapist finger in the canal    Palpation  the puborectalis muscle does not come forward with contraction and patient holds breath    Strength  good squeeze, good lift, able to hold agaisnt strong resistance               PT Education - 06/09/17 1521    Education provided  Yes    Education Details  pelvic floor contraction in sitting    Person(s) Educated  Patient    Methods  Explanation;Demonstration;Verbal cues;Handout    Comprehension  Returned demonstration;Verbalized understanding       PT Short Term Goals - 06/09/17 1529      PT SHORT TERM GOAL #1   Title  be independent in initial HEP    Time  4    Period  Weeks    Status  New    Target Date  07/07/17      PT SHORT TERM GOAL #2   Title  fecal leakage decreased >/= 25% due to improve strength and coordination    Time  4    Period  Weeks    Status  New    Target Date  07/07/17      PT SHORT TERM GOAL #3   Title  ------      PT SHORT TERM GOAL #4   Title  --------        PT Long Term Goals - 06/09/17 1536      PT LONG TERM GOAL #1   Title  be independent in advanced HEP    Time  8    Period  Weeks    Status  New    Target Date  08/04/17      PT LONG TERM GOAL #2   Title  fecal leakage decreased >/= 75% due to improved strength and endurance      Baseline  ---    Time  8    Period  Weeks    Status  New    Target Date  08/04/17      PT LONG TERM GOAL #3   Title  ability to feel the leakage if it happens due to increased sensation of the anal sphincter    Time  8    Period  Weeks    Status  New    Target Date  08/04/17      PT LONG TERM GOAL #4   Title  ------      PT LONG TERM GOAL #5   Title  ------             Plan - 06/09/17 1523    Clinical Impression Statement  Patient is a 77 year old female with fecal incontinence for the past year.  Patient reports she does not feel the leakage come out and it is not always with activity.  Patient has a history of Endometrial cancer with radiation for treatment.  Pelvic floor strength in the anal region is 4/5 with the puborectalis does not come forward during the contraction. Patient anal sphincter muscle will quiver with contraction.  Patient wears 1 pad per day and has fecal leakage 2 times per week.  Bilateral hip abduction and right hip extension 4/5.  Patient will benefit from skilled therapy to improve pelvic floor strength to reduce the fecal leakage and improve sensory awareness of the leakage.     History and Personal Factors relevant to plan of care:  breast cancer; ovarian cancer with radiation; osteoporosis; masectomy; macular degeneration    Clinical Presentation  Stable    Clinical Presentation due to:  stable condition    Clinical Decision Making  Low    Rehab Potential  Excellent    Clinical Impairments Affecting Rehab Potential  breast cancer; ovarian cancer with radiation; osteoporosis; masectomy; macular degeneration    PT Frequency  1x / week    PT Duration  8 weeks    PT Treatment/Interventions  Biofeedback;Therapeutic activities;Therapeutic exercise;Patient/family education;Neuromuscular re-education;Manual techniques    PT Next Visit Plan  pelvic floor strength with activity in sitting and standing with EMG    PT Home Exercise Plan  progress as needed     Recommended Other Services  initial sent 06/09/2017    Consulted and Agree with Plan of Care  Patient       Patient will benefit from skilled therapeutic intervention in order to improve the following deficits and impairments:  Decreased coordination, Decreased endurance, Decreased activity tolerance, Decreased strength  Visit Diagnosis: Muscle weakness (generalized) - Plan: PT plan of care cert/re-cert  Other lack of coordination - Plan: PT plan of care cert/re-cert     Problem List Patient Active Problem List   Diagnosis Date Noted  . Genetic testing 05/23/2014  . History of breast cancer   . Family history of breast cancer   . Endometrial cancer ([place]) 03/21/2014  . Uterine cancer ([place]) 03/21/2014  . S/P mastectomy, bilateral 05/02/2013  . Situational stress 05/02/2013  . Osteoporosis, unspecified 05/02/2013  . Breast cancer, left breast ([place]) 05/02/2013    Earlie Counts, PT 06/09/17 3:41 PM  [facility] [address] [place], [place], [address] Phone: [phone]   Fax:  [phone]  Name: Beth Simon MRN: [id number] Date of Birth: 08-26-1940

## 2017-06-09 NOTE — Patient Instructions (Addendum)
Quick Contraction: Gravity Resisted (Sitting)    Sitting, quickly squeeze then fully relax pelvic floor. Perform __1_ sets of __5_. Rest for _1__ seconds between sets. Do _4__ times a day.  Copyright  VHI. All rights reserved.  Slow Contraction: Gravity Resisted (Sitting)    Sitting, slowly squeeze pelvic floor for _10__ seconds. Rest for __5_ seconds. Repeat __10_ times. Do __4_ times a day.  Copyright  VHI. All rights reserved.  [facility] [address] [place], [place] [address] Phone # [phone] Fax [phone]

## 2017-06-10 ENCOUNTER — Telehealth: Payer: Self-pay | Admitting: Obstetrics and Gynecology

## 2017-06-10 NOTE — Telephone Encounter (Signed)
Patient called requesting a call back from the nurse. She has some questions about her pap smear results from this year compared to her last one.

## 2017-06-11 NOTE — Telephone Encounter (Signed)
Spoke with patient. Patient is reviewing pap results via MyChart, calling to review 05/16/16 and 06/03/17 pap results with hpv. Reviewed results with patient, questions answered. Patient has f/u with Dr. Skeet Latch on 06/23/17, wanted to review prior. Patient thankful for return call.   Routing to provider for final review. Patient is agreeable to disposition. Will close encounter.

## 2017-06-22 ENCOUNTER — Ambulatory Visit: Payer: PPO | Admitting: Physical Therapy

## 2017-06-22 ENCOUNTER — Encounter: Payer: Self-pay | Admitting: Physical Therapy

## 2017-06-22 DIAGNOSIS — M6281 Muscle weakness (generalized): Secondary | ICD-10-CM

## 2017-06-22 DIAGNOSIS — R278 Other lack of coordination: Secondary | ICD-10-CM

## 2017-06-22 NOTE — Therapy (Signed)
[facility] [address] [place], [place], [address] Phone: [phone]   Fax:  [phone]  Physical Therapy Treatment  Patient Details  Name: Beth Simon MRN: [id number] Date of Birth: 19-Mar-1941 Referring Provider: Dr. Josefa Half   Encounter Date: 06/22/2017  PT End of Session - 06/22/17 1226    Visit Number  2    Date for PT Re-Evaluation  08/04/17    Authorization Type  Medicare KX on 3rd  visit    PT Start Time  1145    PT Stop Time  1226    PT Time Calculation (min)  41 min    Activity Tolerance  Patient tolerated treatment well    Behavior During Therapy  [facility] for tasks assessed/performed       Past Medical History:  Diagnosis Date  . Adenomatous polyps 01/2016  . Anemia   . Breast cancer ([place]) 1990   bilateral mastectomy, adenoca breast-left MRM, reconstruction, chemo  . Bronchitis last 2 weeks   saw dr Felipa Eth 02-28-2014, he said no antibiotics needed, nonproductive cough  . Complication of anesthesia   . Cystitis    cytoxen, had once or twice  . Family history of breast cancer   . History of breast cancer   . History of radiation therapy 2/10, 2/12, 2/18, 2/24, 05/29/14   vaginal cuff/ 30 Gy/ 5 fx  . Macular degeneration   . Neck pain    taking physictal therapy last 2 weeks  . Osteoporosis    on Evista  . PONV (postoperative nausea and vomiting)   . Shoulder pain    taking physical therapy for last few weeks  . Uterine cancer ([place]) 03/21/2014   MLH1/PMS2 LOH  . Uterine fibroid   . VAIN I (vaginal intraepithelial neoplasia grade I) 2018   positive HR HPV.  . Vasovagal syncopes     Past Surgical History:  Procedure Laterality Date  . BREAST IMPLANT REMOVAL  6/94   left breast  . BREAST RECONSTRUCTION  1990   left  . CATARACT EXTRACTION Bilateral   . EXCISION VAGINAL CYST    . MASTECTOMY Right 10/08/06   prophylactic  . RADICAL MASTECTOMY LND  1990   left, chemo done  .  ROBOTIC ASSISTED TOTAL HYSTERECTOMY WITH BILATERAL SALPINGO OOPHERECTOMY Bilateral 03/21/2014   Procedure: ROBOTIC ASSISTED TOTAL HYSTERECTOMY WITH BILATERAL SALPINGO OOPHORECTOMY ;  Surgeon: Janie Morning, MD;  Location: [facility];  Service: Gynecology;  Laterality: Bilateral;    There were no vitals filed for this visit.  Subjective Assessment - 06/22/17 1151    Subjective  I have not had leakage since I saw you. When I have the urge to have bowel movement, the pressure of the stool trying to come out and I stop it and I have pain.     Pertinent History  Breast cancer; osteoporosis; endometrial cancer    Patient Stated Goals  improve the leakage    Currently in Pain?  Yes    Pain Score  5     Pain Location  Rectum    Pain Orientation  Mid    Pain Descriptors / Indicators  Pressure    Pain Type  Acute pain    Pain Onset  More than a month ago    Pain Frequency  Intermittent    Aggravating Factors   holding a bowel movement prior to going to the commode    Pain Relieving Factors  bowel movement    Multiple Pain  Sites  No                No data recorded    Pelvic Floor Special Questions - 06/22/17 0001    Urinary urgency  Yes for bowel movement    Fecal incontinence  No    Falling out feeling (prolapse)  No        [facility] Adult PT Treatment/Exercise - 06/22/17 0001      Manual Therapy   Manual Therapy  Soft tissue mobilization    Soft tissue mobilization  obturator internist and levator ani in sidely and sitting with trunk movements             PT Education - 06/22/17 1224    Education provided  Yes    Education Details  pelvic floor contraction in different positions    Person(s) Educated  Patient    Methods  Explanation;Demonstration;Handout    Comprehension  Verbalized understanding;Returned demonstration       PT Short Term Goals - 06/22/17 1229      PT SHORT TERM GOAL #1   Title  be independent in initial HEP    Time  4    Period  Weeks     Status  Achieved      PT SHORT TERM GOAL #2   Title  fecal leakage decreased >/= 25% due to improve strength and coordination    Time  4    Period  Weeks    Status  Achieved        PT Long Term Goals - 06/09/17 1536      PT LONG TERM GOAL #1   Title  be independent in advanced HEP    Time  8    Period  Weeks    Status  New    Target Date  08/04/17      PT LONG TERM GOAL #2   Title  fecal leakage decreased >/= 75% due to improved strength and endurance     Baseline  ---    Time  8    Period  Weeks    Status  New    Target Date  08/04/17      PT LONG TERM GOAL #3   Title  ability to feel the leakage if it happens due to increased sensation of the anal sphincter    Time  8    Period  Weeks    Status  New    Target Date  08/04/17      PT LONG TERM GOAL #4   Title  ------      PT LONG TERM GOAL #5   Title  ------            Plan - 06/22/17 1156    Clinical Impression Statement  Patient has not had fecal incontinence since initial visit.  Patient will have pain in the rectum when she has to hold back the stool to get to the bathroom.  Pain is better after the bowel movement.  Patient had some tightness in the levator ani and obturator internist but after manual work the muscles were softer.  Patient can wait one minute to hold back the stool but then needs to be at the commode.  Patient will benefit from skilled therapy to improve pelvic floor strength to reduce fecal leakage and improve endurance of the muscles.     Rehab Potential  Excellent    Clinical Impairments Affecting Rehab Potential  breast cancer; ovarian cancer with radiation;  osteoporosis; masectomy; macular degeneration    PT Frequency  1x / week    PT Duration  8 weeks    PT Treatment/Interventions  Biofeedback;Therapeutic activities;Therapeutic exercise;Patient/family education;Neuromuscular re-education;Manual techniques    PT Next Visit Plan  pelvic floor strength with activity in sitting and  standing with EMG    PT Home Exercise Plan  progress as needed    Consulted and Agree with Plan of Care  Patient       Patient will benefit from skilled therapeutic intervention in order to improve the following deficits and impairments:  Decreased coordination, Decreased endurance, Decreased activity tolerance, Decreased strength  Visit Diagnosis: Muscle weakness (generalized)  Other lack of coordination     Problem List Patient Active Problem List   Diagnosis Date Noted  . Genetic testing 05/23/2014  . History of breast cancer   . Family history of breast cancer   . Endometrial cancer ([place]) 03/21/2014  . Uterine cancer ([place]) 03/21/2014  . S/P mastectomy, bilateral 05/02/2013  . Situational stress 05/02/2013  . Osteoporosis, unspecified 05/02/2013  . Breast cancer, left breast ([place]) 05/02/2013    Earlie Counts, PT 06/22/17 12:30 PM   [facility] [address] [place], [place], [address] Phone: [phone]   Fax:  [phone]  Name: Beth Simon MRN: [id number] Date of Birth: 1940/10/13

## 2017-06-22 NOTE — Patient Instructions (Addendum)
Lower abdominal/core stability exercises  1. Practice your breathing technique: Inhale through your nose expanding your belly and rib cage. Try not to breathe into your chest. Exhale slowly and gradually out your mouth feeling a sense of softness to your body. Practice multiple times. This can be performed unlimited.  2. Finding the lower abdominals. Laying on your back with the knees bent, place your fingers just below your belly button. Using your breathing technique from above, on your exhale gently pull the belly button away from your fingertips without tensing any other muscles. Practice this 5x. Next, as you exhale, draw belly button inwards and hold onto it...then feel as if you are pulling that muscle across your pelvis like you are tightening a belt. This can be hard to do at first so be patient and practice. Do 5-10 reps 1-3 x day. Always recognize quality over quantity; if your abdominal muscles become tired you will notice you may tighten/contract other muscles. This is the time to take a break.   Practice this first laying on your back, then in sitting, progressing to standing and finally adding it to all your daily movements.   3. Finding your pelvic floor. Using the breathing technique above, when your exhale, this time draw your pelvic floor muscles up as if you were attempting to stop the flow of urination. Be careful NOT to tense any other muscles. This can be hard, BE PATIENT. Try to hold up to 10 seconds repeating 10x. Try 2x a day. Once you feel you are doing this well, add this contraction to exercise #2. First contracting your pelvic floor followed by lower abdominals.   4. Adding leg movements. Add the following leg movements to challenge your ability to keep your core stable:  1. Single leg drop outs: Laying on your back with knees bent feet flat. Inhale,  dropping one knee outward KEEPING YOUR PELVIS STILL. Exhale as you bring the leg back, simultaneously performing your lower  abdominal contraction. Do 5-10 on each leg.   2. Marching: While keeping your pelvis still, lift the right foot a few inches, put it down then lift left foot. This will mimic a march. Start slow to establish control. Once you have control you may speed it up. Do 10-20x. You MUST keep your lower abdominlas contracted while you march. Breathe naturally    3. Single leg slides: Inhale while you slowly slide one leg out keeping your pelvis still. Only slide your leg as far as you can keep your pelvis still. Exhale as you bring the leg back to the start, contracting the lower abdominals as you do that. Keep your upper body relaxed. Do 5-10 on each side.         Abdominal Bracing With Pelvic Floor (Hook-Lying)    With neutral spine, tighten pelvic floor and abdominals. Repeat _5__ times. Do _1__ times a day.   Copyright  VHI. All rights reserved.    Bracing With Knee Fallout (Hook-Lying)    With neutral spine, tighten pelvic floor and abdominals and hold. Alternating legs, drop knee out to side. Keep opposite hip still. Repeat _10__ times. Do __1_ times a day.   Copyright  VHI. All rights reserved.  Bracing With Leg March (Hook-Lying)    With neutral spine, tighten pelvic floor and abdominals and hold. Alternating legs, lift foot _6__ inches and return to floor. Repeat _10__ times. Do __1_ times a day.   Copyright  VHI. All rights reserved.  Quick Contraction: Gravity Resisted (  Sitting)    Sitting, quickly squeeze then fully relax pelvic floor. Perform __1_ sets of __5_. Rest for _1__ seconds between sets. Do _4__ times a day.  Copyright  VHI. All rights reserved.  Slow Contraction: Gravity Resisted (Sitting)    Sitting, slowly squeeze pelvic floor for _10__ seconds. Rest for __5_ seconds. Repeat __10_ times. Do __4_ times a day.  Copyright  VHI. All rights reserved.   Sit to Stand: Phase 3    Sitting, squeeze pelvic floor and hold. Lean trunk forward. Push  up on arms and stand up. Relax. When getting up from a chair.   [facility] [address] [place], [place] [address] Phone # [phone] Fax [phone]

## 2017-06-23 ENCOUNTER — Encounter: Payer: Self-pay | Admitting: Gynecologic Oncology

## 2017-06-23 ENCOUNTER — Inpatient Hospital Stay: Payer: PPO | Attending: Gynecologic Oncology | Admitting: Gynecologic Oncology

## 2017-06-23 VITALS — BP 154/77 | HR 74 | Temp 98.2°F | Resp 20 | Wt 137.0 lb

## 2017-06-23 DIAGNOSIS — Z717 Human immunodeficiency virus [HIV] counseling: Secondary | ICD-10-CM | POA: Diagnosis not present

## 2017-06-23 DIAGNOSIS — Z171 Estrogen receptor negative status [ER-]: Secondary | ICD-10-CM | POA: Insufficient documentation

## 2017-06-23 DIAGNOSIS — N890 Mild vaginal dysplasia: Secondary | ICD-10-CM | POA: Diagnosis not present

## 2017-06-23 DIAGNOSIS — Z853 Personal history of malignant neoplasm of breast: Secondary | ICD-10-CM | POA: Diagnosis not present

## 2017-06-23 DIAGNOSIS — C541 Malignant neoplasm of endometrium: Secondary | ICD-10-CM | POA: Diagnosis not present

## 2017-06-23 DIAGNOSIS — Z923 Personal history of irradiation: Secondary | ICD-10-CM | POA: Insufficient documentation

## 2017-06-23 DIAGNOSIS — Z9221 Personal history of antineoplastic chemotherapy: Secondary | ICD-10-CM | POA: Diagnosis not present

## 2017-06-23 DIAGNOSIS — Z90722 Acquired absence of ovaries, bilateral: Secondary | ICD-10-CM | POA: Insufficient documentation

## 2017-06-23 DIAGNOSIS — Z90710 Acquired absence of both cervix and uterus: Secondary | ICD-10-CM

## 2017-06-23 NOTE — Patient Instructions (Signed)
Follow up in October 2019 or sooner if needed. Plan to follow up with Dr. Quincy Simmonds in one year.  Please call our office in June or July to schedule for October with Dr. Skeet Latch at [phone].

## 2017-06-23 NOTE — Progress Notes (Signed)
GYN ONCOLOGY OFFICE VISIT    Beth Simon 77 y.o. female  CC:  Endometrial cancer surveillance, VAIN I  Assessment/Plan:  [name]  is a 77 y.o.  witith Stage IA grade 2 endomerial cancer with 30% myoinvasion and LVSI. Staged 03/21/14. Vaginal brachytherapy completed 05/29/2014.   NED  VAIN I Pap WNL +hrHPV Simon with Bx 06/03/2016 c/w VAIN I Options Observation and repap in 1 year Pap 06/16/2017 Neg hrHPV neg.   Follow-up in 6 months with Gyn Onc  Follow-up with Beth.Silva  March 2020  HPI: [name]  is a 77 y.o.   G1 P1 menopausal.  Noted irregular pelvic cramping after the death of her husband.  Vaginal ultrasound 01/10/2014 endometrial stripe 14.59m.  Endometrial mass with cystic changes noted 2x1 cm.  Atrophic ovaries.  Uterine size 6.46x4.21 cm.  EMB 01/10/2014 Complex atypical hyperplasia.  History notable for left breast cancer ER-PR-  treated with radical mastectomy and chemotherapy in 1990.  Subsequently underwent right simple mastectomy 2008 without reconstruction. Denies use of tamoxifen  On 03/21/14  she underwent a robotic hysterectomy, BSO. Frozen section revealed grade 1 cancer. Final pathology revealed a grade 2 lesion with LVSI present and she was staged at stage IA. She was recommended vaginal brachytherapy as adjuvant therapy to reduce risk for local recurrence.  Beth Simon vaginal brachytherapy completed on 05/29/14. Her treatments were without complication. She has been using her vaginal dilator since.   Genetic testing 03/2014 negative FINAL DIAGNOSIS Diagnosis - INVASIVE ENDOMETRIOID CARCINOMA, FIGO GRADE II, LIMITED TO THE INNER HALF OF THE MYOMETRIUM WITH ANGIOLYMPHATIC INVASION. ADJACENT ENDOMETRIAL POLYP. - LEIOMYOMATA. - CERVIX: BENIGN SQUAMOUS MUCOSA AND ENDOCERVICAL MUCOSA. NO DYSPLASIA OR MALIGNANCY. - BILATERAL OVARIES: BENIGN OVARIAN TISSUE WITH ENDOSALPINGIOSIS, NO ATYPIA OR MALIGNANCY. - BILATERAL FALLOPIAN  TUBES: BENIGN FALLOPIAN TUBAL TISSUE, NO ATYPIA OR MALIGNANCY.  Specimen: Uterus, cervix, bilateral ovaries and fallopian tubes. Procedure: Total hysterectomy and bilateral salpingo-oophorectomy. Lymph node sampling performed: No. Specimen integrity: Intact. Maximum tumor size: 3.0 cm. Histologic type: Invasive endometrioid adenocarcinoma. Grade: FIGO Grade II. Myometrial invasion: 0.4 cm where myometrium is 1.2 cm in thickness. (30%) Cervical stromal involvement: No. Extent of involvement of other organs: No. Lymph - vascular invasion: Present. Peritoneal washings: N/A. Lymph nodes: # examined N/A ; # positive N/A.  Subsequent genetic testing on the following 25 genes: APC, ATM, BARD1, BMPR1A, BRCA1, BRCA2, BRIP1, CHD1, CDK4, CDKN2A, CHEK2, EPCAM (large rearrangement only), MLH1, MSH2, MSH6, MUTYH, NBN, PALB2, PMS2, PTEN, RAD51C, RAD51D, SMAD4, STK11, and TP53 negative Interval history: Beth Simon.     Pap 05/2016 wnl Pap  05/2017 wnl +hrHPV Simon and bx 06/03/2016 VAIN I.  Beth Simon. Opted for surveillance Pap 05/2017 neg hrHPV neg   Social Hx:   Social History   Socioeconomic History  . Marital status: Widowed    Spouse name: Not on file  . Number of children: 1  . Years of education: Not on file  . Highest education level: Not on file  Occupational History  . Not on file  Social Needs  . Financial resource strain: Not on file  . Food insecurity:    Worry: Not on file    Inability: Not on file  . Transportation needs:    Medical: Not on file    Non-medical: Not on file  Tobacco Use  . Smoking status: Never Smoker  . Smokeless tobacco: Never Used  Substance and  Sexual Activity  . Alcohol use: No  . Drug use: No  . Sexual activity: Never    Partners: Male  Lifestyle  . Physical activity:    Days per week: Not on file    Minutes per session: Not on file  . Stress: Not on file  Relationships  . Social  connections:    Talks on phone: Not on file    Gets together: Not on file    Attends religious service: Not on file    Active member of club or organization: Not on file    Attends meetings of clubs or organizations: Not on file    Relationship status: Not on file  . Intimate partner violence:    Fear of current or ex partner: Not on file    Emotionally abused: Not on file    Physically abused: Not on file    Forced sexual activity: Not on file  Other Topics Concern  . Not on file  Social History Narrative  . Not on file  Husband died 2013/08/30.   Earlier this year felt that she is coping emotionally with this loss much better and is engaged in more activities socially  Past Surgical History:  Procedure Laterality Date  . BREAST IMPLANT REMOVAL  6/94   left breast  . BREAST RECONSTRUCTION  1990   left  . CATARACT EXTRACTION Bilateral   . EXCISION VAGINAL CYST    . MASTECTOMY Right 10/08/06   prophylactic  . RADICAL MASTECTOMY LND  1990   left, chemo done  . ROBOTIC ASSISTED TOTAL HYSTERECTOMY WITH BILATERAL SALPINGO OOPHERECTOMY Bilateral 03/21/2014   Procedure: ROBOTIC ASSISTED TOTAL HYSTERECTOMY WITH BILATERAL SALPINGO OOPHORECTOMY ;  Surgeon: Janie Morning, MD;  Location: [facility];  Service: Gynecology;  Laterality: Bilateral;     Past Medical Hx:  Past Medical History:  Diagnosis Date  . Adenomatous polyps 01/2016  . Anemia   . Breast cancer ([place]) 1990   bilateral mastectomy, adenoca breast-left MRM, reconstruction, chemo  . Bronchitis last 2 weeks   saw Beth Felipa Eth 02-28-2014, he said no antibiotics needed, nonproductive cough  . Complication of anesthesia   . Cystitis    cytoxen, had once or twice  . Family history of breast cancer   . History of breast cancer   . History of radiation therapy 2/10, 2/12, 2/18, 2/24, 05/29/14   vaginal cuff/ 30 Gy/ 5 fx  . Macular degeneration   . Neck pain    taking physictal therapy last 2 weeks  . Osteoporosis    on Evista  .  PONV (postoperative nausea and vomiting)   . Shoulder pain    taking physical therapy for last few weeks  . Uterine cancer ([place]) 03/21/2014   MLH1/PMS2 LOH  . Uterine fibroid   . VAIN I (vaginal intraepithelial neoplasia grade I) 2018   positive HR HPV.  . Vasovagal syncopes     Past Gynecological History: G1P1 Menarche 19 . Long history of irregular and heavy mesnses.  Never used birth control. no h/o abnormal pap test Patient's last menstrual period was 03/31/1988 (approximate). 2 sexual partners.  Her second husband was married three times.    Family Hx:  Family History  Problem Relation Age of Onset  . Breast cancer Sister 64       recurrence age 14  . Breast cancer Paternal Grandmother   . Heart disease Mother   . Heart disease Father   . Hypertension Sister   . Dementia Sister   .  Breast cancer Maternal Aunt 68  . Dementia Maternal Aunt        dx in her 3s  Maternal aunt breast cancer dx 68y/o Paternal grandmother breast cancer age at dx ?  REVIEW OF SYSTEMS Constitutional  Feels Simon Cardiovascular  No chest pain, shortness of breath, or edema,  Pulmonary  No cough or wheeze.  Gastro Intestinal  No nausea, vomitting, or diarrhoea. No bright red blood per rectum, no abdominal pain, change in bowel movement, or constipation.  Occasional fecal incontinence that occurs sometimes while sleeping Genito Urinary  No frequency, urgency, dysuria, vaginal soreness, no vaginal bleeding Musculo Skeletal  No myalgia, arthralgia, joint swelling or pain  Neurologic  No weakness, numbness, change in gait,  Psychology  No depression, anxiety, insomnia.    PHYSICAL EXAMINATION Vitals:  BP (!) 154/77 (BP Location: Right Arm, Patient Position: Sitting)   Pulse 74   Temp 98.2 F (36.8 C) (Oral)   Resp 20   Wt 137 lb (62.1 kg)   LMP 03/31/1988 (Approximate)   SpO2 99%   BMI 27.67 kg/m   Wt Readings from Last 3 Encounters:  06/23/17 137 lb (62.1 kg)  06/03/17 135 lb  (61.2 kg)  10/10/16 139 lb 9.6 oz (63.3 kg)   Neck  Supple without any enlargements.  Skin  No rash/lesions/breakdown  Psychiatry  Alert and oriented to person, place, and time  Abdomen  Normoactive bowel sounds, abdomen soft, non-tender and non obese. Surgical sites Simon healed. No masses or tenderness at the port sites Genito Urinary  Vulva/vagina: Normal external female genitalia.  No lesions. Markedly atrophic vagina.  No discharge or bleeding.   No pelvic tenderness  Bladder/urethra:  No lesions or masses Rectal: Fair tone no masses Extremities  No bilateral cyanosis, clubbing or edema. No rash, lesions or petiche.

## 2017-06-29 ENCOUNTER — Ambulatory Visit: Payer: PPO | Admitting: Podiatry

## 2017-07-06 ENCOUNTER — Ambulatory Visit: Payer: PPO | Attending: Obstetrics and Gynecology | Admitting: Physical Therapy

## 2017-07-06 ENCOUNTER — Encounter: Payer: Self-pay | Admitting: Physical Therapy

## 2017-07-06 DIAGNOSIS — R278 Other lack of coordination: Secondary | ICD-10-CM | POA: Diagnosis not present

## 2017-07-06 DIAGNOSIS — M6281 Muscle weakness (generalized): Secondary | ICD-10-CM | POA: Insufficient documentation

## 2017-07-06 NOTE — Therapy (Signed)
[facility] [address] [place], [place], [address] Phone: [phone]   Fax:  [phone]  Physical Therapy Treatment  Patient Details  Name: Beth Simon MRN: [id number] Date of Birth: [birth year] Referring Provider: Dr. Josefa Half   Encounter Date: 07/06/2017  PT End of Session - 07/06/17 1221    Visit Number  3    Date for PT Re-Evaluation  08/04/17    PT Start Time  1145    PT Stop Time  1221    PT Time Calculation (min)  36 min    Activity Tolerance  Patient tolerated treatment well    Behavior During Therapy  [facility] for tasks assessed/performed       Past Medical History:  Diagnosis Date  . Adenomatous polyps 01/2016  . Anemia   . Breast cancer ([place]) 1990   bilateral mastectomy, adenoca breast-left MRM, reconstruction, chemo  . Bronchitis last 2 weeks   saw dr Felipa Eth 02-28-2014, he said no antibiotics needed, nonproductive cough  . Complication of anesthesia   . Cystitis    cytoxen, had once or twice  . Family history of breast cancer   . History of breast cancer   . History of radiation therapy 2/10, 2/12, 2/18, 2/24, 05/29/14   vaginal cuff/ 30 Gy/ 5 fx  . Macular degeneration   . Neck pain    taking physictal therapy last 2 weeks  . Osteoporosis    on Evista  . PONV (postoperative nausea and vomiting)   . Shoulder pain    taking physical therapy for last few weeks  . Uterine cancer ([place]) 03/21/2014   MLH1/PMS2 LOH  . Uterine fibroid   . VAIN I (vaginal intraepithelial neoplasia grade I) 2018   positive HR HPV.  . Vasovagal syncopes     Past Surgical History:  Procedure Laterality Date  . BREAST IMPLANT REMOVAL  6/94   left breast  . BREAST RECONSTRUCTION  1990   left  . CATARACT EXTRACTION Bilateral   . EXCISION VAGINAL CYST    . MASTECTOMY Right 10/08/06   prophylactic  . RADICAL MASTECTOMY LND  1990   left, chemo done  . ROBOTIC ASSISTED TOTAL HYSTERECTOMY WITH BILATERAL  SALPINGO OOPHERECTOMY Bilateral 03/21/2014   Procedure: ROBOTIC ASSISTED TOTAL HYSTERECTOMY WITH BILATERAL SALPINGO OOPHORECTOMY ;  Surgeon: Janie Morning, MD;  Location: [facility];  Service: Gynecology;  Laterality: Bilateral;    There were no vitals filed for this visit.  Subjective Assessment - 07/06/17 1153    Subjective  rectal pain is 70% better.     Pertinent History  Breast cancer; osteoporosis; endometrial cancer    Patient Stated Goals  improve the leakage    Currently in Pain?  Yes    Pain Score  4     Pain Location  Rectum    Pain Orientation  Mid    Pain Descriptors / Indicators  Pressure    Pain Type  Acute pain    Pain Onset  More than a month ago    Pain Frequency  Intermittent    Aggravating Factors   Holding a bowel movement prior to going to the commode    Pain Relieving Factors  bowel movement    Multiple Pain Sites  No         [facility] PT Assessment - 07/06/17 0001      Assessment   Medical Diagnosis  R15.9 Incontinence of feces, unspecified fecal incontinence; N39.3 stress incontinence    Referring  Provider  Dr. Josefa Half    Onset Date/Surgical Date  06/09/16    Prior Therapy  urinary incontinence      Precautions   Precautions  Other (comment)    Precaution Comments  breast cancer; osteoporosis      Restrictions   Weight Bearing Restrictions  No      Balance Screen   Has the patient fallen in the past 6 months  No    Has the patient had a decrease in activity level because of a fear of falling?   No    Is the patient reluctant to leave their home because of a fear of falling?   No      Home Film/video editor residence      Prior Function   Level of Independence  Independent    Vocation  Retired      Associate Professor   Overall Cognitive Status  Within Functional Limits for tasks assessed      Observation/Other Assessments   Focus on Therapeutic Outcomes (FOTO)   0% limitation with fecal leakage      Posture/Postural Control    Posture/Postural Control  Postural limitations    Postural Limitations  Forward head;Rounded Shoulders;Increased thoracic kyphosis    Posture Comments  cervical thoracic kyphosis has decreased by 50%      Strength   Right Hip Extension  5/5    Right Hip ABduction  4+/5    Left Hip ABduction  4+/5      Transfers   Transfers  Not assessed                Pelvic Floor Special Questions - 07/06/17 0001    Urinary urgency  No    Pelvic Floor Internal Exam  Patient confirms identification and approves PT to assess pelvic floor muscles and treatment.     Exam Type  Rectal    Sensation  able to feel therapist finger in the canal    Palpation  the puborectalis muscle does not come forward with contraction and patient holds breath    Strength  good squeeze, good lift, able to hold agaisnt strong resistance        [facility] Adult PT Treatment/Exercise - 07/06/17 0001      Self-Care   Self-Care  Other Self-Care Comments    Other Self-Care Comments   trim toenails without her stomach being in the way and educated her to do in sittng due to bieng safer than on one leg.       Lumbar Exercises: Aerobic   Stationary Bike  level 2 x 6 min             PT Education - 07/06/17 1213    Education provided  Yes    Education Details  pelvic floor contraction in sitting with hip ER and yellow band, pelvic floor contraction in standing, how to clip toenails in sitting    Person(s) Educated  Patient    Methods  Explanation;Demonstration;Handout    Comprehension  Verbalized understanding;Returned demonstration       PT Short Term Goals - 06/22/17 1229      PT SHORT TERM GOAL #1   Title  be independent in initial HEP    Time  4    Period  Weeks    Status  Achieved      PT SHORT TERM GOAL #2   Title  fecal leakage decreased >/= 25% due to improve strength and coordination  Time  4    Period  Weeks    Status  Achieved        PT Long Term Goals - 07/06/17 1159      PT  LONG TERM GOAL #1   Title  be independent in advanced HEP    Time  8    Period  Weeks    Status  Achieved      PT LONG TERM GOAL #2   Title  fecal leakage decreased >/= 75% due to improved strength and endurance     Baseline   no leakage during sleep and when awake    Time  8    Period  Weeks    Status  Achieved      PT LONG TERM GOAL #3   Title  ability to feel the leakage if it happens due to increased sensation of the anal sphincter    Time  8    Period  Weeks    Status  Achieved            Plan - 07/06/17 1222    Clinical Impression Statement  Patient has met her goals.  She is not having any fecal leakage and no pain when she is having the urge to have a bowel movement.  Patient is able to get to the toilet in time when she has the urge to have a bowel movement.  Patient is independent with her HEP and understands how to progress herself.      Rehab Potential  Excellent    Clinical Impairments Affecting Rehab Potential  breast cancer; ovarian cancer with radiation; osteoporosis; masectomy; macular degeneration    PT Treatment/Interventions  Biofeedback;Therapeutic activities;Therapeutic exercise;Patient/family education;Neuromuscular re-education;Manual techniques    PT Next Visit Plan  Discharge to HEP this visit    PT Home Exercise Plan  Current HEP    Consulted and Agree with Plan of Care  Patient       Patient will benefit from skilled therapeutic intervention in order to improve the following deficits and impairments:  Decreased coordination, Decreased endurance, Decreased activity tolerance, Decreased strength  Visit Diagnosis: Muscle weakness (generalized)  Other lack of coordination     Problem List Patient Active Problem List   Diagnosis Date Noted  . VAIN I (vaginal intraepithelial neoplasia grade I) 06/23/2017  . Genetic testing 05/23/2014  . History of breast cancer   . Family history of breast cancer   . Endometrial cancer ([place]) 03/21/2014  .  Uterine cancer ([address]) 03/21/2014  . S/P mastectomy, bilateral 05/02/2013  . Situational stress 05/02/2013  . Osteoporosis, unspecified 05/02/2013  . Breast cancer, left breast ([place]) 05/02/2013    Earlie Counts, PT 07/06/17 12:27 PM   [facility] [address] [place], [place], [address] Phone: [phone]   Fax:  [phone]  Name: Beth Simon MRN: [id number] Date of Birth: 08/23/1940  PHYSICAL THERAPY DISCHARGE SUMMARY  Visits from Start of Care: 3  Current functional level related to goals / functional outcomes: See above.    Remaining deficits: See above.    Education / Equipment: HEP Plan: Patient agrees to discharge.  Patient goals were met. Patient is being discharged due to meeting the stated rehab goals.  Thank you for the referral Earlie Counts, PT 07/06/17 12:26 PM  ?????

## 2017-07-06 NOTE — Patient Instructions (Addendum)
Quick Contraction: Gravity Resisted (Standing)    Standing, quickly squeeze then fully relax pelvic floor. Perform __1_ sets of _5__. Rest for _1__ seconds between sets. Do _3__ times a day.  Copyright  VHI. All rights reserved.  Slow Contraction: Gravity Resisted (Standing)    Standing, slowly squeeze pelvic floor for _5__ seconds. Rest for _5__ seconds. Repeat _10__ times. Do __3_ times a day. Feel lower abdominal tense up while contracting the pelvic floor.  Copyright  VHI. All rights reserved.  External Rotation: Hip - Knees Apart With Pelvic Floor (Sitting)    Sit, band tied just above knees. Squeeze pelvic floor while pulling knees apart. Hold for __5_ seconds. Rest for _5__ seconds. Repeat _20__ times. Do __1_ times a day.  Copyright  VHI. All rights reserved.  [facility] [address] [place], [place] [address] Phone # [phone] Fax [phone]

## 2017-07-21 ENCOUNTER — Ambulatory Visit: Payer: PPO | Admitting: Podiatry

## 2017-07-21 DIAGNOSIS — B351 Tinea unguium: Secondary | ICD-10-CM | POA: Diagnosis not present

## 2017-07-21 DIAGNOSIS — Q828 Other specified congenital malformations of skin: Secondary | ICD-10-CM | POA: Diagnosis not present

## 2017-07-21 DIAGNOSIS — M79676 Pain in unspecified toe(s): Secondary | ICD-10-CM | POA: Diagnosis not present

## 2017-07-22 NOTE — Progress Notes (Signed)
Patient ID: Beth Simon, female   DOB: 01/09/1941, 77 y.o.   MRN: [id number]  Subjective: 77 y.o. returns the office today for painful, elongated, thickened toenails which she is unable to trim herself. Denies any redness or drainage around the nails. Denies any swelling redness or drainage to the nail sites. She also has calluses to both of her feet that she would like to have trimmed as they are very painful at times for her.  Denies any acute changes since last appointment and no new complaints today. Denies any systemic complaints such as fevers, chills, nausea, vomiting.   Objective: AAO 3, NAD DP/PT pulses palpable, CRT less than 3 seconds Nails hypertrophic, dystrophic, elongated, brittle, discolored 10. There is tenderness overlying the nails 1-5 bilaterally. There is no surrounding erythema or drainage along the nail sites. Hyperkeratotic lesions of the distal aspect left second digit. Upon debridement there is no underlying ulceration, drainage or other signs of infection.  Minimal hyperkeratotic lesion otherwise No open lesions or pre-ulcerative lesions are identified. No pain with calf compression, swelling, warmth, erythema.  Assessment: Patient presents with symptomatic onychomycosis; hyperkeratotic lesions  Plan: -Treatment options including alternatives, risks, complications were discussed -Nails sharply debrided 10 without complication/bleeding. -Hyperkeratotic lesion sharply debrided 1 without complication/bleeding. -Discussed daily foot inspection. If there are any changes, to call the office immediately.  -Follow-up in 6 weeks at her request or sooner if any problems are to arise. In the meantime, encouraged to call the office with any questions, concerns, changes symptoms.  Celesta Gentile, DPM

## 2017-08-10 DIAGNOSIS — I10 Essential (primary) hypertension: Secondary | ICD-10-CM | POA: Diagnosis not present

## 2017-08-13 DIAGNOSIS — H26491 Other secondary cataract, right eye: Secondary | ICD-10-CM | POA: Diagnosis not present

## 2017-08-13 DIAGNOSIS — H524 Presbyopia: Secondary | ICD-10-CM | POA: Diagnosis not present

## 2017-08-13 DIAGNOSIS — Z961 Presence of intraocular lens: Secondary | ICD-10-CM | POA: Diagnosis not present

## 2017-08-25 DIAGNOSIS — I10 Essential (primary) hypertension: Secondary | ICD-10-CM | POA: Diagnosis not present

## 2017-09-01 ENCOUNTER — Ambulatory Visit: Payer: PPO | Admitting: Podiatry

## 2017-09-08 DIAGNOSIS — R5383 Other fatigue: Secondary | ICD-10-CM | POA: Diagnosis not present

## 2017-09-08 DIAGNOSIS — M810 Age-related osteoporosis without current pathological fracture: Secondary | ICD-10-CM | POA: Diagnosis not present

## 2017-09-08 DIAGNOSIS — E559 Vitamin D deficiency, unspecified: Secondary | ICD-10-CM | POA: Diagnosis not present

## 2017-09-15 ENCOUNTER — Other Ambulatory Visit: Payer: Self-pay

## 2017-09-15 ENCOUNTER — Encounter: Payer: Self-pay | Admitting: Podiatry

## 2017-09-15 ENCOUNTER — Ambulatory Visit: Payer: PPO | Admitting: Podiatry

## 2017-09-15 DIAGNOSIS — M79676 Pain in unspecified toe(s): Secondary | ICD-10-CM | POA: Diagnosis not present

## 2017-09-15 DIAGNOSIS — Q828 Other specified congenital malformations of skin: Secondary | ICD-10-CM | POA: Diagnosis not present

## 2017-09-15 DIAGNOSIS — K219 Gastro-esophageal reflux disease without esophagitis: Secondary | ICD-10-CM | POA: Insufficient documentation

## 2017-09-15 DIAGNOSIS — I10 Essential (primary) hypertension: Secondary | ICD-10-CM | POA: Insufficient documentation

## 2017-09-15 DIAGNOSIS — E7800 Pure hypercholesterolemia, unspecified: Secondary | ICD-10-CM | POA: Insufficient documentation

## 2017-09-15 DIAGNOSIS — G459 Transient cerebral ischemic attack, unspecified: Secondary | ICD-10-CM | POA: Insufficient documentation

## 2017-09-15 DIAGNOSIS — B351 Tinea unguium: Secondary | ICD-10-CM

## 2017-09-15 DIAGNOSIS — R911 Solitary pulmonary nodule: Secondary | ICD-10-CM | POA: Insufficient documentation

## 2017-09-16 DIAGNOSIS — K219 Gastro-esophageal reflux disease without esophagitis: Secondary | ICD-10-CM | POA: Diagnosis not present

## 2017-09-16 DIAGNOSIS — E041 Nontoxic single thyroid nodule: Secondary | ICD-10-CM | POA: Diagnosis not present

## 2017-09-16 DIAGNOSIS — Z79899 Other long term (current) drug therapy: Secondary | ICD-10-CM | POA: Diagnosis not present

## 2017-09-16 DIAGNOSIS — Z0000 Encounter for general adult medical examination without abnormal findings: Secondary | ICD-10-CM | POA: Diagnosis not present

## 2017-09-16 DIAGNOSIS — I10 Essential (primary) hypertension: Secondary | ICD-10-CM | POA: Diagnosis not present

## 2017-09-16 DIAGNOSIS — Z1389 Encounter for screening for other disorder: Secondary | ICD-10-CM | POA: Diagnosis not present

## 2017-09-21 ENCOUNTER — Other Ambulatory Visit: Payer: Self-pay | Admitting: Geriatric Medicine

## 2017-09-21 ENCOUNTER — Telehealth: Payer: Self-pay | Admitting: *Deleted

## 2017-09-21 DIAGNOSIS — E041 Nontoxic single thyroid nodule: Secondary | ICD-10-CM

## 2017-09-21 NOTE — Progress Notes (Signed)
Patient ID: Beth Simon, female   DOB: [birth year], 77 y.o.   MRN: [id number]  Subjective: 77 y.o. returns the office today for painful, elongated, thickened toenails which she is unable to trim herself. Denies any redness or drainage around the nails. Denies any swelling redness or drainage to the nail sites. Denies any acute changes since last appointment and no new complaints today. Denies any systemic complaints such as fevers, chills, nausea, vomiting.   Objective: AAO 3, NAD DP/PT pulses palpable, CRT less than 3 seconds Nails hypertrophic, dystrophic, elongated, brittle, discolored 10. There is tenderness overlying the nails 1-5 bilaterally. There is no surrounding erythema or drainage along the nail sites. Hyperkeratotic lesions of the distal aspect left second digit. Upon debridement there is no underlying ulceration, drainage or other signs of infection.  Minimal hyperkeratotic lesion otherwise No open lesions or pre-ulcerative lesions are identified. No pain with calf compression, swelling, warmth, erythema.  Assessment: Patient presents with symptomatic onychomycosis; hyperkeratotic lesions  Plan: -Treatment options including alternatives, risks, complications were discussed -Nails sharply debrided 10 without complication/bleeding. -Hyperkeratotic lesion sharply debrided 1 without complication/bleeding. -Discussed daily foot inspection. If there are any changes, to call the office immediately.  -Follow-up in 61 days  at her request or sooner if any problems are to arise. In the meantime, encouraged to call the office with any questions, concerns, changes symptoms.  Celesta Gentile, DPM

## 2017-09-21 NOTE — Telephone Encounter (Signed)
Patient called and scheduled her follow up appt for October 3rd

## 2017-09-22 DIAGNOSIS — M810 Age-related osteoporosis without current pathological fracture: Secondary | ICD-10-CM | POA: Diagnosis not present

## 2017-09-22 DIAGNOSIS — M25511 Pain in right shoulder: Secondary | ICD-10-CM | POA: Diagnosis not present

## 2017-10-05 ENCOUNTER — Ambulatory Visit
Admission: RE | Admit: 2017-10-05 | Discharge: 2017-10-05 | Disposition: A | Discharge status: Home / Self Care | Payer: PPO | Source: Ambulatory Visit | Attending: Geriatric Medicine | Admitting: Geriatric Medicine

## 2017-10-05 DIAGNOSIS — E042 Nontoxic multinodular goiter: Secondary | ICD-10-CM | POA: Diagnosis not present

## 2017-10-05 DIAGNOSIS — E041 Nontoxic single thyroid nodule: Secondary | ICD-10-CM

## 2017-10-12 DIAGNOSIS — I10 Essential (primary) hypertension: Secondary | ICD-10-CM | POA: Diagnosis not present

## 2017-10-12 DIAGNOSIS — Z79899 Other long term (current) drug therapy: Secondary | ICD-10-CM | POA: Diagnosis not present

## 2017-10-13 NOTE — Progress Notes (Signed)
Beth Simon presents for follow up of radiation received 05/10/14, 05/12/14, 05/18/14, 05/24/14, 05/29/14 to her vaginal cuff.  She saw Dr. Skeet Latch on 06/23/17 and she documents: Pap 05/2016 wnl Pap  05/2017 wnl +hrHPV Colposcopy and bx 06/03/2016 VAIN I.  [name] is sore from the colposcopy. Opted for surveillance Pap 05/2017 neg hrHPV neg (performed by Gynecologist)  She will see Dr. Skeet Latch again on 12/31/17 for 6 month follow up. She reports feeling better over the past few months after the loss of her husband 4 years ago, she is doing activities out of the house more frequently.  She denies pain. She has seen PT for pelvic exercise, and reports some improvement. She does report occasional fecal incontinence which does not give her "any warning". She denies urinary or vaginal discharge. She is using her vaginal dilator about once monthly. She has been told by Dr. Skeet Latch to use it once weekly.    BP (!) 143/62   Pulse 66   Temp 98.2 F (36.8 C)   Resp 18   Ht 4\' 11"  (1.499 m)   Wt 136 lb 9.6 oz (62 kg)   LMP 03/31/1988 (Approximate)   SpO2 94% Comment: room air  BMI 27.59 kg/m    Wt Readings from Last 3 Encounters:  10/16/17 136 lb 9.6 oz (62 kg)  06/23/17 137 lb (62.1 kg)  06/03/17 135 lb (61.2 kg)

## 2017-10-16 ENCOUNTER — Ambulatory Visit
Admission: RE | Admit: 2017-10-16 | Discharge: 2017-10-16 | Disposition: A | Discharge status: Home / Self Care | Payer: PPO | Source: Ambulatory Visit | Attending: Radiation Oncology | Admitting: Radiation Oncology

## 2017-10-16 ENCOUNTER — Encounter: Payer: Self-pay | Admitting: Radiation Oncology

## 2017-10-16 ENCOUNTER — Other Ambulatory Visit: Payer: Self-pay

## 2017-10-16 ENCOUNTER — Telehealth: Payer: Self-pay | Admitting: *Deleted

## 2017-10-16 VITALS — BP 143/62 | HR 66 | Temp 98.2°F | Resp 18 | Ht 59.0 in | Wt 136.6 lb

## 2017-10-16 DIAGNOSIS — R159 Full incontinence of feces: Secondary | ICD-10-CM | POA: Diagnosis not present

## 2017-10-16 DIAGNOSIS — Z08 Encounter for follow-up examination after completed treatment for malignant neoplasm: Secondary | ICD-10-CM | POA: Diagnosis not present

## 2017-10-16 DIAGNOSIS — Z79899 Other long term (current) drug therapy: Secondary | ICD-10-CM | POA: Diagnosis not present

## 2017-10-16 DIAGNOSIS — C541 Malignant neoplasm of endometrium: Secondary | ICD-10-CM | POA: Diagnosis not present

## 2017-10-16 DIAGNOSIS — Z888 Allergy status to other drugs, medicaments and biological substances status: Secondary | ICD-10-CM | POA: Diagnosis not present

## 2017-10-16 DIAGNOSIS — Z8542 Personal history of malignant neoplasm of other parts of uterus: Secondary | ICD-10-CM | POA: Diagnosis not present

## 2017-10-16 HISTORY — DX: Essential (primary) hypertension: I10

## 2017-10-16 NOTE — Telephone Encounter (Signed)
Called patient to inform of 1 yr. fu on 10-22-2018 @ 2 pm, spoke with patient and she is aware of this appt.

## 2017-10-16 NOTE — Progress Notes (Signed)
Radiation Oncology         [phone] ________________________________  Name: Beth Simon MRN: [id number]  Date: 10/16/2017  DOB: 1940-09-11  Follow-Up Visit Note  Outpatient  CC: Beth Manes, MD  Beth Marus, MD  Diagnosis and Prior Radiotherapy:    ICD-10-CM   1. Endometrial cancer ([place]) C54.1    FIGO Stage IA;  pT1a, pNX  Radiation treatment dates:   05/10/2014, 05/12/2014, 05/18/2014, 05/24/2014, 05/29/2014 Site/dose:   Vaginal Cuff / 30 Gy in 5 fractions  Narrative: [name] is here for follow-up of brachytherapy/ HDR for endometrial cancer from 05/10/14 - 05/29/14.  She last saw Dr. Skeet Simon on 06/23/2017. She documents: "Pap 05/2017 neg hrHPV neg". She will see Dr. Skeet Simon again on 12/31/2017 for 6 month follow-up.  March 2019 Pap smear: negative for intraepithelial lesions or malignancy. Benign reactive/reparative changes.  On review of systems, the patient reports feeling better over the past few months after the loss of her husband 4 years ago. She is doing activities out of the house more frequently. She denies pain. She has seen PT for pelvic exercise and reports some improvement. She does report occasional fecal incontinence which does not give her "any warning". She denies urinary or vaginal discharge. She is using her vaginal dilator about once monthly. She has been told by Dr. Skeet Simon to use it once weekly.    ALLERGIES:  is allergic to prednisone and novocain [procaine].  Meds: Current Outpatient Medications  Medication Sig Dispense Refill  . Coenzyme Q10 (COQ10 PO) Take 1 tablet by mouth every morning.     . Cyanocobalamin (VITAMIN B 12 PO) Take 1 tablet by mouth every morning.     . denosumab (PROLIA) 60 MG/ML SOLN injection Inject 60 mg into the skin every 6 (six) months. Administer in upper arm, thigh, or abdomen    . Flaxseed, Linseed, (FLAX SEEDS PO) Take 1 scoop by mouth every morning. Ground flaxseed in cereal.    . lisinopril (PRINIVIL,ZESTRIL) 5  MG tablet Take 5 mg by mouth daily.    Marland Kitchen MELATONIN PO Take 1 tablet by mouth at bedtime as needed (sleep.).     Marland Kitchen Omega-3 Fatty Acids (FISH OIL PO) Take 1 capsule by mouth every morning.     Marland Kitchen OVER THE COUNTER MEDICATION Take 1 tablet by mouth every morning. Astazanthin. (anti-oxidant)    . VITAMIN D, CHOLECALCIFEROL, PO Take 4,000 Int'l Units by mouth every morning.      No current facility-administered medications for this encounter.     Physical Findings: The patient is in no acute distress. Patient is alert and oriented.  height is 4\' 11"  (1.499 m) and weight is 136 lb 9.6 oz (62 kg). Her temperature is 98.2 F (36.8 C). Her blood pressure is 143/62 (abnormal) and her pulse is 66. Her respiration is 18 and oxygen saturation is 94%.    Heart: Regular in rate and rhythm, no murmurs.  Chest: Clear to ascultation bilaterally.  Abdomen: Soft, non tender, non distended. Extremities: She has swelling in her ankles, slightly more in the left. Stable. Lymph: Groin without palpable masses.  Psych: Affect appropriate, judgment intact. GYN: External genitalia are normal appearing. Digital and speculum exam show no signs of recurrence. She also has some adhesions in the vagina. Digital rectal examination reveals no anal or rectal masses, no fistula, and no external anal lesions are appreciated.      Lab Findings: Lab Results  Component Value Date   WBC 17.0 (H) 03/22/2014  HGB 12.1 03/22/2014   HCT 37.3 03/22/2014   MCV 94.9 03/22/2014   PLT 186 03/22/2014     CMP     Component Value Date/Time   NA 136 03/22/2014 0412   K 4.1 03/22/2014 0412   CL 109 03/22/2014 0412   CO2 22 03/22/2014 0412   GLUCOSE 112 (H) 03/22/2014 0412   BUN 9 03/22/2014 0412   CREATININE 0.63 03/22/2014 0412   CALCIUM 8.0 (L) 03/22/2014 0412   PROT 7.6 03/01/2014 1040   ALBUMIN 3.7 03/01/2014 1040   AST 22 03/01/2014 1040   ALT 20 03/01/2014 1040   ALKPHOS 53 03/01/2014 1040   BILITOT <0.2 (L)  03/01/2014 1040   GFRNONAA 86 (L) 03/22/2014 0412   GFRAA >90 03/22/2014 0412    Radiographic Findings: US Thyroid  Result Date: 10/05/2017 CLINICAL DATA:  Prior ultrasound follow-up. Follow-up multinodular goiter EXAM: THYROID ULTRASOUND TECHNIQUE: Ultrasound examination of the thyroid gland and adjacent soft tissues was performed. COMPARISON:  07/29/2016; 01/04/2013 FINDINGS: Parenchymal Echotexture: Mildly heterogenous Isthmus: Normal in size measures 0.4 cm in diameter, unchanged Right lobe: Slightly diminutive in size measuring 3.6 x 1.5 x 1.2 cm, unchanged, previously, 3.9 x 1.7 x 1.5 cm Left lobe: Slightly diminutive in size measuring 3.7 x 1.5 x 1.0 cm, unchanged, previously, 3.9 x 1.6 x 1.1 cm _________________________________________________________ Estimated total number of nodules >/= 1 cm: 1 Number of spongiform nodules >/=  2 cm not described below (TR1): 0 Number of mixed cystic and solid nodules >/= 1.5 cm not described below ([place]): 0 _________________________________________________________ There is an approximately 0.6 x 0.6 x 0.4 cm anechoic cyst within the mid aspect of the right lobe of the thyroid (labeled 1) which is unchanged to decreased in size compared to the 12/2012 examination and is again noted to contain internal echogenic foci with ring down artifact compatible with benign colloid. This cyst does not meet imaging criteria to recommend percutaneous sampling or continued dedicated follow-up. There is an approximately 1.3 x 0.7 x 0.7 cm partially solid though prominently cystic nodule within the inferior pole the left lobe of the thyroid (labeled 2) which is grossly unchanged compared to the 12/2012 examination, previously, 1.0 cm with slight differences attributable to interval partial cystic degeneration, a typically benign finding. This probably cystic nodule does not meet imaging criteria to recommend percutaneous sampling or continued dedicated follow-up. There is an  approximately 0.7 cm anechoic cyst within the mid aspect of the left lobe of the thyroid (labeled 3) which is unchanged compared to the 12/2012 examination, previously, 0.8 cm. This cyst is again noted to contain an eccentric echogenic foci with ring down artifact compatible with benign colloid. This cyst does not meet imaging criteria to recommend percutaneous sampling or continued dedicated follow-up. IMPRESSION: 1. Similar findings of multinodular goiter. No worrisome new or enlarging thyroid nodules. 2. None of the discretely measured thyroid nodules/cysts, all of which appear grossly unchanged compared to the 12/2012 examination, meet imaging criteria to recommend percutaneous sampling or continued dedicated follow-up. The above is in keeping with the ACR TI-RADS recommendations - J Am Coll Radiol 2017;14:587-595. Electronically Signed   By: Sandi Mariscal M.D.   On: 10/05/2017 15:05    Impression/Plan:  No evidence of disease.  I will continue to monitor the patient for RT side effects and to perform routine speculum exams. I will see her again in 1 year. For PAP smears, I will defer to Gynecology and GynOnc to take precedence.   Patient advised to  increase use of dilator -adhesions noted.  I spent over 15 minutes face to face with the patient and more than 50% of that time was spent in counseling and/or coordination of care.  _____________________________________   [name], MD   This document serves as a record of services personally performed by [name], MD. It was created on her behalf by Arlyce Harman, a trained medical scribe. The creation of this record is based on the scribe's personal observations and the provider's statements to them. This document has been checked and approved by the attending provider.

## 2017-10-19 ENCOUNTER — Encounter: Payer: Self-pay | Admitting: Radiation Oncology

## 2017-11-26 ENCOUNTER — Ambulatory Visit: Payer: PPO | Admitting: Podiatry

## 2017-11-27 ENCOUNTER — Ambulatory Visit: Payer: PPO | Admitting: Podiatry

## 2017-11-27 ENCOUNTER — Encounter: Payer: Self-pay | Admitting: Podiatry

## 2017-11-27 DIAGNOSIS — M79676 Pain in unspecified toe(s): Secondary | ICD-10-CM

## 2017-11-27 DIAGNOSIS — Q828 Other specified congenital malformations of skin: Secondary | ICD-10-CM

## 2017-11-27 DIAGNOSIS — B351 Tinea unguium: Secondary | ICD-10-CM | POA: Diagnosis not present

## 2017-11-27 NOTE — Progress Notes (Signed)
Subjective: 77 y.o. returns the office today for painful, elongated, thickened toenails which she is unable to trim herself. Denies any redness or drainage around the nails. Denies any swelling redness or drainage to the nail sites. Denies any acute changes since last appointment and no new complaints today. Denies any systemic complaints such as fevers, chills, nausea, vomiting.   Objective: AAO 3, NAD DP/PT pulses palpable, CRT less than 3 seconds Nails hypertrophic, dystrophic, elongated, brittle, discolored 10. There is tenderness overlying the nails 1-5 bilaterally. There is no surrounding erythema or drainage along the nail sites. Hyperkeratotic lesions of the distal aspect left second digit. Upon debridement there is no underlying ulceration, drainage or other signs of infection.  Minimal hyperkeratotic lesion otherwise No open lesions or pre-ulcerative lesions are identified. No pain with calf compression, swelling, warmth, erythema.  Assessment: Patient presents with symptomatic onychomycosis; hyperkeratotic lesions  Plan: -Treatment options including alternatives, risks, complications were discussed -Nails sharply debrided 10 without complication/bleeding. -Hyperkeratotic lesion sharply debrided 1 without complication/bleeding. -Discussed daily foot inspection. If there are any changes, to call the office immediately.  -Follow-up in 61 days  at her request or sooner if any problems are to arise. In the meantime, encouraged to call the office with any questions, concerns, changes symptoms.  Celesta Gentile, DPM

## 2017-12-01 ENCOUNTER — Ambulatory Visit: Payer: PPO | Admitting: Nurse Practitioner

## 2017-12-07 ENCOUNTER — Ambulatory Visit: Payer: PPO | Admitting: Nurse Practitioner

## 2017-12-15 ENCOUNTER — Ambulatory Visit: Payer: PPO | Admitting: Podiatry

## 2017-12-23 DIAGNOSIS — Z23 Encounter for immunization: Secondary | ICD-10-CM | POA: Diagnosis not present

## 2017-12-30 NOTE — Progress Notes (Signed)
GYN ONCOLOGY OFFICE VISIT    Beth Simon Simon 77 y.o. female  CC:  Endometrial cancer surveillance, VAIN I  Assessment/Plan:  Beth Simon Simon  is a 77 y.o.  witith Stage IA grade 2 endomerial cancer with 30% myoinvasion and LVSI. Staged 03/21/14. Vaginal brachytherapy completed 05/29/2014.   NED  VAIN I Pap WNL +hrHPV Colposcopy with Bx 06/03/2016 c/w VAIN I Options Observation and repap in 1 year Pap 06/16/2017 Neg hrHPV neg.   Follow-up with Gyn Onc  12/2018, 12/2019 Follow-up with Dr.Silva  March 2020  HPI: Beth Simon Simon  is a 77 y.o.   G1 P1 menopausal.  Noted irregular pelvic cramping after the death of her husband.  Vaginal ultrasound 01/10/2014 endometrial stripe 14.74m.  Endometrial mass with cystic changes noted 2x1 cm.  Atrophic ovaries.  Uterine size 6.46x4.21 cm.  EMB 01/10/2014 Complex atypical hyperplasia.  History notable for left breast cancer ER-PR-  treated with radical mastectomy and chemotherapy in 1990.  Subsequently underwent right simple mastectomy 2008 without reconstruction. Denies use of tamoxifen  On 03/21/14  she underwent a robotic hysterectomy, BSO. Frozen section revealed grade 1 cancer. Final pathology revealed a grade 2 lesion with LVSI present and she was staged at stage IA. She was recommended vaginal brachytherapy as adjuvant therapy to reduce risk for local recurrence.  Dr SLanell Personsdelivered vaginal brachytherapy completed on 05/29/14. Her treatments were without complication. She has been using her vaginal dilator since.   Genetic testing 03/2014 negative FINAL DIAGNOSIS Diagnosis - INVASIVE ENDOMETRIOID CARCINOMA, FIGO GRADE II, LIMITED TO THE INNER HALF OF THE MYOMETRIUM WITH ANGIOLYMPHATIC INVASION. ADJACENT ENDOMETRIAL POLYP. - LEIOMYOMATA. - CERVIX: BENIGN SQUAMOUS MUCOSA AND ENDOCERVICAL MUCOSA. NO DYSPLASIA OR MALIGNANCY. - BILATERAL OVARIES: BENIGN OVARIAN TISSUE WITH ENDOSALPINGIOSIS, NO ATYPIA OR MALIGNANCY. - BILATERAL  FALLOPIAN TUBES: BENIGN FALLOPIAN TUBAL TISSUE, NO ATYPIA OR MALIGNANCY.  Specimen: Uterus, cervix, bilateral ovaries and fallopian tubes. Procedure: Total hysterectomy and bilateral salpingo-oophorectomy. Lymph node sampling performed: No. Specimen integrity: Intact. Maximum tumor size: 3.0 cm. Histologic type: Invasive endometrioid adenocarcinoma. Grade: FIGO Grade II. Myometrial invasion: 0.4 cm where myometrium is 1.2 cm in thickness. (30%) Cervical stromal involvement: No. Extent of involvement of other organs: No. Lymph - vascular invasion: Present. Peritoneal washings: N/A. Lymph nodes: # examined N/A ; # positive N/A.  Subsequent genetic testing on the following 25 genes: APC, ATM, BARD1, BMPR1A, BRCA1, BRCA2, BRIP1, CHD1, CDK4, CDKN2A, CHEK2, EPCAM (large rearrangement only), MLH1, MSH2, MSH6, MUTYH, NBN, PALB2, PMS2, PTEN, RAD51C, RAD51D, SMAD4, STK11, and TP53 negative Interval history: Beth Simon PROCELLis doing well.     Pap 05/2016 wnl Pap  05/2017 wnl +hrHPV Colposcopy and bx 06/03/2016 VAIN I.  Beth Simon Simon sore from the colposcopy. Opted for surveillance Pap 05/2017 neg hrHPV neg   Social Hx:   Social History   Socioeconomic History  . Marital status: Widowed    Spouse name: Not on file  . Number of children: 1  . Years of education: Not on file  . Highest education level: Not on file  Occupational History  . Not on file  Social Needs  . Financial resource strain: Not on file  . Food insecurity:    Worry: Not on file    Inability: Not on file  . Transportation needs:    Medical: Not on file    Non-medical: Not on file  Tobacco Use  . Smoking status: Never Smoker  . Smokeless tobacco: Never Used  Substance and Sexual  Activity  . Alcohol use: No  . Drug use: No  . Sexual activity: Never    Partners: Male  Lifestyle  . Physical activity:    Days per week: Not on file    Minutes per session: Not on file  . Stress: Not on file  Relationships  .  Social connections:    Talks on phone: Not on file    Gets together: Not on file    Attends religious service: Not on file    Active member of club or organization: Not on file    Attends meetings of clubs or organizations: Not on file    Relationship status: Not on file  . Intimate partner violence:    Fear of current or ex partner: Not on file    Emotionally abused: Not on file    Physically abused: Not on file    Forced sexual activity: Not on file  Other Topics Concern  . Not on file  Social History Narrative  . Not on file  Husband died Aug 26, 2013.  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  Past Surgical History:  Procedure Laterality Date  . BREAST IMPLANT REMOVAL  6/94   left breast  . BREAST RECONSTRUCTION  1990   left  . CATARACT EXTRACTION Bilateral   . EXCISION VAGINAL CYST    . MASTECTOMY Right 10/08/06   prophylactic  . RADICAL MASTECTOMY LND  1990   left, chemo done  . ROBOTIC ASSISTED TOTAL HYSTERECTOMY WITH BILATERAL SALPINGO OOPHERECTOMY Bilateral 03/21/2014   Procedure: ROBOTIC ASSISTED TOTAL HYSTERECTOMY WITH BILATERAL SALPINGO OOPHORECTOMY ;  Surgeon: Beth Simon Morning, MD;  Location: [facility];  Service: Gynecology;  Laterality: Bilateral;     Past Medical Hx:  Past Medical History:  Diagnosis Date  . Adenomatous polyps 01/2016  . Anemia   . Breast cancer ([place]) 1990   bilateral mastectomy, adenoca breast-left MRM, reconstruction, chemo  . Bronchitis last 2 weeks   saw dr Felipa Eth 02-28-2014, he said no antibiotics needed, nonproductive cough  . Complication of anesthesia   . Cystitis    cytoxen, had once or twice  . Family history of breast cancer   . History of breast cancer   . History of radiation therapy 2/10, 2/12, 2/18, 2/24, 05/29/14   vaginal cuff/ 30 Gy/ 5 fx  . Hypertension   . Macular degeneration   . Neck pain    taking physictal  therapy last 2 weeks  . Osteoporosis    on Evista  . PONV (postoperative nausea and vomiting)   . Shoulder pain    taking physical therapy for last few weeks  . Uterine cancer ([place]) 03/21/2014   MLH1/PMS2 LOH  . Uterine fibroid   . VAIN I (vaginal intraepithelial neoplasia grade I) 2018   positive HR HPV.  . Vasovagal syncopes     Past Gynecological History: G1P1 Menarche 2 . Long history of irregular and heavy mesnses.  Never used birth control. no h/o abnormal pap test Patient's last menstrual period was 03/31/1988 (approximate). 2 sexual partners.  Her second husband was married three times.    Family Hx:  Family History  Problem Relation Age of Onset  . Breast cancer Sister 30       recurrence age 68  . Breast cancer Paternal Grandmother   . Heart disease Mother   . Heart disease Father   . Hypertension  Sister   . Dementia Sister   . Breast cancer Maternal Aunt 68  . Dementia Maternal Aunt        dx in her 56s  Maternal aunt breast cancer dx 68y/o Paternal grandmother breast cancer age at dx ?  REVIEW OF SYSTEMS Constitutional  Feels well Cardiovascular  No chest pain, shortness of breath, or edema,  Pulmonary  No cough or wheeze.  Gastro Intestinal  No nausea, vomitting, or diarrhoea. No bright red blood per rectum, no abdominal pain, change in bowel movement, or constipation.  Occasional fecal incontinence that occurs sometimes while sleeping Genito Urinary  No frequency, urgency, dysuria, vaginal soreness, no vaginal bleeding Musculo Skeletal  No myalgia, arthralgia, joint swelling or pain  Neurologic  No weakness, numbness, change in gait,  Psychology  No depression, anxiety, insomnia.    PHYSICAL EXAMINATION Vitals:  BP 140/67 (BP Location: Right Arm, Patient Position: Sitting)   Pulse 71   Temp 98.8 F (37.1 C) (Oral)   Resp 17   Ht '4\' 11"'$  (1.499 m)   Wt 135 lb 6.4 oz (61.4 kg)   LMP 03/31/1988 (Approximate)   SpO2 98%   BMI 27.35 kg/m   Wt  Readings from Last 3 Encounters:  10/16/17 136 lb 9.6 oz (62 kg)  06/23/17 137 lb (62.1 kg)  06/03/17 135 lb (61.2 kg)   Neck  Supple without any enlargements.  Skin  No rash/lesions/breakdown  Psychiatry  Alert and oriented to person, place, and time  Abdomen  Normoactive bowel sounds, abdomen soft, non-tender and non obese. Surgical sites well healed. No masses or tenderness at the port sites Genito Urinary  Vulva/vagina: Normal external female genitalia.  No lesions. Markedly atrophic vagina.  No discharge or bleeding.   No pelvic tenderness  Bladder/urethra:  No lesions or masses Rectal: Fair tone no masses Extremities  No bilateral cyanosis, clubbing or edema. No rash, lesions or petiche.

## 2017-12-31 ENCOUNTER — Inpatient Hospital Stay: Payer: PPO | Attending: Gynecologic Oncology | Admitting: Gynecologic Oncology

## 2017-12-31 ENCOUNTER — Encounter: Payer: Self-pay | Admitting: Gynecologic Oncology

## 2017-12-31 VITALS — BP 140/67 | HR 71 | Temp 98.8°F | Resp 17 | Ht 59.0 in | Wt 135.4 lb

## 2017-12-31 DIAGNOSIS — Z90722 Acquired absence of ovaries, bilateral: Secondary | ICD-10-CM | POA: Diagnosis not present

## 2017-12-31 DIAGNOSIS — Z923 Personal history of irradiation: Secondary | ICD-10-CM | POA: Diagnosis not present

## 2017-12-31 DIAGNOSIS — Z8542 Personal history of malignant neoplasm of other parts of uterus: Secondary | ICD-10-CM | POA: Insufficient documentation

## 2017-12-31 DIAGNOSIS — Z90710 Acquired absence of both cervix and uterus: Secondary | ICD-10-CM | POA: Insufficient documentation

## 2017-12-31 DIAGNOSIS — Z08 Encounter for follow-up examination after completed treatment for malignant neoplasm: Secondary | ICD-10-CM

## 2017-12-31 DIAGNOSIS — N890 Mild vaginal dysplasia: Secondary | ICD-10-CM | POA: Diagnosis not present

## 2017-12-31 DIAGNOSIS — C541 Malignant neoplasm of endometrium: Secondary | ICD-10-CM

## 2017-12-31 NOTE — Patient Instructions (Signed)
Dr. Skeet Latch will see you in October of 2020. Call the office at [phone] in July of 2020 to scheduled your appointment. See Dr. Quincy Simmonds in March of 2020. Dr. Lanell Persons' appointment has been cancelled for 10-22-2018 as Dr. Skeet Latch discussed.

## 2018-01-15 ENCOUNTER — Ambulatory Visit: Payer: PPO | Admitting: Podiatry

## 2018-01-15 DIAGNOSIS — M79676 Pain in unspecified toe(s): Secondary | ICD-10-CM | POA: Diagnosis not present

## 2018-01-15 DIAGNOSIS — Q828 Other specified congenital malformations of skin: Secondary | ICD-10-CM

## 2018-01-15 DIAGNOSIS — B351 Tinea unguium: Secondary | ICD-10-CM | POA: Diagnosis not present

## 2018-01-16 NOTE — Progress Notes (Signed)
Subjective: 77 y.o. returns the office today for painful, elongated, thickened toenails which she is unable to trim herself. Denies any redness or drainage around the nails. Denies any swelling redness or drainage to the nail sites. Denies any acute changes since last appointment and no new complaints today. Denies any systemic complaints such as fevers, chills, nausea, vomiting.   Objective: AAO 3, NAD DP/PT pulses palpable, CRT less than 3 seconds Nails hypertrophic, dystrophic, elongated, brittle, discolored 10. There is tenderness overlying the nails 1-5 bilaterally. There is no surrounding erythema or drainage along the nail sites. Hyperkeratotic lesions of the distal aspect left second digit. Upon debridement there is no underlying ulceration, drainage or other signs of infection.  Minimal hyperkeratotic lesion otherwise No open lesions or pre-ulcerative lesions are identified. No pain with calf compression, swelling, warmth, erythema.  Assessment: Patient presents with symptomatic onychomycosis; hyperkeratotic lesions  Plan: -Treatment options including alternatives, risks, complications were discussed -Nails sharply debrided 10 without complication/bleeding. -Hyperkeratotic lesion sharply debrided 1 without complication/bleeding. -Discussed daily foot inspection. If there are any changes, to call the office immediately.  -Follow-up in 61 days  at her request or sooner if any problems are to arise. In the meantime, encouraged to call the office with any questions, concerns, changes symptoms.  Celesta Gentile, DPM

## 2018-01-29 DIAGNOSIS — L570 Actinic keratosis: Secondary | ICD-10-CM | POA: Diagnosis not present

## 2018-01-29 DIAGNOSIS — D1801 Hemangioma of skin and subcutaneous tissue: Secondary | ICD-10-CM | POA: Diagnosis not present

## 2018-01-29 DIAGNOSIS — L814 Other melanin hyperpigmentation: Secondary | ICD-10-CM | POA: Diagnosis not present

## 2018-01-29 DIAGNOSIS — D2239 Melanocytic nevi of other parts of face: Secondary | ICD-10-CM | POA: Diagnosis not present

## 2018-01-29 DIAGNOSIS — L821 Other seborrheic keratosis: Secondary | ICD-10-CM | POA: Diagnosis not present

## 2018-01-29 DIAGNOSIS — D225 Melanocytic nevi of trunk: Secondary | ICD-10-CM | POA: Diagnosis not present

## 2018-01-29 DIAGNOSIS — D2261 Melanocytic nevi of right upper limb, including shoulder: Secondary | ICD-10-CM | POA: Diagnosis not present

## 2018-02-10 DIAGNOSIS — Z79899 Other long term (current) drug therapy: Secondary | ICD-10-CM | POA: Diagnosis not present

## 2018-02-10 DIAGNOSIS — I10 Essential (primary) hypertension: Secondary | ICD-10-CM | POA: Diagnosis not present

## 2018-02-10 DIAGNOSIS — E7800 Pure hypercholesterolemia, unspecified: Secondary | ICD-10-CM | POA: Diagnosis not present

## 2018-02-23 ENCOUNTER — Ambulatory Visit: Payer: PPO | Admitting: Podiatry

## 2018-03-04 ENCOUNTER — Ambulatory Visit: Payer: PPO | Admitting: Podiatry

## 2018-03-04 ENCOUNTER — Encounter: Payer: Self-pay | Admitting: Podiatry

## 2018-03-04 DIAGNOSIS — B351 Tinea unguium: Secondary | ICD-10-CM

## 2018-03-04 DIAGNOSIS — Q828 Other specified congenital malformations of skin: Secondary | ICD-10-CM

## 2018-03-04 DIAGNOSIS — M79676 Pain in unspecified toe(s): Secondary | ICD-10-CM

## 2018-03-04 NOTE — Progress Notes (Signed)
Subjective: 77 y.o. returns the office today for painful, elongated, thickened toenails which she is unable to trim herself. Denies any redness or drainage around the nails. Denies any swelling redness or drainage to the nail sites.She is stating that she called her insurance provider and said there was no time limitations on nail trims. Denies any acute changes since last appointment and no new complaints today. Denies any systemic complaints such as fevers, chills, nausea, vomiting.   Objective: AAO 3, NAD DP/PT pulses palpable, CRT less than 3 seconds Nails hypertrophic, dystrophic, elongated, brittle, discolored 10. There is tenderness overlying the nails 1-5 bilaterally. There is no surrounding erythema or drainage along the nail sites. Hyperkeratotic lesions of the distal aspect left second digit. Upon debridement there is no underlying ulceration, drainage or other signs of infection.  Minimal hyperkeratotic lesion otherwise Hammertoes present  No open lesions or pre-ulcerative lesions are identified. No pain with calf compression, swelling, warmth, erythema.  Assessment: Patient presents with symptomatic onychomycosis; hyperkeratotic lesions  Plan: -Treatment options including alternatives, risks, complications were discussed -Nails sharply debrided 10 without complication/bleeding. -Hyperkeratotic lesion sharply debrided 1 without complication/bleeding. -Offloading pads dispensed for hammertoes -Discussed daily foot inspection. If there are any changes, to call the office immediately.  -Follow-up in 4 weeks at her request or sooner if any problems are to arise. In the meantime, encouraged to call the office with any questions, concerns, changes symptoms.  Celesta Gentile, DPM

## 2018-03-29 DIAGNOSIS — R5383 Other fatigue: Secondary | ICD-10-CM | POA: Diagnosis not present

## 2018-03-29 DIAGNOSIS — E559 Vitamin D deficiency, unspecified: Secondary | ICD-10-CM | POA: Diagnosis not present

## 2018-03-29 DIAGNOSIS — M810 Age-related osteoporosis without current pathological fracture: Secondary | ICD-10-CM | POA: Diagnosis not present

## 2018-04-01 ENCOUNTER — Ambulatory Visit: Payer: PPO | Admitting: Podiatry

## 2018-04-01 ENCOUNTER — Encounter: Payer: Self-pay | Admitting: Podiatry

## 2018-04-01 DIAGNOSIS — M79676 Pain in unspecified toe(s): Secondary | ICD-10-CM | POA: Diagnosis not present

## 2018-04-01 DIAGNOSIS — B351 Tinea unguium: Secondary | ICD-10-CM

## 2018-04-01 DIAGNOSIS — Q828 Other specified congenital malformations of skin: Secondary | ICD-10-CM | POA: Diagnosis not present

## 2018-04-06 NOTE — Progress Notes (Signed)
Subjective: 79 y.o. returns the office today for painful, elongated, thickened toenails which she is unable to trim herself. Denies any redness or drainage around the nails. Denies any swelling redness or drainage to the nail sites.Denies any acute changes since last appointment and no new complaints today. Denies any systemic complaints such as fevers, chills, nausea, vomiting.   Objective: AAO 3, NAD DP/PT pulses palpable, CRT less than 3 seconds Nails hypertrophic, dystrophic, elongated, brittle, discolored 10. There is tenderness overlying the nails 1-5 bilaterally. There is no surrounding erythema or drainage along the nail sites. Hyperkeratotic lesions of the distal aspect left second digit. Upon debridement there is no underlying ulceration, drainage or other signs of infection.  Minimal hyperkeratotic lesion otherwise Hammertoes present  No open lesions or pre-ulcerative lesions are identified. No pain with calf compression, swelling, warmth, erythema.  Assessment: Patient presents with symptomatic onychomycosis; hyperkeratotic lesions  Plan: -Treatment options including alternatives, risks, complications were discussed -Nails sharply debrided 10 without complication/bleeding. -Hyperkeratotic lesion sharply debrided 1 without complication/bleeding. -Offloading pads dispensed for hammertoes- dispensed toe regulator.  -Discussed daily foot inspection. If there are any changes, to call the office immediately.  -Follow-up in 4 weeks at her request or sooner if any problems are to arise. In the meantime, encouraged to call the office with any questions, concerns, changes symptoms.  Celesta Gentile, DPM

## 2018-04-07 DIAGNOSIS — M7061 Trochanteric bursitis, right hip: Secondary | ICD-10-CM | POA: Diagnosis not present

## 2018-04-07 DIAGNOSIS — M545 Low back pain: Secondary | ICD-10-CM | POA: Diagnosis not present

## 2018-04-07 DIAGNOSIS — M1611 Unilateral primary osteoarthritis, right hip: Secondary | ICD-10-CM | POA: Diagnosis not present

## 2018-04-13 DIAGNOSIS — M810 Age-related osteoporosis without current pathological fracture: Secondary | ICD-10-CM | POA: Diagnosis not present

## 2018-05-03 ENCOUNTER — Ambulatory Visit: Payer: PPO | Admitting: Obstetrics and Gynecology

## 2018-05-04 DIAGNOSIS — M1611 Unilateral primary osteoarthritis, right hip: Secondary | ICD-10-CM | POA: Diagnosis not present

## 2018-05-05 DIAGNOSIS — M810 Age-related osteoporosis without current pathological fracture: Secondary | ICD-10-CM | POA: Diagnosis not present

## 2018-05-05 DIAGNOSIS — Z8542 Personal history of malignant neoplasm of other parts of uterus: Secondary | ICD-10-CM | POA: Diagnosis not present

## 2018-05-05 DIAGNOSIS — Z853 Personal history of malignant neoplasm of breast: Secondary | ICD-10-CM | POA: Diagnosis not present

## 2018-05-05 DIAGNOSIS — Z90710 Acquired absence of both cervix and uterus: Secondary | ICD-10-CM | POA: Diagnosis not present

## 2018-05-13 ENCOUNTER — Ambulatory Visit: Payer: PPO | Admitting: Podiatry

## 2018-05-13 ENCOUNTER — Encounter: Payer: Self-pay | Admitting: Podiatry

## 2018-05-13 DIAGNOSIS — M79676 Pain in unspecified toe(s): Secondary | ICD-10-CM

## 2018-05-13 DIAGNOSIS — Q828 Other specified congenital malformations of skin: Secondary | ICD-10-CM | POA: Diagnosis not present

## 2018-05-13 DIAGNOSIS — B351 Tinea unguium: Secondary | ICD-10-CM

## 2018-05-24 NOTE — Progress Notes (Signed)
Subjective: 78 y.o. returns the office today for painful, elongated, thickened toenails which she is unable to trim herself as well as for calluses. Denies any redness or drainage around the nails/callus sites. Denies any acute changes since last appointment and no new complaints today. Denies any systemic complaints such as fevers, chills, nausea, vomiting.   Objective: AAO 3, NAD DP/PT pulses palpable, CRT less than 3 seconds Nails hypertrophic, dystrophic, elongated, brittle, discolored 10. There is tenderness overlying the nails 1-5 bilaterally. There is no surrounding erythema or drainage along the nail sites. Hyperkeratotic lesions of the distal aspect left second digit. Upon debridement there is no underlying ulceration, drainage or other signs of infection.  Minimal hyperkeratotic lesion otherwise Hammertoes present  No open lesions or pre-ulcerative lesions are identified. No pain with calf compression, swelling, warmth, erythema.  Assessment: Patient presents with symptomatic onychomycosis; hyperkeratotic lesions  Plan: -Treatment options including alternatives, risks, complications were discussed -Nails sharply debrided 10 without complication/bleeding. -Hyperkeratotic lesion sharply debrided 1 without complication/bleeding. -Continue offloading pads -Discussed daily foot inspection. If there are any changes, to call the office immediately.  -Follow-up in 4 weeks at her request or sooner if any problems are to arise. In the meantime, encouraged to call the office with any questions, concerns, changes symptoms.  Celesta Gentile, DPM

## 2018-05-31 DIAGNOSIS — M1611 Unilateral primary osteoarthritis, right hip: Secondary | ICD-10-CM | POA: Diagnosis not present

## 2018-05-31 DIAGNOSIS — M25551 Pain in right hip: Secondary | ICD-10-CM | POA: Diagnosis not present

## 2018-06-01 ENCOUNTER — Telehealth: Payer: Self-pay | Admitting: Obstetrics and Gynecology

## 2018-06-01 NOTE — Telephone Encounter (Signed)
Please contact patient to see if she received results from her BMD from [facility] for 05/05/18. Dr. Wandra Feinstein ordered this study.  She has been receiving Prolia.   Her final dx is still osteoporosis.  She has improvement in her spine, decrease bone density in her left hip, and stability in her right hip and left radius.   If she has questions about her treatment, please have her contact Dr. Kathrin Penner office.

## 2018-06-01 NOTE — Telephone Encounter (Signed)
Spoke with patient, advised as seen below per Dr. Quincy Simmonds. Patient has not been contacted by Dr. Layne Benton to date. Patient thankful for call, she will f/u with Dr. Layne Benton for Prolia recommendations. Patient verbalizes understanding and is agreeable.   BMD report to scan.   Encounter closed.

## 2018-06-10 ENCOUNTER — Ambulatory Visit: Payer: PPO | Admitting: Podiatry

## 2018-06-14 ENCOUNTER — Ambulatory Visit: Payer: PPO | Admitting: Podiatry

## 2018-07-02 ENCOUNTER — Ambulatory Visit: Payer: PPO | Admitting: Obstetrics and Gynecology

## 2018-08-18 ENCOUNTER — Other Ambulatory Visit: Payer: Self-pay

## 2018-08-20 ENCOUNTER — Ambulatory Visit (INDEPENDENT_AMBULATORY_CARE_PROVIDER_SITE_OTHER): Payer: PPO | Admitting: Obstetrics and Gynecology

## 2018-08-20 ENCOUNTER — Encounter: Payer: Self-pay | Admitting: Obstetrics and Gynecology

## 2018-08-20 ENCOUNTER — Other Ambulatory Visit (HOSPITAL_COMMUNITY)
Admission: RE | Admit: 2018-08-20 | Discharge: 2018-08-20 | Disposition: A | Discharge status: Home / Self Care | Payer: PPO | Source: Ambulatory Visit | Attending: Obstetrics and Gynecology | Admitting: Obstetrics and Gynecology

## 2018-08-20 ENCOUNTER — Other Ambulatory Visit: Payer: Self-pay

## 2018-08-20 VITALS — BP 132/76 | HR 76 | Temp 98.0°F | Resp 14 | Ht 58.75 in | Wt 135.0 lb

## 2018-08-20 DIAGNOSIS — Z23 Encounter for immunization: Secondary | ICD-10-CM | POA: Diagnosis not present

## 2018-08-20 DIAGNOSIS — Z01419 Encounter for gynecological examination (general) (routine) without abnormal findings: Secondary | ICD-10-CM | POA: Diagnosis not present

## 2018-08-20 DIAGNOSIS — Z8542 Personal history of malignant neoplasm of other parts of uterus: Secondary | ICD-10-CM | POA: Diagnosis not present

## 2018-08-20 DIAGNOSIS — Z1151 Encounter for screening for human papillomavirus (HPV): Secondary | ICD-10-CM | POA: Diagnosis not present

## 2018-08-20 DIAGNOSIS — N952 Postmenopausal atrophic vaginitis: Secondary | ICD-10-CM | POA: Insufficient documentation

## 2018-08-20 NOTE — Progress Notes (Signed)
78 y.o. [id number] Widowed Caucasian female here for annual exam.    Husband died 5 years ago and she also received dx of endometrial cancer at this same time.  No vaginal bleeding.  No pelvic pain.  No bleed in urine or stool.  She does have some fecal incontinence occasionally, once every couple of months.  She does not have any warning of this.  She has done some pelvic floor PT and Earlie Counts.   PCP: Lajean Manes, MD     Patient's last menstrual period was 03/31/1988 (approximate).           Sexually active: No.  The current method of family planning is status post hysterectomy -- hx of uterine cancer.    Exercising: Yes.    walking Smoker:  no  Health Maintenance: Pap:  2018 neg HPV HR+,  Pap 06/16/17 neg, HR HPV negative.  History of abnormal Pap:  yes MMG:  Bilateral mastectomy Colonoscopy:  2017 polyps f/u 58yr.  Will arrange through her PCP.  BMD:   2020  Result  osteoporosis TDaP:  2009 HIV: donated blood in the past Hep C: donated blood in the past Screening Labs:  PCP   reports that she has never smoked. She has never used smokeless tobacco. She reports that she does not drink alcohol or use drugs.  Past Medical History:  Diagnosis Date  . Adenomatous polyps 01/2016  . Anemia   . Breast cancer ([facility] 1990   bilateral mastectomy, adenoca breast-left MRM, reconstruction, chemo  . Bronchitis last 2 weeks   saw dr sFelipa Eth12-03-2013, he said no antibiotics needed, nonproductive cough  . Complication of anesthesia   . Cystitis    cytoxen, had once or twice  . Family history of breast cancer   . History of breast cancer   . History of radiation therapy 2/10, 2/12, 2/18, 2/24, 05/29/14   vaginal cuff/ 30 Gy/ 5 fx  . Hypertension   . Macular degeneration   . Neck pain    taking physictal therapy last 2 weeks  . Osteoporosis    on Evista  . PONV (postoperative nausea and vomiting)   . Shoulder pain    taking physical therapy for last few weeks  . Uterine  cancer ([facility] 03/21/2014   MLH1/PMS2 LOH  . Uterine fibroid   . VAIN I (vaginal intraepithelial neoplasia grade I) 2018   positive HR HPV.  . Vasovagal syncopes     Past Surgical History:  Procedure Laterality Date  . BREAST IMPLANT REMOVAL  6/94   left breast  . BREAST RECONSTRUCTION  1990   left  . CATARACT EXTRACTION Bilateral   . EXCISION VAGINAL CYST    . MASTECTOMY Right 10/08/06   prophylactic  . RADICAL MASTECTOMY LND  1990   left, chemo done  . ROBOTIC ASSISTED TOTAL HYSTERECTOMY WITH BILATERAL SALPINGO OOPHERECTOMY Bilateral 03/21/2014   Procedure: ROBOTIC ASSISTED TOTAL HYSTERECTOMY WITH BILATERAL SALPINGO OOPHORECTOMY ;  Surgeon: WJanie Morning MD;  Location: [facility];  Service: Gynecology;  Laterality: Bilateral;    Current Outpatient Medications  Medication Sig Dispense Refill  . Coenzyme Q10 (COQ10 PO) Take 1 tablet by mouth every morning.     . Cyanocobalamin (VITAMIN B 12 PO) Take 1 tablet by mouth every morning.     . denosumab (PROLIA) 60 MG/ML SOLN injection Inject 60 mg into the skin every 6 (six) months. Administer in upper arm, thigh, or abdomen    . Flaxseed, Linseed, (FLAX SEEDS PO) Take 1  scoop by mouth every morning. Ground flaxseed in cereal.    . lisinopril (PRINIVIL,ZESTRIL) 5 MG tablet Take 5 mg by mouth daily.    Marland Kitchen MELATONIN PO Take 1 tablet by mouth at bedtime as needed (sleep.).     Marland Kitchen OVER THE COUNTER MEDICATION Take 1 tablet by mouth every morning. Astazanthin. (anti-oxidant)    . VITAMIN D, CHOLECALCIFEROL, PO Take 4,000 Int'l Units by mouth every morning.      No current facility-administered medications for this visit.     Family History  Problem Relation Age of Onset  . Breast cancer Sister 67       recurrence age 53  . Breast cancer Paternal Grandmother   . Heart disease Mother   . Heart disease Father   . Hypertension Sister   . Dementia Sister   . Breast cancer Maternal Aunt 68  . Dementia Maternal Aunt        dx in her 33s     Review of Systems  Constitutional: Negative.   HENT: Negative.   Eyes: Negative.   Respiratory: Negative.   Cardiovascular: Negative.   Gastrointestinal: Negative.   Endocrine: Negative.   Genitourinary: Negative.   Musculoskeletal: Negative.   Skin: Negative.   Allergic/Immunologic: Negative.   Neurological: Negative.   Hematological: Negative.   Psychiatric/Behavioral: Negative.     Exam:   BP 132/76 (BP Location: Right Arm, Patient Position: Sitting, Cuff Size: Normal)   Pulse 76   Temp 98 F (36.7 C) (Temporal)   Resp 14   Ht 4' 10.75" (1.492 m)   Wt 135 lb (61.2 kg)   LMP 03/31/1988 (Approximate)   BMI 27.50 kg/m     General appearance: alert, cooperative and appears stated age Head: Normocephalic, without obvious abnormality, atraumatic Neck: no adenopathy, supple, symmetrical, trachea midline and thyroid normal to inspection and palpation Lungs: clear to auscultation bilaterally Breasts:  absent Heart: regular rate and rhythm Abdomen: soft, non-tender; no masses, no organomegaly Extremities: extremities normal, atraumatic, no cyanosis or edema Skin: Skin color, texture, turgor normal. No rashes or lesions Lymph nodes: Cervical, supraclavicular, and axillary nodes normal. No abnormal inguinal nodes palpated Neurologic: Grossly normal  Pelvic: External genitalia:  no lesions              Urethra:  normal appearing urethra with no masses, tenderness or lesions              Bartholins and Skenes: normal                 Vagina: normal appearing vagina with normal color and discharge, no lesions              Cervix:  abent              Pap taken: Yes.   Bimanual Exam:  Uterus:   absent              Adnexa: no mass, fullness, tenderness              Rectal exam: Yes.  .  Confirms.              Anus:  normal sphincter tone, no lesions  Chaperone was present for exam.  Assessment:   Well woman visit with gynecologic exam. Status post robotic hysterectomy  with BSO.  Status post vaginal brachytherapy. Pap smear 05/16/16 - negative and positive HR HPV.  Colposcopy with vaginal biopsy 06/02/16 - VAIN I.  Fecal soiling.  Status post bilateral mastectomy.  Osteoporosis.  On Prolia through ortho.   Plan:  Pap and HR HPV as above. Guidelines for Calcium, Vitamin D, regular exercise program including cardiovascular and weight bearing exercise. TDap. Try Metamucil.  See Dr. Skeet Latch in October 2020.  If fecal soiling persists, I recommend she see Dr. Lenise Arena at [facility]. Follow up annually and prn.   After visit summary provided.

## 2018-08-27 LAB — CYTOLOGY - PAP: HPV: NOT DETECTED

## 2018-09-06 ENCOUNTER — Other Ambulatory Visit: Payer: Self-pay

## 2018-09-06 ENCOUNTER — Encounter: Payer: Self-pay | Admitting: Podiatry

## 2018-09-06 ENCOUNTER — Ambulatory Visit: Payer: PPO | Admitting: Podiatry

## 2018-09-06 VITALS — Temp 98.1°F

## 2018-09-06 DIAGNOSIS — B351 Tinea unguium: Secondary | ICD-10-CM

## 2018-09-06 DIAGNOSIS — M79676 Pain in unspecified toe(s): Secondary | ICD-10-CM | POA: Diagnosis not present

## 2018-09-06 DIAGNOSIS — Q828 Other specified congenital malformations of skin: Secondary | ICD-10-CM | POA: Diagnosis not present

## 2018-09-06 NOTE — Progress Notes (Signed)
Subjective: 78 y.o. returns the office today for painful, elongated, thickened toenails which she is unable to trim herself as well as for calluses.  Patient denies she has not been able to the office is been try do it herself.  Denies any acute changes since last appointment and no new complaints today. Denies any systemic complaints such as fevers, chills, nausea, vomiting.   Objective: AAO 3, NAD DP/PT pulses palpable, CRT less than 3 seconds Nails hypertrophic, dystrophic, elongated, brittle, discolored 10. There is tenderness overlying the nails 1-5 bilaterally. There is no surrounding erythema or drainage along the nail sites. Hyperkeratotic lesions of the distal aspect left second digit. Upon debridement there is no underlying ulceration, drainage or other signs of infection.  Minimal hyperkeratotic lesion otherwise Hammertoes present  No open lesions or pre-ulcerative lesions are identified. No pain with calf compression, swelling, warmth, erythema.  Assessment: Patient presents with symptomatic onychomycosis; hyperkeratotic lesions  Plan: -Treatment options including alternatives, risks, complications were discussed -Nails sharply debrided 10 without complication/bleeding. -Hyperkeratotic lesion sharply debrided 1 without complication/bleeding. -Continue offloading pads -Discussed daily foot inspection. If there are any changes, to call the office immediately.  -Follow-up in 4 weeks at her request or sooner if any problems are to arise. In the meantime, encouraged to call the office with any questions, concerns, changes symptoms.  Celesta Gentile, DPM

## 2018-09-28 DIAGNOSIS — E7800 Pure hypercholesterolemia, unspecified: Secondary | ICD-10-CM | POA: Diagnosis not present

## 2018-09-28 DIAGNOSIS — Z0000 Encounter for general adult medical examination without abnormal findings: Secondary | ICD-10-CM | POA: Diagnosis not present

## 2018-09-28 DIAGNOSIS — Z23 Encounter for immunization: Secondary | ICD-10-CM | POA: Diagnosis not present

## 2018-09-28 DIAGNOSIS — Z79899 Other long term (current) drug therapy: Secondary | ICD-10-CM | POA: Diagnosis not present

## 2018-09-28 DIAGNOSIS — E041 Nontoxic single thyroid nodule: Secondary | ICD-10-CM | POA: Diagnosis not present

## 2018-09-28 DIAGNOSIS — I10 Essential (primary) hypertension: Secondary | ICD-10-CM | POA: Diagnosis not present

## 2018-09-28 DIAGNOSIS — M810 Age-related osteoporosis without current pathological fracture: Secondary | ICD-10-CM | POA: Diagnosis not present

## 2018-09-28 DIAGNOSIS — Z1389 Encounter for screening for other disorder: Secondary | ICD-10-CM | POA: Diagnosis not present

## 2018-09-28 DIAGNOSIS — K219 Gastro-esophageal reflux disease without esophagitis: Secondary | ICD-10-CM | POA: Diagnosis not present

## 2018-10-11 ENCOUNTER — Ambulatory Visit: Payer: PPO | Admitting: Podiatry

## 2018-10-11 ENCOUNTER — Other Ambulatory Visit: Payer: Self-pay

## 2018-10-11 ENCOUNTER — Encounter: Payer: Self-pay | Admitting: Podiatry

## 2018-10-11 VITALS — Temp 97.8°F

## 2018-10-11 DIAGNOSIS — M79676 Pain in unspecified toe(s): Secondary | ICD-10-CM | POA: Diagnosis not present

## 2018-10-11 DIAGNOSIS — Q828 Other specified congenital malformations of skin: Secondary | ICD-10-CM

## 2018-10-11 DIAGNOSIS — B351 Tinea unguium: Secondary | ICD-10-CM

## 2018-10-12 ENCOUNTER — Telehealth: Payer: Self-pay | Admitting: *Deleted

## 2018-10-12 DIAGNOSIS — M79662 Pain in left lower leg: Secondary | ICD-10-CM

## 2018-10-12 DIAGNOSIS — R609 Edema, unspecified: Secondary | ICD-10-CM

## 2018-10-12 DIAGNOSIS — M79661 Pain in right lower leg: Secondary | ICD-10-CM

## 2018-10-12 NOTE — Telephone Encounter (Signed)
I informed pt of the change to [facility].

## 2018-10-12 NOTE — Telephone Encounter (Signed)
I reordered and faxed the Venous dopplers with [facility].

## 2018-10-12 NOTE — Telephone Encounter (Signed)
Rule out DVT

## 2018-10-12 NOTE — Telephone Encounter (Signed)
Pt states she received a call from [facility] and she had told Dr. Jacqualyn Posey she had be seen at [facility], and she would like to continue there.

## 2018-10-12 NOTE — Telephone Encounter (Signed)
Orders faxed to [facility]. 

## 2018-10-13 DIAGNOSIS — I83891 Varicose veins of right lower extremities with other complications: Secondary | ICD-10-CM | POA: Diagnosis not present

## 2018-10-13 DIAGNOSIS — R600 Localized edema: Secondary | ICD-10-CM | POA: Diagnosis not present

## 2018-10-13 NOTE — Progress Notes (Signed)
Subjective: 78 y.o. returns the office today for painful, elongated, thickened toenails which she is unable to trim herself as well as for calluses. Denies any acute changes since last appointment and no new complaints today. Denies any systemic complaints such as fevers, chills, nausea, vomiting.   Objective: AAO 3, NAD DP/PT pulses palpable, CRT less than 3 seconds Nails hypertrophic, dystrophic, elongated, brittle, discolored 10. There is tenderness overlying the nails 1-5 bilaterally. There is no surrounding erythema or drainage along the nail sites. Hyperkeratotic lesions of the distal aspect left second digit. Upon debridement there is no underlying ulceration, drainage or other signs of infection.  Minimal hyperkeratotic lesion otherwise Hammertoes present  No open lesions or pre-ulcerative lesions are identified. No pain with calf compression, swelling, warmth, erythema.  Assessment: Patient presents with symptomatic onychomycosis; hyperkeratotic lesions  Plan: -Treatment options including alternatives, risks, complications were discussed -Nails sharply debrided 10 without complication/bleeding. -Hyperkeratotic lesion sharply debrided 1 without complication/bleeding. -Continue offloading pads -Discussed daily foot inspection. If there are any changes, to call the office immediately.  -Follow-up in 4 weeks at her request or sooner if any problems are to arise. In the meantime, encouraged to call the office with any questions, concerns, changes symptoms.  Celesta Gentile, DPM

## 2018-10-19 DIAGNOSIS — E559 Vitamin D deficiency, unspecified: Secondary | ICD-10-CM | POA: Diagnosis not present

## 2018-10-19 DIAGNOSIS — M810 Age-related osteoporosis without current pathological fracture: Secondary | ICD-10-CM | POA: Diagnosis not present

## 2018-10-22 ENCOUNTER — Ambulatory Visit: Payer: Self-pay | Admitting: Radiation Oncology

## 2018-10-26 DIAGNOSIS — M810 Age-related osteoporosis without current pathological fracture: Secondary | ICD-10-CM | POA: Diagnosis not present

## 2018-10-26 DIAGNOSIS — M1611 Unilateral primary osteoarthritis, right hip: Secondary | ICD-10-CM | POA: Diagnosis not present

## 2018-11-08 ENCOUNTER — Ambulatory Visit: Payer: PPO | Admitting: Podiatry

## 2018-11-15 DIAGNOSIS — E7800 Pure hypercholesterolemia, unspecified: Secondary | ICD-10-CM | POA: Diagnosis not present

## 2018-11-18 ENCOUNTER — Ambulatory Visit: Payer: PPO | Admitting: Podiatry

## 2018-11-18 ENCOUNTER — Other Ambulatory Visit: Payer: Self-pay

## 2018-11-18 ENCOUNTER — Encounter: Payer: Self-pay | Admitting: Podiatry

## 2018-11-18 DIAGNOSIS — M79676 Pain in unspecified toe(s): Secondary | ICD-10-CM

## 2018-11-18 DIAGNOSIS — Q828 Other specified congenital malformations of skin: Secondary | ICD-10-CM | POA: Diagnosis not present

## 2018-11-18 DIAGNOSIS — B351 Tinea unguium: Secondary | ICD-10-CM

## 2018-11-24 NOTE — Progress Notes (Signed)
Subjective: 78 y.o. returns the office today for painful, elongated, thickened toenails which she is unable to trim herself as well as for callus to her 2nd toe. Denies any acute changes since last appointment and no new complaints today. Denies any systemic complaints such as fevers, chills, nausea, vomiting.   Objective: AAO 3, NAD DP/PT pulses palpable, CRT less than 3 seconds Nails hypertrophic, dystrophic, elongated, brittle, discolored 10. There is tenderness overlying the nails 1-5 bilaterally. There is no surrounding erythema or drainage along the nail sites. Hyperkeratotic lesions of the distal aspect left second digit. No underlying ulceration, drainage or other signs of infection.  Minimal hyperkeratotic lesion otherwise Hammertoes present  No open lesions or pre-ulcerative lesions are identified. No pain with calf compression, swelling, warmth, erythema.  Assessment: Patient presents with symptomatic onychomycosis; hyperkeratotic lesions  Plan: -Treatment options including alternatives, risks, complications were discussed -Nails sharply debrided 10 without complication/bleeding. -Hyperkeratotic lesion sharply debrided 1 without complication/bleeding. -Continue offloading pads -Discussed daily foot inspection. If there are any changes, to call the office immediately.  -Follow-up in 4 weeks at her request or sooner if any problems are to arise. In the meantime, encouraged to call the office with any questions, concerns, changes symptoms.  Celesta Gentile, DPM

## 2018-12-01 ENCOUNTER — Telehealth: Payer: Self-pay | Admitting: *Deleted

## 2018-12-01 NOTE — Telephone Encounter (Signed)
Called and follow up appt for the patient

## 2018-12-09 DIAGNOSIS — H524 Presbyopia: Secondary | ICD-10-CM | POA: Diagnosis not present

## 2018-12-09 DIAGNOSIS — H26491 Other secondary cataract, right eye: Secondary | ICD-10-CM | POA: Diagnosis not present

## 2018-12-09 DIAGNOSIS — Z961 Presence of intraocular lens: Secondary | ICD-10-CM | POA: Diagnosis not present

## 2018-12-15 DIAGNOSIS — Z23 Encounter for immunization: Secondary | ICD-10-CM | POA: Diagnosis not present

## 2018-12-16 ENCOUNTER — Ambulatory Visit (INDEPENDENT_AMBULATORY_CARE_PROVIDER_SITE_OTHER): Payer: PPO | Admitting: Podiatry

## 2018-12-16 ENCOUNTER — Ambulatory Visit (INDEPENDENT_AMBULATORY_CARE_PROVIDER_SITE_OTHER): Payer: PPO

## 2018-12-16 ENCOUNTER — Other Ambulatory Visit: Payer: Self-pay

## 2018-12-16 ENCOUNTER — Encounter: Payer: Self-pay | Admitting: Podiatry

## 2018-12-16 DIAGNOSIS — M79676 Pain in unspecified toe(s): Secondary | ICD-10-CM | POA: Diagnosis not present

## 2018-12-16 DIAGNOSIS — M779 Enthesopathy, unspecified: Secondary | ICD-10-CM

## 2018-12-16 DIAGNOSIS — B351 Tinea unguium: Secondary | ICD-10-CM | POA: Diagnosis not present

## 2018-12-16 DIAGNOSIS — M778 Other enthesopathies, not elsewhere classified: Secondary | ICD-10-CM

## 2018-12-16 DIAGNOSIS — Q828 Other specified congenital malformations of skin: Secondary | ICD-10-CM | POA: Diagnosis not present

## 2018-12-16 NOTE — Progress Notes (Signed)
Subjective: 78 y.o. returns the office today for painful, elongated, thickened toenails which she is unable to trim herself as well as for callus to her 2nd toe.  She states that about a month ago she was coming out of [employer] and she put her foot into a pothole while she was walking and she twisted her foot.  She had no immediate pain and no bruising or swelling but she has had some pain to the inside aspect of her foot.  She points more to the medial aspect.  She is tried Voltaren gel which is been helpful.  She says it was getting better but to get some sharp pain however it started to resolve again.Denies any systemic complaints such as fevers, chills, nausea, vomiting.   Objective: AAO 3, NAD DP/PT pulses palpable, CRT less than 3 seconds Nails hypertrophic, dystrophic, elongated, brittle, discolored 10. There is tenderness overlying the nails 1-5 bilaterally. There is no surrounding erythema or drainage along the nail sites. Hyperkeratotic lesions of the distal aspect left second digit. No underlying ulceration, drainage or other signs of infection.  Minimal hyperkeratotic lesion otherwise Mild tenderness palpation on the medial aspect foot mostly on the first metatarsal cuneiform joint there is no specific area of pinpoint tenderness.  No pain with dorsal foot appears to be localized just along the medial aspect.  No edema, erythema or bruising. Hammertoes present  No open lesions or pre-ulcerative lesions are identified. No pain with calf compression, swelling, warmth, erythema.  Assessment: Patient presents with symptomatic onychomycosis; hyperkeratotic lesions; denies/capsulitis left foot  Plan: -Treatment options including alternatives, risks, complications were discussed -Nails sharply debrided 10 without complication/bleeding. -Hyperkeratotic lesion sharply debrided 1 without complication/bleeding. -X-rays ordered and reviewed.  No evidence of acute fracture.  Mild arthritic  changes present.  Chronic continue Voltaren gel as well as ice the area daily.  Dispensed a compression sock for support. -Discussed daily foot inspection. If there are any changes, to call the office immediately.  -Follow-up in 4 weeks at her request or sooner if any problems are to arise. In the meantime, encouraged to call the office with any questions, concerns, changes symptoms.  Celesta Gentile, DPM

## 2019-01-03 ENCOUNTER — Telehealth: Payer: Self-pay | Admitting: *Deleted

## 2019-01-03 NOTE — Telephone Encounter (Signed)
Called and moved her appt on 10/8 to late in the day

## 2019-01-05 NOTE — Progress Notes (Signed)
GYN ONCOLOGY OFFICE VISIT    Beth Simon 78 y.o. female  CC:  Endometrial cancer surveillance, VAIN I  Assessment/Plan:  [name]  is a 78 y.o.  witith Stage IA grade 2 endomerial cancer with 30% myoinvasion and LVSI. Staged 03/21/14. Vaginal brachytherapy completed 05/29/2014.   NED  VAIN I Pap WNL +hrHPV Colposcopy with Bx 06/03/2016 c/w VAIN I Options Observation and repap in 1 year Pap 06/16/2017 Neg hrHPV neg.   Follow-up with Gyn Onc  Per Dr. Quincy Simmonds or complaints Follow-up with Dr.Silva  March 2021  HTN Managed by PCP.  No HA or chest pain  HPI: [name]  is a 78 y.o.   G1 P1 menopausal.  Noted irregular pelvic cramping after the death of her Beth Simon.  Vaginal ultrasound 01/10/2014 endometrial stripe 14.59m.  Endometrial mass with cystic changes noted 2x1 cm.  Atrophic ovaries.  Uterine size 6.46x4.21 cm.  EMB 01/10/2014 Complex atypical hyperplasia.  History notable for left breast cancer ER-PR-  treated with radical mastectomy and chemotherapy in 1990.  Subsequently underwent right simple mastectomy 2008 without reconstruction. Denies use of tamoxifen  On 03/21/14  she underwent a robotic hysterectomy, BSO. Frozen section revealed grade 1 cancer. Final pathology revealed a grade 2 lesion with LVSI present and she was staged at stage IA. She was recommended vaginal brachytherapy as adjuvant therapy to reduce risk for local recurrence.  Dr SLanell Personsdelivered vaginal brachytherapy completed on 05/29/14. Her treatments were without complication. She has been using her vaginal dilator since.   Genetic testing 03/2014 negative FINAL DIAGNOSIS Diagnosis - INVASIVE ENDOMETRIOID CARCINOMA, FIGO GRADE II, LIMITED TO THE INNER HALF OF THE MYOMETRIUM WITH ANGIOLYMPHATIC INVASION. ADJACENT ENDOMETRIAL POLYP. - LEIOMYOMATA. - CERVIX: BENIGN SQUAMOUS MUCOSA AND ENDOCERVICAL MUCOSA. NO DYSPLASIA OR MALIGNANCY. - BILATERAL OVARIES: BENIGN OVARIAN TISSUE WITH  ENDOSALPINGIOSIS, NO ATYPIA OR MALIGNANCY. - BILATERAL FALLOPIAN TUBES: BENIGN FALLOPIAN TUBAL TISSUE, NO ATYPIA OR MALIGNANCY.  Specimen: Uterus, cervix, bilateral ovaries and fallopian tubes. Procedure: Total hysterectomy and bilateral salpingo-oophorectomy. Lymph node sampling performed: No. Specimen integrity: Intact. Maximum tumor size: 3.0 cm. Histologic type: Invasive endometrioid adenocarcinoma. Grade: FIGO Grade II. Myometrial invasion: 0.4 cm where myometrium is 1.2 cm in thickness. (30%) Cervical stromal involvement: No. Extent of involvement of other organs: No. Lymph - vascular invasion: Present. Peritoneal washings: N/A. Lymph nodes: # examined N/A ; # positive N/A.  Subsequent genetic testing on the following 25 genes: APC, ATM, BARD1, BMPR1A, BRCA1, BRCA2, BRIP1, CHD1, CDK4, CDKN2A, CHEK2, EPCAM (large rearrangement only), MLH1, MSH2, MSH6, MUTYH, NBN, PALB2, PMS2, PTEN, RAD51C, RAD51D, SMAD4, STK11, and TP53 negative Interval history: BHAWLEY PAVIAis doing well.     Pap 05/2016 wnl Pap  05/2017 wnl +hrHPV Colposcopy and bx 06/03/2016 VAIN I.  Beth TATis sore from the colposcopy. Opted for surveillance Pap 05/2017 neg hrHPV neg   Social Hx:   Social History   Socioeconomic History  . Marital status: Widowed    Spouse name: Not on file  . Number of children: 1  . Years of education: Not on file  . Highest education level: Not on file  Occupational History  . Not on file  Social Needs  . Financial resource strain: Not on file  . Food insecurity    Worry: Not on file    Inability: Not on file  . Transportation needs    Medical: Not on file    Non-medical: Not on file  Tobacco Use  .  Smoking status: Never Smoker  . Smokeless tobacco: Never Used  Substance and Sexual Activity  . Alcohol use: No  . Drug use: No  . Sexual activity: Not Currently    Partners: Male  Lifestyle  . Physical activity    Days per week: Not on file    Minutes per  session: Not on file  . Stress: Not on file  Relationships  . Social Herbalist on phone: Not on file    Gets together: Not on file    Attends religious service: Not on file    Active member of club or organization: Not on file    Attends meetings of clubs or organizations: Not on file    Relationship status: Not on file  . Intimate partner violence    Fear of current or ex partner: Not on file    Emotionally abused: Not on file    Physically abused: Not on file    Forced sexual activity: Not on file  Other Topics Concern  . Not on file  Social History Narrative  . Not on file  Beth Simon died 08-09-2013.  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  Past Surgical History:  Procedure Laterality Date  . BREAST IMPLANT REMOVAL  6/94   left breast  . BREAST RECONSTRUCTION  1990   left  . CATARACT EXTRACTION Bilateral   . EXCISION VAGINAL CYST    . MASTECTOMY Right 10/08/06   prophylactic  . RADICAL MASTECTOMY LND  1990   left, chemo done  . ROBOTIC ASSISTED TOTAL HYSTERECTOMY WITH BILATERAL SALPINGO OOPHERECTOMY Bilateral 03/21/2014   Procedure: ROBOTIC ASSISTED TOTAL HYSTERECTOMY WITH BILATERAL SALPINGO OOPHORECTOMY ;  Surgeon: Janie Morning, MD;  Location: [facility];  Service: Gynecology;  Laterality: Bilateral;     Past Medical Hx:  Past Medical History:  Diagnosis Date  . Adenomatous polyps 01/2016  . Anemia   . Breast cancer ([place]) 1990   bilateral mastectomy, adenoca breast-left MRM, reconstruction, chemo  . Bronchitis last 2 weeks   saw dr Felipa Eth 02-28-2014, he said no antibiotics needed, nonproductive cough  . Complication of anesthesia   . Cystitis    cytoxen, had once or twice  . Family history of breast cancer   . History of breast cancer   . History of radiation therapy 2/10, 2/12, 2/18, 2/24, 05/29/14   vaginal cuff/ 30 Gy/ 5 fx  . Hypertension    . Macular degeneration   . Neck pain    taking physictal therapy last 2 weeks  . Osteoporosis    on Evista  . PONV (postoperative nausea and vomiting)   . Shoulder pain    taking physical therapy for last few weeks  . Uterine cancer ([place]) 03/21/2014   MLH1/PMS2 LOH  . Uterine fibroid   . VAIN I (vaginal intraepithelial neoplasia grade I) 2018   positive HR HPV.  . Vasovagal syncopes     Past Gynecological History: G1P1 Menarche 61 . Long history of irregular and heavy mesnses.  Never used birth control. no h/o abnormal pap test Patient's last menstrual period was 03/31/1988 (approximate). 2 sexual partners.  Her second Beth Simon was married three times.  Has not seen her son in six months.  Is managing isolation in a positive manner.  Engages with her church community   Family Hx:  Family History  Problem Relation Age of Onset  .  Breast cancer Sister 1       recurrence age 76  . Breast cancer Paternal Grandmother   . Heart disease Mother   . Heart disease Father   . Hypertension Sister   . Dementia Sister   . Breast cancer Maternal Aunt 68  . Dementia Maternal Aunt        dx in her 85s  Maternal aunt breast cancer dx 68y/o Paternal grandmother breast cancer age at dx ?  REVIEW OF SYSTEMS Constitutional  Feels well Cardiovascular  No chest pain, shortness of breath, or edema,  Pulmonary  No cough or wheeze.  Gastro Intestinal  No nausea, vomitting, or diarrhoea. No bright red blood per rectum, no abdominal pain, change in bowel movement, or constipation.  Occasional fecal incontinence that occurs , occasional urinary while sleeping and urge incontinence Genito Urinary  No frequency, urgency, dysuria, vaginal soreness, no vaginal bleeding Musculo Skeletal  No myalgia, arthralgia, joint swelling or pain  Neurologic  No weakness, numbness, change in gait,  Psychology  No depression, anxiety, insomnia.    PHYSICAL EXAMINATION Vitals:  BP (!) 154/63 (BP Location:  Right Arm, Patient Position: Sitting)   Pulse 72   Temp 98.7 F (37.1 C) (Temporal)   Resp 17   Ht 4' 10.75" (1.492 m)   Wt 135 lb (61.2 kg)   LMP 03/31/1988 (Approximate)   SpO2 98%   BMI 27.50 kg/m   Wt Readings from Last 3 Encounters:  01/06/19 135 lb (61.2 kg)  08/20/18 135 lb (61.2 kg)  12/31/17 135 lb 6.4 oz (61.4 kg)   Neck  Supple without any enlargements.  Skin  No rash/lesions/breakdown  Psychiatry  Alert and oriented to person, place, and time  Abdomen  Normoactive bowel sounds, abdomen soft, non-tender and non obese. Surgical sites well healed. No masses or tenderness at the port sites Genito Urinary  Vulva/vagina: Normal external female genitalia.  No lesions. Markedly atrophic vagina.  No discharge or bleeding.   No pelvic tenderness  Bladder/urethra:  No lesions or masses Rectal: Fair tone no masses Extremities  No bilateral cyanosis, clubbing or edema. No rash, lesions or petiche.

## 2019-01-06 ENCOUNTER — Inpatient Hospital Stay: Payer: PPO | Attending: Gynecologic Oncology | Admitting: Gynecologic Oncology

## 2019-01-06 ENCOUNTER — Other Ambulatory Visit: Payer: Self-pay

## 2019-01-06 VITALS — BP 154/63 | HR 72 | Temp 98.7°F | Resp 17 | Ht 58.75 in | Wt 135.0 lb

## 2019-01-06 DIAGNOSIS — Z90710 Acquired absence of both cervix and uterus: Secondary | ICD-10-CM | POA: Diagnosis not present

## 2019-01-06 DIAGNOSIS — C541 Malignant neoplasm of endometrium: Secondary | ICD-10-CM

## 2019-01-06 DIAGNOSIS — Z9012 Acquired absence of left breast and nipple: Secondary | ICD-10-CM | POA: Insufficient documentation

## 2019-01-06 DIAGNOSIS — N890 Mild vaginal dysplasia: Secondary | ICD-10-CM

## 2019-01-06 DIAGNOSIS — Z08 Encounter for follow-up examination after completed treatment for malignant neoplasm: Secondary | ICD-10-CM

## 2019-01-06 DIAGNOSIS — I10 Essential (primary) hypertension: Secondary | ICD-10-CM | POA: Insufficient documentation

## 2019-01-06 DIAGNOSIS — Z853 Personal history of malignant neoplasm of breast: Secondary | ICD-10-CM | POA: Insufficient documentation

## 2019-01-06 DIAGNOSIS — Z90722 Acquired absence of ovaries, bilateral: Secondary | ICD-10-CM | POA: Insufficient documentation

## 2019-01-06 DIAGNOSIS — Z923 Personal history of irradiation: Secondary | ICD-10-CM | POA: Insufficient documentation

## 2019-01-06 DIAGNOSIS — Z9221 Personal history of antineoplastic chemotherapy: Secondary | ICD-10-CM | POA: Insufficient documentation

## 2019-01-06 DIAGNOSIS — Z8542 Personal history of malignant neoplasm of other parts of uterus: Secondary | ICD-10-CM

## 2019-01-06 NOTE — Patient Instructions (Signed)
Follow-up with Dr. Quincy Simmonds in 2021 Follow -up with Gyn Onc as indicated   Thank you very much Ms. Konrad Dolores for allowing me to provide care for you today.  I appreciate your confidence in choosing our Gynecologic Oncology team.  If you have any questions about your visit today please call our office and we will get back to you as soon as possible.  Please consider using the website Medlineplus.gov as an Geneticist, molecular.   Francetta Found. Max Romano MD., PhD Gynecologic Oncology \

## 2019-01-13 DIAGNOSIS — Z853 Personal history of malignant neoplasm of breast: Secondary | ICD-10-CM | POA: Diagnosis not present

## 2019-01-13 DIAGNOSIS — K219 Gastro-esophageal reflux disease without esophagitis: Secondary | ICD-10-CM | POA: Diagnosis not present

## 2019-01-13 DIAGNOSIS — Z8542 Personal history of malignant neoplasm of other parts of uterus: Secondary | ICD-10-CM | POA: Diagnosis not present

## 2019-01-13 DIAGNOSIS — Z86010 Personal history of colonic polyps: Secondary | ICD-10-CM | POA: Diagnosis not present

## 2019-01-17 ENCOUNTER — Encounter: Payer: Self-pay | Admitting: Podiatry

## 2019-01-17 ENCOUNTER — Ambulatory Visit (INDEPENDENT_AMBULATORY_CARE_PROVIDER_SITE_OTHER): Payer: PPO | Admitting: Podiatry

## 2019-01-17 ENCOUNTER — Other Ambulatory Visit: Payer: Self-pay

## 2019-01-17 DIAGNOSIS — Q828 Other specified congenital malformations of skin: Secondary | ICD-10-CM | POA: Diagnosis not present

## 2019-01-17 DIAGNOSIS — B351 Tinea unguium: Secondary | ICD-10-CM | POA: Diagnosis not present

## 2019-01-17 DIAGNOSIS — M79676 Pain in unspecified toe(s): Secondary | ICD-10-CM

## 2019-01-18 NOTE — Progress Notes (Signed)
Subjective: 78 y.o. returns the office today for painful, elongated, thickened toenails which she is unable to trim herself as well as for callus to her 2nd toe.  She states her right foot pain is almost completely resolved.  No swelling or any new issues otherwise.  Denies any systemic complaints such as fevers, chills, nausea, vomiting.   Objective: AAO 3, NAD DP/PT pulses palpable, CRT less than 3 seconds Nails hypertrophic, dystrophic, elongated, brittle, discolored 10. There is tenderness overlying the nails 1-5 bilaterally. There is no surrounding erythema or drainage along the nail sites. Hyperkeratotic lesions of the distal aspect left second digit. No underlying ulceration, drainage or other signs of infection.  Minimal hyperkeratotic lesion otherwise Tenderness in the right foot is resolved.  No specific area pinpoint tenderness there is no edema, erythema. No open lesions or pre-ulcerative lesions are identified. No pain with calf compression, swelling, warmth, erythema.  Assessment: Patient presents with symptomatic onychomycosis; hyperkeratotic lesions  Plan: -Treatment options including alternatives, risks, complications were discussed -Nails sharply debrided 10 without complication/bleeding. -Hyperkeratotic lesion sharply debrided 1 without complication/bleeding. -Follow-up in 4 weeks at her request or sooner if any problems are to arise. In the meantime, encouraged to call the office with any questions, concerns, changes symptoms.  Celesta Gentile, DPM

## 2019-02-14 ENCOUNTER — Ambulatory Visit: Payer: PPO | Admitting: Podiatry

## 2019-02-16 DIAGNOSIS — E7800 Pure hypercholesterolemia, unspecified: Secondary | ICD-10-CM | POA: Diagnosis not present

## 2019-02-17 DIAGNOSIS — Z1159 Encounter for screening for other viral diseases: Secondary | ICD-10-CM | POA: Diagnosis not present

## 2019-02-22 DIAGNOSIS — D124 Benign neoplasm of descending colon: Secondary | ICD-10-CM | POA: Diagnosis not present

## 2019-02-22 DIAGNOSIS — K5520 Angiodysplasia of colon without hemorrhage: Secondary | ICD-10-CM | POA: Diagnosis not present

## 2019-02-22 DIAGNOSIS — Z86010 Personal history of colonic polyps: Secondary | ICD-10-CM | POA: Diagnosis not present

## 2019-02-22 DIAGNOSIS — K5730 Diverticulosis of large intestine without perforation or abscess without bleeding: Secondary | ICD-10-CM | POA: Diagnosis not present

## 2019-03-01 DIAGNOSIS — D124 Benign neoplasm of descending colon: Secondary | ICD-10-CM | POA: Diagnosis not present

## 2019-03-03 ENCOUNTER — Ambulatory Visit: Payer: PPO | Admitting: Podiatry

## 2019-03-10 ENCOUNTER — Ambulatory Visit: Payer: PPO | Admitting: Podiatry

## 2019-03-10 ENCOUNTER — Encounter: Payer: Self-pay | Admitting: Podiatry

## 2019-03-10 ENCOUNTER — Other Ambulatory Visit: Payer: Self-pay

## 2019-03-10 DIAGNOSIS — B351 Tinea unguium: Secondary | ICD-10-CM

## 2019-03-10 DIAGNOSIS — M79676 Pain in unspecified toe(s): Secondary | ICD-10-CM

## 2019-03-10 DIAGNOSIS — Q828 Other specified congenital malformations of skin: Secondary | ICD-10-CM | POA: Diagnosis not present

## 2019-03-15 DIAGNOSIS — Z79899 Other long term (current) drug therapy: Secondary | ICD-10-CM | POA: Diagnosis not present

## 2019-03-15 DIAGNOSIS — I10 Essential (primary) hypertension: Secondary | ICD-10-CM | POA: Diagnosis not present

## 2019-03-15 DIAGNOSIS — Z23 Encounter for immunization: Secondary | ICD-10-CM | POA: Diagnosis not present

## 2019-03-21 NOTE — Progress Notes (Signed)
Subjective: 78 y.o. returns the office today for painful, elongated, thickened toenails which she is unable to trim herself as well as for callus to her 2nd toe. No swelling or any new issues otherwise.  Denies any systemic complaints such as fevers, chills, nausea, vomiting.   Objective: AAO 3, NAD DP/PT pulses palpable, CRT less than 3 seconds Nails hypertrophic, dystrophic, elongated, brittle, discolored 10. There is tenderness overlying the nails 1-5 bilaterally. There is no surrounding erythema or drainage along the nail sites. Hyperkeratotic lesions of the distal aspect left second digit. No underlying ulceration, drainage or other signs of infection.  Minimal hyperkeratotic lesion otherwise No open lesions or pre-ulcerative lesions are identified. No pain with calf compression, swelling, warmth, erythema.  Assessment: Patient presents with symptomatic onychomycosis; hyperkeratotic lesions  Plan: -Treatment options including alternatives, risks, complications were discussed -Nails sharply debrided 10 without complication/bleeding. -Hyperkeratotic lesion sharply debrided 1 without complication/bleeding. -Follow-up in 4 weeks at her request or sooner if any problems are to arise. In the meantime, encouraged to call the office with any questions, concerns, changes symptoms.  Celesta Gentile, DPM

## 2019-04-20 ENCOUNTER — Ambulatory Visit: Payer: PPO | Attending: Internal Medicine

## 2019-04-20 DIAGNOSIS — Z23 Encounter for immunization: Secondary | ICD-10-CM | POA: Insufficient documentation

## 2019-04-20 NOTE — Progress Notes (Signed)
   Covid-19 Vaccination Clinic  Name:  Beth Simon    MRN: [id number] DOB: 1941/03/01  04/20/2019  Beth Simon was observed post Covid-19 immunization for 15 minutes without incidence. She was provided with Vaccine Information Sheet and instruction to access the V-Safe system.   Beth Simon was instructed to call 911 with any severe reactions post vaccine: Marland Kitchen Difficulty breathing  . Swelling of your face and throat  . A fast heartbeat  . A bad rash all over your body  . Dizziness and weakness    Immunizations Administered    Name Date Dose VIS Date Route   Pfizer COVID-19 Vaccine 04/20/2019  9:59 AM 0.3 mL 03/11/2019 Intramuscular   Manufacturer: [employer]   Lot: [id number]   [place]: [id number]

## 2019-04-21 ENCOUNTER — Ambulatory Visit: Payer: PPO | Admitting: Podiatry

## 2019-04-21 ENCOUNTER — Other Ambulatory Visit: Payer: Self-pay

## 2019-04-21 DIAGNOSIS — M79676 Pain in unspecified toe(s): Secondary | ICD-10-CM | POA: Diagnosis not present

## 2019-04-21 DIAGNOSIS — Q828 Other specified congenital malformations of skin: Secondary | ICD-10-CM

## 2019-04-21 DIAGNOSIS — B351 Tinea unguium: Secondary | ICD-10-CM | POA: Diagnosis not present

## 2019-04-26 NOTE — Progress Notes (Signed)
Subjective: 79 y.o. returns the office today for painful, elongated, thickened toenails which she is unable to trim herself as well as for callus to her 2nd toe. No swelling or any new issues otherwise.  Denies any systemic complaints such as fevers, chills, nausea, vomiting.   Objective: AAO 3, NAD DP/PT pulses palpable, CRT less than 3 seconds Nails hypertrophic, dystrophic, elongated, brittle, discolored 10. There is tenderness overlying the nails 1-5 bilaterally. There is no surrounding erythema or drainage along the nail sites. Hyperkeratotic lesions of the distal aspect left second digit. No underlying ulceration, drainage or other signs of infection.  Minimal hyperkeratotic lesion otherwise No open lesions or other pre-ulcerative lesions are identified. No pain with calf compression, swelling, warmth, erythema.  Assessment: Symptomatic onychomycosis; hyperkeratotic lesion left foot  Plan: -Treatment options including alternatives, risks, complications were discussed -Nails sharply debrided 10 without complication/bleeding. -Hyperkeratotic lesion sharply debrided 1 without complication/bleeding. -Follow-up in 4 weeks at her request or sooner if any problems are to arise. In the meantime, encouraged to call the office with any questions, concerns, changes symptoms.  Celesta Gentile, DPM

## 2019-05-08 ENCOUNTER — Ambulatory Visit: Payer: PPO | Attending: Internal Medicine

## 2019-05-08 DIAGNOSIS — Z23 Encounter for immunization: Secondary | ICD-10-CM | POA: Insufficient documentation

## 2019-05-08 NOTE — Progress Notes (Signed)
   Covid-19 Vaccination Clinic  Name:  Beth Simon    MRN: [id number] DOB: Aug 21, 1940  05/08/2019  Ms. Cousin was observed post Covid-19 immunization for 15 minutes without incidence. She was provided with Vaccine Information Sheet and instruction to access the V-Safe system.   [name] was instructed to call 911 with any severe reactions post vaccine: Marland Kitchen Difficulty breathing  . Swelling of your face and throat  . A fast heartbeat  . A bad rash all over your body  . Dizziness and weakness    Immunizations Administered    Name Date Dose VIS Date Route   Pfizer COVID-19 Vaccine 05/08/2019 11:26 AM 0.3 mL 03/11/2019 Intramuscular   Manufacturer: [place]   Lot: [id number]   [place]: [id number]

## 2019-05-09 ENCOUNTER — Ambulatory Visit: Payer: PPO

## 2019-05-19 ENCOUNTER — Ambulatory Visit: Payer: PPO | Admitting: Podiatry

## 2019-05-25 DIAGNOSIS — R5383 Other fatigue: Secondary | ICD-10-CM | POA: Diagnosis not present

## 2019-05-25 DIAGNOSIS — E559 Vitamin D deficiency, unspecified: Secondary | ICD-10-CM | POA: Diagnosis not present

## 2019-05-25 DIAGNOSIS — M810 Age-related osteoporosis without current pathological fracture: Secondary | ICD-10-CM | POA: Diagnosis not present

## 2019-05-31 DIAGNOSIS — M810 Age-related osteoporosis without current pathological fracture: Secondary | ICD-10-CM | POA: Diagnosis not present

## 2019-05-31 DIAGNOSIS — M1611 Unilateral primary osteoarthritis, right hip: Secondary | ICD-10-CM | POA: Diagnosis not present

## 2019-06-07 ENCOUNTER — Ambulatory Visit: Payer: PPO | Admitting: Podiatry

## 2019-06-07 ENCOUNTER — Other Ambulatory Visit: Payer: Self-pay

## 2019-06-07 DIAGNOSIS — Q828 Other specified congenital malformations of skin: Secondary | ICD-10-CM

## 2019-06-07 DIAGNOSIS — B351 Tinea unguium: Secondary | ICD-10-CM

## 2019-06-07 DIAGNOSIS — M79676 Pain in unspecified toe(s): Secondary | ICD-10-CM

## 2019-06-07 NOTE — Progress Notes (Signed)
Subjective: 79 y.o. returns the office today for painful, elongated, thickened toenails which she is unable to trim herself as well as for callus to her 2nd toe. She gets some occasional pain to the top of the right foot after walking. No pain with walking but later in the day is when she will notice some pain. It does not last. No recent injury. No swelling or any new issues otherwise.  Denies any systemic complaints such as fevers, chills, nausea, vomiting.   Objective: AAO 3, NAD DP/PT pulses palpable, CRT less than 3 seconds Nails hypertrophic, dystrophic, elongated, brittle, discolored 10. There is tenderness overlying the nails 1-5 bilaterally. There is no surrounding erythema or drainage along the nail sites. Hyperkeratotic lesions of the distal aspect left second digit. No underlying ulceration, drainage or other signs of infection.  Bone spur present on dorsal medial aspect of the right midfoot on Lisfranc joint.  Upon palpation was paged when she gets some discomfort in this area after walking but no pain today.  There is no edema, erythema.  Flexor, extensor tendons appear to be intact.  Minimal hyperkeratotic lesion otherwise No open lesions or other pre-ulcerative lesions are identified. No pain with calf compression, swelling, warmth, erythema.  Assessment: Symptomatic onychomycosis; hyperkeratotic lesion left foot  Plan: -Treatment options including alternatives, risks, complications were discussed -Nails sharply debrided 10 without complication/bleeding. -Hyperkeratotic lesion sharply debrided 1 without complication/bleeding. -I do believe that her midfoot pain is a result of arthritis.  Discussed wearing more supportive shoes around the house.  Continue with support walking. -Follow-up in 4 weeks at her request or sooner if any problems are to arise. In the meantime, encouraged to call the office with any questions, concerns, changes symptoms.  *If still having midfoot  pain right side next appointment repeat x-rays  Celesta Gentile, DPM

## 2019-07-05 ENCOUNTER — Other Ambulatory Visit: Payer: Self-pay

## 2019-07-05 ENCOUNTER — Encounter: Payer: Self-pay | Admitting: Podiatry

## 2019-07-05 ENCOUNTER — Ambulatory Visit: Payer: PPO | Admitting: Podiatry

## 2019-07-05 VITALS — Temp 96.7°F

## 2019-07-05 DIAGNOSIS — M79676 Pain in unspecified toe(s): Secondary | ICD-10-CM

## 2019-07-05 DIAGNOSIS — Q828 Other specified congenital malformations of skin: Secondary | ICD-10-CM | POA: Diagnosis not present

## 2019-07-05 DIAGNOSIS — B351 Tinea unguium: Secondary | ICD-10-CM

## 2019-07-13 DIAGNOSIS — D2272 Melanocytic nevi of left lower limb, including hip: Secondary | ICD-10-CM | POA: Diagnosis not present

## 2019-07-13 DIAGNOSIS — D2239 Melanocytic nevi of other parts of face: Secondary | ICD-10-CM | POA: Diagnosis not present

## 2019-07-13 DIAGNOSIS — D225 Melanocytic nevi of trunk: Secondary | ICD-10-CM | POA: Diagnosis not present

## 2019-07-13 DIAGNOSIS — Z79899 Other long term (current) drug therapy: Secondary | ICD-10-CM | POA: Diagnosis not present

## 2019-07-13 DIAGNOSIS — L814 Other melanin hyperpigmentation: Secondary | ICD-10-CM | POA: Diagnosis not present

## 2019-07-13 DIAGNOSIS — L570 Actinic keratosis: Secondary | ICD-10-CM | POA: Diagnosis not present

## 2019-07-13 DIAGNOSIS — L603 Nail dystrophy: Secondary | ICD-10-CM | POA: Diagnosis not present

## 2019-07-13 DIAGNOSIS — L821 Other seborrheic keratosis: Secondary | ICD-10-CM | POA: Diagnosis not present

## 2019-07-17 NOTE — Progress Notes (Signed)
Subjective: 79 y.o. returns the office today for painful, elongated, thickened toenails which she is unable to trim herself as well as for callus to her 2nd toe.  She has no new concerns today.  Denies any redness or drainage or any swelling. Denies any systemic complaints such as fevers, chills, nausea, vomiting.   Objective: AAO 3, NAD DP/PT pulses palpable, CRT less than 3 seconds Nails mildly hypertrophic, dystrophic, elongated, brittle, discolored 10. There is tenderness overlying the nails 1-5 bilaterally. There is no surrounding erythema or drainage along the nail sites. Hyperkeratotic lesions of the distal aspect left second digit. No underlying ulceration, drainage or other signs of infection.  No significant pain of the midfoot there is no edema, erythema.  She is able to do her walking without any significant issues. No open lesions or other pre-ulcerative lesions are identified. No pain with calf compression, swelling, warmth, erythema.  Assessment: Symptomatic onychomycosis; hyperkeratotic lesion left foot  Plan: -Treatment options including alternatives, risks, complications were discussed -Nails sharply debrided 10 without complication/bleeding. -Hyperkeratotic lesion sharply debrided 1 without complication/bleeding. -Follow-up in 4 weeks at her request or sooner if any problems are to arise. In the meantime, encouraged to call the office with any questions, concerns, changes symptoms.  Celesta Gentile, DPM

## 2019-08-02 ENCOUNTER — Ambulatory Visit: Payer: PPO | Admitting: Podiatry

## 2019-08-09 DIAGNOSIS — D649 Anemia, unspecified: Secondary | ICD-10-CM | POA: Diagnosis not present

## 2019-08-15 ENCOUNTER — Ambulatory Visit: Payer: PPO | Admitting: Podiatry

## 2019-08-15 ENCOUNTER — Other Ambulatory Visit: Payer: Self-pay

## 2019-08-15 DIAGNOSIS — Q828 Other specified congenital malformations of skin: Secondary | ICD-10-CM

## 2019-08-15 DIAGNOSIS — M79676 Pain in unspecified toe(s): Secondary | ICD-10-CM

## 2019-08-15 DIAGNOSIS — B351 Tinea unguium: Secondary | ICD-10-CM

## 2019-08-20 NOTE — Progress Notes (Signed)
Subjective: 79 y.o. returns the office today for painful, elongated, thickened toenails which she is unable to trim herself as well as for callus to her 2nd toe.  Denies any swelling or redness or any drainage.  She has no new concerns today. Denies any systemic complaints such as fevers, chills, nausea, vomiting.   Objective: AAO 3, NAD DP/PT pulses palpable, CRT less than 3 seconds Nails mildly hypertrophic, dystrophic, elongated, brittle, discolored 10. There is tenderness overlying the nails 1-5 bilaterally. There is no surrounding erythema or drainage along the nail sites. Hyperkeratotic lesions of the distal aspect left second digit. No underlying ulceration, drainage or other signs of infection.  No open lesions or other pre-ulcerative lesions are identified. No pain with calf compression, swelling, warmth, erythema.  Assessment: Symptomatic onychomycosis; hyperkeratotic lesion left foot  Plan: -Treatment options including alternatives, risks, complications were discussed -Nails sharply debrided 10 without complication/bleeding. -Hyperkeratotic lesion sharply debrided 1 without complication/bleeding. -Follow-up in 4 weeks at her request or sooner if any problems are to arise. In the meantime, encouraged to call the office with any questions, concerns, changes symptoms.  Celesta Gentile, DPM

## 2019-08-24 ENCOUNTER — Encounter: Payer: Self-pay | Admitting: Obstetrics and Gynecology

## 2019-08-24 ENCOUNTER — Ambulatory Visit (INDEPENDENT_AMBULATORY_CARE_PROVIDER_SITE_OTHER): Payer: PPO | Admitting: Obstetrics and Gynecology

## 2019-08-24 ENCOUNTER — Ambulatory Visit: Payer: PPO | Admitting: Obstetrics and Gynecology

## 2019-08-24 ENCOUNTER — Other Ambulatory Visit: Payer: Self-pay

## 2019-08-24 ENCOUNTER — Telehealth: Payer: Self-pay | Admitting: Obstetrics and Gynecology

## 2019-08-24 ENCOUNTER — Other Ambulatory Visit (HOSPITAL_COMMUNITY)
Admission: RE | Admit: 2019-08-24 | Discharge: 2019-08-24 | Disposition: A | Discharge status: Home / Self Care | Payer: PPO | Source: Ambulatory Visit | Attending: Obstetrics and Gynecology | Admitting: Obstetrics and Gynecology

## 2019-08-24 VITALS — BP 124/68 | HR 84 | Temp 97.0°F | Resp 16 | Ht <= 58 in | Wt 137.6 lb

## 2019-08-24 DIAGNOSIS — Z8542 Personal history of malignant neoplasm of other parts of uterus: Secondary | ICD-10-CM | POA: Diagnosis not present

## 2019-08-24 DIAGNOSIS — Z90710 Acquired absence of both cervix and uterus: Secondary | ICD-10-CM | POA: Diagnosis not present

## 2019-08-24 DIAGNOSIS — Z923 Personal history of irradiation: Secondary | ICD-10-CM | POA: Insufficient documentation

## 2019-08-24 DIAGNOSIS — R32 Unspecified urinary incontinence: Secondary | ICD-10-CM

## 2019-08-24 DIAGNOSIS — Z01419 Encounter for gynecological examination (general) (routine) without abnormal findings: Secondary | ICD-10-CM

## 2019-08-24 DIAGNOSIS — Z1151 Encounter for screening for human papillomavirus (HPV): Secondary | ICD-10-CM | POA: Insufficient documentation

## 2019-08-24 DIAGNOSIS — R159 Full incontinence of feces: Secondary | ICD-10-CM

## 2019-08-24 NOTE — Progress Notes (Signed)
79 y.o. G79P1001 Widowed Caucasian female here for annual exam.    Just celebrated her 79th birthday.   Reports decreased appetite.  She wants help with this.  She needs a prescription prosthetic bras.   No pelvic discomfort.  No vaginal bleeding.   Denies dysuria, blood in the urine.  She can have some urinary leakage with a cough or sneeze.  She continues to have fecal incontinence and fecal urgency.  She is not ready to have a consultation for this at [facility] with a colon and rectal surgeon.  She has doing pelvic floor PT with Earlie Counts.   She continues to use a vaginal dilators.  She thinks she needs a replacement kit.  She did not bring it with her.   Received her Covid vaccine.   PCP: Lajean Manes, MD    Patient's last menstrual period was 03/31/1988 (approximate).           Sexually active: No.  The current method of family planning is status post hysterectomy--Hx of uterine cancer.    Exercising: Yes.    walks 45 minutes--2 days/week Smoker:  no  Health Maintenance: Pap: 08-20-18 Neg:Neg HR HPV, 06-03-17 Neg:Neg HR ZOX,0960 Neg:Pos HR HPV History of abnormal Pap:  Yes, 05-16-16 Neg:Pos HR HPV with vaginal biopsies from colpo showing VAIN I MMG: Bilateral mastectomy Colonoscopy: 2017 polyps--no further studies needed. BMD: 05-05-18 Result :Osteoporosis TDaP:  12-23-07 Gardasil:   no AVW:[id number] blood in past Hep C:donated blood in past Screening Labs:  PCP.    reports that she has never smoked. She has never used smokeless tobacco. She reports that she does not drink alcohol or use drugs.  Past Medical History:  Diagnosis Date  . Adenomatous polyps 01/2016  . Anemia   . Breast cancer ([place]) 1990   bilateral mastectomy, adenoca breast-left MRM, reconstruction, chemo  . Bronchitis last 2 weeks   saw dr Felipa Eth 02-28-2014, he said no antibiotics needed, nonproductive cough  . Complication of anesthesia   . Cystitis    cytoxen, had once or twice  . Family  history of breast cancer   . History of breast cancer   . History of radiation therapy 2/10, 2/12, 2/18, 2/24, 05/29/14   vaginal cuff/ 30 Gy/ 5 fx  . Hypertension   . Macular degeneration   . Neck pain    taking physictal therapy last 2 weeks  . Osteoporosis    On Prolia.  Marland Kitchen PONV (postoperative nausea and vomiting)   . Shoulder pain    taking physical therapy for last few weeks  . Uterine cancer ([place]) 03/21/2014   MLH1/PMS2 LOH  . Uterine fibroid   . VAIN I (vaginal intraepithelial neoplasia grade I) 2018   positive HR HPV.  . Vasovagal syncopes     Past Surgical History:  Procedure Laterality Date  . BREAST IMPLANT REMOVAL  6/94   left breast  . BREAST RECONSTRUCTION  1990   left  . CATARACT EXTRACTION Bilateral   . EXCISION VAGINAL CYST    . MASTECTOMY Right 10/08/06   prophylactic  . RADICAL MASTECTOMY LND  1990   left, chemo done  . ROBOTIC ASSISTED TOTAL HYSTERECTOMY WITH BILATERAL SALPINGO OOPHERECTOMY Bilateral 03/21/2014   Procedure: ROBOTIC ASSISTED TOTAL HYSTERECTOMY WITH BILATERAL SALPINGO OOPHORECTOMY ;  Surgeon: Janie Morning, MD;  Location: [facility];  Service: Gynecology;  Laterality: Bilateral;    Current Outpatient Medications  Medication Sig Dispense Refill  . atorvastatin (LIPITOR) 10 MG tablet Take 10 mg by mouth daily.    Marland Kitchen  Coenzyme Q10 (COQ10 PO) Take 1 tablet by mouth every morning.     . Cyanocobalamin (VITAMIN B 12 PO) Take 1 tablet by mouth every morning.     . denosumab (PROLIA) 60 MG/ML SOLN injection Inject 60 mg into the skin every 6 (six) months. Administer in upper arm, thigh, or abdomen    . famotidine (PEPCID) 40 MG tablet Take 40 mg by mouth at bedtime as needed.    . Flaxseed, Linseed, (FLAX SEEDS PO) Take 1 scoop by mouth every morning. Ground flaxseed in cereal.    . lisinopril (PRINIVIL,ZESTRIL) 5 MG tablet Take 5 mg by mouth daily.    Marland Kitchen OVER THE COUNTER MEDICATION Take 1 tablet by mouth every morning. Astazanthin. (anti-oxidant)    .  SHINGRIX injection     . VITAMIN D, CHOLECALCIFEROL, PO Take 4,000 Int'l Units by mouth every morning.      No current facility-administered medications for this visit.    Family History  Problem Relation Age of Onset  . Breast cancer Sister 51       recurrence age 78  . Breast cancer Paternal Grandmother   . Heart disease Mother   . Heart disease Father   . Hypertension Sister   . Dementia Sister   . Breast cancer Maternal Aunt 68  . Dementia Maternal Aunt        dx in her 41s    Review of Systems  Gastrointestinal:       Fecal incontinence  All other systems reviewed and are negative.   Exam:   BP 124/68   Pulse 84   Temp (!) 97 F (36.1 C) (Temporal)   Resp 16   Ht '4\' 10"'$  (1.473 m)   Wt 137 lb 9.6 oz (62.4 kg)   LMP 03/31/1988 (Approximate)   BMI 28.76 kg/m     General appearance: alert, cooperative and appears stated age Head: normocephalic, without obvious abnormality, atraumatic Neck: no adenopathy, supple, symmetrical, trachea midline and thyroid normal to inspection and palpation Lungs: clear to auscultation bilaterally Breasts: normal appearance, no masses or tenderness, No nipple retraction or dimpling, No nipple discharge or bleeding, No axillary adenopathy Heart: regular rate and rhythm Abdomen: soft, non-tender; no masses, no organomegaly Extremities: extremities normal, atraumatic, no cyanosis or edema Skin: skin color, texture, turgor normal. No rashes or lesions Lymph nodes: cervical, supraclavicular, and axillary nodes normal. Neurologic: grossly normal  Pelvic: External genitalia:  no lesions              No abnormal inguinal nodes palpated.              Urethra:  normal appearing urethra with no masses, tenderness or lesions              Bartholins and Skenes: normal                 Vagina: normal appearing vagina with normal color and discharge, no lesions              Cervix: absent              Pap taken: Yes.   Bimanual Exam:  Uterus:   absent              Adnexa: no mass, fullness, tenderness              Rectal exam: Yes.  .  Confirms.              Anus:  normal sphincter  tone, no lesions  Chaperone was present for exam.  Assessment:   Well woman visit with normal exam. Status post robotic hysterectomy with BSO.  Status post vaginal brachytherapy. Pap smear 05/16/16 - negative and positive HR HPV.  Colposcopy with vaginal biopsy 06/02/16 - VAIN I.  Urinary incontinence. Fecal soiling. Chronic.  Status post bilateral mastectomy for breast cancer.  Osteoporosis. On Prolia.  Plan:  Pap and HR HPV as above. Guidelines for Calcium, Vitamin D, regular exercise program including cardiovascular and weight bearing exercise. Will refer to [place] for urinary and fecal incontinence. I recommend she contact her PCP and ask for assistance for nutrition counseling.  Rx for prosthetic bras 2 with 2 refills.  Return for evaluation of vaginal dilators. Follow up annually and prn.   After visit summary provided.

## 2019-08-24 NOTE — Telephone Encounter (Signed)
Please place a referral to Austin Miles, pelvic floor physical therapist for urinary and fecal incontinence.   Patient is a returning patient and has had radiation therapy to the pelvis for endometrial cancer.

## 2019-08-24 NOTE — Telephone Encounter (Signed)
Dr. Quincy Simmonds sent this message to me.  Please place a referral to Beth Simon, pelvic floor physical therapist for urinary and fecal incontinence.   Patient is a returning patient and has had radiation therapy to the pelvis for endometrial cancer.

## 2019-08-24 NOTE — Patient Instructions (Signed)

## 2019-08-24 NOTE — Telephone Encounter (Signed)
Referral placed to Earlie Counts, PT per Dr Quincy Simmonds.   Routing to [place] for assistance for referral.

## 2019-08-25 ENCOUNTER — Encounter: Payer: Self-pay | Admitting: Obstetrics and Gynecology

## 2019-08-25 ENCOUNTER — Ambulatory Visit: Payer: PPO | Admitting: Obstetrics and Gynecology

## 2019-08-25 VITALS — BP 150/74 | HR 80 | Temp 97.2°F | Resp 70 | Ht 58.75 in | Wt 138.0 lb

## 2019-08-25 DIAGNOSIS — N895 Stricture and atresia of vagina: Secondary | ICD-10-CM

## 2019-08-25 DIAGNOSIS — Z8542 Personal history of malignant neoplasm of other parts of uterus: Secondary | ICD-10-CM | POA: Diagnosis not present

## 2019-08-25 NOTE — Progress Notes (Signed)
GYNECOLOGY  VISIT   HPI: 79 y.o.   Widowed  Caucasian  female   G1P1001 with Patient's last menstrual period was 03/31/1988 (approximate).   here for follow up.    Patient returns with her vaginal dilators today to assess if she needs a new one. She uses an extra small dilator. She has had treatment for endometrial cancer and uses the dilators to maintain patency of the vagina.  She is able to complete a speculum exam in the office using a pediatric speculum.  GYNECOLOGIC HISTORY: Patient's last menstrual period was 03/31/1988 (approximate). Contraception: Hyst--Hx uterine cancer Menopausal hormone therapy:  None Last mammogram: Bilateral Mastectomy Last pap smear:08-24-19 pend., 08-20-18 Neg:Neg HR HPV, 06-03-17 Neg:Neg HR HPV,2018 Neg:Pos HR HPV        OB History    Gravida  1   Para  1   Term  1   Preterm  0   AB  0   Living  1     SAB  0   TAB  0   Ectopic  0   Multiple  0   Live Births  1              Patient Active Problem List   Diagnosis Date Noted  . Transient cerebral ischemia 09/15/2017  . Pure hypercholesterolemia 09/15/2017  . Pulmonary nodule 09/15/2017  . Hypertension 09/15/2017  . Gastro-esophageal reflux disease without esophagitis 09/15/2017  . VAIN I (vaginal intraepithelial neoplasia grade I) 06/23/2017  . Genetic testing 05/23/2014  . History of breast cancer   . Family history of breast cancer   . Endometrial cancer ([place]) 03/21/2014  . Uterine cancer ([place]) 03/21/2014  . S/P mastectomy, bilateral 05/02/2013  . Situational stress 05/02/2013  . Osteoporosis, unspecified 05/02/2013  . Breast cancer, left breast ([place]) 05/02/2013    Past Medical History:  Diagnosis Date  . Adenomatous polyps 01/2016  . Anemia   . Breast cancer ([place]) 1990   bilateral mastectomy, adenoca breast-left MRM, reconstruction, chemo  . Bronchitis last 2 weeks   saw dr Felipa Eth 02-28-2014, he said no antibiotics needed, nonproductive cough  . Complication  of anesthesia   . Cystitis    cytoxen, had once or twice  . Family history of breast cancer   . History of breast cancer   . History of radiation therapy 2/10, 2/12, 2/18, 2/24, 05/29/14   vaginal cuff/ 30 Gy/ 5 fx  . Hypertension   . Macular degeneration   . Neck pain    taking physictal therapy last 2 weeks  . Osteoporosis    On Prolia.  Marland Kitchen PONV (postoperative nausea and vomiting)   . Shoulder pain    taking physical therapy for last few weeks  . Uterine cancer ([place]) 03/21/2014   MLH1/PMS2 LOH  . Uterine fibroid   . VAIN I (vaginal intraepithelial neoplasia grade I) 2018   positive HR HPV.  . Vasovagal syncopes     Past Surgical History:  Procedure Laterality Date  . BREAST IMPLANT REMOVAL  6/94   left breast  . BREAST RECONSTRUCTION  1990   left  . CATARACT EXTRACTION Bilateral   . EXCISION VAGINAL CYST    . MASTECTOMY Right 10/08/06   prophylactic  . RADICAL MASTECTOMY LND  1990   left, chemo done  . ROBOTIC ASSISTED TOTAL HYSTERECTOMY WITH BILATERAL SALPINGO OOPHERECTOMY Bilateral 03/21/2014   Procedure: ROBOTIC ASSISTED TOTAL HYSTERECTOMY WITH BILATERAL SALPINGO OOPHORECTOMY ;  Surgeon: Janie Morning, MD;  Location: [facility];  Service:  Gynecology;  Laterality: Bilateral;    Current Outpatient Medications  Medication Sig Dispense Refill  . atorvastatin (LIPITOR) 10 MG tablet Take 10 mg by mouth daily.    . Coenzyme Q10 (COQ10 PO) Take 1 tablet by mouth every morning.     . Cyanocobalamin (VITAMIN B 12 PO) Take 1 tablet by mouth every morning.     . denosumab (PROLIA) 60 MG/ML SOLN injection Inject 60 mg into the skin every 6 (six) months. Administer in upper arm, thigh, or abdomen    . famotidine (PEPCID) 40 MG tablet Take 40 mg by mouth at bedtime as needed.    . Flaxseed, Linseed, (FLAX SEEDS PO) Take 1 scoop by mouth every morning. Ground flaxseed in cereal.    . lisinopril (PRINIVIL,ZESTRIL) 5 MG tablet Take 5 mg by mouth daily.    Marland Kitchen OVER THE COUNTER MEDICATION  Take 1 tablet by mouth every morning. Astazanthin. (anti-oxidant)    . SHINGRIX injection     . VITAMIN D, CHOLECALCIFEROL, PO Take 4,000 Int'l Units by mouth every morning.      No current facility-administered medications for this visit.     ALLERGIES: Prednisone and Novocain [procaine]  Family History  Problem Relation Age of Onset  . Breast cancer Sister 72       recurrence age 29  . Breast cancer Paternal Grandmother   . Heart disease Mother   . Heart disease Father   . Hypertension Sister   . Dementia Sister   . Breast cancer Maternal Aunt 68  . Dementia Maternal Aunt        dx in her 18s    Social History   Socioeconomic History  . Marital status: Widowed    Spouse name: Not on file  . Number of children: 1  . Years of education: Not on file  . Highest education level: Not on file  Occupational History  . Not on file  Tobacco Use  . Smoking status: Never Smoker  . Smokeless tobacco: Never Used  Substance and Sexual Activity  . Alcohol use: No  . Drug use: No  . Sexual activity: Not Currently    Partners: Male  Other Topics Concern  . Not on file  Social History Narrative  . Not on file   Social Determinants of Health   Financial Resource Strain:   . Difficulty of Paying Living Expenses:   Food Insecurity:   . Worried About Charity fundraiser in the Last Year:   . Arboriculturist in the Last Year:   Transportation Needs:   . Film/video editor (Medical):   Marland Kitchen Lack of Transportation (Non-Medical):   Physical Activity:   . Days of Exercise per Week:   . Minutes of Exercise per Session:   Stress:   . Feeling of Stress :   Social Connections:   . Frequency of Communication with Friends and Family:   . Frequency of Social Gatherings with Friends and Family:   . Attends Religious Services:   . Active Member of Clubs or Organizations:   . Attends Archivist Meetings:   Marland Kitchen Marital Status:   Intimate Partner Violence:   . Fear of Current  or Ex-Partner:   . Emotionally Abused:   Marland Kitchen Physically Abused:   . Sexually Abused:     Review of Systems  All other systems reviewed and are negative.   PHYSICAL EXAMINATION:    BP (!) 150/74   Pulse 80   Temp (!)  97.2 F (36.2 C) (Temporal)   Resp (!) 70   Ht 4' 10.75" (1.492 m)   Wt 138 lb (62.6 kg)   LMP 03/31/1988 (Approximate)   BMI 28.11 kg/m     General appearance: alert, cooperative and appears stated age   Dilators appear to be made of plastic.  I examined only the extra small one, which is the only one she uses. Good integrity.  No discoloration.  No sharp edges.   ASSESSMENT  Dilator appropriate for use.  Hx endometrial cancer.  Hx prior positive HR HPV.  PLAN  I recommend she continue with her current dilator, and not change to another.  Pap is pending.  Fu prn.    An After Visit Summary was printed and given to the patient.  ___15___ minutes face to face time of which over 50% was spent in counseling.

## 2019-08-26 LAB — CYTOLOGY - PAP
Comment: NEGATIVE
Diagnosis: REACTIVE
High risk HPV: NEGATIVE

## 2019-09-12 ENCOUNTER — Ambulatory Visit: Payer: PPO | Admitting: Podiatry

## 2019-10-06 ENCOUNTER — Encounter: Payer: Self-pay | Admitting: Physical Therapy

## 2019-10-06 ENCOUNTER — Ambulatory Visit: Payer: PPO | Attending: Obstetrics and Gynecology | Admitting: Physical Therapy

## 2019-10-06 ENCOUNTER — Other Ambulatory Visit: Payer: Self-pay

## 2019-10-06 DIAGNOSIS — M6281 Muscle weakness (generalized): Secondary | ICD-10-CM | POA: Insufficient documentation

## 2019-10-06 DIAGNOSIS — N3942 Incontinence without sensory awareness: Secondary | ICD-10-CM | POA: Diagnosis not present

## 2019-10-06 DIAGNOSIS — R278 Other lack of coordination: Secondary | ICD-10-CM

## 2019-10-06 DIAGNOSIS — R159 Full incontinence of feces: Secondary | ICD-10-CM | POA: Diagnosis not present

## 2019-10-06 NOTE — Patient Instructions (Addendum)
Slow Contraction: Gravity Eliminated (Side-Lying)    Lie on left side, hips and knees slightly bent. Slowly squeeze pelvic floor for _5__ seconds. Rest for __5_ seconds. Repeat __10_ times. Do _3__ times a day.   Copyright  VHI. All rights reserved.  Use coconut oil on the rectal and anal area.  [facility] [address], [place], [place] [address] Phone # [phone] Fax [phone]

## 2019-10-06 NOTE — Therapy (Signed)
[facility] [address], [place], [place], [address] Phone: [phone]   Fax:  [phone]  Physical Therapy Evaluation  Patient Details  Name: Beth Simon MRN: [id number] Date of Birth: 1941-03-17 Referring Provider (PT): Dr. Hosie Poisson   Encounter Date: 10/06/2019   PT End of Session - 10/06/19 0954    Visit Number 1    Date for PT Re-Evaluation 12/29/19    Authorization Type [employer]    PT Start Time 0930    PT Stop Time 1008    PT Time Calculation (min) 38 min    Activity Tolerance Patient tolerated treatment well    Behavior During Therapy [facility] for tasks assessed/performed           Past Medical History:  Diagnosis Date  . Adenomatous polyps 01/2016  . Anemia   . Breast cancer ([place]) 1990   bilateral mastectomy, adenoca breast-left MRM, reconstruction, chemo  . Bronchitis last 2 weeks   saw dr Felipa Eth 02-28-2014, he said no antibiotics needed, nonproductive cough  . Complication of anesthesia   . Cystitis    cytoxen, had once or twice  . Family history of breast cancer   . History of breast cancer   . History of radiation therapy 2/10, 2/12, 2/18, 2/24, 05/29/14   vaginal cuff/ 30 Gy/ 5 fx  . Hypertension   . Macular degeneration   . Neck pain    taking physictal therapy last 2 weeks  . Osteoporosis    On Prolia.  Marland Kitchen PONV (postoperative nausea and vomiting)   . Shoulder pain    taking physical therapy for last few weeks  . Uterine cancer ([place]) 03/21/2014   MLH1/PMS2 LOH  . Uterine fibroid   . VAIN I (vaginal intraepithelial neoplasia grade I) 2018   positive HR HPV.  . Vasovagal syncopes     Past Surgical History:  Procedure Laterality Date  . BREAST IMPLANT REMOVAL  6/94   left breast  . BREAST RECONSTRUCTION  1990   left  . CATARACT EXTRACTION Bilateral   . EXCISION VAGINAL CYST    . MASTECTOMY Right 10/08/06   prophylactic  . RADICAL MASTECTOMY LND  1990   left,  chemo done  . ROBOTIC ASSISTED TOTAL HYSTERECTOMY WITH BILATERAL SALPINGO OOPHERECTOMY Bilateral 03/21/2014   Procedure: ROBOTIC ASSISTED TOTAL HYSTERECTOMY WITH BILATERAL SALPINGO OOPHORECTOMY ;  Surgeon: Janie Morning, MD;  Location: [facility];  Service: Gynecology;  Laterality: Bilateral;    There were no vitals filed for this visit.    Subjective Assessment - 10/06/19 0934    Subjective Patient reports things have been getting worse since end of 2015 when she had an hysterectomy. The last 2 years has been getting worse. I have leakage and not as much urgency. When I am shoping I feel wet. The leakage happens during the night. I leaked stool at night and did not feel it. It happens 2x per week. I have more heartburn.    Patient Stated Goals reduce fecal and urinary leakage    Currently in Pain? No/denies    Multiple Pain Sites No              [facility] PT Assessment - 10/06/19 0001      Assessment   Medical Diagnosis R32 Urinary incontinence, unspecified type; R15.9 incontinence of feces, unspecified fecal incontinence    Referring Provider (PT) Dr. Hosie Poisson    Onset Date/Surgical Date 10/05/17    Prior Therapy  yes      Precautions   Precautions Other (comment)    Precaution Comments breast, uterine, endometrial cancer with radiation      Restrictions   Weight Bearing Restrictions No      Balance Screen   Has the patient fallen in the past 6 months No    Has the patient had a decrease in activity level because of a fear of falling?  No    Is the patient reluctant to leave their home because of a fear of falling?  No      Home Ecologist residence      Prior Function   Level of [place] Retired      Associate Professor   Overall Cognitive Status Within Functional Limits for tasks assessed      Posture/Postural Control   Posture/Postural Control No significant limitations      ROM / Strength   AROM / PROM /  Strength AROM;PROM;Strength      AROM   Overall AROM Comments lumbar full ROM      Strength   Right Hip ABduction 4-/5    Left Hip ABduction 4/5                      Objective measurements completed on examination: See above findings.     Pelvic Floor Special Questions - 10/06/19 0001    Prior Pregnancies Yes    Number of Pregnancies 1    Number of Vaginal Deliveries 1    Currently Sexually Active No    Urinary Leakage Yes    Pad use wears 1 thin liner per day , none at night and no underwear    Activities that cause leaking Coughing;Sneezing   small dribble   Urinary urgency Yes   fecal not urinary   Urinary frequency 1-2 BM per day, type 3-4     Fecal incontinence Yes    Falling out feeling (prolapse) No    Skin Integrity Intact    Pelvic Floor Internal Exam Patient confirms identification and approves PT to assess pelvic floor and treatment    Exam Type Rectal    Palpation tightness in the perineal body    Strength fair squeeze, definite lift    Strength # of reps 5    Strength # of seconds 3                    PT Education - 10/06/19 1005    Education Details pelvic floor contraction, using coconut oil    Person(s) Educated Patient    Methods Explanation;Demonstration;Verbal cues;Handout    Comprehension Returned demonstration;Verbalized understanding            PT Short Term Goals - 10/06/19 1013      PT SHORT TERM GOAL #1   Title be independent in initial HEP    Time 4    Period Weeks    Status New    Target Date 11/03/19      PT SHORT TERM GOAL #2   Title fecal leakage decreased >/= 25% due to improve strength and coordination    Time 4    Period Weeks    Status New    Target Date 11/03/19             PT Long Term Goals - 10/06/19 1014      PT LONG TERM GOAL #1   Title be independent in advanced  HEP    Time 12    Period Weeks    Status New    Target Date 12/29/19      PT LONG TERM GOAL #2   Title fecal leakage  decreased >/= 75% due to improved strength and endurance     Baseline --    Time 12    Period Weeks    Status New    Target Date 12/29/19      PT LONG TERM GOAL #3   Title ability to feel the leakage if it happens due to increased sensation of the anal sphincter    Baseline --    Time 12    Period Weeks    Status New    Target Date 12/29/19      PT LONG TERM GOAL #4   Title urinary leakage with coughing and sneezing decreased >/= 50%    Baseline --    Time 12    Period Weeks    Status New    Target Date 12/29/19                  Plan - 10/06/19 1008    Clinical Impression Statement Patient is a 79 year old female with urinary and fecal incontinence that is worse in the past 2 years. Patient will leak stool during the day and night without being aware of the leakage. She wears 1 light day pad during the day. Pelvic floor strength is 3/5 holding 3 seconds 5 times. The puborectalis does not go forward as much as it should. She has tightness in the perineal body. Bilateral hip abduction weakness. Patient will benefit from skilled therapy to improve pelvic floor strength and coordination to reduce fecal and urinary leakage.    Personal Factors and Comorbidities Age;Comorbidity 3+    Comorbidities breast cancer, endometrial cancer, urterin cancer, osteoporosis; hysterectomy    Examination-Activity Limitations Continence;Sleep;Toileting    Examination-Participation Restrictions Community Activity    Stability/Clinical Decision Making Evolving/Moderate complexity    Clinical Decision Making Moderate    Rehab Potential Good    PT Frequency 1x / week    PT Duration 12 weeks    PT Treatment/Interventions ADLs/Self Care Home Management;Biofeedback;Neuromuscular re-education;Therapeutic exercise;Therapeutic activities;Patient/family education;Manual techniques    PT Next Visit Plan pelvic floor contraction in sidly, quadruped, sitting, 1/2 kneel, abdominal massage    Consulted and  Agree with Plan of Care Patient           Patient will benefit from skilled therapeutic intervention in order to improve the following deficits and impairments:  Decreased coordination, Increased fascial restricitons, Decreased endurance, Decreased activity tolerance, Decreased strength  Visit Diagnosis: Muscle weakness (generalized) - Plan: PT plan of care cert/re-cert  Other lack of coordination - Plan: PT plan of care cert/re-cert  Incontinence of feces, unspecified fecal incontinence type - Plan: PT plan of care cert/re-cert  Urinary incontinence without sensory awareness - Plan: PT plan of care cert/re-cert     Problem List Patient Active Problem List   Diagnosis Date Noted  . Transient cerebral ischemia 09/15/2017  . Pure hypercholesterolemia 09/15/2017  . Pulmonary nodule 09/15/2017  . Hypertension 09/15/2017  . Gastro-esophageal reflux disease without esophagitis 09/15/2017  . VAIN I (vaginal intraepithelial neoplasia grade I) 06/23/2017  . Genetic testing 05/23/2014  . History of breast cancer   . Family history of breast cancer   . Endometrial cancer ([place]) 03/21/2014  . Uterine cancer ([place]) 03/21/2014  . S/P mastectomy, bilateral 05/02/2013  . Situational stress  05/02/2013  . Osteoporosis, unspecified 05/02/2013  . Breast cancer, left breast ([place]) 05/02/2013    Earlie Counts, PT 10/06/19 10:17 AM   [facility] [address] [place], [place], [address] Phone: [phone]   Fax:  [phone]  Name: Beth Simon MRN: [id number] Date of Birth: 1940-06-19

## 2019-10-10 ENCOUNTER — Ambulatory Visit: Payer: PPO | Admitting: Podiatry

## 2019-10-10 ENCOUNTER — Other Ambulatory Visit: Payer: Self-pay

## 2019-10-10 ENCOUNTER — Encounter: Payer: Self-pay | Admitting: Podiatry

## 2019-10-10 DIAGNOSIS — M79676 Pain in unspecified toe(s): Secondary | ICD-10-CM

## 2019-10-10 DIAGNOSIS — Q828 Other specified congenital malformations of skin: Secondary | ICD-10-CM

## 2019-10-10 DIAGNOSIS — B351 Tinea unguium: Secondary | ICD-10-CM | POA: Diagnosis not present

## 2019-10-14 NOTE — Progress Notes (Signed)
Subjective: 79 y.o. returns the office today for painful, elongated, thickened toenails which she is unable to trim herself as well as for callus to her 2nd toe and she has a new one on the 5th toe since she has changed her shoes.  Denies any swelling or redness or any drainage.  She has no new concerns today. Denies any systemic complaints such as fevers, chills, nausea, vomiting.   Objective: AAO 3, NAD DP/PT pulses palpable, CRT less than 3 seconds Nails mildly hypertrophic, dystrophic, elongated, brittle, discolored 10. There is tenderness overlying the nails 1-5 bilaterally. There is no surrounding erythema or drainage along the nail sites. Hyperkeratotic lesions of the distal aspect left second digit as well as the dorsal lateral aspect of the fifth digit left foot. No underlying ulceration, drainage or other signs of infection.  No open lesions or other pre-ulcerative lesions are identified. No pain with calf compression, swelling, warmth, erythema.  Assessment: Symptomatic onychomycosis; hyperkeratotic lesion left foot  Plan: -Treatment options including alternatives, risks, complications were discussed -Nails sharply debrided 10 without complication/bleeding. -Hyperkeratotic lesion sharply debrided 2 without complication/bleeding. -Follow-up in 4 weeks at her request or sooner if any problems are to arise. In the meantime, encouraged to call the office with any questions, concerns, changes symptoms.  Celesta Gentile, DPM

## 2019-10-24 ENCOUNTER — Other Ambulatory Visit: Payer: Self-pay

## 2019-10-24 ENCOUNTER — Encounter: Payer: Self-pay | Admitting: Physical Therapy

## 2019-10-24 ENCOUNTER — Ambulatory Visit: Payer: PPO | Admitting: Physical Therapy

## 2019-10-24 DIAGNOSIS — M6281 Muscle weakness (generalized): Secondary | ICD-10-CM

## 2019-10-24 DIAGNOSIS — R159 Full incontinence of feces: Secondary | ICD-10-CM

## 2019-10-24 DIAGNOSIS — N3942 Incontinence without sensory awareness: Secondary | ICD-10-CM

## 2019-10-24 DIAGNOSIS — R278 Other lack of coordination: Secondary | ICD-10-CM

## 2019-10-24 NOTE — Therapy (Signed)
[facility] [address], [place], [place], [address] Phone: [phone]   Fax:  [phone]  Physical Therapy Treatment  Patient Details  Name: Beth Simon MRN: [id number] Date of Birth: [birth year] Referring Provider (PT): Dr. Hosie Poisson   Encounter Date: 10/24/2019   PT End of Session - 10/24/19 1536    Visit Number 2    Date for PT Re-Evaluation 12/29/19    Authorization Type [employer]    PT Start Time 1530    PT Stop Time 1610    PT Time Calculation (min) 40 min    Activity Tolerance Patient tolerated treatment well    Behavior During Therapy [facility] for tasks assessed/performed           Past Medical History:  Diagnosis Date  . Adenomatous polyps 01/2016  . Anemia   . Breast cancer ([place]) 1990   bilateral mastectomy, adenoca breast-left MRM, reconstruction, chemo  . Bronchitis last 2 weeks   saw dr Felipa Eth 02-28-2014, he said no antibiotics needed, nonproductive cough  . Complication of anesthesia   . Cystitis    cytoxen, had once or twice  . Family history of breast cancer   . History of breast cancer   . History of radiation therapy 2/10, 2/12, 2/18, 2/24, 05/29/14   vaginal cuff/ 30 Gy/ 5 fx  . Hypertension   . Macular degeneration   . Neck pain    taking physictal therapy last 2 weeks  . Osteoporosis    On Prolia.  Marland Kitchen PONV (postoperative nausea and vomiting)   . Shoulder pain    taking physical therapy for last few weeks  . Uterine cancer ([place]) 03/21/2014   MLH1/PMS2 LOH  . Uterine fibroid   . VAIN I (vaginal intraepithelial neoplasia grade I) 2018   positive HR HPV.  . Vasovagal syncopes     Past Surgical History:  Procedure Laterality Date  . BREAST IMPLANT REMOVAL  6/94   left breast  . BREAST RECONSTRUCTION  1990   left  . CATARACT EXTRACTION Bilateral   . EXCISION VAGINAL CYST    . MASTECTOMY Right 10/08/06   prophylactic  . RADICAL MASTECTOMY LND  1990   left,  chemo done  . ROBOTIC ASSISTED TOTAL HYSTERECTOMY WITH BILATERAL SALPINGO OOPHERECTOMY Bilateral 03/21/2014   Procedure: ROBOTIC ASSISTED TOTAL HYSTERECTOMY WITH BILATERAL SALPINGO OOPHORECTOMY ;  Surgeon: Janie Morning, MD;  Location: [facility];  Service: Gynecology;  Laterality: Bilateral;    There were no vitals filed for this visit.   Subjective Assessment - 10/24/19 1535    Subjective I have not had an episode of leakage since last visit. Sometimes I have normal bowel movements or diarrhea.    Patient Stated Goals reduce fecal and urinary leakage    Currently in Pain? No/denies                             [facility] Adult PT Treatment/Exercise - 10/24/19 0001      Neuro Re-ed    Neuro Re-ed Details  supine with breath to engage the upper abdominals to bring the rib cage downward and the same in sidly, standing with posture cues to breath into the rib cage and as she breaths out to bring the rib cage downward with chest up      Manual Therapy   Manual Therapy Soft tissue mobilization;Myofascial release    Soft tissue mobilization lower intrecoastals bilaterally, diaphgram,  quadratus lumborum, abdominals    Myofascial Release tissue rolling alrong the back, lower rib cage and abdomen to elongate the fascia                  PT Education - 10/24/19 1611    Education Details posture to reduce overgripping of the upper abdominals; how to perform fascial work on the abdomen    Person(s) Educated Patient    Methods Explanation;Demonstration;Verbal cues;Handout    Comprehension Verbalized understanding;Returned demonstration            PT Short Term Goals - 10/06/19 1013      PT SHORT TERM GOAL #1   Title be independent in initial HEP    Time 4    Period Weeks    Status New    Target Date 11/03/19      PT SHORT TERM GOAL #2   Title fecal leakage decreased >/= 25% due to improve strength and coordination    Time 4    Period Weeks    Status New    Target  Date 11/03/19             PT Long Term Goals - 10/06/19 1014      PT LONG TERM GOAL #1   Title be independent in advanced HEP    Time 12    Period Weeks    Status New    Target Date 12/29/19      PT LONG TERM GOAL #2   Title fecal leakage decreased >/= 75% due to improved strength and endurance     Baseline --    Time 12    Period Weeks    Status New    Target Date 12/29/19      PT LONG TERM GOAL #3   Title ability to feel the leakage if it happens due to increased sensation of the anal sphincter    Baseline --    Time 12    Period Weeks    Status New    Target Date 12/29/19      PT LONG TERM GOAL #4   Title urinary leakage with coughing and sneezing decreased >/= 50%    Baseline --    Time 12    Period Weeks    Status New    Target Date 12/29/19                 Plan - 10/24/19 1536    Clinical Impression Statement Patient will bulge the lower abdomen when trying to contract her abdominals putting pressure on the pelvic floor. Patienthas increased fascial tightness in the upper abdomen and back since she had her masectomy and hysterectomy. Patient has difficulty with bringing her rib cage downward due to restrictions. Patient reports no fecal leakage since last visit and reduction of urinary leakage. Patient will benefit from skilled therapy to improve pelvic floor strengthand coordinaiton to reduce fecal and urinary leakage.    Personal Factors and Comorbidities Age;Comorbidity 3+    Comorbidities breast cancer, endometrial cancer, urterin cancer, osteoporosis; hysterectomy    Examination-Activity Limitations Continence;Sleep;Toileting    Examination-Participation Restrictions Community Activity    Stability/Clinical Decision Making Evolving/Moderate complexity    Rehab Potential Good    PT Frequency 1x / week    PT Duration 12 weeks    PT Treatment/Interventions ADLs/Self Care Home Management;Biofeedback;Neuromuscular re-education;Therapeutic  exercise;Therapeutic activities;Patient/family education;Manual techniques    PT Next Visit Plan pelvic floor contraction in sidly, quadruped, sitting, 1/2 kneel, abdominal massage; work  on bringing rib cage downward and release the back; work on lower abdominal contraction    Recommended Other Services MD signed initial eval    Consulted and Agree with Plan of Care Patient           Patient will benefit from skilled therapeutic intervention in order to improve the following deficits and impairments:  Decreased coordination, Increased fascial restricitons, Decreased endurance, Decreased activity tolerance, Decreased strength  Visit Diagnosis: Muscle weakness (generalized)  Other lack of coordination  Incontinence of feces, unspecified fecal incontinence type  Urinary incontinence without sensory awareness     Problem List Patient Active Problem List   Diagnosis Date Noted  . Transient cerebral ischemia 09/15/2017  . Pure hypercholesterolemia 09/15/2017  . Pulmonary nodule 09/15/2017  . Hypertension 09/15/2017  . Gastro-esophageal reflux disease without esophagitis 09/15/2017  . VAIN I (vaginal intraepithelial neoplasia grade I) 06/23/2017  . Genetic testing 05/23/2014  . History of breast cancer   . Family history of breast cancer   . Endometrial cancer ([place]) 03/21/2014  . Uterine cancer ([place]) 03/21/2014  . S/P mastectomy, bilateral 05/02/2013  . Situational stress 05/02/2013  . Osteoporosis, unspecified 05/02/2013  . Breast cancer, left breast ([place]) 05/02/2013    Earlie Counts, PT 10/24/19 4:16 PM   [facility] [address] [place], [place], [address] Phone: [phone]   Fax:  [phone]  Name: Beth Simon MRN: [id number] Date of Birth: 02-04-1941

## 2019-10-26 DIAGNOSIS — E7800 Pure hypercholesterolemia, unspecified: Secondary | ICD-10-CM | POA: Diagnosis not present

## 2019-10-26 DIAGNOSIS — Z0000 Encounter for general adult medical examination without abnormal findings: Secondary | ICD-10-CM | POA: Diagnosis not present

## 2019-10-26 DIAGNOSIS — K219 Gastro-esophageal reflux disease without esophagitis: Secondary | ICD-10-CM | POA: Diagnosis not present

## 2019-10-26 DIAGNOSIS — E041 Nontoxic single thyroid nodule: Secondary | ICD-10-CM | POA: Diagnosis not present

## 2019-10-26 DIAGNOSIS — I10 Essential (primary) hypertension: Secondary | ICD-10-CM | POA: Diagnosis not present

## 2019-10-26 DIAGNOSIS — Z79899 Other long term (current) drug therapy: Secondary | ICD-10-CM | POA: Diagnosis not present

## 2019-10-26 DIAGNOSIS — Z1389 Encounter for screening for other disorder: Secondary | ICD-10-CM | POA: Diagnosis not present

## 2019-10-26 DIAGNOSIS — M810 Age-related osteoporosis without current pathological fracture: Secondary | ICD-10-CM | POA: Diagnosis not present

## 2019-10-31 ENCOUNTER — Encounter: Payer: Self-pay | Admitting: Physical Therapy

## 2019-10-31 ENCOUNTER — Ambulatory Visit: Payer: PPO | Attending: Obstetrics and Gynecology | Admitting: Physical Therapy

## 2019-10-31 ENCOUNTER — Other Ambulatory Visit: Payer: Self-pay

## 2019-10-31 DIAGNOSIS — R278 Other lack of coordination: Secondary | ICD-10-CM | POA: Insufficient documentation

## 2019-10-31 DIAGNOSIS — R159 Full incontinence of feces: Secondary | ICD-10-CM | POA: Diagnosis not present

## 2019-10-31 DIAGNOSIS — M6281 Muscle weakness (generalized): Secondary | ICD-10-CM | POA: Insufficient documentation

## 2019-10-31 DIAGNOSIS — N3942 Incontinence without sensory awareness: Secondary | ICD-10-CM | POA: Insufficient documentation

## 2019-10-31 NOTE — Therapy (Signed)
[facility] [address] [place], [place], [address] Phone: [phone]   Fax:  [phone]  Physical Therapy Treatment  Patient Details  Name: Beth Simon MRN: [id number] Date of Birth: 20-May-1940 Referring Provider (PT): Dr. Hosie Poisson   Encounter Date: 10/31/2019   PT End of Session - 10/31/19 1613    Visit Number 3    Date for PT Re-Evaluation 12/29/19    Authorization Type [employer]    PT Start Time 1530    PT Stop Time 1610    PT Time Calculation (min) 40 min    Activity Tolerance Patient tolerated treatment well    Behavior During Therapy [facility] for tasks assessed/performed           Past Medical History:  Diagnosis Date  . Adenomatous polyps 01/2016  . Anemia   . Breast cancer ([place]) 1990   bilateral mastectomy, adenoca breast-left MRM, reconstruction, chemo  . Bronchitis last 2 weeks   saw dr Felipa Eth 02-28-2014, he said no antibiotics needed, nonproductive cough  . Complication of anesthesia   . Cystitis    cytoxen, had once or twice  . Family history of breast cancer   . History of breast cancer   . History of radiation therapy 2/10, 2/12, 2/18, 2/24, 05/29/14   vaginal cuff/ 30 Gy/ 5 fx  . Hypertension   . Macular degeneration   . Neck pain    taking physictal therapy last 2 weeks  . Osteoporosis    On Prolia.  Marland Kitchen PONV (postoperative nausea and vomiting)   . Shoulder pain    taking physical therapy for last few weeks  . Uterine cancer ([place]) 03/21/2014   MLH1/PMS2 LOH  . Uterine fibroid   . VAIN I (vaginal intraepithelial neoplasia grade I) 2018   positive HR HPV.  . Vasovagal syncopes     Past Surgical History:  Procedure Laterality Date  . BREAST IMPLANT REMOVAL  6/94   left breast  . BREAST RECONSTRUCTION  1990   left  . CATARACT EXTRACTION Bilateral   . EXCISION VAGINAL CYST    . MASTECTOMY Right 10/08/06   prophylactic  . RADICAL MASTECTOMY LND  1990   left,  chemo done  . ROBOTIC ASSISTED TOTAL HYSTERECTOMY WITH BILATERAL SALPINGO OOPHERECTOMY Bilateral 03/21/2014   Procedure: ROBOTIC ASSISTED TOTAL HYSTERECTOMY WITH BILATERAL SALPINGO OOPHORECTOMY ;  Surgeon: Janie Morning, MD;  Location: [facility];  Service: Gynecology;  Laterality: Bilateral;    There were no vitals filed for this visit.   Subjective Assessment - 10/31/19 1531    Subjective I have not had leakage during the night since last visit. I feel like I have control. I have not lealked after a bowel movement.    Patient Stated Goals reduce fecal and urinary leakage    Currently in Pain? No/denies                             [facility] Adult PT Treatment/Exercise - 10/31/19 0001      Lumbar Exercises: Supine   Ab Set 10 reps;1 second    Bent Knee Raise 20 reps;1 second    Bent Knee Raise Limitations with abdominal contraction    Other Supine Lumbar Exercises serratus anterior push       Manual Therapy   Manual Therapy Soft tissue mobilization;Myofascial release    Myofascial Release tissue rolling alrong the back, lower rib cage and abdomen to  elongate the fascia                  PT Education - 10/31/19 1613    Education Details Access Code: [email or web address]    Person(s) Educated Patient    Methods Explanation;Demonstration;Verbal cues;Handout    Comprehension Returned demonstration;Verbalized understanding            PT Short Term Goals - 10/31/19 1617      PT SHORT TERM GOAL #1   Title be independent in initial HEP    Time 4    Period Weeks    Status Achieved      PT SHORT TERM GOAL #2   Title fecal leakage decreased >/= 25% due to improve strength and coordination    Time 4    Period Weeks    Status Achieved             PT Long Term Goals - 10/06/19 1014      PT LONG TERM GOAL #1   Title be independent in advanced HEP    Time 12    Period Weeks    Status New    Target Date 12/29/19      PT LONG TERM GOAL #2   Title fecal leakage  decreased >/= 75% due to improved strength and endurance     Baseline --    Time 12    Period Weeks    Status New    Target Date 12/29/19      PT LONG TERM GOAL #3   Title ability to feel the leakage if it happens due to increased sensation of the anal sphincter    Baseline --    Time 12    Period Weeks    Status New    Target Date 12/29/19      PT LONG TERM GOAL #4   Title urinary leakage with coughing and sneezing decreased >/= 50%    Baseline --    Time 12    Period Weeks    Status New    Target Date 12/29/19                 Plan - 10/31/19 1601    Clinical Impression Statement Patient reports no urinary or fecal leakage since last visit. Patient is not having leakage after she goes to the bathroom. Patient is now able to engage her lower abdominals without bulging in supine. She has less bilateral rib flare due to improved tissue mobility. Patient is learning how to engage her core with her exercises. Patient will benefit from skilled therapy to improve pelvic floor strength and coordination to reduce fecal and urinary leakage.    Personal Factors and Comorbidities Age;Comorbidity 3+    Comorbidities breast cancer, endometrial cancer, urterin cancer, osteoporosis; hysterectomy    Examination-Activity Limitations Continence;Sleep;Toileting    Examination-Participation Restrictions Community Activity    Stability/Clinical Decision Making Evolving/Moderate complexity    Rehab Potential Good    PT Frequency 1x / week    PT Duration 12 weeks    PT Treatment/Interventions ADLs/Self Care Home Management;Biofeedback;Neuromuscular re-education;Therapeutic exercise;Therapeutic activities;Patient/family education;Manual techniques    PT Next Visit Plan quadruped, sitting, 1/2 kneel, abdominal massage; work on bringing rib cage downward and release the back; work on lower abdominal contraction    PT Home Exercise Plan Access Code: [email or web address]    Consulted and Agree with Plan of Care  Patient           Patient will benefit from skilled therapeutic intervention  in order to improve the following deficits and impairments:  Decreased coordination, Increased fascial restricitons, Decreased endurance, Decreased activity tolerance, Decreased strength  Visit Diagnosis: Muscle weakness (generalized)  Other lack of coordination  Incontinence of feces, unspecified fecal incontinence type  Urinary incontinence without sensory awareness     Problem List Patient Active Problem List   Diagnosis Date Noted  . Transient cerebral ischemia 09/15/2017  . Pure hypercholesterolemia 09/15/2017  . Pulmonary nodule 09/15/2017  . Hypertension 09/15/2017  . Gastro-esophageal reflux disease without esophagitis 09/15/2017  . VAIN I (vaginal intraepithelial neoplasia grade I) 06/23/2017  . Genetic testing 05/23/2014  . History of breast cancer   . Family history of breast cancer   . Endometrial cancer ([place]) 03/21/2014  . Uterine cancer ([place]) 03/21/2014  . S/P mastectomy, bilateral 05/02/2013  . Situational stress 05/02/2013  . Osteoporosis, unspecified 05/02/2013  . Breast cancer, left breast ([place]) 05/02/2013    Earlie Counts, PT 10/31/19 4:18 PM   [facility] [address] [place], [place], [address] Phone: [phone]   Fax:  [phone]  Name: KENNYA SCHWENN MRN: [id number] Date of Birth: January 09, 1941

## 2019-10-31 NOTE — Patient Instructions (Signed)
Access Code: [email or web address] URL: [email or web address] Date: 10/31/2019 Prepared by: Earlie Counts  Exercises Supine March - 1 x daily - 7 x weekly - 2 sets - 10 reps Supine Scapular Protraction in Flexion with Dumbbells - 1 x daily - 7 x weekly - 1 sets - 10 reps Serratus Activation at Wall with Foam Roll and Resistance Band - 1 x daily - 7 x weekly - 1 sets - 10 reps [facility] [address] [place], [place] [address] Phone # [phone] Fax [phone]

## 2019-11-07 ENCOUNTER — Other Ambulatory Visit: Payer: Self-pay

## 2019-11-07 ENCOUNTER — Ambulatory Visit: Payer: PPO | Admitting: Podiatry

## 2019-11-07 DIAGNOSIS — Q828 Other specified congenital malformations of skin: Secondary | ICD-10-CM

## 2019-11-07 DIAGNOSIS — M79676 Pain in unspecified toe(s): Secondary | ICD-10-CM | POA: Diagnosis not present

## 2019-11-07 DIAGNOSIS — B351 Tinea unguium: Secondary | ICD-10-CM | POA: Diagnosis not present

## 2019-11-07 NOTE — Progress Notes (Signed)
Subjective: 79 y.o. returns the office today for painful, elongated, thickened toenails which she is unable to trim herself as well as for callus to her 2nd toe as well as left fifth toe.  Denies any ulcerations.  No swelling or redness.  She has no other concerns today.  Objective: AAO 3, NAD DP/PT pulses palpable, CRT less than 3 seconds Nails mildly hypertrophic, dystrophic, elongated, brittle, discolored 10. There is tenderness overlying the nails 1-5 bilaterally. There is no surrounding erythema or drainage along the nail sites. Hyperkeratotic lesions of the distal aspect left second digit as well as the dorsal lateral aspect of the fifth digit left foot. No underlying ulceration, drainage or other signs of infection.  No open lesions or other pre-ulcerative lesions are identified. No pain with calf compression, swelling, warmth, erythema.  Assessment: Symptomatic onychomycosis; hyperkeratotic lesion left foot  Plan: -Treatment options including alternatives, risks, complications were discussed -Nails sharply debrided 10 without complication/bleeding. -Hyperkeratotic lesion sharply debrided 2 without complication/bleeding. -Follow-up in 4 weeks at her request or sooner if any problems are to arise. In the meantime, encouraged to call the office with any questions, concerns, changes symptoms.  Celesta Gentile, DPM

## 2019-11-24 ENCOUNTER — Ambulatory Visit: Payer: PPO | Admitting: Physical Therapy

## 2019-11-28 ENCOUNTER — Encounter: Payer: Self-pay | Admitting: Physical Therapy

## 2019-11-28 ENCOUNTER — Other Ambulatory Visit: Payer: Self-pay

## 2019-11-28 ENCOUNTER — Ambulatory Visit: Payer: PPO | Admitting: Physical Therapy

## 2019-11-28 DIAGNOSIS — R159 Full incontinence of feces: Secondary | ICD-10-CM

## 2019-11-28 DIAGNOSIS — M6281 Muscle weakness (generalized): Secondary | ICD-10-CM | POA: Diagnosis not present

## 2019-11-28 DIAGNOSIS — N3942 Incontinence without sensory awareness: Secondary | ICD-10-CM

## 2019-11-28 DIAGNOSIS — R278 Other lack of coordination: Secondary | ICD-10-CM

## 2019-11-28 NOTE — Therapy (Signed)
[facility] [address], [place], [place], [address] Phone: [phone]   Fax:  [phone]  Physical Therapy Treatment  Patient Details  Name: Beth Simon MRN: [id number] Date of Birth: September 05, 1940 Referring Provider (PT): Dr. Hosie Poisson   Encounter Date: 11/28/2019   PT End of Session - 11/28/19 1235    Visit Number 4    Date for PT Re-Evaluation 12/29/19    Authorization Type [employer]    Authorization - Visit Number 4    Authorization - Number of Visits 10    PT Start Time 2542   came late   PT Stop Time 7062    PT Time Calculation (min) 38 min    Activity Tolerance Patient tolerated treatment well    Behavior During Therapy [facility] for tasks assessed/performed           Past Medical History:  Diagnosis Date  . Adenomatous polyps 01/2016  . Anemia   . Breast cancer ([place]) 1990   bilateral mastectomy, adenoca breast-left MRM, reconstruction, chemo  . Bronchitis last 2 weeks   saw dr Felipa Eth 02-28-2014, he said no antibiotics needed, nonproductive cough  . Complication of anesthesia   . Cystitis    cytoxen, had once or twice  . Family history of breast cancer   . History of breast cancer   . History of radiation therapy 2/10, 2/12, 2/18, 2/24, 05/29/14   vaginal cuff/ 30 Gy/ 5 fx  . Hypertension   . Macular degeneration   . Neck pain    taking physictal therapy last 2 weeks  . Osteoporosis    On Prolia.  Marland Kitchen PONV (postoperative nausea and vomiting)   . Shoulder pain    taking physical therapy for last few weeks  . Uterine cancer ([place]) 03/21/2014   MLH1/PMS2 LOH  . Uterine fibroid   . VAIN I (vaginal intraepithelial neoplasia grade I) 2018   positive HR HPV.  . Vasovagal syncopes     Past Surgical History:  Procedure Laterality Date  . BREAST IMPLANT REMOVAL  6/94   left breast  . BREAST RECONSTRUCTION  1990   left  . CATARACT EXTRACTION Bilateral   . EXCISION VAGINAL CYST      . MASTECTOMY Right 10/08/06   prophylactic  . RADICAL MASTECTOMY LND  1990   left, chemo done  . ROBOTIC ASSISTED TOTAL HYSTERECTOMY WITH BILATERAL SALPINGO OOPHERECTOMY Bilateral 03/21/2014   Procedure: ROBOTIC ASSISTED TOTAL HYSTERECTOMY WITH BILATERAL SALPINGO OOPHORECTOMY ;  Surgeon: Janie Morning, MD;  Location: [facility];  Service: Gynecology;  Laterality: Bilateral;    There were no vitals filed for this visit.   Subjective Assessment - 11/28/19 1236    Subjective Patient has not had a single leakage during the day. No stains on her underwear. The exericses are helping.    Patient Stated Goals reduce fecal and urinary leakage    Currently in Pain? No/denies                             [facility] Adult PT Treatment/Exercise - 11/28/19 0001      Neuro Re-ed    Neuro Re-ed Details  breathing into the lower rib cage to expand them  and on exhale to contract her abdominals, patient needed tactile cues to contract the lower abdominals and not push out at the end of her breath      Manual Therapy   Manual Therapy  Soft tissue mobilization;Myofascial release    Soft tissue mobilization lumbar paraspinals, bil. lower rib intercoastal, diaphragm, scar tissue mobilization on the chest and lateral rib cage from when she had breast cancer    Myofascial Release tissue rolling of the lateral trunk by her scars and upper abdominal                    PT Short Term Goals - 11/28/19 1238      PT SHORT TERM GOAL #1   Title be independent in initial HEP    Time 4    Period Weeks    Status Achieved      PT SHORT TERM GOAL #2   Title fecal leakage decreased >/= 25% due to improve strength and coordination    Time 4    Period Weeks    Status Achieved             PT Long Term Goals - 11/28/19 1238      PT LONG TERM GOAL #1   Title be independent in advanced HEP    Baseline still learning    Time 12    Period Weeks    Status On-going      PT LONG TERM GOAL  #2   Title fecal leakage decreased >/= 75% due to improved strength and endurance     Time 12    Period Weeks    Status Achieved      PT LONG TERM GOAL #3   Title ability to feel the leakage if it happens due to increased sensation of the anal sphincter    Time 12    Period Weeks    Status Achieved      PT LONG TERM GOAL #4   Title urinary leakage with coughing and sneezing decreased >/= 50%    Time 12    Period Weeks    Status On-going                 Plan - 11/28/19 1323    Clinical Impression Statement Patient reports she is not having fecal leakage. She will leak urine when she coughs. Patient is doing her exercises and noticing an improvement. Patient is able to sit upright with greater ease due to less restriction in the anterior trunk. Patient will benefit from skilled therapy to improve pelvic floor coordination, increase strength and rib mobility or reduce urinary leakage.    Personal Factors and Comorbidities Age;Comorbidity 3+    Comorbidities breast cancer, endometrial cancer, urterin cancer, osteoporosis; hysterectomy    Examination-Activity Limitations Continence;Sleep;Toileting    Examination-Participation Restrictions Community Activity    Stability/Clinical Decision Making Evolving/Moderate complexity    Rehab Potential Good    PT Frequency 1x / week    PT Duration 12 weeks    PT Treatment/Interventions ADLs/Self Care Home Management;Biofeedback;Neuromuscular re-education;Therapeutic exercise;Therapeutic activities;Patient/family education;Manual techniques    PT Next Visit Plan quadruped, sitting, staggered standing with crossover arm movement, abdominal massage; work on bringing rib cage downward and release the back; work on lower abdominal contraction    PT Home Exercise Plan Access Code: [email or web address]    Consulted and Agree with Plan of Care Patient           Patient will benefit from skilled therapeutic intervention in order to improve the following  deficits and impairments:  Decreased coordination, Increased fascial restricitons, Decreased endurance, Decreased activity tolerance, Decreased strength  Visit Diagnosis: Muscle weakness (generalized)  Other lack of coordination  Incontinence of feces,  unspecified fecal incontinence type  Urinary incontinence without sensory awareness     Problem List Patient Active Problem List   Diagnosis Date Noted  . Transient cerebral ischemia 09/15/2017  . Pure hypercholesterolemia 09/15/2017  . Pulmonary nodule 09/15/2017  . Hypertension 09/15/2017  . Gastro-esophageal reflux disease without esophagitis 09/15/2017  . VAIN I (vaginal intraepithelial neoplasia grade I) 06/23/2017  . Genetic testing 05/23/2014  . History of breast cancer   . Family history of breast cancer   . Endometrial cancer ([place]) 03/21/2014  . Uterine cancer ([place]) 03/21/2014  . S/P mastectomy, bilateral 05/02/2013  . Situational stress 05/02/2013  . Osteoporosis, unspecified 05/02/2013  . Breast cancer, left breast ([place]) 05/02/2013    Earlie Counts, PT 11/28/19 1:27 PM   [facility] [address], [place], [place], [address] Phone: [phone]   Fax:  [phone]  Name: Beth Simon MRN: [id number] Date of Birth: [birth year]

## 2019-11-30 ENCOUNTER — Encounter: Payer: PPO | Admitting: Physical Therapy

## 2019-12-07 DIAGNOSIS — M810 Age-related osteoporosis without current pathological fracture: Secondary | ICD-10-CM | POA: Diagnosis not present

## 2019-12-07 DIAGNOSIS — M1611 Unilateral primary osteoarthritis, right hip: Secondary | ICD-10-CM | POA: Diagnosis not present

## 2019-12-08 ENCOUNTER — Ambulatory Visit: Payer: PPO | Admitting: Physical Therapy

## 2019-12-13 ENCOUNTER — Other Ambulatory Visit: Payer: Self-pay

## 2019-12-13 ENCOUNTER — Ambulatory Visit: Payer: PPO | Admitting: Dietician

## 2019-12-13 ENCOUNTER — Ambulatory Visit: Payer: PPO | Admitting: Podiatry

## 2019-12-13 DIAGNOSIS — M79676 Pain in unspecified toe(s): Secondary | ICD-10-CM | POA: Diagnosis not present

## 2019-12-13 DIAGNOSIS — B351 Tinea unguium: Secondary | ICD-10-CM | POA: Diagnosis not present

## 2019-12-13 DIAGNOSIS — Q828 Other specified congenital malformations of skin: Secondary | ICD-10-CM

## 2019-12-14 ENCOUNTER — Ambulatory Visit: Payer: PPO | Attending: Obstetrics and Gynecology | Admitting: Physical Therapy

## 2019-12-14 ENCOUNTER — Other Ambulatory Visit: Payer: Self-pay

## 2019-12-14 ENCOUNTER — Encounter: Payer: Self-pay | Admitting: Physical Therapy

## 2019-12-14 DIAGNOSIS — N3942 Incontinence without sensory awareness: Secondary | ICD-10-CM | POA: Diagnosis not present

## 2019-12-14 DIAGNOSIS — R278 Other lack of coordination: Secondary | ICD-10-CM | POA: Insufficient documentation

## 2019-12-14 DIAGNOSIS — M6281 Muscle weakness (generalized): Secondary | ICD-10-CM

## 2019-12-14 DIAGNOSIS — R159 Full incontinence of feces: Secondary | ICD-10-CM | POA: Diagnosis not present

## 2019-12-14 NOTE — Therapy (Signed)
[facility] [address] [place], [place], [address] Phone: [phone]   Fax:  [phone]  Physical Therapy Treatment  Patient Details  Name: Beth Simon MRN: [id number] Date of Birth: 07-01-1940 Referring Provider (PT): Dr. Hosie Poisson   Encounter Date: 12/14/2019   PT End of Session - 12/14/19 1103    Visit Number 5    Date for PT Re-Evaluation 12/29/19    Authorization Type [employer]    Authorization - Visit Number 5    Authorization - Number of Visits 10    PT Start Time 0938    PT Stop Time 1055    PT Time Calculation (min) 40 min    Activity Tolerance Patient tolerated treatment well    Behavior During Therapy [facility] for tasks assessed/performed           Past Medical History:  Diagnosis Date  . Adenomatous polyps 01/2016  . Anemia   . Breast cancer ([place]) 1990   bilateral mastectomy, adenoca breast-left MRM, reconstruction, chemo  . Bronchitis last 2 weeks   saw dr Felipa Eth 02-28-2014, he said no antibiotics needed, nonproductive cough  . Complication of anesthesia   . Cystitis    cytoxen, had once or twice  . Family history of breast cancer   . History of breast cancer   . History of radiation therapy 2/10, 2/12, 2/18, 2/24, 05/29/14   vaginal cuff/ 30 Gy/ 5 fx  . Hypertension   . Macular degeneration   . Neck pain    taking physictal therapy last 2 weeks  . Osteoporosis    On Prolia.  Marland Kitchen PONV (postoperative nausea and vomiting)   . Shoulder pain    taking physical therapy for last few weeks  . Uterine cancer ([place]) 03/21/2014   MLH1/PMS2 LOH  . Uterine fibroid   . VAIN I (vaginal intraepithelial neoplasia grade I) 2018   positive HR HPV.  . Vasovagal syncopes     Past Surgical History:  Procedure Laterality Date  . BREAST IMPLANT REMOVAL  6/94   left breast  . BREAST RECONSTRUCTION  1990   left  . CATARACT EXTRACTION Bilateral   . EXCISION VAGINAL CYST    .  MASTECTOMY Right 10/08/06   prophylactic  . RADICAL MASTECTOMY LND  1990   left, chemo done  . ROBOTIC ASSISTED TOTAL HYSTERECTOMY WITH BILATERAL SALPINGO OOPHERECTOMY Bilateral 03/21/2014   Procedure: ROBOTIC ASSISTED TOTAL HYSTERECTOMY WITH BILATERAL SALPINGO OOPHORECTOMY ;  Surgeon: Janie Morning, MD;  Location: [facility];  Service: Gynecology;  Laterality: Bilateral;    There were no vitals filed for this visit.   Subjective Assessment - 12/14/19 1021    Subjective A couple of times I felt wet in the anal area and it was what I have eaten. Things give me more diarrhea now but not in the past. I had a cough and small drop of urine came out.    Patient Stated Goals reduce fecal and urinary leakage    Currently in Pain? No/denies                             [facility] Adult PT Treatment/Exercise - 12/14/19 0001      Neuro Re-ed    Neuro Re-ed Details  sidely with breath and engaging the obliques and TA; sitting engaging the abdominals and VC to contract the lower not just the upper abdominals      Manual  Therapy   Manual Therapy Soft tissue mobilization;Myofascial release    Soft tissue mobilization soft tissue work to the lower rib intercoastals, along the diaphragm, and obliques   sidely   Myofascial Release tissue rolling of the lateral and anterior trunk, around the lower rib cage, along the rib angle; as patient breaths performing fascial release to elongate the tissue over and connecting to the rib cage   sidely                 PT Education - 12/14/19 1058    Education Details Access Code: [email or web address]    Person(s) Educated Patient    Methods Explanation;Demonstration;Verbal cues;Handout    Comprehension Returned demonstration;Verbalized understanding            PT Short Term Goals - 11/28/19 1238      PT SHORT TERM GOAL #1   Title be independent in initial HEP    Time 4    Period Weeks    Status Achieved      PT SHORT TERM GOAL #2   Title fecal  leakage decreased >/= 25% due to improve strength and coordination    Time 4    Period Weeks    Status Achieved             PT Long Term Goals - 12/14/19 1108      PT LONG TERM GOAL #1   Title be independent in advanced HEP    Baseline still learning    Time 12    Period Weeks    Status On-going      PT LONG TERM GOAL #2   Title fecal leakage decreased >/= 75% due to improved strength and endurance     Time 12    Period Weeks    Status Achieved      PT LONG TERM GOAL #3   Title ability to feel the leakage if it happens due to increased sensation of the anal sphincter    Time 12    Period Weeks    Status Achieved      PT LONG TERM GOAL #4   Title urinary leakage with coughing and sneezing decreased >/= 50%    Time 12    Period Weeks    Status On-going      PT LONG TERM GOAL #5   Baseline ==                 Plan - 12/14/19 1054    Clinical Impression Statement Patient had two episodes of feeling wetness in the anal region while she was sleeping and proceeded to the toilet instead of soiling her sheets for the first time. Patient will leak a drop of urine while she coughs. She sees that when she exercises daily there is less issue compared to 3 times per week. Patient is now able to engage the obliques to bring her ribs downward in sitting but still needs verbal cues to engage the lower abdominals. After manual work she is able to sit upright with greater ease and bring her shoulders down. Patient will benefit from skilled therapy to improve pelvic floor coordination, increased core strength and rib mobility.    Personal Factors and Comorbidities Age;Comorbidity 3+    Comorbidities breast cancer, endometrial cancer, urterin cancer, osteoporosis; hysterectomy    Examination-Activity Limitations Continence;Sleep;Toileting    Examination-Participation Restrictions Community Activity    Stability/Clinical Decision Making Evolving/Moderate complexity    Rehab Potential  Good    PT Frequency 1x /  week    PT Duration 12 weeks    PT Treatment/Interventions ADLs/Self Care Home Management;Biofeedback;Neuromuscular re-education;Therapeutic exercise;Therapeutic activities;Patient/family education;Manual techniques    PT Next Visit Plan continue with tissue work for rib mobiity; staggered standing with crossover arm movement ; either discharge or renewal for several more appointments    PT Home Exercise Plan Access Code: [email or web address]    Consulted and Agree with Plan of Care Patient           Patient will benefit from skilled therapeutic intervention in order to improve the following deficits and impairments:  Decreased coordination, Increased fascial restricitons, Decreased endurance, Decreased activity tolerance, Decreased strength  Visit Diagnosis: Muscle weakness (generalized)  Other lack of coordination  Incontinence of feces, unspecified fecal incontinence type  Urinary incontinence without sensory awareness     Problem List Patient Active Problem List   Diagnosis Date Noted  . Transient cerebral ischemia 09/15/2017  . Pure hypercholesterolemia 09/15/2017  . Pulmonary nodule 09/15/2017  . Hypertension 09/15/2017  . Gastro-esophageal reflux disease without esophagitis 09/15/2017  . VAIN I (vaginal intraepithelial neoplasia grade I) 06/23/2017  . Genetic testing 05/23/2014  . History of breast cancer   . Family history of breast cancer   . Endometrial cancer ([place]) 03/21/2014  . Uterine cancer ([place]) 03/21/2014  . S/P mastectomy, bilateral 05/02/2013  . Situational stress 05/02/2013  . Osteoporosis, unspecified 05/02/2013  . Breast cancer, left breast ([place]) 05/02/2013    Earlie Counts, PT 12/14/19 11:09 AM   [facility] [address] [place], [place], [address] Phone: [phone]   Fax:  [phone]  Name: PAISELY BRICK MRN: [id number] Date of Birth: [birth year]

## 2019-12-14 NOTE — Patient Instructions (Signed)
Access Code: [email or web address] URL: [email or web address] Date: 12/14/2019 Prepared by: Earlie Counts  Exercises Supine March - 1 x daily - 7 x weekly - 2 sets - 10 reps Supine Scapular Protraction in Flexion with Dumbbells - 1 x daily - 7 x weekly - 1 sets - 10 reps Serratus Activation at Wall with Foam Roll and Resistance Band - 1 x daily - 7 x weekly - 1 sets - 10 reps Seated Transversus Abdominis Bracing - 3 x daily - 7 x weekly - 1 sets - 5 reps - 5 sec hold [facility] [address], [place], [place] [address] Phone # [phone] Fax [phone]

## 2019-12-19 ENCOUNTER — Ambulatory Visit (INDEPENDENT_AMBULATORY_CARE_PROVIDER_SITE_OTHER): Payer: PPO | Admitting: Podiatry

## 2019-12-19 ENCOUNTER — Other Ambulatory Visit: Payer: Self-pay

## 2019-12-19 DIAGNOSIS — L600 Ingrowing nail: Secondary | ICD-10-CM

## 2019-12-19 MED ORDER — CEPHALEXIN 500 MG PO CAPS: 500.0000 mg | ORAL_CAPSULE | Freq: Three times a day (TID) | ORAL | 0 refills | Status: DC

## 2019-12-19 NOTE — Patient Instructions (Signed)

## 2019-12-19 NOTE — Progress Notes (Signed)
Subjective: 79 y.o. returns the office today for painful, elongated, thickened toenails which she is unable to trim herself as well as for callus to her 2nd toe as well as left fifth toe.  Denies any ulcerations.  No swelling or redness.  She has no other concerns today.  Objective: AAO 3, NAD DP/PT pulses palpable, CRT less than 3 seconds Nails mildly hypertrophic, dystrophic, elongated, brittle, discolored 10. There is tenderness overlying the nails 1-5 bilaterally. There is no surrounding erythema or drainage along the nail sites. Hyperkeratotic lesions of the distal aspect left second digit as well as the dorsal lateral aspect of the fifth digit left foot. No underlying ulceration, drainage or other signs of infection.  No open lesions or other pre-ulcerative lesions are identified. No pain with calf compression, swelling, warmth, erythema. Overall, no changes   Assessment: Symptomatic onychomycosis; hyperkeratotic lesion left foot  Plan: -Treatment options including alternatives, risks, complications were discussed -Nails sharply debrided 10 without complication/bleeding. -Hyperkeratotic lesion sharply debrided 2 without complication/bleeding. -Follow-up in 4 weeks at her request or sooner if any problems are to arise. In the meantime, encouraged to call the office with any questions, concerns, changes symptoms.  Celesta Gentile, DPM

## 2019-12-20 ENCOUNTER — Ambulatory Visit: Payer: PPO | Admitting: Podiatry

## 2019-12-22 ENCOUNTER — Ambulatory Visit: Payer: PPO | Admitting: Physical Therapy

## 2019-12-23 NOTE — Progress Notes (Signed)
Subjective: 79 year old female presents the office today for concerns of infection, pain to her left third toe.  She appears to have the nails trimmed and she states on Friday the nail became swollen red and tender.  She states that has become somewhat better in regards to the redness but still tender.  She has not seen any drainage or pus and points mostly to the medial aspect. Denies any systemic complaints such as fevers, chills, nausea, vomiting. No acute changes since last appointment, and no other complaints at this time.   Objective: AAO x3, NAD DP/PT pulses palpable bilaterally, CRT less than 3 seconds Incurvation present to the nails in general but most notably in the left third toe nail incurvation present of the nail borders but there is localized edema and erythema with small amount of pus was identified.  No ascending cellulitis.  Tenderness palpation of the nail border.  No pain with calf compression, swelling, warmth, erythema  Assessment: Ingrown toenail infection left third toe  Plan: -All treatment options discussed with the patient including all alternatives, risks, complications.  -At this time, recommended partial nail removal without chemical matricectomy to the left 3rd toe due to infection. Risks and complications were discussed with the patient for which they understand and  verbally consent to the procedure. Under sterile conditions a total of 3 mL of a mixture of 2% lidocaine plain and 0.5% Marcaine plain was infiltrated in a hallux block fashion. Once anesthetized, the skin was prepped in sterile fashion. A tourniquet was then applied. Next the symptomatic border of the hallux nail border was sharply excised making sure to remove the entire offending nail border.  Small amount of purulence was identified.  Once the nail was removed, the area was debrided and the underlying skin was intact.  No further purulence was identified.  The area was irrigated and hemostasis was  obtained.  A dry sterile dressing was applied. After application of the dressing the tourniquet was removed and there is found to be an immediate capillary refill time to the digit. The patient tolerated the procedure well any complications. Post procedure instructions were discussed the patient for which he verbally understood. Follow-up in one week for nail check or sooner if any problems are to arise. Discussed signs/symptoms of worsening infection and directed to call the office immediately should any occur or go directly to the emergency room. In the meantime, encouraged to call the office with any questions, concerns, changes symptoms. -Keflex -Patient encouraged to call the office with any questions, concerns, change in symptoms.   Trula Slade DPM

## 2019-12-27 ENCOUNTER — Other Ambulatory Visit: Payer: Self-pay

## 2019-12-27 ENCOUNTER — Encounter: Payer: Self-pay | Admitting: Podiatry

## 2019-12-27 ENCOUNTER — Ambulatory Visit: Payer: PPO | Admitting: Podiatry

## 2019-12-27 DIAGNOSIS — L600 Ingrowing nail: Secondary | ICD-10-CM

## 2019-12-28 ENCOUNTER — Encounter: Payer: Self-pay | Admitting: Physical Therapy

## 2019-12-28 ENCOUNTER — Ambulatory Visit: Payer: PPO | Admitting: Physical Therapy

## 2019-12-28 DIAGNOSIS — R159 Full incontinence of feces: Secondary | ICD-10-CM

## 2019-12-28 DIAGNOSIS — R278 Other lack of coordination: Secondary | ICD-10-CM

## 2019-12-28 DIAGNOSIS — N3942 Incontinence without sensory awareness: Secondary | ICD-10-CM

## 2019-12-28 DIAGNOSIS — M6281 Muscle weakness (generalized): Secondary | ICD-10-CM | POA: Diagnosis not present

## 2019-12-28 NOTE — Therapy (Signed)
[facility] [address] [place], [place], [address] Phone: [phone]   Fax:  [phone]  Physical Therapy Treatment  Patient Details  Name: KIMBELLA HEISLER MRN: [id number] Date of Birth: [birth year] Referring Provider (PT): Dr. Concepcion Elk   Encounter Date: 12/28/2019   PT End of Session - 12/28/19 1532    Visit Number 6    Date for PT Re-Evaluation 12/29/19    Authorization Type [employer]    Authorization - Visit Number 6    Authorization - Number of Visits 10    PT Start Time 1445    PT Stop Time 1523    PT Time Calculation (min) 38 min    Activity Tolerance Patient tolerated treatment well    Behavior During Therapy [facility] for tasks assessed/performed           Past Medical History:  Diagnosis Date  . Adenomatous polyps 01/2016  . Anemia   . Breast cancer (HCC) 1990   bilateral mastectomy, adenoca breast-left MRM, reconstruction, chemo  . Bronchitis last 2 weeks   saw dr Pete Glatter 02-28-2014, he said no antibiotics needed, nonproductive cough  . Complication of anesthesia   . Cystitis    cytoxen, had once or twice  . Family history of breast cancer   . History of breast cancer   . History of radiation therapy 2/10, 2/12, 2/18, 2/24, 05/29/14   vaginal cuff/ 30 Gy/ 5 fx  . Hypertension   . Macular degeneration   . Neck pain    taking physictal therapy last 2 weeks  . Osteoporosis    On Prolia.  Marland Kitchen PONV (postoperative nausea and vomiting)   . Shoulder pain    taking physical therapy for last few weeks  . Uterine cancer (HCC) 03/21/2014   MLH1/PMS2 LOH  . Uterine fibroid   . VAIN I (vaginal intraepithelial neoplasia grade I) 2018   positive HR HPV.  . Vasovagal syncopes     Past Surgical History:  Procedure Laterality Date  . BREAST IMPLANT REMOVAL  6/94   left breast  . BREAST RECONSTRUCTION  1990   left  . CATARACT EXTRACTION Bilateral   . EXCISION VAGINAL CYST    .  MASTECTOMY Right 10/08/06   prophylactic  . RADICAL MASTECTOMY LND  1990   left, chemo done  . ROBOTIC ASSISTED TOTAL HYSTERECTOMY WITH BILATERAL SALPINGO OOPHERECTOMY Bilateral 03/21/2014   Procedure: ROBOTIC ASSISTED TOTAL HYSTERECTOMY WITH BILATERAL SALPINGO OOPHORECTOMY ;  Surgeon: Laurette Schimke, MD;  Location: [facility];  Service: Gynecology;  Laterality: Bilateral;    There were no vitals filed for this visit.   Subjective Assessment - 12/28/19 1449    Subjective The doctor put me on an antibiotic for the big toe and I had diarrhea. I have been fine except when I was on the antibiotic.    Patient Stated Goals reduce fecal and urinary leakage    Currently in Pain? No/denies    Multiple Pain Sites No              [facility] PT Assessment - 12/28/19 0001      Assessment   Medical Diagnosis R32 Urinary incontinence, unspecified type; R15.9 incontinence of feces, unspecified fecal incontinence    Referring Provider (PT) Dr. Concepcion Elk    Onset Date/Surgical Date 10/05/17    Prior Therapy yes      Precautions   Precautions Other (comment)    Precaution Comments breast, uterine, endometrial cancer with  radiation      Restrictions   Weight Bearing Restrictions No      Home Ecologist residence      Prior Function   Level of Independence Independent    Vocation Retired      Associate Professor   Overall Cognitive Status Within Functional Limits for tasks assessed      Posture/Postural Control   Posture/Postural Control No significant limitations      AROM   Overall AROM Comments lumbar full ROM      Strength   Right Hip ABduction 4+/5    Left Hip ABduction 4+/5                      Pelvic Floor Special Questions - 12/28/19 0001    Pad use wears 1 thin pad for just in case    Activities that cause leaking Coughing;Sneezing   ust a drop   Urinary frequency 1 BM per day    Fecal incontinence --   urinate when have a bowel  movement   Strength fair squeeze, definite lift             [facility] Adult PT Treatment/Exercise - 12/28/19 0001      Therapeutic Activites    Therapeutic Activities Other Therapeutic Activities    Other Therapeutic Activities education on upright posture with lifting up from thoracic then flexing the spine, she feels like when she crunches down there is more compression on her abdomen and discomfort      Exercises   Exercises Other Exercises    Other Exercises  stand with rolling foam roll up door and hold to stretch ant. and post. trunk      Lumbar Exercises: Seated   Other Seated Lumbar Exercises sit on chair with foam roller along the spine to increased thoracic extension; sit with foam roll horizontal to the spine and do PNF to open up the chest and spine      Lumbar Exercises: Supine   Other Supine Lumbar Exercises lay on foam roll on side  to stretch the lateral trunk and rib cage with breath                  PT Education - 12/28/19 1532    Education Details using the foam roll to expand the thoracic and anterior trunk area    Person(s) Educated Patient    Methods Explanation;Demonstration    Comprehension Verbalized understanding;Returned demonstration            PT Short Term Goals - 11/28/19 1238      PT SHORT TERM GOAL #1   Title be independent in initial HEP    Time 4    Period Weeks    Status Achieved      PT SHORT TERM GOAL #2   Title fecal leakage decreased >/= 25% due to improve strength and coordination    Time 4    Period Weeks    Status Achieved             PT Long Term Goals - 12/28/19 1450      PT LONG TERM GOAL #1   Title be independent in advanced HEP    Baseline still learning    Time 12    Period Weeks    Status Achieved      PT LONG TERM GOAL #2   Title fecal leakage decreased >/= 75% due to improved strength and endurance  Time 12    Period Weeks    Status Achieved      PT LONG TERM GOAL #3   Title ability to  feel the leakage if it happens due to increased sensation of the anal sphincter    Time 12    Period Weeks    Status Achieved      PT LONG TERM GOAL #4   Title urinary leakage with coughing and sneezing decreased >/= 50%    Time 12    Period Weeks    Status Achieved                 Plan - 12/28/19 1533    Clinical Impression Statement Patient reports her urgency is 20% better. Her urinary leakage is 50% better. Her fecal leakage is 75% better. She is no using 1 thin pad for just incase. Patient is able to feel leakage now. Patient will urinate at the same time she is having a bowel movement. She is having 1 bowel movement per day. Patient has fascial tightness in the rib cage and trunk due to her scars from her masectomy. Patient learned exercises to stretch the area using a foam roll. Patient is happy with her progress for the pelvic floor. Patient is independent with her HEP.    Personal Factors and Comorbidities Age;Comorbidity 3+    Comorbidities breast cancer, endometrial cancer, urterin cancer, osteoporosis; hysterectomy    Examination-Activity Limitations Continence;Sleep;Toileting    Examination-Participation Restrictions Community Activity    Stability/Clinical Decision Making Evolving/Moderate complexity    Rehab Potential Good    PT Treatment/Interventions ADLs/Self Care Home Management;Biofeedback;Neuromuscular re-education;Therapeutic exercise;Therapeutic activities;Patient/family education;Manual techniques    PT Next Visit Plan Discharge to HEP    PT Home Exercise Plan Access Code: [email or web address]    Consulted and Agree with Plan of Care Patient           Patient will benefit from skilled therapeutic intervention in order to improve the following deficits and impairments:  Decreased coordination, Increased fascial restricitons, Decreased endurance, Decreased activity tolerance, Decreased strength  Visit Diagnosis: Muscle weakness (generalized)  Other lack of  coordination  Incontinence of feces, unspecified fecal incontinence type  Urinary incontinence without sensory awareness     Problem List Patient Active Problem List   Diagnosis Date Noted  . Transient cerebral ischemia 09/15/2017  . Pure hypercholesterolemia 09/15/2017  . Pulmonary nodule 09/15/2017  . Hypertension 09/15/2017  . Gastro-esophageal reflux disease without esophagitis 09/15/2017  . VAIN I (vaginal intraepithelial neoplasia grade I) 06/23/2017  . Genetic testing 05/23/2014  . History of breast cancer   . Family history of breast cancer   . Endometrial cancer ([place]) 03/21/2014  . Uterine cancer ([place]) 03/21/2014  . S/P mastectomy, bilateral 05/02/2013  . Situational stress 05/02/2013  . Osteoporosis, unspecified 05/02/2013  . Breast cancer, left breast ([place]) 05/02/2013    Daya Dutt 12/28/2019, 3:38 PM  [facility] [address] [place], [place], [address] Phone: [phone]   Fax:  [phone]  Name: ELZABETH MCQUERRY MRN: [id number] Date of Birth: Nov 01, 1940  PHYSICAL THERAPY DISCHARGE SUMMARY  Visits from Start of Care: 6  Current functional level related to goals / functional outcomes: See above.    Remaining deficits: See above.    Education / Equipment: HEP Plan: Patient agrees to discharge.  Patient goals were met. Patient is being discharged due to meeting the stated rehab goals. Thank you for the referral. Earlie Counts, PT 12/28/19 3:39 PM   ?????

## 2019-12-29 ENCOUNTER — Telehealth: Payer: Self-pay

## 2019-12-29 DIAGNOSIS — H524 Presbyopia: Secondary | ICD-10-CM | POA: Diagnosis not present

## 2019-12-29 DIAGNOSIS — H04123 Dry eye syndrome of bilateral lacrimal glands: Secondary | ICD-10-CM | POA: Diagnosis not present

## 2019-12-29 DIAGNOSIS — R32 Unspecified urinary incontinence: Secondary | ICD-10-CM

## 2019-12-29 DIAGNOSIS — Z961 Presence of intraocular lens: Secondary | ICD-10-CM | POA: Diagnosis not present

## 2019-12-29 DIAGNOSIS — R159 Full incontinence of feces: Secondary | ICD-10-CM

## 2019-12-29 NOTE — Telephone Encounter (Signed)
Pt requesting new referral for continued pelvic PT at [facility].   Referral placed. Routing to [facility] for referral  Cc: Dr Quincy Simmonds for review. Encounter closed

## 2019-12-29 NOTE — Telephone Encounter (Signed)
[facility] Wednesday and would like another referral. Contact person is Earlie Counts at fax number [phone].

## 2019-12-29 NOTE — Progress Notes (Signed)
Subjective: Beth Simon is a 79 y.o.  femalereturns to office today for follow up evaluation after having left 3rd partial nail avulsion performed. Patient has been soaking using epsom salts and applying topical antibiotic covered with bandaid daily. She has been doing well and the pain has improved.  No swelling or redness is also much improved.  She has no new concerns.  Patient denies fevers, chills, nausea, vomiting. Denies any calf pain, chest pain, SOB.   Objective:  General: Well developed, nourished, in no acute distress, alert and oriented x3   Dermatology: Skin is warm, dry and supple bilateral.  Left third nail border appears to be clean, dry, with mild granular tissue and surrounding scab. There is no surrounding erythema, edema, drainage/purulence. The remaining nails appear unremarkable at this time. There are no other lesions or other signs of infection present.  Neurovascular status: Intact. No lower extremity swelling; No pain with calf compression bilateral.  Musculoskeletal: Decreased tenderness to palpation of the left third toe muscular strength within normal limits bilateral.   Assesement and Plan: S/p partial nail avulsion, doing well.   -Continue soaking in epsom salts twice a day followed by antibiotic ointment and a band-aid. Can leave uncovered at night. Continue this until completely healed.  -If the area has not healed in 2 weeks, call the office for follow-up appointment, or sooner if any problems arise.  -Monitor for any signs/symptoms of infection. Call the office immediately if any occur or go directly to the emergency room. Call with any questions/concerns.  Celesta Gentile, DPM

## 2020-01-05 ENCOUNTER — Ambulatory Visit: Payer: PPO | Admitting: Physical Therapy

## 2020-01-14 ENCOUNTER — Ambulatory Visit: Payer: PPO | Attending: Internal Medicine

## 2020-01-14 DIAGNOSIS — Z23 Encounter for immunization: Secondary | ICD-10-CM

## 2020-01-14 NOTE — Progress Notes (Signed)
   Covid-19 Vaccination Clinic  Name:  Beth Simon    MRN: [id number] DOB: 1940/08/25  01/14/2020  Ms. Care was observed post Covid-19 immunization for 15 minutes without incident. She was provided with Vaccine Information Sheet and instruction to access the V-Safe system.   [name] was instructed to call 911 with any severe reactions post vaccine: Marland Kitchen Difficulty breathing  . Swelling of face and throat  . A fast heartbeat  . A bad rash all over body  . Dizziness and weakness

## 2020-01-19 ENCOUNTER — Ambulatory Visit: Payer: Self-pay | Admitting: Podiatry

## 2020-01-19 ENCOUNTER — Ambulatory Visit: Payer: PPO | Admitting: Podiatry

## 2020-01-20 ENCOUNTER — Ambulatory Visit: Payer: PPO | Admitting: Podiatry

## 2020-01-20 ENCOUNTER — Other Ambulatory Visit: Payer: Self-pay

## 2020-01-20 DIAGNOSIS — Q828 Other specified congenital malformations of skin: Secondary | ICD-10-CM | POA: Diagnosis not present

## 2020-01-20 DIAGNOSIS — B351 Tinea unguium: Secondary | ICD-10-CM

## 2020-01-20 DIAGNOSIS — M79676 Pain in unspecified toe(s): Secondary | ICD-10-CM | POA: Diagnosis not present

## 2020-01-20 NOTE — Progress Notes (Signed)
Subjective: 79 y.o. returns the office today for painful, elongated, thickened toenails which she is unable to trim herself as well as for callus. The procedure site on the left 3rd toe is doing well and not having any redness, drainage. Denies any ulcerations.  No swelling or redness.  She has no other concerns today.  Objective: AAO 3, NAD DP/PT pulses palpable, CRT less than 3 seconds Nails mildly hypertrophic, dystrophic, elongated, brittle, discolored 10. There is tenderness overlying the nails 1-5 bilaterally. There is no surrounding erythema or drainage along the nail sites.  Medial aspect of the left third toenail site appears to be healed.  Minimal edema.  No drainage or pus or ascending cellulitis. Hyperkeratotic lesions of the distal aspect left second digit as well as the dorsal lateral aspect of the fifth digit left foot. No underlying ulceration, drainage or other signs of infection.  No open lesions or other pre-ulcerative lesions are identified. No pain with calf compression, swelling, warmth, erythema. Overall, no changes   Assessment: Symptomatic onychomycosis; hyperkeratotic lesion left foot  Plan: -Treatment options including alternatives, risks, complications were discussed -Nails sharply debrided 10 without complication/bleeding. -Hyperkeratotic lesion sharply debrided 1 without complication/bleeding. -Follow-up in 4 weeks at her request or sooner if any problems are to arise. In the meantime, encouraged to call the office with any questions, concerns, changes symptoms.  Celesta Gentile, DPM

## 2020-02-15 DIAGNOSIS — Z23 Encounter for immunization: Secondary | ICD-10-CM | POA: Diagnosis not present

## 2020-02-20 ENCOUNTER — Ambulatory Visit: Payer: PPO | Admitting: Podiatry

## 2020-02-20 ENCOUNTER — Other Ambulatory Visit: Payer: Self-pay

## 2020-02-20 DIAGNOSIS — Q828 Other specified congenital malformations of skin: Secondary | ICD-10-CM | POA: Diagnosis not present

## 2020-02-20 DIAGNOSIS — M79676 Pain in unspecified toe(s): Secondary | ICD-10-CM | POA: Diagnosis not present

## 2020-02-20 DIAGNOSIS — B351 Tinea unguium: Secondary | ICD-10-CM | POA: Diagnosis not present

## 2020-02-27 ENCOUNTER — Ambulatory Visit: Payer: PPO | Attending: Obstetrics and Gynecology | Admitting: Physical Therapy

## 2020-02-27 ENCOUNTER — Other Ambulatory Visit: Payer: Self-pay

## 2020-02-27 ENCOUNTER — Ambulatory Visit: Payer: PPO | Admitting: Physical Therapy

## 2020-02-27 ENCOUNTER — Encounter: Payer: Self-pay | Admitting: Physical Therapy

## 2020-02-27 DIAGNOSIS — N3942 Incontinence without sensory awareness: Secondary | ICD-10-CM | POA: Diagnosis not present

## 2020-02-27 DIAGNOSIS — M6281 Muscle weakness (generalized): Secondary | ICD-10-CM

## 2020-02-27 DIAGNOSIS — R278 Other lack of coordination: Secondary | ICD-10-CM

## 2020-02-27 DIAGNOSIS — R159 Full incontinence of feces: Secondary | ICD-10-CM | POA: Diagnosis not present

## 2020-02-27 NOTE — Therapy (Signed)
[facility] [address] [place], [place], [address] Phone: [phone]   Fax:  [phone]  Physical Therapy Evaluation  Patient Details  Name: Beth Simon MRN: [id number] Date of Birth: 07/04/1940 Referring Provider (PT): Dr. Aundria Rud   Encounter Date: 02/27/2020   PT End of Session - 02/27/20 1059    Visit Number 1    Date for PT Re-Evaluation 05/21/20    Authorization Type [employer]    PT Start Time 1100    PT Stop Time 1140    PT Time Calculation (min) 40 min    Activity Tolerance Patient tolerated treatment well    Behavior During Therapy [facility] for tasks assessed/performed           Past Medical History:  Diagnosis Date   Adenomatous polyps 01/2016   Anemia    Breast cancer ([place]) 1990   bilateral mastectomy, adenoca breast-left MRM, reconstruction, chemo   Bronchitis last 2 weeks   saw dr Felipa Eth 02-28-2014, he said no antibiotics needed, nonproductive cough   Complication of anesthesia    Cystitis    cytoxen, had once or twice   Family history of breast cancer    History of breast cancer    History of radiation therapy 2/10, 2/12, 2/18, 2/24, 05/29/14   vaginal cuff/ 30 Gy/ 5 fx   Hypertension    Macular degeneration    Neck pain    taking physictal therapy last 2 weeks   Osteoporosis    On Prolia.   PONV (postoperative nausea and vomiting)    Shoulder pain    taking physical therapy for last few weeks   Uterine cancer ([place]) 03/21/2014   MLH1/PMS2 LOH   Uterine fibroid    VAIN I (vaginal intraepithelial neoplasia grade I) 2018   positive HR HPV.   Vasovagal syncopes     Past Surgical History:  Procedure Laterality Date   BREAST IMPLANT REMOVAL  6/94   left breast   BREAST RECONSTRUCTION  1990   left   CATARACT EXTRACTION Bilateral    EXCISION VAGINAL CYST     MASTECTOMY Right 10/08/06   prophylactic   RADICAL MASTECTOMY LND   1990   left, chemo done   ROBOTIC ASSISTED TOTAL HYSTERECTOMY WITH BILATERAL SALPINGO OOPHERECTOMY Bilateral 03/21/2014   Procedure: ROBOTIC ASSISTED TOTAL HYSTERECTOMY WITH BILATERAL SALPINGO OOPHORECTOMY ;  Surgeon: Janie Morning, MD;  Location: [facility];  Service: Gynecology;  Laterality: Bilateral;    There were no vitals filed for this visit.    Subjective Assessment - 02/27/20 1104    Subjective I have issues with the tightness in the chest from all of the scar. I lose 1-2 drops of urine when cough. When stand or sit for a long time fecal leakage will happen.I am still doing the exericses.    Patient Stated Goals increased chest mobility, reduce leakage    Currently in Pain? Yes    Pain Score 7     Pain Location Thoracic    Pain Orientation Posterior;Lower    Pain Descriptors / Indicators Sharp    Pain Type Acute pain    Pain Onset More than a month ago    Pain Frequency Intermittent    Aggravating Factors  laying on back to exercise then sit up    Pain Relieving Factors wait it out    Multiple Pain Sites No              [facility] PT  Assessment - 02/27/20 0001      Assessment   Medical Diagnosis R32 urinary incontinence, unspecified type; R15.9 Incontinence of feces, unspedified fecal incontinence    Referring Provider (PT) Dr. Aundria Rud    Onset Date/Surgical Date --   chronic   Prior Therapy yes for the leakage      Precautions   Precautions Other (comment)    Precaution Comments breast and urterine cancer with radiation treatment      Restrictions   Weight Bearing Restrictions No      Balance Screen   Has the patient fallen in the past 6 months No    Has the patient had a decrease in activity level because of a fear of falling?  No    Is the patient reluctant to leave their home because of a fear of falling?  No      Home Ecologist residence      Prior Function   Level of Independence Independent    Leisure  pilates      Cognition   Overall Cognitive Status Within Functional Limits for tasks assessed      Observation/Other Assessments   Observations measure around the chest without breath at breast line at 90 cm and inhale 91 cm; did outside t-shirt    Skin Integrity scars from masectomy are limited    Focus on Therapeutic Outcomes (FOTO)  PFIQ-7 43 pts; CRAIQ 29%; UIQ-7 14 %      Posture/Postural Control   Posture/Postural Control Postural limitations    Postural Limitations Forward head;Rounded Shoulders;Increased thoracic kyphosis;Posterior pelvic tilt      ROM / Strength   AROM / PROM / Strength AROM;PROM;Strength      AROM   Lumbar Flexion decreased by 25% with tightness in the lumbar paraspinals    Lumbar Extension decreased by 25%    Lumbar - Right Side Bend decreased by 25%    Lumbar - Left Side Bend decreased by 25%    Lumbar - Right Rotation decreased by 25%    Lumbar - Left Rotation decreased by 25%      Strength   Overall Strength Comments able to engage the lower abdoment but will flare the lower rib cage    Right Hip ABduction 3+/5    Left Hip ABduction 4/5      Palpation   SI assessment  ASIS are equal    Palpation comment decreased mobility of lower and bid rib cage due to increased flare, tightness in bilateral rib cage                      Objective measurements completed on examination: See above findings.     Pelvic Floor Special Questions - 02/27/20 0001    Urinary Leakage Yes    Activities that cause leaking Coughing   leak 1-2 drops   Fecal incontinence Yes    Skin Integrity Intact   anus slightly gapping   Pelvic Floor Internal Exam Patient confirms identification and approves Pt to assess and treatment    Exam Type Rectal    Palpation tightness in the perineal body, along the sphincter and anterior levator ani    Strength fair squeeze, definite lift   after some manual work to the anus then increased to 4/5                      PT Short Term Goals - 02/27/20 1102  PT SHORT TERM GOAL #1   Title be independent in initial HEP    Time 4    Period Weeks    Status On-going    Target Date 03/26/20      PT SHORT TERM GOAL #2   Title fecal leakage decreased >/= 25% due to improve strength and coordination    Time 4    Period Weeks    Status On-going    Target Date 03/26/20             PT Long Term Goals - 02/27/20 1235      PT LONG TERM GOAL #1   Title be independent in advanced HEP    Baseline ----    Time 12    Period Weeks    Status New    Target Date 05/21/20      PT LONG TERM GOAL #2   Title fecal leakage decreased >/= 75% due to improved strength and endurance     Time 12    Status New    Target Date 05/21/20      PT LONG TERM GOAL #3   Title able to lay on the mat to do her pilates then get up with pain decreased >/= 75% due to increased rib cage mobility    Time 12    Period Weeks    Status New      PT LONG TERM GOAL #4   Title urinary leakage with coughing and sneezing decreased >/= 50%    Time 12    Period Weeks    Status New    Target Date 05/21/20                  Plan - 02/27/20 1100    Clinical Impression Statement Patient is a 79 year old female with urinary and fecal incontinence and difficulty with chest expansion from masectomy scars. Patient has a history of breast cancer with masectomy scars limiting rib cage expansion to move the diaphragm for continence. Rib cage at the breast line is 90 cm and with inhalation is 91 cm. Patient has tightness in the lower and mid rib cage due to scar. Patients lower ribs are outflared, increased thoracic kyphosis, tight pectoralis, rounded shoulders and forward head. Pelvic floor strength is 3/5 but after some manual work to the pelvic floor strength increased to 4/5 holding for 10 seconds. Patient has difficulty with urinating when having a bowel movement. Patient will leak urine with coughing. Patient  will leak stool with long time of standing and walking. If she ahs the urge and has to wait will leak stool. Bilateral hip abduction is weak. Patient is able to cotract the lower abdomen but will suck in her breath and lift her rib cage with flaring the lower ribs. Patient has 7/10 pain in the lower thoracic and rib cage after laying on the back to do her pilates and get up. Patient will benefit from skilled therapy to lack of coordination of the pelvic floor, breathing, and decreased pain with movement.    Personal Factors and Comorbidities Age;Comorbidity 3+;Time since onset of injury/illness/exacerbation;Fitness    Comorbidities breast cancer with masectomy; urterine with radiation, osteoprosis    Examination-Activity Limitations Locomotion Level;Stand;Toileting;Continence    Examination-Participation Restrictions Community Activity    Stability/Clinical Decision Making Evolving/Moderate complexity    Clinical Decision Making Moderate    Rehab Potential Excellent    PT Frequency 1x / week    PT Duration 12 weeks  PT Treatment/Interventions ADLs/Self Care Home Management;Biofeedback;Therapeutic activities;Therapeutic exercise;Neuromuscular re-education;Patient/family education;Manual techniques;Scar mobilization    PT Next Visit Plan work on the chest scars, work on upper abdomen to reduce the outflare of ribs, see how the pelvic floor contraction is going; soft tissue work to the lumbar to losen the tissue    Consulted and Agree with Plan of Care Patient           Patient will benefit from skilled therapeutic intervention in order to improve the following deficits and impairments:  Decreased coordination, Decreased range of motion, Increased fascial restricitons, Increased muscle spasms, Pain, Decreased scar mobility, Decreased strength  Visit Diagnosis: Other lack of coordination - Plan: PT plan of care cert/re-cert  Muscle weakness (generalized) - Plan: PT plan of care  cert/re-cert  Incontinence of feces, unspecified fecal incontinence type - Plan: PT plan of care cert/re-cert  Urinary incontinence without sensory awareness - Plan: PT plan of care cert/re-cert     Problem List Patient Active Problem List   Diagnosis Date Noted   Transient cerebral ischemia 09/15/2017   Pure hypercholesterolemia 09/15/2017   Pulmonary nodule 09/15/2017   Hypertension 09/15/2017   Gastro-esophageal reflux disease without esophagitis 09/15/2017   VAIN I (vaginal intraepithelial neoplasia grade I) 06/23/2017   Genetic testing 05/23/2014   History of breast cancer    Family history of breast cancer    Endometrial cancer ([place]) 03/21/2014   Uterine cancer ([place]) 03/21/2014   S/P mastectomy, bilateral 05/02/2013   Situational stress 05/02/2013   Osteoporosis, unspecified 05/02/2013   Breast cancer, left breast ([place]) 05/02/2013    Earlie Counts, PT 02/27/20 1:28 PM   [facility] [address] [place], [place], [address] Phone: [phone]   Fax:  [phone]  Name: Beth Simon MRN: [id number] Date of Birth: Feb 18, 1941

## 2020-02-28 NOTE — Progress Notes (Signed)
Subjective: 79 y.o. returns the office today for painful, elongated, thickened toenails which she is unable to trim herself as well as for callus.  Denies any ulcerations.  No swelling or redness.  She has no other concerns today.  Objective: AAO 3, NAD DP/PT pulses palpable, CRT less than 3 seconds Nails mildly hypertrophic, dystrophic, elongated, brittle, discolored 10. There is tenderness overlying the nails 1-5 bilaterally. There is no surrounding erythema or drainage along the nail sites. Hyperkeratotic lesions of the distal aspect left second digit as well as the dorsal lateral aspect of the fifth digit left foot. No underlying ulceration, drainage or other signs of infection.  No open lesions or other pre-ulcerative lesions are identified. No pain with calf compression, swelling, warmth, erythema. Overall, no changes   Assessment: Symptomatic onychomycosis; hyperkeratotic lesion left foot  Plan: -Treatment options including alternatives, risks, complications were discussed -Nails sharply debrided 10 without complication/bleeding. -Hyperkeratotic lesion sharply debrided 1 without complication/bleeding. -Follow-up in 4 weeks at her request or sooner if any problems are to arise. In the meantime, encouraged to call the office with any questions, concerns, changes symptoms.  Celesta Gentile, DPM

## 2020-03-05 ENCOUNTER — Encounter: Payer: Self-pay | Admitting: Physical Therapy

## 2020-03-05 ENCOUNTER — Other Ambulatory Visit: Payer: Self-pay

## 2020-03-05 ENCOUNTER — Ambulatory Visit: Payer: PPO | Attending: Obstetrics and Gynecology | Admitting: Physical Therapy

## 2020-03-05 DIAGNOSIS — M6281 Muscle weakness (generalized): Secondary | ICD-10-CM

## 2020-03-05 DIAGNOSIS — R278 Other lack of coordination: Secondary | ICD-10-CM | POA: Diagnosis not present

## 2020-03-05 DIAGNOSIS — R159 Full incontinence of feces: Secondary | ICD-10-CM

## 2020-03-05 DIAGNOSIS — N3942 Incontinence without sensory awareness: Secondary | ICD-10-CM | POA: Insufficient documentation

## 2020-03-05 NOTE — Therapy (Signed)
[facility] [address], [address] [place], [place], [address] Phone: [phone]   Fax:  [phone]  Physical Therapy Treatment  Patient Details  Name: Beth Simon MRN: [id number] Date of Birth: Aug 22, 1940 Referring Provider (PT): Dr. Janean Sark   Encounter Date: 03/05/2020   PT End of Session - 03/05/20 1315    Visit Number 2    Date for PT Re-Evaluation 05/21/20    Authorization Type he[employer]    PT Start Time 1100    PT Stop Time 1140    PT Time Calculation (min) 40 min    Activity Tolerance Patient tolerated treatment well    Behavior During Therapy [facility] for tasks assessed/performed           Past Medical History:  Diagnosis Date  . Adenomatous polyps 01/2016  . Anemia   . Breast cancer (HCC) 1990   bilateral mastectomy, adenoca breast-left MRM, reconstruction, chemo  . Bronchitis last 2 weeks   saw dr Pete Glatter 02-28-2014, he said no antibiotics needed, nonproductive cough  . Complication of anesthesia   . Cystitis    cytoxen, had once or twice  . Family history of breast cancer   . History of breast cancer   . History of radiation therapy 2/10, 2/12, 2/18, 2/24, 05/29/14   vaginal cuff/ 30 Gy/ 5 fx  . Hypertension   . Macular degeneration   . Neck pain    taking physictal therapy last 2 weeks  . Osteoporosis    On Prolia.  Marland Kitchen PONV (postoperative nausea and vomiting)   . Shoulder pain    taking physical therapy for last few weeks  . Uterine cancer (HCC) 03/21/2014   MLH1/PMS2 LOH  . Uterine fibroid   . VAIN I (vaginal intraepithelial neoplasia grade I) 2018   positive HR HPV.  . Vasovagal syncopes     Past Surgical History:  Procedure Laterality Date  . BREAST IMPLANT REMOVAL  6/94   left breast  . BREAST RECONSTRUCTION  1990   left  . CATARACT EXTRACTION Bilateral   . EXCISION VAGINAL CYST    . MASTECTOMY Right 10/08/06   prophylactic  . RADICAL MASTECTOMY LND   1990   left, chemo done  . ROBOTIC ASSISTED TOTAL HYSTERECTOMY WITH BILATERAL SALPINGO OOPHERECTOMY Bilateral 03/21/2014   Procedure: ROBOTIC ASSISTED TOTAL HYSTERECTOMY WITH BILATERAL SALPINGO OOPHORECTOMY ;  Surgeon: Laurette Schimke, MD;  Location: [facility];  Service: Gynecology;  Laterality: Bilateral;    There were no vitals filed for this visit.   Subjective Assessment - 03/05/20 1105    Subjective I feel fine. Ihave not had fecal leakage this week.    Patient Stated Goals increased chest mobility, reduce leakage    Pain Score 7     Pain Location Thoracic    Pain Orientation Posterior;Lower    Pain Descriptors / Indicators Sharp    Pain Type Acute pain    Pain Onset More than a month ago    Pain Frequency Intermittent    Aggravating Factors  laying on back to exercise then sit up    Pain Relieving Factors wait it out                             [facility] Adult PT Treatment/Exercise - 03/05/20 0001      Lumbar Exercises: Supine   Ab Set 20 reps;1 second    AB Set Limitations tactile cues to  open up the back rib cage and not hinge a the TL junction    Isometric Hip Flexion 10 reps;5 seconds    Isometric Hip Flexion Limitations each leg with abdominal bracing      Manual Therapy   Manual Therapy Soft tissue mobilization;Myofascial release    Soft tissue mobilization posterior intercoastals, lumbar and thoracic paraspinals    Myofascial Release using the suction cup on the thoracic and lumbar area to pull the skin up off the muscles; tissue rolling of the thoracic and lumbar area and lateral trunk                  PT Education - 03/05/20 1314    Education Details Access Code: [email or web address]    Person(s) Educated Patient    Methods Explanation;Demonstration;Verbal cues;Handout    Comprehension Returned demonstration;Verbalized understanding            PT Short Term Goals - 02/27/20 1102      PT SHORT TERM GOAL #1   Title be independent in initial HEP     Time 4    Period Weeks    Status On-going    Target Date 03/26/20      PT SHORT TERM GOAL #2   Title fecal leakage decreased >/= 25% due to improve strength and coordination    Time 4    Period Weeks    Status On-going    Target Date 03/26/20             PT Long Term Goals - 02/27/20 1235      PT LONG TERM GOAL #1   Title be independent in advanced HEP    Baseline ----    Time 12    Period Weeks    Status New    Target Date 05/21/20      PT LONG TERM GOAL #2   Title fecal leakage decreased >/= 75% due to improved strength and endurance     Time 12    Status New    Target Date 05/21/20      PT LONG TERM GOAL #3   Title able to lay on the mat to do her pilates then get up with pain decreased >/= 75% due to increased rib cage mobility    Time 12    Period Weeks    Status New      PT LONG TERM GOAL #4   Title urinary leakage with coughing and sneezing decreased >/= 50%    Time 12    Period Weeks    Status New    Target Date 05/21/20                 Plan - 03/05/20 1138    Clinical Impression Statement Patient had increased tissue mobility of the thoracic lumbar area after manual work. She needs tactile cues to not extend at the thoracic lumbar junction. Patient is able to reduce the outflare with abdominal contraction correctly today. Patient has not had fecal leakage since last visit. Patient will benefit from skilled therapy to improve pelvic floor coordinaiton, breathing, and decreasd pain with movement.    Personal Factors and Comorbidities Age;Comorbidity 3+;Time since onset of injury/illness/exacerbation;Fitness    Comorbidities breast cancer with masectomy; urterine with radiation, osteoprosis    Examination-Activity Limitations Locomotion Level;Stand;Toileting;Continence    Examination-Participation Restrictions Community Activity    Stability/Clinical Decision Making Evolving/Moderate complexity    Rehab Potential Excellent    PT Frequency 1x /  week  PT Duration 12 weeks    PT Treatment/Interventions ADLs/Self Care Home Management;Biofeedback;Therapeutic activities;Therapeutic exercise;Neuromuscular re-education;Patient/family education;Manual techniques;Scar mobilization    PT Next Visit Plan work on core exercises that are cross body to engage the obliques, continue with the soft tissue work, foam roll to the thoracic    PT Home Exercise Plan Access Code: [email or web address]    Recommended Other Services MD signed the initial eval    Consulted and Agree with Plan of Care Patient           Patient will benefit from skilled therapeutic intervention in order to improve the following deficits and impairments:  Decreased coordination, Decreased range of motion, Increased fascial restricitons, Increased muscle spasms, Pain, Decreased scar mobility, Decreased strength  Visit Diagnosis: Other lack of coordination  Muscle weakness (generalized)  Incontinence of feces, unspecified fecal incontinence type  Urinary incontinence without sensory awareness     Problem List Patient Active Problem List   Diagnosis Date Noted  . Transient cerebral ischemia 09/15/2017  . Pure hypercholesterolemia 09/15/2017  . Pulmonary nodule 09/15/2017  . Hypertension 09/15/2017  . Gastro-esophageal reflux disease without esophagitis 09/15/2017  . VAIN I (vaginal intraepithelial neoplasia grade I) 06/23/2017  . Genetic testing 05/23/2014  . History of breast cancer   . Family history of breast cancer   . Endometrial cancer ([place]) 03/21/2014  . Uterine cancer ([place]) 03/21/2014  . S/P mastectomy, bilateral 05/02/2013  . Situational stress 05/02/2013  . Osteoporosis, unspecified 05/02/2013  . Breast cancer, left breast ([place]) 05/02/2013    Earlie Counts, PT 03/05/20 1:16 PM   [facility] [address], [place], [place], [address] Phone: [phone]   Fax:  [phone]  Name: ADAMA IVINS MRN: [id number] Date of Birth: [birth year]

## 2020-03-05 NOTE — Patient Instructions (Signed)
Access Code: [email or web address] URL: [email or web address] Date: 03/05/2020 Prepared by: Earlie Counts  Exercises Hooklying Isometric Hip Flexion - 1 x daily - 7 x weekly - 1 sets - 10 reps Standing Trunk Rotation with Resistance - 1 x daily - 7 x weekly - 2 sets - 10 reps [facility] [address], [place], [place] [address] Phone # [phone] Fax [phone]

## 2020-03-19 ENCOUNTER — Encounter: Payer: Self-pay | Admitting: Podiatry

## 2020-03-19 ENCOUNTER — Ambulatory Visit: Payer: PPO | Admitting: Podiatry

## 2020-03-19 ENCOUNTER — Encounter: Payer: Self-pay | Admitting: Physical Therapy

## 2020-03-19 ENCOUNTER — Ambulatory Visit: Payer: PPO | Admitting: Physical Therapy

## 2020-03-19 ENCOUNTER — Other Ambulatory Visit: Payer: Self-pay

## 2020-03-19 DIAGNOSIS — M79676 Pain in unspecified toe(s): Secondary | ICD-10-CM | POA: Diagnosis not present

## 2020-03-19 DIAGNOSIS — R278 Other lack of coordination: Secondary | ICD-10-CM | POA: Diagnosis not present

## 2020-03-19 DIAGNOSIS — Z860100 Personal history of colon polyps, unspecified: Secondary | ICD-10-CM | POA: Insufficient documentation

## 2020-03-19 DIAGNOSIS — R159 Full incontinence of feces: Secondary | ICD-10-CM

## 2020-03-19 DIAGNOSIS — B351 Tinea unguium: Secondary | ICD-10-CM

## 2020-03-19 DIAGNOSIS — M810 Age-related osteoporosis without current pathological fracture: Secondary | ICD-10-CM | POA: Insufficient documentation

## 2020-03-19 DIAGNOSIS — M6281 Muscle weakness (generalized): Secondary | ICD-10-CM

## 2020-03-19 DIAGNOSIS — N3942 Incontinence without sensory awareness: Secondary | ICD-10-CM

## 2020-03-19 DIAGNOSIS — Q828 Other specified congenital malformations of skin: Secondary | ICD-10-CM

## 2020-03-19 NOTE — Therapy (Signed)
[facility] [address], [place], [place], [address] Phone: [phone]   Fax:  [phone]  Physical Therapy Treatment  Patient Details  Name: Beth Simon MRN: [id number] Date of Birth: [birth year] Referring Provider (PT): Dr. Aundria Rud   Encounter Date: 03/19/2020   PT End of Session - 03/19/20 1224    Visit Number 3    Date for PT Re-Evaluation 05/21/20    Authorization Type [employer]alt[employer]    PT Start Time 1145    PT Stop Time 1223    PT Time Calculation (min) 38 min    Activity Tolerance Patient tolerated treatment well    Behavior During Therapy [facility] for tasks assessed/performed           Past Medical History:  Diagnosis Date  . Adenomatous polyps 01/2016  . Anemia   . Breast cancer ([place]) 1990   bilateral mastectomy, adenoca breast-left MRM, reconstruction, chemo  . Bronchitis last 2 weeks   saw dr Felipa Eth 02-28-2014, he said no antibiotics needed, nonproductive cough  . Complication of anesthesia   . Cystitis    cytoxen, had once or twice  . Family history of breast cancer   . History of breast cancer   . History of radiation therapy 2/10, 2/12, 2/18, 2/24, 05/29/14   vaginal cuff/ 30 Gy/ 5 fx  . Hypertension   . Macular degeneration   . Neck pain    taking physictal therapy last 2 weeks  . Osteoporosis    On Prolia.  Marland Kitchen PONV (postoperative nausea and vomiting)   . Shoulder pain    taking physical therapy for last few weeks  . Uterine cancer ([place]) 03/21/2014   MLH1/PMS2 LOH  . Uterine fibroid   . VAIN I (vaginal intraepithelial neoplasia grade I) 2018   positive HR HPV.  . Vasovagal syncopes     Past Surgical History:  Procedure Laterality Date  . BREAST IMPLANT REMOVAL  6/94   left breast  . BREAST RECONSTRUCTION  1990   left  . CATARACT EXTRACTION Bilateral   . EXCISION VAGINAL CYST    . MASTECTOMY Right 10/08/06   prophylactic  . RADICAL MASTECTOMY LND   1990   left, chemo done  . ROBOTIC ASSISTED TOTAL HYSTERECTOMY WITH BILATERAL SALPINGO OOPHERECTOMY Bilateral 03/21/2014   Procedure: ROBOTIC ASSISTED TOTAL HYSTERECTOMY WITH BILATERAL SALPINGO OOPHORECTOMY ;  Surgeon: Janie Morning, MD;  Location: [facility];  Service: Gynecology;  Laterality: Bilateral;    There were no vitals filed for this visit.   Subjective Assessment - 03/19/20 1151    Subjective My diet is not good and I have not been looking after myselft this week. The rectal leakage is worse since I have not been eating properly. Not straining to have a bowel movment.The stool has been runny lately. The thoracic pain is doing better. I was sore after you worked on me.    Patient Stated Goals increased chest mobility, reduce leakage    Currently in Pain? No/denies                             [facility] Adult PT Treatment/Exercise - 03/19/20 0001      Lumbar Exercises: Standing   Other Standing Lumbar Exercises stand and hold rid band rotation to the right 20x the left without moving hips to work the core; stand tall bringing the band forward and back with tension from the side 20x  each way working the core      Lumbar Exercises: Supine   Other Supine Lumbar Exercises lay thoracic spine on the foam roll to increased extension at different levels, lay on side on foam roll moving up and down then static stretch with breath on right then left      Manual Therapy   Manual Therapy Myofascial release    Myofascial Release using the suction cup to release the fascia anterior mid trunk, lateral trunk and back                    PT Short Term Goals - 03/19/20 1229      PT SHORT TERM GOAL #1   Title be independent in initial HEP    Time 4    Period Weeks    Status Achieved      PT SHORT TERM GOAL #2   Title fecal leakage decreased >/= 25% due to improve strength and coordination    Baseline worse since she has not been eating healthy for the holidays    Time  4    Period Weeks    Status On-going             PT Long Term Goals - 03/19/20 1229      PT LONG TERM GOAL #1   Title be independent in advanced HEP    Time 12    Period Weeks    Status On-going      PT LONG TERM GOAL #2   Title fecal leakage decreased >/= 75% due to improved strength and endurance     Time 12    Period Weeks    Status On-going      PT LONG TERM GOAL #3   Title able to lay on the mat to do her pilates then get up with pain decreased >/= 75% due to increased rib cage mobility    Time 12    Period Weeks      PT LONG TERM GOAL #4   Title urinary leakage with coughing and sneezing decreased >/= 50%    Baseline better than last week    Time 12    Period Weeks    Status On-going                 Plan - 03/19/20 1225    Clinical Impression Statement Patient has noticed increased fecal leakage if she does not eat healthy and her stool is looser. Patient has less pain in thoracic when she is getting up from a flat surface. Patient has increased mobility of thoracic tissue. Patient has increased rib mobility. She is able to engage the abdominals with anal sphincter contraction in standing. Patient will benefit from skilled therapy to improve pelvic floor coordination, breathing, and decreased pain with movement.    Personal Factors and Comorbidities Age;Comorbidity 3+;Time since onset of injury/illness/exacerbation;Fitness    Comorbidities breast cancer with masectomy; urterine with radiation, osteoprosis    Examination-Activity Limitations Locomotion Level;Stand;Toileting;Continence    Examination-Participation Restrictions Community Activity    Stability/Clinical Decision Making Evolving/Moderate complexity    Rehab Potential Excellent    PT Duration 12 weeks    PT Treatment/Interventions ADLs/Self Care Home Management;Biofeedback;Therapeutic activities;Therapeutic exercise;Neuromuscular re-education;Patient/family education;Manual techniques;Scar  mobilization    PT Next Visit Plan work on core exercises that are cross body to engage the obliques, continue with the soft tissue work, foam roll to the thoracic, anal contraction with pelvic floor EMG    PT Home Exercise  Plan Access Code: [email or web address]    Consulted and Agree with Plan of Care Patient           Patient will benefit from skilled therapeutic intervention in order to improve the following deficits and impairments:  Decreased coordination,Decreased range of motion,Increased fascial restricitons,Increased muscle spasms,Pain,Decreased scar mobility,Decreased strength  Visit Diagnosis: Other lack of coordination  Muscle weakness (generalized)  Incontinence of feces, unspecified fecal incontinence type  Urinary incontinence without sensory awareness     Problem List Patient Active Problem List   Diagnosis Date Noted  . Age-related osteoporosis without current pathological fracture 03/19/2020  . Personal history of colonic polyps 03/19/2020  . Transient cerebral ischemia 09/15/2017  . Pure hypercholesterolemia 09/15/2017  . Pulmonary nodule 09/15/2017  . Hypertension 09/15/2017  . Gastro-esophageal reflux disease without esophagitis 09/15/2017  . VAIN I (vaginal intraepithelial neoplasia grade I) 06/23/2017  . Genetic testing 05/23/2014  . History of breast cancer   . Family history of breast cancer   . Endometrial cancer ([place]) 03/21/2014  . Uterine cancer ([place]) 03/21/2014  . S/P mastectomy, bilateral 05/02/2013  . Situational stress 05/02/2013  . Osteoporosis, unspecified 05/02/2013  . Breast cancer, left breast ([place]) 05/02/2013    Earlie Counts, PT 03/19/20 12:31 PM   [facility] [address] [place], [place], [address] Phone: [phone]   Fax:  [phone]  Name: TERILYNN BURESH MRN: [id number] Date of Birth: [birth year]

## 2020-03-22 NOTE — Progress Notes (Signed)
Subjective: 79 y.o. returns the office today for painful, elongated, thickened toenails which she is unable to trim herself as well as for callus.  Ingrown toes have been doing well with any pain.  Denies any ulcerations.  No swelling or redness.  She has no other concerns today.  Objective: AAO 3, NAD DP/PT pulses palpable, CRT less than 3 seconds Nails mildly hypertrophic, dystrophic, elongated, brittle, discolored 10. There is tenderness overlying the nails 1-5 bilaterally. There is no surrounding erythema or drainage along the nail sites. Hyperkeratotic lesions of the distal aspect left second digit as well as the dorsal lateral aspect of the fifth digit left foot. No underlying ulceration, drainage or other signs of infection.  No open lesions or other pre-ulcerative lesions are identified. No pain with calf compression, swelling, warmth, erythema. Overall, no changes   Assessment: Symptomatic onychomycosis; hyperkeratotic lesion left foot  Plan: -Treatment options including alternatives, risks, complications were discussed -Nails sharply debrided 10 without complication/bleeding. -Hyperkeratotic lesion sharply debrided 1 without complication/bleeding. -Follow-up in 4 weeks at her request or sooner if any problems are to arise. In the meantime, encouraged to call the office with any questions, concerns, changes symptoms.  Celesta Gentile, DPM

## 2020-04-02 ENCOUNTER — Ambulatory Visit: Payer: Medicare HMO | Admitting: Physical Therapy

## 2020-04-09 ENCOUNTER — Encounter: Payer: PPO | Admitting: Physical Therapy

## 2020-04-16 ENCOUNTER — Ambulatory Visit: Payer: PPO | Admitting: Podiatry

## 2020-04-16 ENCOUNTER — Encounter: Payer: HMO | Admitting: Physical Therapy

## 2020-04-16 ENCOUNTER — Other Ambulatory Visit: Payer: PPO | Admitting: Orthotics

## 2020-04-19 ENCOUNTER — Encounter: Payer: Self-pay | Admitting: Physical Therapy

## 2020-04-19 ENCOUNTER — Other Ambulatory Visit: Payer: Self-pay

## 2020-04-19 ENCOUNTER — Ambulatory Visit: Payer: Medicare HMO | Attending: Obstetrics and Gynecology | Admitting: Physical Therapy

## 2020-04-19 DIAGNOSIS — R278 Other lack of coordination: Secondary | ICD-10-CM | POA: Diagnosis not present

## 2020-04-19 DIAGNOSIS — R159 Full incontinence of feces: Secondary | ICD-10-CM | POA: Diagnosis not present

## 2020-04-19 DIAGNOSIS — N3942 Incontinence without sensory awareness: Secondary | ICD-10-CM | POA: Insufficient documentation

## 2020-04-19 DIAGNOSIS — M6281 Muscle weakness (generalized): Secondary | ICD-10-CM | POA: Insufficient documentation

## 2020-04-19 NOTE — Therapy (Signed)
[facility] [address], [place], [place], [address] Phone: [phone]   Fax:  [phone]  Physical Therapy Treatment  Patient Details  Name: Beth Simon MRN: [id number] Date of Birth: Feb 06, 1941 Referring Provider (PT): Dr. Aundria Rud   Encounter Date: 04/19/2020   PT End of Session - 04/19/20 1614    Visit Number 4    Date for PT Re-Evaluation 05/21/20    Authorization Type [employer]    PT Start Time 1530    PT Stop Time 1610    PT Time Calculation (min) 40 min    Activity Tolerance Patient tolerated treatment well    Behavior During Therapy [facility] for tasks assessed/performed           Past Medical History:  Diagnosis Date  . Adenomatous polyps 01/2016  . Anemia   . Breast cancer ([place]) 1990   bilateral mastectomy, adenoca breast-left MRM, reconstruction, chemo  . Bronchitis last 2 weeks   saw dr Felipa Eth 02-28-2014, he said no antibiotics needed, nonproductive cough  . Complication of anesthesia   . Cystitis    cytoxen, had once or twice  . Family history of breast cancer   . History of breast cancer   . History of radiation therapy 2/10, 2/12, 2/18, 2/24, 05/29/14   vaginal cuff/ 30 Gy/ 5 fx  . Hypertension   . Macular degeneration   . Neck pain    taking physictal therapy last 2 weeks  . Osteoporosis    On Prolia.  Marland Kitchen PONV (postoperative nausea and vomiting)   . Shoulder pain    taking physical therapy for last few weeks  . Uterine cancer ([place]) 03/21/2014   MLH1/PMS2 LOH  . Uterine fibroid   . VAIN I (vaginal intraepithelial neoplasia grade I) 2018   positive HR HPV.  . Vasovagal syncopes     Past Surgical History:  Procedure Laterality Date  . BREAST IMPLANT REMOVAL  6/94   left breast  . BREAST RECONSTRUCTION  1990   left  . CATARACT EXTRACTION Bilateral   . EXCISION VAGINAL CYST    . MASTECTOMY Right 10/08/06   prophylactic  . RADICAL MASTECTOMY LND   1990   left, chemo done  . ROBOTIC ASSISTED TOTAL HYSTERECTOMY WITH BILATERAL SALPINGO OOPHERECTOMY Bilateral 03/21/2014   Procedure: ROBOTIC ASSISTED TOTAL HYSTERECTOMY WITH BILATERAL SALPINGO OOPHORECTOMY ;  Surgeon: Janie Morning, MD;  Location: [facility];  Service: Gynecology;  Laterality: Bilateral;    There were no vitals filed for this visit.   Subjective Assessment - 04/19/20 1533    Subjective The incontinence is related to eating chocolate. The thoracic pain has improved 25% with frequency. I get a cramp on the left lower rib cage.    Patient Stated Goals increased chest mobility, reduce leakage    Currently in Pain? Yes    Pain Score 7     Pain Location Thoracic    Pain Orientation Posterior;Lower    Pain Descriptors / Indicators Sharp    Pain Type Acute pain    Pain Onset More than a month ago    Pain Frequency Intermittent    Aggravating Factors  laying on back to exercise then sit up, bending forward to tie shoe, leaning forward then to the right    Pain Relieving Factors wait it out    Multiple Pain Sites No  [facility] Adult PT Treatment/Exercise - 04/19/20 0001      Lumbar Exercises: Sidelying   Other Sidelying Lumbar Exercises opening book move in sidely to improve rib mobility and expansion of the rib cage with breath      Manual Therapy   Manual Therapy Soft tissue mobilization;Myofascial release    Soft tissue mobilization along the lateral trunk and intercoastals to improve the rib mobility and tissue mobility to expand the rib cage and reduce muscle cramping    Myofascial Release using the suction cup an diastim to the lateral rib cage, abdominals, lateral trunk in sidely , tissue rolling to the same area                    PT Short Term Goals - 04/19/20 1539      PT SHORT TERM GOAL #1   Title be independent in initial HEP    Time 4    Period Weeks    Target Date 03/26/20      PT SHORT TERM GOAL #2    Title fecal leakage decreased >/= 25% due to improve strength and coordination    Baseline worse since she has not been eating healthy for the holidays    Time 4    Period Weeks    Status Achieved             PT Long Term Goals - 04/19/20 1540      PT LONG TERM GOAL #4   Title urinary leakage with coughing and sneezing decreased >/= 50%    Baseline able to stop the leakage with cough and sneeze if she contracts the pelvic floor voluntarily    Time 12    Period Weeks    Status On-going                 Plan - 04/19/20 1614    Clinical Impression Statement Patient will have fecal leakage when she eats chocolate. She reports her fecal and urinary leakage has improved by 25%. When she coughs and sneezes with a pelvic floor contraction she will not leak but if she does not contract she will leak. Patient continues to have fascial restrictions on the lateral trunk, intercoastals, and abdominal muscles causing cramp with supine to sitting and bending forward to tie shoes. Patient will benefit from skilled therapy to improve tissue mobility to reduce pain, improve pelvic floor coordination and function.    Personal Factors and Comorbidities Age;Comorbidity 3+;Time since onset of injury/illness/exacerbation;Fitness    Comorbidities breast cancer with masectomy; urterine with radiation, osteoprosis    Examination-Activity Limitations Locomotion Level;Stand;Toileting;Continence    Examination-Participation Restrictions Community Activity    Stability/Clinical Decision Making Evolving/Moderate complexity    Rehab Potential Excellent    PT Frequency 1x / week    PT Duration 12 weeks    PT Treatment/Interventions ADLs/Self Care Home Management;Biofeedback;Therapeutic activities;Therapeutic exercise;Neuromuscular re-education;Patient/family education;Manual techniques;Scar mobilization    PT Next Visit Plan work on core exercises that are cross body to engage the obliques, continue with the  soft tissue work, foam roll to the thoracic, anal contraction with pelvic floor EMG    PT Home Exercise Plan Access Code: [email or web address]    Consulted and Agree with Plan of Care Patient           Patient will benefit from skilled therapeutic intervention in order to improve the following deficits and impairments:  Decreased coordination,Decreased range of motion,Increased fascial restricitons,Increased muscle spasms,Pain,Decreased scar mobility,Decreased strength  Visit Diagnosis: Other lack  of coordination  Muscle weakness (generalized)  Incontinence of feces, unspecified fecal incontinence type  Urinary incontinence without sensory awareness     Problem List Patient Active Problem List   Diagnosis Date Noted  . Age-related osteoporosis without current pathological fracture 03/19/2020  . Personal history of colonic polyps 03/19/2020  . Transient cerebral ischemia 09/15/2017  . Pure hypercholesterolemia 09/15/2017  . Pulmonary nodule 09/15/2017  . Hypertension 09/15/2017  . Gastro-esophageal reflux disease without esophagitis 09/15/2017  . VAIN I (vaginal intraepithelial neoplasia grade I) 06/23/2017  . Genetic testing 05/23/2014  . History of breast cancer   . Family history of breast cancer   . Endometrial cancer ([place]) 03/21/2014  . Uterine cancer ([place]) 03/21/2014  . S/P mastectomy, bilateral 05/02/2013  . Situational stress 05/02/2013  . Osteoporosis, unspecified 05/02/2013  . Breast cancer, left breast ([place]) 05/02/2013    Earlie Counts, PT 04/19/20 4:18 PM   [facility] [address] [place], [place], [address] Phone: [phone]   Fax:  [phone]  Name: Beth Simon MRN: [id number] Date of Birth: [birth year]

## 2020-04-26 ENCOUNTER — Other Ambulatory Visit: Payer: Self-pay

## 2020-04-26 ENCOUNTER — Encounter: Payer: Self-pay | Admitting: Physical Therapy

## 2020-04-26 ENCOUNTER — Ambulatory Visit: Payer: Medicare HMO | Admitting: Physical Therapy

## 2020-04-26 DIAGNOSIS — M6281 Muscle weakness (generalized): Secondary | ICD-10-CM

## 2020-04-26 DIAGNOSIS — R278 Other lack of coordination: Secondary | ICD-10-CM | POA: Diagnosis not present

## 2020-04-26 DIAGNOSIS — N3942 Incontinence without sensory awareness: Secondary | ICD-10-CM | POA: Diagnosis not present

## 2020-04-26 DIAGNOSIS — R159 Full incontinence of feces: Secondary | ICD-10-CM

## 2020-04-26 NOTE — Therapy (Signed)
[facility] [address], [place], [place], [address] Phone: [phone]   Fax:  [phone]  Physical Therapy Treatment  Patient Details  Name: Beth Simon MRN: [id number] Date of Birth: 01-09-1941 Referring Provider (PT): Dr. Aundria Rud   Encounter Date: 04/26/2020   PT End of Session - 04/26/20 0941    Visit Number 5    Date for PT Re-Evaluation 05/21/20    Authorization Type [employer]    PT Start Time 0850    PT Stop Time 0932    PT Time Calculation (min) 42 min    Activity Tolerance Patient tolerated treatment well    Behavior During Therapy [facility] for tasks assessed/performed           Past Medical History:  Diagnosis Date  . Adenomatous polyps 01/2016  . Anemia   . Breast cancer ([place]) 1990   bilateral mastectomy, adenoca breast-left MRM, reconstruction, chemo  . Bronchitis last 2 weeks   saw dr Felipa Eth 02-28-2014, he said no antibiotics needed, nonproductive cough  . Complication of anesthesia   . Cystitis    cytoxen, had once or twice  . Family history of breast cancer   . History of breast cancer   . History of radiation therapy 2/10, 2/12, 2/18, 2/24, 05/29/14   vaginal cuff/ 30 Gy/ 5 fx  . Hypertension   . Macular degeneration   . Neck pain    taking physictal therapy last 2 weeks  . Osteoporosis    On Prolia.  Marland Kitchen PONV (postoperative nausea and vomiting)   . Shoulder pain    taking physical therapy for last few weeks  . Uterine cancer ([place]) 03/21/2014   MLH1/PMS2 LOH  . Uterine fibroid   . VAIN I (vaginal intraepithelial neoplasia grade I) 2018   positive HR HPV.  . Vasovagal syncopes     Past Surgical History:  Procedure Laterality Date  . BREAST IMPLANT REMOVAL  6/94   left breast  . BREAST RECONSTRUCTION  1990   left  . CATARACT EXTRACTION Bilateral   . EXCISION VAGINAL CYST    . MASTECTOMY Right 10/08/06   prophylactic  . RADICAL MASTECTOMY LND   1990   left, chemo done  . ROBOTIC ASSISTED TOTAL HYSTERECTOMY WITH BILATERAL SALPINGO OOPHERECTOMY Bilateral 03/21/2014   Procedure: ROBOTIC ASSISTED TOTAL HYSTERECTOMY WITH BILATERAL SALPINGO OOPHORECTOMY ;  Surgeon: Janie Morning, MD;  Location: [facility];  Service: Gynecology;  Laterality: Bilateral;    There were no vitals filed for this visit.   Subjective Assessment - 04/26/20 0853    Subjective I have not had a crmp for reaching for shoes. No fecal leakage since last visit. When I cough or sneeze I feel I may expel some urine but able to prevent the urine from leaking.    Patient Stated Goals increased chest mobility, reduce leakage    Currently in Pain? Yes    Pain Score 4     Pain Location Thoracic    Pain Orientation Posterior;Lower    Pain Descriptors / Indicators Sharp    Pain Type Acute pain    Pain Onset More than a month ago    Pain Frequency Intermittent    Aggravating Factors  laying on back to exercise then sit up, wake up and roll over to get up    Pain Relieving Factors wait it out    Multiple Pain Sites No  [facility] PT Assessment - 04/26/20 0001      Palpation   Palpation comment improved mobility of the lower rib cage and thoracic spine                         [facility] Adult PT Treatment/Exercise - 04/26/20 0001      Lumbar Exercises: Supine   Bent Knee Raise 20 reps;1 second    Bent Knee Raise Limitations while green loop band around the hands and bringing overhead; tactile cues to abdomen to contract top and bottom    Bridge with [employer] Limitations hold bridge with ball squeeze and pull band across body 10x each side to engage the obliques and pelvic floor    Other Supine Lumbar Exercises supine spinal decompression      Lumbar Exercises: Sidelying   Other Sidelying Lumbar Exercises lay on side pulling red band to the opposite leg to engage the obliques and to bring the rib cage downward with breath with anal contraction 15x  each side    Other Sidelying Lumbar Exercises laying on side of thoracic rolling out the lattisimus and serratus anterior with arm movemement ; then would bring the arm toward the cieling and bring overhead and stretch the thoracic doing on both sides      Manual Therapy   Manual Therapy Soft tissue mobilization    Soft tissue mobilization bilateral sides of the thoracic paraspinals to reduce the thoracic kyphosis                    PT Short Term Goals - 04/19/20 1539      PT SHORT TERM GOAL #1   Title be independent in initial HEP    Time 4    Period Weeks    Target Date 03/26/20      PT SHORT TERM GOAL #2   Title fecal leakage decreased >/= 25% due to improve strength and coordination    Baseline worse since she has not been eating healthy for the holidays    Time 4    Period Weeks    Status Achieved             PT Long Term Goals - 04/26/20 0944      PT LONG TERM GOAL #1   Title be independent in advanced HEP    Time 12    Period Weeks    Status On-going      PT LONG TERM GOAL #2   Title fecal leakage decreased >/= 75% due to improved strength and endurance     Baseline none since last week    Time 12    Period Weeks    Status On-going      PT LONG TERM GOAL #3   Title able to lay on the mat to do her pilates then get up with pain decreased >/= 75% due to increased rib cage mobility    Time 12    Period Weeks    Status On-going      PT LONG TERM GOAL #4   Title urinary leakage with coughing and sneezing decreased >/= 50%    Baseline able to stop the leakage with cough and sneeze if she contracts the pelvic floor voluntarily    Time 12    Period Weeks    Status Achieved                 Plan - 04/26/20 2376  Clinical Impression Statement Patient pain level has decreased from 7/10 to 4/10. Patient has not had fecal leakage since last visit. She is able to not leak urine with a cough if she braces herself. Patient is working on her  obliques with pelvic floor contraction to improve her strength. She is not getting the cramp in the thoracic area when she bends forward due to increased tissue mobility. Patient will benefit from skilled therapy to improve tissue mobility to reduce pain, improve pelvic floor coordination and function.    Personal Factors and Comorbidities Age;Comorbidity 3+;Time since onset of injury/illness/exacerbation;Fitness    Comorbidities breast cancer with masectomy; urterine with radiation, osteoprosis    Examination-Activity Limitations Locomotion Level;Stand;Toileting;Continence    Examination-Participation Restrictions Community Activity    Stability/Clinical Decision Making Evolving/Moderate complexity    Rehab Potential Excellent    PT Frequency 1x / week    PT Duration 12 weeks    PT Treatment/Interventions ADLs/Self Care Home Management;Biofeedback;Therapeutic activities;Therapeutic exercise;Neuromuscular re-education;Patient/family education;Manual techniques;Scar mobilization    PT Next Visit Plan go over the exercises from previous session, if patient continues to do well then possible discharge    PT Home Exercise Plan Access Code: [email or web address]    Consulted and Agree with Plan of Care Patient           Patient will benefit from skilled therapeutic intervention in order to improve the following deficits and impairments:  Decreased coordination,Decreased range of motion,Increased fascial restricitons,Increased muscle spasms,Pain,Decreased scar mobility,Decreased strength  Visit Diagnosis: Other lack of coordination  Muscle weakness (generalized)  Incontinence of feces, unspecified fecal incontinence type  Urinary incontinence without sensory awareness     Problem List Patient Active Problem List   Diagnosis Date Noted  . Age-related osteoporosis without current pathological fracture 03/19/2020  . Personal history of colonic polyps 03/19/2020  . Transient cerebral ischemia  09/15/2017  . Pure hypercholesterolemia 09/15/2017  . Pulmonary nodule 09/15/2017  . Hypertension 09/15/2017  . Gastro-esophageal reflux disease without esophagitis 09/15/2017  . VAIN I (vaginal intraepithelial neoplasia grade I) 06/23/2017  . Genetic testing 05/23/2014  . History of breast cancer   . Family history of breast cancer   . Endometrial cancer ([place]) 03/21/2014  . Uterine cancer ([place]) 03/21/2014  . S/P mastectomy, bilateral 05/02/2013  . Situational stress 05/02/2013  . Osteoporosis, unspecified 05/02/2013  . Breast cancer, left breast ([place]) 05/02/2013    Earlie Counts, PT 04/26/20 9:47 AM   [facility] [address], [place], [place], [address] Phone: [phone]   Fax:  [phone]  Name: Beth Simon MRN: [id number] Date of Birth: [birth year]

## 2020-04-27 DIAGNOSIS — Z79899 Other long term (current) drug therapy: Secondary | ICD-10-CM | POA: Diagnosis not present

## 2020-04-27 DIAGNOSIS — I10 Essential (primary) hypertension: Secondary | ICD-10-CM | POA: Diagnosis not present

## 2020-04-27 DIAGNOSIS — M25511 Pain in right shoulder: Secondary | ICD-10-CM | POA: Diagnosis not present

## 2020-04-30 ENCOUNTER — Other Ambulatory Visit: Payer: Self-pay

## 2020-04-30 ENCOUNTER — Encounter: Payer: PPO | Admitting: Physical Therapy

## 2020-04-30 ENCOUNTER — Ambulatory Visit (INDEPENDENT_AMBULATORY_CARE_PROVIDER_SITE_OTHER): Payer: Medicare HMO | Admitting: Podiatry

## 2020-04-30 ENCOUNTER — Other Ambulatory Visit: Payer: Medicare HMO | Admitting: Orthotics

## 2020-04-30 ENCOUNTER — Ambulatory Visit: Payer: Medicare HMO | Admitting: Physical Therapy

## 2020-04-30 DIAGNOSIS — M79676 Pain in unspecified toe(s): Secondary | ICD-10-CM

## 2020-04-30 DIAGNOSIS — Q828 Other specified congenital malformations of skin: Secondary | ICD-10-CM

## 2020-04-30 DIAGNOSIS — M778 Other enthesopathies, not elsewhere classified: Secondary | ICD-10-CM

## 2020-04-30 DIAGNOSIS — N3942 Incontinence without sensory awareness: Secondary | ICD-10-CM

## 2020-04-30 DIAGNOSIS — R278 Other lack of coordination: Secondary | ICD-10-CM | POA: Diagnosis not present

## 2020-04-30 DIAGNOSIS — M6281 Muscle weakness (generalized): Secondary | ICD-10-CM

## 2020-04-30 DIAGNOSIS — R159 Full incontinence of feces: Secondary | ICD-10-CM | POA: Diagnosis not present

## 2020-04-30 DIAGNOSIS — B351 Tinea unguium: Secondary | ICD-10-CM

## 2020-04-30 NOTE — Progress Notes (Signed)
Subjective: 80 y.o. returns the office today for painful, elongated, thickened toenails which she is unable to trim herself as well as for callus.  She is also scheduled to see Liliane Channel, pedorthotist, for inserts today.  She does like to a lot of walking she feels that she needs inserts for shoes.  Denies any ulcerations.  No swelling or redness.  She has no other concerns today.  Objective: AAO 3, NAD DP/PT pulses palpable, CRT less than 3 seconds Nails mildly hypertrophic, dystrophic, elongated, brittle, discolored 10. There is tenderness overlying the nails 1-5 bilaterally. There is no surrounding erythema or drainage along the nail sites. Hyperkeratotic lesions of the distal aspect left second digit as well as the dorsal lateral aspect of the fifth digit left foot. No underlying ulceration, drainage or other signs of infection.  No open lesions or other pre-ulcerative lesions are identified. No area of pinpoint tenderness identified at this time. No pain with calf compression, swelling, warmth, erythema. Overall, no changes   Assessment: Symptomatic onychomycosis; hyperkeratotic lesion left foot  Plan: -Treatment options including alternatives, risks, complications were discussed -Nails sharply debrided 10 without complication/bleeding. -Hyperkeratotic lesion sharply debrided 1 without complication/bleeding. -She saw Liliane Channel today for inserts. Powersteps dispensed.  -Follow-up in 4 weeks at her request or sooner if any problems are to arise. In the meantime, encouraged to call the office with any questions, concerns, changes symptoms.  Celesta Gentile, DPM

## 2020-04-30 NOTE — Therapy (Signed)
[facility] [address], [place], [place], [address] Phone: [phone]   Fax:  [phone]  Physical Therapy Treatment  Patient Details  Name: Beth Simon MRN: [id number] Date of Birth: Aug 24, 1940 Referring Provider (PT): Dr. Aundria Rud   Encounter Date: 04/30/2020   PT End of Session - 04/30/20 0804    Visit Number 6    Date for PT Re-Evaluation 05/21/20    Authorization Type [employer]    PT Start Time 0800    PT Stop Time 0840    PT Time Calculation (min) 40 min    Activity Tolerance Patient tolerated treatment well    Behavior During Therapy [facility] for tasks assessed/performed           Past Medical History:  Diagnosis Date  . Adenomatous polyps 01/2016  . Anemia   . Breast cancer ([place]) 1990   bilateral mastectomy, adenoca breast-left MRM, reconstruction, chemo  . Bronchitis last 2 weeks   saw dr Felipa Eth 02-28-2014, he said no antibiotics needed, nonproductive cough  . Complication of anesthesia   . Cystitis    cytoxen, had once or twice  . Family history of breast cancer   . History of breast cancer   . History of radiation therapy 2/10, 2/12, 2/18, 2/24, 05/29/14   vaginal cuff/ 30 Gy/ 5 fx  . Hypertension   . Macular degeneration   . Neck pain    taking physictal therapy last 2 weeks  . Osteoporosis    On Prolia.  Marland Kitchen PONV (postoperative nausea and vomiting)   . Shoulder pain    taking physical therapy for last few weeks  . Uterine cancer ([place]) 03/21/2014   MLH1/PMS2 LOH  . Uterine fibroid   . VAIN I (vaginal intraepithelial neoplasia grade I) 2018   positive HR HPV.  . Vasovagal syncopes     Past Surgical History:  Procedure Laterality Date  . BREAST IMPLANT REMOVAL  6/94   left breast  . BREAST RECONSTRUCTION  1990   left  . CATARACT EXTRACTION Bilateral   . EXCISION VAGINAL CYST    . MASTECTOMY Right 10/08/06   prophylactic  . RADICAL MASTECTOMY LND   1990   left, chemo done  . ROBOTIC ASSISTED TOTAL HYSTERECTOMY WITH BILATERAL SALPINGO OOPHERECTOMY Bilateral 03/21/2014   Procedure: ROBOTIC ASSISTED TOTAL HYSTERECTOMY WITH BILATERAL SALPINGO OOPHORECTOMY ;  Surgeon: Janie Morning, MD;  Location: [facility];  Service: Gynecology;  Laterality: Bilateral;    There were no vitals filed for this visit.   Subjective Assessment - 04/30/20 0804    Subjective I am still controlling the leakage. No night time rectal leakage. I had some cramping on the left lower rib cage.    Patient Stated Goals increased chest mobility, reduce leakage    Currently in Pain? Yes    Pain Score 2     Pain Location Thoracic    Pain Orientation Posterior;Lower    Pain Descriptors / Indicators Sharp    Pain Type Acute pain    Pain Onset More than a month ago    Pain Frequency Intermittent    Aggravating Factors  laying on back to exercise then sit up, wake up and roll over to get up    Pain Relieving Factors wait it out    Multiple Pain Sites No              [facility] PT Assessment - 04/30/20 0001      Assessment  Medical Diagnosis R32 urinary incontinence, unspecified type; R15.9 Incontinence of feces, unspedified fecal incontinence    Referring Provider (PT) Dr. Aundria Rud    Prior Therapy yes for the leakage      Precautions   Precautions Other (comment)    Precaution Comments breast and urterine cancer with radiation treatment      [place] residence      Prior Function   Level of Independence Independent      Cognition   Overall Cognitive Status Within Functional Limits for tasks assessed      ROM / Strength   AROM / PROM / Strength AROM;PROM;Strength      AROM   Lumbar Flexion full    Lumbar Extension decreased by 25%    Lumbar - Right Side Bend full    Lumbar - Left Side Bend full    Lumbar - Right Rotation decreased by 25%    Lumbar - Left Rotation full                          [facility] Adult PT Treatment/Exercise - 04/30/20 0001      Lumbar Exercises: Supine   Bent Knee Raise 20 reps;1 second    Bent Knee Raise Limitations holding red band 10x on one side and 10x the other side to work the oblques and tactile cues to reduce the lumbar lordosis    Bridge with [employer] 20 reps    Bridge with [employer] Limitations 10 x holding red band to incorporated the obliques      Manual Therapy   Manual Therapy Soft tissue mobilization;Myofascial release    Soft tissue mobilization left diaphragm, left lower intercoastals and obliques    Myofascial Release using sucrion cup on the left lateral rib cage  while laying on soft pummel; tisse rolling of the left lateral trunk anf left lower rib cage  and anterior abdominals                    PT Short Term Goals - 04/19/20 1539      PT SHORT TERM GOAL #1   Title be independent in initial HEP    Time 4    Period Weeks    Target Date 03/26/20      PT SHORT TERM GOAL #2   Title fecal leakage decreased >/= 25% due to improve strength and coordination    Baseline worse since she has not been eating healthy for the holidays    Time 4    Period Weeks    Status Achieved             PT Long Term Goals - 04/26/20 0944      PT LONG TERM GOAL #1   Title be independent in advanced HEP    Time 12    Period Weeks    Status On-going      PT LONG TERM GOAL #2   Title fecal leakage decreased >/= 75% due to improved strength and endurance     Baseline none since last week    Time 12    Period Weeks    Status On-going      PT LONG TERM GOAL #3   Title able to lay on the mat to do her pilates then get up with pain decreased >/= 75% due to increased rib cage mobility    Time 12    Period  Weeks    Status On-going      PT LONG TERM GOAL #4   Title urinary leakage with coughing and sneezing decreased >/= 50%    Baseline able to stop the leakage with cough and sneeze if she  contracts the pelvic floor voluntarily    Time 12    Period Weeks    Status Achieved                 Plan - 04/30/20 0844    Clinical Impression Statement Patient has improved lumbar ROM. Patient needs tactile cues to incorporate the abdominals and flatten the lumbar spine. Patient is not having the fecal leakage at night. Patient is able to control her urinary leakage if she contracts with the activity. Patient still has tightness int he lateral trunk and mid abdominals. Patient will benefit from skilled therapy to improve tissue mobility to reduce pain, improve pelvic floor coordination and function.    Personal Factors and Comorbidities Age;Comorbidity 3+;Time since onset of injury/illness/exacerbation;Fitness    Comorbidities breast cancer with masectomy; urterine with radiation, osteoprosis    Examination-Activity Limitations Locomotion Level;Stand;Toileting;Continence    Examination-Participation Restrictions Community Activity    Stability/Clinical Decision Making Evolving/Moderate complexity    Rehab Potential Excellent    PT Frequency 1x / week    PT Duration 12 weeks    PT Treatment/Interventions ADLs/Self Care Home Management;Biofeedback;Therapeutic activities;Therapeutic exercise;Neuromuscular re-education;Patient/family education;Manual techniques;Scar mobilization    PT Next Visit Plan continues with manual work and progressing her HEP    PT Home Exercise Plan Access Code: [email or web address]    Consulted and Agree with Plan of Care Patient           Patient will benefit from skilled therapeutic intervention in order to improve the following deficits and impairments:  Decreased coordination,Decreased range of motion,Increased fascial restricitons,Increased muscle spasms,Pain,Decreased scar mobility,Decreased strength  Visit Diagnosis: Other lack of coordination  Muscle weakness (generalized)  Incontinence of feces, unspecified fecal incontinence type  Urinary incontinence  without sensory awareness     Problem List Patient Active Problem List   Diagnosis Date Noted  . Age-related osteoporosis without current pathological fracture 03/19/2020  . Personal history of colonic polyps 03/19/2020  . Transient cerebral ischemia 09/15/2017  . Pure hypercholesterolemia 09/15/2017  . Pulmonary nodule 09/15/2017  . Hypertension 09/15/2017  . Gastro-esophageal reflux disease without esophagitis 09/15/2017  . VAIN I (vaginal intraepithelial neoplasia grade I) 06/23/2017  . Genetic testing 05/23/2014  . History of breast cancer   . Family history of breast cancer   . Endometrial cancer ([place]) 03/21/2014  . Uterine cancer ([place]) 03/21/2014  . S/P mastectomy, bilateral 05/02/2013  . Situational stress 05/02/2013  . Osteoporosis, unspecified 05/02/2013  . Breast cancer, left breast ([place]) 05/02/2013    Earlie Counts, PT 04/30/20 8:47 AM   [facility] [address] [place], [place], [address] Phone: [phone]   Fax:  [phone]  Name: Beth Simon MRN: [id number] Date of Birth: [birth year]

## 2020-05-08 DIAGNOSIS — M25511 Pain in right shoulder: Secondary | ICD-10-CM | POA: Diagnosis not present

## 2020-05-09 ENCOUNTER — Ambulatory Visit: Payer: Medicare HMO | Attending: Obstetrics and Gynecology | Admitting: Physical Therapy

## 2020-05-09 ENCOUNTER — Encounter: Payer: Self-pay | Admitting: Physical Therapy

## 2020-05-09 ENCOUNTER — Other Ambulatory Visit: Payer: Self-pay

## 2020-05-09 DIAGNOSIS — M6281 Muscle weakness (generalized): Secondary | ICD-10-CM | POA: Diagnosis not present

## 2020-05-09 DIAGNOSIS — R278 Other lack of coordination: Secondary | ICD-10-CM | POA: Insufficient documentation

## 2020-05-09 DIAGNOSIS — N3942 Incontinence without sensory awareness: Secondary | ICD-10-CM | POA: Diagnosis not present

## 2020-05-09 DIAGNOSIS — R159 Full incontinence of feces: Secondary | ICD-10-CM | POA: Diagnosis not present

## 2020-05-09 NOTE — Patient Instructions (Signed)
Access Code: [email or web address] URL: [email or web address] Date: 05/09/2020 Prepared by: Earlie Counts  Exercises Hooklying Isometric Hip Flexion - 1 x daily - 7 x weekly - 1 sets - 10 reps Standing Trunk Rotation with Resistance - 1 x daily - 7 x weekly - 2 sets - 10 reps Mid-Thoracic Spine Mobilization with Taped Tennis Balls Shoulder Flexion at Wall - 1 x daily - 7 x weekly - 1 sets - 10 reps Standing Diagonal Chop - 1 x daily - 7 x weekly - 1 sets - 10 reps [facility] [address] [place], [place] [address] Phone # [phone] Fax [phone]

## 2020-05-09 NOTE — Therapy (Signed)
[facility] [address], [place], [place], [address] Phone: [phone]   Fax:  [phone]  Physical Therapy Treatment  Patient Details  Name: Beth Simon MRN: [id number] Date of Birth: [birth year] Referring Provider (PT): Dr. Aundria Rud   Encounter Date: 05/09/2020   PT End of Session - 05/09/20 1234    Visit Number 7    Date for PT Re-Evaluation 05/21/20    Authorization Type [employer]alt[employer] advantage    PT Start Time 1230    PT Stop Time 1308    PT Time Calculation (min) 38 min    Activity Tolerance Patient tolerated treatment well    Behavior During Therapy [facility] for tasks assessed/performed           Past Medical History:  Diagnosis Date  . Adenomatous polyps 01/2016  . Anemia   . Breast cancer ([place]) 1990   bilateral mastectomy, adenoca breast-left MRM, reconstruction, chemo  . Bronchitis last 2 weeks   saw dr Felipa Eth 02-28-2014, he said no antibiotics needed, nonproductive cough  . Complication of anesthesia   . Cystitis    cytoxen, had once or twice  . Family history of breast cancer   . History of breast cancer   . History of radiation therapy 2/10, 2/12, 2/18, 2/24, 05/29/14   vaginal cuff/ 30 Gy/ 5 fx  . Hypertension   . Macular degeneration   . Neck pain    taking physictal therapy last 2 weeks  . Osteoporosis    On Prolia.  Marland Kitchen PONV (postoperative nausea and vomiting)   . Shoulder pain    taking physical therapy for last few weeks  . Uterine cancer ([place]) 03/21/2014   MLH1/PMS2 LOH  . Uterine fibroid   . VAIN I (vaginal intraepithelial neoplasia grade I) 2018   positive HR HPV.  . Vasovagal syncopes     Past Surgical History:  Procedure Laterality Date  . BREAST IMPLANT REMOVAL  6/94   left breast  . BREAST RECONSTRUCTION  1990   left  . CATARACT EXTRACTION Bilateral   . EXCISION VAGINAL CYST    . MASTECTOMY Right 10/08/06   prophylactic  . RADICAL MASTECTOMY LND  1990    left, chemo done  . ROBOTIC ASSISTED TOTAL HYSTERECTOMY WITH BILATERAL SALPINGO OOPHERECTOMY Bilateral 03/21/2014   Procedure: ROBOTIC ASSISTED TOTAL HYSTERECTOMY WITH BILATERAL SALPINGO OOPHORECTOMY ;  Surgeon: Janie Morning, MD;  Location: [facility];  Service: Gynecology;  Laterality: Bilateral;    There were no vitals filed for this visit.   Subjective Assessment - 05/09/20 1235    Subjective I feel pretty good overall. I saw the shoulder specialist. I had a right shoulder shot for my pain. Monday night I was going to the bathroom 6 times per day and messed up my underwear. The stool was looser. I have not had the pain this week in my back.    Patient Stated Goals increased chest mobility, reduce leakage    Currently in Pain? No/denies                             [facility] Adult PT Treatment/Exercise - 05/09/20 0001      Lumbar Exercises: Standing   Other Standing Lumbar Exercises use a tennis ball to massage the    Other Standing Lumbar Exercises stand and pull band diagonally and down to work the obliques 10 each side with red band  Manual Therapy   Manual Therapy Soft tissue mobilization;Myofascial release    Soft tissue mobilization left diaphragm, left lower intercoastals and obliques, left quadratus    Myofascial Release using sucrion cup on the left lateral rib cage  while laying on soft pummel; tisse rolling of the left lateral trunk anf left lower rib cage  and anterior abdominals                  PT Education - 05/09/20 1312    Education Details Access Code: [email or web address]    Methods Explanation;Demonstration;Verbal cues;Handout    Comprehension Returned demonstration;Verbalized understanding            PT Short Term Goals - 04/19/20 1539      PT SHORT TERM GOAL #1   Title be independent in initial HEP    Time 4    Period Weeks    Target Date 03/26/20      PT SHORT TERM GOAL #2   Title fecal leakage decreased >/= 25% due to improve  strength and coordination    Baseline worse since she has not been eating healthy for the holidays    Time 4    Period Weeks    Status Achieved             PT Long Term Goals - 05/09/20 1244      PT LONG TERM GOAL #1   Title be independent in advanced HEP    Time 12    Status On-going      PT LONG TERM GOAL #2   Title fecal leakage decreased >/= 75% due to improved strength and endurance     Baseline had a little due to lose stools.    Time 12    Period Weeks    Status On-going      PT LONG TERM GOAL #3   Title able to lay on the mat to do her pilates then get up with pain decreased >/= 75% due to increased rib cage mobility    Baseline 80% better    Time 12    Period Weeks    Status On-going      PT LONG TERM GOAL #4   Title urinary leakage with coughing and sneezing decreased >/= 50%    Baseline able to stop the leakage with cough and sneeze if she contracts the pelvic floor voluntarily    Time 12    Period Weeks    Status Achieved                 Plan - 05/09/20 1235    Clinical Impression Statement Patient has had improvement of the thoracic by 80%. Patient has improve tissue mobiity of the thoracic and the skin over the thoracic. Patient has learned new exercises to work the obliques and pelvic floor. Patient had some fecal incontinence when she had loose stool the other week. She will only leak urine when she does not contract the pelvic floor prior to coughing, laughing. Patient will benefit from skilled therapy    Personal Factors and Comorbidities Age;Comorbidity 3+;Time since onset of injury/illness/exacerbation;Fitness    Comorbidities breast cancer with masectomy; urterine with radiation, osteoprosis    Examination-Activity Limitations Locomotion Level;Stand;Toileting;Continence    Examination-Participation Restrictions Community Activity    Stability/Clinical Decision Making Evolving/Moderate complexity    Rehab Potential Excellent    PT Frequency  1x / week    PT Duration 12 weeks    PT Treatment/Interventions ADLs/Self Care Home Management;Biofeedback;Therapeutic activities;Therapeutic  exercise;Neuromuscular re-education;Patient/family education;Manual techniques;Scar mobilization    PT Next Visit Plan continues with manual work and progressing her HEP; possible discharge    PT Home Exercise Plan Access Code: [email or web address]    Consulted and Agree with Plan of Care Patient           Patient will benefit from skilled therapeutic intervention in order to improve the following deficits and impairments:  Decreased coordination,Decreased range of motion,Increased fascial restricitons,Increased muscle spasms,Pain,Decreased scar mobility,Decreased strength  Visit Diagnosis: Other lack of coordination  Muscle weakness (generalized)  Incontinence of feces, unspecified fecal incontinence type  Urinary incontinence without sensory awareness     Problem List Patient Active Problem List   Diagnosis Date Noted  . Age-related osteoporosis without current pathological fracture 03/19/2020  . Personal history of colonic polyps 03/19/2020  . Transient cerebral ischemia 09/15/2017  . Pure hypercholesterolemia 09/15/2017  . Pulmonary nodule 09/15/2017  . Hypertension 09/15/2017  . Gastro-esophageal reflux disease without esophagitis 09/15/2017  . VAIN I (vaginal intraepithelial neoplasia grade I) 06/23/2017  . Genetic testing 05/23/2014  . History of breast cancer   . Family history of breast cancer   . Endometrial cancer ([place]) 03/21/2014  . Uterine cancer ([place]) 03/21/2014  . S/P mastectomy, bilateral 05/02/2013  . Situational stress 05/02/2013  . Osteoporosis, unspecified 05/02/2013  . Breast cancer, left breast ([place]) 05/02/2013    Earlie Counts, PT 05/09/20 1:21 PM   [facility] [address] [place], [place], [address] Phone: [phone]   Fax:  [phone]  Name:  JASZMINE NAVEJAS MRN: [id number] Date of Birth: 1940-12-03

## 2020-05-21 ENCOUNTER — Ambulatory Visit: Payer: Medicare HMO | Admitting: Physical Therapy

## 2020-05-21 ENCOUNTER — Encounter: Payer: Self-pay | Admitting: Physical Therapy

## 2020-05-21 ENCOUNTER — Other Ambulatory Visit: Payer: Self-pay

## 2020-05-21 DIAGNOSIS — N3942 Incontinence without sensory awareness: Secondary | ICD-10-CM

## 2020-05-21 DIAGNOSIS — R278 Other lack of coordination: Secondary | ICD-10-CM | POA: Diagnosis not present

## 2020-05-21 DIAGNOSIS — M6281 Muscle weakness (generalized): Secondary | ICD-10-CM | POA: Diagnosis not present

## 2020-05-21 DIAGNOSIS — R159 Full incontinence of feces: Secondary | ICD-10-CM

## 2020-05-21 NOTE — Therapy (Signed)
[facility] [address], [place], [place], [address] Phone: [phone]   Fax:  [phone]  Physical Therapy Treatment  Patient Details  Name: RIATA IKEDA MRN: [id number] Date of Birth: 04-28-1940 Referring Provider (PT): Dr. Aundria Rud   Encounter Date: 05/21/2020   PT End of Session - 05/21/20 1229    Visit Number 8    Date for PT Re-Evaluation 05/21/20    Authorization Type [employer]    PT Start Time 1153   came late   PT Stop Time 1225    PT Time Calculation (min) 32 min    Activity Tolerance Patient tolerated treatment well    Behavior During Therapy [facility] for tasks assessed/performed           Past Medical History:  Diagnosis Date  . Adenomatous polyps 01/2016  . Anemia   . Breast cancer ([place]) 1990   bilateral mastectomy, adenoca breast-left MRM, reconstruction, chemo  . Bronchitis last 2 weeks   saw dr Felipa Eth 02-28-2014, he said no antibiotics needed, nonproductive cough  . Complication of anesthesia   . Cystitis    cytoxen, had once or twice  . Family history of breast cancer   . History of breast cancer   . History of radiation therapy 2/10, 2/12, 2/18, 2/24, 05/29/14   vaginal cuff/ 30 Gy/ 5 fx  . Hypertension   . Macular degeneration   . Neck pain    taking physictal therapy last 2 weeks  . Osteoporosis    On Prolia.  Marland Kitchen PONV (postoperative nausea and vomiting)   . Shoulder pain    taking physical therapy for last few weeks  . Uterine cancer ([place]) 03/21/2014   MLH1/PMS2 LOH  . Uterine fibroid   . VAIN I (vaginal intraepithelial neoplasia grade I) 2018   positive HR HPV.  . Vasovagal syncopes     Past Surgical History:  Procedure Laterality Date  . BREAST IMPLANT REMOVAL  6/94   left breast  . BREAST RECONSTRUCTION  1990   left  . CATARACT EXTRACTION Bilateral   . EXCISION VAGINAL CYST    . MASTECTOMY Right 10/08/06   prophylactic  . RADICAL  MASTECTOMY LND  1990   left, chemo done  . ROBOTIC ASSISTED TOTAL HYSTERECTOMY WITH BILATERAL SALPINGO OOPHERECTOMY Bilateral 03/21/2014   Procedure: ROBOTIC ASSISTED TOTAL HYSTERECTOMY WITH BILATERAL SALPINGO OOPHORECTOMY ;  Surgeon: Janie Morning, MD;  Location: [facility];  Service: Gynecology;  Laterality: Bilateral;    There were no vitals filed for this visit.   Subjective Assessment - 05/21/20 1153    Subjective Back is doing okay. I had a cramp in the upper abdominal when trying on my pants.    Patient Stated Goals increased chest mobility, reduce leakage    Currently in Pain? No/denies              [facility] PT Assessment - 05/21/20 0001      Assessment   Medical Diagnosis R32 urinary incontinence, unspecified type; R15.9 Incontinence of feces, unspedified fecal incontinence    Referring Provider (PT) Dr. Aundria Rud    Prior Therapy yes for the leakage      Precautions   Precautions Other (comment)    Precaution Comments breast and urterine cancer with radiation treatment      [place] residence      Prior Function   Level of Independence Independent  Cognition   Overall Cognitive Status Within Functional Limits for tasks assessed      Observation/Other Assessments   Focus on Therapeutic Outcomes (FOTO)  PFIQ-7 29 pts; CRAIQ 5%; UIQ-7 24 %      AROM   Lumbar Flexion full    Lumbar Extension full    Lumbar - Right Side Bend full    Lumbar - Left Side Bend full    Lumbar - Right Rotation full    Lumbar - Left Rotation full      Strength   Right Hip ABduction 4/5    Left Hip ABduction 4+/5                         [facility] Adult PT Treatment/Exercise - 05/21/20 0001      Self-Care   Self-Care Other Self-Care Comments    Other Self-Care Comments  education on different tools for soft tissue work to the rib cage and tools that are easy for her to manuever with her hands      Manual Therapy    Manual Therapy Myofascial release    Manual therapy comments educated patient on how to  mobilize the soft tissue herself at home and using the devices on her tissue    Myofascial Release tissue rolling of the left rib cage and anterior abdominal.                  PT Education - 05/21/20 1223    Education Details educated patient on different massage tools to use to mobilize the soft tissue    Person(s) Educated Patient    Methods Explanation;Demonstration;Handout    Comprehension Returned demonstration;Verbalized understanding            PT Short Term Goals - 05/21/20 1155      PT SHORT TERM GOAL #1   Title be independent in initial HEP    Time 4    Period Weeks    Status Achieved    Target Date 03/26/20      PT SHORT TERM GOAL #2   Title fecal leakage decreased >/= 25% due to improve strength and coordination    Baseline ==    Time 4    Period Weeks    Status Achieved    Target Date 03/26/20             PT Long Term Goals - 05/21/20 1155      PT LONG TERM GOAL #1   Title be independent in advanced HEP    Time 12    Period Weeks    Status Achieved      PT LONG TERM GOAL #2   Title fecal leakage decreased >/= 75% due to improved strength and endurance     Time 12    Period Weeks    Status Achieved      PT LONG TERM GOAL #3   Title able to lay on the mat to do her pilates then get up with pain decreased >/= 75% due to increased rib cage mobility    Time 12    Period Weeks    Status Achieved      PT LONG TERM GOAL #4   Title urinary leakage with coughing and sneezing decreased >/= 50%    Time 12    Period Weeks    Status Achieved                 Plan - 05/21/20 1230  Clinical Impression Statement Patient has met all of her goals. Patient has full lumbar ROM due to improve tissue mobility. Patient understands different tools to use to mobilize the tissue to make it easier on her hands. Patient urinary leakage is 80% better and fecal  leakage is 80% better and happens when she eats chocolate. Patient is able to contract her pelvic floor and feel the lift of the pelvic floor. Patient is able to control her urinary leakage by contraction prior to sneezing or laughing. Patient is independent with her HEP.    Personal Factors and Comorbidities Age;Comorbidity 3+;Time since onset of injury/illness/exacerbation;Fitness    Comorbidities breast cancer with masectomy; urterine with radiation, osteoprosis    Examination-Activity Limitations Locomotion Level;Stand;Toileting;Continence    Examination-Participation Restrictions Community Activity    Stability/Clinical Decision Making Evolving/Moderate complexity    Rehab Potential Excellent    PT Treatment/Interventions ADLs/Self Care Home Management;Biofeedback;Therapeutic activities;Therapeutic exercise;Neuromuscular re-education;Patient/family education;Manual techniques;Scar mobilization    PT Next Visit Plan discharge to HEP    PT Home Exercise Plan Access Code: [email or web address]    Consulted and Agree with Plan of Care Patient           Patient will benefit from skilled therapeutic intervention in order to improve the following deficits and impairments:  Decreased coordination,Decreased range of motion,Increased fascial restricitons,Increased muscle spasms,Pain,Decreased scar mobility,Decreased strength  Visit Diagnosis: Other lack of coordination  Muscle weakness (generalized)  Incontinence of feces, unspecified fecal incontinence type  Urinary incontinence without sensory awareness     Problem List Patient Active Problem List   Diagnosis Date Noted  . Age-related osteoporosis without current pathological fracture 03/19/2020  . Personal history of colonic polyps 03/19/2020  . Transient cerebral ischemia 09/15/2017  . Pure hypercholesterolemia 09/15/2017  . Pulmonary nodule 09/15/2017  . Hypertension 09/15/2017  . Gastro-esophageal reflux disease without esophagitis  09/15/2017  . VAIN I (vaginal intraepithelial neoplasia grade I) 06/23/2017  . Genetic testing 05/23/2014  . History of breast cancer   . Family history of breast cancer   . Endometrial cancer ([place]) 03/21/2014  . Uterine cancer ([place]) 03/21/2014  . S/P mastectomy, bilateral 05/02/2013  . Situational stress 05/02/2013  . Osteoporosis, unspecified 05/02/2013  . Breast cancer, left breast ([place]) 05/02/2013    Earlie Counts, PT 05/21/20 12:51 PM   [facility] [address] [place], [place], [address] Phone: [phone]   Fax:  [phone]  Name: MARIROSE DEVENEY MRN: [id number] Date of Birth: 1940/04/17  PHYSICAL THERAPY DISCHARGE SUMMARY  Visits from Start of Care: 8  Current functional level related to goals / functional outcomes: See above.    Remaining deficits: See above.    Education / Equipment: HEP Plan: Patient agrees to discharge.  Patient goals were met. Patient is being discharged due to meeting the stated rehab goals.  Thank you for the referral. Earlie Counts, PT 05/21/20 1:15 PM  ?????

## 2020-05-21 NOTE — Patient Instructions (Addendum)
   Scalp massager shampoo brush    Mini hand held massager   Tissue rolling along the rib cage and abdominals    soft tissue massage suction  [facility] [address] [place], [place] [address] Phone # [phone] Fax [phone]

## 2020-05-23 ENCOUNTER — Encounter: Payer: Self-pay | Admitting: Physical Therapy

## 2020-05-23 ENCOUNTER — Other Ambulatory Visit: Payer: Self-pay

## 2020-05-23 ENCOUNTER — Ambulatory Visit: Payer: Medicare HMO | Attending: Orthopaedic Surgery | Admitting: Physical Therapy

## 2020-05-23 DIAGNOSIS — M25511 Pain in right shoulder: Secondary | ICD-10-CM | POA: Insufficient documentation

## 2020-05-23 DIAGNOSIS — M6281 Muscle weakness (generalized): Secondary | ICD-10-CM | POA: Insufficient documentation

## 2020-05-23 DIAGNOSIS — R293 Abnormal posture: Secondary | ICD-10-CM | POA: Diagnosis not present

## 2020-05-23 DIAGNOSIS — G8929 Other chronic pain: Secondary | ICD-10-CM | POA: Insufficient documentation

## 2020-05-23 NOTE — Patient Instructions (Signed)
Access Code: [email or web address] URL: [email or web address] Date: 05/23/2020 Prepared by: Venetia Night Ali Mohl  Exercises Doorway Pec Stretch at 90 Degrees Abduction - 1 x daily - 7 x weekly - 1 sets - 3 reps - 20 hold Supine Shoulder Flexion Extension AAROM with Dowel - 1 x daily - 7 x weekly - 1 sets - 10 reps Supine Cervical Retraction with Towel - 1 x daily - 7 x weekly - 1 sets - 10 reps - 5 hold Seated Scapular Retraction - 1 x daily - 7 x weekly - 1 sets - 10 reps - 5 hold

## 2020-05-23 NOTE — Therapy (Signed)
[facility] [address], [place], [place], [address] Phone: [phone]   Fax:  [phone]  Physical Therapy Evaluation  Patient Details  Name: Beth Simon MRN: [id number] Date of Birth: 07-Feb-1941 Referring Provider (PT): Hiram Gash, MD   Encounter Date: 05/23/2020   PT End of Session - 05/23/20 0935    Visit Number 1    Date for PT Re-Evaluation 08/15/20    Authorization Type [employer]           Past Medical History:  Diagnosis Date  . Adenomatous polyps 01/2016  . Anemia   . Breast cancer ([place]) 1990   bilateral mastectomy, adenoca breast-left MRM, reconstruction, chemo  . Bronchitis last 2 weeks   saw dr Felipa Eth 02-28-2014, he said no antibiotics needed, nonproductive cough  . Complication of anesthesia   . Cystitis    cytoxen, had once or twice  . Family history of breast cancer   . History of breast cancer   . History of radiation therapy 2/10, 2/12, 2/18, 2/24, 05/29/14   vaginal cuff/ 30 Gy/ 5 fx  . Hypertension   . Macular degeneration   . Neck pain    taking physictal therapy last 2 weeks  . Osteoporosis    On Prolia.  Marland Kitchen PONV (postoperative nausea and vomiting)   . Shoulder pain    taking physical therapy for last few weeks  . Uterine cancer ([place]) 03/21/2014   MLH1/PMS2 LOH  . Uterine fibroid   . VAIN I (vaginal intraepithelial neoplasia grade I) 2018   positive HR HPV.  . Vasovagal syncopes     Past Surgical History:  Procedure Laterality Date  . BREAST IMPLANT REMOVAL  6/94   left breast  . BREAST RECONSTRUCTION  1990   left  . CATARACT EXTRACTION Bilateral   . EXCISION VAGINAL CYST    . MASTECTOMY Right 10/08/06   prophylactic  . RADICAL MASTECTOMY LND  1990   left, chemo done  . ROBOTIC ASSISTED TOTAL HYSTERECTOMY WITH BILATERAL SALPINGO OOPHERECTOMY Bilateral 03/21/2014   Procedure: ROBOTIC ASSISTED TOTAL HYSTERECTOMY WITH BILATERAL SALPINGO OOPHORECTOMY ;   Surgeon: Janie Morning, MD;  Location: [facility];  Service: Gynecology;  Laterality: Bilateral;    There were no vitals filed for this visit.    Subjective Assessment - 05/23/20 0937    Subjective Pt referred to OPPT with diagnosis of Rt RC tendonitis.  She had an injection 2 weeks ago which has helped significantly.  Slow onset since Nov/Dec 2021 and then worsened after driving 5 hours to see family.  Had pain with lifting arm to shoulder height.    Pertinent History Rt shoulder cortisone injection 2 weeks ago with signif relief    Diagnostic tests xray Rt shoulder: good GH joint space, mild/mod AC joint arthritis    Patient Stated Goals continue to have relief and prevent return of pain    Currently in Pain? No/denies    Pain Score --   was 8/10 before injeciton   Pain Location Shoulder    Pain Orientation Right    Pain Descriptors / Indicators Sharp    Pain Type Acute pain    Pain Onset More than a month ago    Pain Frequency Occasional    Aggravating Factors  before injection: lifting and reaching to shoulder height or above    Pain Relieving Factors injection has helped reduce pain to nearly nothing  [facility] PT Assessment - 05/23/20 0001      Assessment   Medical Diagnosis M75.81 (ICD-10-CM) - Right rotator cuff tendonitis    Referring Provider (PT) Hiram Gash, MD    Onset Date/Surgical Date --   Nov 2021   Hand Dominance Right    Next MD Visit as needed, MD will call to check on her    Prior Therapy for pelvic floor      Precautions   Precautions None      Restrictions   Weight Bearing Restrictions No      Balance Screen   Has the patient fallen in the past 6 months No      [place] residence      Prior Function   Level of Independence Independent      Cognition   Overall Cognitive Status Within Functional Limits for tasks assessed      Observation/Other Assessments   Focus on Therapeutic Outcomes (FOTO)   65% goal 67%      Posture/Postural Control   Posture/Postural Control Postural limitations    Postural Limitations Increased thoracic kyphosis;Forward head;Rounded Shoulders      ROM / Strength   AROM / PROM / Strength AROM;Strength;PROM      AROM   Overall AROM Comments Lt shoulder ROM WFL and painfree    AROM Assessment Site Cervical;Shoulder    Right/Left Shoulder Right    Right Shoulder Extension 45 Degrees    Right Shoulder Flexion 170 Degrees   with painful arc   Right Shoulder ABduction 170 Degrees   painful arc   Right Shoulder Internal Rotation --   full   Right Shoulder External Rotation --   full   Cervical Flexion 35    Cervical Extension 45    Cervical - Right Side Bend 35    Cervical - Left Side Bend 35    Cervical - Right Rotation 55    Cervical - Left Rotation 50      Strength   Overall Strength Comments bil shoulders and elbows 4/5, pain on Rt IR testing      Palpation   Spinal mobility stiffness in bil facets C5-T3 with signif forward head, some pain with efforts to retract    Palpation comment mild tenderness Rt AC joint, Rt proximal bicep tendon      Special Tests    Special Tests Rotator Cuff Impingement    Rotator Cuff Impingment tests Michel Bickers test;Neer impingement test      Neer Impingement test    Findings Negative    Side Right      Hawkins-Kennedy test   Findings Negative    Side Right                      Objective measurements completed on examination: See above findings.               PT Education - 05/23/20 1014    Education Details Access Code: [email or web address]    Person(s) Educated Patient    Methods Explanation;Demonstration;Handout;Verbal cues    Comprehension Verbalized understanding;Returned demonstration            PT Short Term Goals - 05/23/20 1123      PT SHORT TERM GOAL #1   Title be independent in initial HEP    Time 3    Period Weeks    Status New    Target Date 06/13/20      PT  SHORT TERM GOAL #2   Title Pt will achieve Rt shoulder flexion and abduction A/ROM with min or no painful arc    Baseline full ROM with painful arc    Time 4    Period Weeks    Status New    Target Date 06/20/20      PT SHORT TERM GOAL #3   Title Pt will be able to demo improved posture in sitting and standing without discomfort with neck and scapular retraction    Time 4    Period Weeks    Status New    Target Date 06/27/20             PT Long Term Goals - 05/23/20 1125      PT LONG TERM GOAL #1   Title be independent in advanced HEP    Time 12    Period Weeks    Status New    Target Date 08/15/20      PT LONG TERM GOAL #2   Title Pt will be able to transfer 2lb weight from counter to overhead shelf without pain and with good scapulohumeral rhythm.    Baseline -    Time 12    Period Weeks    Status New    Target Date 08/15/20      PT LONG TERM GOAL #3   Title Pt will report tolerance of light household tasks requiring overhead UE use with min or no pain such as laundry and emptying dishwasher    Baseline -    Time 12    Period Weeks    Status New    Target Date 08/15/20      PT LONG TERM GOAL #4   Title Improve FOTO score from 65% to at least 67% to demo improved function.    Baseline -    Time 12    Period Weeks    Status New    Target Date 08/15/20      PT LONG TERM GOAL #5   Title Improved strength of bil shoulders and elbows to at least 4+/5 for improved strength for shopping and household tasks.    Time 12    Period Weeks    Status New    Target Date 08/15/20                  Plan - 05/23/20 1115    Clinical Impression Statement Pt is a pleasant 80yo female with onset of Rt shoulder pain following a 5 hour drive in Dec 0093.  Pain was an 8/10 and was sharp with reaching to shoulder height or above.  She has had an injection 2 weeks ago which has mostly eliminated pain.  Pt's Rt shoulder A/ROM is now full but with painful arc with flexion  and abduction.  RC impingement tests are negative.  She has some AC joint arthritis which is tender to palpation, and she has some tenderness along proximal bicep tendon.  Rt shoulder and elbow strength are 4/5 with mild pain with IR resisted testing.  She has signif forward head posture, increased thoracic kyphosis, and history of osteoporosis.  Lower cervical spine and upper t-spine are stiff in bil facets which will benefit from mobilization to help Pt achieve improved posture.  FOTO score is 65% with goal of 67%.  Pt wishes to learn how to avoid return of Rt shoulder pain and is an excellent candidate for skilled PT to address deficits, posture, weakness, and functional strength and  mobility.    Personal Factors and Comorbidities Age;Comorbidity 1;Comorbidity 2    Comorbidities breast cancer with masectomy; osteoprosis    Examination-Activity Limitations Reach Overhead;Hygiene/Grooming;Lift;Carry;Dressing    Examination-Participation Restrictions Cleaning;Meal Prep;Laundry;Shop    Stability/Clinical Decision Making Stable/Uncomplicated    Clinical Decision Making Low    Rehab Potential Excellent    PT Frequency 1x / week    PT Duration 12 weeks    PT Treatment/Interventions ADLs/Self Care Home Management;Electrical Stimulation;Cryotherapy;Iontophoresis 4mg /ml Dexamethasone;Moist Heat;Traction;Neuromuscular re-education;Therapeutic exercise;Therapeutic activities;Functional mobility training;Patient/family education;Manual techniques;Spinal Manipulations;Joint Manipulations;Passive range of motion;Dry needling;Taping    PT Next Visit Plan review initial HEP, manual therapy c-spine and t-spine mobs/STM for improved posture, add supine horiz abd and ER, seated scap retraction from supine, row when ready    PT Home Exercise Plan Access Code: [email or web address]    Consulted and Agree with Plan of Care Patient           Patient will benefit from skilled therapeutic intervention in order to improve the  following deficits and impairments:  Decreased strength,Pain,Hypomobility,Postural dysfunction,Impaired UE functional use  Visit Diagnosis: Chronic right shoulder pain - Plan: PT plan of care cert/re-cert  Abnormal posture - Plan: PT plan of care cert/re-cert  Muscle weakness (generalized) - Plan: PT plan of care cert/re-cert     Problem List Patient Active Problem List   Diagnosis Date Noted  . Age-related osteoporosis without current pathological fracture 03/19/2020  . Personal history of colonic polyps 03/19/2020  . Transient cerebral ischemia 09/15/2017  . Pure hypercholesterolemia 09/15/2017  . Pulmonary nodule 09/15/2017  . Hypertension 09/15/2017  . Gastro-esophageal reflux disease without esophagitis 09/15/2017  . VAIN I (vaginal intraepithelial neoplasia grade I) 06/23/2017  . Genetic testing 05/23/2014  . History of breast cancer   . Family history of breast cancer   . Endometrial cancer ([place]) 03/21/2014  . Uterine cancer ([place]) 03/21/2014  . S/P mastectomy, bilateral 05/02/2013  . Situational stress 05/02/2013  . Osteoporosis, unspecified 05/02/2013  . Breast cancer, left breast ([place]) 05/02/2013    Kailah Pennel E [place] 05/23/2020, 11:30 AM  [facility] [address], [place], [place], [address] Phone: [phone]   Fax:  [phone]  Name: Beth Simon MRN: [id number] Date of Birth: [birth year]

## 2020-05-31 ENCOUNTER — Other Ambulatory Visit: Payer: Self-pay

## 2020-05-31 ENCOUNTER — Ambulatory Visit: Payer: Medicare HMO | Attending: Orthopaedic Surgery | Admitting: Physical Therapy

## 2020-05-31 DIAGNOSIS — R278 Other lack of coordination: Secondary | ICD-10-CM | POA: Insufficient documentation

## 2020-05-31 DIAGNOSIS — M25511 Pain in right shoulder: Secondary | ICD-10-CM | POA: Diagnosis not present

## 2020-05-31 DIAGNOSIS — G8929 Other chronic pain: Secondary | ICD-10-CM | POA: Insufficient documentation

## 2020-05-31 DIAGNOSIS — M6281 Muscle weakness (generalized): Secondary | ICD-10-CM | POA: Diagnosis not present

## 2020-05-31 DIAGNOSIS — R293 Abnormal posture: Secondary | ICD-10-CM | POA: Diagnosis not present

## 2020-05-31 NOTE — Therapy (Signed)
[facility] [address] [place], [place], [address] Phone: [phone]   Fax:  [phone]  Physical Therapy Treatment  Patient Details  Name: Beth Simon MRN: [id number] Date of Birth: [birth year] Referring Provider (PT): Hiram Gash, MD   Encounter Date: 05/31/2020   PT End of Session - 05/31/20 1716    Visit Number 2    Date for PT Re-Evaluation 08/15/20    Authorization Type [employer] Medicare    PT Start Time 1532    PT Stop Time 1615    PT Time Calculation (min) 43 min    Activity Tolerance Patient tolerated treatment well           Past Medical History:  Diagnosis Date  . Adenomatous polyps 01/2016  . Anemia   . Breast cancer ([place]) 1990   bilateral mastectomy, adenoca breast-left MRM, reconstruction, chemo  . Bronchitis last 2 weeks   saw dr Felipa Eth 02-28-2014, he said no antibiotics needed, nonproductive cough  . Complication of anesthesia   . Cystitis    cytoxen, had once or twice  . Family history of breast cancer   . History of breast cancer   . History of radiation therapy 2/10, 2/12, 2/18, 2/24, 05/29/14   vaginal cuff/ 30 Gy/ 5 fx  . Hypertension   . Macular degeneration   . Neck pain    taking physictal therapy last 2 weeks  . Osteoporosis    On Prolia.  Marland Kitchen PONV (postoperative nausea and vomiting)   . Shoulder pain    taking physical therapy for last few weeks  . Uterine cancer ([facility]) 03/21/2014   MLH1/PMS2 LOH  . Uterine fibroid   . VAIN I (vaginal intraepithelial neoplasia grade I) 2018   positive HR HPV.  . Vasovagal syncopes     Past Surgical History:  Procedure Laterality Date  . BREAST IMPLANT REMOVAL  6/94   left breast  . BREAST RECONSTRUCTION  1990   left  . CATARACT EXTRACTION Bilateral   . EXCISION VAGINAL CYST    . MASTECTOMY Right 10/08/06   prophylactic  . RADICAL MASTECTOMY LND  1990   left, chemo done  . ROBOTIC ASSISTED TOTAL HYSTERECTOMY WITH BILATERAL  SALPINGO OOPHERECTOMY Bilateral 03/21/2014   Procedure: ROBOTIC ASSISTED TOTAL HYSTERECTOMY WITH BILATERAL SALPINGO OOPHORECTOMY ;  Surgeon: Janie Morning, MD;  Location: [facility];  Service: Gynecology;  Laterality: Bilateral;    There were no vitals filed for this visit.   Subjective Assessment - 05/31/20 1541    Subjective Mild upper shoulder only.  Good carryover with HEP.  I had my Thursday Pilates class today.    Pertinent History Rt shoulder cortisone injection 2 weeks ago with signif relief    Diagnostic tests xray Rt shoulder: good GH joint space, mild/mod AC joint arthritis    Patient Stated Goals continue to have relief and prevent return of pain    Currently in Pain? Yes    Pain Score 1     Pain Location Shoulder    Pain Orientation Right;Upper;Anterior;Lateral    Pain Type Acute pain                             [facility] Adult PT Treatment/Exercise - 05/31/20 0001      Shoulder Exercises: Supine   Protraction AROM;Right;10 reps    Flexion Limitations attempted  bent arm raise then straighten but discontinued after 3 reps secondary to pain  Other Supine Exercises 12:00/6:00 arc 10x    Other Supine Exercises 9:00/3:00 arcs 10x      Shoulder Exercises: Sidelying   External Rotation Right;10 reps      Shoulder Exercises: Standing   Extension Strengthening;Right;15 reps    Theraband Level (Shoulder Extension) Level 2 (Red)    Extension Limitations emphasize scap squeeze    Row Strengthening;Right;15 reps    Theraband Level (Shoulder Row) Level 2 (Red)    Row Limitations elbow stop at rib cage with emphasis on scap squeeze      Manual Therapy   Manual therapy comments grade 1/2 glenohumeral distraction; posterior and inferior mobs; mob with movement 10x                  PT Education - 05/31/20 1707    Education Details supine protaction; red band rows and extensions    Person(s) Educated Patient    Methods Explanation;Demonstration;Handout     Comprehension Returned demonstration;Verbalized understanding            PT Short Term Goals - 05/23/20 1123      PT SHORT TERM GOAL #1   Title be independent in initial HEP    Time 3    Period Weeks    Status New    Target Date 06/13/20      PT SHORT TERM GOAL #2   Title Pt will achieve Rt shoulder flexion and abduction A/ROM with min or no painful arc    Baseline full ROM with painful arc    Time 4    Period Weeks    Status New    Target Date 06/20/20      PT SHORT TERM GOAL #3   Title Pt will be able to demo improved posture in sitting and standing without discomfort with neck and scapular retraction    Time 4    Period Weeks    Status New    Target Date 06/27/20             PT Long Term Goals - 05/23/20 1125      PT LONG TERM GOAL #1   Title be independent in advanced HEP    Time 12    Period Weeks    Status New    Target Date 08/15/20      PT LONG TERM GOAL #2   Title Pt will be able to transfer 2lb weight from counter to overhead shelf without pain and with good scapulohumeral rhythm.    Baseline -    Time 12    Period Weeks    Status New    Target Date 08/15/20      PT LONG TERM GOAL #3   Title Pt will report tolerance of light household tasks requiring overhead UE use with min or no pain such as laundry and emptying dishwasher    Baseline -    Time 12    Period Weeks    Status New    Target Date 08/15/20      PT LONG TERM GOAL #4   Title Improve FOTO score from 65% to at least 67% to demo improved function.    Baseline -    Time 12    Period Weeks    Status New    Target Date 08/15/20      PT LONG TERM GOAL #5   Title Improved strength of bil shoulders and elbows to at least 4+/5 for improved strength for shopping and household tasks.  Time 12    Period Weeks    Status New    Target Date 08/15/20                 Plan - 05/31/20 1538    Clinical Impression Statement The patient reports mild anterior/lateral shoulder pain  at rest and with activity.  She is able to progress with shoulder and scapular strengthening to include supine protraction and band rows and extensions.  She has difficulty stabilizing with small arc clocks 12/6:00 and 9/3:00.  Modified exercises based on pain response.  Verbal cues to avoid hyperextension of the shoulder with band rows.    Personal Factors and Comorbidities Age;Comorbidity 1;Comorbidity 2    Comorbidities breast cancer with masectomy; osteoprosis    Examination-Activity Limitations Reach Overhead;Hygiene/Grooming;Lift;Carry;Dressing    Stability/Clinical Decision Making Stable/Uncomplicated    Rehab Potential Excellent    PT Frequency 1x / week    PT Duration 12 weeks    PT Treatment/Interventions ADLs/Self Care Home Management;Electrical Stimulation;Cryotherapy;Iontophoresis 4mg/ml Dexamethasone;Moist Heat;Traction;Neuromuscular re-education;Therapeutic exercise;Therapeutic activities;Functional mobility training;Patient/family education;Manual techniques;Spinal Manipulations;Joint Manipulations;Passive range of motion;Dry needling;Taping    PT Next Visit Plan manual therapy c-spine and t-spine mobs/STM for improved posture, add supine horiz abd and ER, seated scap retraction from supine, band rows and extensions    PT Home Exercise Plan Access Code: [email or web address]           Patient will benefit from skilled therapeutic intervention in order to improve the following deficits and impairments:  Decreased strength,Pain,Hypomobility,Postural dysfunction,Impaired UE functional use  Visit Diagnosis: Chronic right shoulder pain  Abnormal posture  Muscle weakness (generalized)     Problem List Patient Active Problem List   Diagnosis Date Noted  . Age-related osteoporosis without current pathological fracture 03/19/2020  . Personal history of colonic polyps 03/19/2020  . Transient cerebral ischemia 09/15/2017  . Pure hypercholesterolemia 09/15/2017  . Pulmonary nodule  09/15/2017  . Hypertension 09/15/2017  . Gastro-esophageal reflux disease without esophagitis 09/15/2017  . VAIN I (vaginal intraepithelial neoplasia grade I) 06/23/2017  . Genetic testing 05/23/2014  . History of breast cancer   . Family history of breast cancer   . Endometrial cancer ([facility]) 03/21/2014  . Uterine cancer ([facility]) 03/21/2014  . S/P mastectomy, bilateral 05/02/2013  . Situational stress 05/02/2013  . Osteoporosis, unspecified 05/02/2013  . Breast cancer, left breast ([facility]) 05/02/2013   Stacy Simpson, PT 05/31/20 5:27 PM Phone: [phone] Fax: [phone] Simpson, Stacy C 05/31/2020, 5:27 PM  [facility] [address] [place], [place], [address] Phone: [phone]   Fax:  [phone]  Name: Beth Simon MRN: [id number] Date of Birth: 11/08/1940   

## 2020-05-31 NOTE — Patient Instructions (Signed)
Access Code: [email or web address] URL: [email or web address] Date: 05/31/2020 Prepared by: Ruben Im  Exercises Doorway Pec Stretch at 90 Degrees Abduction - 1 x daily - 7 x weekly - 1 sets - 3 reps - 20 hold Supine Shoulder Flexion Extension AAROM with Dowel - 1 x daily - 7 x weekly - 1 sets - 10 reps Supine Cervical Retraction with Towel - 1 x daily - 7 x weekly - 1 sets - 10 reps - 5 hold Seated Scapular Retraction - 1 x daily - 7 x weekly - 1 sets - 10 reps - 5 hold Supine Scapular Protraction in Flexion with Dumbbells - 1 x daily - 7 x weekly - 2 sets - 10 reps Standing Row with Anchored Resistance - 1 x daily - 7 x weekly - 2 sets - 10 reps Single Arm Shoulder Extension with Anchored Resistance - 1 x daily - 7 x weekly - 2 sets - 10 reps

## 2020-06-04 ENCOUNTER — Other Ambulatory Visit: Payer: Self-pay

## 2020-06-04 ENCOUNTER — Ambulatory Visit: Payer: Medicare HMO | Admitting: Physical Therapy

## 2020-06-04 ENCOUNTER — Encounter: Payer: Self-pay | Admitting: Physical Therapy

## 2020-06-04 DIAGNOSIS — G8929 Other chronic pain: Secondary | ICD-10-CM

## 2020-06-04 DIAGNOSIS — M6281 Muscle weakness (generalized): Secondary | ICD-10-CM

## 2020-06-04 DIAGNOSIS — R293 Abnormal posture: Secondary | ICD-10-CM

## 2020-06-04 DIAGNOSIS — M25511 Pain in right shoulder: Secondary | ICD-10-CM | POA: Diagnosis not present

## 2020-06-04 DIAGNOSIS — R278 Other lack of coordination: Secondary | ICD-10-CM | POA: Diagnosis not present

## 2020-06-04 NOTE — Therapy (Signed)
[facility] [address] [place], [place], [address] Phone: [phone]   Fax:  [phone]  Physical Therapy Treatment  Patient Details  Name: Beth Simon MRN: [id number] Date of Birth: 08/12/1940 Referring Provider (PT): Varkey, Dax T, MD   Encounter Date: 06/04/2020   PT End of Session - 06/04/20 1234    Visit Number 3    Date for PT Re-Evaluation 08/15/20    Authorization Type A[employer]na Medicare    PT Start Time 1145    PT Stop Time 1230    PT Time Calculation (min) 45 min    Activity Tolerance Patient tolerated treatment well    Behavior During Therapy WFL for tasks assessed/performed           Past Medical History:  Diagnosis Date  . Adenomatous polyps 01/2016  . Anemia   . Breast cancer ([facility]) 1990   bilateral mastectomy, adenoca breast-left MRM, reconstruction, chemo  . Bronchitis last 2 weeks   saw dr stoneking 02-28-2014, he said no antibiotics needed, nonproductive cough  . Complication of anesthesia   . Cystitis    cytoxen, had once or twice  . Family history of breast cancer   . History of breast cancer   . History of radiation therapy 2/10, 2/12, 2/18, 2/24, 05/29/14   vaginal cuff/ 30 Gy/ 5 fx  . Hypertension   . Macular degeneration   . Neck pain    taking physictal therapy last 2 weeks  . Osteoporosis    On Prolia.  . PONV (postoperative nausea and vomiting)   . Shoulder pain    taking physical therapy for last few weeks  . Uterine cancer ([facility]) 03/21/2014   MLH1/PMS2 LOH  . Uterine fibroid   . VAIN I (vaginal intraepithelial neoplasia grade I) 2018   positive HR HPV.  . Vasovagal syncopes     Past Surgical History:  Procedure Laterality Date  . BREAST IMPLANT REMOVAL  6/94   left breast  . BREAST RECONSTRUCTION  1990   left  . CATARACT EXTRACTION Bilateral   . EXCISION VAGINAL CYST    . MASTECTOMY Right 10/08/06   prophylactic  . RADICAL MASTECTOMY LND  1990   left, chemo  done  . ROBOTIC ASSISTED TOTAL HYSTERECTOMY WITH BILATERAL SALPINGO OOPHERECTOMY Bilateral 03/21/2014   Procedure: ROBOTIC ASSISTED TOTAL HYSTERECTOMY WITH BILATERAL SALPINGO OOPHORECTOMY ;  Surgeon: Wendy Brewster, MD;  Location: [facility];  Service: Gynecology;  Laterality: Bilateral;    There were no vitals filed for this visit.   Subjective Assessment - 06/04/20 1233    Subjective I have just an occassional twinge that is sharp but doesn't last in the shoulder.  HEP is going just fine.    Pertinent History Rt shoulder cortisone injection 2 weeks ago with signif relief    Diagnostic tests xray Rt shoulder: good GH joint space, mild/mod AC joint arthritis    Patient Stated Goals continue to have relief and prevent return of pain    Currently in Pain? No/denies    Pain Score --   brief sharp twinges that don't last   Pain Location Shoulder    Pain Orientation Right;Posterior;Proximal;Upper    Pain Descriptors / Indicators Sharp    Pain Type Acute pain    Pain Onset More than a month ago    Pain Frequency Occasional                               [facility] Adult PT Treatment/Exercise - 06/04/20 0001      Neuro Re-ed    Neuro Re-ed Details  neck retraction into manual resistance by PT in slight neck flexion      Exercises   Exercises Shoulder;Neck      Shoulder Exercises: Supine   Protraction AROM;Right;10 reps    Horizontal ABduction Strengthening;Both;10 reps;Theraband    Theraband Level (Shoulder Horizontal ABduction) Level 1 (Yellow)    External Rotation Strengthening;Both;10 reps;Theraband    Theraband Level (Shoulder External Rotation) Level 1 (Yellow)    Flexion AAROM;10 reps    Flexion Limitations chest press holding yellow tband and overhead flexion bil    Other Supine Exercises 12:00/6:00 arc 10x    Other Supine Exercises 9:00/3:00 arcs 10x      Manual Therapy   Manual Therapy Soft tissue mobilization;Passive ROM;Joint mobilization    Joint Mobilization Gr  I/II/III posterior and inf glides GH joints, T1-T3 PAs from supine with neck retraction 5x5"    Soft tissue mobilization Rt upper trap, levator, pectorals, bicep tendon    Passive ROM gentle small range cervical flexion and rotation with some contract/relax, gentle                    PT Short Term Goals - 05/23/20 1123      PT SHORT TERM GOAL #1   Title be independent in initial HEP    Time 3    Period Weeks    Status New    Target Date 06/13/20      PT SHORT TERM GOAL #2   Title Pt will achieve Rt shoulder flexion and abduction A/ROM with min or no painful arc    Baseline full ROM with painful arc    Time 4    Period Weeks    Status New    Target Date 06/20/20      PT SHORT TERM GOAL #3   Title Pt will be able to demo improved posture in sitting and standing without discomfort with neck and scapular retraction    Time 4    Period Weeks    Status New    Target Date 06/27/20             PT Long Term Goals - 05/23/20 1125      PT LONG TERM GOAL #1   Title be independent in advanced HEP    Time 12    Period Weeks    Status New    Target Date 08/15/20      PT LONG TERM GOAL #2   Title Pt will be able to transfer 2lb weight from counter to overhead shelf without pain and with good scapulohumeral rhythm.    Baseline -    Time 12    Period Weeks    Status New    Target Date 08/15/20      PT LONG TERM GOAL #3   Title Pt will report tolerance of light household tasks requiring overhead UE use with min or no pain such as laundry and emptying dishwasher    Baseline -    Time 12    Period Weeks    Status New    Target Date 08/15/20      PT LONG TERM GOAL #4   Title Improve FOTO score from 65% to at least 67% to demo improved function.    Baseline -    Time 12    Period Weeks    Status New      Target Date 08/15/20      PT LONG TERM GOAL #5   Title Improved strength of bil shoulders and elbows to at least 4+/5 for improved strength for shopping and  household tasks.    Time 12    Period Weeks    Status New    Target Date 08/15/20                 Plan - 06/04/20 1238    Clinical Impression Statement Pt continues to have occassional sharp twinges of pain in Rt posterior shoulder that don't last.  She is unsure what move causes them.  She is compliant with HEP and was able to demo improved proximal stabilization today with small arcs of motion.  AA/ROM for overhead flexion was painfree in supine today after manual therapy, although she felt some pain standing with finger ladder trial end of session.  PT advanced scapular and posterior shoulder ther ex for HEP today given it was well tolerated in supine today.  Continue along POC with ongoing postural and shoulder strengthening as tol.    Comorbidities breast cancer with masectomy; osteoprosis    PT Frequency 1x / week    PT Duration 12 weeks    PT Treatment/Interventions ADLs/Self Care Home Management;Electrical Stimulation;Cryotherapy;Iontophoresis 4mg/ml Dexamethasone;Moist Heat;Traction;Neuromuscular re-education;Therapeutic exercise;Therapeutic activities;Functional mobility training;Patient/family education;Manual techniques;Spinal Manipulations;Joint Manipulations;Passive range of motion;Dry needling;Taping    PT Next Visit Plan review bands, try UBE, ionto if needed to Rt RC tendons, seated and standing postural review    PT Home Exercise Plan Access Code: [email or web address]    Consulted and Agree with Plan of Care Patient           Patient will benefit from skilled therapeutic intervention in order to improve the following deficits and impairments:     Visit Diagnosis: Chronic right shoulder pain  Abnormal posture  Muscle weakness (generalized)     Problem List Patient Active Problem List   Diagnosis Date Noted  . Age-related osteoporosis without current pathological fracture 03/19/2020  . Personal history of colonic polyps 03/19/2020  . Transient cerebral ischemia  09/15/2017  . Pure hypercholesterolemia 09/15/2017  . Pulmonary nodule 09/15/2017  . Hypertension 09/15/2017  . Gastro-esophageal reflux disease without esophagitis 09/15/2017  . VAIN I (vaginal intraepithelial neoplasia grade I) 06/23/2017  . Genetic testing 05/23/2014  . History of breast cancer   . Family history of breast cancer   . Endometrial cancer ([facility]) 03/21/2014  . Uterine cancer ([facility]) 03/21/2014  . S/P mastectomy, bilateral 05/02/2013  . Situational stress 05/02/2013  . Osteoporosis, unspecified 05/02/2013  . Breast cancer, left breast ([facility]) 05/02/2013    Beth Simon 06/04/2020, 12:41 PM  [facility] [address], [address] [place], [place], [address] Phone: [phone]   Fax:  [phone]  Name: Beth Simon MRN: [id number] Date of Birth: 03/11/1941   

## 2020-06-10 ENCOUNTER — Telehealth: Payer: Self-pay | Admitting: Obstetrics and Gynecology

## 2020-06-10 NOTE — Telephone Encounter (Signed)
Please check with patient to see if she received her bone density results from [facility] ordered through Dr. Layne Benton.   She has osteopenia of the hips.  Her spine is normal.   Since her last bone density test, she had an increased in bone density of her spine and left hip.  Her right hip is stable.   Dr. Layne Benton will determine if she continue on her Prolia.

## 2020-06-11 ENCOUNTER — Ambulatory Visit: Payer: Medicare HMO | Admitting: Physical Therapy

## 2020-06-11 ENCOUNTER — Encounter: Payer: Self-pay | Admitting: Physical Therapy

## 2020-06-11 ENCOUNTER — Other Ambulatory Visit: Payer: Self-pay

## 2020-06-11 DIAGNOSIS — R278 Other lack of coordination: Secondary | ICD-10-CM

## 2020-06-11 DIAGNOSIS — M6281 Muscle weakness (generalized): Secondary | ICD-10-CM | POA: Diagnosis not present

## 2020-06-11 DIAGNOSIS — M25511 Pain in right shoulder: Secondary | ICD-10-CM | POA: Diagnosis not present

## 2020-06-11 DIAGNOSIS — R293 Abnormal posture: Secondary | ICD-10-CM | POA: Diagnosis not present

## 2020-06-11 DIAGNOSIS — G8929 Other chronic pain: Secondary | ICD-10-CM

## 2020-06-11 NOTE — Therapy (Signed)
[facility] [address] [place], [place], [address] Phone: [phone]   Fax:  [phone]  Physical Therapy Treatment  Patient Details  Name: GENIEVE RAMASWAMY MRN: [id number] Date of Birth: 17-Mar-1941 Referring Provider (PT): Hiram Gash, MD   Encounter Date: 06/11/2020   PT End of Session - 06/11/20 1405    Visit Number 4    Date for PT Re-Evaluation 08/15/20    Authorization Type [employer]na Medicare    PT Start Time 1403    PT Stop Time 1445    PT Time Calculation (min) 42 min    Activity Tolerance --    Behavior During Therapy [facility] for tasks assessed/performed           Past Medical History:  Diagnosis Date  . Adenomatous polyps 01/2016  . Anemia   . Breast cancer ([place]) 1990   bilateral mastectomy, adenoca breast-left MRM, reconstruction, chemo  . Bronchitis last 2 weeks   saw dr Felipa Eth 02-28-2014, he said no antibiotics needed, nonproductive cough  . Complication of anesthesia   . Cystitis    cytoxen, had once or twice  . Family history of breast cancer   . History of breast cancer   . History of radiation therapy 2/10, 2/12, 2/18, 2/24, 05/29/14   vaginal cuff/ 30 Gy/ 5 fx  . Hypertension   . Macular degeneration   . Neck pain    taking physictal therapy last 2 weeks  . Osteoporosis    On Prolia.  Marland Kitchen PONV (postoperative nausea and vomiting)   . Shoulder pain    taking physical therapy for last few weeks  . Uterine cancer ([place]) 03/21/2014   MLH1/PMS2 LOH  . Uterine fibroid   . VAIN I (vaginal intraepithelial neoplasia grade I) 2018   positive HR HPV.  . Vasovagal syncopes     Past Surgical History:  Procedure Laterality Date  . BREAST IMPLANT REMOVAL  6/94   left breast  . BREAST RECONSTRUCTION  1990   left  . CATARACT EXTRACTION Bilateral   . EXCISION VAGINAL CYST    . MASTECTOMY Right 10/08/06   prophylactic  . RADICAL MASTECTOMY LND  1990   left, chemo done  . ROBOTIC ASSISTED  TOTAL HYSTERECTOMY WITH BILATERAL SALPINGO OOPHERECTOMY Bilateral 03/21/2014   Procedure: ROBOTIC ASSISTED TOTAL HYSTERECTOMY WITH BILATERAL SALPINGO OOPHORECTOMY ;  Surgeon: Janie Morning, MD;  Location: [facility];  Service: Gynecology;  Laterality: Bilateral;    There were no vitals filed for this visit.   Subjective Assessment - 06/11/20 1406    Subjective Pt reports therapy has helped an additional 20% since the cortisone.    Pertinent History Rt shoulder cortisone injection 2 weeks ago with signif relief    Currently in Pain? No/denies    Multiple Pain Sites No                             [facility] Adult PT Treatment/Exercise - 06/11/20 0001      Shoulder Exercises: Supine   Other Supine Exercises Supine on blue soft roll for decompression x 3 min      Shoulder Exercises: Seated   Diagonals Limitations green loop 5 sec ER hold 10x, then added forward flexion of the arms with some tension in band 10x, then lifting to 90 degrees 10x    Other Seated Exercises 1# forward flexion & abd to 90 degrees Bil 10x alternating    Other  Seated Exercises green band [place] 4 10x each      Shoulder Exercises: Standing   Diagonals Limitations Soft ball Myofascial movement on the wall for interscapula and pec major 2 min each spot    Other Standing Exercises wall push ups 10x    Other Standing Exercises 2# to first shelf 2x10      Shoulder Exercises: Pulleys   Flexion 3 minutes   review of status                   PT Short Term Goals - 06/11/20 1409      PT SHORT TERM GOAL #1   Title be independent in initial HEP    Time 3    Period Weeks    Status Achieved    Target Date 06/13/20      PT SHORT TERM GOAL #3   Title Pt will be able to demo improved posture in sitting and standing without discomfort with neck and scapular retraction    Time 4    Period Weeks    Status Achieved             PT Long Term Goals - 06/11/20 1434      PT LONG TERM GOAL #2    Title Pt will be able to transfer 2lb weight from counter to overhead shelf without pain and with good scapulohumeral rhythm.    Time 12    Period Weeks    Status Achieved                 Plan - 06/11/20 1421    Clinical Impression Statement Pt reports PT has helped an additional 20% since the cortisone shot. Pt tolerated all theraputic exercises with more resisatnce for strength. Pt was able to put 2# into the shelf, first shelf, 2x10 without any pain or substitutions. met goal. Pt liked performing release work to her pectorals with the soft ball on the wall.    Personal Factors and Comorbidities Age;Comorbidity 1;Comorbidity 2    Comorbidities breast cancer with masectomy; osteoprosis    Examination-Activity Limitations Reach Overhead;Hygiene/Grooming;Lift;Carry;Dressing    Examination-Participation Restrictions Cleaning;Meal Prep;Laundry;Shop    Stability/Clinical Decision Making Stable/Uncomplicated    Rehab Potential Excellent    PT Frequency 1x / week    PT Duration 12 weeks    PT Treatment/Interventions ADLs/Self Care Home Management;Electrical Stimulation;Cryotherapy;Iontophoresis 4mg /ml Dexamethasone;Moist Heat;Traction;Neuromuscular re-education;Therapeutic exercise;Therapeutic activities;Functional mobility training;Patient/family education;Manual techniques;Spinal Manipulations;Joint Manipulations;Passive range of motion;Dry needling;Taping    PT Next Visit Plan pt has soft foam roll at home that she would like to use for some exercises. She will try to lay on it 2 min in the vertical position.    PT Home Exercise Plan Access Code: [email or web address]    DDUKGURKY and Agree with Plan of Care Patient           Patient will benefit from skilled therapeutic intervention in order to improve the following deficits and impairments:  Decreased strength,Pain,Hypomobility,Postural dysfunction,Impaired UE functional use  Visit Diagnosis: Chronic right shoulder pain  Abnormal  posture  Muscle weakness (generalized)  Other lack of coordination     Problem List Patient Active Problem List   Diagnosis Date Noted  . Age-related osteoporosis without current pathological fracture 03/19/2020  . Personal history of colonic polyps 03/19/2020  . Transient cerebral ischemia 09/15/2017  . Pure hypercholesterolemia 09/15/2017  . Pulmonary nodule 09/15/2017  . Hypertension 09/15/2017  . Gastro-esophageal reflux disease without esophagitis 09/15/2017  . VAIN I (vaginal intraepithelial neoplasia  grade I) 06/23/2017  . Genetic testing 05/23/2014  . History of breast cancer   . Family history of breast cancer   . Endometrial cancer ([place]) 03/21/2014  . Uterine cancer () 03/21/2014  . S/P mastectomy, bilateral 05/02/2013  . Situational stress 05/02/2013  . Osteoporosis, unspecified 05/02/2013  . Breast cancer, left breast ([place]) 05/02/2013    Lanesha Azzaro, PTA 06/11/2020, 2:46 PM  [facility] [address] [place], [place], [address] Phone: [phone]   Fax:  [phone]  Name: SARAHY CREEDON MRN: [id number] Date of Birth: [birth year]

## 2020-06-11 NOTE — Telephone Encounter (Signed)
Patient informed. 

## 2020-06-18 DIAGNOSIS — K219 Gastro-esophageal reflux disease without esophagitis: Secondary | ICD-10-CM | POA: Diagnosis not present

## 2020-06-18 DIAGNOSIS — E785 Hyperlipidemia, unspecified: Secondary | ICD-10-CM | POA: Diagnosis not present

## 2020-06-18 DIAGNOSIS — I10 Essential (primary) hypertension: Secondary | ICD-10-CM | POA: Diagnosis not present

## 2020-06-19 ENCOUNTER — Encounter: Payer: Self-pay | Admitting: Physical Therapy

## 2020-06-19 ENCOUNTER — Ambulatory Visit: Payer: Medicare HMO | Admitting: Physical Therapy

## 2020-06-19 ENCOUNTER — Other Ambulatory Visit: Payer: Self-pay

## 2020-06-19 DIAGNOSIS — G8929 Other chronic pain: Secondary | ICD-10-CM | POA: Diagnosis not present

## 2020-06-19 DIAGNOSIS — M6281 Muscle weakness (generalized): Secondary | ICD-10-CM

## 2020-06-19 DIAGNOSIS — R293 Abnormal posture: Secondary | ICD-10-CM | POA: Diagnosis not present

## 2020-06-19 DIAGNOSIS — M25511 Pain in right shoulder: Secondary | ICD-10-CM | POA: Diagnosis not present

## 2020-06-19 DIAGNOSIS — R278 Other lack of coordination: Secondary | ICD-10-CM | POA: Diagnosis not present

## 2020-06-20 NOTE — Therapy (Signed)
[facility] [address], [place], [place], [address] Phone: [phone]   Fax:  [phone]  Physical Therapy Treatment  Patient Details  Name: Beth Simon MRN: [id number] Date of Birth: 01/28/1941 Referring Provider (PT): Hiram Gash, MD   Encounter Date: 06/19/2020   PT End of Session - 06/19/20 1615    Visit Number 5    Date for PT Re-Evaluation 08/15/20    Authorization Type Aetna Medicare    PT Start Time 1540   Pt late   PT Stop Time 1615    PT Time Calculation (min) 35 min    Activity Tolerance Patient tolerated treatment well    Behavior During Therapy [facility] for tasks assessed/performed           Past Medical History:  Diagnosis Date  . Adenomatous polyps 01/2016  . Anemia   . Breast cancer ([place]) 1990   bilateral mastectomy, adenoca breast-left MRM, reconstruction, chemo  . Bronchitis last 2 weeks   saw dr Felipa Eth 02-28-2014, he said no antibiotics needed, nonproductive cough  . Complication of anesthesia   . Cystitis    cytoxen, had once or twice  . Family history of breast cancer   . History of breast cancer   . History of radiation therapy 2/10, 2/12, 2/18, 2/24, 05/29/14   vaginal cuff/ 30 Gy/ 5 fx  . Hypertension   . Macular degeneration   . Neck pain    taking physictal therapy last 2 weeks  . Osteoporosis    On Prolia.  Marland Kitchen PONV (postoperative nausea and vomiting)   . Shoulder pain    taking physical therapy for last few weeks  . Uterine cancer ([place]) 03/21/2014   MLH1/PMS2 LOH  . Uterine fibroid   . VAIN I (vaginal intraepithelial neoplasia grade I) 2018   positive HR HPV.  . Vasovagal syncopes     Past Surgical History:  Procedure Laterality Date  . BREAST IMPLANT REMOVAL  6/94   left breast  . BREAST RECONSTRUCTION  1990   left  . CATARACT EXTRACTION Bilateral   . EXCISION VAGINAL CYST    . MASTECTOMY Right 10/08/06   prophylactic  . RADICAL MASTECTOMY LND  1990    left, chemo done  . ROBOTIC ASSISTED TOTAL HYSTERECTOMY WITH BILATERAL SALPINGO OOPHERECTOMY Bilateral 03/21/2014   Procedure: ROBOTIC ASSISTED TOTAL HYSTERECTOMY WITH BILATERAL SALPINGO OOPHORECTOMY ;  Surgeon: Janie Morning, MD;  Location: [facility];  Service: Gynecology;  Laterality: Bilateral;    There were no vitals filed for this visit.   Subjective Assessment - 06/19/20 1542    Subjective Every now and then I still get that catch in Rt shoulder.    Pertinent History Rt shoulder cortisone injection 2 weeks ago with signif relief    Diagnostic tests xray Rt shoulder: good GH joint space, mild/mod AC joint arthritis    Patient Stated Goals continue to have relief and prevent return of pain    Currently in Pain? No/denies                             [facility] Adult PT Treatment/Exercise - 06/19/20 0001      Neck Exercises: Machines for Strengthening   UBE (Upper Arm Bike) L1 1x1 PT present to monitor      Shoulder Exercises: Supine   Horizontal ABduction Strengthening;15 reps;Theraband    Theraband Level (Shoulder Horizontal ABduction) Level 1 (Yellow)    Horizontal  ABduction Limitations on blue foam roller    Other Supine Exercises Supine on blue soft roll for decompression x 3 min   from Lt sidelying to as not to strain Rt shoulder     Shoulder Exercises: Seated   Abduction Strengthening;Both;10 reps;Weights    ABduction Weight (lbs) 1    ABduction Limitations elbows bent due to pain with long arm    Diagonals Limitations green loop 5 sec ER hold 10x, then added forward flexion of the arms with some tension in band 10x, then lifting to 90 degrees 10x    Other Seated Exercises 1# forward flexion & scaption to 90 degrees Bil 10x alternating      Shoulder Exercises: Standing   External Rotation Strengthening;Both;10 reps;Theraband    Theraband Level (Shoulder External Rotation) Level 3 (Green)    Internal Rotation Strengthening;Right;Both;10 reps;Theraband     Theraband Level (Shoulder Internal Rotation) Level 3 (Green)    Flexion AAROM;Right;10 reps    Flexion Limitations wall slides    Extension Strengthening;Both;10 reps;Theraband    Theraband Level (Shoulder Extension) Level 3 (Green)    Row Strengthening;Both;10 reps;Theraband    Theraband Level (Shoulder Row) Level 3 (Green)    Diagonals Limitations Soft ball Myofascial movement on the wall for interscapula and pec major 2 min each spot    Other Standing Exercises wall push ups 10x      Shoulder Exercises: Stretch   Other Shoulder Stretches Rt pec stretch doorway with Lt trunk rotation 1x30 sec                    PT Short Term Goals - 06/11/20 1409      PT SHORT TERM GOAL #1   Title be independent in initial HEP    Time 3    Period Weeks    Status Achieved    Target Date 06/13/20      PT SHORT TERM GOAL #3   Title Pt will be able to demo improved posture in sitting and standing without discomfort with neck and scapular retraction    Time 4    Period Weeks    Status Achieved             PT Long Term Goals - 06/11/20 1434      PT LONG TERM GOAL #2   Title Pt will be able to transfer 2lb weight from counter to overhead shelf without pain and with good scapulohumeral rhythm.    Time 12    Period Weeks    Status Achieved                 Plan - 06/19/20 1616    Clinical Impression Statement Pt continues to report approx 20% improvement since starting PT.  She has intermittent pain/twinges in Rt shoulder with use.  She is working on postural awareness for correction of stiff forward head and rounded shoulders.  She has been able to progress her ther ex resistance and variety of muscle groups with good tolerance.  She had muscle soreness/fatigue end of session but no Rt shoulder pain.  She also reported some mid-back discomfort after getting up off soft foam roller today but was unsure whether it was muscle soreness from ther ex or being on foam roller.  PT cued  mounting foam roller from Lt SL as she felt she strained Rt shoulder last visit getting on via pushing off Rt UE.  Continue along POC with ongoing assessment of response to treatment.  Comorbidities breast cancer with masectomy; osteoprosis    PT Frequency 1x / week    PT Duration 12 weeks    PT Treatment/Interventions ADLs/Self Care Home Management;Electrical Stimulation;Cryotherapy;Iontophoresis 4mg /ml Dexamethasone;Moist Heat;Traction;Neuromuscular re-education;Therapeutic exercise;Therapeutic activities;Functional mobility training;Patient/family education;Manual techniques;Spinal Manipulations;Joint Manipulations;Passive range of motion;Dry needling;Taping    PT Next Visit Plan continue postural reed with manual therapy as needed for cervical posture retraction ease, progress shoulder and scapular stab/strength    PT Home Exercise Plan Access Code: [email or web address]           Patient will benefit from skilled therapeutic intervention in order to improve the following deficits and impairments:     Visit Diagnosis: Chronic right shoulder pain  Abnormal posture  Muscle weakness (generalized)     Problem List Patient Active Problem List   Diagnosis Date Noted  . Age-related osteoporosis without current pathological fracture 03/19/2020  . Personal history of colonic polyps 03/19/2020  . Transient cerebral ischemia 09/15/2017  . Pure hypercholesterolemia 09/15/2017  . Pulmonary nodule 09/15/2017  . Hypertension 09/15/2017  . Gastro-esophageal reflux disease without esophagitis 09/15/2017  . VAIN I (vaginal intraepithelial neoplasia grade I) 06/23/2017  . Genetic testing 05/23/2014  . History of breast cancer   . Family history of breast cancer   . Endometrial cancer ([place]) 03/21/2014  . Uterine cancer ([place]) 03/21/2014  . S/P mastectomy, bilateral 05/02/2013  . Situational stress 05/02/2013  . Osteoporosis, unspecified 05/02/2013  . Breast cancer, left breast ([place]) 05/02/2013    Kerriann Kamphuis, PT 06/20/20 5:50 AM   [facility] [address], [place], [place], [address] Phone: [phone]   Fax:  [phone]  Name: Beth Simon MRN: [id number] Date of Birth: January 01, 1941

## 2020-06-26 ENCOUNTER — Ambulatory Visit: Payer: Medicare HMO | Admitting: Podiatry

## 2020-06-26 ENCOUNTER — Encounter: Payer: Self-pay | Admitting: Podiatry

## 2020-06-26 ENCOUNTER — Encounter: Payer: Medicare HMO | Admitting: Physical Therapy

## 2020-06-26 ENCOUNTER — Other Ambulatory Visit: Payer: Self-pay

## 2020-06-26 DIAGNOSIS — Q828 Other specified congenital malformations of skin: Secondary | ICD-10-CM | POA: Diagnosis not present

## 2020-06-26 DIAGNOSIS — M79676 Pain in unspecified toe(s): Secondary | ICD-10-CM

## 2020-06-26 DIAGNOSIS — B351 Tinea unguium: Secondary | ICD-10-CM

## 2020-06-26 DIAGNOSIS — I83893 Varicose veins of bilateral lower extremities with other complications: Secondary | ICD-10-CM | POA: Diagnosis not present

## 2020-06-26 DIAGNOSIS — R252 Cramp and spasm: Secondary | ICD-10-CM

## 2020-06-26 DIAGNOSIS — R600 Localized edema: Secondary | ICD-10-CM | POA: Diagnosis not present

## 2020-06-26 NOTE — Patient Instructions (Signed)
You can try theraworx for the cramping   Leg Cramps Leg cramps occur when one or more muscles tighten and a person has no control over it (involuntary muscle contraction). Muscle cramps are most common in the calf muscles of the leg. They can occur during exercise or at rest. Leg cramps are painful, and they may last for a few seconds to a few minutes. Cramps may return several times before they finally stop. Usually, leg cramps are not caused by a serious medical problem. In many cases, the cause is not known. Some common causes include:  Excessive physical effort (overexertion), such as during intense exercise.  Doing the same motion over and over.  Staying in a certain position for a long period of time.  Improper preparation, form, or technique while doing a sport or an activity.  Dehydration.  Injury.  Side effects of certain medicines.  Abnormally low levels of minerals in your blood (electrolytes), especially potassium and calcium. This could result from: ? Pregnancy. ? Taking diuretic medicines. Follow these instructions at home: Eating and drinking  Drink enough fluid to keep your urine pale yellow. Staying hydrated may help prevent cramps.  Eat a healthy diet that includes plenty of nutrients to help your muscles function. A healthy diet includes fruits and vegetables, lean protein, whole grains, and low-fat or nonfat dairy products. Managing pain, stiffness, and swelling  Try massaging, stretching, and relaxing the affected muscle. Do this for several minutes at a time.  If directed, put ice on areas that are sore or painful after a cramp. To do this: ? Put ice in a plastic bag. ? Place a towel between your skin and the bag. ? Leave the ice on for 20 minutes, 2-3 times a day. ? Remove the ice if your skin turns bright red. This is very important. If you cannot feel pain, heat, or cold, you have a greater risk of damage to the area.  If directed, apply heat to  muscles that are tense or tight. Do this before you exercise, or as often as told by your health care provider. Use the heat source that your health care provider recommends, such as a moist heat pack or a heating pad. To do this: ? Place a towel between your skin and the heat source. ? Leave the heat on for 20-30 minutes. ? Remove the heat if your skin turns bright red. This is especially important if you are unable to feel pain, heat, or cold. You may have a greater risk of getting burned.  Try taking hot showers or baths to help relax tight muscles.      General instructions  If you are having frequent leg cramps, avoid intense exercise for several days.  Take over-the-counter and prescription medicines only as told by your health care provider.  Keep all follow-up visits. This is important. Contact a health care provider if:  Your leg cramps get more severe or more frequent, or they do not improve over time.  Your foot becomes cold, numb, or blue. Summary  Muscle cramps can develop in any muscle, but the most common place is in the calf muscles of the leg.  Leg cramps are painful, and they may last for a few seconds to a few minutes.  Usually, leg cramps are not caused by a serious medical problem. Often, the cause is not known.  Stay hydrated, and take over-the-counter and prescription medicines only as told by your health care provider. This information is not  intended to replace advice given to you by your health care provider. Make sure you discuss any questions you have with your health care provider. Document Revised: 08/03/2019 Document Reviewed: 08/03/2019 Elsevier Patient Education  [place].

## 2020-06-27 ENCOUNTER — Ambulatory Visit: Payer: Medicare HMO | Admitting: Physical Therapy

## 2020-06-27 ENCOUNTER — Encounter: Payer: Self-pay | Admitting: Physical Therapy

## 2020-06-27 DIAGNOSIS — M6281 Muscle weakness (generalized): Secondary | ICD-10-CM | POA: Diagnosis not present

## 2020-06-27 DIAGNOSIS — R293 Abnormal posture: Secondary | ICD-10-CM

## 2020-06-27 DIAGNOSIS — M25511 Pain in right shoulder: Secondary | ICD-10-CM | POA: Diagnosis not present

## 2020-06-27 DIAGNOSIS — R278 Other lack of coordination: Secondary | ICD-10-CM | POA: Diagnosis not present

## 2020-06-27 DIAGNOSIS — G8929 Other chronic pain: Secondary | ICD-10-CM

## 2020-06-27 NOTE — Therapy (Signed)
[facility] [address] [place], [place], [address] Phone: [phone]   Fax:  [phone]  Physical Therapy Treatment  Patient Details  Name: Beth Simon MRN: [id number] Date of Birth: 1941/02/08 Referring Provider (PT): Hiram Gash, MD   Encounter Date: 06/27/2020   PT End of Session - 06/27/20 1236    Visit Number 6    Date for PT Re-Evaluation 08/15/20    Authorization Type Aetna Medicare    PT Start Time 1234    PT Stop Time 1313    PT Time Calculation (min) 39 min    Activity Tolerance Patient tolerated treatment well    Behavior During Therapy [facility] for tasks assessed/performed           Past Medical History:  Diagnosis Date  . Adenomatous polyps 01/2016  . Anemia   . Breast cancer ([place]) 1990   bilateral mastectomy, adenoca breast-left MRM, reconstruction, chemo  . Bronchitis last 2 weeks   saw dr Felipa Eth 02-28-2014, he said no antibiotics needed, nonproductive cough  . Complication of anesthesia   . Cystitis    cytoxen, had once or twice  . Family history of breast cancer   . History of breast cancer   . History of radiation therapy 2/10, 2/12, 2/18, 2/24, 05/29/14   vaginal cuff/ 30 Gy/ 5 fx  . Hypertension   . Macular degeneration   . Neck pain    taking physictal therapy last 2 weeks  . Osteoporosis    On Prolia.  Marland Kitchen PONV (postoperative nausea and vomiting)   . Shoulder pain    taking physical therapy for last few weeks  . Uterine cancer ([place]) 03/21/2014   MLH1/PMS2 LOH  . Uterine fibroid   . VAIN I (vaginal intraepithelial neoplasia grade I) 2018   positive HR HPV.  . Vasovagal syncopes     Past Surgical History:  Procedure Laterality Date  . BREAST IMPLANT REMOVAL  6/94   left breast  . BREAST RECONSTRUCTION  1990   left  . CATARACT EXTRACTION Bilateral   . EXCISION VAGINAL CYST    . MASTECTOMY Right 10/08/06   prophylactic  . RADICAL MASTECTOMY LND  1990   left, chemo  done  . ROBOTIC ASSISTED TOTAL HYSTERECTOMY WITH BILATERAL SALPINGO OOPHERECTOMY Bilateral 03/21/2014   Procedure: ROBOTIC ASSISTED TOTAL HYSTERECTOMY WITH BILATERAL SALPINGO OOPHORECTOMY ;  Surgeon: Janie Morning, MD;  Location: [facility];  Service: Gynecology;  Laterality: Bilateral;    There were no vitals filed for this visit.   Subjective Assessment - 06/27/20 1357    Subjective I was very sore after my last session. I think it was muscular, it went away with Advil.    Pertinent History Rt shoulder cortisone injection 2 weeks ago with signif relief    Currently in Pain? No/denies    Multiple Pain Sites No                             [facility] Adult PT Treatment/Exercise - 06/27/20 0001      Shoulder Exercises: Seated   Flexion AROM;Strengthening;Right;10 reps;Weights    Flexion Weight (lbs) 1    Abduction Strengthening;Both;10 reps;Weights    ABduction Weight (lbs) 1      Shoulder Exercises: Standing   Extension Strengthening;Both;10 reps;Theraband    Theraband Level (Shoulder Extension) Level 3 (Green)    Row Strengthening;Both;Theraband;15 reps    Theraband Level (Shoulder Row) Level 3 (Green)  VC for posture     Shoulder Exercises: Pulleys   Flexion 3 minutes      Manual Therapy   Soft tissue mobilization RT upper trap, lateral deltoid and upper arm                    PT Short Term Goals - 06/11/20 1409      PT SHORT TERM GOAL #1   Title be independent in initial HEP    Time 3    Period Weeks    Status Achieved    Target Date 06/13/20      PT SHORT TERM GOAL #3   Title Pt will be able to demo improved posture in sitting and standing without discomfort with neck and scapular retraction    Time 4    Period Weeks    Status Achieved             PT Long Term Goals - 06/11/20 1434      PT LONG TERM GOAL #2   Title Pt will be able to transfer 2lb weight from counter to overhead shelf without pain and with good scapulohumeral rhythm.     Time 12    Period Weeks    Status Achieved                 Plan - 06/27/20 1237    Clinical Impression Statement Pt arrives pain free today. She reports she was very sore after her last session and has some "sore spots" at her upper trap. Pt did have a very dense Rt upper trap and tissue thickness along her later deltoid. Manual soft tissue work seemed to help at least get some better myofascial gliding. pt had no pain with exercises nor a painful arc with flexion/abduction.    Personal Factors and Comorbidities Age;Comorbidity 1;Comorbidity 2    Comorbidities breast cancer with masectomy; osteoprosis    Examination-Activity Limitations Reach Overhead;Hygiene/Grooming;Lift;Carry;Dressing    Examination-Participation Restrictions Cleaning;Meal Prep;Laundry;Shop    Stability/Clinical Decision Making Stable/Uncomplicated    Rehab Potential Excellent    PT Frequency 1x / week    PT Duration 12 weeks    PT Treatment/Interventions ADLs/Self Care Home Management;Electrical Stimulation;Cryotherapy;Iontophoresis 4mg /ml Dexamethasone;Moist Heat;Traction;Neuromuscular re-education;Therapeutic exercise;Therapeutic activities;Functional mobility training;Patient/family education;Manual techniques;Spinal Manipulations;Joint Manipulations;Passive range of motion;Dry needling;Taping    PT Next Visit Plan continue postural reed with manual therapy as needed for cervical posture retraction ease, progress shoulder and scapular stab/strength    PT Home Exercise Plan Access Code: [email or web address]    Consulted and Agree with Plan of Care Patient           Patient will benefit from skilled therapeutic intervention in order to improve the following deficits and impairments:  Decreased strength,Pain,Hypomobility,Postural dysfunction,Impaired UE functional use  Visit Diagnosis: Chronic right shoulder pain  Abnormal posture  Muscle weakness (generalized)  Other lack of coordination     Problem  List Patient Active Problem List   Diagnosis Date Noted  . Age-related osteoporosis without current pathological fracture 03/19/2020  . Personal history of colonic polyps 03/19/2020  . Transient cerebral ischemia 09/15/2017  . Pure hypercholesterolemia 09/15/2017  . Pulmonary nodule 09/15/2017  . Hypertension 09/15/2017  . Gastro-esophageal reflux disease without esophagitis 09/15/2017  . VAIN I (vaginal intraepithelial neoplasia grade I) 06/23/2017  . Genetic testing 05/23/2014  . History of breast cancer   . Family history of breast cancer   . Endometrial cancer ([place]) 03/21/2014  . Uterine cancer ([place]) 03/21/2014  . S/P mastectomy, bilateral  05/02/2013  . Situational stress 05/02/2013  . Osteoporosis, unspecified 05/02/2013  . Breast cancer, left breast ([place]) 05/02/2013    Petrona Wyeth, PTA 06/27/2020, 2:06 PM  [facility] [address], [place], [place], [address] Phone: [phone]   Fax:  [phone]  Name: Beth Simon MRN: [id number] Date of Birth: Jun 14, 1940

## 2020-06-28 NOTE — Progress Notes (Signed)
Subjective: 80 y.o. returns the office today for painful, elongated, thickened toenails which she is unable to trim herself as well as for calluses.  Denies any redness or drainage or any swelling or any open sores.  No swelling or redness.  She has been getting cramping in her legs intermittently.  She has no other concerns today.  Objective: AAO 3, NAD DP/PT pulses palpable, CRT less than 3 seconds Nails mildly hypertrophic, dystrophic, elongated, brittle, discolored 10. There is tenderness overlying the nails 1-5 bilaterally. There is no surrounding erythema or drainage along the nail sites. Hyperkeratotic lesions of the distal aspect left second digit as well as the dorsal lateral aspect of the fifth digit left foot. No underlying ulceration, drainage or other signs of infection.  No open lesions or other pre-ulcerative lesions are identified. No pain with calf compression, swelling, warmth, erythema. Overall, no changes   Assessment: Symptomatic onychomycosis; hyperkeratotic lesion left foot  Plan: -Treatment options including alternatives, risks, complications were discussed -Nails sharply debrided 10 without complication/bleeding. -Hyperkeratotic lesion sharply debrided 2 without complication/bleeding. -For the cramping she is unable to physical therapy.  We will refer her to therapy I think it is coming more from tightness to her feet and her muscles in her legs.  She has no claudication symptoms.  No significant swelling. -Follow-up in 4 weeks at her request or sooner if any problems are to arise. In the meantime, encouraged to call the office with any questions, concerns, changes symptoms.  Celesta Gentile, DPM

## 2020-07-03 ENCOUNTER — Encounter: Payer: Self-pay | Admitting: Physical Therapy

## 2020-07-03 ENCOUNTER — Other Ambulatory Visit: Payer: Self-pay

## 2020-07-03 ENCOUNTER — Ambulatory Visit: Payer: Medicare HMO | Attending: Orthopaedic Surgery | Admitting: Physical Therapy

## 2020-07-03 DIAGNOSIS — M25511 Pain in right shoulder: Secondary | ICD-10-CM | POA: Diagnosis not present

## 2020-07-03 DIAGNOSIS — M6281 Muscle weakness (generalized): Secondary | ICD-10-CM | POA: Insufficient documentation

## 2020-07-03 DIAGNOSIS — G8929 Other chronic pain: Secondary | ICD-10-CM | POA: Insufficient documentation

## 2020-07-03 DIAGNOSIS — R293 Abnormal posture: Secondary | ICD-10-CM | POA: Insufficient documentation

## 2020-07-03 NOTE — Therapy (Signed)
[facility] [address], [place], [place], [address] Phone: [phone]   Fax:  [phone]  Physical Therapy Treatment  Patient Details  Name: Beth Simon MRN: [id number] Date of Birth: [birth year] Referring Provider (PT): Hiram Gash, MD   Encounter Date: 07/03/2020   PT End of Session - 07/03/20 1617    Visit Number 7    Date for PT Re-Evaluation 08/15/20    Authorization Type Aetna Medicare    PT Start Time 2542   Pt late   PT Stop Time 1615    PT Time Calculation (min) 38 min    Activity Tolerance Patient tolerated treatment well    Behavior During Therapy [facility] for tasks assessed/performed           Past Medical History:  Diagnosis Date  . Adenomatous polyps 01/2016  . Anemia   . Breast cancer ([place]) 1990   bilateral mastectomy, adenoca breast-left MRM, reconstruction, chemo  . Bronchitis last 2 weeks   saw dr Felipa Eth 02-28-2014, he said no antibiotics needed, nonproductive cough  . Complication of anesthesia   . Cystitis    cytoxen, had once or twice  . Family history of breast cancer   . History of breast cancer   . History of radiation therapy 2/10, 2/12, 2/18, 2/24, 05/29/14   vaginal cuff/ 30 Gy/ 5 fx  . Hypertension   . Macular degeneration   . Neck pain    taking physictal therapy last 2 weeks  . Osteoporosis    On Prolia.  Marland Kitchen PONV (postoperative nausea and vomiting)   . Shoulder pain    taking physical therapy for last few weeks  . Uterine cancer ([place]) 03/21/2014   MLH1/PMS2 LOH  . Uterine fibroid   . VAIN I (vaginal intraepithelial neoplasia grade I) 2018   positive HR HPV.  . Vasovagal syncopes     Past Surgical History:  Procedure Laterality Date  . BREAST IMPLANT REMOVAL  6/94   left breast  . BREAST RECONSTRUCTION  1990   left  . CATARACT EXTRACTION Bilateral   . EXCISION VAGINAL CYST    . MASTECTOMY Right 10/08/06   prophylactic  . RADICAL MASTECTOMY LND  1990    left, chemo done  . ROBOTIC ASSISTED TOTAL HYSTERECTOMY WITH BILATERAL SALPINGO OOPHERECTOMY Bilateral 03/21/2014   Procedure: ROBOTIC ASSISTED TOTAL HYSTERECTOMY WITH BILATERAL SALPINGO OOPHORECTOMY ;  Surgeon: Janie Morning, MD;  Location: [facility];  Service: Gynecology;  Laterality: Bilateral;    There were no vitals filed for this visit.   Subjective Assessment - 07/03/20 1538    Subjective The massage really helped last time.  I get very sore with the exercises sometimes.    Pertinent History Rt shoulder cortisone injection 2 weeks ago with signif relief    Diagnostic tests xray Rt shoulder: good GH joint space, mild/mod AC joint arthritis    Patient Stated Goals continue to have relief and prevent return of pain    Currently in Pain? Yes    Pain Score 2     Pain Location Shoulder    Pain Orientation Right;Lateral    Pain Descriptors / Indicators Sore;Sharp    Pain Type Acute pain    Pain Onset More than a month ago    Pain Frequency Occasional                             [facility] Adult PT Treatment/Exercise -  07/03/20 0001      Exercises   Exercises Neck;Shoulder      Shoulder Exercises: Supine   Horizontal ABduction Strengthening;Both;15 reps;Theraband    Theraband Level (Shoulder Horizontal ABduction) Level 2 (Red)    Horizontal ABduction Limitations VC eccentric control    External Rotation Strengthening;Both;15 reps;Theraband    Theraband Level (Shoulder External Rotation) Level 2 (Red)    Flexion AAROM;Both;5 reps    Flexion Limitations with dowel, focus on control during slow motion conc/eccentric phases      Shoulder Exercises: Standing   Extension Strengthening;Both;10 reps;Theraband    Theraband Level (Shoulder Extension) Level 3 (Green)    Extension Limitations TC scapular retraction initiation before shoulder ext    Row Strengthening;Both;10 reps;Theraband    Theraband Level (Shoulder Row) Level 3 (Green)    Row Limitations scapular row with  neck retraction and ab engagement to avoid trunk ext      Manual Therapy   Manual Therapy Soft tissue mobilization;Joint mobilization    Joint Mobilization Gr II/III upper t-spine PAs in Lt SL    Soft tissue mobilization Rt deltoid insertion and posterior deltoid, upper trap, pectorals    Passive ROM scapular PNF on Rt retraction/depression progressing to neuro re-ed Pt moving with PT                    PT Short Term Goals - 06/11/20 1409      PT SHORT TERM GOAL #1   Title be independent in initial HEP    Time 3    Period Weeks    Status Achieved    Target Date 06/13/20      PT SHORT TERM GOAL #3   Title Pt will be able to demo improved posture in sitting and standing without discomfort with neck and scapular retraction    Time 4    Period Weeks    Status Achieved             PT Long Term Goals - 06/11/20 1434      PT LONG TERM GOAL #2   Title Pt will be able to transfer 2lb weight from counter to overhead shelf without pain and with good scapulohumeral rhythm.    Time 12    Period Weeks    Status Achieved                 Plan - 07/03/20 1658    Clinical Impression Statement Pt continues to report soreness across Rt shoulder into deltoid insertion.  She has ongoing + painful arc with Rt shoulder flexion and abd.  She has signif stiffness in upper t-spine and throughout c-spine feeding into forward head and rounded, IR shoulder posture.  PT performed manual techniques to improve elongation along posterior cervical spine and ext of t-spine followed by standing band ther ex for scapular activation within row/ext.  PT cued cervical retraction with band exercises and noted improved ability to perform this after manual techniques today.  Pt needed VC for abdominal activation to avoid trunk ext with scapular strengthening.  Pt is very sore at deltoid insertion and has some tissue thickness along posterior deltoid which was addressed with STM today as well.  Pt will  benefit from ongoing PT to address posture and strength to reduce pain and improve function.    Comorbidities breast cancer with masectomy; osteoprosis    PT Frequency 1x / week    PT Duration 12 weeks    PT Treatment/Interventions ADLs/Self Care Home Management;Electrical  Stimulation;Cryotherapy;Iontophoresis '4mg'$ /ml Dexamethasone;Moist Heat;Traction;Neuromuscular re-education;Therapeutic exercise;Therapeutic activities;Functional mobility training;Patient/family education;Manual techniques;Spinal Manipulations;Joint Manipulations;Passive range of motion;Dry needling;Taping    PT Next Visit Plan continue postural reed with manual therapy as needed for cervical posture retraction ease, progress shoulder and scapular stab/strength    PT Home Exercise Plan Access Code: [email or web address]    Consulted and Agree with Plan of Care Patient           Patient will benefit from skilled therapeutic intervention in order to improve the following deficits and impairments:     Visit Diagnosis: Chronic right shoulder pain  Abnormal posture  Muscle weakness (generalized)     Problem List Patient Active Problem List   Diagnosis Date Noted  . Age-related osteoporosis without current pathological fracture 03/19/2020  . Personal history of colonic polyps 03/19/2020  . Transient cerebral ischemia 09/15/2017  . Pure hypercholesterolemia 09/15/2017  . Pulmonary nodule 09/15/2017  . Hypertension 09/15/2017  . Gastro-esophageal reflux disease without esophagitis 09/15/2017  . VAIN I (vaginal intraepithelial neoplasia grade I) 06/23/2017  . Genetic testing 05/23/2014  . History of breast cancer   . Family history of breast cancer   . Endometrial cancer ([place]) 03/21/2014  . Uterine cancer ([place]) 03/21/2014  . S/P mastectomy, bilateral 05/02/2013  . Situational stress 05/02/2013  . Osteoporosis, unspecified 05/02/2013  . Breast cancer, left breast ([place]) 05/02/2013   Baruch Merl, PT 07/03/20 5:04  PM   [facility] [address] [place], [place], [address] Phone: [phone]   Fax:  [phone]  Name: MELISSSA DONNER MRN: [id number] Date of Birth: Apr 08, 1940

## 2020-07-09 DIAGNOSIS — M79605 Pain in left leg: Secondary | ICD-10-CM | POA: Diagnosis not present

## 2020-07-09 DIAGNOSIS — M79604 Pain in right leg: Secondary | ICD-10-CM | POA: Diagnosis not present

## 2020-07-10 ENCOUNTER — Encounter: Payer: Medicare HMO | Admitting: Physical Therapy

## 2020-07-11 ENCOUNTER — Other Ambulatory Visit: Payer: Self-pay

## 2020-07-11 ENCOUNTER — Ambulatory Visit: Payer: Medicare HMO

## 2020-07-11 DIAGNOSIS — R293 Abnormal posture: Secondary | ICD-10-CM

## 2020-07-11 DIAGNOSIS — M6281 Muscle weakness (generalized): Secondary | ICD-10-CM

## 2020-07-11 DIAGNOSIS — M25511 Pain in right shoulder: Secondary | ICD-10-CM | POA: Diagnosis not present

## 2020-07-11 DIAGNOSIS — G8929 Other chronic pain: Secondary | ICD-10-CM | POA: Diagnosis not present

## 2020-07-11 NOTE — Therapy (Signed)
[facility] [address], [place], [place], [address] Phone: [phone]   Fax:  [phone]  Physical Therapy Treatment  Patient Details  Name: Beth Simon MRN: [id number] Date of Birth: Sep 28, 1940 Referring Provider (PT): Hiram Gash, MD   Encounter Date: 07/11/2020   PT End of Session - 07/11/20 1525    Visit Number 8    Date for PT Re-Evaluation 08/15/20    Authorization Type Aetna Medicare    PT Start Time 1450    PT Stop Time 1530    PT Time Calculation (min) 40 min    Activity Tolerance Patient tolerated treatment well    Behavior During Therapy [facility] for tasks assessed/performed           Past Medical History:  Diagnosis Date  . Adenomatous polyps 01/2016  . Anemia   . Breast cancer ([place]) 1990   bilateral mastectomy, adenoca breast-left MRM, reconstruction, chemo  . Bronchitis last 2 weeks   saw dr Felipa Eth 02-28-2014, he said no antibiotics needed, nonproductive cough  . Complication of anesthesia   . Cystitis    cytoxen, had once or twice  . Family history of breast cancer   . History of breast cancer   . History of radiation therapy 2/10, 2/12, 2/18, 2/24, 05/29/14   vaginal cuff/ 30 Gy/ 5 fx  . Hypertension   . Macular degeneration   . Neck pain    taking physictal therapy last 2 weeks  . Osteoporosis    On Prolia.  Marland Kitchen PONV (postoperative nausea and vomiting)   . Shoulder pain    taking physical therapy for last few weeks  . Uterine cancer ([place]) 03/21/2014   MLH1/PMS2 LOH  . Uterine fibroid   . VAIN I (vaginal intraepithelial neoplasia grade I) 2018   positive HR HPV.  . Vasovagal syncopes     Past Surgical History:  Procedure Laterality Date  . BREAST IMPLANT REMOVAL  6/94   left breast  . BREAST RECONSTRUCTION  1990   left  . CATARACT EXTRACTION Bilateral   . EXCISION VAGINAL CYST    . MASTECTOMY Right 10/08/06   prophylactic  . RADICAL MASTECTOMY LND  1990   left, chemo  done  . ROBOTIC ASSISTED TOTAL HYSTERECTOMY WITH BILATERAL SALPINGO OOPHERECTOMY Bilateral 03/21/2014   Procedure: ROBOTIC ASSISTED TOTAL HYSTERECTOMY WITH BILATERAL SALPINGO OOPHORECTOMY ;  Surgeon: Janie Morning, MD;  Location: [facility];  Service: Gynecology;  Laterality: Bilateral;    There were no vitals filed for this visit.   Subjective Assessment - 07/11/20 1453    Subjective The pain in my Rt shoulder is "just there".    Currently in Pain? Yes    Pain Score 2     Pain Location Shoulder    Pain Orientation Right;Lateral    Pain Descriptors / Indicators Sore    Pain Type Acute pain    Pain Onset More than a month ago    Pain Frequency Intermittent    Aggravating Factors  reaching for something    Pain Relieving Factors it is brief most of the time.                             [facility] Adult PT Treatment/Exercise - 07/11/20 0001      Shoulder Exercises: Supine   Horizontal ABduction Strengthening;Both;Theraband;20 reps    Theraband Level (Shoulder Horizontal ABduction) Level 2 (Red)    Horizontal ABduction Limitations  VC eccentric control    External Rotation Strengthening;Both;Theraband;20 reps    Theraband Level (Shoulder External Rotation) Level 2 (Red)    Flexion AAROM;Both;5 reps    Flexion Limitations with dowel, focus on control during slow motion conc/eccentric phases.  1.5# added      Shoulder Exercises: Seated   Flexion AROM;Strengthening;Right;10 reps;Weights    Flexion Weight (lbs) 1    Abduction Strengthening;Both;10 reps;Weights    ABduction Weight (lbs) 1      Manual Therapy   Manual Therapy Soft tissue mobilization;Joint mobilization    Joint Mobilization distraction, ER/IR Rt shoulder    Soft tissue mobilization Rt deltoid insertion, lateral deltoid                    PT Short Term Goals - 06/11/20 1409      PT SHORT TERM GOAL #1   Title be independent in initial HEP    Time 3    Period Weeks    Status Achieved     Target Date 06/13/20      PT SHORT TERM GOAL #3   Title Pt will be able to demo improved posture in sitting and standing without discomfort with neck and scapular retraction    Time 4    Period Weeks    Status Achieved             PT Long Term Goals - 06/11/20 1434      PT LONG TERM GOAL #2   Title Pt will be able to transfer 2lb weight from counter to overhead shelf without pain and with good scapulohumeral rhythm.    Time 12    Period Weeks    Status Achieved                 Plan - 07/11/20 1506    Clinical Impression Statement Pt reports 30-35% overall improvement in Rt shoulder pain since the start of care.  Pt continues to report soreness across Rt shoulder into deltoid insertion.  Pt is working on scapular retraction with daily tasks and with exercises.  PT provided verbal and tactile cueing for scapular depression and retraction.  Pt is very sore at deltoid insertion and has some tissue thickness along posterior deltoid which was addressed with STM today as well.  Pt will benefit from ongoing PT to address posture and strength to reduce pain and improve function.    PT Treatment/Interventions ADLs/Self Care Home Management;Electrical Stimulation;Cryotherapy;Iontophoresis 4mg /ml Dexamethasone;Moist Heat;Traction;Neuromuscular re-education;Therapeutic exercise;Therapeutic activities;Functional mobility training;Patient/family education;Manual techniques;Spinal Manipulations;Joint Manipulations;Passive range of motion;Dry needling;Taping    PT Next Visit Plan continue postural reed with manual therapy as needed for cervical posture retraction ease, progress shoulder and scapular stab/strength    PT Home Exercise Plan Access Code: [email or web address]    Consulted and Agree with Plan of Care Patient           Patient will benefit from skilled therapeutic intervention in order to improve the following deficits and impairments:  Decreased strength,Pain,Hypomobility,Postural  dysfunction,Impaired UE functional use  Visit Diagnosis: Chronic right shoulder pain  Abnormal posture  Muscle weakness (generalized)     Problem List Patient Active Problem List   Diagnosis Date Noted  . Age-related osteoporosis without current pathological fracture 03/19/2020  . Personal history of colonic polyps 03/19/2020  . Transient cerebral ischemia 09/15/2017  . Pure hypercholesterolemia 09/15/2017  . Pulmonary nodule 09/15/2017  . Hypertension 09/15/2017  . Gastro-esophageal reflux disease without esophagitis 09/15/2017  . VAIN I (vaginal intraepithelial neoplasia grade  I) 06/23/2017  . Genetic testing 05/23/2014  . History of breast cancer   . Family history of breast cancer   . Endometrial cancer ([place]) 03/21/2014  . Uterine cancer ([place]) 03/21/2014  . S/P mastectomy, bilateral 05/02/2013  . Situational stress 05/02/2013  . Osteoporosis, unspecified 05/02/2013  . Breast cancer, left breast ([place]) 05/02/2013   Sigurd Sos, PT 07/11/20 3:31 PM   [facility] [address], [place], [place], [address] Phone: [phone]   Fax:  [phone]  Name: Beth Simon MRN: [id number] Date of Birth: 12-18-1940

## 2020-07-17 ENCOUNTER — Other Ambulatory Visit: Payer: Self-pay

## 2020-07-17 ENCOUNTER — Ambulatory Visit: Payer: Medicare HMO | Admitting: Physical Therapy

## 2020-07-17 ENCOUNTER — Encounter: Payer: Self-pay | Admitting: Physical Therapy

## 2020-07-17 DIAGNOSIS — M25511 Pain in right shoulder: Secondary | ICD-10-CM | POA: Diagnosis not present

## 2020-07-17 DIAGNOSIS — R293 Abnormal posture: Secondary | ICD-10-CM

## 2020-07-17 DIAGNOSIS — G8929 Other chronic pain: Secondary | ICD-10-CM

## 2020-07-17 DIAGNOSIS — M6281 Muscle weakness (generalized): Secondary | ICD-10-CM | POA: Diagnosis not present

## 2020-07-17 NOTE — Therapy (Signed)
[facility] [address] [place], [place], [address] Phone: [phone]   Fax:  [phone]  Physical Therapy Treatment  Patient Details  Name: Beth Simon MRN: [id number] Date of Birth: October 29, 1940 Referring Provider (PT): Bjorn Pippin, MD   Encounter Date: 07/17/2020   PT End of Session - 07/17/20 1541    Visit Number 9    Date for PT Re-Evaluation 08/15/20    Authorization Type [employer] Medicare    PT Start Time 1535    PT Stop Time 1615    PT Time Calculation (min) 40 min    Activity Tolerance Patient tolerated treatment well    Behavior During Therapy [facility] for tasks assessed/performed           Past Medical History:  Diagnosis Date  . Adenomatous polyps 01/2016  . Anemia   . Breast cancer (HCC) 1990   bilateral mastectomy, adenoca breast-left MRM, reconstruction, chemo  . Bronchitis last 2 weeks   saw dr Pete Glatter 02-28-2014, he said no antibiotics needed, nonproductive cough  . Complication of anesthesia   . Cystitis    cytoxen, had once or twice  . Family history of breast cancer   . History of breast cancer   . History of radiation therapy 2/10, 2/12, 2/18, 2/24, 05/29/14   vaginal cuff/ 30 Gy/ 5 fx  . Hypertension   . Macular degeneration   . Neck pain    taking physictal therapy last 2 weeks  . Osteoporosis    On Prolia.  Marland Kitchen PONV (postoperative nausea and vomiting)   . Shoulder pain    taking physical therapy for last few weeks  . Uterine cancer (HCC) 03/21/2014   MLH1/PMS2 LOH  . Uterine fibroid   . VAIN I (vaginal intraepithelial neoplasia grade I) 2018   positive HR HPV.  . Vasovagal syncopes     Past Surgical History:  Procedure Laterality Date  . BREAST IMPLANT REMOVAL  6/94   left breast  . BREAST RECONSTRUCTION  1990   left  . CATARACT EXTRACTION Bilateral   . EXCISION VAGINAL CYST    . MASTECTOMY Right 10/08/06   prophylactic  . RADICAL MASTECTOMY LND  1990   left, chemo  done  . ROBOTIC ASSISTED TOTAL HYSTERECTOMY WITH BILATERAL SALPINGO OOPHERECTOMY Bilateral 03/21/2014   Procedure: ROBOTIC ASSISTED TOTAL HYSTERECTOMY WITH BILATERAL SALPINGO OOPHORECTOMY ;  Surgeon: Laurette Schimke, MD;  Location: [facility];  Service: Gynecology;  Laterality: Bilateral;    There were no vitals filed for this visit.   Subjective Assessment - 07/17/20 1536    Subjective My shoulder is back and forth with progress.  Last night I used my Rt shoulder a lot taking notes while watching a DVD and it hurt and was still sore this morning but then I stretched and that helped.    Pertinent History Rt shoulder cortisone injection 2 weeks ago with signif relief    Diagnostic tests xray Rt shoulder: good GH joint space, mild/mod AC joint arthritis    Patient Stated Goals continue to have relief and prevent return of pain    Currently in Pain? Yes    Pain Score 3    was 6/10 this morning   Pain Location Shoulder    Pain Orientation Right;Anterior;Lateral    Pain Descriptors / Indicators Sore    Pain Type Acute pain    Pain Onset More than a month ago    Pain Frequency Intermittent    Aggravating Factors  overuse, reaching                             [facility] Adult PT Treatment/Exercise - 07/17/20 0001      Self-Care   Self-Care Other Self-Care Comments    Other Self-Care Comments  ice cup massage discussion for Rt bicep/RC tendons, postural set up for watching TV/DVDs and note taking, avoid cross body reaching with Rt UE with remote if possible      Exercises   Exercises Shoulder;Neck      Shoulder Exercises: Supine   Horizontal ABduction Strengthening;Both;10 reps;Theraband    Theraband Level (Shoulder Horizontal ABduction) Level 2 (Red)    Horizontal ABduction Limitations VC scapular retraction first, and maintained with eccentric phase, VC for painfree range only    Flexion AAROM;Right;5 reps    Flexion Limitations PT guided ROM with VC for scapular and GH stab  and mechanics for concentric/eccentric      Shoulder Exercises: Standing   Extension Strengthening;Both;5 reps;Theraband    Theraband Level (Shoulder Extension) Level 1 (Yellow)    Extension Limitations scapular engagement first, then pull, maintain scapular retraction during eccentric phase, TC and VC    Row Strengthening;Both;5 reps;Theraband    Theraband Level (Shoulder Row) Level 2 (Red)    Row Limitations scapular engagement first, then pull, maintain scapular retraction during eccentric phase, TC and VC      Shoulder Exercises: Pulleys   Flexion 3 minutes      Manual Therapy   Manual Therapy Soft tissue mobilization;Joint mobilization    Joint Mobilization Rt GH joint posterior and inf glides Gr II/III    Soft tissue mobilization cross friction massage to anterior RC tendons and bicep tendon                    PT Short Term Goals - 06/11/20 1409      PT SHORT TERM GOAL #1   Title be independent in initial HEP    Time 3    Period Weeks    Status Achieved    Target Date 06/13/20      PT SHORT TERM GOAL #3   Title Pt will be able to demo improved posture in sitting and standing without discomfort with neck and scapular retraction    Time 4    Period Weeks    Status Achieved             PT Long Term Goals - 06/11/20 1434      PT LONG TERM GOAL #2   Title Pt will be able to transfer 2lb weight from counter to overhead shelf without pain and with good scapulohumeral rhythm.    Time 12    Period Weeks    Status Achieved                 Plan - 07/17/20 1716    Clinical Impression Statement Pt reported increased Rt anterior shoulder pain last night and today after taking notes with Rt dominant UE while watching a 2-hour informational DVD.  PT inquired about posture and positioning and made some recommendations for the future to limit aggravation of pain with this.  Pt was very tender in bicep and supraspinatus tendons today so manual therapy was  targeted at these inflammed tendons.  Pt continues to need TC/VC for scapular lead with tband exercises which she was able to demo with improvement today although is quick to fatigue and reverts to  hiking as she does so.  PT encouraged her to focus on row, ext and horiz abd only, low reps for quality vs quantity b/w now and next visit.  Ice cup massage education and demo with ice stick was given for RC/bicep tendons at home x 2-3 min 2x/day.  Continue along POC.    Comorbidities breast cancer with masectomy; osteoprosis    PT Next Visit Plan f/u on Rt RC/bicep tendon pain, did Pt do ice cup massage, continue low rep quality of tband row, ext and supine horiz abd using red/yellow bands, decompression    PT Home Exercise Plan Access Code: [email or web address]    Consulted and Agree with Plan of Care Patient           Patient will benefit from skilled therapeutic intervention in order to improve the following deficits and impairments:     Visit Diagnosis: Chronic right shoulder pain  Abnormal posture  Muscle weakness (generalized)     Problem List Patient Active Problem List   Diagnosis Date Noted  . Age-related osteoporosis without current pathological fracture 03/19/2020  . Personal history of colonic polyps 03/19/2020  . Transient cerebral ischemia 09/15/2017  . Pure hypercholesterolemia 09/15/2017  . Pulmonary nodule 09/15/2017  . Hypertension 09/15/2017  . Gastro-esophageal reflux disease without esophagitis 09/15/2017  . VAIN I (vaginal intraepithelial neoplasia grade I) 06/23/2017  . Genetic testing 05/23/2014  . History of breast cancer   . Family history of breast cancer   . Endometrial cancer ([place]) 03/21/2014  . Uterine cancer ([place]) 03/21/2014  . S/P mastectomy, bilateral 05/02/2013  . Situational stress 05/02/2013  . Osteoporosis, unspecified 05/02/2013  . Breast cancer, left breast ([place]) 05/02/2013    Aundrea Higginbotham E [place] 07/17/2020, 5:21 PM  [facility] [address], [place], [place], [address] Phone: [phone]   Fax:  [phone]  Name: Beth Simon MRN: [id number] Date of Birth: 09-20-1940

## 2020-07-24 ENCOUNTER — Other Ambulatory Visit: Payer: Self-pay

## 2020-07-24 ENCOUNTER — Encounter: Payer: Self-pay | Admitting: Physical Therapy

## 2020-07-24 ENCOUNTER — Ambulatory Visit: Payer: Medicare HMO | Admitting: Physical Therapy

## 2020-07-24 DIAGNOSIS — G8929 Other chronic pain: Secondary | ICD-10-CM | POA: Diagnosis not present

## 2020-07-24 DIAGNOSIS — R293 Abnormal posture: Secondary | ICD-10-CM | POA: Diagnosis not present

## 2020-07-24 DIAGNOSIS — M6281 Muscle weakness (generalized): Secondary | ICD-10-CM | POA: Diagnosis not present

## 2020-07-24 DIAGNOSIS — M25511 Pain in right shoulder: Secondary | ICD-10-CM

## 2020-07-24 NOTE — Therapy (Signed)
[facility] [address] [place], [place], [address] Phone: [phone]   Fax:  [phone]  Physical Therapy Treatment  Patient Details  Name: Beth Simon MRN: [id number] Date of Birth: [birth year] Referring Provider (PT): Hiram Gash, MD   Progress Note Reporting Period 05/23/20 to 07/24/20  See note below for Objective Data and Assessment of Progress/Goals.      Encounter Date: 07/24/2020   PT End of Session - 07/24/20 1536    Visit Number 10    Date for PT Re-Evaluation 08/15/20    Authorization Type Aetna Medicare    PT Start Time 5885   Pt late   PT Stop Time 1622   10 min ice end of session   PT Time Calculation (min) 45 min    Activity Tolerance Patient tolerated treatment well;Patient limited by pain    Behavior During Therapy [facility] for tasks assessed/performed           Past Medical History:  Diagnosis Date  . Adenomatous polyps 01/2016  . Anemia   . Breast cancer ([place]) 1990   bilateral mastectomy, adenoca breast-left MRM, reconstruction, chemo  . Bronchitis last 2 weeks   saw dr Felipa Eth 02-28-2014, he said no antibiotics needed, nonproductive cough  . Complication of anesthesia   . Cystitis    cytoxen, had once or twice  . Family history of breast cancer   . History of breast cancer   . History of radiation therapy 2/10, 2/12, 2/18, 2/24, 05/29/14   vaginal cuff/ 30 Gy/ 5 fx  . Hypertension   . Macular degeneration   . Neck pain    taking physictal therapy last 2 weeks  . Osteoporosis    On Prolia.  Marland Kitchen PONV (postoperative nausea and vomiting)   . Shoulder pain    taking physical therapy for last few weeks  . Uterine cancer ([place]) 03/21/2014   MLH1/PMS2 LOH  . Uterine fibroid   . VAIN I (vaginal intraepithelial neoplasia grade I) 2018   positive HR HPV.  . Vasovagal syncopes     Past Surgical History:  Procedure Laterality Date  . BREAST IMPLANT REMOVAL  6/94   left breast   . BREAST RECONSTRUCTION  1990   left  . CATARACT EXTRACTION Bilateral   . EXCISION VAGINAL CYST    . MASTECTOMY Right 10/08/06   prophylactic  . RADICAL MASTECTOMY LND  1990   left, chemo done  . ROBOTIC ASSISTED TOTAL HYSTERECTOMY WITH BILATERAL SALPINGO OOPHERECTOMY Bilateral 03/21/2014   Procedure: ROBOTIC ASSISTED TOTAL HYSTERECTOMY WITH BILATERAL SALPINGO OOPHORECTOMY ;  Surgeon: Janie Morning, MD;  Location: [facility];  Service: Gynecology;  Laterality: Bilateral;    There were no vitals filed for this visit.   Subjective Assessment - 07/24/20 1536    Subjective The massage last time really helped.  I have pain more often than not.  Pt reports 35% improvement in Rt shoulder pain.  I am more aware of my posture and what contributes to my pain.    Pertinent History Rt shoulder cortisone injection 2 weeks ago with signif relief    Diagnostic tests xray Rt shoulder: good GH joint space, mild/mod AC joint arthritis    Patient Stated Goals continue to have relief and prevent return of pain    Currently in Pain? Yes    Pain Score 2     Pain Location Shoulder    Pain Orientation Right;Lateral;Anterior    Pain Descriptors / Indicators Sore  Pain Type Acute pain    Pain Onset More than a month ago    Pain Frequency Intermittent    Aggravating Factors  reaching    Pain Relieving Factors stretching, being more aware of posture              [facility] PT Assessment - 07/24/20 0001      Assessment   Medical Diagnosis M75.81 (ICD-10-CM) - Right rotator cuff tendonitis    Referring Provider (PT) Hiram Gash, MD    Onset Date/Surgical Date --   Nov 2021   Hand Dominance Right    Next MD Visit as needed, MD will call to check on her    Prior Therapy for pelvic floor      Posture/Postural Control   Postural Limitations Increased thoracic kyphosis;Forward head;Rounded Shoulders      AROM   AROM Assessment Site Shoulder    Right/Left Shoulder Right    Right Shoulder Extension 45  Degrees    Right Shoulder Flexion 165 Degrees   signif painful arc   Right Shoulder ABduction 90 Degrees   pain limits     Strength   Strength Assessment Site Shoulder    Right/Left Shoulder Right    Right Shoulder Flexion 4+/5    Right Shoulder Extension 5/5    Right Shoulder ABduction 4/5    Right Shoulder Internal Rotation 4/5    Right Shoulder External Rotation 4/5      Palpation   Spinal mobility Rt GH limited posterior glide                         [facility] Adult PT Treatment/Exercise - 07/24/20 0001      Exercises   Exercises Shoulder      Shoulder Exercises: Supine   External Rotation Strengthening;Both;Theraband;15 reps    Theraband Level (Shoulder External Rotation) Level 1 (Yellow)    Flexion AAROM;Right;10 reps    Flexion Limitations PT guided ROM with VC for scapular and GH stab and mechanics for concentric/eccentric      Shoulder Exercises: Standing   Extension Strengthening;Both;Theraband    Theraband Level (Shoulder Extension) Level 1 (Yellow)    Extension Limitations 8 reps, scapular engagement first, then pull, maintain scapular retraction during eccentric phase, TC and VC    Row Strengthening;Both;Theraband    Theraband Level (Shoulder Row) Level 2 (Red)    Row Limitations 8 reps, scapular engagement first, then pull, maintain scapular retraction during eccentric phase, TC and VC      Modalities   Modalities Cryotherapy      Cryotherapy   Number Minutes Cryotherapy 10 Minutes    Cryotherapy Location Shoulder   Rt     Manual Therapy   Manual Therapy Soft tissue mobilization;Joint mobilization    Joint Mobilization Rt GH joint posterior and inf glides Gr II/III    Soft tissue mobilization cross friction massage to anterior RC tendons and bicep tendon    Passive ROM Rt shoulder mob with movement flexion                    PT Short Term Goals - 06/11/20 1409      PT SHORT TERM GOAL #1   Title be independent in initial HEP     Time 3    Period Weeks    Status Achieved    Target Date 06/13/20      PT SHORT TERM GOAL #3   Title Pt will be able  to demo improved posture in sitting and standing without discomfort with neck and scapular retraction    Time 4    Period Weeks    Status Achieved             PT Long Term Goals - 06/11/20 1434      PT LONG TERM GOAL #2   Title Pt will be able to transfer 2lb weight from counter to overhead shelf without pain and with good scapulohumeral rhythm.    Time 12    Period Weeks    Status Achieved                 Plan - 07/24/20 1705    Clinical Impression Statement Pt continues to have ongoing painful arc with Rt shoulder flexion and abduction.  Rt shoulder flexion is 165 with + painful arc and abd is 90 deg today limited by pain.  She is very tender on bicep tendon and supraspinatus tendons.  Strenght of Rt shoulder has improved, ranging from 4-/5 to 4+/5, with pain on resisted elbow flexion and shoulder IR.  Scapular control is improving.  She is able to perform supine AA/ROM with manual assistance and TC for [facility] mechanics.  She is able to tolerate scapular tband exercises but is quick to fatigue and gets sore in shoulder.  She reports approx 35% improvement with PT, mostly from being more mindful of posture and aggravating factors.  PT encouraged her to schedule the follow up with her MD as she is nearing the end of PT appointments.  Continue along POC with finalization of HEP over next two visits.    Comorbidities breast cancer with masectomy; osteoprosis    Rehab Potential Excellent    PT Frequency 1x / week    PT Duration 12 weeks    PT Treatment/Interventions ADLs/Self Care Home Management;Electrical Stimulation;Cryotherapy;Iontophoresis 4mg /ml Dexamethasone;Moist Heat;Traction;Neuromuscular re-education;Therapeutic exercise;Therapeutic activities;Functional mobility training;Patient/family education;Manual techniques;Spinal Manipulations;Joint  Manipulations;Passive range of motion;Dry needling;Taping    PT Next Visit Plan STM Rt shoulder, continue low rep quality of tband row, ext and supine horiz abd using red/yellow bands, decompression, finalize HEP    PT Home Exercise Plan Access Code: [email or web address]    Consulted and Agree with Plan of Care Patient           Patient will benefit from skilled therapeutic intervention in order to improve the following deficits and impairments:     Visit Diagnosis: Chronic right shoulder pain  Abnormal posture  Muscle weakness (generalized)     Problem List Patient Active Problem List   Diagnosis Date Noted  . Age-related osteoporosis without current pathological fracture 03/19/2020  . Personal history of colonic polyps 03/19/2020  . Transient cerebral ischemia 09/15/2017  . Pure hypercholesterolemia 09/15/2017  . Pulmonary nodule 09/15/2017  . Hypertension 09/15/2017  . Gastro-esophageal reflux disease without esophagitis 09/15/2017  . VAIN I (vaginal intraepithelial neoplasia grade I) 06/23/2017  . Genetic testing 05/23/2014  . History of breast cancer   . Family history of breast cancer   . Endometrial cancer ([place]) 03/21/2014  . Uterine cancer ([place]) 03/21/2014  . S/P mastectomy, bilateral 05/02/2013  . Situational stress 05/02/2013  . Osteoporosis, unspecified 05/02/2013  . Breast cancer, left breast ([place]) 05/02/2013    Ugochukwu Chichester E [place] 07/24/2020, 5:10 PM  [facility] [address], [place], [place], [address] Phone: [phone]   Fax:  [phone]  Name: Beth Simon MRN: [id number] Date of Birth: 1941-03-04

## 2020-07-25 DIAGNOSIS — I83893 Varicose veins of bilateral lower extremities with other complications: Secondary | ICD-10-CM | POA: Diagnosis not present

## 2020-07-25 DIAGNOSIS — R252 Cramp and spasm: Secondary | ICD-10-CM | POA: Diagnosis not present

## 2020-07-30 ENCOUNTER — Ambulatory Visit: Payer: Medicare HMO | Admitting: Podiatry

## 2020-07-31 ENCOUNTER — Ambulatory Visit: Payer: Medicare HMO | Attending: Orthopaedic Surgery | Admitting: Physical Therapy

## 2020-07-31 ENCOUNTER — Other Ambulatory Visit: Payer: Self-pay

## 2020-07-31 ENCOUNTER — Encounter: Payer: Self-pay | Admitting: Physical Therapy

## 2020-07-31 DIAGNOSIS — G8929 Other chronic pain: Secondary | ICD-10-CM | POA: Insufficient documentation

## 2020-07-31 DIAGNOSIS — M25511 Pain in right shoulder: Secondary | ICD-10-CM | POA: Insufficient documentation

## 2020-07-31 DIAGNOSIS — R293 Abnormal posture: Secondary | ICD-10-CM | POA: Insufficient documentation

## 2020-07-31 DIAGNOSIS — M6281 Muscle weakness (generalized): Secondary | ICD-10-CM | POA: Diagnosis not present

## 2020-07-31 NOTE — Therapy (Signed)
[facility] [address], [place], [place], [address] Phone: [phone]   Fax:  [phone]  Physical Therapy Treatment  Patient Details  Name: Beth Simon MRN: [id number] Date of Birth: 12/16/1940 Referring Provider (PT): Hiram Gash, MD   Encounter Date: 07/31/2020   PT End of Session - 07/31/20 1539    Visit Number 11    Date for PT Re-Evaluation 08/15/20    Authorization Type [employer] Medicare    PT Start Time 7902    PT Stop Time 1623    PT Time Calculation (min) 48 min    Activity Tolerance Patient tolerated treatment well;Patient limited by pain    Behavior During Therapy [facility] for tasks assessed/performed           Past Medical History:  Diagnosis Date  . Adenomatous polyps 01/2016  . Anemia   . Breast cancer ([place]) 1990   bilateral mastectomy, adenoca breast-left MRM, reconstruction, chemo  . Bronchitis last 2 weeks   saw dr Felipa Eth 02-28-2014, he said no antibiotics needed, nonproductive cough  . Complication of anesthesia   . Cystitis    cytoxen, had once or twice  . Family history of breast cancer   . History of breast cancer   . History of radiation therapy 2/10, 2/12, 2/18, 2/24, 05/29/14   vaginal cuff/ 30 Gy/ 5 fx  . Hypertension   . Macular degeneration   . Neck pain    taking physictal therapy last 2 weeks  . Osteoporosis    On Prolia.  Marland Kitchen PONV (postoperative nausea and vomiting)   . Shoulder pain    taking physical therapy for last few weeks  . Uterine cancer ([place]) 03/21/2014   MLH1/PMS2 LOH  . Uterine fibroid   . VAIN I (vaginal intraepithelial neoplasia grade I) 2018   positive HR HPV.  . Vasovagal syncopes     Past Surgical History:  Procedure Laterality Date  . BREAST IMPLANT REMOVAL  6/94   left breast  . BREAST RECONSTRUCTION  1990   left  . CATARACT EXTRACTION Bilateral   . EXCISION VAGINAL CYST    . MASTECTOMY Right 10/08/06   prophylactic  . RADICAL MASTECTOMY  LND  1990   left, chemo done  . ROBOTIC ASSISTED TOTAL HYSTERECTOMY WITH BILATERAL SALPINGO OOPHERECTOMY Bilateral 03/21/2014   Procedure: ROBOTIC ASSISTED TOTAL HYSTERECTOMY WITH BILATERAL SALPINGO OOPHORECTOMY ;  Surgeon: Janie Morning, MD;  Location: [facility];  Service: Gynecology;  Laterality: Bilateral;    There were no vitals filed for this visit.   Subjective Assessment - 07/31/20 1536    Subjective I'm about the same.  It depends on how I wake up to see how it feels.  My Rt shoulder doesn't ache all the time anymore but it can catch and reach a brief 8/10.  I was able to use both arms to help with lifting tasks without pain but nothing reached overhead, just to shoulder height.    Pertinent History Rt shoulder cortisone injection 2 weeks ago with signif relief    Diagnostic tests xray Rt shoulder: good GH joint space, mild/mod AC joint arthritis    Patient Stated Goals continue to have relief and prevent return of pain    Currently in Pain? No/denies   reaches 8/10 when it catches   Pain Location Shoulder    Pain Orientation Right    Pain Descriptors / Indicators Sharp    Pain Type Acute pain    Pain Frequency Intermittent  Aggravating Factors  sleeping wrong or reaching    Pain Relieving Factors stretching, being more aware of posture                             [facility] Adult PT Treatment/Exercise - 07/31/20 0001      Exercises   Exercises Neck      Neck Exercises: Seated   Cervical Rotation Both    Cervical Rotation Limitations 3 reps in retraction, VC to keep shoulders back/down    Shoulder Rolls 5 reps;Backwards;Forwards    Other Seated Exercise neck circles painfree range x 5 each way      Shoulder Exercises: Seated   Other Seated Exercises thoracic extension over chair x 5 reps seated in chair + black pad, arms crossed over chest to avoid ER of shoulder due to pain      Shoulder Exercises: Standing   Extension Strengthening;Both;10 reps;Theraband     Theraband Level (Shoulder Extension) Level 1 (Yellow)    Row Strengthening;Both;Theraband;20 reps    Theraband Level (Shoulder Row) Level 1 (Yellow);Level 2 (Red)    Other Standing Exercises corner stretch x 20 sec      Shoulder Exercises: Pulleys   Flexion 3 minutes    Flexion Limitations initially mostly Rt passive, slowly cued more AA/ROM Rt with scapular stab awareness with mid-range ROM      Shoulder Exercises: ROM/Strengthening   Proximal Shoulder Strengthening, Supine circles at 90 deg 10x each way    Other ROM/Strengthening Exercises finger ladder, Rt, 5 reps in scapular plane, to level 17      Cryotherapy   Number Minutes Cryotherapy 10 Minutes    Cryotherapy Location Shoulder   supine   Type of Cryotherapy Ice massage      Manual Therapy   Manual Therapy Joint mobilization;Soft tissue mobilization;Passive ROM    Joint Mobilization Rt GH joint posterior glide Gr II/III, mob with movement inf glide with P/ROM flexion    Soft tissue mobilization cross friction massage to anterior RC tendons and bicep tendon    Passive ROM flexion, Rt shoulder                    PT Short Term Goals - 06/11/20 1409      PT SHORT TERM GOAL #1   Title be independent in initial HEP    Time 3    Period Weeks    Status Achieved    Target Date 06/13/20      PT SHORT TERM GOAL #3   Title Pt will be able to demo improved posture in sitting and standing without discomfort with neck and scapular retraction    Time 4    Period Weeks    Status Achieved             PT Long Term Goals - 06/11/20 1434      PT LONG TERM GOAL #2   Title Pt will be able to transfer 2lb weight from counter to overhead shelf without pain and with good scapulohumeral rhythm.    Time 12    Period Weeks    Status Achieved                 Plan - 07/31/20 1617    Clinical Impression Statement Pt is able to achieve painfree AA/ROM using pulleys and PT assist with VC for scapular control.  A/ROM  remains painful with flexion and abduction within painful arc.  Pt reports improved pain from being constant to sharp brief pains with certain motions within painful arc.  Pain reaches 8/10 when it "catches" and doesn't last.  PT reviewed postural mobility and stretching for release of cervical, chest and shoulder/upper back tension today along with VC/TC scapular cueing with band exercises.  Ice used end of session.  D/C likely next visit as Pt is reaching plateau in progress.    Comorbidities breast cancer with masectomy; osteoprosis    PT Frequency 1x / week    PT Duration 12 weeks    PT Treatment/Interventions ADLs/Self Care Home Management;Electrical Stimulation;Cryotherapy;Iontophoresis 4mg /ml Dexamethasone;Moist Heat;Traction;Neuromuscular re-education;Therapeutic exercise;Therapeutic activities;Functional mobility training;Patient/family education;Manual techniques;Spinal Manipulations;Joint Manipulations;Passive range of motion;Dry needling;Taping    PT Next Visit Plan review HEP and d/c next time    PT Home Exercise Plan Access Code: [email or web address]    Consulted and Agree with Plan of Care Patient           Patient will benefit from skilled therapeutic intervention in order to improve the following deficits and impairments:     Visit Diagnosis: Chronic right shoulder pain  Abnormal posture  Muscle weakness (generalized)     Problem List Patient Active Problem List   Diagnosis Date Noted  . Age-related osteoporosis without current pathological fracture 03/19/2020  . Personal history of colonic polyps 03/19/2020  . Transient cerebral ischemia 09/15/2017  . Pure hypercholesterolemia 09/15/2017  . Pulmonary nodule 09/15/2017  . Hypertension 09/15/2017  . Gastro-esophageal reflux disease without esophagitis 09/15/2017  . VAIN I (vaginal intraepithelial neoplasia grade I) 06/23/2017  . Genetic testing 05/23/2014  . History of breast cancer   . Family history of breast cancer   .  Endometrial cancer ([place]) 03/21/2014  . Uterine cancer ([place]) 03/21/2014  . S/P mastectomy, bilateral 05/02/2013  . Situational stress 05/02/2013  . Osteoporosis, unspecified 05/02/2013  . Breast cancer, left breast ([place]) 05/02/2013    Mykaylah Ballman E Tenise Stetler 07/31/2020, 4:19 PM  [facility] [address], [place], [place], [address] Phone: [phone]   Fax:  [phone]  Name: MAYMIE BRUNKE MRN: [id number] Date of Birth: 1940-05-31

## 2020-08-06 ENCOUNTER — Other Ambulatory Visit: Payer: Self-pay

## 2020-08-06 ENCOUNTER — Ambulatory Visit: Payer: Medicare HMO | Admitting: Podiatry

## 2020-08-06 DIAGNOSIS — Q828 Other specified congenital malformations of skin: Secondary | ICD-10-CM

## 2020-08-06 DIAGNOSIS — M79676 Pain in unspecified toe(s): Secondary | ICD-10-CM | POA: Diagnosis not present

## 2020-08-06 DIAGNOSIS — B351 Tinea unguium: Secondary | ICD-10-CM

## 2020-08-07 ENCOUNTER — Ambulatory Visit: Payer: Medicare HMO | Admitting: Physical Therapy

## 2020-08-07 ENCOUNTER — Encounter: Payer: Self-pay | Admitting: Physical Therapy

## 2020-08-07 DIAGNOSIS — G8929 Other chronic pain: Secondary | ICD-10-CM

## 2020-08-07 DIAGNOSIS — M6281 Muscle weakness (generalized): Secondary | ICD-10-CM

## 2020-08-07 DIAGNOSIS — R293 Abnormal posture: Secondary | ICD-10-CM | POA: Diagnosis not present

## 2020-08-07 DIAGNOSIS — M25511 Pain in right shoulder: Secondary | ICD-10-CM | POA: Diagnosis not present

## 2020-08-07 NOTE — Patient Instructions (Signed)
Access Code: [email or web address] URL: [email or web address] Date: 08/07/2020 Prepared by: Venetia Night Dalayna Lauter  Exercises [place] Major Stretch - 1 x daily - 7 x weekly - 3 sets - 10 reps Supine Shoulder Flexion Extension AAROM with Dowel - 1 x daily - 7 x weekly - 1 sets - 10 reps Supine Cervical Retraction with Towel - 1 x daily - 7 x weekly - 1 sets - 10 reps - 5 hold Supine Single Arm Shoulder Protraction - 1 x daily - 7 x weekly - 2 sets - 10 reps Supine Shoulder Circles - 1 x daily - 7 x weekly - 2 sets - 10 reps Standing Row with Anchored Resistance - 1 x daily - 7 x weekly - 2 sets - 10 reps Single Arm Shoulder Extension with Anchored Resistance - 1 x daily - 7 x weekly - 2 sets - 10 reps Supine Shoulder Horizontal Abduction with Resistance - 1 x daily - 7 x weekly - 3 sets - 10 reps Supine Shoulder External Rotation with Resistance - 1 x daily - 7 x weekly - 3 sets - 10 reps Supine Shoulder External Rotation on Foam Roll with Theraband - 1 x daily - 7 x weekly - 2 sets - 10 reps

## 2020-08-07 NOTE — Therapy (Signed)
[facility] [address], [place], [place], [address] Phone: [phone]   Fax:  [phone]  Physical Therapy Treatment  Patient Details  Name: Beth Simon MRN: [id number] Date of Birth: 1940-12-09 Referring Provider (PT): Hiram Gash, MD   Encounter Date: 08/07/2020   PT End of Session - 08/07/20 1534    Visit Number 12    Date for PT Re-Evaluation 08/15/20    Authorization Type Aetna Medicare    PT Start Time 2831    PT Stop Time 1617    PT Time Calculation (min) 42 min    Activity Tolerance Patient tolerated treatment well;Patient limited by pain    Behavior During Therapy [facility] for tasks assessed/performed           Past Medical History:  Diagnosis Date  . Adenomatous polyps 01/2016  . Anemia   . Breast cancer ([place]) 1990   bilateral mastectomy, adenoca breast-left MRM, reconstruction, chemo  . Bronchitis last 2 weeks   saw dr Felipa Eth 02-28-2014, he said no antibiotics needed, nonproductive cough  . Complication of anesthesia   . Cystitis    cytoxen, had once or twice  . Family history of breast cancer   . History of breast cancer   . History of radiation therapy 2/10, 2/12, 2/18, 2/24, 05/29/14   vaginal cuff/ 30 Gy/ 5 fx  . Hypertension   . Macular degeneration   . Neck pain    taking physictal therapy last 2 weeks  . Osteoporosis    On Prolia.  Marland Kitchen PONV (postoperative nausea and vomiting)   . Shoulder pain    taking physical therapy for last few weeks  . Uterine cancer ([place]) 03/21/2014   MLH1/PMS2 LOH  . Uterine fibroid   . VAIN I (vaginal intraepithelial neoplasia grade I) 2018   positive HR HPV.  . Vasovagal syncopes     Past Surgical History:  Procedure Laterality Date  . BREAST IMPLANT REMOVAL  6/94   left breast  . BREAST RECONSTRUCTION  1990   left  . CATARACT EXTRACTION Bilateral   . EXCISION VAGINAL CYST    . MASTECTOMY Right 10/08/06   prophylactic  . RADICAL MASTECTOMY  LND  1990   left, chemo done  . ROBOTIC ASSISTED TOTAL HYSTERECTOMY WITH BILATERAL SALPINGO OOPHERECTOMY Bilateral 03/21/2014   Procedure: ROBOTIC ASSISTED TOTAL HYSTERECTOMY WITH BILATERAL SALPINGO OOPHORECTOMY ;  Surgeon: Janie Morning, MD;  Location: [facility];  Service: Gynecology;  Laterality: Bilateral;    There were no vitals filed for this visit.   Subjective Assessment - 08/07/20 1538    Subjective I have an ache on the side of the Rt shoulder.  I can get occassional sharp pains but not as sharp this week as previously with overhead reaching or moving just the wrong way.  I feel improved overall especially being more mindful of my posture.  My best move is to keep my shoulders back.    Pertinent History Rt shoulder cortisone injection 2 weeks ago with signif relief    Diagnostic tests xray Rt shoulder: good GH joint space, mild/mod AC joint arthritis    Patient Stated Goals continue to have relief and prevent return of pain    Currently in Pain? Yes    Pain Score 2     Pain Location Shoulder    Pain Orientation Right    Pain Type Chronic pain    Pain Onset More than a month ago    Pain Frequency  Intermittent    Aggravating Factors  sleeping wrong or reaching    Pain Relieving Factors stretch, being more aware of posture              [facility] PT Assessment - 08/07/20 0001      Assessment   Medical Diagnosis M75.81 (ICD-10-CM) - Right rotator cuff tendonitis    Referring Provider (PT) Hiram Gash, MD    Onset Date/Surgical Date --   Nov 2021   Hand Dominance Right    Next MD Visit as needed, MD will call to check on her    Prior Therapy for pelvic floor      Observation/Other Assessments   Focus on Therapeutic Outcomes (FOTO)  regressed to 55%      Posture/Postural Control   Postural Limitations Increased thoracic kyphosis;Forward head;Rounded Shoulders      AROM   Right Shoulder Extension 45 Degrees    Right Shoulder Flexion 165 Degrees   signif painful arc   Right  Shoulder ABduction 90 Degrees   pain limits     Strength   Right Shoulder Flexion 4+/5    Right Shoulder Extension 5/5    Right Shoulder ABduction 4/5    Right Shoulder Internal Rotation 4/5    Right Shoulder External Rotation 4/5      Palpation   Spinal mobility Rt GH limited posterior glide                         [facility] Adult PT Treatment/Exercise - 08/07/20 0001      Self-Care   Self-Care Other Self-Care Comments    Other Self-Care Comments  review of spinal decompression      Exercises   Exercises Neck;Shoulder      Shoulder Exercises: Supine   Protraction AROM;Right;10 reps    External Rotation Strengthening;Both;15 reps;Theraband    Theraband Level (Shoulder External Rotation) Level 1 (Yellow)    Flexion AAROM;Both;10 reps    Flexion Limitations chest press to overhead arms      Shoulder Exercises: Standing   Extension Strengthening;Both;Theraband;15 reps    Theraband Level (Shoulder Extension) Level 1 (Yellow)    Row Strengthening;Both;20 reps;Theraband    Theraband Level (Shoulder Row) Level 2 (Red)    Other Standing Exercises corner stretch x 20 sec      Shoulder Exercises: Pulleys   Flexion 3 minutes      Shoulder Exercises: ROM/Strengthening   Proximal Shoulder Strengthening, Supine circles at 90 deg 10x each way    Other ROM/Strengthening Exercises finger ladder, Rt, 5 reps in scapular plane, to level 17   less pain with this than previous visits     Manual Therapy   Soft tissue mobilization Rt GH joint soft tissues in sitting, Rt pectorals                  PT Education - 08/07/20 1711    Education Details final revision of HEP, hard copy given to Pt, reminder and review of spinal decompression    Person(s) Educated Patient    Methods Explanation;Demonstration;Handout    Comprehension Verbalized understanding;Returned demonstration            PT Short Term Goals - 06/11/20 1409      PT SHORT TERM GOAL #1   Title be  independent in initial HEP    Time 3    Period Weeks    Status Achieved    Target Date 06/13/20  PT SHORT TERM GOAL #3   Title Pt will be able to demo improved posture in sitting and standing without discomfort with neck and scapular retraction    Time 4    Period Weeks    Status Achieved             PT Long Term Goals - 08/07/20 1719      PT LONG TERM GOAL #1   Title be independent in advanced HEP    Status Achieved      PT LONG TERM GOAL #2   Title Pt will be able to transfer 2lb weight from counter to overhead shelf without pain and with good scapulohumeral rhythm.    Status Achieved      PT LONG TERM GOAL #3   Title Pt will report tolerance of light household tasks requiring overhead UE use with min or no pain such as laundry and emptying dishwasher    Baseline ongoing painful arc with overhead reaching    Status Not Met      PT LONG TERM GOAL #4   Title Improve FOTO score from 65% to at least 67% to demo improved function.    Baseline 55%, regressed by 10 points    Status Not Met      PT LONG TERM GOAL #5   Title Improved strength of bil shoulders and elbows to at least 4+/5 for improved strength for shopping and household tasks.    Baseline 4/5 to 4+/5    Status Partially Met                 Plan - 08/07/20 1713    Clinical Impression Statement Pt reports improved pain this week (1-2/10) compared to previous weeks.  She has met or partially met most goals.  She reports 35% improvement in pain and function of Rt shoulder with PT.  She continues to have intermittent Rt shoulder pain with overhead reaching in pattern of painful arc.  A/ROM is [facility] with + painful arc.  She is able to achieve full painfree AA/ROM into elevation.  Strength of Rt shoulder is 4/5 to 4+/5.  She is more mindful and aware of optimal posture which helps her Rt shoulder pain.  PT finalized and reviewed HEP and revisited spinal decompression from earlier visits.  FOTO score  unfortunately regressed by 10 points, which is potentially explained by Pt admitting she is careful to avoid aggravating activities.  Pt plans to f/u with MD later in the week.    Comorbidities breast cancer with masectomy; osteoprosis    PT Frequency 1x / week    PT Duration 12 weeks    PT Treatment/Interventions ADLs/Self Care Home Management;Electrical Stimulation;Cryotherapy;Iontophoresis 4mg /ml Dexamethasone;Moist Heat;Traction;Neuromuscular re-education;Therapeutic exercise;Therapeutic activities;Functional mobility training;Patient/family education;Manual techniques;Spinal Manipulations;Joint Manipulations;Passive range of motion;Dry needling;Taping    PT Next Visit Plan d/c to HEP    PT Home Exercise Plan Access Code: [email or web address]    Consulted and Agree with Plan of Care Patient           Patient will benefit from skilled therapeutic intervention in order to improve the following deficits and impairments:     Visit Diagnosis: Chronic right shoulder pain  Abnormal posture  Muscle weakness (generalized)     Problem List Patient Active Problem List   Diagnosis Date Noted  . Age-related osteoporosis without current pathological fracture 03/19/2020  . Personal history of colonic polyps 03/19/2020  . Transient cerebral ischemia 09/15/2017  . Pure hypercholesterolemia 09/15/2017  . Pulmonary nodule 09/15/2017  .  Hypertension 09/15/2017  . Gastro-esophageal reflux disease without esophagitis 09/15/2017  . VAIN I (vaginal intraepithelial neoplasia grade I) 06/23/2017  . Genetic testing 05/23/2014  . History of breast cancer   . Family history of breast cancer   . Endometrial cancer ([place]) 03/21/2014  . Uterine cancer ([place]) 03/21/2014  . S/P mastectomy, bilateral 05/02/2013  . Situational stress 05/02/2013  . Osteoporosis, unspecified 05/02/2013  . Breast cancer, left breast ([place]) 05/02/2013    PHYSICAL THERAPY DISCHARGE SUMMARY  Visits from Start of Care: 12  Current  functional level related to goals / functional outcomes: See above.   Remaining deficits: See above   Education / Equipment: HEP and f/u with MD Plan: Patient agrees to discharge.  Patient goals were partially met. Patient is being discharged due to meeting the stated rehab goals.  ?????          Baruch Merl, PT 08/07/20 5:23 PM   [facility] [address] [place], [place], [address] Phone: [phone]   Fax:  [phone]  Name: Beth Simon MRN: [id number] Date of Birth: 03/05/1941

## 2020-08-08 NOTE — Progress Notes (Signed)
Subjective: 80 y.o. returns the office today for painful, elongated, thickened toenails which she is unable to trim herself as well as for calluses.  Denies any redness or drainage or any swelling or any open sores.  No swelling or redness.  She has been getting cramping in her legs intermittently.  She has no other concerns today.  Objective: AAO 3, NAD DP/PT pulses palpable, CRT less than 3 seconds Nails mildly hypertrophic, dystrophic, elongated, brittle, discolored 10. There is tenderness overlying the nails 1-5 bilaterally. There is no surrounding erythema or drainage along the nail sites.  On the right second nail there was an ingrown toenail present and upon debridement small debridement.  I was able to clean this.  There is no signs of infection noted today. Hyperkeratotic lesions of the distal aspect left second digit as well as the dorsal lateral aspect of the fifth digit left foot. No underlying ulceration, drainage or other signs of infection.  No open lesions or other pre-ulcerative lesions are identified. No pain with calf compression, swelling, warmth, erythema. Overall, no changes   Assessment: Symptomatic onychomycosis; hyperkeratotic lesion left foot  Plan: -Treatment options including alternatives, risks, complications were discussed -Nails sharply debrided 10.  Antibiotic ointment and a bandage applied to the 1 toe that had bleeding.  Monitoring signs or symptoms of infection. -Hyperkeratotic lesion sharply debrided 2 without complication/bleeding. -Follow-up in 4 weeks at her request or sooner if any problems are to arise. In the meantime, encouraged to call the office with any questions, concerns, changes symptoms.  Celesta Gentile, DPM

## 2020-08-10 DIAGNOSIS — M25511 Pain in right shoulder: Secondary | ICD-10-CM | POA: Diagnosis not present

## 2020-08-28 ENCOUNTER — Other Ambulatory Visit: Payer: Self-pay

## 2020-08-28 ENCOUNTER — Other Ambulatory Visit (HOSPITAL_COMMUNITY)
Admission: RE | Admit: 2020-08-28 | Discharge: 2020-08-28 | Disposition: A | Discharge status: Home / Self Care | Payer: Medicare HMO | Source: Ambulatory Visit | Attending: Obstetrics and Gynecology | Admitting: Obstetrics and Gynecology

## 2020-08-28 ENCOUNTER — Ambulatory Visit (INDEPENDENT_AMBULATORY_CARE_PROVIDER_SITE_OTHER): Payer: Medicare HMO | Admitting: Obstetrics and Gynecology

## 2020-08-28 ENCOUNTER — Encounter: Payer: Self-pay | Admitting: Obstetrics and Gynecology

## 2020-08-28 VITALS — BP 140/68 | HR 78 | Ht 59.0 in | Wt 135.0 lb

## 2020-08-28 DIAGNOSIS — Z9189 Other specified personal risk factors, not elsewhere classified: Secondary | ICD-10-CM | POA: Diagnosis not present

## 2020-08-28 DIAGNOSIS — Z1151 Encounter for screening for human papillomavirus (HPV): Secondary | ICD-10-CM | POA: Insufficient documentation

## 2020-08-28 DIAGNOSIS — Z8542 Personal history of malignant neoplasm of other parts of uterus: Secondary | ICD-10-CM | POA: Diagnosis present

## 2020-08-28 DIAGNOSIS — Z1239 Encounter for other screening for malignant neoplasm of breast: Secondary | ICD-10-CM

## 2020-08-28 DIAGNOSIS — Z01411 Encounter for gynecological examination (general) (routine) with abnormal findings: Secondary | ICD-10-CM | POA: Diagnosis not present

## 2020-08-28 DIAGNOSIS — E663 Overweight: Secondary | ICD-10-CM

## 2020-08-28 DIAGNOSIS — R69 Illness, unspecified: Secondary | ICD-10-CM | POA: Diagnosis not present

## 2020-08-28 DIAGNOSIS — Z87411 Personal history of vaginal dysplasia: Secondary | ICD-10-CM | POA: Diagnosis present

## 2020-08-28 DIAGNOSIS — Z1272 Encounter for screening for malignant neoplasm of vagina: Secondary | ICD-10-CM | POA: Diagnosis not present

## 2020-08-28 DIAGNOSIS — Z853 Personal history of malignant neoplasm of breast: Secondary | ICD-10-CM

## 2020-08-28 DIAGNOSIS — N895 Stricture and atresia of vagina: Secondary | ICD-10-CM

## 2020-08-28 MED ORDER — NONFORMULARY OR COMPOUNDED ITEM: 1 refills | Status: DC

## 2020-08-28 NOTE — Patient Instructions (Signed)

## 2020-08-28 NOTE — Progress Notes (Signed)
80 y.o. G60P1001 Widowed Caucasian female here for Annual Exam.    Feeling fatigue for a long time.  No trouble falling asleep and staying asleep.   Thinks she needs to increase her activity.  Likes living alone.  Son lives in [place].    She has seen her PCP about her fatigue and had normal labs.  Just did a lifeline screening, which was good.   Had myofascial release in her chest wall and pelvic floor therapy for her bladder and bowel incontinence.  If she does her exercises, her control of bladder and bowel is improved.   She would like to loose weight.  She has not filled her Rx for prosthetic bras.  Denies vaginal bleeding, discharge, pelvic pain, or blood in urine or stool. Uses her vaginal dilator about every 6 weeks.   PCP: Lajean Manes, MD  Patient's last menstrual period was 03/31/1988 (approximate).           Sexually active: No.  The current method of family planning is status post hysterectomy--Hx of uterine cancer.    Exercising: No.  walks 1 day weekly and pilates 1 day weekly Smoker:  no  Health Maintenance: Pap:  08-24-19 Neg:Neg HR HPV, 08-20-18 Neg:Neg HR HPV, 06-03-17 Neg:Neg HR HPV History of abnormal Pap:  Yes, 05-16-16 Neg:Pos HR HPV with vaginal biopsies from colpo showing VAIN I MMG: Bilateral mastectomy Colonoscopy: 2017 polyps--no further studies needed. BMD: 05-07-20  Result :Osteopenia on Prolia--suspended due to oral surgery TDaP:  08-20-18 Gardasil:   no HIV: donated blood in past Hep C:donated blood in past Screening Labs:  PCP.    reports that she has never smoked. She has never used smokeless tobacco. She reports that she does not drink alcohol and does not use drugs.  Past Medical History:  Diagnosis Date  . Adenomatous polyps 01/2016  . Anemia   . Breast cancer ([place]) 1990   bilateral mastectomy, adenoca breast-left MRM, reconstruction, chemo  . Bronchitis last 2 weeks   saw dr Felipa Eth 02-28-2014, he said no antibiotics needed,  nonproductive cough  . Complication of anesthesia   . Cystitis    cytoxen, had once or twice  . Family history of breast cancer   . History of breast cancer   . History of radiation therapy 2/10, 2/12, 2/18, 2/24, 05/29/14   vaginal cuff/ 30 Gy/ 5 fx  . Hypertension   . Macular degeneration   . Neck pain    taking physictal therapy last 2 weeks  . Osteoporosis    On Prolia.  Marland Kitchen PONV (postoperative nausea and vomiting)   . Shoulder pain    taking physical therapy for last few weeks  . Uterine cancer ([place]) 03/21/2014   MLH1/PMS2 LOH  . Uterine fibroid   . VAIN I (vaginal intraepithelial neoplasia grade I) 2018   positive HR HPV.  . Vasovagal syncopes     Past Surgical History:  Procedure Laterality Date  . BREAST IMPLANT REMOVAL  6/94   left breast  . BREAST RECONSTRUCTION  1990   left  . CATARACT EXTRACTION Bilateral   . EXCISION VAGINAL CYST    . MASTECTOMY Right 10/08/06   prophylactic  . RADICAL MASTECTOMY LND  1990   left, chemo done  . ROBOTIC ASSISTED TOTAL HYSTERECTOMY WITH BILATERAL SALPINGO OOPHERECTOMY Bilateral 03/21/2014   Procedure: ROBOTIC ASSISTED TOTAL HYSTERECTOMY WITH BILATERAL SALPINGO OOPHORECTOMY ;  Surgeon: Janie Morning, MD;  Location: [facility];  Service: Gynecology;  Laterality: Bilateral;    Current  Outpatient Medications  Medication Sig Dispense Refill  . atorvastatin (LIPITOR) 10 MG tablet Take 10 mg by mouth daily.    . Coenzyme Q10 (COQ10 PO) Take 1 tablet by mouth every morning.     . Cyanocobalamin (VITAMIN B 12 PO) Take 1 tablet by mouth every morning.     . denosumab (PROLIA) 60 MG/ML SOLN injection Inject 60 mg into the skin every 6 (six) months. Administer in upper arm, thigh, or abdomen    . famotidine (PEPCID) 40 MG tablet Take 40 mg by mouth at bedtime as needed.    . Flaxseed, Linseed, (FLAX SEEDS PO) Take 1 scoop by mouth every morning. Ground flaxseed in cereal.    . lisinopril (PRINIVIL,ZESTRIL) 5 MG tablet Take 5 mg by mouth  daily.    . Multiple Vitamins-Minerals (PRESERVISION AREDS) CAPS See admin instructions.    Marland Kitchen OVER THE COUNTER MEDICATION Take 1 tablet by mouth every morning. Astazanthin. (anti-oxidant)    . SHINGRIX injection     . VITAMIN D, CHOLECALCIFEROL, PO Take 4,000 Int'l Units by mouth every morning.      No current facility-administered medications for this visit.    Family History  Problem Relation Age of Onset  . Breast cancer Sister 52       recurrence age 29  . Breast cancer Paternal Grandmother   . Heart disease Mother   . Heart disease Father   . Hypertension Sister   . Dementia Sister   . Breast cancer Maternal Aunt 68  . Dementia Maternal Aunt        dx in her 77s    Review of Systems  All other systems reviewed and are negative.   Exam:   BP 140/68   Pulse 78   Ht $R'4\' 11"'pB$  (1.499 m)   Wt 135 lb (61.2 kg)   LMP 03/31/1988 (Approximate)   SpO2 98%   BMI 27.27 kg/m     General appearance: alert, cooperative and appears stated age Head: normocephalic, without obvious abnormality, atraumatic Neck: no adenopathy, supple, symmetrical, trachea midline and thyroid normal to inspection and palpation Lungs: clear to auscultation bilaterally Breasts:  Absent.  Scars noted.  No dominant masses.  Heart: regular rate and rhythm Abdomen: soft, non-tender; no masses, no organomegaly Extremities: extremities normal, atraumatic, no cyanosis or edema Skin: skin color, texture, turgor normal. No rashes or lesions Lymph nodes: cervical, supraclavicular, and axillary nodes normal. Neurologic: grossly normal  Pelvic: External genitalia:  no lesions              No abnormal inguinal nodes palpated.              Urethra:  normal appearing urethra with no masses, tenderness or lesions              Bartholins and Skenes: normal                 Vagina: normal appearing vagina with normal color and discharge, no lesions.  Pediatric speculum used.               Cervix: absent               Pap taken: Yes.   Bimanual Exam:  Uterus:  absent              Adnexa: no mass, fullness, tenderness              Rectal exam: Yes.    Confirms.  Anus:  normal sphincter tone, no lesions  Chaperone was present for exam.  Assessment:    Status post robotic hysterectomy with BSO.  Endometrioid uterine cancer, FIGO grade II.  Status post vaginal brachytherapy. Vaginal stenosis.  Pap smear 05/16/16 - negative and positive HR HPV.  Colposcopy with vaginal biopsy 06/02/16 - VAIN I.  Urinary incontinence. Fecal soiling. Chronic.  Status post bilateral mastectomy for breast cancer.  Screening breast exam today. Osteoporosis. Off Prolia. Fatigue.   Plan:  Self chest wall checks encouraged.  Rx for prosthetic bras.  Pap and reflex HR HPV as above. Continue periodic dilator use.  Guidelines for Calcium, Vitamin D, regular exercise program including cardiovascular and weight bearing exercise. We discussed potentially joining the [facility] for fitness, weight loss counseling, and social interaction.  BMD in 2 years.  She will check with her dental team regarding potentially restarting Prolia.  Follow up annually and prn.   30 min  total time was spent for this patient encounter, including preparation, face-to-face counseling with the patient, coordination of care, and documentation of the encounter.

## 2020-08-30 LAB — CYTOLOGY - PAP
Comment: NEGATIVE
Diagnosis: NEGATIVE
High risk HPV: NEGATIVE

## 2020-09-03 ENCOUNTER — Ambulatory Visit: Payer: Medicare HMO | Admitting: Podiatry

## 2020-09-17 ENCOUNTER — Other Ambulatory Visit: Payer: Self-pay

## 2020-09-17 ENCOUNTER — Ambulatory Visit: Payer: Medicare HMO | Admitting: Podiatry

## 2020-09-17 DIAGNOSIS — Q828 Other specified congenital malformations of skin: Secondary | ICD-10-CM

## 2020-09-17 DIAGNOSIS — B351 Tinea unguium: Secondary | ICD-10-CM

## 2020-09-17 DIAGNOSIS — M79676 Pain in unspecified toe(s): Secondary | ICD-10-CM | POA: Diagnosis not present

## 2020-09-21 NOTE — Progress Notes (Signed)
Subjective: 80 y.o. returns the office today for painful, elongated, thickened toenails which she is unable to trim herself as well as for calluses.  Denies any redness or drainage or any swelling or any open sores.  She is now at the nails cut too short as they were tender afterwards.  Denies any swelling or redness or any drainage.  Objective: AAO 3, NAD DP/PT pulses palpable, CRT less than 3 seconds Nails mildly hypertrophic, dystrophic, elongated, brittle, discolored 10. There is tenderness overlying the nails 1-5 bilaterally. There is no surrounding erythema or drainage along the nail sites.  Including present to multiple lesser digit nails and there is no edema, erythema or signs of infection. Hyperkeratotic lesions of the distal aspect left second digit as well as the dorsal lateral aspect of the fifth digit left foot. No underlying ulceration, drainage or other signs of infection.  No open lesions or other pre-ulcerative lesions are identified. No pain with calf compression, swelling, warmth, erythema. Overall, no changes   Assessment: Symptomatic onychomycosis; hyperkeratotic lesion left foot  Plan: -Treatment options including alternatives, risks, complications were discussed -Nails sharply debrided 10.  -Monitor of ingrown toenails for any signs or symptoms of infection or increased pain. -Hyperkeratotic lesion sharply debrided 2 without complication/bleeding. -Follow-up in 4 weeks at her request or sooner if any problems are to arise. In the meantime, encouraged to call the office with any questions, concerns, changes symptoms.  Celesta Gentile, DPM

## 2020-10-16 ENCOUNTER — Other Ambulatory Visit: Payer: Self-pay

## 2020-10-16 ENCOUNTER — Encounter: Payer: Self-pay | Admitting: Podiatry

## 2020-10-16 ENCOUNTER — Ambulatory Visit: Payer: Medicare HMO | Admitting: Podiatry

## 2020-10-16 DIAGNOSIS — B351 Tinea unguium: Secondary | ICD-10-CM

## 2020-10-16 DIAGNOSIS — Q828 Other specified congenital malformations of skin: Secondary | ICD-10-CM | POA: Diagnosis not present

## 2020-10-16 DIAGNOSIS — M79676 Pain in unspecified toe(s): Secondary | ICD-10-CM

## 2020-10-20 NOTE — Progress Notes (Signed)
Subjective: 80 y.o. returns the office today for painful, elongated, thickened toenails which she is unable to trim herself as well as for calluses.  Denies any redness or drainage or any swelling or any open sores.  No tenderness of the lower extremity.  Denies any swelling or redness or any drainage.  Objective: AAO 3, NAD DP/PT pulses palpable, CRT less than 3 seconds Nails mildly hypertrophic, dystrophic, elongated, brittle, discolored 10. There is tenderness overlying the nails 1-5 bilaterally. There is no surrounding erythema or drainage along the nail sites.  Including present to multiple lesser digit nails and there is no edema, erythema or signs of infection. Hyperkeratotic lesions of the distal aspect left second digit as well as the dorsal lateral aspect of the fifth digit left foot. No underlying ulceration, drainage or other signs of infection.  No pain with calf compression, swelling, warmth, erythema.  Assessment: Symptomatic onychomycosis; hyperkeratotic lesion left foot  Plan: -Treatment options including alternatives, risks, complications were discussed -Nails sharply debrided 10.  -Monitor of ingrown toenails for any signs or symptoms of infection or increased pain. -Hyperkeratotic lesion sharply debrided 2 without complication/bleeding. -Follow-up in 4 weeks at her request or sooner if any problems are to arise. In the meantime, encouraged to call the office with any questions, concerns, changes symptoms.  Celesta Gentile, DPM

## 2020-10-23 DIAGNOSIS — D2239 Melanocytic nevi of other parts of face: Secondary | ICD-10-CM | POA: Diagnosis not present

## 2020-10-23 DIAGNOSIS — I8392 Asymptomatic varicose veins of left lower extremity: Secondary | ICD-10-CM | POA: Diagnosis not present

## 2020-10-23 DIAGNOSIS — L570 Actinic keratosis: Secondary | ICD-10-CM | POA: Diagnosis not present

## 2020-10-23 DIAGNOSIS — D225 Melanocytic nevi of trunk: Secondary | ICD-10-CM | POA: Diagnosis not present

## 2020-10-23 DIAGNOSIS — L659 Nonscarring hair loss, unspecified: Secondary | ICD-10-CM | POA: Diagnosis not present

## 2020-10-23 DIAGNOSIS — I8391 Asymptomatic varicose veins of right lower extremity: Secondary | ICD-10-CM | POA: Diagnosis not present

## 2020-11-13 ENCOUNTER — Encounter: Payer: Self-pay | Admitting: Podiatry

## 2020-11-13 ENCOUNTER — Ambulatory Visit: Payer: Medicare HMO | Admitting: Podiatry

## 2020-11-13 ENCOUNTER — Other Ambulatory Visit: Payer: Self-pay

## 2020-11-13 DIAGNOSIS — B351 Tinea unguium: Secondary | ICD-10-CM

## 2020-11-13 DIAGNOSIS — M79676 Pain in unspecified toe(s): Secondary | ICD-10-CM | POA: Diagnosis not present

## 2020-11-13 DIAGNOSIS — M25552 Pain in left hip: Secondary | ICD-10-CM | POA: Diagnosis not present

## 2020-11-13 DIAGNOSIS — Q828 Other specified congenital malformations of skin: Secondary | ICD-10-CM | POA: Diagnosis not present

## 2020-11-13 DIAGNOSIS — M545 Low back pain, unspecified: Secondary | ICD-10-CM | POA: Diagnosis not present

## 2020-11-14 DIAGNOSIS — M810 Age-related osteoporosis without current pathological fracture: Secondary | ICD-10-CM | POA: Diagnosis not present

## 2020-11-14 DIAGNOSIS — I10 Essential (primary) hypertension: Secondary | ICD-10-CM | POA: Diagnosis not present

## 2020-11-14 DIAGNOSIS — E7800 Pure hypercholesterolemia, unspecified: Secondary | ICD-10-CM | POA: Diagnosis not present

## 2020-11-14 DIAGNOSIS — Z0000 Encounter for general adult medical examination without abnormal findings: Secondary | ICD-10-CM | POA: Diagnosis not present

## 2020-11-14 DIAGNOSIS — Z79899 Other long term (current) drug therapy: Secondary | ICD-10-CM | POA: Diagnosis not present

## 2020-11-14 DIAGNOSIS — Z1389 Encounter for screening for other disorder: Secondary | ICD-10-CM | POA: Diagnosis not present

## 2020-11-14 DIAGNOSIS — K219 Gastro-esophageal reflux disease without esophagitis: Secondary | ICD-10-CM | POA: Diagnosis not present

## 2020-11-14 DIAGNOSIS — E041 Nontoxic single thyroid nodule: Secondary | ICD-10-CM | POA: Diagnosis not present

## 2020-11-14 DIAGNOSIS — L608 Other nail disorders: Secondary | ICD-10-CM | POA: Diagnosis not present

## 2020-11-20 NOTE — Progress Notes (Signed)
Subjective: 80 y.o. returns the office today for painful, elongated, thickened toenails which she is unable to trim herself as well as for calluses.  No acute changes noted to her feet.  Denies any open lesions.    Has bursitis in her left hip which is interfering with her walking.    Objective: AAO 3, NAD DP/PT pulses palpable, CRT less than 3 seconds Nails mildly hypertrophic, dystrophic, elongated, brittle, discolored 10. There is tenderness overlying the nails 1-5 bilaterally. There is no surrounding erythema or drainage along the nail sites.  Incurvation present to multiple lesser digit nails and there is no edema, erythema or signs of infection. Hyperkeratotic lesions of the distal aspect left second digit as well as the dorsal lateral aspect of the fifth digit left foot. No underlying ulceration, drainage or other signs of infection.  No pain with calf compression, swelling, warmth, erythema.  Assessment: Symptomatic onychomycosis; hyperkeratotic lesion left foot  Plan: -Treatment options including alternatives, risks, complications were discussed -Nails sharply debrided 10.  -Monitor of ingrown toenails for any signs or symptoms of infection or increased pain. -Hyperkeratotic lesion sharply debrided 2 without complication/bleeding. -Follow-up in 4 weeks at her request or sooner if any problems are to arise. In the meantime, encouraged to call the office with any questions, concerns, changes symptoms.  Celesta Gentile, DPM

## 2020-12-18 ENCOUNTER — Ambulatory Visit: Payer: Medicare HMO | Admitting: Podiatry

## 2020-12-20 DIAGNOSIS — M25552 Pain in left hip: Secondary | ICD-10-CM | POA: Diagnosis not present

## 2020-12-26 ENCOUNTER — Ambulatory Visit: Payer: Medicare HMO | Admitting: Podiatry

## 2020-12-26 ENCOUNTER — Other Ambulatory Visit: Payer: Self-pay

## 2020-12-26 DIAGNOSIS — B351 Tinea unguium: Secondary | ICD-10-CM | POA: Diagnosis not present

## 2020-12-26 DIAGNOSIS — Q828 Other specified congenital malformations of skin: Secondary | ICD-10-CM | POA: Diagnosis not present

## 2020-12-26 DIAGNOSIS — M79676 Pain in unspecified toe(s): Secondary | ICD-10-CM | POA: Diagnosis not present

## 2020-12-28 NOTE — Progress Notes (Addendum)
Subjective: 80 y.o. returns the office today for painful, elongated, thickened toenails which she is unable to trim herself as well as for calluses.  She was.  Trim the toenails and different ways of keeping the medial aspect of the nail slightly longer so it does not grow into the skin.  No acute changes noted to her feet.  Denies any open lesions.    Objective: AAO 3, NAD DP/PT pulses palpable, CRT less than 3 seconds Nails mildly hypertrophic, dystrophic, elongated, brittle, discolored 10. There is tenderness overlying the nails 1-5 bilaterally. There is no surrounding erythema or drainage along the nail sites.  Continuation incurvation to the lesser digit toenails left side worse than right.   Hyperkeratotic lesions of the distal aspect left second digit as well as the dorsal lateral aspect of the fifth digit left foot. No underlying ulceration, drainage or other signs of infection.  No pain with calf compression, swelling, warmth, erythema.  Assessment: Symptomatic onychomycosis; hyperkeratotic lesion left foot  Plan: -Treatment options including alternatives, risks, complications were discussed -Nails sharply debrided 10 without any complications or bleeding.  -Monitor of ingrown toenails for any signs or symptoms of infection or increased pain. -Hyperkeratotic lesion sharply debrided 2 without complication/bleeding. -Follow-up in 4 weeks at her request or sooner if any problems are to arise. In the meantime, encouraged to call the office with any questions, concerns, changes symptoms.  *Of note, the patient was not charged for the nail and callus trim as she was not aware at the time it was not a covered service as it was before the allowed time.   Celesta Gentile, DPM

## 2021-01-16 DIAGNOSIS — M25552 Pain in left hip: Secondary | ICD-10-CM | POA: Diagnosis not present

## 2021-01-16 DIAGNOSIS — M810 Age-related osteoporosis without current pathological fracture: Secondary | ICD-10-CM | POA: Diagnosis not present

## 2021-01-21 DIAGNOSIS — J0100 Acute maxillary sinusitis, unspecified: Secondary | ICD-10-CM | POA: Diagnosis not present

## 2021-01-28 ENCOUNTER — Other Ambulatory Visit: Payer: Self-pay

## 2021-01-28 ENCOUNTER — Encounter: Payer: Self-pay | Admitting: Podiatry

## 2021-01-28 ENCOUNTER — Ambulatory Visit: Payer: Medicare HMO | Admitting: Podiatry

## 2021-01-28 DIAGNOSIS — Q828 Other specified congenital malformations of skin: Secondary | ICD-10-CM

## 2021-01-28 DIAGNOSIS — B351 Tinea unguium: Secondary | ICD-10-CM | POA: Diagnosis not present

## 2021-01-28 DIAGNOSIS — M79676 Pain in unspecified toe(s): Secondary | ICD-10-CM

## 2021-01-30 NOTE — Progress Notes (Addendum)
Subjective: 80 y.o. returns the office today for painful, elongated, thickened toenails which she is unable to trim herself as well as for calluses.  She states that after the nails last appointment leaving the medial aspect longer on the left foot was helpful.  No swelling redness or drainage.  Also she notes calluses still present to the distal aspect left second toe causing discomfort.  She wants to trim down as close as possible.  No other changes.  No other concerns.   Objective: AAO 3, NAD DP/PT pulses palpable, CRT less than 3 seconds Nails hypertrophic, dystrophic, elongated, brittle, discolored 10. There is tenderness overlying the nails 1-5 bilaterally. There is no surrounding erythema or drainage along the nail sites.  Continuation incurvation to the lesser digit toenails left side worse than right.   Hyperkeratotic lesions of the distal aspect left second digit as well as the dorsal lateral aspect of the fifth digit left foot. No underlying ulceration, drainage or other signs of infection.  No pain with calf compression, swelling, warmth, erythema.  Assessment: Symptomatic onychomycosis; hyperkeratotic lesion left foot  Plan: -Treatment options including alternatives, risks, complications were discussed -Nails sharply debrided 10 without any complications or bleeding.  -Hyperkeratotic lesion sharply debrided 2 without complication/bleeding. -Follow-up in 4 weeks at her request or sooner if any problems are to arise. In the meantime, encouraged to call the office with any questions, concerns, changes symptoms.  *Of note, the patient was not charged for the nail and callus trim as she was not aware at the time it was not a covered service as it was before the allowed time.   Celesta Gentile, DPM

## 2021-02-01 DIAGNOSIS — J301 Allergic rhinitis due to pollen: Secondary | ICD-10-CM | POA: Diagnosis not present

## 2021-02-01 DIAGNOSIS — E7800 Pure hypercholesterolemia, unspecified: Secondary | ICD-10-CM | POA: Diagnosis not present

## 2021-02-01 DIAGNOSIS — I10 Essential (primary) hypertension: Secondary | ICD-10-CM | POA: Diagnosis not present

## 2021-02-06 DIAGNOSIS — Z23 Encounter for immunization: Secondary | ICD-10-CM | POA: Diagnosis not present

## 2021-02-26 ENCOUNTER — Encounter: Payer: Self-pay | Admitting: Podiatry

## 2021-02-26 ENCOUNTER — Other Ambulatory Visit: Payer: Self-pay

## 2021-02-26 ENCOUNTER — Ambulatory Visit: Payer: Medicare HMO | Admitting: Podiatry

## 2021-02-26 DIAGNOSIS — B351 Tinea unguium: Secondary | ICD-10-CM | POA: Diagnosis not present

## 2021-02-26 DIAGNOSIS — M79676 Pain in unspecified toe(s): Secondary | ICD-10-CM | POA: Diagnosis not present

## 2021-02-26 DIAGNOSIS — Q828 Other specified congenital malformations of skin: Secondary | ICD-10-CM

## 2021-02-27 DIAGNOSIS — M25552 Pain in left hip: Secondary | ICD-10-CM | POA: Diagnosis not present

## 2021-02-27 NOTE — Progress Notes (Signed)
Subjective: 80 y.o. returns the office today for painful, elongated, thickened toenails which she is unable to trim herself as well as for a callus to her second digit on her left foot.  She denies any swelling or redness or any drainage or any open lesions.  She has no new concerns today.  Objective: AAO 3, NAD DP/PT pulses palpable, CRT less than 3 seconds Nails hypertrophic, dystrophic, elongated, brittle, discolored 10. There is tenderness overlying the nails 1-5 bilaterally. There is no surrounding erythema or drainage along the nail sites.  Continuation incurvation to the lesser digit toenails left side worse than right.   Hyperkeratotic lesions of the distal aspect left second digit. No underlying ulceration, drainage or other signs of infection.  No pain with calf compression, swelling, warmth, erythema.  Assessment: Symptomatic onychomycosis; hyperkeratotic lesion left foot  Plan: -Treatment options including alternatives, risks, complications were discussed -Nails sharply debrided 10 without any complications or bleeding.  -Hyperkeratotic lesion sharply debrided 1 without complication/bleeding. -Follow-up in 4 weeks at her request or sooner if any problems are to arise. In the meantime, encouraged to call the office with any questions, concerns, changes symptoms.  Celesta Gentile, DPM

## 2021-03-05 DIAGNOSIS — M25562 Pain in left knee: Secondary | ICD-10-CM | POA: Diagnosis not present

## 2021-03-05 DIAGNOSIS — M545 Low back pain, unspecified: Secondary | ICD-10-CM | POA: Diagnosis not present

## 2021-03-07 DIAGNOSIS — H26491 Other secondary cataract, right eye: Secondary | ICD-10-CM | POA: Diagnosis not present

## 2021-03-07 DIAGNOSIS — H04122 Dry eye syndrome of left lacrimal gland: Secondary | ICD-10-CM | POA: Diagnosis not present

## 2021-03-07 DIAGNOSIS — Z961 Presence of intraocular lens: Secondary | ICD-10-CM | POA: Diagnosis not present

## 2021-03-12 DIAGNOSIS — M25552 Pain in left hip: Secondary | ICD-10-CM | POA: Diagnosis not present

## 2021-03-18 DIAGNOSIS — M545 Low back pain, unspecified: Secondary | ICD-10-CM | POA: Diagnosis not present

## 2021-03-26 ENCOUNTER — Ambulatory Visit: Payer: Medicare HMO | Admitting: Podiatry

## 2021-03-26 ENCOUNTER — Other Ambulatory Visit: Payer: Self-pay

## 2021-03-26 DIAGNOSIS — M79676 Pain in unspecified toe(s): Secondary | ICD-10-CM | POA: Diagnosis not present

## 2021-03-26 DIAGNOSIS — R252 Cramp and spasm: Secondary | ICD-10-CM | POA: Diagnosis not present

## 2021-03-26 DIAGNOSIS — Q828 Other specified congenital malformations of skin: Secondary | ICD-10-CM

## 2021-03-26 DIAGNOSIS — B351 Tinea unguium: Secondary | ICD-10-CM | POA: Diagnosis not present

## 2021-04-01 NOTE — Progress Notes (Signed)
Subjective: 81 y.o. returns the office today for painful, elongated, thickened toenails which she is unable to trim herself as well as for a callus to her second digit on her left foot.  She denies any swelling or redness or any drainage or any open lesions.  Also concerned about a cramping sensation that she gets on her right leg.  This seems to happen when he is driving at times but also occasionally.  She did discuss with her primary care physician they thought was her statin since stopped it about 6 weeks ago and the symptoms did resolve her about a week ago it started to come back.  No numbness associated with this or tingling.  Theraworx does help. She has no new concerns today.  Objective: AAO 3, NAD DP/PT pulses palpable, CRT less than 3 seconds Nails hypertrophic, dystrophic, elongated, brittle, discolored 10. There is tenderness overlying the nails 1-5 bilaterally. There is no surrounding erythema or drainage along the nail sites.  Continuation incurvation to the lesser digit toenails left side worse than right.   Hyperkeratotic lesions of the distal aspect left second digit. No underlying ulceration, drainage or other signs of infection.  No significant cramping present on the right side today and there is no area pinpoint tenderness.  Flexor, extensor tendons appear to be intact. No pain with calf compression, swelling, warmth, erythema.  Assessment: Symptomatic onychomycosis; hyperkeratotic lesion left foot; right leg cramping  Plan: -Treatment options including alternatives, risks, complications were discussed -Nails sharply debrided 10 without any complications or bleeding.  -Hyperkeratotic lesion sharply debrided 1 without complication/bleeding. -Due to leg pain, cramping an ABI in the office which was normal.  Left 1,19, right 1.2.  We discussed stretching as well as compression socks.  Discussed staying hydrated as well. -Follow-up in 4 weeks at her request or sooner if any  problems are to arise. In the meantime, encouraged to call the office with any questions, concerns, changes symptoms.  Celesta Gentile, DPM

## 2021-04-08 ENCOUNTER — Other Ambulatory Visit: Payer: Self-pay

## 2021-04-08 ENCOUNTER — Ambulatory Visit: Payer: Medicare HMO | Attending: Sports Medicine

## 2021-04-08 DIAGNOSIS — M859 Disorder of bone density and structure, unspecified: Secondary | ICD-10-CM | POA: Insufficient documentation

## 2021-04-08 DIAGNOSIS — Z7983 Long term (current) use of bisphosphonates: Secondary | ICD-10-CM | POA: Diagnosis not present

## 2021-04-08 DIAGNOSIS — M51369 Other intervertebral disc degeneration, lumbar region without mention of lumbar back pain or lower extremity pain: Secondary | ICD-10-CM

## 2021-04-08 DIAGNOSIS — R262 Difficulty in walking, not elsewhere classified: Secondary | ICD-10-CM

## 2021-04-08 DIAGNOSIS — M5136 Other intervertebral disc degeneration, lumbar region: Secondary | ICD-10-CM | POA: Diagnosis not present

## 2021-04-08 DIAGNOSIS — M6281 Muscle weakness (generalized): Secondary | ICD-10-CM

## 2021-04-08 DIAGNOSIS — R293 Abnormal posture: Secondary | ICD-10-CM

## 2021-04-08 NOTE — Patient Instructions (Signed)
Access Code: [email or web address] URL: [email or web address] Date: 04/08/2021 Prepared by: Candyce Churn  Exercises Standing Hamstring Stretch on Chair - 2 x daily - 7 x weekly - 1 sets - 3 reps - 30 sec hold Standing Quad Stretch with Table and Chair Support - 2 x daily - 7 x weekly - 1 sets - 3 reps - 30 sec hold Seated Lateral Trunk Stretch on Swiss Ball - 2 x daily - 7 x weekly - 1 sets - 10 reps - 10 sec hold

## 2021-04-08 NOTE — Therapy (Signed)
[facility] @ [address] [place], [place], [address] Phone: [phone]   Fax:  [phone]  Physical Therapy Evaluation  Patient Details  Name: Beth Simon MRN: [id number] Date of Birth: 05/31/1940 Referring Provider (PT): Dr. Wandra Feinstein   Encounter Date: 04/08/2021   PT End of Session - 04/08/21 2014     Visit Number 1    Date for PT Re-Evaluation 06/03/21    Authorization Type [employer]na Medicare    PT Start Time 1018    PT Stop Time 1100    PT Time Calculation (min) 42 min    Activity Tolerance Patient tolerated treatment well    Behavior During Therapy [facility] for tasks assessed/performed             Past Medical History:  Diagnosis Date   Adenomatous polyps 01/2016   Anemia    Breast cancer ([place]) 1990   bilateral mastectomy, adenoca breast-left MRM, reconstruction, chemo   Bronchitis last 2 weeks   saw dr Felipa Eth 02-28-2014, he said no antibiotics needed, nonproductive cough   Complication of anesthesia    Cystitis    cytoxen, had once or twice   Family history of breast cancer    History of breast cancer    History of radiation therapy 2/10, 2/12, 2/18, 2/24, 05/29/14   vaginal cuff/ 30 Gy/ 5 fx   Hypertension    Macular degeneration    Neck pain    taking physictal therapy last 2 weeks   Osteoporosis    On Prolia.   PONV (postoperative nausea and vomiting)    Shoulder pain    taking physical therapy for last few weeks   Uterine cancer ([place]) 03/21/2014   MLH1/PMS2 LOH   Uterine fibroid    VAIN I (vaginal intraepithelial neoplasia grade I) 2018   positive HR HPV.   Vasovagal syncopes     Past Surgical History:  Procedure Laterality Date   BREAST IMPLANT REMOVAL  6/94   left breast   BREAST RECONSTRUCTION  1990   left   CATARACT EXTRACTION Bilateral    EXCISION VAGINAL CYST     MASTECTOMY Right 10/08/06   prophylactic   RADICAL MASTECTOMY LND  1990   left, chemo done   ROBOTIC  ASSISTED TOTAL HYSTERECTOMY WITH BILATERAL SALPINGO OOPHERECTOMY Bilateral 03/21/2014   Procedure: ROBOTIC ASSISTED TOTAL HYSTERECTOMY WITH BILATERAL SALPINGO OOPHORECTOMY ;  Surgeon: Janie Morning, MD;  Location: [facility];  Service: Gynecology;  Laterality: Bilateral;    There were no vitals filed for this visit.    Subjective Assessment - 04/08/21 1024     Subjective Patient states she was sitting over on her left hip looking at her mail for quite some time one day and gradually started having pain in the left hip at the outer aspect of the hip, she received injections and this resolved but she continued to have pain in the left S.I. area that was then assessed and diagnosed as lumbar disc degeneration and nerve root impingement.  Patient saw Dr. Layne Benton and Celebrex was ordered but this gave her headaches.  She is "not a big pill taker".  She states she has tried ice for pain relief.  Has not tried heat other than her heated seat in her car.  She states she had been walking with friends but she had to stop due to the hip pain.  She hopes to gain control of her hip and back pain and be able to resume  her normal activity.    Pertinent History Htn, osteoporosis    Limitations Walking    How long can you sit comfortably? 30 min    How long can you stand comfortably? 15 min    How long can you walk comfortably? 15 min    Diagnostic tests xrays: DJD lumbar, scoliosis    Patient Stated Goals To gain control of her back and hip pain and be able to resume her prior level of function    Currently in Pain? Yes    Pain Score 3     Pain Location Back    Pain Orientation Left;Lower    Pain Descriptors / Indicators Aching;Nagging    Pain Type Chronic pain    Pain Radiating Towards Left hip    Pain Onset More than a month ago    Pain Frequency Intermittent    Aggravating Factors  standing, walking    Pain Relieving Factors rest                [facility] PT Assessment - 04/08/21 0001        Assessment   Medical Diagnosis Lumbar DDD    Referring Provider (PT) Dr. Wandra Feinstein    Onset Date/Surgical Date 08/27/20    Hand Dominance Right    Next MD Visit no    Prior Therapy pilates with Anderson Malta      Precautions   Precautions Other (comment)    Precaution Comments On Prolia for bone density      Balance Screen   Has the patient fallen in the past 6 months No      [place] residence    Living Arrangements Alone    Type of Home Other(Comment)    Home Access Level entry   [place] One level      Prior Function   Level of Independence Independent    Vocation Retired    Secondary school teacher, church activities, plays piano      Observation/Other Assessments   Focus on Therapeutic Outcomes (FOTO)  67      Sensation   Light Touch Appears Intact      Functional Tests   Functional tests Sit to Stand      Sit to Stand   Comments able to come to stand with minimal use of bilateral UE's      Posture/Postural Control   Posture/Postural Control Postural limitations    Postural Limitations Increased thoracic kyphosis;Weight shift right    Posture Comments moderate thoracic and lumbar S curve scoliosis: more pronounced on fwd flexion      ROM / Strength   AROM / PROM / Strength AROM;Strength      AROM   AROM Assessment Site Lumbar    Lumbar Flexion to mid shin    Lumbar Extension WFL    Lumbar - Right Side Bend to joint line    Lumbar - Left Side Bend to joint line    Lumbar - Right Rotation WFL    Lumbar - Left Rotation [facility]      Strength   Overall Strength Within functional limits for tasks performed                        Objective measurements completed on examination: See above findings.                PT Education - 04/08/21 2008     Education  Details Access Code: [email or web address]    Person(s) Educated Patient    Methods Explanation;Demonstration;Verbal cues;Handout    Comprehension  Verbalized understanding;Returned demonstration;Verbal cues required              PT Short Term Goals - 04/08/21 2054       PT SHORT TERM GOAL #1   Title be independent in initial HEP    Time 4    Period Weeks    Status New    Target Date 05/06/21      PT SHORT TERM GOAL #2   Title Patient to be able to walk 15 min without pain or need to stop and rest.    Time 4    Period Weeks    Status New    Target Date 05/06/21               PT Long Term Goals - 04/08/21 2102       PT LONG TERM GOAL #1   Title be independent in advanced HEP    Time 8    Period Weeks    Status New    Target Date 06/03/21      PT LONG TERM GOAL #2   Title Patient will report pain at no greater than 2/10    Time 8    Period Weeks    Status New    Target Date 06/03/21      PT LONG TERM GOAL #3   Title Patient will be able to resume walking program with her friends    Time 8    Period Weeks    Status New    Target Date 06/03/21      PT LONG TERM GOAL #4   Title FOTO to improve to 69    Time 8    Period Weeks    Status New    Target Date 06/03/21                    Plan - 04/08/21 2016     Clinical Impression Statement Patient is an 81 y.o. female with recent history of left hip and low back pain.  She had existing scoliosis which likely contributed to the onset of her low back and left hip pain.  She is quite active and enjoyed walking with friends for exercise.  She also participates in Pilates. Xrays reveal degenerative changes of the lumbar spine.  She presents with moderate S curve scoliosis, tightness in bilateral hamstrings, hip flexors and quads.  She has full lumbar ROM and is neurologically intact.  She would benefit from LE stretching, core stabilization and lateral trunk stretching.  Her goal is to gain control of her back pain and resume prior level of function.    Personal Factors and Comorbidities Age;Comorbidity 1;Comorbidity 2    Comorbidities Htn,  osteoporosis    Examination-Activity Limitations Stand;Squat    Examination-Participation Restrictions Community Activity;Cleaning;Laundry    Stability/Clinical Decision Making Evolving/Moderate complexity    Clinical Decision Making Moderate    Rehab Potential Fair    PT Frequency 2x / week    PT Duration 8 weeks    PT Treatment/Interventions ADLs/Self Care Home Management;Aquatic Therapy;Moist Heat;Iontophoresis 4mg /ml Dexamethasone;Electrical Stimulation;Cryotherapy;Ultrasound;Gait training;Therapeutic exercise;Therapeutic activities;Functional mobility training;Stair training;Balance training;Neuromuscular re-education;Patient/family education;Manual techniques;Passive range of motion;Taping;Dry needling    PT Next Visit Plan Review HEP, progress core sterngthening and trunk stretches.    PT Home Exercise Plan Access Code: [email or web address]    Consulted and Agree with Plan of Care  Patient             Patient will benefit from skilled therapeutic intervention in order to improve the following deficits and impairments:  Abnormal gait, Decreased balance, Decreased mobility, Difficulty walking, Decreased strength, Increased fascial restricitons, Impaired flexibility, Postural dysfunction, Pain  Visit Diagnosis: Abnormal posture - Plan: PT plan of care cert/re-cert  Muscle weakness (generalized) - Plan: PT plan of care cert/re-cert  Lumbar degenerative disc disease - Plan: PT plan of care cert/re-cert  Difficulty in walking, not elsewhere classified - Plan: PT plan of care cert/re-cert     Problem List Patient Active Problem List   Diagnosis Date Noted   Age-related osteoporosis without current pathological fracture 03/19/2020   Personal history of colonic polyps 03/19/2020   Transient cerebral ischemia 09/15/2017   Pure hypercholesterolemia 09/15/2017   Pulmonary nodule 09/15/2017   Hypertension 09/15/2017   Gastro-esophageal reflux disease without esophagitis 09/15/2017   VAIN I  (vaginal intraepithelial neoplasia grade I) 06/23/2017   Genetic testing 05/23/2014   History of breast cancer    Family history of breast cancer    Endometrial cancer ([place]) 03/21/2014   Uterine cancer ([place]) 03/21/2014   S/P mastectomy, bilateral 05/02/2013   Situational stress 05/02/2013   Osteoporosis, unspecified 05/02/2013   Breast cancer, left breast ([place]) 05/02/2013    Anderson Malta B. Gradyn Shein, PT 01/09/239:19 PM   [place] @ [address] [place], [place], [address] Phone: [phone]   Fax:  [phone]  Name: Beth Simon MRN: [id number] Date of Birth: 1941/01/14

## 2021-04-11 ENCOUNTER — Ambulatory Visit: Payer: Medicare HMO

## 2021-04-11 ENCOUNTER — Other Ambulatory Visit: Payer: Self-pay

## 2021-04-11 DIAGNOSIS — Z7983 Long term (current) use of bisphosphonates: Secondary | ICD-10-CM | POA: Diagnosis not present

## 2021-04-11 DIAGNOSIS — R262 Difficulty in walking, not elsewhere classified: Secondary | ICD-10-CM

## 2021-04-11 DIAGNOSIS — M5136 Other intervertebral disc degeneration, lumbar region: Secondary | ICD-10-CM | POA: Diagnosis not present

## 2021-04-11 DIAGNOSIS — R293 Abnormal posture: Secondary | ICD-10-CM

## 2021-04-11 DIAGNOSIS — M6281 Muscle weakness (generalized): Secondary | ICD-10-CM

## 2021-04-11 DIAGNOSIS — M859 Disorder of bone density and structure, unspecified: Secondary | ICD-10-CM | POA: Diagnosis not present

## 2021-04-11 NOTE — Patient Instructions (Signed)

## 2021-04-11 NOTE — Therapy (Signed)
[place] @ [address] [place], [place], [address] Phone: [phone]   Fax:  [phone]  Physical Therapy Treatment  Patient Details  Name: Beth Simon MRN: [id number] Date of Birth: [birth year] Referring Provider (PT): Dr. Wandra Feinstein   Encounter Date: 04/11/2021   PT End of Session - 04/11/21 1013     Visit Number 2    Date for PT Re-Evaluation 06/03/21    Authorization Type Aetna Medicare    Progress Note Due on Visit 10    PT Start Time 0850    PT Stop Time 0927    PT Time Calculation (min) 37 min    Activity Tolerance Patient tolerated treatment well    Behavior During Therapy [facility] for tasks assessed/performed             Past Medical History:  Diagnosis Date   Adenomatous polyps 01/2016   Anemia    Breast cancer ([place]) 1990   bilateral mastectomy, adenoca breast-left MRM, reconstruction, chemo   Bronchitis last 2 weeks   saw dr Felipa Eth 02-28-2014, he said no antibiotics needed, nonproductive cough   Complication of anesthesia    Cystitis    cytoxen, had once or twice   Family history of breast cancer    History of breast cancer    History of radiation therapy 2/10, 2/12, 2/18, 2/24, 05/29/14   vaginal cuff/ 30 Gy/ 5 fx   Hypertension    Macular degeneration    Neck pain    taking physictal therapy last 2 weeks   Osteoporosis    On Prolia.   PONV (postoperative nausea and vomiting)    Shoulder pain    taking physical therapy for last few weeks   Uterine cancer ([place]) 03/21/2014   MLH1/PMS2 LOH   Uterine fibroid    VAIN I (vaginal intraepithelial neoplasia grade I) 2018   positive HR HPV.   Vasovagal syncopes     Past Surgical History:  Procedure Laterality Date   BREAST IMPLANT REMOVAL  6/94   left breast   BREAST RECONSTRUCTION  1990   left   CATARACT EXTRACTION Bilateral    EXCISION VAGINAL CYST     MASTECTOMY Right 10/08/06   prophylactic   RADICAL MASTECTOMY LND  1990    left, chemo done   ROBOTIC ASSISTED TOTAL HYSTERECTOMY WITH BILATERAL SALPINGO OOPHERECTOMY Bilateral 03/21/2014   Procedure: ROBOTIC ASSISTED TOTAL HYSTERECTOMY WITH BILATERAL SALPINGO OOPHORECTOMY ;  Surgeon: Janie Morning, MD;  Location: [facility];  Service: Gynecology;  Laterality: Bilateral;    There were no vitals filed for this visit.   Subjective Assessment - 04/11/21 0855     Subjective I had some Rt gluteal pain after starting the exercises so I held doing them until seeing you today.  Today it is feeling better.    Currently in Pain? Yes    Pain Score 2     Pain Location Back    Pain Orientation Left;Lower    Pain Descriptors / Indicators Aching    Pain Type Chronic pain    Pain Onset More than a month ago    Pain Frequency Intermittent    Aggravating Factors  standing and walking    Pain Relieving Factors rest                               [facility] Adult PT Treatment/Exercise - 04/11/21 0001  Exercises   Exercises Lumbar;Knee/Hip      Lumbar Exercises: Stretches   Active Hamstring Stretch 3 reps;20 seconds    Quad Stretch 2 reps;20 seconds    Other Lumbar Stretch Exercise seated side stretch 2x20 seconds      Lumbar Exercises: Aerobic   Nustep Level 2x 6 minutes -PT present to discuss progress      Manual Therapy   Manual Therapy Soft tissue mobilization;Myofascial release    Manual therapy comments skilled palpation and monitoring with DN today.    Soft tissue mobilization elongation and trigger point release to bil lumbar spine and gluteals              Trigger Point Dry Needling - 04/11/21 0001     Consent Given? Yes    Education Handout Provided Previously provided    Muscles Treated Back/Hip Gluteus minimus;Gluteus medius;Lumbar multifidi    Gluteus Minimus Response Twitch response elicited;Palpable increased muscle length    Gluteus Medius Response Twitch response elicited;Palpable increased muscle length    Lumbar multifidi  Response Twitch response elicited;Palpable increased muscle length                   PT Education - 04/11/21 0908     Education Details DN info    Person(s) Educated Patient    Methods Explanation;Demonstration;Handout    Comprehension Verbalized understanding;Returned demonstration              PT Short Term Goals - 04/08/21 2054       PT SHORT TERM GOAL #1   Title be independent in initial HEP    Time 4    Period Weeks    Status New    Target Date 05/06/21      PT SHORT TERM GOAL #2   Title Patient to be able to walk 15 min without pain or need to stop and rest.    Time 4    Period Weeks    Status New    Target Date 05/06/21               PT Long Term Goals - 04/08/21 2102       PT LONG TERM GOAL #1   Title be independent in advanced HEP    Time 8    Period Weeks    Status New    Target Date 06/03/21      PT LONG TERM GOAL #2   Title Patient will report pain at no greater than 2/10    Time 8    Period Weeks    Status New    Target Date 06/03/21      PT LONG TERM GOAL #3   Title Patient will be able to resume walking program with her friends    Time 8    Period Weeks    Status New    Target Date 06/03/21      PT LONG TERM GOAL #4   Title FOTO to improve to 69    Time 8    Period Weeks    Status New    Target Date 06/03/21                   Plan - 04/11/21 1012     Clinical Impression Statement First time follow-up after evaluation today.  pt with onset of Rt gluteal pain after starting exercise so she stopped until seeing PT today.  PT reviewed all exercises and modified to eliminate quad stretch as  it caused muscle cramp.  Hamstring stretch was modified to be performed in sitting.  Pt with tension and trigger points in bil lumbar spine and bil gluteals and had good twitch response and improved tissue mobility after DN and elongation today.  Pt will benefit from skilled PT to address lumbar and gluteal pain.    PT  Frequency 2x / week    PT Duration 8 weeks    PT Treatment/Interventions ADLs/Self Care Home Management;Aquatic Therapy;Moist Heat;Iontophoresis 4mg /ml Dexamethasone;Electrical Stimulation;Cryotherapy;Ultrasound;Gait training;Therapeutic exercise;Therapeutic activities;Functional mobility training;Stair training;Balance training;Neuromuscular re-education;Patient/family education;Manual techniques;Passive range of motion;Taping;Dry needling    PT Next Visit Plan Review HEP, add to HEP, progress core sterngthening and trunk stretches. DN if helpful    PT Home Exercise Plan Access Code: [email or web address]    Consulted and Agree with Plan of Care Patient             Patient will benefit from skilled therapeutic intervention in order to improve the following deficits and impairments:  Abnormal gait, Decreased balance, Decreased mobility, Difficulty walking, Decreased strength, Increased fascial restricitons, Impaired flexibility, Postural dysfunction, Pain  Visit Diagnosis: Abnormal posture  Muscle weakness (generalized)  Lumbar degenerative disc disease  Difficulty in walking, not elsewhere classified     Problem List Patient Active Problem List   Diagnosis Date Noted   Age-related osteoporosis without current pathological fracture 03/19/2020   Personal history of colonic polyps 03/19/2020   Transient cerebral ischemia 09/15/2017   Pure hypercholesterolemia 09/15/2017   Pulmonary nodule 09/15/2017   Hypertension 09/15/2017   Gastro-esophageal reflux disease without esophagitis 09/15/2017   VAIN I (vaginal intraepithelial neoplasia grade I) 06/23/2017   Genetic testing 05/23/2014   History of breast cancer    Family history of breast cancer    Endometrial cancer ([place]) 03/21/2014   Uterine cancer ([place]) 03/21/2014   S/P mastectomy, bilateral 05/02/2013   Situational stress 05/02/2013   Osteoporosis, unspecified 05/02/2013   Breast cancer, left breast ([place]) 05/02/2013  Sigurd Sos,  PT 04/11/21 10:15 AM   [place] @ [address] [place] [place], [place], [address] Phone: [phone]   Fax:  [phone]  Name: EDELL MESENBRINK MRN: [id number] Date of Birth: 28-Mar-1941

## 2021-04-15 ENCOUNTER — Other Ambulatory Visit: Payer: Self-pay

## 2021-04-15 ENCOUNTER — Encounter: Payer: Self-pay | Admitting: Physical Therapy

## 2021-04-15 ENCOUNTER — Ambulatory Visit: Payer: Medicare HMO | Admitting: Physical Therapy

## 2021-04-15 DIAGNOSIS — R293 Abnormal posture: Secondary | ICD-10-CM

## 2021-04-15 DIAGNOSIS — Z7983 Long term (current) use of bisphosphonates: Secondary | ICD-10-CM | POA: Diagnosis not present

## 2021-04-15 DIAGNOSIS — M5136 Other intervertebral disc degeneration, lumbar region: Secondary | ICD-10-CM | POA: Diagnosis not present

## 2021-04-15 DIAGNOSIS — M6281 Muscle weakness (generalized): Secondary | ICD-10-CM

## 2021-04-15 DIAGNOSIS — M859 Disorder of bone density and structure, unspecified: Secondary | ICD-10-CM | POA: Diagnosis not present

## 2021-04-15 NOTE — Therapy (Signed)
[place] @ [place], [place], [address] Phone: [phone]   Fax:  [phone]  Physical Therapy Treatment  Patient Details  Name: Beth Simon MRN: [id number] Date of Birth: 03/24/1941 Referring Provider (PT): Dr. Wandra Feinstein   Encounter Date: 04/15/2021   PT End of Session - 04/15/21 0857     Visit Number 3    Date for PT Re-Evaluation 06/03/21    Authorization Type [employer] Medicare    Progress Note Due on Visit 10    PT Start Time [phone]   pt late   PT Stop Time 0928    PT Time Calculation (min) 35 min    Activity Tolerance Patient tolerated treatment well    Behavior During Therapy [facility] for tasks assessed/performed             Past Medical History:  Diagnosis Date   Adenomatous polyps 01/2016   Anemia    Breast cancer ([place]) 1990   bilateral mastectomy, adenoca breast-left MRM, reconstruction, chemo   Bronchitis last 2 weeks   saw dr Felipa Eth 02-28-2014, he said no antibiotics needed, nonproductive cough   Complication of anesthesia    Cystitis    cytoxen, had once or twice   Family history of breast cancer    History of breast cancer    History of radiation therapy 2/10, 2/12, 2/18, 2/24, 05/29/14   vaginal cuff/ 30 Gy/ 5 fx   Hypertension    Macular degeneration    Neck pain    taking physictal therapy last 2 weeks   Osteoporosis    On Prolia.   PONV (postoperative nausea and vomiting)    Shoulder pain    taking physical therapy for last few weeks   Uterine cancer ([place]) 03/21/2014   MLH1/PMS2 LOH   Uterine fibroid    VAIN I (vaginal intraepithelial neoplasia grade I) 2018   positive HR HPV.   Vasovagal syncopes     Past Surgical History:  Procedure Laterality Date   BREAST IMPLANT REMOVAL  6/94   left breast   BREAST RECONSTRUCTION  1990   left   CATARACT EXTRACTION Bilateral    EXCISION VAGINAL CYST     MASTECTOMY Right 10/08/06   prophylactic   RADICAL MASTECTOMY LND   1990   left, chemo done   ROBOTIC ASSISTED TOTAL HYSTERECTOMY WITH BILATERAL SALPINGO OOPHERECTOMY Bilateral 03/21/2014   Procedure: ROBOTIC ASSISTED TOTAL HYSTERECTOMY WITH BILATERAL SALPINGO OOPHORECTOMY ;  Surgeon: Janie Morning, MD;  Location: [facility];  Service: Gynecology;  Laterality: Bilateral;    There were no vitals filed for this visit.   Subjective Assessment - 04/15/21 0858     Subjective The needling was very good. I was a little sore but not bad.    Pertinent History Htn, osteoporosis    Currently in Pain? Yes    Pain Score 2     Pain Location Back    Pain Orientation Left;Lower    Pain Descriptors / Indicators Dull;Sore                               [facility] Adult PT Treatment/Exercise - 04/15/21 0001       Lumbar Exercises: Stretches   Active Hamstring Stretch Left;3 reps    Active Hamstring Stretch Limitations used strap since we were already supine    Single Knee to Chest Stretch Left;3 reps;20 seconds    Lower Trunk  Rotation 3 reps;30 seconds    Lower Trunk Rotation Limitations LT    Pelvic Tilt 10 reps;5 seconds      Manual Therapy   Manual Therapy Soft tissue mobilization    Soft tissue mobilization Addaday assist in SL to Lt glutes and QL                       PT Short Term Goals - 04/08/21 2054       PT SHORT TERM GOAL #1   Title be independent in initial HEP    Time 4    Period Weeks    Status New    Target Date 05/06/21      PT SHORT TERM GOAL #2   Title Patient to be able to walk 15 min without pain or need to stop and rest.    Time 4    Period Weeks    Status New    Target Date 05/06/21               PT Long Term Goals - 04/08/21 2102       PT LONG TERM GOAL #1   Title be independent in advanced HEP    Time 8    Period Weeks    Status New    Target Date 06/03/21      PT LONG TERM GOAL #2   Title Patient will report pain at no greater than 2/10    Time 8    Period Weeks    Status New     Target Date 06/03/21      PT LONG TERM GOAL #3   Title Patient will be able to resume walking program with her friends    Time 8    Period Weeks    Status New    Target Date 06/03/21      PT LONG TERM GOAL #4   Title FOTO to improve to 69    Time 8    Period Weeks    Status New    Target Date 06/03/21                   Plan - 04/15/21 0859     Clinical Impression Statement Pt is independent in her initial HEP. Pt likes the mods for the hamstring stretch. Pt reports the dry needling was very helpful. In place of needling we perfromed soft tissue mobs using the Addaday. Pt had no pain post session. Pt was 10 min late, nothing new added to HEP secondary to time contraints.    Personal Factors and Comorbidities Age;Comorbidity 1;Comorbidity 2    Comorbidities Htn, osteoporosis    Examination-Activity Limitations Stand;Squat    Examination-Participation Restrictions Community Activity;Cleaning;Laundry    Stability/Clinical Decision Making Evolving/Moderate complexity    Rehab Potential Fair    PT Frequency 2x / week    PT Duration 8 weeks    PT Treatment/Interventions ADLs/Self Care Home Management;Aquatic Therapy;Moist Heat;Iontophoresis 9m/ml Dexamethasone;Electrical Stimulation;Cryotherapy;Ultrasound;Gait training;Therapeutic exercise;Therapeutic activities;Functional mobility training;Stair training;Balance training;Neuromuscular re-education;Patient/family education;Manual techniques;Passive range of motion;Taping;Dry needling    PT Next Visit Plan progress core  strength/ trunk and hip stretches. DN when seen by PT    PT Home Exercise Plan Access Code: [email or web address]    Consulted and Agree with Plan of Care Patient             Patient will benefit from skilled therapeutic intervention in order to improve the following deficits and impairments:  Abnormal gait, Decreased  balance, Decreased mobility, Difficulty walking, Decreased strength, Increased fascial restricitons,  Impaired flexibility, Postural dysfunction, Pain  Visit Diagnosis: Abnormal posture  Muscle weakness (generalized)  Lumbar degenerative disc disease     Problem List Patient Active Problem List   Diagnosis Date Noted   Age-related osteoporosis without current pathological fracture 03/19/2020   Personal history of colonic polyps 03/19/2020   Transient cerebral ischemia 09/15/2017   Pure hypercholesterolemia 09/15/2017   Pulmonary nodule 09/15/2017   Hypertension 09/15/2017   Gastro-esophageal reflux disease without esophagitis 09/15/2017   VAIN I (vaginal intraepithelial neoplasia grade I) 06/23/2017   Genetic testing 05/23/2014   History of breast cancer    Family history of breast cancer    Endometrial cancer ([place]) 03/21/2014   Uterine cancer ([place]) 03/21/2014   S/P mastectomy, bilateral 05/02/2013   Situational stress 05/02/2013   Osteoporosis, unspecified 05/02/2013   Breast cancer, left breast ([place]) 05/02/2013    Dashana Guizar, PTA 04/15/2021, 9:46 AM  [facility] @ [place], [place], [address] Phone: [phone]   Fax:  [phone]  Name: JOYCELIN RADLOFF MRN: [id number] Date of Birth: [birth year]

## 2021-04-18 ENCOUNTER — Other Ambulatory Visit: Payer: Self-pay

## 2021-04-18 ENCOUNTER — Ambulatory Visit: Payer: Medicare HMO

## 2021-04-18 DIAGNOSIS — M5136 Other intervertebral disc degeneration, lumbar region: Secondary | ICD-10-CM | POA: Diagnosis not present

## 2021-04-18 DIAGNOSIS — M6281 Muscle weakness (generalized): Secondary | ICD-10-CM

## 2021-04-18 DIAGNOSIS — Z7983 Long term (current) use of bisphosphonates: Secondary | ICD-10-CM | POA: Diagnosis not present

## 2021-04-18 DIAGNOSIS — M859 Disorder of bone density and structure, unspecified: Secondary | ICD-10-CM | POA: Diagnosis not present

## 2021-04-18 DIAGNOSIS — R293 Abnormal posture: Secondary | ICD-10-CM

## 2021-04-18 NOTE — Therapy (Signed)
[facility] @ [place] [address] [place], [place], [address] Phone: [phone]   Fax:  [phone]  Physical Therapy Treatment  Patient Details  Name: Beth Simon MRN: [id number] Date of Birth: 1940-08-12 Referring Provider (PT): Dr. Albertha Ghee   Encounter Date: 04/18/2021   PT End of Session - 04/18/21 0930     Visit Number 4    Date for PT Re-Evaluation 06/03/21    Authorization Type [employer]na Medicare    Progress Note Due on Visit 10    PT Start Time [phone]    PT Stop Time 0930    PT Time Calculation (min) 41 min    Activity Tolerance Patient tolerated treatment well    Behavior During Therapy [facility] for tasks assessed/performed             Past Medical History:  Diagnosis Date   Adenomatous polyps 01/2016   Anemia    Breast cancer ([facility]) 1990   bilateral mastectomy, adenoca breast-left MRM, reconstruction, chemo   Bronchitis last 2 weeks   saw dr Pete Glatter 02-28-2014, he said no antibiotics needed, nonproductive cough   Complication of anesthesia    Cystitis    cytoxen, had once or twice   Family history of breast cancer    History of breast cancer    History of radiation therapy 2/10, 2/12, 2/18, 2/24, 05/29/14   vaginal cuff/ 30 Gy/ 5 fx   Hypertension    Macular degeneration    Neck pain    taking physictal therapy last 2 weeks   Osteoporosis    On Prolia.   PONV (postoperative nausea and vomiting)    Shoulder pain    taking physical therapy for last few weeks   Uterine cancer ([facility]) 03/21/2014   MLH1/PMS2 LOH   Uterine fibroid    VAIN I (vaginal intraepithelial neoplasia grade I) 2018   positive HR HPV.   Vasovagal syncopes     Past Surgical History:  Procedure Laterality Date   BREAST IMPLANT REMOVAL  6/94   left breast   BREAST RECONSTRUCTION  1990   left   CATARACT EXTRACTION Bilateral    EXCISION VAGINAL CYST     MASTECTOMY Right 10/08/06   prophylactic   RADICAL MASTECTOMY LND  1990    left, chemo done   ROBOTIC ASSISTED TOTAL HYSTERECTOMY WITH BILATERAL SALPINGO OOPHERECTOMY Bilateral 03/21/2014   Procedure: ROBOTIC ASSISTED TOTAL HYSTERECTOMY WITH BILATERAL SALPINGO OOPHORECTOMY ;  Surgeon: Laurette Schimke, MD;  Location: [facility];  Service: Gynecology;  Laterality: Bilateral;    There were no vitals filed for this visit.   Subjective Assessment - 04/18/21 0932     Subjective Ive been having sciatic nerve pain. I saw the chiropractor yesterday and he said my muscle are tight in my Lt glute.                               [facility] Adult PT Treatment/Exercise - 04/18/21 0001       Lumbar Exercises: Stretches   Active Hamstring Stretch Left;3 reps    Active Hamstring Stretch Limitations standing at step      Lumbar Exercises: Sidelying   Clam 20 reps      Knee/Hip Exercises: Standing   Lateral Step Up 2 sets;10 reps;Hand Hold: 1;Step Height: 6"    Forward Step Up Left;2 sets;10 reps;Step Height: 6";Hand Hold: 1      Manual Therapy  Manual Therapy Soft tissue mobilization;Myofascial release    Manual therapy comments skilled palpation and monitoring with DN today.    Soft tissue mobilization elongation and trigger point release to bil lumbar spine and gluteals              Trigger Point Dry Needling - 04/18/21 0001     Consent Given? Yes    Education Handout Provided Previously provided    Muscles Treated Back/Hip Gluteus minimus;Gluteus medius;Lumbar multifidi    Gluteus Minimus Response Twitch response elicited;Palpable increased muscle length    Gluteus Medius Response Twitch response elicited;Palpable increased muscle length    Lumbar multifidi Response Twitch response elicited;Palpable increased muscle length                   PT Education - 04/18/21 0902     Education Details Access Code: [email or web address]    Person(s) Educated Patient    Methods Explanation;Demonstration;Handout    Comprehension Verbalized  understanding;Returned demonstration              PT Short Term Goals - 04/08/21 2054       PT SHORT TERM GOAL #1   Title be independent in initial HEP    Time 4    Period Weeks    Status New    Target Date 05/06/21      PT SHORT TERM GOAL #2   Title Patient to be able to walk 15 min without pain or need to stop and rest.    Time 4    Period Weeks    Status New    Target Date 05/06/21               PT Long Term Goals - 04/08/21 2102       PT LONG TERM GOAL #1   Title be independent in advanced HEP    Time 8    Period Weeks    Status New    Target Date 06/03/21      PT LONG TERM GOAL #2   Title Patient will report pain at no greater than 2/10    Time 8    Period Weeks    Status New    Target Date 06/03/21      PT LONG TERM GOAL #3   Title Patient will be able to resume walking program with her friends    Time 8    Period Weeks    Status New    Target Date 06/03/21      PT LONG TERM GOAL #4   Title FOTO to improve to 69    Time 8    Period Weeks    Status New    Target Date 06/03/21                   Plan - 04/18/21 0900     Clinical Impression Statement Pt reports continued Lt sciatic nerve pain after exercising.  Pt saw chiropractor yesterday for adjustment.  Pt requested exercise to work on the stability of the Lt LE.  PT added forward and side step-ups and sidelying clams to HEP.  Pt reports the dry needling was very helpful last week.  Pt had good response to DN with twitch response and improved tissue mobility after needling today Pt had no pain post session. Pt will continue to benefit from skilled PT to address LBP and Lt LE pain, strength, stability, and tissue mobility.    PT Frequency 2x / week  PT Duration 8 weeks    PT Treatment/Interventions ADLs/Self Care Home Management;Aquatic Therapy;Moist Heat;Iontophoresis 4mg /ml Dexamethasone;Electrical Stimulation;Cryotherapy;Ultrasound;Gait training;Therapeutic exercise;Therapeutic  activities;Functional mobility training;Stair training;Balance training;Neuromuscular re-education;Patient/family education;Manual techniques;Passive range of motion;Taping;Dry needling    PT Next Visit Plan progress core  strength/ trunk and hip stretches. DN when seen by PT    PT Home Exercise Plan Access Code: [email or web address]    Consulted and Agree with Plan of Care Patient             Patient will benefit from skilled therapeutic intervention in order to improve the following deficits and impairments:  Abnormal gait, Decreased balance, Decreased mobility, Difficulty walking, Decreased strength, Increased fascial restricitons, Impaired flexibility, Postural dysfunction, Pain  Visit Diagnosis: Abnormal posture  Muscle weakness (generalized)  Lumbar degenerative disc disease     Problem List Patient Active Problem List   Diagnosis Date Noted   Age-related osteoporosis without current pathological fracture 03/19/2020   Personal history of colonic polyps 03/19/2020   Transient cerebral ischemia 09/15/2017   Pure hypercholesterolemia 09/15/2017   Pulmonary nodule 09/15/2017   Hypertension 09/15/2017   Gastro-esophageal reflux disease without esophagitis 09/15/2017   VAIN I (vaginal intraepithelial neoplasia grade I) 06/23/2017   Genetic testing 05/23/2014   History of breast cancer    Family history of breast cancer    Endometrial cancer ([place]) 03/21/2014   Uterine cancer ([place]) 03/21/2014   S/P mastectomy, bilateral 05/02/2013   Situational stress 05/02/2013   Osteoporosis, unspecified 05/02/2013   Breast cancer, left breast ([place]) 05/02/2013    Sigurd Sos, PT 04/18/21 9:32 AM  [facility] @ [place], [place], [address] Phone: [phone]   Fax:  [phone]  Name: Beth Simon MRN: [id number] Date of Birth: 09-13-1940

## 2021-04-18 NOTE — Patient Instructions (Signed)
Access Code: [email or web address] URL: [email or web address] Date: 04/18/2021 Prepared by: Claiborne Billings  Exercises Standing Hamstring Stretch on Chair - 2 x daily - 7 x weekly - 1 sets - 3 reps - 30 sec hold  Beginner Clam - 1 x daily - 7 x weekly - 2 sets - 10 reps Forward Step Up with Counter Support - 1-2 x daily - 7 x weekly - 2 sets - 10 reps Lateral Step Up with Counter Support - 1-2 x daily - 7 x weekly - 2 sets - 10 reps

## 2021-04-22 ENCOUNTER — Other Ambulatory Visit: Payer: Self-pay

## 2021-04-22 ENCOUNTER — Encounter: Payer: Self-pay | Admitting: Physical Therapy

## 2021-04-22 ENCOUNTER — Ambulatory Visit: Payer: Medicare HMO | Admitting: Physical Therapy

## 2021-04-22 DIAGNOSIS — M859 Disorder of bone density and structure, unspecified: Secondary | ICD-10-CM | POA: Diagnosis not present

## 2021-04-22 DIAGNOSIS — M5136 Other intervertebral disc degeneration, lumbar region: Secondary | ICD-10-CM

## 2021-04-22 DIAGNOSIS — M6281 Muscle weakness (generalized): Secondary | ICD-10-CM

## 2021-04-22 DIAGNOSIS — Z7983 Long term (current) use of bisphosphonates: Secondary | ICD-10-CM | POA: Diagnosis not present

## 2021-04-22 DIAGNOSIS — R293 Abnormal posture: Secondary | ICD-10-CM

## 2021-04-22 NOTE — Therapy (Signed)
[facility] @ [place], [place], [address] Phone: [phone]   Fax:  [phone]  Physical Therapy Treatment  Patient Details  Name: Beth Simon MRN: [id number] Date of Birth: [birth year] Referring Provider (PT): Dr. Wandra Feinstein   Encounter Date: 04/22/2021   PT End of Session - 04/22/21 0900     Visit Number 5    Date for PT Re-Evaluation 06/03/21    Authorization Type Aetna Medicare    Progress Note Due on Visit 10    PT Start Time [phone]   pt late   PT Stop Time 0928    PT Time Calculation (min) 32 min    Activity Tolerance Patient tolerated treatment well    Behavior During Therapy [facility] for tasks assessed/performed             Past Medical History:  Diagnosis Date   Adenomatous polyps 01/2016   Anemia    Breast cancer ([place]) 1990   bilateral mastectomy, adenoca breast-left MRM, reconstruction, chemo   Bronchitis last 2 weeks   saw dr Felipa Eth 02-28-2014, he said no antibiotics needed, nonproductive cough   Complication of anesthesia    Cystitis    cytoxen, had once or twice   Family history of breast cancer    History of breast cancer    History of radiation therapy 2/10, 2/12, 2/18, 2/24, 05/29/14   vaginal cuff/ 30 Gy/ 5 fx   Hypertension    Macular degeneration    Neck pain    taking physictal therapy last 2 weeks   Osteoporosis    On Prolia.   PONV (postoperative nausea and vomiting)    Shoulder pain    taking physical therapy for last few weeks   Uterine cancer ([place]) 03/21/2014   MLH1/PMS2 LOH   Uterine fibroid    VAIN I (vaginal intraepithelial neoplasia grade I) 2018   positive HR HPV.   Vasovagal syncopes     Past Surgical History:  Procedure Laterality Date   BREAST IMPLANT REMOVAL  6/94   left breast   BREAST RECONSTRUCTION  1990   left   CATARACT EXTRACTION Bilateral    EXCISION VAGINAL CYST     MASTECTOMY Right 10/08/06   prophylactic   RADICAL MASTECTOMY LND   1990   left, chemo done   ROBOTIC ASSISTED TOTAL HYSTERECTOMY WITH BILATERAL SALPINGO OOPHERECTOMY Bilateral 03/21/2014   Procedure: ROBOTIC ASSISTED TOTAL HYSTERECTOMY WITH BILATERAL SALPINGO OOPHORECTOMY ;  Surgeon: Janie Morning, MD;  [facility];  Service: Gynecology;  Laterality: Bilateral;    There were no vitals filed for this visit.   Subjective Assessment - 04/22/21 0900     Subjective A little pain in my SI area but not too bad.    Pertinent History Htn, osteoporosis    Currently in Pain? Yes    Pain Score 2     Pain Location Buttocks    Pain Orientation Left    Pain Descriptors / Indicators Sore    Aggravating Factors  Standing and walking    Pain Relieving Factors Rest    Multiple Pain Sites No                               [facility] Adult PT Treatment/Exercise - 04/22/21 0001       Lumbar Exercises: Seated   Other Seated Lumbar Exercises green loop clamshells 10x, then sit to stand 10x with  band    Other Seated Lumbar Exercises green loop side stepping 6 lengths of sink      Lumbar Exercises: Supine   Other Supine Lumbar Exercises POST Pelvic tilt 10x 5 sec hold      Lumbar Exercises: Sidelying   Clam Left;20 reps    Clam Limitations yellow tband for 2nd set      Knee/Hip Exercises: Standing   Lateral Step Up Left;2 sets;10 reps;Hand Hold: 2;Step Height: 6"    Forward Step Up Both;2 sets;10 reps;Hand Hold: 2;Step Height: 6"    SLS On blue pod Bil 10 sec 3x                       PT Short Term Goals - 04/08/21 2054       PT SHORT TERM GOAL #1   Title be independent in initial HEP    Time 4    Period Weeks    Status New    Target Date 05/06/21      PT SHORT TERM GOAL #2   Title Patient to be able to walk 15 min without pain or need to stop and rest.    Time 4    Period Weeks    Status New    Target Date 05/06/21               PT Long Term Goals - 04/08/21 2102       PT LONG TERM GOAL #1   Title be  independent in advanced HEP    Time 8    Period Weeks    Status New    Target Date 06/03/21      PT LONG TERM GOAL #2   Title Patient will report pain at no greater than 2/10    Time 8    Period Weeks    Status New    Target Date 06/03/21      PT LONG TERM GOAL #3   Title Patient will be able to resume walking program with her friends    Time 8    Period Weeks    Status New    Target Date 06/03/21      PT LONG TERM GOAL #4   Title FOTO to improve to 69    Time 8    Period Weeks    Status New    Target Date 06/03/21                   Plan - 04/22/21 5784     Clinical Impression Statement pt arrives with mild pain around the LT SI area, denies hip pain or any radicular pain into her LT LE. Pt tolerated the addition of light resistance added today to her program for hip/buttocks strength. Pt reports "it feels good to put weight into my leg." Pt also tolerating the dry needling well, not as sore after.    Personal Factors and Comorbidities Age;Comorbidity 1;Comorbidity 2    Comorbidities Htn, osteoporosis    Examination-Activity Limitations Stand;Squat    Examination-Participation Restrictions Community Activity;Cleaning;Laundry    Stability/Clinical Decision Making Evolving/Moderate complexity    Rehab Potential Fair    PT Frequency 2x / week    PT Duration 8 weeks    PT Treatment/Interventions ADLs/Self Care Home Management;Aquatic Therapy;Moist Heat;Iontophoresis 53m/ml Dexamethasone;Electrical Stimulation;Cryotherapy;Ultrasound;Gait training;Therapeutic exercise;Therapeutic activities;Functional mobility training;Stair training;Balance training;Neuromuscular re-education;Patient/family education;Manual techniques;Passive range of motion;Taping;Dry needling    PT Next Visit Plan If pt tolerated the added light resistance today  add a few resistive exs to her HEP, DN next    PT Home Exercise Plan Access Code: [email or web address]    Consulted and Agree with Plan of Care Patient              Patient will benefit from skilled therapeutic intervention in order to improve the following deficits and impairments:  Abnormal gait, Decreased balance, Decreased mobility, Difficulty walking, Decreased strength, Increased fascial restricitons, Impaired flexibility, Postural dysfunction, Pain  Visit Diagnosis: Abnormal posture  Muscle weakness (generalized)  Lumbar degenerative disc disease     Problem List Patient Active Problem List   Diagnosis Date Noted   Age-related osteoporosis without current pathological fracture 03/19/2020   Personal history of colonic polyps 03/19/2020   Transient cerebral ischemia 09/15/2017   Pure hypercholesterolemia 09/15/2017   Pulmonary nodule 09/15/2017   Hypertension 09/15/2017   Gastro-esophageal reflux disease without esophagitis 09/15/2017   VAIN I (vaginal intraepithelial neoplasia grade I) 06/23/2017   Genetic testing 05/23/2014   History of breast cancer    Family history of breast cancer    Endometrial cancer ([place]) 03/21/2014   Uterine cancer ([place]) 03/21/2014   S/P mastectomy, bilateral 05/02/2013   Situational stress 05/02/2013   Osteoporosis, unspecified 05/02/2013   Breast cancer, left breast ([place]) 05/02/2013    Averlee Swartz, PTA 04/22/2021, 10:14 AM  [facility] @ [place], [place], [address] Phone: [phone]   Fax:  [phone]  Name: Beth Simon MRN: [id number] Date of Birth: September 27, 1940

## 2021-04-23 ENCOUNTER — Ambulatory Visit: Payer: Medicare HMO | Admitting: Podiatry

## 2021-04-25 ENCOUNTER — Ambulatory Visit: Payer: Medicare HMO

## 2021-04-25 ENCOUNTER — Other Ambulatory Visit: Payer: Self-pay

## 2021-04-25 DIAGNOSIS — M5136 Other intervertebral disc degeneration, lumbar region: Secondary | ICD-10-CM | POA: Diagnosis not present

## 2021-04-25 DIAGNOSIS — M6281 Muscle weakness (generalized): Secondary | ICD-10-CM

## 2021-04-25 DIAGNOSIS — R293 Abnormal posture: Secondary | ICD-10-CM

## 2021-04-25 DIAGNOSIS — R300 Dysuria: Secondary | ICD-10-CM | POA: Diagnosis not present

## 2021-04-25 DIAGNOSIS — Z7983 Long term (current) use of bisphosphonates: Secondary | ICD-10-CM | POA: Diagnosis not present

## 2021-04-25 DIAGNOSIS — M859 Disorder of bone density and structure, unspecified: Secondary | ICD-10-CM | POA: Diagnosis not present

## 2021-04-25 NOTE — Therapy (Signed)
[facility] @ [place], [place], [address] Phone: [phone]   Fax:  [phone]  Physical Therapy Treatment  Patient Details  Name: Beth Simon MRN: [id number] Date of Birth: [birth year] Referring Provider (PT): Dr. Wandra Feinstein   Encounter Date: 04/25/2021   PT End of Session - 04/25/21 0927     Visit Number 6    Date for PT Re-Evaluation 06/03/21    Authorization Type [employer]    Progress Note Due on Visit 10    PT Start Time 0850    PT Stop Time 0928    PT Time Calculation (min) 38 min    Activity Tolerance Patient tolerated treatment well    Behavior During Therapy [facility] for tasks assessed/performed             Past Medical History:  Diagnosis Date   Adenomatous polyps 01/2016   Anemia    Breast cancer ([place]) 1990   bilateral mastectomy, adenoca breast-left MRM, reconstruction, chemo   Bronchitis last 2 weeks   saw dr Felipa Eth 02-28-2014, he said no antibiotics needed, nonproductive cough   Complication of anesthesia    Cystitis    cytoxen, had once or twice   Family history of breast cancer    History of breast cancer    History of radiation therapy 2/10, 2/12, 2/18, 2/24, 05/29/14   vaginal cuff/ 30 Gy/ 5 fx   Hypertension    Macular degeneration    Neck pain    taking physictal therapy last 2 weeks   Osteoporosis    On Prolia.   PONV (postoperative nausea and vomiting)    Shoulder pain    taking physical therapy for last few weeks   Uterine cancer ([place]) 03/21/2014   MLH1/PMS2 LOH   Uterine fibroid    VAIN I (vaginal intraepithelial neoplasia grade I) 2018   positive HR HPV.   Vasovagal syncopes     Past Surgical History:  Procedure Laterality Date   BREAST IMPLANT REMOVAL  6/94   left breast   BREAST RECONSTRUCTION  1990   left   CATARACT EXTRACTION Bilateral    EXCISION VAGINAL CYST     MASTECTOMY Right 10/08/06   prophylactic   RADICAL MASTECTOMY LND  1990    left, chemo done   ROBOTIC ASSISTED TOTAL HYSTERECTOMY WITH BILATERAL SALPINGO OOPHERECTOMY Bilateral 03/21/2014   Procedure: ROBOTIC ASSISTED TOTAL HYSTERECTOMY WITH BILATERAL SALPINGO OOPHORECTOMY ;  Surgeon: Janie Morning, MD;  Location: [facility];  Service: Gynecology;  Laterality: Bilateral;    There were no vitals filed for this visit.   Subjective Assessment - 04/25/21 0852     Subjective I don't want to do the NuStep today- it hurts my shoulders and knees. I went to the chiropractor.    Pertinent History Htn, osteoporosis    Patient Stated Goals To gain control of her back and hip pain and be able to resume her prior level of function    Currently in Pain? Yes    Pain Score 1     Pain Location Buttocks    Pain Orientation Left                               [facility] Adult PT Treatment/Exercise - 04/25/21 0001       Lumbar Exercises: Seated   Other Seated Lumbar Exercises green loop clamshells 10x, then sit to stand 10x with band  Knee/Hip Exercises: Standing   Hip Abduction Stengthening;Both;10 reps;3 sets;Knee straight    Lateral Step Up Left;2 sets;10 reps;Hand Hold: 2;Step Height: 6"    Forward Step Up Both;2 sets;10 reps;Hand Hold: 2;Step Height: 6"      Manual Therapy   Manual Therapy Soft tissue mobilization;Myofascial release    Manual therapy comments skilled palpation and monitoring with DN today.    Soft tissue mobilization elongation and trigger point release to bil lumbar spine and gluteals              Trigger Point Dry Needling - 04/25/21 0001     Consent Given? Yes    Education Handout Provided Previously provided    Muscles Treated Back/Hip Gluteus minimus;Gluteus medius;Lumbar multifidi    Gluteus Minimus Response Twitch response elicited;Palpable increased muscle length    Gluteus Medius Response Twitch response elicited;Palpable increased muscle length    Lumbar multifidi Response Twitch response elicited;Palpable increased  muscle length                     PT Short Term Goals - 04/08/21 2054       PT SHORT TERM GOAL #1   Title be independent in initial HEP    Time 4    Period Weeks    Status New    Target Date 05/06/21      PT SHORT TERM GOAL #2   Title Patient to be able to walk 15 min without pain or need to stop and rest.    Time 4    Period Weeks    Status New    Target Date 05/06/21               PT Long Term Goals - 04/08/21 2102       PT LONG TERM GOAL #1   Title be independent in advanced HEP    Time 8    Period Weeks    Status New    Target Date 06/03/21      PT LONG TERM GOAL #2   Title Patient will report pain at no greater than 2/10    Time 8    Period Weeks    Status New    Target Date 06/03/21      PT LONG TERM GOAL #3   Title Patient will be able to resume walking program with her friends    Time 8    Period Weeks    Status New    Target Date 06/03/21      PT LONG TERM GOAL #4   Title FOTO to improve to 69    Time 8    Period Weeks    Status New    Target Date 06/03/21                   Plan - 04/25/21 0900     Clinical Impression Statement Pt reports that she is better overall now that sciatic nerve pain has reduced. Pain is 1-2/10 today in the Lt gluteals.  Pt tolerated the addition of light resistance this week to her program for hip/buttocks strength. Pt with good response to DN with twitch response and improved mobility after DN and manual therapy.  Pt will continue to benefit from skilled PT to address lumbopelvic strength, flexibility and tissue mobility to address.    PT Frequency 2x / week    PT Duration 8 weeks    PT Treatment/Interventions ADLs/Self Care Home Management;Aquatic Therapy;Moist Heat;Iontophoresis 53m/ml Dexamethasone;Electrical  Stimulation;Cryotherapy;Ultrasound;Gait training;Therapeutic exercise;Therapeutic activities;Functional mobility training;Stair training;Balance training;Neuromuscular  re-education;Patient/family education;Manual techniques;Passive range of motion;Taping;Dry needling    PT Next Visit Plan If pt tolerated the added light resistance today add a few resistive exs to her HEP, DN next    PT Home Exercise Plan Access Code: [email or web address]    Consulted and Agree with Plan of Care Patient             Patient will benefit from skilled therapeutic intervention in order to improve the following deficits and impairments:  Abnormal gait, Decreased balance, Decreased mobility, Difficulty walking, Decreased strength, Increased fascial restricitons, Impaired flexibility, Postural dysfunction, Pain  Visit Diagnosis: Abnormal posture  Muscle weakness (generalized)  Lumbar degenerative disc disease     Problem List Patient Active Problem List   Diagnosis Date Noted   Age-related osteoporosis without current pathological fracture 03/19/2020   Personal history of colonic polyps 03/19/2020   Transient cerebral ischemia 09/15/2017   Pure hypercholesterolemia 09/15/2017   Pulmonary nodule 09/15/2017   Hypertension 09/15/2017   Gastro-esophageal reflux disease without esophagitis 09/15/2017   VAIN I (vaginal intraepithelial neoplasia grade I) 06/23/2017   Genetic testing 05/23/2014   History of breast cancer    Family history of breast cancer    Endometrial cancer ([place]) 03/21/2014   Uterine cancer ([place]) 03/21/2014   S/P mastectomy, bilateral 05/02/2013   Situational stress 05/02/2013   Osteoporosis, unspecified 05/02/2013   Breast cancer, left breast ([place]) 05/02/2013    Sigurd Sos, PT 04/25/21 9:30 AM  [facility] @ [place], [place], [address] Phone: [phone]   Fax:  [phone]  Name: EVALINA TABAK MRN: [id number] Date of Birth: 11-11-1940

## 2021-04-29 ENCOUNTER — Ambulatory Visit: Payer: Medicare HMO | Admitting: Physical Therapy

## 2021-05-06 ENCOUNTER — Ambulatory Visit: Payer: Medicare HMO | Attending: Sports Medicine

## 2021-05-06 ENCOUNTER — Other Ambulatory Visit: Payer: Self-pay

## 2021-05-06 DIAGNOSIS — R293 Abnormal posture: Secondary | ICD-10-CM | POA: Diagnosis not present

## 2021-05-06 DIAGNOSIS — R262 Difficulty in walking, not elsewhere classified: Secondary | ICD-10-CM | POA: Insufficient documentation

## 2021-05-06 DIAGNOSIS — M6281 Muscle weakness (generalized): Secondary | ICD-10-CM | POA: Diagnosis not present

## 2021-05-06 DIAGNOSIS — M51369 Other intervertebral disc degeneration, lumbar region without mention of lumbar back pain or lower extremity pain: Secondary | ICD-10-CM

## 2021-05-06 DIAGNOSIS — M5136 Other intervertebral disc degeneration, lumbar region: Secondary | ICD-10-CM | POA: Insufficient documentation

## 2021-05-06 NOTE — Therapy (Signed)
[facility] @ [place], [place], [address] Phone: [phone]   Fax:  [phone]  Physical Therapy Treatment  Patient Details  Name: Beth Simon MRN: [id number] Date of Birth: October 23, 1940 Referring Provider (PT): Dr. Wandra Feinstein   Encounter Date: 05/06/2021   PT End of Session - 05/06/21 1055     Visit Number 7    Date for PT Re-Evaluation 06/03/21    Authorization Type [employer] Medicare    Progress Note Due on Visit 10    PT Start Time 1018    PT Stop Time 1056    PT Time Calculation (min) 38 min    Activity Tolerance Patient tolerated treatment well    Behavior During Therapy [facility] for tasks assessed/performed             Past Medical History:  Diagnosis Date   Adenomatous polyps 01/2016   Anemia    Breast cancer ([place]) 1990   bilateral mastectomy, adenoca breast-left MRM, reconstruction, chemo   Bronchitis last 2 weeks   saw dr Felipa Eth 02-28-2014, he said no antibiotics needed, nonproductive cough   Complication of anesthesia    Cystitis    cytoxen, had once or twice   Family history of breast cancer    History of breast cancer    History of radiation therapy 2/10, 2/12, 2/18, 2/24, 05/29/14   vaginal cuff/ 30 Gy/ 5 fx   Hypertension    Macular degeneration    Neck pain    taking physictal therapy last 2 weeks   Osteoporosis    On Prolia.   PONV (postoperative nausea and vomiting)    Shoulder pain    taking physical therapy for last few weeks   Uterine cancer ([place]) 03/21/2014   MLH1/PMS2 LOH   Uterine fibroid    VAIN I (vaginal intraepithelial neoplasia grade I) 2018   positive HR HPV.   Vasovagal syncopes     Past Surgical History:  Procedure Laterality Date   BREAST IMPLANT REMOVAL  6/94   left breast   BREAST RECONSTRUCTION  1990   left   CATARACT EXTRACTION Bilateral    EXCISION VAGINAL CYST     MASTECTOMY Right 10/08/06   prophylactic   RADICAL MASTECTOMY LND  1990    left, chemo done   ROBOTIC ASSISTED TOTAL HYSTERECTOMY WITH BILATERAL SALPINGO OOPHERECTOMY Bilateral 03/21/2014   Procedure: ROBOTIC ASSISTED TOTAL HYSTERECTOMY WITH BILATERAL SALPINGO OOPHORECTOMY ;  Surgeon: Janie Morning, MD;  Location: [facility];  Service: Gynecology;  Laterality: Bilateral;    There were no vitals filed for this visit.   Subjective Assessment - 05/06/21 1022     Subjective I stayed home last week because I was exposed to covid.  I feel like the injection that I got in my Lt hip is wearing off.    Currently in Pain? Yes    Pain Score 2     Pain Descriptors / Indicators Sore;Aching    Pain Type Chronic pain    Pain Onset More than a month ago    Pain Frequency Intermittent    Aggravating Factors  standing and walking    Pain Relieving Factors rest, exercise                               [facility] Adult PT Treatment/Exercise - 05/06/21 0001       Lumbar Exercises: Seated   Other Seated Lumbar  Exercises green loop clamshells 10x, then sit to stand 10x with band      Lumbar Exercises: Supine   Ab Set 20 reps    AB Set Limitations with ball squeeze    Clam 20 reps    Clam Limitations with core engagement      Knee/Hip Exercises: Standing   Hip Abduction Stengthening;Both;10 reps;3 sets;Knee straight    Abduction Limitations standing on pad    Hip Extension Stengthening;2 sets;10 reps    Extension Limitations standing on pad    Forward Step Up Both;2 sets;10 reps;Hand Hold: 2;Step Height: 6"    Forward Step Up Limitations up and down steps without UE support      Manual Therapy   Manual Therapy Soft tissue mobilization;Myofascial release    Manual therapy comments skilled palpation and monitoring with DN today.    Soft tissue mobilization elongation and trigger point release to bil lumbar spine and gluteals              Trigger Point Dry Needling - 05/06/21 0001     Consent Given? Yes    Education Handout Provided Previously  provided    Muscles Treated Back/Hip Gluteus minimus;Gluteus medius;Lumbar multifidi    Gluteus Minimus Response Twitch response elicited;Palpable increased muscle length    Gluteus Medius Response Twitch response elicited;Palpable increased muscle length    Lumbar multifidi Response Twitch response elicited;Palpable increased muscle length                     PT Short Term Goals - 04/08/21 2054       PT SHORT TERM GOAL #1   Title be independent in initial HEP    Time 4    Period Weeks    Status New    Target Date 05/06/21      PT SHORT TERM GOAL #2   Title Patient to be able to walk 15 min without pain or need to stop and rest.    Time 4    Period Weeks    Status New    Target Date 05/06/21               PT Long Term Goals - 05/06/21 1035       PT LONG TERM GOAL #1   Title be independent in advanced HEP    Status On-going      PT LONG TERM GOAL #2   Title Patient will report pain at no greater than 2/10    Baseline up to 3/10    Status On-going      PT LONG TERM GOAL #3   Title Patient will be able to resume walking program with her friends    Baseline hasn't walked due to friend not available    Status On-going      PT LONG TERM GOAL #4   Baseline --      PT LONG TERM GOAL #5   Title --                   Plan - 05/06/21 1028     Clinical Impression Statement Pt was not able to attend last week due to illness and exposure to covid.  Pt reports that she is better overall now that sciatic nerve pain has reduced. Pain is 1-2/10 today in the Lt gluteals.  Pt did well with addition of balance pad with standing exercises to address stability on single limb. Pt reports 3/10 max Lt gluteal pain, progress  toward goal. Pt with good response to DN with twitch response and improved mobility after DN and manual therapy.  Pt will continue to benefit from skilled PT to address lumbopelvic strength, flexibility and tissue mobility to address.    PT  Frequency 2x / week    PT Duration 8 weeks    PT Treatment/Interventions ADLs/Self Care Home Management;Aquatic Therapy;Moist Heat;Iontophoresis 4mg /ml Dexamethasone;Electrical Stimulation;Cryotherapy;Ultrasound;Gait training;Therapeutic exercise;Therapeutic activities;Functional mobility training;Stair training;Balance training;Neuromuscular re-education;Patient/family education;Manual techniques;Passive range of motion;Taping;Dry needling    PT Next Visit Plan Continue to advance hip and core strength, DN as needed    PT Home Exercise Plan Access Code: [email or web address]    Consulted and Agree with Plan of Care Patient             Patient will benefit from skilled therapeutic intervention in order to improve the following deficits and impairments:  Abnormal gait, Decreased balance, Decreased mobility, Difficulty walking, Decreased strength, Increased fascial restricitons, Impaired flexibility, Postural dysfunction, Pain  Visit Diagnosis: Abnormal posture  Muscle weakness (generalized)  Lumbar degenerative disc disease     Problem List Patient Active Problem List   Diagnosis Date Noted   Age-related osteoporosis without current pathological fracture 03/19/2020   Personal history of colonic polyps 03/19/2020   Transient cerebral ischemia 09/15/2017   Pure hypercholesterolemia 09/15/2017   Pulmonary nodule 09/15/2017   Hypertension 09/15/2017   Gastro-esophageal reflux disease without esophagitis 09/15/2017   VAIN I (vaginal intraepithelial neoplasia grade I) 06/23/2017   Genetic testing 05/23/2014   History of breast cancer    Family history of breast cancer    Endometrial cancer ([place]) 03/21/2014   Uterine cancer ([place]) 03/21/2014   S/P mastectomy, bilateral 05/02/2013   Situational stress 05/02/2013   Osteoporosis, unspecified 05/02/2013   Breast cancer, left breast ([place]) 05/02/2013   Sigurd Sos, PT 05/06/21 10:57 AM   [facility] @  [place], [place], [address] Phone: [phone]   Fax:  [phone]  Name: Beth Simon MRN: [id number] Date of Birth: 11/02/1940

## 2021-05-07 ENCOUNTER — Ambulatory Visit: Payer: Medicare HMO | Admitting: Podiatry

## 2021-05-07 DIAGNOSIS — Q828 Other specified congenital malformations of skin: Secondary | ICD-10-CM | POA: Diagnosis not present

## 2021-05-07 DIAGNOSIS — R252 Cramp and spasm: Secondary | ICD-10-CM | POA: Diagnosis not present

## 2021-05-07 DIAGNOSIS — M79676 Pain in unspecified toe(s): Secondary | ICD-10-CM

## 2021-05-07 DIAGNOSIS — B351 Tinea unguium: Secondary | ICD-10-CM

## 2021-05-09 ENCOUNTER — Ambulatory Visit: Payer: Medicare HMO

## 2021-05-09 ENCOUNTER — Other Ambulatory Visit: Payer: Self-pay

## 2021-05-09 DIAGNOSIS — M6281 Muscle weakness (generalized): Secondary | ICD-10-CM | POA: Diagnosis not present

## 2021-05-09 DIAGNOSIS — M5136 Other intervertebral disc degeneration, lumbar region: Secondary | ICD-10-CM

## 2021-05-09 DIAGNOSIS — R293 Abnormal posture: Secondary | ICD-10-CM

## 2021-05-09 DIAGNOSIS — R262 Difficulty in walking, not elsewhere classified: Secondary | ICD-10-CM | POA: Diagnosis not present

## 2021-05-09 NOTE — Therapy (Signed)
[facility] @ [place], [place], [address] Phone: [phone]   Fax:  [phone]  Physical Therapy Treatment  Patient Details  Name: Beth Simon MRN: [id number] Date of Birth: [birth year] Referring Provider (PT): Dr. Wandra Feinstein   Encounter Date: 05/09/2021   PT End of Session - 05/09/21 0929     Visit Number 8    Date for PT Re-Evaluation 06/03/21    Authorization Type [employer]na Medicare    Progress Note Due on Visit 10    PT Start Time 0850    PT Stop Time 0929    PT Time Calculation (min) 39 min    Activity Tolerance Patient tolerated treatment well    Behavior During Therapy [facility] for tasks assessed/performed             Past Medical History:  Diagnosis Date   Adenomatous polyps 01/2016   Anemia    Breast cancer ([place]) 1990   bilateral mastectomy, adenoca breast-left MRM, reconstruction, chemo   Bronchitis last 2 weeks   saw dr Felipa Eth 02-28-2014, he said no antibiotics needed, nonproductive cough   Complication of anesthesia    Cystitis    cytoxen, had once or twice   Family history of breast cancer    History of breast cancer    History of radiation therapy 2/10, 2/12, 2/18, 2/24, 05/29/14   vaginal cuff/ 30 Gy/ 5 fx   Hypertension    Macular degeneration    Neck pain    taking physictal therapy last 2 weeks   Osteoporosis    On Prolia.   PONV (postoperative nausea and vomiting)    Shoulder pain    taking physical therapy for last few weeks   Uterine cancer ([place]) 03/21/2014   MLH1/PMS2 LOH   Uterine fibroid    VAIN I (vaginal intraepithelial neoplasia grade I) 2018   positive HR HPV.   Vasovagal syncopes     Past Surgical History:  Procedure Laterality Date   BREAST IMPLANT REMOVAL  6/94   left breast   BREAST RECONSTRUCTION  1990   left   CATARACT EXTRACTION Bilateral    EXCISION VAGINAL CYST     MASTECTOMY Right 10/08/06   prophylactic   RADICAL MASTECTOMY LND  1990    left, chemo done   ROBOTIC ASSISTED TOTAL HYSTERECTOMY WITH BILATERAL SALPINGO OOPHERECTOMY Bilateral 03/21/2014   Procedure: ROBOTIC ASSISTED TOTAL HYSTERECTOMY WITH BILATERAL SALPINGO OOPHORECTOMY ;  Surgeon: Janie Morning, MD;  [facility];  Service: Gynecology;  Laterality: Bilateral;    There were no vitals filed for this visit.   Subjective Assessment - 05/09/21 0852     Subjective I feel off right now.  My back is really aching right now.    Patient Stated Goals To gain control of her back and hip pain and be able to resume her prior level of function    Currently in Pain? Yes    Pain Location Buttocks    Pain Orientation Right;Left    Pain Descriptors / Indicators Aching;Sore    Pain Type Chronic pain    Pain Onset More than a month ago    Pain Frequency Intermittent    Aggravating Factors  standing and walking    Pain Relieving Factors ice, rest                               [facility] Adult PT Treatment/Exercise -  05/09/21 0001       Lumbar Exercises: Seated   Other Seated Lumbar Exercises green loop clamshells 10x, then sit to stand 10x with band      Lumbar Exercises: Supine   Ab Set 20 reps    AB Set Limitations with ball squeeze    Clam 20 reps    Clam Limitations with core engagement      Lumbar Exercises: Sidelying   Clam Left;20 reps      Knee/Hip Exercises: Standing   Hip Abduction Stengthening;Both;10 reps;3 sets;Knee straight    Abduction Limitations standing on pad    Hip Extension Stengthening;2 sets;10 reps    Extension Limitations standing on pad    Forward Step Up Both;2 sets;10 reps;Hand Hold: 2;Step Height: 6"    Forward Step Up Limitations up and down steps without UE support      Manual Therapy   Manual Therapy Soft tissue mobilization;Myofascial release    Soft tissue mobilization Addaday to bil gluteals and lumbar spine                       PT Short Term Goals - 04/08/21 2054       PT SHORT TERM  GOAL #1   Title be independent in initial HEP    Time 4    Period Weeks    Status New    Target Date 05/06/21      PT SHORT TERM GOAL #2   Title Patient to be able to walk 15 min without pain or need to stop and rest.    Time 4    Period Weeks    Status New    Target Date 05/06/21               PT Long Term Goals - 05/06/21 1035       PT LONG TERM GOAL #1   Title be independent in advanced HEP    Status On-going      PT LONG TERM GOAL #2   Title Patient will report pain at no greater than 2/10    Baseline up to 3/10    Status On-going      PT LONG TERM GOAL #3   Title Patient will be able to resume walking program with her friends    Baseline hasn't walked due to friend not available    Status On-going      PT LONG TERM GOAL #4   Baseline --      PT LONG TERM GOAL #5   Title --                   Plan - 05/09/21 0906     Clinical Impression Statement Pt arrived today with LBP and reports that it is limiting her ability to fall asleep.  Pt reports that she is better overall now that sciatic nerve pain has reduced.  Pt was agreeable to exercise for core mobility and gluteal strength.  Pt had some LE pain with single limb activity and this was brief.  Pt had good response to addaday for lumbar tissue mobility.   Pt will continue to benefit from skilled PT to address lumbopelvic strength, flexibility and tissue mobility to address.    PT Frequency 2x / week    PT Duration 8 weeks    PT Treatment/Interventions ADLs/Self Care Home Management;Aquatic Therapy;Moist Heat;Iontophoresis 4mg /ml Dexamethasone;Electrical Stimulation;Cryotherapy;Ultrasound;Gait training;Therapeutic exercise;Therapeutic activities;Functional mobility training;Stair training;Balance training;Neuromuscular re-education;Patient/family education;Manual techniques;Passive range of motion;Taping;Dry needling  PT Next Visit Plan Continue to advance hip and core strength, DN as needed    PT  Home Exercise Plan Access Code: [email or web address]    Consulted and Agree with Plan of Care Patient             Patient will benefit from skilled therapeutic intervention in order to improve the following deficits and impairments:  Abnormal gait, Decreased balance, Decreased mobility, Difficulty walking, Decreased strength, Increased fascial restricitons, Impaired flexibility, Postural dysfunction, Pain  Visit Diagnosis: Abnormal posture  Muscle weakness (generalized)  Lumbar degenerative disc disease     Problem List Patient Active Problem List   Diagnosis Date Noted   Age-related osteoporosis without current pathological fracture 03/19/2020   Personal history of colonic polyps 03/19/2020   Transient cerebral ischemia 09/15/2017   Pure hypercholesterolemia 09/15/2017   Pulmonary nodule 09/15/2017   Hypertension 09/15/2017   Gastro-esophageal reflux disease without esophagitis 09/15/2017   VAIN I (vaginal intraepithelial neoplasia grade I) 06/23/2017   Genetic testing 05/23/2014   History of breast cancer    Family history of breast cancer    Endometrial cancer ([place]) 03/21/2014   Uterine cancer ([place]) 03/21/2014   S/P mastectomy, bilateral 05/02/2013   Situational stress 05/02/2013   Osteoporosis, unspecified 05/02/2013   Breast cancer, left breast ([place]) 05/02/2013    Sigurd Sos, PT 05/09/21 9:30 AM   [facility] @ [place], [place], [address] Phone: [phone]   Fax:  [phone]  Name: Beth Simon MRN: [id number] Date of Birth: 1940/05/31

## 2021-05-11 NOTE — Progress Notes (Signed)
Subjective: 81 y.o. returns the office today for painful, elongated, thickened toenails which she is unable to trim herself as well as for a callus to her second digit on her left foot.  She said this started to become painful again.  She has had some pain in her left fourth toe nails and calluses but she denies any bleeding or any swelling or redness.  The cramping on the right leg that she is experienced has been doing well and not had any significant worsening and has been doing better.  No other changes.    Objective: AAO 3, NAD DP/PT pulses palpable, CRT less than 3 seconds Nails hypertrophic, dystrophic, elongated, brittle, discolored 10. There is tenderness overlying the nails 1-5 bilaterally. There is no surrounding erythema or drainage along the nail sites.  Continuation incurvation to the lesser digit toenails left side worse than right.   Hyperkeratotic lesions of the distal aspect left second digit. No underlying ulceration, drainage or other signs of infection.  No other areas of discomfort today. No pain with calf compression, swelling, warmth, erythema.  Assessment: Symptomatic onychomycosis; hyperkeratotic lesion left foot; right leg cramping  Plan: -Treatment options including alternatives, risks, complications were discussed -Nails sharply debrided 10 without any complications or bleeding.  -Hyperkeratotic lesion sharply debrided 1 without complication/bleeding. -Previous been doing well.  Continue stretching as well as supportive shoe gear. -Monitor for any clinical signs or symptoms of infection and directed to call the office immediately should any occur or go to the ER.  Celesta Gentile, DPM

## 2021-05-13 ENCOUNTER — Other Ambulatory Visit: Payer: Self-pay

## 2021-05-13 ENCOUNTER — Ambulatory Visit: Payer: Medicare HMO | Admitting: Physical Therapy

## 2021-05-13 ENCOUNTER — Encounter: Payer: Self-pay | Admitting: Physical Therapy

## 2021-05-13 DIAGNOSIS — M6281 Muscle weakness (generalized): Secondary | ICD-10-CM

## 2021-05-13 DIAGNOSIS — M5136 Other intervertebral disc degeneration, lumbar region: Secondary | ICD-10-CM | POA: Diagnosis not present

## 2021-05-13 DIAGNOSIS — I10 Essential (primary) hypertension: Secondary | ICD-10-CM | POA: Diagnosis not present

## 2021-05-13 DIAGNOSIS — R293 Abnormal posture: Secondary | ICD-10-CM | POA: Diagnosis not present

## 2021-05-13 DIAGNOSIS — R262 Difficulty in walking, not elsewhere classified: Secondary | ICD-10-CM

## 2021-05-13 DIAGNOSIS — K219 Gastro-esophageal reflux disease without esophagitis: Secondary | ICD-10-CM | POA: Diagnosis not present

## 2021-05-13 DIAGNOSIS — E611 Iron deficiency: Secondary | ICD-10-CM | POA: Diagnosis not present

## 2021-05-13 NOTE — Therapy (Signed)
[facility] @ [facility] [address] [place], [place], [address] Phone: [phone]   Fax:  [phone]  Physical Therapy Treatment  Patient Details  Name: Beth Simon MRN: [id number] Date of Birth: 02-10-1941 Referring Provider (PT): Dr. Albertha Ghee   Encounter Date: 05/13/2021   PT End of Session - 05/13/21 0850     Visit Number 9    Date for PT Re-Evaluation 06/03/21    Authorization Type Aetna Medicare    Progress Note Due on Visit 10    PT Start Time 0850    PT Stop Time 0929    PT Time Calculation (min) 39 min    Activity Tolerance Patient tolerated treatment well    Behavior During Therapy [facility] for tasks assessed/performed             Past Medical History:  Diagnosis Date   Adenomatous polyps 01/2016   Anemia    Breast cancer (HCC) 1990   bilateral mastectomy, adenoca breast-left MRM, reconstruction, chemo   Bronchitis last 2 weeks   saw dr Pete Glatter 02-28-2014, he said no antibiotics needed, nonproductive cough   Complication of anesthesia    Cystitis    cytoxen, had once or twice   Family history of breast cancer    History of breast cancer    History of radiation therapy 2/10, 2/12, 2/18, 2/24, 05/29/14   vaginal cuff/ 30 Gy/ 5 fx   Hypertension    Macular degeneration    Neck pain    taking physictal therapy last 2 weeks   Osteoporosis    On Prolia.   PONV (postoperative nausea and vomiting)    Shoulder pain    taking physical therapy for last few weeks   Uterine cancer (HCC) 03/21/2014   MLH1/PMS2 LOH   Uterine fibroid    VAIN I (vaginal intraepithelial neoplasia grade I) 2018   positive HR HPV.   Vasovagal syncopes     Past Surgical History:  Procedure Laterality Date   BREAST IMPLANT REMOVAL  6/94   left breast   BREAST RECONSTRUCTION  1990   left   CATARACT EXTRACTION Bilateral    EXCISION VAGINAL CYST     MASTECTOMY Right 10/08/06   prophylactic   RADICAL MASTECTOMY LND  1990    left, chemo done   ROBOTIC ASSISTED TOTAL HYSTERECTOMY WITH BILATERAL SALPINGO OOPHERECTOMY Bilateral 03/21/2014   Procedure: ROBOTIC ASSISTED TOTAL HYSTERECTOMY WITH BILATERAL SALPINGO OOPHORECTOMY ;  Surgeon: Laurette Schimke, MD;  Location: [facility];  Service: Gynecology;  Laterality: Bilateral;    There were no vitals filed for this visit.   Subjective Assessment - 05/13/21 0853     Subjective Pt having intermittent sciatic symptoms in both legs.    Currently in Pain? Yes    Pain Score 1     Pain Location Buttocks    Pain Orientation Left    Pain Descriptors / Indicators Dull;Sore    Multiple Pain Sites No                               [facility] Adult PT Treatment/Exercise - 05/13/21 0001       Lumbar Exercises: Stretches   Active Hamstring Stretch Left;Right;3 reps;20 seconds    Active Hamstring Stretch Limitations Seated    Other Lumbar Stretch Exercise Knee lunges on second step 10x Bil f/b forward step ups 10x2      Lumbar Exercises: Standing  Other Standing Lumbar Exercises Reciprocal stairs 4 setps 3x      Lumbar Exercises: Seated   Long Arc Quad on Chair Strengthening;Both;2 sets;10 reps;Weights    LAQ on Chair Weights (lbs) 2.5    LAQ on Chair Limitations lumbar support given    Other Seated Lumbar Exercises yellow loop clamshells 2x10    Other Seated Lumbar Exercises Seated cat/cow 10x                       PT Short Term Goals - 04/08/21 2054       PT SHORT TERM GOAL #1   Title be independent in initial HEP    Time 4    Period Weeks    Status New    Target Date 05/06/21      PT SHORT TERM GOAL #2   Title Patient to be able to walk 15 min without pain or need to stop and rest.    Time 4    Period Weeks    Status New    Target Date 05/06/21               PT Long Term Goals - 05/06/21 1035       PT LONG TERM GOAL #1   Title be independent in advanced HEP    Status On-going      PT LONG TERM GOAL #2   Title  Patient will report pain at no greater than 2/10    Baseline up to 3/10    Status On-going      PT LONG TERM GOAL #3   Title Patient will be able to resume walking program with her friends    Baseline hasn't walked due to friend not available    Status On-going      PT LONG TERM GOAL #4   Baseline --      PT LONG TERM GOAL #5   Title --                   Plan - 05/13/21 0854     Clinical Impression Statement Pt was able to drive 1.5 hrs with no Rt ankle cramps but an ache was definitely present. Lt hip has been doing better but pt does report intermittent sciatic pains in both LE. Pt was given seated cat/cow today to work on her lumbar AROM. No pain during todays session. Added resistance for quad strength and progressed band strength on her clamshells.    Personal Factors and Comorbidities Age;Comorbidity 1;Comorbidity 2    Comorbidities Htn, osteoporosis    Examination-Activity Limitations Stand;Squat    Examination-Participation Restrictions Community Activity;Cleaning;Laundry    Stability/Clinical Decision Making Evolving/Moderate complexity    Rehab Potential Fair    PT Frequency 2x / week    PT Duration 8 weeks    PT Treatment/Interventions ADLs/Self Care Home Management;Aquatic Therapy;Moist Heat;Iontophoresis 4mg /ml Dexamethasone;Electrical Stimulation;Cryotherapy;Ultrasound;Gait training;Therapeutic exercise;Therapeutic activities;Functional mobility training;Stair training;Balance training;Neuromuscular re-education;Patient/family education;Manual techniques;Passive range of motion;Taping;Dry needling    PT Next Visit Plan Continue to advance hip and core strength, DN as needed    PT Home Exercise Plan Access Code: [email or web address]    Consulted and Agree with Plan of Care Patient             Patient will benefit from skilled therapeutic intervention in order to improve the following deficits and impairments:  Abnormal gait, Decreased balance, Decreased mobility,  Difficulty walking, Decreased strength, Increased fascial restricitons, Impaired flexibility, Postural dysfunction, Pain  Visit  Diagnosis: Abnormal posture  Muscle weakness (generalized)  Lumbar degenerative disc disease  Difficulty in walking, not elsewhere classified     Problem List Patient Active Problem List   Diagnosis Date Noted   Age-related osteoporosis without current pathological fracture 03/19/2020   Personal history of colonic polyps 03/19/2020   Transient cerebral ischemia 09/15/2017   Pure hypercholesterolemia 09/15/2017   Pulmonary nodule 09/15/2017   Hypertension 09/15/2017   Gastro-esophageal reflux disease without esophagitis 09/15/2017   VAIN I (vaginal intraepithelial neoplasia grade I) 06/23/2017   Genetic testing 05/23/2014   History of breast cancer    Family history of breast cancer    Endometrial cancer ([place]) 03/21/2014   Uterine cancer ([place]) 03/21/2014   S/P mastectomy, bilateral 05/02/2013   Situational stress 05/02/2013   Osteoporosis, unspecified 05/02/2013   Breast cancer, left breast ([place]) 05/02/2013    Keats Kingry, PTA 05/13/2021, 9:29 AM  [facility] @ [place], [place], [address] Phone: [phone]   Fax:  [phone]  Name: YEILIN ZWEBER MRN: [id number] Date of Birth: July 23, 1940

## 2021-05-16 ENCOUNTER — Ambulatory Visit: Payer: Medicare HMO

## 2021-05-16 ENCOUNTER — Other Ambulatory Visit: Payer: Self-pay

## 2021-05-16 DIAGNOSIS — R262 Difficulty in walking, not elsewhere classified: Secondary | ICD-10-CM | POA: Diagnosis not present

## 2021-05-16 DIAGNOSIS — M5136 Other intervertebral disc degeneration, lumbar region: Secondary | ICD-10-CM

## 2021-05-16 DIAGNOSIS — M6281 Muscle weakness (generalized): Secondary | ICD-10-CM

## 2021-05-16 DIAGNOSIS — R293 Abnormal posture: Secondary | ICD-10-CM | POA: Diagnosis not present

## 2021-05-16 NOTE — Therapy (Signed)
[facility] Outpatient & Specialty Re[facility] @ [facility] [address] [place], [place], [address] Phone: [phone]   Fax:  [phone]  Physical Therapy Treatment  Patient Details  Name: Beth Simon MRN: [id number] Date of Birth: 30-Apr-1940 Referring Provider (PT): Dr. Albertha Ghee   Encounter Date: 05/16/2021 Progress Note Reporting Period 04/08/21 to 05/16/21  See note below for Objective Data and Assessment of Progress/Goals.      PT End of Session - 05/16/21 0930     Visit Number 10    Date for PT Re-Evaluation 06/03/21    Authorization Type Aetna Medicare    Progress Note Due on Visit 20    PT Start Time 0850    PT Stop Time 0930    PT Time Calculation (min) 40 min    Activity Tolerance Patient tolerated treatment well    Behavior During Therapy [facility] for tasks assessed/performed             Past Medical History:  Diagnosis Date   Adenomatous polyps 01/2016   Anemia    Breast cancer (HCC) 1990   bilateral mastectomy, adenoca breast-left MRM, reconstruction, chemo   Bronchitis last 2 weeks   saw dr Pete Glatter 02-28-2014, he said no antibiotics needed, nonproductive cough   Complication of anesthesia    Cystitis    cytoxen, had once or twice   Family history of breast cancer    History of breast cancer    History of radiation therapy 2/10, 2/12, 2/18, 2/24, 05/29/14   vaginal cuff/ 30 Gy/ 5 fx   Hypertension    Macular degeneration    Neck pain    taking physictal therapy last 2 weeks   Osteoporosis    On Prolia.   PONV (postoperative nausea and vomiting)    Shoulder pain    taking physical therapy for last few weeks   Uterine cancer (HCC) 03/21/2014   MLH1/PMS2 LOH   Uterine fibroid    VAIN I (vaginal intraepithelial neoplasia grade I) 2018   positive HR HPV.   Vasovagal syncopes     Past Surgical History:  Procedure Laterality Date   BREAST IMPLANT REMOVAL  6/94   left breast   BREAST RECONSTRUCTION  1990   left   CATARACT  EXTRACTION Bilateral    EXCISION VAGINAL CYST     MASTECTOMY Right 10/08/06   prophylactic   RADICAL MASTECTOMY LND  1990   left, chemo done   ROBOTIC ASSISTED TOTAL HYSTERECTOMY WITH BILATERAL SALPINGO OOPHERECTOMY Bilateral 03/21/2014   Procedure: ROBOTIC ASSISTED TOTAL HYSTERECTOMY WITH BILATERAL SALPINGO OOPHORECTOMY ;  Surgeon: Laurette Schimke, MD;  Location: [facility];  Service: Gynecology;  Laterality: Bilateral;    There were no vitals filed for this visit.   Subjective Assessment - 05/16/21 0852     Subjective I got in my 2 mile walk on Tuesday. I had some tendereness then stiffness and it resolved quickly.  I haven't had the sciatic pain since monday.  50% improvement.    Pertinent History Htn, osteoporosis    Patient Stated Goals To gain control of her back and hip pain and be able to resume her prior level of function    Currently in Pain? Yes    Pain Location Buttocks    Pain Orientation Left    Pain Descriptors / Indicators Aching    Pain Type Chronic pain    Pain Onset More than a month ago    Pain Frequency Intermittent    Aggravating Factors  standing and walking    Pain Relieving Factors ice, rest                [facility] PT Assessment - 05/16/21 0001       Assessment   Medical Diagnosis Lumbar DDD    Referring Provider (PT) Dr. Wandra Feinstein    Onset Date/Surgical Date 08/27/20      Prior Function   Level of Independence Independent    Vocation Retired    Secondary school teacher, church activities, plays piano      Observation/Other Assessments   Focus on Therapeutic Outcomes (FOTO)  5                           [facility] Adult PT Treatment/Exercise - 05/16/21 0001       Lumbar Exercises: Standing   Other Standing Lumbar Exercises Reciprocal stairs 4 setps 3x      Lumbar Exercises: Seated   Long Arc Quad on Chair Strengthening;Both;2 sets;10 reps;Weights    LAQ on Chair Weights (lbs) 2.5    LAQ on Chair Limitations lumbar support given     Other Seated Lumbar Exercises Seated cat/cow 10x      Manual Therapy   Manual Therapy Soft tissue mobilization;Myofascial release    Manual therapy comments skilled palpation and monitoring with DN today.    Soft tissue mobilization elongation and trigger point release to bil lumbar spine and gluteals              Trigger Point Dry Needling - 05/16/21 0001     Consent Given? Yes    Education Handout Provided Previously provided    Muscles Treated Back/Hip Gluteus minimus;Gluteus medius;Lumbar multifidi    Gluteus Minimus Response Twitch response elicited;Palpable increased muscle length    Gluteus Medius Response Twitch response elicited;Palpable increased muscle length    Lumbar multifidi Response Twitch response elicited;Palpable increased muscle length                     PT Short Term Goals - 04/08/21 2054       PT SHORT TERM GOAL #1   Title be independent in initial HEP    Time 4    Period Weeks    Status New    Target Date 05/06/21      PT SHORT TERM GOAL #2   Title Patient to be able to walk 15 min without pain or need to stop and rest.    Time 4    Period Weeks    Status New    Target Date 05/06/21               PT Long Term Goals - 05/16/21 0854       PT LONG TERM GOAL #1   Title be independent in advanced HEP    Status On-going      PT LONG TERM GOAL #2   Title Patient will report pain at no greater than 2/10    Baseline 3/10    Status On-going      PT LONG TERM GOAL #3   Title Patient will be able to resume walking program with her friends    Baseline walked 2 miles with a friend- no limitation    Status On-going      PT LONG TERM GOAL #4   Title FOTO to improve to 69    Baseline 59    Status On-going  PT LONG TERM GOAL #5   Title --                   Plan - 05/16/21 0907     Clinical Impression Statement Pt reports 50% overall improvement in symptoms since the start of care.  Pt was able to walk 2 miles  with her friend and experienced aching and stiffness but was not limited in doing this.  Pt was able to drive 1.5 hrs with no Rt ankle cramps but an ache was definitely present. Lt hip has been doing better overall but pt does report intermittent sciatic pains in both LE. Pt with tension and trigger points in gluteals and lumbar spine and had good response to DN with twitch response and improved tissue mobility after session.  Pt will continue to benefit from skilled PT to address strength, tissue mobility and flexibility to improve functional mobility.    Stability/Clinical Decision Making Evolving/Moderate complexity    PT Frequency 2x / week    PT Duration 8 weeks    PT Treatment/Interventions ADLs/Self Care Home Management;Aquatic Therapy;Moist Heat;Iontophoresis 4mg /ml Dexamethasone;Electrical Stimulation;Cryotherapy;Ultrasound;Gait training;Therapeutic exercise;Therapeutic activities;Functional mobility training;Stair training;Balance training;Neuromuscular re-education;Patient/family education;Manual techniques;Passive range of motion;Taping;Dry needling    PT Next Visit Plan Continue to advance hip and core strength, DN as needed.  ERO on or before 06/03/21    PT Home Exercise Plan Access Code: [email or web address]    Recommended Other Services initial cert is signed    Consulted and Agree with Plan of Care Patient             Patient will benefit from skilled therapeutic intervention in order to improve the following deficits and impairments:  Abnormal gait, Decreased balance, Decreased mobility, Difficulty walking, Decreased strength, Increased fascial restricitons, Impaired flexibility, Postural dysfunction, Pain  Visit Diagnosis: Abnormal posture  Muscle weakness (generalized)  Lumbar degenerative disc disease     Problem List Patient Active Problem List   Diagnosis Date Noted   Age-related osteoporosis without current pathological fracture 03/19/2020   Personal history of colonic  polyps 03/19/2020   Transient cerebral ischemia 09/15/2017   Pure hypercholesterolemia 09/15/2017   Pulmonary nodule 09/15/2017   Hypertension 09/15/2017   Gastro-esophageal reflux disease without esophagitis 09/15/2017   VAIN I (vaginal intraepithelial neoplasia grade I) 06/23/2017   Genetic testing 05/23/2014   History of breast cancer    Family history of breast cancer    Endometrial cancer ([place]) 03/21/2014   Uterine cancer ([place]) 03/21/2014   S/P mastectomy, bilateral 05/02/2013   Situational stress 05/02/2013   Osteoporosis, unspecified 05/02/2013   Breast cancer, left breast ([place]) 05/02/2013    Sigurd Sos, PT 05/16/21 9:32 AM   [facility] @ [facility] [place], [place], [address] Phone: [phone]   Fax:  [phone]  Name: Beth Simon MRN: [id number] Date of Birth: Dec 23, 1940

## 2021-05-20 ENCOUNTER — Ambulatory Visit: Payer: Medicare HMO

## 2021-05-20 ENCOUNTER — Other Ambulatory Visit: Payer: Self-pay

## 2021-05-20 DIAGNOSIS — R262 Difficulty in walking, not elsewhere classified: Secondary | ICD-10-CM

## 2021-05-20 DIAGNOSIS — M6281 Muscle weakness (generalized): Secondary | ICD-10-CM | POA: Diagnosis not present

## 2021-05-20 DIAGNOSIS — M5136 Other intervertebral disc degeneration, lumbar region: Secondary | ICD-10-CM

## 2021-05-20 DIAGNOSIS — R293 Abnormal posture: Secondary | ICD-10-CM | POA: Diagnosis not present

## 2021-05-20 DIAGNOSIS — M51369 Other intervertebral disc degeneration, lumbar region without mention of lumbar back pain or lower extremity pain: Secondary | ICD-10-CM

## 2021-05-20 NOTE — Therapy (Signed)
[facility] Outpatient [facility]ty [facility] @ [facility] [address] [place], [place], [address] Phone: [phone]   Fax:  [phone]  Physical Therapy Treatment  Patient Details  Name: Beth Simon MRN: [id number] Date of Birth: 11-07-1940 Referring Provider (PT): Dr. Albertha Ghee   Encounter Date: 05/20/2021   PT End of Session - 05/20/21 0932     Visit Number 11    Date for PT Re-Evaluation 06/03/21    Authorization Type Aetna Medicare    Progress Note Due on Visit 20    PT Start Time 0856   late   PT Stop Time 0931    PT Time Calculation (min) 35 min    Activity Tolerance Patient tolerated treatment well    Behavior During Therapy [facility] for tasks assessed/performed             Past Medical History:  Diagnosis Date   Adenomatous polyps 01/2016   Anemia    Breast cancer (HCC) 1990   bilateral mastectomy, adenoca breast-left MRM, reconstruction, chemo   Bronchitis last 2 weeks   saw dr Pete Glatter 02-28-2014, he said no antibiotics needed, nonproductive cough   Complication of anesthesia    Cystitis    cytoxen, had once or twice   Family history of breast cancer    History of breast cancer    History of radiation therapy 2/10, 2/12, 2/18, 2/24, 05/29/14   vaginal cuff/ 30 Gy/ 5 fx   Hypertension    Macular degeneration    Neck pain    taking physictal therapy last 2 weeks   Osteoporosis    On Prolia.   PONV (postoperative nausea and vomiting)    Shoulder pain    taking physical therapy for last few weeks   Uterine cancer (HCC) 03/21/2014   MLH1/PMS2 LOH   Uterine fibroid    VAIN I (vaginal intraepithelial neoplasia grade I) 2018   positive HR HPV.   Vasovagal syncopes     Past Surgical History:  Procedure Laterality Date   BREAST IMPLANT REMOVAL  6/94   left breast   BREAST RECONSTRUCTION  1990   left   CATARACT EXTRACTION Bilateral    EXCISION VAGINAL CYST     MASTECTOMY Right 10/08/06   prophylactic   RADICAL MASTECTOMY LND   1990   left, chemo done   ROBOTIC ASSISTED TOTAL HYSTERECTOMY WITH BILATERAL SALPINGO OOPHERECTOMY Bilateral 03/21/2014   Procedure: ROBOTIC ASSISTED TOTAL HYSTERECTOMY WITH BILATERAL SALPINGO OOPHORECTOMY ;  Surgeon: Laurette Schimke, MD;  Location: [facility];  Service: Gynecology;  Laterality: Bilateral;    There were no vitals filed for this visit.   Subjective Assessment - 05/20/21 0857     Subjective I'm doing fair.  I still have a soreness in the Lt side of my back.    Currently in Pain? Yes    Pain Score 2     Pain Location Back    Pain Orientation Left    Pain Descriptors / Indicators Aching    Pain Type Chronic pain    Pain Onset More than a month ago    Pain Frequency Intermittent    Aggravating Factors  standing and walking    Pain Relieving Factors ice, rest                               [facility] Adult PT Treatment/Exercise - 05/20/21 0001       Lumbar Exercises: Stretches  Active Hamstring Stretch Left;Right;3 reps;20 seconds    Active Hamstring Stretch Limitations Seated      Lumbar Exercises: Standing   Other Standing Lumbar Exercises Reciprocal stairs 4 setps 3x      Lumbar Exercises: Seated   Long Arc Quad on Chair Strengthening;Both;2 sets;10 reps;Weights    LAQ on Chair Weights (lbs) 2.5    LAQ on Chair Limitations lumbar support given    Other Seated Lumbar Exercises yellow loop clamshells 2x10    Other Seated Lumbar Exercises sidebending stretch over peanut ball in sitting: 3x20 seconds      Lumbar Exercises: Sidelying   Clam Left;20 reps      Manual Therapy   Manual Therapy Soft tissue mobilization;Myofascial release    Soft tissue mobilization Addaday to bil gluteals and lumbar spine                       PT Short Term Goals - 04/08/21 2054       PT SHORT TERM GOAL #1   Title be independent in initial HEP    Time 4    Period Weeks    Status New    Target Date 05/06/21      PT SHORT TERM GOAL #2   Title  Patient to be able to walk 15 min without pain or need to stop and rest.    Time 4    Period Weeks    Status New    Target Date 05/06/21               PT Long Term Goals - 05/16/21 0854       PT LONG TERM GOAL #1   Title be independent in advanced HEP    Status On-going      PT LONG TERM GOAL #2   Title Patient will report pain at no greater than 2/10    Baseline 3/10    Status On-going      PT LONG TERM GOAL #3   Title Patient will be able to resume walking program with her friends    Baseline walked 2 miles with a friend- no limitation    Status On-going      PT LONG TERM GOAL #4   Title FOTO to improve to 69    Baseline 59    Status On-going      PT LONG TERM GOAL #5   Title --                   Plan - 05/20/21 0903     Clinical Impression Statement Pt continues to report 50% overall improvement in symptoms since the start of care. Pt reports a fairly constant soreness in the Lt lumbar spine.  Pt denies that this pain limits her significantly but feels like she needs to sit after standing to perform cooking tasks.  Pt was able to walk 2 miles with her friend last week and plans to do this again this week.  Pt did well with all exercises in the clinic today and required intermittent tactile cues for alignment. Pt with tension and trigger points in gluteals and lumbar spine and had good response to addaday.  Pt will continue to benefit from skilled PT to address strength, tissue mobility and flexibility to improve functional mobility.    PT Frequency 2x / week    PT Duration 8 weeks    PT Treatment/Interventions ADLs/Self Care Home Management;Aquatic Therapy;Moist Heat;Iontophoresis 4mg /ml Dexamethasone;Electrical Stimulation;Cryotherapy;Ultrasound;Gait training;Therapeutic  exercise;Therapeutic activities;Functional mobility training;Stair training;Balance training;Neuromuscular re-education;Patient/family education;Manual techniques;Passive range of  motion;Taping;Dry needling    PT Next Visit Plan Continue to advance hip and core strength, DN as needed.  ERO on or before 06/03/21    PT Home Exercise Plan Access Code: [email or web address]    Consulted and Agree with Plan of Care Patient             Patient will benefit from skilled therapeutic intervention in order to improve the following deficits and impairments:  Abnormal gait, Decreased balance, Decreased mobility, Difficulty walking, Decreased strength, Increased fascial restricitons, Impaired flexibility, Postural dysfunction, Pain  Visit Diagnosis: Abnormal posture  Muscle weakness (generalized)  Lumbar degenerative disc disease  Difficulty in walking, not elsewhere classified     Problem List Patient Active Problem List   Diagnosis Date Noted   Age-related osteoporosis without current pathological fracture 03/19/2020   Personal history of colonic polyps 03/19/2020   Transient cerebral ischemia 09/15/2017   Pure hypercholesterolemia 09/15/2017   Pulmonary nodule 09/15/2017   Hypertension 09/15/2017   Gastro-esophageal reflux disease without esophagitis 09/15/2017   VAIN I (vaginal intraepithelial neoplasia grade I) 06/23/2017   Genetic testing 05/23/2014   History of breast cancer    Family history of breast cancer    Endometrial cancer ([place]) 03/21/2014   Uterine cancer ([place]) 03/21/2014   S/P mastectomy, bilateral 05/02/2013   Situational stress 05/02/2013   Osteoporosis, unspecified 05/02/2013   Breast cancer, left breast ([place]) 05/02/2013   Sigurd Sos, PT 05/20/21 9:34 AM   [facility] @ [place], [place], [address] Phone: [phone]   Fax:  [phone]  Name: Beth Simon MRN: [id number] Date of Birth: [birth year]

## 2021-05-23 ENCOUNTER — Ambulatory Visit: Payer: Medicare HMO

## 2021-05-23 ENCOUNTER — Other Ambulatory Visit: Payer: Self-pay

## 2021-05-23 DIAGNOSIS — R293 Abnormal posture: Secondary | ICD-10-CM

## 2021-05-23 DIAGNOSIS — R262 Difficulty in walking, not elsewhere classified: Secondary | ICD-10-CM | POA: Diagnosis not present

## 2021-05-23 DIAGNOSIS — M6281 Muscle weakness (generalized): Secondary | ICD-10-CM | POA: Diagnosis not present

## 2021-05-23 DIAGNOSIS — M5136 Other intervertebral disc degeneration, lumbar region: Secondary | ICD-10-CM

## 2021-05-23 NOTE — Therapy (Signed)
[facility] @ [place], [place], [address] Phone: [phone]   Fax:  [phone]  Physical Therapy Treatment  Patient Details  Name: Beth Simon MRN: [id number] Date of Birth: [birth year] Referring Provider (PT): Dr. Wandra Feinstein   Encounter Date: 05/23/2021   PT End of Session - 05/23/21 0929     Visit Number 12    Date for PT Re-Evaluation 06/03/21    Authorization Type [employer] Medicare    Progress Note Due on Visit 20    PT Start Time 0847    PT Stop Time 0928    PT Time Calculation (min) 41 min    Activity Tolerance Patient tolerated treatment well    Behavior During Therapy [facility] for tasks assessed/performed             Past Medical History:  Diagnosis Date   Adenomatous polyps 01/2016   Anemia    Breast cancer ([place]) 1990   bilateral mastectomy, adenoca breast-left MRM, reconstruction, chemo   Bronchitis last 2 weeks   saw dr Felipa Eth 02-28-2014, he said no antibiotics needed, nonproductive cough   Complication of anesthesia    Cystitis    cytoxen, had once or twice   Family history of breast cancer    History of breast cancer    History of radiation therapy 2/10, 2/12, 2/18, 2/24, 05/29/14   vaginal cuff/ 30 Gy/ 5 fx   Hypertension    Macular degeneration    Neck pain    taking physictal therapy last 2 weeks   Osteoporosis    On Prolia.   PONV (postoperative nausea and vomiting)    Shoulder pain    taking physical therapy for last few weeks   Uterine cancer ([place]) 03/21/2014   MLH1/PMS2 LOH   Uterine fibroid    VAIN I (vaginal intraepithelial neoplasia grade I) 2018   positive HR HPV.   Vasovagal syncopes     Past Surgical History:  Procedure Laterality Date   BREAST IMPLANT REMOVAL  6/94   left breast   BREAST RECONSTRUCTION  1990   left   CATARACT EXTRACTION Bilateral    EXCISION VAGINAL CYST     MASTECTOMY Right 10/08/06   prophylactic   RADICAL MASTECTOMY LND  1990    left, chemo done   ROBOTIC ASSISTED TOTAL HYSTERECTOMY WITH BILATERAL SALPINGO OOPHERECTOMY Bilateral 03/21/2014   Procedure: ROBOTIC ASSISTED TOTAL HYSTERECTOMY WITH BILATERAL SALPINGO OOPHORECTOMY ;  Surgeon: Janie Morning, MD;  Location: [facility];  Service: Gynecology;  Laterality: Bilateral;    There were no vitals filed for this visit.   Subjective Assessment - 05/23/21 0853     Subjective I was feeling good until I went to get out of the car this morning.  Things are about the same.    Currently in Pain? Yes    Pain Score 2     Pain Location Back    Pain Orientation Left    Pain Descriptors / Indicators Aching    Pain Type Chronic pain                               [facility] Adult PT Treatment/Exercise - 05/23/21 0001       Lumbar Exercises: Stretches   Active Hamstring Stretch Left;Right;3 reps;20 seconds    Active Hamstring Stretch Limitations Seated    Figure 4 Stretch 3 reps;20 seconds    Figure 4 Stretch Limitations  seated and supine      Lumbar Exercises: Standing   Other Standing Lumbar Exercises Reciprocal stairs 4 setps 3x      Lumbar Exercises: Seated   Long Arc Quad on Chair Strengthening;Both;2 sets;10 reps;Weights    LAQ on Chair Weights (lbs) 2.5    LAQ on Chair Limitations lumbar support given    Other Seated Lumbar Exercises yellow loop clamshells 2x10      Lumbar Exercises: Sidelying   Clam Left;20 reps      Manual Therapy   Manual Therapy Soft tissue mobilization;Myofascial release    Soft tissue mobilization Addaday to bil gluteals and lumbar spine                       PT Short Term Goals - 05/23/21 0853       PT SHORT TERM GOAL #1   Title be independent in initial HEP    Status Achieved      PT SHORT TERM GOAL #2   Title Patient to be able to walk 15 min without pain or need to stop and rest.    Status Achieved      PT SHORT TERM GOAL #3   Title Pt will be able to demo improved posture in sitting and  standing without discomfort with neck and scapular retraction    Status Achieved               PT Long Term Goals - 05/23/21 0854       PT LONG TERM GOAL #2   Title Patient will report pain at no greater than 2/10    Status On-going                   Plan - 05/23/21 0858     Clinical Impression Statement Pt continues to report 50% overall improvement in symptoms since the start of care. Pt reports that pain is up and down and she continues to experience tightness and soreness of the Lt gluteals.  Pt has not been doing consistent stretches for the gluteal region and PT encouraged pt to do this more consistently.    Pt was able to walk 2 miles with her friend this week and reports tightness and some pain while doing this.  Pt did well with all exercises in the clinic today and required intermittent tactile cues for alignment. Pt with tension and trigger points in gluteals and lumbar spine and had good response to addaday.  Pt will continue to benefit from skilled PT to address strength, tissue mobility and flexibility to improve functional mobility    PT Frequency 2x / week    PT Duration 8 weeks    PT Treatment/Interventions ADLs/Self Care Home Management;Aquatic Therapy;Moist Heat;Iontophoresis 4mg /ml Dexamethasone;Electrical Stimulation;Cryotherapy;Ultrasound;Gait training;Therapeutic exercise;Therapeutic activities;Functional mobility training;Stair training;Balance training;Neuromuscular re-education;Patient/family education;Manual techniques;Passive range of motion;Taping;Dry needling    PT Next Visit Plan Continue to advance hip and core strength, DN as needed.  ERO on or before 06/03/21    PT Home Exercise Plan Access Code: [email or web address]    Consulted and Agree with Plan of Care Patient             Patient will benefit from skilled therapeutic intervention in order to improve the following deficits and impairments:  Abnormal gait, Decreased balance, Decreased mobility,  Difficulty walking, Decreased strength, Increased fascial restricitons, Impaired flexibility, Postural dysfunction, Pain  Visit Diagnosis: Abnormal posture  Muscle weakness (generalized)  Lumbar degenerative disc disease  Problem List Patient Active Problem List   Diagnosis Date Noted   Age-related osteoporosis without current pathological fracture 03/19/2020   Personal history of colonic polyps 03/19/2020   Transient cerebral ischemia 09/15/2017   Pure hypercholesterolemia 09/15/2017   Pulmonary nodule 09/15/2017   Hypertension 09/15/2017   Gastro-esophageal reflux disease without esophagitis 09/15/2017   VAIN I (vaginal intraepithelial neoplasia grade I) 06/23/2017   Genetic testing 05/23/2014   History of breast cancer    Family history of breast cancer    Endometrial cancer ([place]) 03/21/2014   Uterine cancer ([place]) 03/21/2014   S/P mastectomy, bilateral 05/02/2013   Situational stress 05/02/2013   Osteoporosis, unspecified 05/02/2013   Breast cancer, left breast ([place]) 05/02/2013    Sigurd Sos, PT 05/23/21 9:30 AM  [place] [facility] @ [place], [place], [address] Phone: [phone]   Fax:  [phone]  Name: Beth Simon MRN: [id number] Date of Birth: Aug 16, 1940

## 2021-05-27 ENCOUNTER — Ambulatory Visit: Payer: Medicare HMO | Admitting: Physical Therapy

## 2021-05-27 ENCOUNTER — Other Ambulatory Visit: Payer: Self-pay

## 2021-05-27 ENCOUNTER — Encounter: Payer: Self-pay | Admitting: Physical Therapy

## 2021-05-27 DIAGNOSIS — M6281 Muscle weakness (generalized): Secondary | ICD-10-CM

## 2021-05-27 DIAGNOSIS — M5136 Other intervertebral disc degeneration, lumbar region: Secondary | ICD-10-CM

## 2021-05-27 DIAGNOSIS — R262 Difficulty in walking, not elsewhere classified: Secondary | ICD-10-CM

## 2021-05-27 DIAGNOSIS — R293 Abnormal posture: Secondary | ICD-10-CM | POA: Diagnosis not present

## 2021-05-27 NOTE — Therapy (Signed)
[facility] @ [place] [place], [place], [address] Phone: [phone]   Fax:  [phone]  Physical Therapy Treatment  Patient Details  Name: Beth Simon MRN: [id number] Date of Birth: [birth year] Referring Provider (PT): Dr. Wandra Feinstein   Encounter Date: 05/27/2021   PT End of Session - 05/27/21 0857     Visit Number 13    Date for PT Re-Evaluation 06/03/21    Authorization Type [employer] Medicare    Progress Note Due on Visit 20    PT Start Time 0900   Pt late and then at front desk for > 5-8 min   PT Stop Time 0930    PT Time Calculation (min) 30 min    Activity Tolerance Patient tolerated treatment well    Behavior During Therapy [facility] for tasks assessed/performed             Past Medical History:  Diagnosis Date   Adenomatous polyps 01/2016   Anemia    Breast cancer ([place]) 1990   bilateral mastectomy, adenoca breast-left MRM, reconstruction, chemo   Bronchitis last 2 weeks   saw dr Felipa Eth 02-28-2014, he said no antibiotics needed, nonproductive cough   Complication of anesthesia    Cystitis    cytoxen, had once or twice   Family history of breast cancer    History of breast cancer    History of radiation therapy 2/10, 2/12, 2/18, 2/24, 05/29/14   vaginal cuff/ 30 Gy/ 5 fx   Hypertension    Macular degeneration    Neck pain    taking physictal therapy last 2 weeks   Osteoporosis    On Prolia.   PONV (postoperative nausea and vomiting)    Shoulder pain    taking physical therapy for last few weeks   Uterine cancer ([place]) 03/21/2014   MLH1/PMS2 LOH   Uterine fibroid    VAIN I (vaginal intraepithelial neoplasia grade I) 2018   positive HR HPV.   Vasovagal syncopes     Past Surgical History:  Procedure Laterality Date   BREAST IMPLANT REMOVAL  6/94   left breast   BREAST RECONSTRUCTION  1990   left   CATARACT EXTRACTION Bilateral    EXCISION VAGINAL CYST     MASTECTOMY Right 10/08/06    prophylactic   RADICAL MASTECTOMY LND  1990   left, chemo done   ROBOTIC ASSISTED TOTAL HYSTERECTOMY WITH BILATERAL SALPINGO OOPHERECTOMY Bilateral 03/21/2014   Procedure: ROBOTIC ASSISTED TOTAL HYSTERECTOMY WITH BILATERAL SALPINGO OOPHORECTOMY ;  Surgeon: Janie Morning, MD;  [facility];  Service: Gynecology;  Laterality: Bilateral;    There were no vitals filed for this visit.   Subjective Assessment - 05/27/21 0857     Subjective I feel like I have a constant "knot" in my LT sacral area.    Currently in Pain? No/denies    Multiple Pain Sites No                               [facility] Adult PT Treatment/Exercise - 05/27/21 0001       Lumbar Exercises: Stretches   Active Hamstring Stretch Left;Right;3 reps;20 seconds    Active Hamstring Stretch Limitations Seated    Other Lumbar Stretch Exercise Seated cat cow 10x, VC for more lumbar extension      Lumbar Exercises: Standing   Functional Squats Limitations Lateral steps LT with teal pod on first step 2x10  Shoulder Adduction Limitations Reciprocal stairs 10x    Other Standing Lumbar Exercises Standing side steps with green loop 10x    Other Standing Lumbar Exercises Hip abd 10x 2.5# VC for very slow speed to concentrate on activating glute medius AND  stay lifted in her stance leg      Lumbar Exercises: Seated   Long Arc Quad on Chair Strengthening;Both;2 sets;10 reps;Weights    LAQ on Chair Weights (lbs) 2.5    LAQ on Chair Limitations lumbar support given      Manual Therapy   Manual Therapy Soft tissue mobilization;Myofascial release    Soft tissue mobilization Addaday to bil gluteals and lumbar spine                       PT Short Term Goals - 05/23/21 0853       PT SHORT TERM GOAL #1   Title be independent in initial HEP    Status Achieved      PT SHORT TERM GOAL #2   Title Patient to be able to walk 15 min without pain or need to stop and rest.    Status Achieved      PT  SHORT TERM GOAL #3   Title Pt will be able to demo improved posture in sitting and standing without discomfort with neck and scapular retraction    Status Achieved               PT Long Term Goals - 05/23/21 0854       PT LONG TERM GOAL #2   Title Patient will report pain at no greater than 2/10    Status On-going                   Plan - 05/27/21 0858     Clinical Impression Statement Pt arrives pain free today. Reports this constant feeling of a "knot" in and around her LT sacral area. Pt had no pain with todays exercises during or post. Pt did demonstrate decreased lumbar extension during seated cat/cow exercise. PTA suggested she work on that at home more. Pt demonstrated improved Lt hip stability when in LT single leg stance.    Personal Factors and Comorbidities Age;Comorbidity 1;Comorbidity 2    Comorbidities Htn, osteoporosis    Examination-Activity Limitations Stand;Squat    Examination-Participation Restrictions Community Activity;Cleaning;Laundry    Stability/Clinical Decision Making Evolving/Moderate complexity    Rehab Potential Fair    PT Frequency 2x / week    PT Duration 8 weeks    PT Treatment/Interventions ADLs/Self Care Home Management;Aquatic Therapy;Moist Heat;Iontophoresis 4mg /ml Dexamethasone;Electrical Stimulation;Cryotherapy;Ultrasound;Gait training;Therapeutic exercise;Therapeutic activities;Functional mobility training;Stair training;Balance training;Neuromuscular re-education;Patient/family education;Manual techniques;Passive range of motion;Taping;Dry needling    PT Next Visit Plan Continue to advance hip and core strength, DN as needed.  ERO next session.    PT Home Exercise Plan Access Code: [email or web address]    Consulted and Agree with Plan of Care Patient             Patient will benefit from skilled therapeutic intervention in order to improve the following deficits and impairments:  Abnormal gait, Decreased balance, Decreased mobility,  Difficulty walking, Decreased strength, Increased fascial restricitons, Impaired flexibility, Postural dysfunction, Pain  Visit Diagnosis: Abnormal posture  Muscle weakness (generalized)  Lumbar degenerative disc disease  Difficulty in walking, not elsewhere classified     Problem List Patient Active Problem List   Diagnosis Date Noted   Age-related osteoporosis without current pathological fracture 03/19/2020  Personal history of colonic polyps 03/19/2020   Transient cerebral ischemia 09/15/2017   Pure hypercholesterolemia 09/15/2017   Pulmonary nodule 09/15/2017   Hypertension 09/15/2017   Gastro-esophageal reflux disease without esophagitis 09/15/2017   VAIN I (vaginal intraepithelial neoplasia grade I) 06/23/2017   Genetic testing 05/23/2014   History of breast cancer    Family history of breast cancer    Endometrial cancer ([place]) 03/21/2014   Uterine cancer ([place]) 03/21/2014   S/P mastectomy, bilateral 05/02/2013   Situational stress 05/02/2013   Osteoporosis, unspecified 05/02/2013   Breast cancer, left breast ([place]) 05/02/2013    Fuad Forget, PTA 05/27/2021, 10:37 AM  [facility] @ [place], [place], [address] Phone: [phone]   Fax:  [phone]  Name: MAKAELAH CRANFIELD MRN: [id number] Date of Birth: December 23, 1940

## 2021-05-29 ENCOUNTER — Ambulatory Visit: Payer: Medicare HMO | Attending: Sports Medicine

## 2021-05-29 ENCOUNTER — Other Ambulatory Visit: Payer: Self-pay

## 2021-05-29 DIAGNOSIS — M5136 Other intervertebral disc degeneration, lumbar region: Secondary | ICD-10-CM | POA: Insufficient documentation

## 2021-05-29 DIAGNOSIS — M6281 Muscle weakness (generalized): Secondary | ICD-10-CM | POA: Diagnosis not present

## 2021-05-29 DIAGNOSIS — R262 Difficulty in walking, not elsewhere classified: Secondary | ICD-10-CM | POA: Diagnosis not present

## 2021-05-29 DIAGNOSIS — R293 Abnormal posture: Secondary | ICD-10-CM | POA: Diagnosis not present

## 2021-05-29 NOTE — Therapy (Signed)
[facility] @ [place], [place], [address] Phone: [phone]   Fax:  [phone]  Physical Therapy Treatment  Patient Details  Name: Beth Simon MRN: [id number] Date of Birth: 07/12/1940 Referring Provider (PT): Dr. Wandra Feinstein   Encounter Date: 05/29/2021   PT End of Session - 05/29/21 0928     Visit Number 14    Date for PT Re-Evaluation 06/26/21    Authorization Type [employer]na Medicare    Progress Note Due on Visit 20    PT Start Time 0848    PT Stop Time 0930    PT Time Calculation (min) 42 min    Activity Tolerance Patient tolerated treatment well    Behavior During Therapy [facility] for tasks assessed/performed             Past Medical History:  Diagnosis Date   Adenomatous polyps 01/2016   Anemia    Breast cancer ([place]) 1990   bilateral mastectomy, adenoca breast-left MRM, reconstruction, chemo   Bronchitis last 2 weeks   saw dr Felipa Eth 02-28-2014, he said no antibiotics needed, nonproductive cough   Complication of anesthesia    Cystitis    cytoxen, had once or twice   Family history of breast cancer    History of breast cancer    History of radiation therapy 2/10, 2/12, 2/18, 2/24, 05/29/14   vaginal cuff/ 30 Gy/ 5 fx   Hypertension    Macular degeneration    Neck pain    taking physictal therapy last 2 weeks   Osteoporosis    On Prolia.   PONV (postoperative nausea and vomiting)    Shoulder pain    taking physical therapy for last few weeks   Uterine cancer ([place]) 03/21/2014   MLH1/PMS2 LOH   Uterine fibroid    VAIN I (vaginal intraepithelial neoplasia grade I) 2018   positive HR HPV.   Vasovagal syncopes     Past Surgical History:  Procedure Laterality Date   BREAST IMPLANT REMOVAL  6/94   left breast   BREAST RECONSTRUCTION  1990   left   CATARACT EXTRACTION Bilateral    EXCISION VAGINAL CYST     MASTECTOMY Right 10/08/06   prophylactic   RADICAL MASTECTOMY LND  1990    left, chemo done   ROBOTIC ASSISTED TOTAL HYSTERECTOMY WITH BILATERAL SALPINGO OOPHERECTOMY Bilateral 03/21/2014   Procedure: ROBOTIC ASSISTED TOTAL HYSTERECTOMY WITH BILATERAL SALPINGO OOPHORECTOMY ;  Surgeon: Janie Morning, MD;  [facility];  Service: Gynecology;  Laterality: Bilateral;    There were no vitals filed for this visit.   Subjective Assessment - 05/29/21 0850     Subjective I am feeling good today.  I was not able to walk with my friend yesterday.  I am 75% better overall.  Last session worked me but it was good.    Currently in Pain? Yes    Pain Score 1     Pain Location Buttocks    Pain Orientation Left    Pain Descriptors / Indicators Aching                [facility] PT Assessment - 05/29/21 0001       Assessment   Medical Diagnosis Lumbar DDD    Referring Provider (PT) Dr. Wandra Feinstein    Onset Date/Surgical Date 08/27/20      Prior Function   Level of Independence Independent    Vocation Retired    Secondary school teacher, church  activities, plays piano      Observation/Other Assessments   Focus on Therapeutic Outcomes (FOTO)  79                           [facility] Adult PT Treatment/Exercise - 05/29/21 0001       Lumbar Exercises: Stretches   Active Hamstring Stretch Left;Right;3 reps;20 seconds    Active Hamstring Stretch Limitations Seated    Other Lumbar Stretch Exercise Seated cat cow 10x, VC for more lumbar extension      Lumbar Exercises: Standing   Functional Squats Limitations Lateral steps LT with teal pod on first step 2x10    Shoulder Adduction Limitations reciprocal stairs x10    Other Standing Lumbar Exercises Standing side steps with green loop 10x    Other Standing Lumbar Exercises Hip abd 10x 2.5# VC for very slow speed to concentrate on activating glute medius AND  stay lifted in her stance leg      Lumbar Exercises: Seated   Simon Arc Quad on Chair Strengthening;Both;2 sets;10 reps;Weights    LAQ on Chair Weights  (lbs) 2.5    LAQ on Chair Limitations lumbar support given      Manual Therapy   Manual Therapy Soft tissue mobilization;Myofascial release    Soft tissue mobilization Addaday to bil gluteals and lumbar spine                       PT Short Term Goals - 05/23/21 0853       PT SHORT TERM GOAL #1   Title be independent in initial HEP    Status Achieved      PT SHORT TERM GOAL #2   Title Patient to be able to walk 15 min without pain or need to stop and rest.    Status Achieved      PT SHORT TERM GOAL #3   Title Pt will be able to demo improved posture in sitting and standing without discomfort with neck and scapular retraction    Status Achieved               PT Simon Term Goals - 05/29/21 0853       PT Simon TERM GOAL #1   Title be independent in advanced HEP    Time 4    Period Weeks    Status On-going    Target Date 06/26/21      PT Simon TERM GOAL #2   Title Patient will report pain < or = to  at no greater than 1/10 in the Lt gluteals with standing and walking    Baseline 3/10    Time 4    Period Weeks    Status On-going    Target Date 06/26/21      PT Simon TERM GOAL #3   Title Patient will be able to resume walking program with her friends    Baseline walked 2 miles with a friend- no limitation    Status Achieved      PT Simon TERM GOAL #4   Title FOTO to improve to 69    Baseline 59    Time 4    Period Weeks    Status On-going    Target Date 06/26/21                   Plan - 05/29/21 0910     Clinical Impression Statement Pt reports 75% overall improvement  since the start of care.  Pt reports 3/10 max Lt gluteal pain that is worse with initiating movement after periods of being still or with walking Simon distances.  Pt has HEP in place for LE strength and gluteal stability.  FOTO is improved overall to 59 indicating improved function.  Pt with tension in gluteals and responds well to manual therapy.  PT provided verbal cueing for  glute medius activation with standing hip exercises.  Pt will continue to benefit from skilled PT to address Lt gluteal pain, functional weakness, flexibility and tissue mobility.    PT Frequency 2x / week    PT Duration 4 weeks    PT Treatment/Interventions ADLs/Self Care Home Management;Aquatic Therapy;Moist Heat;Iontophoresis 4m/ml Dexamethasone;Electrical Stimulation;Cryotherapy;Ultrasound;Gait training;Therapeutic exercise;Therapeutic activities;Functional mobility training;Stair training;Balance training;Neuromuscular re-education;Patient/family education;Manual techniques;Passive range of motion;Taping;Dry needling    PT Next Visit Plan Continue to advance hip and core strength, DN as needed.    PT Home Exercise Plan Access Code: [email or web address]    Recommended Other Services recert sent 35/3/29   Consulted and Agree with Plan of Care Patient             Patient will benefit from skilled therapeutic intervention in order to improve the following deficits and impairments:  Abnormal gait, Decreased balance, Decreased mobility, Difficulty walking, Decreased strength, Increased fascial restricitons, Impaired flexibility, Postural dysfunction, Pain  Visit Diagnosis: Abnormal posture - Plan: PT plan of care cert/re-cert  Lumbar degenerative disc disease - Plan: PT plan of care cert/re-cert  Muscle weakness (generalized) - Plan: PT plan of care cert/re-cert  Difficulty in walking, not elsewhere classified - Plan: PT plan of care cert/re-cert     Problem List Patient Active Problem List   Diagnosis Date Noted   Age-related osteoporosis without current pathological fracture 03/19/2020   Personal history of colonic polyps 03/19/2020   Transient cerebral ischemia 09/15/2017   Pure hypercholesterolemia 09/15/2017   Pulmonary nodule 09/15/2017   Hypertension 09/15/2017   Gastro-esophageal reflux disease without esophagitis 09/15/2017   VAIN I (vaginal intraepithelial neoplasia grade I)  06/23/2017   Genetic testing 05/23/2014   History of breast cancer    Family history of breast cancer    [place]   [place] 03/21[place]2014   S/P mastectomy, bilateral 05/02/2013   Situational stress 05/02/2013   Osteoporosis, unspecified 05/02/2013   Breast cancer, left breast ([place] 05/02/2013   KSigurd Sos PT 05/29/21 9:31 AM   [email or web address]: [phone]  Fax:  [phone] Name: Beth LONGMRN: [id number] of Birth: 407-28-1942

## 2021-06-03 ENCOUNTER — Encounter: Payer: Self-pay | Admitting: Physical Therapy

## 2021-06-03 ENCOUNTER — Other Ambulatory Visit: Payer: Self-pay

## 2021-06-03 ENCOUNTER — Ambulatory Visit: Payer: Medicare HMO | Admitting: Physical Therapy

## 2021-06-03 DIAGNOSIS — R262 Difficulty in walking, not elsewhere classified: Secondary | ICD-10-CM

## 2021-06-03 DIAGNOSIS — M5136 Other intervertebral disc degeneration, lumbar region: Secondary | ICD-10-CM

## 2021-06-03 DIAGNOSIS — M6281 Muscle weakness (generalized): Secondary | ICD-10-CM

## 2021-06-03 DIAGNOSIS — R293 Abnormal posture: Secondary | ICD-10-CM

## 2021-06-03 NOTE — Therapy (Signed)
[facility] @ [facility] [address] [place], [place], [address] Phone: [phone]   Fax:  [phone]  Physical Therapy Treatment  Patient Details  Name: Beth Simon MRN: [id number] Date of Birth: 01-31-1941 Referring Provider (PT): Dr. Albertha Ghee   Encounter Date: 06/03/2021   PT End of Session - 06/03/21 0934     Visit Number 15    Date for PT Re-Evaluation 06/26/21    Authorization Type Aetna Medicare    Progress Note Due on Visit 20    PT Start Time 0933    PT Stop Time 1015    PT Time Calculation (min) 42 min    Activity Tolerance Patient tolerated treatment well    Behavior During Therapy [facility] for tasks assessed/performed             Past Medical History:  Diagnosis Date   Adenomatous polyps 01/2016   Anemia    Breast cancer (HCC) 1990   bilateral mastectomy, adenoca breast-left MRM, reconstruction, chemo   Bronchitis last 2 weeks   saw dr Pete Glatter 02-28-2014, he said no antibiotics needed, nonproductive cough   Complication of anesthesia    Cystitis    cytoxen, had once or twice   Family history of breast cancer    History of breast cancer    History of radiation therapy 2/10, 2/12, 2/18, 2/24, 05/29/14   vaginal cuff/ 30 Gy/ 5 fx   Hypertension    Macular degeneration    Neck pain    taking physictal therapy last 2 weeks   Osteoporosis    On Prolia.   PONV (postoperative nausea and vomiting)    Shoulder pain    taking physical therapy for last few weeks   Uterine cancer (HCC) 03/21/2014   MLH1/PMS2 LOH   Uterine fibroid    VAIN I (vaginal intraepithelial neoplasia grade I) 2018   positive HR HPV.   Vasovagal syncopes     Past Surgical History:  Procedure Laterality Date   BREAST IMPLANT REMOVAL  6/94   left breast   BREAST RECONSTRUCTION  1990   left   CATARACT EXTRACTION Bilateral    EXCISION VAGINAL CYST     MASTECTOMY Right 10/08/06   prophylactic   RADICAL MASTECTOMY LND  1990    left, chemo done   ROBOTIC ASSISTED TOTAL HYSTERECTOMY WITH BILATERAL SALPINGO OOPHERECTOMY Bilateral 03/21/2014   Procedure: ROBOTIC ASSISTED TOTAL HYSTERECTOMY WITH BILATERAL SALPINGO OOPHORECTOMY ;  Surgeon: Laurette Schimke, MD;  Location: [facility];  Service: Gynecology;  Laterality: Bilateral;    There were no vitals filed for this visit.   Subjective Assessment - 06/03/21 0936     Subjective A little achey, tender at LT SI with the feeling of a knot there.    Currently in Pain? Yes    Pain Score 2     Pain Location Buttocks    Pain Orientation Left    Pain Descriptors / Indicators Dull                               [facility] Adult PT Treatment/Exercise - 06/03/21 0001       Lumbar Exercises: Stretches   Active Hamstring Stretch Left;Right;3 reps;20 seconds    Active Hamstring Stretch Limitations Seated    Other Lumbar Stretch Exercise Seated cat cow 10x, VC for more lumbar extension   Much improved lumbar extension     Lumbar Exercises: Standing  Functional Squats Limitations Lateral steps LT with teal pod on first step 2x10    Lifting Limitations LTLE single leg hinge: 3# to 2 blue pillows 10x holding 3# wt and RTUE holdingonto counter top    Shoulder Adduction Limitations MArching with yello wloop 10x   Reciprocal stairs 4x with hand rails   Other Standing Lumbar Exercises Side stepping yellow loop 4 lengths 2x    Other Standing Lumbar Exercises Hip abd 10x2  2.5# VC for very slow speed to concentrate on activating glute medius AND  stay lifted in her stance leg      Lumbar Exercises: Seated   Long Arc Quad on Chair Strengthening;Both;1 set;10 reps;Weights    LAQ on Chair Weights (lbs) 3    LAQ on Chair Limitations lumbar support given      Manual Therapy   Manual Therapy Soft tissue mobilization;Myofascial release    Soft tissue mobilization Addaday to bil gluteals and lumbar spine                       PT Short Term Goals - 05/23/21 0853        PT SHORT TERM GOAL #1   Title be independent in initial HEP    Status Achieved      PT SHORT TERM GOAL #2   Title Patient to be able to walk 15 min without pain or need to stop and rest.    Status Achieved      PT SHORT TERM GOAL #3   Title Pt will be able to demo improved posture in sitting and standing without discomfort with neck and scapular retraction    Status Achieved               PT Long Term Goals - 05/29/21 0853       PT LONG TERM GOAL #1   Title be independent in advanced HEP    Time 4    Period Weeks    Status On-going    Target Date 06/26/21      PT LONG TERM GOAL #2   Title Patient will report pain < or = to  at no greater than 1/10 in the Lt gluteals with standing and walking    Baseline 3/10    Time 4    Period Weeks    Status On-going    Target Date 06/26/21      PT LONG TERM GOAL #3   Title Patient will be able to resume walking program with her friends    Baseline walked 2 miles with a friend- no limitation    Status Achieved      PT LONG TERM GOAL #4   Title FOTO to improve to 69    Baseline 59    Time 4    Period Weeks    Status On-going    Target Date 06/26/21                   Plan - 06/03/21 1157     Clinical Impression Statement Pt arrives with mild Lt SI area pain ( kind of globally in that area as pt rubs it). By the end of todays session pt reported her hips 'felt worked" but did not use any words to indicate there was an aggrevation or increase in pain severity. Pt reports she is prepared for her discharge after her final 3 visits.    Personal Factors and Comorbidities Age;Comorbidity 1;Comorbidity 2    Comorbidities  Htn, osteoporosis    Examination-Activity Limitations Stand;Squat    Examination-Participation Restrictions Community Activity;Cleaning;Laundry    Stability/Clinical Decision Making Evolving/Moderate complexity    Rehab Potential Fair    PT Frequency 2x / week    PT Duration 4 weeks    PT  Treatment/Interventions ADLs/Self Care Home Management;Aquatic Therapy;Moist Heat;Iontophoresis 4mg /ml Dexamethasone;Electrical Stimulation;Cryotherapy;Ultrasound;Gait training;Therapeutic exercise;Therapeutic activities;Functional mobility training;Stair training;Balance training;Neuromuscular re-education;Patient/family education;Manual techniques;Passive range of motion;Taping;Dry needling    PT Next Visit Plan Continue to advance hip and core strength, DN as needed. Pt is prepared for discharge after her final 3 visits.    PT Home Exercise Plan Access Code: [email or web address]    Consulted and Agree with Plan of Care Patient             Patient will benefit from skilled therapeutic intervention in order to improve the following deficits and impairments:  Abnormal gait, Decreased balance, Decreased mobility, Difficulty walking, Decreased strength, Increased fascial restricitons, Impaired flexibility, Postural dysfunction, Pain  Visit Diagnosis: Abnormal posture  Lumbar degenerative disc disease  Muscle weakness (generalized)  Difficulty in walking, not elsewhere classified     Problem List Patient Active Problem List   Diagnosis Date Noted   Age-related osteoporosis without current pathological fracture 03/19/2020   Personal history of colonic polyps 03/19/2020   Transient cerebral ischemia 09/15/2017   Pure hypercholesterolemia 09/15/2017   Pulmonary nodule 09/15/2017   Hypertension 09/15/2017   Gastro-esophageal reflux disease without esophagitis 09/15/2017   VAIN I (vaginal intraepithelial neoplasia grade I) 06/23/2017   Genetic testing 05/23/2014   History of breast cancer    Family history of breast cancer    Endometrial cancer ([place]) 03/21/2014   Uterine cancer ([place]) 03/21/2014   S/P mastectomy, bilateral 05/02/2013   Situational stress 05/02/2013   Osteoporosis, unspecified 05/02/2013   Breast cancer, left breast ([place]) 05/02/2013    Onya Eutsler, PTA 06/03/2021, 12:00  PM  [facility] [place], [place], [address] Phone: [phone]   Fax:  [phone]  Name: Beth Simon MRN: [id number] Date of Birth: 1940-11-12

## 2021-06-04 ENCOUNTER — Ambulatory Visit: Payer: Medicare HMO | Admitting: Podiatry

## 2021-06-05 DIAGNOSIS — Z853 Personal history of malignant neoplasm of breast: Secondary | ICD-10-CM | POA: Diagnosis not present

## 2021-06-05 DIAGNOSIS — M810 Age-related osteoporosis without current pathological fracture: Secondary | ICD-10-CM | POA: Diagnosis not present

## 2021-06-05 DIAGNOSIS — I10 Essential (primary) hypertension: Secondary | ICD-10-CM | POA: Diagnosis not present

## 2021-06-05 DIAGNOSIS — Z008 Encounter for other general examination: Secondary | ICD-10-CM | POA: Diagnosis not present

## 2021-06-05 DIAGNOSIS — Z888 Allergy status to other drugs, medicaments and biological substances status: Secondary | ICD-10-CM | POA: Diagnosis not present

## 2021-06-05 DIAGNOSIS — Z6828 Body mass index (BMI) 28.0-28.9, adult: Secondary | ICD-10-CM | POA: Diagnosis not present

## 2021-06-05 DIAGNOSIS — Z79620 Long term (current) use of immunosuppressive biologic: Secondary | ICD-10-CM | POA: Diagnosis not present

## 2021-06-05 DIAGNOSIS — Z803 Family history of malignant neoplasm of breast: Secondary | ICD-10-CM | POA: Diagnosis not present

## 2021-06-05 DIAGNOSIS — E663 Overweight: Secondary | ICD-10-CM | POA: Diagnosis not present

## 2021-06-05 DIAGNOSIS — Z8249 Family history of ischemic heart disease and other diseases of the circulatory system: Secondary | ICD-10-CM | POA: Diagnosis not present

## 2021-06-05 DIAGNOSIS — E785 Hyperlipidemia, unspecified: Secondary | ICD-10-CM | POA: Diagnosis not present

## 2021-06-06 ENCOUNTER — Ambulatory Visit: Payer: Medicare HMO

## 2021-06-06 ENCOUNTER — Other Ambulatory Visit: Payer: Self-pay

## 2021-06-06 DIAGNOSIS — M5136 Other intervertebral disc degeneration, lumbar region: Secondary | ICD-10-CM | POA: Diagnosis not present

## 2021-06-06 DIAGNOSIS — M6281 Muscle weakness (generalized): Secondary | ICD-10-CM | POA: Diagnosis not present

## 2021-06-06 DIAGNOSIS — R293 Abnormal posture: Secondary | ICD-10-CM

## 2021-06-06 DIAGNOSIS — R262 Difficulty in walking, not elsewhere classified: Secondary | ICD-10-CM | POA: Diagnosis not present

## 2021-06-06 NOTE — Therapy (Signed)
[facility] ?[place], [place], [address] ?Phone: [phone]   Fax:  [phone] ? ?Physical Therapy Treatment ? ?Patient Details  ?Name: Beth Simon ?MRN: [id number] ?Date of Birth: 08-23-1940 ?Referring Provider (PT): Dr. Wandra Feinstein ? ? ?Encounter Date: 06/06/2021 ? ? PT End of Session - 06/06/21 0929   ? ? Visit Number 16   ? Date for PT Re-Evaluation 06/26/21   ? Authorization Type Aetna Medicare   ? Progress Note Due on Visit 20   ? PT Start Time [phone]   ? PT Stop Time 0929   ? PT Time Calculation (min) 40 min   ? Activity Tolerance Patient tolerated treatment well   ? Behavior During Therapy [facility] for tasks assessed/performed   ? ?  ?  ? ?  ? ? ?Past Medical History:  ?Diagnosis Date  ? Adenomatous polyps 01/2016  ? Anemia   ? Breast cancer ([place]) 1990  ? bilateral mastectomy, adenoca breast-left MRM, reconstruction, chemo  ? Bronchitis last 2 weeks  ? saw dr Felipa Eth 02-28-2014, he said no antibiotics needed, nonproductive cough  ? Complication of anesthesia   ? Cystitis   ? cytoxen, had once or twice  ? Family history of breast cancer   ? History of breast cancer   ? History of radiation therapy 2/10, 2/12, 2/18, 2/24, 05/29/14  ? vaginal cuff/ 30 Gy/ 5 fx  ? Hypertension   ? Macular degeneration   ? Neck pain   ? taking physictal therapy last 2 weeks  ? Osteoporosis   ? On Prolia.  ? PONV (postoperative nausea and vomiting)   ? Shoulder pain   ? taking physical therapy for last few weeks  ? Uterine cancer ([place]) 03/21/2014  ? MLH1/PMS2 LOH  ? Uterine fibroid   ? VAIN I (vaginal intraepithelial neoplasia grade I) 2018  ? positive HR HPV.  ? Vasovagal syncopes   ? ? ?Past Surgical History:  ?Procedure Laterality Date  ? BREAST IMPLANT REMOVAL  6/94  ? left breast  ? BREAST RECONSTRUCTION  1990  ? left  ? CATARACT EXTRACTION Bilateral   ? EXCISION VAGINAL CYST    ? MASTECTOMY Right 10/08/06  ? prophylactic  ? RADICAL MASTECTOMY LND  1990  ?  left, chemo done  ? ROBOTIC ASSISTED TOTAL HYSTERECTOMY WITH BILATERAL SALPINGO OOPHERECTOMY Bilateral 03/21/2014  ? Procedure: ROBOTIC ASSISTED TOTAL HYSTERECTOMY WITH BILATERAL SALPINGO OOPHORECTOMY ;  Surgeon: Janie Morning, MD;  [facility];  Service: Gynecology;  Laterality: Bilateral;  ? ? ?There were no vitals filed for this visit. ? ? Subjective Assessment - 06/06/21 0851   ? ? Subjective I feel a deep knot in the Lt gluteals.   I feel 80-85% better overall.  I would like to review a good routine that I can do after discharge next week.   ? Pertinent History Htn, osteoporosis   ? Patient Stated Goals To gain control of her back and hip pain and be able to resume her prior level of function   ? Currently in Pain? Yes   ? Pain Score 3    ? Pain Location Buttocks   ? Pain Orientation Left   ? Aggravating Factors  standing and walking long periods   ? Pain Relieving Factors ice, rest   ? ?  ?  ? ?  ? ? ? ? ? ? ? ? ? ? ? ? ? ? ? ? ? ? ? ?  [facility] Adult PT Treatment/Exercise - 06/06/21 0001   ? ?  ? Lumbar Exercises: Stretches  ? Active Hamstring Stretch Left;Right;3 reps;20 seconds   ? Active Hamstring Stretch Limitations Seated   ? Other Lumbar Stretch Exercise Seated cat cow 10x, VC for more lumbar extension   Much improved lumbar extension  ?  ? Lumbar Exercises: Standing  ? Functional Squats Limitations Lateral steps LT with teal pod on first step 2x10   ? Lifting Limitations LTLE single leg hinge: 3# to 2 blue pillows 10x holding 3# wt and RTUE holdingonto counter top   ? Other Standing Lumbar Exercises Hip abd 10x2  2.5# VC for very slow speed to concentrate on activating glute medius AND  stay lifted in her stance leg   ?  ? Lumbar Exercises: Seated  ? Long [employer] on Chair Strengthening;Both;1 set;10 reps;Weights   ? LAQ on Chair Weights (lbs) 3   ? LAQ on Chair Limitations lumbar support given   ?  ? Manual Therapy  ? Manual Therapy Soft tissue mobilization;Myofascial release   ? Manual therapy  comments Biofreeze applied post session to Lt gluteals   ? Soft tissue mobilization Addaday to bil gluteals and lumbar spine   ? ?  ?  ? ?  ? ? ? ? ? ? ? ? ? ? ? ? PT Short Term Goals - 05/23/21 0853   ? ?  ? PT SHORT TERM GOAL #1  ? Title be independent in initial HEP   ? Status Achieved   ?  ? PT SHORT TERM GOAL #2  ? Title Patient to be able to walk 15 min without pain or need to stop and rest.   ? Status Achieved   ?  ? PT SHORT TERM GOAL #3  ? Title Pt will be able to demo improved posture in sitting and standing without discomfort with neck and scapular retraction   ? Status Achieved   ? ?  ?  ? ?  ? ? ? ? PT Long Term Goals - 05/29/21 0853   ? ?  ? PT LONG TERM GOAL #1  ? Title be independent in advanced HEP   ? Time 4   ? Period Weeks   ? Status On-going   ? Target Date 06/26/21   ?  ? PT LONG TERM GOAL #2  ? Title Patient will report pain < or = to  at no greater than 1/10 in the Lt gluteals with standing and walking   ? Baseline 3/10   ? Time 4   ? Period Weeks   ? Status On-going   ? Target Date 06/26/21   ?  ? PT LONG TERM GOAL #3  ? Title Patient will be able to resume walking program with her friends   ? Baseline walked 2 miles with a friend- no limitation   ? Status Achieved   ?  ? PT LONG TERM GOAL #4  ? Title FOTO to improve to 69   ? Baseline 59   ? Time 4   ? Period Weeks   ? Status On-going   ? Target Date 06/26/21   ? ?  ?  ? ?  ? ? ? ? ? ? ? ? Plan - 06/06/21 0903   ? ? Clinical Impression Statement Pt reports 80-85% overall improvement since the start of care.  She agrees to D/C next week and would like to spend her next session building a ?routine? for home that  she can follow with her HE.  Pt arrives with continued Lt gluteal pain that is present with longer periods of standing but doesn?t significantly limit her activity. PT provided verbal and tactile cues for level pelvis with exercise today.  Pt will D/C next week to HEP,   ? PT Frequency 2x / week   ? PT Treatment/Interventions  ADLs/Self Care Home Management;Aquatic Therapy;Moist Heat;Iontophoresis 77m/ml Dexamethasone;Electrical Stimulation;Cryotherapy;Ultrasound;Gait training;Therapeutic exercise;Therapeutic activities;Functional mobility training;Stair training;Balance training;Neuromuscular re-education;Patient/family education;Manual techniques;Passive range of motion;Taping;Dry needling   ? PT Next Visit Plan Continue to advance hip and core strength, DN as needed. Pt is prepared for discharge after her final 2 visits.  Build HEP routine for patient next session, DN on final session by PT.   ? PT Home Exercise Plan Access Code: [email or web address]   ? Consulted and Agree with Plan of Care Patient   ? ?  ?  ? ?  ? ? ?Patient will benefit from skilled therapeutic intervention in order to improve the following deficits and impairments:  Abnormal gait, Decreased balance, Decreased mobility, Difficulty walking, Decreased strength, Increased fascial restricitons, Impaired flexibility, Postural dysfunction, Pain ? ?Visit Diagnosis: ?Abnormal posture ? ?Lumbar degenerative disc disease ? ?Muscle weakness (generalized) ? ? ? ? ?Problem List ?Patient Active Problem List  ? Diagnosis Date Noted  ? Age-related osteoporosis without current pathological fracture 03/19/2020  ? Personal history of colonic polyps 03/19/2020  ? Transient cerebral ischemia 09/15/2017  ? Pure hypercholesterolemia 09/15/2017  ? Pulmonary nodule 09/15/2017  ? Hypertension 09/15/2017  ? Gastro-esophageal reflux disease without esophagitis 09/15/2017  ? VAIN I (vaginal intraepithelial neoplasia grade I) 06/23/2017  ? Genetic testing 05/23/2014  ? History of breast cancer   ? Family history of breast cancer   ? Endometrial cancer ([place] 03/21/2014  ? Uterine cancer ([place] 03/21/2014  ? S/P mastectomy, bilateral 05/02/2013  ? Situational stress 05/02/2013  ? Osteoporosis, unspecified 05/02/2013  ? Breast cancer, [place] breast ([place] 05/02/2013  ? ?KSigurd Sos PT ?06/06/21 9:31 AM  ?[facility] ?[facility] [place] [address]?Phone: [phone]  Fax:  [phone]? ?Name: BJANICIA MONTERROSA?MRN: [id number]?Date of Birth: [birth year]?

## 2021-06-10 ENCOUNTER — Encounter: Payer: Self-pay | Admitting: Physical Therapy

## 2021-06-10 ENCOUNTER — Other Ambulatory Visit: Payer: Self-pay

## 2021-06-10 ENCOUNTER — Ambulatory Visit: Payer: Medicare HMO | Admitting: Physical Therapy

## 2021-06-10 DIAGNOSIS — M6281 Muscle weakness (generalized): Secondary | ICD-10-CM | POA: Diagnosis not present

## 2021-06-10 DIAGNOSIS — R293 Abnormal posture: Secondary | ICD-10-CM

## 2021-06-10 DIAGNOSIS — R262 Difficulty in walking, not elsewhere classified: Secondary | ICD-10-CM | POA: Diagnosis not present

## 2021-06-10 DIAGNOSIS — M5136 Other intervertebral disc degeneration, lumbar region: Secondary | ICD-10-CM

## 2021-06-10 NOTE — Therapy (Signed)
[facility] ?[facility] ?[place], [place], [address] ?Phone: [phone]   Fax:  [phone] ? ?Physical Therapy Treatment ? ?Patient Details  ?Name: ALAISHA Simon ?MRN: [id number] ?Date of Birth: 03/08/1941 ?Referring Provider (PT): Dr. Wandra Feinstein ? ? ?Encounter Date: 06/10/2021 ? ? PT End of Session - 06/10/21 7858   ? ? Visit Number 17   ? Date for PT Re-Evaluation 06/26/21   ? Authorization Type Aetna Medicare   ? Progress Note Due on Visit 20   ? PT Start Time [phone]   ? PT Stop Time 0930   ? PT Time Calculation (min) 38 min   ? Activity Tolerance Patient tolerated treatment well   ? Behavior During Therapy [facility] for tasks assessed/performed   ? ?  ?  ? ?  ? ? ?Past Medical History:  ?Diagnosis Date  ? Adenomatous polyps 01/2016  ? Anemia   ? Breast cancer ([place]) 1990  ? bilateral mastectomy, adenoca breast-left MRM, reconstruction, chemo  ? Bronchitis last 2 weeks  ? saw dr Felipa Eth 02-28-2014, he said no antibiotics needed, nonproductive cough  ? Complication of anesthesia   ? Cystitis   ? cytoxen, had once or twice  ? Family history of breast cancer   ? History of breast cancer   ? History of radiation therapy 2/10, 2/12, 2/18, 2/24, 05/29/14  ? vaginal cuff/ 30 Gy/ 5 fx  ? Hypertension   ? Macular degeneration   ? Neck pain   ? taking physictal therapy last 2 weeks  ? Osteoporosis   ? On Prolia.  ? PONV (postoperative nausea and vomiting)   ? Shoulder pain   ? taking physical therapy for last few weeks  ? Uterine cancer ([place]) 03/21/2014  ? MLH1/PMS2 LOH  ? Uterine fibroid   ? VAIN I (vaginal intraepithelial neoplasia grade I) 2018  ? positive HR HPV.  ? Vasovagal syncopes   ? ? ?Past Surgical History:  ?Procedure Laterality Date  ? BREAST IMPLANT REMOVAL  6/94  ? left breast  ? BREAST RECONSTRUCTION  1990  ? left  ? CATARACT EXTRACTION Bilateral   ? EXCISION VAGINAL CYST    ? MASTECTOMY Right 10/08/06  ? prophylactic  ? RADICAL MASTECTOMY LND  1990  ?  left, chemo done  ? ROBOTIC ASSISTED TOTAL HYSTERECTOMY WITH BILATERAL SALPINGO OOPHERECTOMY Bilateral 03/21/2014  ? Procedure: ROBOTIC ASSISTED TOTAL HYSTERECTOMY WITH BILATERAL SALPINGO OOPHORECTOMY ;  Surgeon: Janie Morning, MD;  [facility];  Service: Gynecology;  Laterality: Bilateral;  ? ? ?There were no vitals filed for this visit. ? ? Subjective Assessment - 06/10/21 0909   ? ? Subjective My Lt side has been better, my knot is better, still comes back but not as often.   ? Currently in Pain? No/denies   ? ?  ?  ? ?  ? ? ? ? ? ? ? ? ? ? ? ? ? ? ? ? ? ? ? ? [facility] Adult PT Treatment/Exercise - 06/10/21 0001   ? ?  ? Lumbar Exercises: Stretches  ? Active Hamstring Stretch Left;Right;3 reps;20 seconds   ? Active Hamstring Stretch Limitations Seated   ? Other Lumbar Stretch Exercise Seated cat cow 10x, VC for more lumbar extension   Much improved lumbar extension  ?  ? Lumbar Exercises: Standing  ? Functional Squats Limitations Lateral steps LT with teal pod on first step 2x10   ? Lifting Limitations LTLE single leg hinge: 3#  to 2 blue pillows 10x holding 3# wt and RTUE holdingonto counter top   VC to slow the standing portion  ? Other Standing Lumbar Exercises Side stepping yellow loop 6 lengths 2x   ?  ? Lumbar Exercises: Seated  ? Long [employer] on Chair Strengthening;Both;10 reps;Weights;2 sets   ? LAQ on Chair Weights (lbs) 3   ? LAQ on Chair Limitations lumbar support given   ?  ? Manual Therapy  ? Manual Therapy Soft tissue mobilization;Myofascial release   ? Soft tissue mobilization Addaday to bil gluteals and lumbar spine   ? ?  ?  ? ?  ? ? ? ? ? ? ? ? ? ? ? ? PT Short Term Goals - 05/23/21 0853   ? ?  ? PT SHORT TERM GOAL #1  ? Title be independent in initial HEP   ? Status Achieved   ?  ? PT SHORT TERM GOAL #2  ? Title Patient to be able to walk 15 min without pain or need to stop and rest.   ? Status Achieved   ?  ? PT SHORT TERM GOAL #3  ? Title Pt will be able to demo improved posture in sitting  and standing without discomfort with neck and scapular retraction   ? Status Achieved   ? ?  ?  ? ?  ? ? ? ? PT Long Term Goals - 05/29/21 0853   ? ?  ? PT LONG TERM GOAL #1  ? Title be independent in advanced HEP   ? Time 4   ? Period Weeks   ? Status On-going   ? Target Date 06/26/21   ?  ? PT LONG TERM GOAL #2  ? Title Patient will report pain < or = to  at no greater than 1/10 in the Lt gluteals with standing and walking   ? Baseline 3/10   ? Time 4   ? Period Weeks   ? Status On-going   ? Target Date 06/26/21   ?  ? PT LONG TERM GOAL #3  ? Title Patient will be able to resume walking program with her friends   ? Baseline walked 2 miles with a friend- no limitation   ? Status Achieved   ?  ? PT LONG TERM GOAL #4  ? Title FOTO to improve to 69   ? Baseline 59   ? Time 4   ? Period Weeks   ? Status On-going   ? Target Date 06/26/21   ? ?  ?  ? ?  ? ? ? ? ? ? ? ? Plan - 06/10/21 0854   ? ? Clinical Impression Statement Review of final HEP for todays session. Pt was given pictures to take home. Pt reports the "knot" in her Lt lumbar pelvic region is better.   ? Personal Factors and Comorbidities Age;Comorbidity 1;Comorbidity 2   ? Comorbidities Htn, osteoporosis   ? Examination-Activity Limitations Stand;Squat   ? Examination-Participation Restrictions Community Activity;Cleaning;Laundry   ? Stability/Clinical Decision Making Evolving/Moderate complexity   ? PT Frequency 2x / week   ? PT Duration 4 weeks   ? PT Treatment/Interventions ADLs/Self Care Home Management;Aquatic Therapy;Moist Heat;Iontophoresis 64m/ml Dexamethasone;Electrical Stimulation;Cryotherapy;Ultrasound;Gait training;Therapeutic exercise;Therapeutic activities;Functional mobility training;Stair training;Balance training;Neuromuscular re-education;Patient/family education;Manual techniques;Passive range of motion;Taping;Dry needling   ? PT Next Visit Plan Dc next session   ? PT Home Exercise Plan Access Code: [email or web address]   ? Consulted and Agree with  Plan of Care Patient   ? ?  ?  ? ?  ? ? ?  Patient will benefit from skilled therapeutic intervention in order to improve the following deficits and impairments:  Abnormal gait, Decreased balance, Decreased mobility, Difficulty walking, Decreased strength, Increased fascial restricitons, Impaired flexibility, Postural dysfunction, Pain ? ?Visit Diagnosis: ?Abnormal posture ? ?Lumbar degenerative disc disease ? ?Muscle weakness (generalized) ? ? ? ? ?Problem List ?Patient Active Problem List  ? Diagnosis Date Noted  ? Age-related osteoporosis without current pathological fracture 03/19/2020  ? Personal history of colonic polyps 03/19/2020  ? Transient cerebral ischemia 09/15/2017  ? Pure hypercholesterolemia 09/15/2017  ? Pulmonary nodule 09/15/2017  ? Hypertension 09/15/2017  ? Gastro-esophageal reflux disease without esophagitis 09/15/2017  ? VAIN I (vaginal intraepithelial neoplasia grade I) 06/23/2017  ? Genetic testing 05/23/2014  ? History of breast cancer   ? Family history of breast cancer   ? Endometrial cancer ([place]) 03/21/2014  ? Uterine cancer ([place]) 03/21/2014  ? S/P mastectomy, bilateral 05/02/2013  ? Situational stress 05/02/2013  ? Osteoporosis, unspecified 05/02/2013  ? Breast cancer, left breast ([place]) 05/02/2013  ? ? ?[place], PTA ?06/10/2021, 9:30 AM ? ?[facility] ?[facility] @ [facility] ?[place], [place], [address] ?Phone: [phone]   Fax:  [phone] ? ?Name: Beth Simon ?MRN: [id number] ?Date of Birth: Dec 16, 1940 ? ? ? ?

## 2021-06-12 ENCOUNTER — Other Ambulatory Visit: Payer: Self-pay

## 2021-06-12 ENCOUNTER — Ambulatory Visit: Payer: Medicare HMO

## 2021-06-12 DIAGNOSIS — R262 Difficulty in walking, not elsewhere classified: Secondary | ICD-10-CM | POA: Diagnosis not present

## 2021-06-12 DIAGNOSIS — M6281 Muscle weakness (generalized): Secondary | ICD-10-CM | POA: Diagnosis not present

## 2021-06-12 DIAGNOSIS — M5136 Other intervertebral disc degeneration, lumbar region: Secondary | ICD-10-CM

## 2021-06-12 DIAGNOSIS — R293 Abnormal posture: Secondary | ICD-10-CM

## 2021-06-12 NOTE — Therapy (Signed)
[facility] ?[facility] ?[place], [place], [address] ?Phone: [phone]   Fax:  [phone] ? ?Physical Therapy Treatment ? ?Patient Details  ?Name: Beth Simon ?MRN: [id number] ?Date of Birth: 07-Oct-1940 ?Referring Provider (PT): Dr. Wandra Feinstein ? ? ?Encounter Date: 06/12/2021 ? ? PT End of Session - 06/12/21 0936   ? ? Visit Number [id number]   ? PT Start Time [phone]   late and DN  ? PT Stop Time 0930   ? PT Time Calculation (min) 39 min   ? Activity Tolerance Patient tolerated treatment well   ? Behavior During Therapy [facility] for tasks assessed/performed   ? ?  ?  ? ?  ? ? ?Past Medical History:  ?Diagnosis Date  ? Adenomatous polyps 01/2016  ? Anemia   ? Breast cancer ([place]) 1990  ? bilateral mastectomy, adenoca breast-left MRM, reconstruction, chemo  ? Bronchitis last 2 weeks  ? saw dr Felipa Eth 02-28-2014, he said no antibiotics needed, nonproductive cough  ? Complication of anesthesia   ? Cystitis   ? cytoxen, had once or twice  ? Family history of breast cancer   ? History of breast cancer   ? History of radiation therapy 2/10, 2/12, 2/18, 2/24, 05/29/14  ? vaginal cuff/ 30 Gy/ 5 fx  ? Hypertension   ? Macular degeneration   ? Neck pain   ? taking physictal therapy last 2 weeks  ? Osteoporosis   ? On Prolia.  ? PONV (postoperative nausea and vomiting)   ? Shoulder pain   ? taking physical therapy for last few weeks  ? Uterine cancer ([place]) 03/21/2014  ? MLH1/PMS2 LOH  ? Uterine fibroid   ? VAIN I (vaginal intraepithelial neoplasia grade I) 2018  ? positive HR HPV.  ? Vasovagal syncopes   ? ? ?Past Surgical History:  ?Procedure Laterality Date  ? BREAST IMPLANT REMOVAL  6/94  ? left breast  ? BREAST RECONSTRUCTION  1990  ? left  ? CATARACT EXTRACTION Bilateral   ? EXCISION VAGINAL CYST    ? MASTECTOMY Right 10/08/06  ? prophylactic  ? RADICAL MASTECTOMY LND  1990  ? left, chemo done  ? ROBOTIC ASSISTED TOTAL HYSTERECTOMY WITH BILATERAL SALPINGO OOPHERECTOMY  Bilateral 03/21/2014  ? Procedure: ROBOTIC ASSISTED TOTAL HYSTERECTOMY WITH BILATERAL SALPINGO OOPHORECTOMY ;  Surgeon: Janie Morning, MD;  Location: [facility];  Service: Gynecology;  Laterality: Bilateral;  ? ? ?There were no vitals filed for this visit. ? ? Subjective Assessment - 06/12/21 0856   ? ? Subjective I feel 85% better overall.  Ready to D/C to HEP.   ? Currently in Pain? Yes   ? Pain Score 1    ? Pain Location Buttocks   ? Pain Orientation Left   ? Pain Type Chronic pain   ? Pain Onset More than a month ago   ? Pain Frequency Intermittent   ? Aggravating Factors  standing and walking long periods   ? Pain Relieving Factors ice/ rest   ? ?  ?  ? ?  ? ? ? ? ? [facility] PT Assessment - 06/12/21 0001   ? ?  ? Assessment  ? Medical Diagnosis Lumbar DDD   ? Referring Provider (PT) Dr. Wandra Feinstein   ? Onset Date/Surgical Date 08/27/20   ?  ? Observation/Other Assessments  ? Focus on Therapeutic Outcomes (FOTO)  63   ? ?  ?  ? ?  ? ? ? ? ? ? ? ? ? ? ? ? ? ? ? ?  [facility] Adult PT Treatment/Exercise - 06/12/21 0001   ? ?  ? Lumbar Exercises: Stretches  ? Active Hamstring Stretch Left;Right;3 reps;20 seconds   ? Active Hamstring Stretch Limitations Seated   ?  ? Lumbar Exercises: Standing  ? Functional Squats Limitations Lateral steps LT with teal pod on first step 2x10   ?  ? Lumbar Exercises: Seated  ? Long [employer] on Chair Strengthening;Both;10 reps;Weights;2 sets   ? LAQ on Chair Weights (lbs) 3   ? LAQ on Chair Limitations lumbar support given   ?  ? Manual Therapy  ? Manual Therapy Soft tissue mobilization;Myofascial release   ? Manual therapy comments skilled palpation and monitoring with DN today.   ? Soft tissue mobilization elongation and trigger point release to bil lumbar spine and gluteals   ? ?  ?  ? ?  ? ? ? Trigger Point Dry Needling - 06/12/21 0001   ? ? Consent Given? Yes   ? Education Handout Provided Previously provided   ? Muscles Treated Back/Hip Gluteus minimus;Gluteus medius;Lumbar multifidi   ?  Gluteus Minimus Response Twitch response elicited;Palpable increased muscle length   ? Gluteus Medius Response Twitch response elicited;Palpable increased muscle length   ? Lumbar multifidi Response Twitch response elicited;Palpable increased muscle length   ? ?  ?  ? ?  ? ? ? ? ? ? ? ? ? ? PT Short Term Goals - 05/23/21 0853   ? ?  ? PT SHORT TERM GOAL #1  ? Title be independent in initial HEP   ? Status Achieved   ?  ? PT SHORT TERM GOAL #2  ? Title Patient to be able to walk 15 min without pain or need to stop and rest.   ? Status Achieved   ?  ? PT SHORT TERM GOAL #3  ? Title Pt will be able to demo improved posture in sitting and standing without discomfort with neck and scapular retraction   ? Status Achieved   ? ?  ?  ? ?  ? ? ? ? PT Long Term Goals - 06/12/21 0857   ? ?  ? PT LONG TERM GOAL #1  ? Title be independent in advanced HEP   ? Status Achieved   ?  ? PT LONG TERM GOAL #2  ? Title Patient will report pain < or = to  at no greater than 1/10 in the Lt gluteals with standing and walking   ? Baseline 1-3/10   ? Status Partially Met   ?  ? PT LONG TERM GOAL #3  ? Title Patient will be able to resume walking program with her friends   ? Baseline walked 2 miles with a friend- no limitation, starting to walk faster now   ? Status Achieved   ?  ? PT LONG TERM GOAL #4  ? Title FOTO to improve to 69   ? Baseline 63   ? Status Partially Met   ? ?  ?  ? ?  ? ? ? ? ? ? ? ? Plan - 06/12/21 0904   ? ? Clinical Impression Statement Pt reports 85% overall improvement in her symptoms since the start of care.  Pt is walking with her friend regularly and denies limitation at this time.  Pt describes intermittent and brief Lt buttock pain with long standing and pt rates this as 1-3/10.  Pt has a comprehensive HEP and will continue with this for strength and flexibility gains.  Pt with  mild tension in the lumbar spine and Lt gluteal and had good response to DN with twitch and improved tissue mobility.  Pt will D/C today to  HEP.   ? PT Next Visit Plan D/C PT to HEP   ? PT Home Exercise Plan Access Code: [email or web address]   ? Consulted and Agree with Plan of Care Patient   ? ?  ?  ? ?  ? ? ?Patient will benefit from skilled therapeutic intervention in order to improve the following deficits and impairments:    ? ?Visit Diagnosis: ?Abnormal posture ? ?Lumbar degenerative disc disease ? ?Muscle weakness (generalized) ? ? ? ? ?Problem List ?Patient Active Problem List  ? Diagnosis Date Noted  ? Age-related osteoporosis without current pathological fracture 03/19/2020  ? Personal history of colonic polyps 03/19/2020  ? Transient cerebral ischemia 09/15/2017  ? Pure hypercholesterolemia 09/15/2017  ? Pulmonary nodule 09/15/2017  ? Hypertension 09/15/2017  ? Gastro-esophageal reflux disease without esophagitis 09/15/2017  ? VAIN I (vaginal intraepithelial neoplasia grade I) 06/23/2017  ? Genetic testing 05/23/2014  ? History of breast cancer   ? Family history of breast cancer   ? Endometrial cancer ([place]) 03/21/2014  ? Uterine cancer ([place]) 03/21/2014  ? S/P mastectomy, bilateral 05/02/2013  ? Situational stress 05/02/2013  ? Osteoporosis, unspecified 05/02/2013  ? Breast cancer, left breast ([place]) 05/02/2013  ? ?PHYSICAL THERAPY DISCHARGE SUMMARY ? ?Visits from Start of Care: 18 ? ?Current functional level related to goals / functional outcomes: ?See above for most current status.  Pt will d/c to HEP. ?  ?Remaining deficits: ?No functional deficits remain ?  ?Education / Equipment: ?HEP  ? ?Patient agrees to discharge. Patient goals were met. Patient is being discharged due to meeting the stated rehab goals. ?  ?Sigurd Sos, PT ?06/12/21 9:38 AM  ? ?[facility] ?[facility] ?[place], [place], [address] ?Phone: [phone]   Fax:  [phone] ? ?Name: AMONI SCALLAN ?MRN: [id number] ?Date of Birth: 06-Mar-1941 ? ? ? ?

## 2021-06-14 ENCOUNTER — Other Ambulatory Visit: Payer: Self-pay

## 2021-06-14 ENCOUNTER — Ambulatory Visit: Payer: Medicare HMO | Admitting: Podiatry

## 2021-06-14 ENCOUNTER — Encounter: Payer: Self-pay | Admitting: Podiatry

## 2021-06-14 DIAGNOSIS — Q828 Other specified congenital malformations of skin: Secondary | ICD-10-CM | POA: Diagnosis not present

## 2021-06-14 DIAGNOSIS — M79676 Pain in unspecified toe(s): Secondary | ICD-10-CM

## 2021-06-14 DIAGNOSIS — B351 Tinea unguium: Secondary | ICD-10-CM | POA: Diagnosis not present

## 2021-06-18 NOTE — Progress Notes (Signed)
Subjective: ?81 y.o. returns the office today for painful, elongated, thickened toenails which she is unable to trim herself as well as for a callus to her second digit on her left foot.  No open sores that she reports and denies any swelling or redness or any drainage.  She has not been cramping that she experienced into her leg is much improved.  She is doing physical therapy for other issues and they are working the leg as well at times.  No injuries or concerns otherwise.  ? ?Objective: ?AAO ?3, NAD ?DP/PT pulses palpable, CRT less than 3 seconds ?Nails hypertrophic, dystrophic, elongated, brittle, discolored ?10. There is tenderness overlying the nails 1-5 bilaterally. There is no surrounding erythema or drainage along the nail sites.  Continuation incurvation to the lesser digit toenails left side worse than right.   ?Hyperkeratotic lesions of the distal aspect left second digit. No underlying ulceration, drainage or other signs of infection.  ?No other areas of discomfort today. ?No pain with calf compression, swelling, warmth, erythema. ? ?Assessment: ?Symptomatic onychomycosis; hyperkeratotic lesion left foot; right leg cramping ? ?Plan: ?-Treatment options including alternatives, risks, complications were discussed ?-Nails sharply debrided ?10 without any complications or bleeding.  ?-Hyperkeratotic lesion sharply debrided ?1 without complication/bleeding. ?-Monitor for any clinical signs or symptoms of infection and directed to call the office immediately should any occur or go to the ER. ? ?Celesta Gentile, DPM ? ?

## 2021-07-12 ENCOUNTER — Ambulatory Visit: Payer: Medicare HMO | Admitting: Podiatry

## 2021-07-17 DIAGNOSIS — R5383 Other fatigue: Secondary | ICD-10-CM | POA: Diagnosis not present

## 2021-07-17 DIAGNOSIS — E559 Vitamin D deficiency, unspecified: Secondary | ICD-10-CM | POA: Diagnosis not present

## 2021-07-17 DIAGNOSIS — M810 Age-related osteoporosis without current pathological fracture: Secondary | ICD-10-CM | POA: Diagnosis not present

## 2021-07-19 ENCOUNTER — Ambulatory Visit: Payer: Medicare HMO | Admitting: Podiatry

## 2021-07-19 ENCOUNTER — Encounter: Payer: Self-pay | Admitting: Podiatry

## 2021-07-19 DIAGNOSIS — M7989 Other specified soft tissue disorders: Secondary | ICD-10-CM | POA: Diagnosis not present

## 2021-07-19 DIAGNOSIS — B351 Tinea unguium: Secondary | ICD-10-CM

## 2021-07-19 DIAGNOSIS — M79676 Pain in unspecified toe(s): Secondary | ICD-10-CM

## 2021-07-20 NOTE — Progress Notes (Signed)
Subjective: ?81 y.o. returns the office today for painful, elongated, thickened toenails which she is unable to trim herself as well as for a callus to her second digit on her left foot.  She has no new foot complaints today.  The cramping to the foot is been stable.  No worsening and seems to be doing somewhat better.  She is also has some chronic swelling present of the left leg for quite some time she states that she has previously seen a vein specialist.  She has been on her feet a lot more recently.  She does have compression socks which are too hard to put on and then she bought over-the-counter ones which do not fit well either.  No fevers or chills.  No other concerns.   ? ?Objective: ?AAO ?3, NAD ?DP/PT pulses palpable, CRT less than 3 seconds ?Venous insufficiency noted. ?Nails hypertrophic, dystrophic, elongated, brittle, discolored ?10. There is tenderness overlying the nails 1-5 bilaterally. There is no surrounding erythema or drainage along the nail sites.  Chronic continuation incurvation to the lesser digit toenails left side worse than right.   ?Hyperkeratotic lesions of the distal aspect left second digit. No underlying ulceration, drainage or other signs of infection.  ?No other areas of discomfort today. ?Hammertoes ?No pain with calf compression, warmth, erythema. ? ?Assessment: ?Symptomatic onychomycosis, ingrown toenails; hyperkeratotic lesion left foot; lower extremity edema left side worse than right ? ?Plan: ?-Treatment options including alternatives, risks, complications were discussed ?-Nails sharply debrided ?10 without any complications or bleeding.  ?-Hyperkeratotic lesion sharply debrided ?1 without complication/bleeding.  This is due to hammertoe deformity discussed offloading. ?-Prescription for compression socks written. ?-Monitor for any clinical signs or symptoms of infection and directed to call the office immediately should any occur or go to the ER. ? ?Celesta Gentile, DPM ? ?

## 2021-07-23 DIAGNOSIS — M810 Age-related osteoporosis without current pathological fracture: Secondary | ICD-10-CM | POA: Diagnosis not present

## 2021-07-23 DIAGNOSIS — M7541 Impingement syndrome of right shoulder: Secondary | ICD-10-CM | POA: Diagnosis not present

## 2021-08-16 ENCOUNTER — Ambulatory Visit: Payer: Medicare HMO | Admitting: Podiatry

## 2021-08-16 DIAGNOSIS — R1013 Epigastric pain: Secondary | ICD-10-CM | POA: Diagnosis not present

## 2021-08-16 DIAGNOSIS — R197 Diarrhea, unspecified: Secondary | ICD-10-CM | POA: Diagnosis not present

## 2021-08-16 DIAGNOSIS — R600 Localized edema: Secondary | ICD-10-CM | POA: Diagnosis not present

## 2021-08-19 DIAGNOSIS — R600 Localized edema: Secondary | ICD-10-CM | POA: Diagnosis not present

## 2021-08-19 DIAGNOSIS — I872 Venous insufficiency (chronic) (peripheral): Secondary | ICD-10-CM | POA: Diagnosis not present

## 2021-08-21 NOTE — Progress Notes (Signed)
81 y.o. G48P1001 Widowed Caucasian female here for annual exam.    Having digestive issues.  Can have epigastric pain after eating and then develops diarrhea. She is having fecal incontinence.   She has urinary incontinence and has done consultation with a pelvic floor therapist.  Has not been doing her exercises regularly.  Needs an Rx for a prosthetic bra.   PCP:   Lajean Manes, MD  Patient's last menstrual period was 03/31/1988 (approximate).           Sexually active: No.  The current method of family planning is status post hysterectomy.    Exercising: Yes.     Walk,Pilates Smoker:  no  Health Maintenance: Pap:  08-28-20 normal Neg HR HPV,Pap 08-24-19 Normal Neg HR HPV History of abnormal Pap:  yes 2018 Colpo VAIN 1 MMG:  Bilat.Mastectomy Colonoscopy:  2017 polyps no follow up needed BMD:   05-07-20  Result  T score -1.6 Low bone mass. Followed by Dr. Layne Benton.  TDaP:  2020 Gardasil:   no HIV:NR Hep C:Neg Screening Labs:  Hb today: PCP, Urine today: PCP   reports that she has never smoked. She has never used smokeless tobacco. She reports that she does not drink alcohol and does not use drugs.  Past Medical History:  Diagnosis Date   Adenomatous polyps 01/2016   Anemia    Breast cancer ([place]) 1990   bilateral mastectomy, adenoca breast-left MRM, reconstruction, chemo   Bronchitis last 2 weeks   saw dr Felipa Eth 02-28-2014, he said no antibiotics needed, nonproductive cough   Complication of anesthesia    Cystitis    cytoxen, had once or twice   Family history of breast cancer    History of breast cancer    History of radiation therapy 2/10, 2/12, 2/18, 2/24, 05/29/14   vaginal cuff/ 30 Gy/ 5 fx   Hypertension    Macular degeneration    Neck pain    taking physictal therapy last 2 weeks   Osteoporosis    On Prolia.   PONV (postoperative nausea and vomiting)    Shoulder pain    taking physical therapy for last few weeks   Uterine cancer ([place]) 03/21/2014    MLH1/PMS2 LOH   Uterine fibroid    VAIN I (vaginal intraepithelial neoplasia grade I) 2018   positive HR HPV.   Vasovagal syncopes     Past Surgical History:  Procedure Laterality Date   BREAST IMPLANT REMOVAL  6/94   left breast   BREAST RECONSTRUCTION  1990   left   CATARACT EXTRACTION Bilateral    EXCISION VAGINAL CYST     MASTECTOMY Right 10/08/06   prophylactic   RADICAL MASTECTOMY LND  1990   left, chemo done   ROBOTIC ASSISTED TOTAL HYSTERECTOMY WITH BILATERAL SALPINGO OOPHERECTOMY Bilateral 03/21/2014   Procedure: ROBOTIC ASSISTED TOTAL HYSTERECTOMY WITH BILATERAL SALPINGO OOPHORECTOMY ;  Surgeon: Janie Morning, MD;  Location: [facility];  Service: Gynecology;  Laterality: Bilateral;    Current Outpatient Medications  Medication Sig Dispense Refill   atorvastatin (LIPITOR) 10 MG tablet Take 10 mg by mouth daily.     Coenzyme Q10 (COQ10 PO) Take 1 tablet by mouth every morning.      Cyanocobalamin (VITAMIN B 12 PO) Take 1 tablet by mouth every morning.      denosumab (PROLIA) 60 MG/ML SOLN injection Inject 60 mg into the skin every 6 (six) months. Administer in upper arm, thigh, or abdomen     Flaxseed, Linseed, (FLAX SEEDS PO)  Take 1 scoop by mouth every morning. Ground flaxseed in cereal.     lisinopril (PRINIVIL,ZESTRIL) 5 MG tablet Take 5 mg by mouth daily.     Multiple Vitamins-Minerals (PRESERVISION AREDS) CAPS See admin instructions.     NONFORMULARY OR COMPOUNDED ITEM Prosthetic bra.  Please supply 2 bras.  Refill:  2. 60 each 1   OVER THE COUNTER MEDICATION Take 1 tablet by mouth every morning. Astazanthin. (anti-oxidant)     SHINGRIX injection      VITAMIN D, CHOLECALCIFEROL, PO Take 4,000 Int'l Units by mouth every morning.      celecoxib (CELEBREX) 100 MG capsule Take by mouth. (Patient not taking: Reported on 09/04/2021)     famotidine (PEPCID) 40 MG tablet Take 40 mg by mouth at bedtime as needed. (Patient not taking: Reported on 09/04/2021)     methocarbamol  (ROBAXIN) 500 MG tablet Take 500 mg by mouth every 8 (eight) hours as needed. (Patient not taking: Reported on 09/04/2021)     No current facility-administered medications for this visit.    Family History  Problem Relation Age of Onset   Breast cancer Sister 55       recurrence age 53   Breast cancer Paternal Grandmother    Heart disease Mother    Heart disease Father    Hypertension Sister    Dementia Sister    Breast cancer Maternal Aunt 68   Dementia Maternal Aunt        dx in her 80s    Review of Systems  Genitourinary:        Urinary incontinence   Exam:   BP 130/80 (BP Location: Right Arm, Patient Position: Sitting, Cuff Size: Normal)   Pulse 80   Ht $R'4\' 10"'cb$  (1.473 m)   Wt 134 lb (60.8 kg)   LMP 03/31/1988 (Approximate)   SpO2 98%   BMI 28.01 kg/m     General appearance: alert, cooperative and appears stated age Head: normocephalic, without obvious abnormality, atraumatic Neck: no adenopathy, supple, symmetrical, trachea midline and thyroid normal to inspection and palpation Lungs: clear to auscultation bilaterally Breasts: absent bilaterally.  No axillary adenopathy Heart: regular rate and rhythm Abdomen: soft, non-tender; no masses, no organomegaly Extremities: extremities normal, atraumatic, no cyanosis or edema Skin: skin color, texture, turgor normal. No rashes or lesions Lymph nodes: cervical, supraclavicular, and axillary nodes normal. Neurologic: grossly normal  Pelvic: External genitalia:  no lesions              No abnormal inguinal nodes palpated.              Urethra:  normal appearing urethra with no masses, tenderness or lesions              Bartholins and Skenes: normal                 Vagina: normal appearing vagina with normal color and discharge, no lesions              Cervix: absent              Pap taken: yes Bimanual Exam:  Uterus:  absent              Adnexa: no mass, fullness, tenderness              Rectal exam: Yes.  .  Confirms.               Anus:  normal sphincter tone, no lesions  Chaperone was  present for exam:  Maudie Mercury, CMA.   Assessment:   Well woman visit with gynecologic exam. Status post robotic hysterectomy with BSO.  Endometrioid uterine cancer, FIGO grade II.  Status post vaginal brachytherapy. Vaginal stenosis.  Colposcopy with vaginal biopsy 06/02/16 - VAIN I.  Urinary incontinence. Diarrhea.  Epigastric pain. Fecal soiling.  I suspect that her prior radiation therapy has contributed to her colon issues.  Status post bilateral mastectomy for breast cancer.  Osteopenia.  On  Prolia through Dr.  Layne Benton.   Plan: Mammogram screening discussed. Self breast awareness reviewed. Pap and reflex HR HPV collected.  Guidelines for Calcium, Vitamin D, regular exercise program including cardiovascular and weight bearing exercise. Rx for prosthetic bra.  Referral to GI.  Follow up annually and prn.   After visit summary provided.   32 min  total time was spent for this patient encounter, including preparation, face-to-face counseling with the patient, coordination of care, and documentation of the encounter.

## 2021-08-28 DIAGNOSIS — I83893 Varicose veins of bilateral lower extremities with other complications: Secondary | ICD-10-CM | POA: Diagnosis not present

## 2021-09-02 ENCOUNTER — Ambulatory Visit: Payer: Medicare HMO | Admitting: Podiatry

## 2021-09-02 DIAGNOSIS — Q828 Other specified congenital malformations of skin: Secondary | ICD-10-CM | POA: Diagnosis not present

## 2021-09-02 DIAGNOSIS — B351 Tinea unguium: Secondary | ICD-10-CM | POA: Diagnosis not present

## 2021-09-02 DIAGNOSIS — M79676 Pain in unspecified toe(s): Secondary | ICD-10-CM

## 2021-09-04 ENCOUNTER — Ambulatory Visit (INDEPENDENT_AMBULATORY_CARE_PROVIDER_SITE_OTHER): Payer: Medicare HMO | Admitting: Obstetrics and Gynecology

## 2021-09-04 ENCOUNTER — Other Ambulatory Visit (HOSPITAL_COMMUNITY)
Admission: RE | Admit: 2021-09-04 | Discharge: 2021-09-04 | Disposition: A | Discharge status: Home / Self Care | Payer: Medicare HMO | Source: Ambulatory Visit | Attending: Obstetrics and Gynecology | Admitting: Obstetrics and Gynecology

## 2021-09-04 ENCOUNTER — Encounter: Payer: Self-pay | Admitting: Obstetrics and Gynecology

## 2021-09-04 VITALS — BP 130/80 | HR 80 | Ht <= 58 in | Wt 134.0 lb

## 2021-09-04 DIAGNOSIS — R159 Full incontinence of feces: Secondary | ICD-10-CM | POA: Diagnosis not present

## 2021-09-04 DIAGNOSIS — Z01419 Encounter for gynecological examination (general) (routine) without abnormal findings: Secondary | ICD-10-CM

## 2021-09-04 DIAGNOSIS — Z124 Encounter for screening for malignant neoplasm of cervix: Secondary | ICD-10-CM | POA: Insufficient documentation

## 2021-09-04 DIAGNOSIS — Z8542 Personal history of malignant neoplasm of other parts of uterus: Secondary | ICD-10-CM | POA: Diagnosis not present

## 2021-09-04 DIAGNOSIS — Z9189 Other specified personal risk factors, not elsewhere classified: Secondary | ICD-10-CM | POA: Diagnosis not present

## 2021-09-04 DIAGNOSIS — R197 Diarrhea, unspecified: Secondary | ICD-10-CM | POA: Diagnosis not present

## 2021-09-04 DIAGNOSIS — R1013 Epigastric pain: Secondary | ICD-10-CM | POA: Diagnosis not present

## 2021-09-04 DIAGNOSIS — Z853 Personal history of malignant neoplasm of breast: Secondary | ICD-10-CM | POA: Diagnosis not present

## 2021-09-04 MED ORDER — NONFORMULARY OR COMPOUNDED ITEM: 1 refills | Status: DC

## 2021-09-04 NOTE — Patient Instructions (Signed)

## 2021-09-05 NOTE — Progress Notes (Signed)
Subjective: 81 y.o. returns the office today for painful, elongated, thickened toenails which she is unable to trim herself as well as for a callus to her second digit on her left foot.  She felt the vein center for her swelling in the legs.  She has no new foot complaints today.     Objective: AAO 3, NAD DP/PT pulses palpable, CRT less than 3 seconds Venous insufficiency noted. Nails hypertrophic, dystrophic, elongated, brittle, discolored 10. There is tenderness overlying the nails 1-5 bilaterally. There is no surrounding erythema or drainage along the nail sites.  Chronic continuation incurvation to the lesser digit toenails left side worse than right.   Hyperkeratotic lesions of the distal aspect left second digit. No underlying ulceration, drainage or other signs of infection.  No other areas of discomfort today. Hammertoes No pain with calf compression, warmth, erythema.  Assessment: Symptomatic onychomycosis, ingrown toenails; hyperkeratotic lesion left foot; lower extremity edema left side worse than right  Plan: -Treatment options including alternatives, risks, complications were discussed -Nails sharply debrided 10 without any complications or bleeding.  -Hyperkeratotic lesion sharply debrided 1 without complication/bleeding.  This is due to hammertoe deformity discussed offloading. -Monitor for any clinical signs or symptoms of infection and directed to call the office immediately should any occur or go to the ER.  Celesta Gentile, DPM

## 2021-09-06 DIAGNOSIS — I872 Venous insufficiency (chronic) (peripheral): Secondary | ICD-10-CM | POA: Diagnosis not present

## 2021-09-06 DIAGNOSIS — R600 Localized edema: Secondary | ICD-10-CM | POA: Diagnosis not present

## 2021-09-06 LAB — CYTOLOGY - PAP: Diagnosis: NEGATIVE

## 2021-09-17 ENCOUNTER — Telehealth: Payer: Self-pay

## 2021-09-17 NOTE — Telephone Encounter (Signed)
Patient did not have lymph node removal but she did have radiation to the pelvis.   If she is having acute swelling of a lower extremity, I recommend she see her PCP or go to the ER for further evaluation.

## 2021-09-17 NOTE — Telephone Encounter (Signed)
Pt calling to inquire if she had any lymph nodes removed from pelvis during hysterectomy done in 2015?  Pt reports she had been having some swelling in her leg recently and her provider has been stating that its not coming from circulatory but from lymphatic system and she is curious if there were any removed in procedure and/or somehow could be contributing to current sxs.   Please advise.

## 2021-09-18 NOTE — Telephone Encounter (Signed)
FYI. Pt notified and voiced understanding. Pt states she has seen and mentioned her concerns for the leg swelling to her PCP (he ruled out any trouble to her heart/lungs) and to Vein/Vascular specialist (Dr. Aleda Grana) and is returning to see Dr. Aleda Grana next week to give him the information re: h/o radiation to pelvis.   Reports she hasnt been referred to any other specialist re: her concerns as of yet but has been given recommendations as far as wearing knee-high stockings and massages. I advised her I will let you know this information and if there is any other recommendations by you then I will let her know. She voiced understanding.

## 2021-09-18 NOTE — Telephone Encounter (Signed)
Thank you for the follow up.  You may close this encounter.

## 2021-10-04 DIAGNOSIS — I83892 Varicose veins of left lower extremities with other complications: Secondary | ICD-10-CM | POA: Diagnosis not present

## 2021-10-07 ENCOUNTER — Ambulatory Visit: Payer: Medicare HMO | Admitting: Podiatry

## 2021-10-07 DIAGNOSIS — B351 Tinea unguium: Secondary | ICD-10-CM | POA: Diagnosis not present

## 2021-10-07 DIAGNOSIS — M79676 Pain in unspecified toe(s): Secondary | ICD-10-CM | POA: Diagnosis not present

## 2021-10-07 DIAGNOSIS — I890 Lymphedema, not elsewhere classified: Secondary | ICD-10-CM

## 2021-10-07 DIAGNOSIS — Q828 Other specified congenital malformations of skin: Secondary | ICD-10-CM

## 2021-10-08 NOTE — Progress Notes (Signed)
Subjective: 81 y.o. returns the office today for painful, elongated, thickened toenails which she is unable to trim herself as well as for a callus to her second digit on her left foot.  She felt the vein center for her swelling in the legs.  She was told is not a vein issue it is more from lymphedema.  She has been referred to physical therapy for this.  She can try now work massaging this after she did some Patent examiner.   Objective: AAO 3, NAD DP/PT pulses palpable, CRT less than 3 seconds Chronic swelling present bilaterally.  There is no open lesions. Nails hypertrophic, dystrophic, elongated, brittle, discolored 10. There is tenderness overlying the nails 1-5 bilaterally. There is no surrounding erythema or drainage along the nail sites.  Chronic continuation incurvation to the lesser digit toenails left side worse than right.   Hyperkeratotic lesions of the distal aspect left second digit. No underlying ulceration, drainage or other signs of infection.  No other areas of discomfort today. Hammertoes No pain with calf compression, warmth, erythema.  Assessment: Symptomatic onychomycosis, ingrown toenails; hyperkeratotic lesion left foot; lower extremity edema left side worse than right  Plan: -Treatment options including alternatives, risks, complications were discussed -Nails sharply debrided 10 without any complications or bleeding.  -Hyperkeratotic lesion sharply debrided 1 without complication/bleeding.  This is due to hammertoe deformity discussed offloading. -Physical therapy for lymphatic massage -Monitor for any clinical signs or symptoms of infection and directed to call the office immediately should any occur or go to the ER.  Celesta Gentile, DPM

## 2021-11-07 ENCOUNTER — Ambulatory Visit: Payer: Medicare HMO | Admitting: Podiatry

## 2021-11-12 ENCOUNTER — Ambulatory Visit: Payer: Medicare HMO | Attending: Family Medicine | Admitting: Physical Therapy

## 2021-11-12 ENCOUNTER — Encounter: Payer: Self-pay | Admitting: Physical Therapy

## 2021-11-12 ENCOUNTER — Other Ambulatory Visit: Payer: Self-pay

## 2021-11-12 DIAGNOSIS — Z8542 Personal history of malignant neoplasm of other parts of uterus: Secondary | ICD-10-CM

## 2021-11-12 DIAGNOSIS — I890 Lymphedema, not elsewhere classified: Secondary | ICD-10-CM | POA: Diagnosis not present

## 2021-11-12 NOTE — Therapy (Signed)
OUTPATIENT PHYSICAL THERAPY ONCOLOGY EVALUATION  Patient Name: GENNIFER POTENZA MRN: [id number] DOB:12-18-1940, 81 y.o., female Today's Date: 11/12/2021   PT End of Session - 11/12/21 1358     Visit Number 1    Number of Visits 13    Date for PT Re-Evaluation 12/24/21    PT Start Time 1304    PT Stop Time 1345    PT Time Calculation (min) 41 min    Activity Tolerance Patient tolerated treatment well    Behavior During Therapy [facility] for tasks assessed/performed             Past Medical History:  Diagnosis Date   Adenomatous polyps 01/2016   Anemia    Breast cancer ([place]) 1990   bilateral mastectomy, adenoca breast-left MRM, reconstruction, chemo   Bronchitis last 2 weeks   saw dr Felipa Eth 02-28-2014, he said no antibiotics needed, nonproductive cough   Complication of anesthesia    Cystitis    cytoxen, had once or twice   Family history of breast cancer    History of breast cancer    History of radiation therapy 2/10, 2/12, 2/18, 2/24, 05/29/14   vaginal cuff/ 30 Gy/ 5 fx   Hypertension    Macular degeneration    Neck pain    taking physictal therapy last 2 weeks   Osteoporosis    On Prolia.   PONV (postoperative nausea and vomiting)    Shoulder pain    taking physical therapy for last few weeks   Uterine cancer ([place]) 03/21/2014   MLH1/PMS2 LOH   Uterine fibroid    VAIN I (vaginal intraepithelial neoplasia grade I) 2018   positive HR HPV.   Vasovagal syncopes    Past Surgical History:  Procedure Laterality Date   BREAST IMPLANT REMOVAL  6/94   left breast   BREAST RECONSTRUCTION  1990   left   CATARACT EXTRACTION Bilateral    EXCISION VAGINAL CYST     MASTECTOMY Right 10/08/06   prophylactic   RADICAL MASTECTOMY LND  1990   left, chemo done   ROBOTIC ASSISTED TOTAL HYSTERECTOMY WITH BILATERAL SALPINGO OOPHERECTOMY Bilateral 03/21/2014   Procedure: ROBOTIC ASSISTED TOTAL HYSTERECTOMY WITH BILATERAL SALPINGO OOPHORECTOMY ;  Surgeon: Janie Morning, MD;   Location: [facility];  Service: Gynecology;  Laterality: Bilateral;   Patient Active Problem List   Diagnosis Date Noted   Age-related osteoporosis without current pathological fracture 03/19/2020   Personal history of colonic polyps 03/19/2020   Transient cerebral ischemia 09/15/2017   Pure hypercholesterolemia 09/15/2017   Pulmonary nodule 09/15/2017   Hypertension 09/15/2017   Gastro-esophageal reflux disease without esophagitis 09/15/2017   VAIN I (vaginal intraepithelial neoplasia grade I) 06/23/2017   Genetic testing 05/23/2014   History of breast cancer    Family history of breast cancer    Endometrial cancer ([place]) 03/21/2014   Uterine cancer ([place]) 03/21/2014   S/P mastectomy, bilateral 05/02/2013   Situational stress 05/02/2013   Osteoporosis, unspecified 05/02/2013   Breast cancer, left breast ([place]) 05/02/2013    PCP: Lajean Manes, MD  REFERRING PROVIDER: Linus Mako, MD   REFERRING DIAG: I89.0 (ICD-10-CM) - Lymphedema, not elsewhere classified   THERAPY DIAG:  Lymphedema, not elsewhere classified  History of uterine cancer  ONSET DATE: 05/01/21  Rationale for Evaluation and Treatment Rehabilitation  SUBJECTIVE  SUBJECTIVE STATEMENT: The vein doctor did not feel like it was vascular. I learned you could do self massage. I learned it might be related due to having cancer surgeries. The swelling is from the knee down.   PERTINENT HISTORY:  History notable for left breast cancer ER-PR-  treated with radical mastectomy and chemotherapy in 1990.  Subsequently underwent right simple mastectomy 2008 without reconstruction. Denies use of tamoxifen   On 03/21/14  she underwent a robotic hysterectomy for treatment of uterine cancer, BSO. Frozen section revealed grade 1 cancer. Final  pathology revealed a grade 2 lesion with LVSI present and she was staged at stage IA. She was recommended vaginal brachytherapy as adjuvant therapy to reduce risk for local recurrence.  Dr Lanell Persons delivered vaginal brachytherapy completed on 05/29/14. Her treatments were without complication. She has been using her vaginal dilator since.  PAIN:  Are you having pain? No  PRECAUTIONS: Other: bilateral LE lymphedema  WEIGHT BEARING RESTRICTIONS No  FALLS:  Has patient fallen in last 6 months? No  LIVING ENVIRONMENT: Lives with: lives alone Lives in: House/apartment Stairs: No;  Has following equipment at home: None  OCCUPATION: Retried  LEISURE: walks a couple of miles on Tuesday morning, on Thursday goes to piliates with [employer]   HAND DOMINANCE : right   PRIOR LEVEL OF FUNCTION: Independent  PATIENT GOALS to have a tool in my toolkit to relieve the swelling and redistribute the lymphatic fluid   OBJECTIVE  COGNITION:  Overall cognitive status: Within functional limits for tasks assessed   PALPATION: Pitting edema at bilateral ankles  OBSERVATIONS / OTHER ASSESSMENTS: LLE more swollen than RLE  POSTURE: forward head and rounded shoulders LYMPHEDEMA ASSESSMENTS:   SURGERY TYPE/DATE: uterine cancer with total hysterectomy in 2016, 1990 L mastectomy and ALND, 2008 R mastectomy  NUMBER OF LYMPH NODES REMOVED: none  CHEMOTHERAPY: completed for L breast cancer  RADIATION:completed  HORMONE TREATMENT: none  INFECTIONS: none  LYMPHEDEMA ASSESSMENTS:    LOWER EXTREMITY LANDMARK RIGHT eval  At groin   30 cm proximal to suprapatella   20 cm proximal to suprapatella 50.2  10 cm proximal to suprapatella 42  At midpatella / popliteal crease 38.6  30 cm proximal to floor at lateral plantar foot 38.6  20 cm proximal to floor at lateral plantar foot 30.4  10 cm proximal to floor at lateral plantar foot 24.5  Circumference of ankle/heel   5 cm proximal to 1st MTP  joint 20.5  Across MTP joint 21  Around proximal great toe 7.5  (Blank rows = not tested)  LOWER EXTREMITY LANDMARK LEFT eval  At groin   30 cm proximal to suprapatella   20 cm proximal to suprapatella 51.3  10 cm proximal to suprapatella 43.5  At midpatella / popliteal crease 39  30 cm proximal to floor at lateral plantar foot 39.5   20 cm proximal to floor at lateral plantar foot 32.5  10 cm proximal to floor at lateral plantar foot 26.4  Circumference of ankle/heel   5 cm proximal to 1st MTP joint 21  Across MTP joint 21.4  Around proximal great toe 7.6  (Blank rows = not tested)   LLIS:   22.06  TODAY'S TREATMENT  11/12/21: Pt did not have appropriate footwear to bandage today so will begin bandaging at next session  PATIENT EDUCATION:  Education details: educated pt in anatomy and physiology of the lymphatic system and basic principals of self MLD, compression pump, availability of compression stockings and  velcro compression garments Person educated: Patient Education method: Customer service manager Education comprehension: verbalized understanding   HOME EXERCISE PROGRAM: Walk with compression bandages intact once we begin bandaging at next appointment  ASSESSMENT:  CLINICAL IMPRESSION: Patient is a 81 y.o. female who was seen today for physical therapy evaluation and treatment for bilateral lymphedema. Pt underwent a complete hysterectomy and radiation therapy for treatment of uterine cancer in 2016. Pt reports her swelling began worsening 6 months ago. Pt plans to fly overseas to [place] in a month and would like to decrease swelling and have appropriately fitting compression garments prior to that time. She has compression stockings but has difficulty donning due to arthritis of her hands. She would benefit from skilled PT services to decrease bilateral LE lymphedema, instruct pt in self MLD and assist pt with obtaining appropriate compression garments.      OBJECTIVE IMPAIRMENTS decreased knowledge of condition, decreased knowledge of use of DME, and increased edema.   ACTIVITY LIMITATIONS  none  PARTICIPATION LIMITATIONS:  none  PERSONAL FACTORS  none  are also affecting patient's functional outcome.   REHAB POTENTIAL: Excellent  CLINICAL DECISION MAKING: Stable/uncomplicated  EVALUATION COMPLEXITY: Low  GOALS: Goals reviewed with patient? Yes  SHORT TERM GOALS=LONG TERM GOALS Target date: 12/24/21  Pt will obtain appropriate compression garments for long term management of lymphedema.  Baseline: Goal status: INITIAL  2.  Pt will be independent in self MLD for long term management of lymphedema.  Baseline:  Goal status: INITIAL  3.  Pt will receive a trial of the FlexiTouch compression pump for long term management of lymphedema.  Baseline:  Goal status: INITIAL  4.  Pt will demonstrate a 2 cm decrease of edema at 30 cm superior to medial malleoli on L LE to decrease risk of infection.  Baseline:  Goal status: INITIAL PLAN: PT FREQUENCY: 3x/week  PT DURATION: 6 weeks  PLANNED INTERVENTIONS: Therapeutic exercises, Patient/Family education, Self Care, Orthotic/Fit training, DME instructions, Manual lymph drainage, Compression bandaging, Vasopneumatic device, and Manual therapy  PLAN FOR NEXT SESSION: begin MLD to bilateral LE and instruct pt in self MLD technique, look at old compression garments and document what type she had that did not work and why, hear from [employer]?   [facility], PT 11/12/2021, 2:01 PM

## 2021-11-13 ENCOUNTER — Telehealth: Payer: Self-pay | Admitting: Podiatry

## 2021-11-13 ENCOUNTER — Ambulatory Visit: Payer: Medicare HMO

## 2021-11-13 DIAGNOSIS — Z8542 Personal history of malignant neoplasm of other parts of uterus: Secondary | ICD-10-CM | POA: Diagnosis not present

## 2021-11-13 DIAGNOSIS — I890 Lymphedema, not elsewhere classified: Secondary | ICD-10-CM | POA: Diagnosis not present

## 2021-11-13 NOTE — Telephone Encounter (Signed)
Patient called she is having swelling in lower legs and feet, PT has wrapped her legs and feet with compression bandages She just wanted to let you know. She will see you next Tuesday.

## 2021-11-13 NOTE — Therapy (Signed)
OUTPATIENT PHYSICAL THERAPY ONCOLOGY EVALUATION  Patient Name: Beth Simon MRN: [id number] DOB:December 26, 1940, 81 y.o., female Today's Date: 11/13/2021   PT End of Session - 11/13/21 0921     Visit Number 2    Number of Visits 13    Date for PT Re-Evaluation 12/24/21    PT Start Time 0908    PT Stop Time 1008    PT Time Calculation (min) 60 min    Activity Tolerance Patient tolerated treatment well    Behavior During Therapy [facility] for tasks assessed/performed             Past Medical History:  Diagnosis Date   Adenomatous polyps 01/2016   Anemia    Breast cancer ([place]) 1990   bilateral mastectomy, adenoca breast-left MRM, reconstruction, chemo   Bronchitis last 2 weeks   saw dr Felipa Eth 02-28-2014, he said no antibiotics needed, nonproductive cough   Complication of anesthesia    Cystitis    cytoxen, had once or twice   Family history of breast cancer    History of breast cancer    History of radiation therapy 2/10, 2/12, 2/18, 2/24, 05/29/14   vaginal cuff/ 30 Gy/ 5 fx   Hypertension    Macular degeneration    Neck pain    taking physictal therapy last 2 weeks   Osteoporosis    On Prolia.   PONV (postoperative nausea and vomiting)    Shoulder pain    taking physical therapy for last few weeks   Uterine cancer ([place]) 03/21/2014   MLH1/PMS2 LOH   Uterine fibroid    VAIN I (vaginal intraepithelial neoplasia grade I) 2018   positive HR HPV.   Vasovagal syncopes    Past Surgical History:  Procedure Laterality Date   BREAST IMPLANT REMOVAL  6/94   left breast   BREAST RECONSTRUCTION  1990   left   CATARACT EXTRACTION Bilateral    EXCISION VAGINAL CYST     MASTECTOMY Right 10/08/06   prophylactic   RADICAL MASTECTOMY LND  1990   left, chemo done   ROBOTIC ASSISTED TOTAL HYSTERECTOMY WITH BILATERAL SALPINGO OOPHERECTOMY Bilateral 03/21/2014   Procedure: ROBOTIC ASSISTED TOTAL HYSTERECTOMY WITH BILATERAL SALPINGO OOPHORECTOMY ;  Surgeon: Janie Morning, MD;   Location: [facility];  Service: Gynecology;  Laterality: Bilateral;   Patient Active Problem List   Diagnosis Date Noted   Age-related osteoporosis without current pathological fracture 03/19/2020   Personal history of colonic polyps 03/19/2020   Transient cerebral ischemia 09/15/2017   Pure hypercholesterolemia 09/15/2017   Pulmonary nodule 09/15/2017   Hypertension 09/15/2017   Gastro-esophageal reflux disease without esophagitis 09/15/2017   VAIN I (vaginal intraepithelial neoplasia grade I) 06/23/2017   Genetic testing 05/23/2014   History of breast cancer    Family history of breast cancer    Endometrial cancer ([place]) 03/21/2014   Uterine cancer ([place]) 03/21/2014   S/P mastectomy, bilateral 05/02/2013   Situational stress 05/02/2013   Osteoporosis, unspecified 05/02/2013   Breast cancer, left breast ([place]) 05/02/2013    PCP: Lajean Manes, MD  REFERRING PROVIDER: Linus Mako, MD   REFERRING DIAG: I89.0 (ICD-10-CM) - Lymphedema, not elsewhere classified   THERAPY DIAG:  Lymphedema, not elsewhere classified  History of uterine cancer  ONSET DATE: 05/01/21  Rationale for Evaluation and Treatment Rehabilitation  SUBJECTIVE  SUBJECTIVE STATEMENT: I'm ready to start compression bandaging to see if we can get my legs down before I fly to [place] in a month.    PERTINENT HISTORY:  History notable for left breast cancer ER-PR-  treated with radical mastectomy and chemotherapy in 1990.  Subsequently underwent right simple mastectomy 2008 without reconstruction. Denies use of tamoxifen   On 03/21/14  she underwent a robotic hysterectomy for treatment of uterine cancer, BSO. Frozen section revealed grade 1 cancer. Final pathology revealed a grade 2 lesion with LVSI present and she was staged at  stage IA. She was recommended vaginal brachytherapy as adjuvant therapy to reduce risk for local recurrence.  Dr Lanell Persons delivered vaginal brachytherapy completed on 05/29/14. Her treatments were without complication. She has been using her vaginal dilator since.  PAIN:  Are you having pain? No  PRECAUTIONS: Other: bilateral LE lymphedema  WEIGHT BEARING RESTRICTIONS No  FALLS:  Has patient fallen in last 6 months? No  LIVING ENVIRONMENT: Lives with: lives alone Lives in: House/apartment Stairs: No;  Has following equipment at home: None  OCCUPATION: Retried  LEISURE: walks a couple of miles on Tuesday morning, on Thursday goes to piliates with [employer]   HAND DOMINANCE : right   PRIOR LEVEL OF FUNCTION: Independent  PATIENT GOALS to have a tool in my toolkit to relieve the swelling and redistribute the lymphatic fluid   OBJECTIVE  COGNITION:  Overall cognitive status: Within functional limits for tasks assessed   PALPATION: Pitting edema at bilateral ankles  OBSERVATIONS / OTHER ASSESSMENTS: LLE more swollen than RLE  POSTURE: forward head and rounded shoulders LYMPHEDEMA ASSESSMENTS:   SURGERY TYPE/DATE: uterine cancer with total hysterectomy in 2016, 1990 L mastectomy and ALND, 2008 R mastectomy  NUMBER OF LYMPH NODES REMOVED: none  CHEMOTHERAPY: completed for L breast cancer  RADIATION:completed  HORMONE TREATMENT: none  INFECTIONS: none  LYMPHEDEMA ASSESSMENTS:   LOWER EXTREMITY LANDMARK RIGHT eval  At groin   30 cm proximal to suprapatella   20 cm proximal to suprapatella 50.2  10 cm proximal to suprapatella 42  At midpatella / popliteal crease 38.6  30 cm proximal to floor at lateral plantar foot 38.6  20 cm proximal to floor at lateral plantar foot 30.4  10 cm proximal to floor at lateral plantar foot 24.5  Circumference of ankle/heel   5 cm proximal to 1st MTP joint 20.5  Across MTP joint 21  Around proximal great toe 7.5  (Blank rows  = not tested)  LOWER EXTREMITY LANDMARK LEFT eval  At groin   30 cm proximal to suprapatella   20 cm proximal to suprapatella 51.3  10 cm proximal to suprapatella 43.5  At midpatella / popliteal crease 39  30 cm proximal to floor at lateral plantar foot 39.5   20 cm proximal to floor at lateral plantar foot 32.5  10 cm proximal to floor at lateral plantar foot 26.4  Circumference of ankle/heel   5 cm proximal to 1st MTP joint 21  Across MTP joint 21.4  Around proximal great toe 7.6  (Blank rows = not tested)   LLIS:   22.06  TODAY'S TREATMENT  11/13/21: Manual Therapy MLD: In Supine with HOB elevated and legs elevated on large bolster with pillows under knees: Short neck, 5 diaphragmatic breaths, Lt inguinal nodes, (no anastomosis due to Lt ALND in 1990); and then Lt LE working from proximal to distal, then retracing all steps (today did non intact sequence but will switch to intact  for remaining sessions for Lt LE) Compression Bandaging: Cocoa butter applied from foot to knees, thin stockinette to knees then bandages as follows: Elastomull to toes 1-4, Artiflex from foot to knee, 1-6 cm for combo Roman sandal and HAS, then 8-cm started HAS but only 1 pass of this and then continued bandage up to calf, then 1-12 cm short stretch compression bandage from ankle to knee. Donned pts velcro shoes after and she reports bandages comfortable by end of session. She was instructed in remedial exs to perform with compression bandages on.   11/12/21: Pt did not have appropriate footwear to bandage today so will begin bandaging at next session  PATIENT EDUCATION:  Education details: educated pt in anatomy and physiology of the lymphatic system and basic principals of self MLD, compression pump, availability of compression stockings and velcro compression garments Person educated: Patient Education method: Customer service manager Education comprehension: verbalized understanding   HOME  EXERCISE PROGRAM: Walk with compression bandages intact once we begin bandaging at next appointment  ASSESSMENT:  CLINICAL IMPRESSION: Assessed pts current compression garments. She has one pair that seemed good but she reports they are hard to get on and the brand of these is [employer] by [employer]. They also seems like they may not be correct fit. Began complete decongestive therapy of Lt LE today with compression bandaging of Rt as well. Explained to pt that as time allowed we would probably perform MLD alternating her legs. So next time MLD to Rt LE but we will be able to use Rt inguino-axillary anastomosis, unlike we were able to today due to Lt ALND. Pt was educated in basics of anatomy of lymphatic system and MLD principles. She was able to verbalize good basic understanding by end of session today. She will benefit from further instruction with having her return demo of MLD.    OBJECTIVE IMPAIRMENTS decreased knowledge of condition, decreased knowledge of use of DME, and increased edema.   ACTIVITY LIMITATIONS  none  PARTICIPATION LIMITATIONS:  none  PERSONAL FACTORS  none  are also affecting patient's functional outcome.   REHAB POTENTIAL: Excellent  CLINICAL DECISION MAKING: Stable/uncomplicated  EVALUATION COMPLEXITY: Low  GOALS: Goals reviewed with patient? Yes  SHORT TERM GOALS=LONG TERM GOALS Target date: 12/24/21  Pt will obtain appropriate compression garments for long term management of lymphedema.  Baseline: Goal status: INITIAL  2.  Pt will be independent in self MLD for long term management of lymphedema.  Baseline:  Goal status: INITIAL  3.  Pt will receive a trial of the FlexiTouch compression pump for long term management of lymphedema.  Baseline:  Goal status: INITIAL  4.  Pt will demonstrate a 2 cm decrease of edema at 30 cm superior to medial malleoli on L LE to decrease risk of infection.  Baseline:  Goal status: INITIAL PLAN: PT FREQUENCY: 3x/week  PT  DURATION: 6 weeks  PLANNED INTERVENTIONS: Therapeutic exercises, Patient/Family education, Self Care, Orthotic/Fit training, DME instructions, Manual lymph drainage, Compression bandaging, Vasopneumatic device, and Manual therapy  PLAN FOR NEXT SESSION: Cont CDT to bilateral LE's, how did pt tolerate bandaging? and instruct pt in self MLD technique and issue handout (do Rt LE next), hear from [place]?   Otelia Limes, PTA 11/13/2021, 1:02 PM

## 2021-11-14 DIAGNOSIS — H04123 Dry eye syndrome of bilateral lacrimal glands: Secondary | ICD-10-CM | POA: Diagnosis not present

## 2021-11-14 DIAGNOSIS — Z961 Presence of intraocular lens: Secondary | ICD-10-CM | POA: Diagnosis not present

## 2021-11-15 ENCOUNTER — Ambulatory Visit: Payer: Medicare HMO

## 2021-11-15 ENCOUNTER — Ambulatory Visit: Payer: Medicare HMO | Admitting: Podiatry

## 2021-11-15 DIAGNOSIS — Z8542 Personal history of malignant neoplasm of other parts of uterus: Secondary | ICD-10-CM

## 2021-11-15 DIAGNOSIS — I890 Lymphedema, not elsewhere classified: Secondary | ICD-10-CM

## 2021-11-15 NOTE — Therapy (Signed)
OUTPATIENT PHYSICAL THERAPY ONCOLOGY EVALUATION  Patient Name: Beth Simon MRN: [id number] DOB:January 27, 1941, 81 y.o., female Today's Date: 11/15/2021   PT End of Session - 11/15/21 1000     Visit Number 3    Number of Visits 13    Date for PT Re-Evaluation 12/24/21    PT Start Time 1003    PT Stop Time 1110    PT Time Calculation (min) 67 min    Activity Tolerance Patient tolerated treatment well    Behavior During Therapy [facility] for tasks assessed/performed             Past Medical History:  Diagnosis Date   Adenomatous polyps 01/2016   Anemia    Breast cancer ([place]) 1990   bilateral mastectomy, adenoca breast-left MRM, reconstruction, chemo   Bronchitis last 2 weeks   saw dr Felipa Eth 02-28-2014, he said no antibiotics needed, nonproductive cough   Complication of anesthesia    Cystitis    cytoxen, had once or twice   Family history of breast cancer    History of breast cancer    History of radiation therapy 2/10, 2/12, 2/18, 2/24, 05/29/14   vaginal cuff/ 30 Gy/ 5 fx   Hypertension    Macular degeneration    Neck pain    taking physictal therapy last 2 weeks   Osteoporosis    On Prolia.   PONV (postoperative nausea and vomiting)    Shoulder pain    taking physical therapy for last few weeks   Uterine cancer ([place]) 03/21/2014   MLH1/PMS2 LOH   Uterine fibroid    VAIN I (vaginal intraepithelial neoplasia grade I) 2018   positive HR HPV.   Vasovagal syncopes    Past Surgical History:  Procedure Laterality Date   BREAST IMPLANT REMOVAL  6/94   left breast   BREAST RECONSTRUCTION  1990   left   CATARACT EXTRACTION Bilateral    EXCISION VAGINAL CYST     MASTECTOMY Right 10/08/06   prophylactic   RADICAL MASTECTOMY LND  1990   left, chemo done   ROBOTIC ASSISTED TOTAL HYSTERECTOMY WITH BILATERAL SALPINGO OOPHERECTOMY Bilateral 03/21/2014   Procedure: ROBOTIC ASSISTED TOTAL HYSTERECTOMY WITH BILATERAL SALPINGO OOPHORECTOMY ;  Surgeon: Janie Morning, MD;   Location: [facility];  Service: Gynecology;  Laterality: Bilateral;   Patient Active Problem List   Diagnosis Date Noted   Age-related osteoporosis without current pathological fracture 03/19/2020   Personal history of colonic polyps 03/19/2020   Transient cerebral ischemia 09/15/2017   Pure hypercholesterolemia 09/15/2017   Pulmonary nodule 09/15/2017   Hypertension 09/15/2017   Gastro-esophageal reflux disease without esophagitis 09/15/2017   VAIN I (vaginal intraepithelial neoplasia grade I) 06/23/2017   Genetic testing 05/23/2014   History of breast cancer    Family history of breast cancer    Endometrial cancer ([place]) 03/21/2014   Uterine cancer ([place]) 03/21/2014   S/P mastectomy, bilateral 05/02/2013   Situational stress 05/02/2013   Osteoporosis, unspecified 05/02/2013   Breast cancer, left breast ([place]) 05/02/2013    PCP: Lajean Manes, MD  REFERRING PROVIDER: Linus Mako, MD   REFERRING DIAG: I89.0 (ICD-10-CM) - Lymphedema, not elsewhere classified   THERAPY DIAG:  Lymphedema, not elsewhere classified  History of uterine cancer  ONSET DATE: 05/01/21  Rationale for Evaluation and Treatment Rehabilitation  SUBJECTIVE  SUBJECTIVE STATEMENT: My ankles were a bit sore yesterday. Its been exhausting wearing these shoes and they are bothering my 4th and 5th toes and the shoes slide off because they don't have a back. I have to see the foot Dr on Tuesday and I come here Monday and Wednesday. I will have to remove the wraps on Tuesday and wash them that night before coming in on Wednesday.  PERTINENT HISTORY:  History notable for left breast cancer ER-PR-  treated with radical mastectomy and chemotherapy in 1990.  Subsequently underwent right simple mastectomy 2008 without reconstruction.  Denies use of tamoxifen   On 03/21/14  she underwent a robotic hysterectomy for treatment of uterine cancer, BSO. Frozen section revealed grade 1 cancer. Final pathology revealed a grade 2 lesion with LVSI present and she was staged at stage IA. She was recommended vaginal brachytherapy as adjuvant therapy to reduce risk for local recurrence.  Dr Lanell Persons delivered vaginal brachytherapy completed on 05/29/14. Her treatments were without complication. She has been using her vaginal dilator since.  PAIN:  Are you having pain? No  PRECAUTIONS: Other: bilateral LE lymphedema  WEIGHT BEARING RESTRICTIONS No  FALLS:  Has patient fallen in last 6 months? No  LIVING ENVIRONMENT: Lives with: lives alone Lives in: House/apartment Stairs: No;  Has following equipment at home: None  OCCUPATION: Retried  LEISURE: walks a couple of miles on Tuesday morning, on Thursday goes to piliates with [employer]   HAND DOMINANCE : right   PRIOR LEVEL OF FUNCTION: Independent  PATIENT GOALS to have a tool in my toolkit to relieve the swelling and redistribute the lymphatic fluid   OBJECTIVE  COGNITION:  Overall cognitive status: Within functional limits for tasks assessed   PALPATION: Pitting edema at bilateral ankles  OBSERVATIONS / OTHER ASSESSMENTS: LLE more swollen than RLE  POSTURE: forward head and rounded shoulders LYMPHEDEMA ASSESSMENTS:   SURGERY TYPE/DATE: uterine cancer with total hysterectomy in 2016, 1990 L mastectomy and ALND, 2008 R mastectomy  NUMBER OF LYMPH NODES REMOVED: none  CHEMOTHERAPY: completed for L breast cancer  RADIATION:completed  HORMONE TREATMENT: none  INFECTIONS: none  LYMPHEDEMA ASSESSMENTS:   LOWER EXTREMITY LANDMARK RIGHT eval  At groin   30 cm proximal to suprapatella   20 cm proximal to suprapatella 50.2  10 cm proximal to suprapatella 42  At midpatella / popliteal crease 38.6  30 cm proximal to floor at lateral plantar foot 38.6  20 cm  proximal to floor at lateral plantar foot 30.4  10 cm proximal to floor at lateral plantar foot 24.5  Circumference of ankle/heel   5 cm proximal to 1st MTP joint 20.5  Across MTP joint 21  Around proximal great toe 7.5  (Blank rows = not tested)  LOWER EXTREMITY LANDMARK LEFT eval  At groin   30 cm proximal to suprapatella   20 cm proximal to suprapatella 51.3  10 cm proximal to suprapatella 43.5  At midpatella / popliteal crease 39  30 cm proximal to floor at lateral plantar foot 39.5   20 cm proximal to floor at lateral plantar foot 32.5  10 cm proximal to floor at lateral plantar foot 26.4  Circumference of ankle/heel   5 cm proximal to 1st MTP joint 21  Across MTP joint 21.4  Around proximal great toe 7.6  (Blank rows = not tested)   LLIS:   22.06  TODAY'S TREATMENT   11/15/2021 Pt came in wrapped.  Wraps removed MLD: In Supine with HOB elevated  and legs elevated on large bolster with pillows under knees: Short neck, 5 diaphragmatic breaths, Lt inguinal nodes, (no anastomosis due to Lt ALND in 1990); and then Lt LE working from proximal to distal, then retracing all steps (with intact sequence) but will continue intact for remaining sessions for Lt LE) Compression Bandaging: Cocoa butter applied from foot to knees, thin stockinette to knees then bandages as follows: Elastomull to toes 1-5, Artiflex from foot to knee, 1-6 cm with Figure 8, then 8-cm with heel locks  then continued to mid leg, then 1-12 cm short stretch compression bandage from ankle to knee. Donned pts velcro shoes after and she reports bandages comfortable by end of session. She was reminded remedial exs to perform with compression bandages on.   11/13/21: Manual Therapy MLD: In Supine with HOB elevated and legs elevated on large bolster with pillows under knees: Short neck, 5 diaphragmatic breaths, Lt inguinal nodes, (no anastomosis due to Lt ALND in 1990); and then [place] working from proximal to distal, then  retracing all steps (today did non intact sequence but will switch to intact for remaining sessions for Lt LE) Compression Bandaging: Cocoa butter applied from foot to knees, thin stockinette to knees then bandages as follows: Elastomull to toes 1-4, Artiflex from foot to knee, 1-6 cm for combo Roman sandal and HAS, then 8-cm started HAS but only 1 pass of this and then continued bandage up to calf, then 1-12 cm short stretch compression bandage from ankle to knee. Donned pts velcro shoes after and she reports bandages comfortable by end of session. She was instructed in remedial exs to perform with compression bandages on.  Wrapped all toes at pt request. Gave pt some 1/4 in foam to pad small toes in shoe and some velcro to add to shoe. Also discussed a Teva style sandal with a back for more comfort with walking  11/12/21: Pt did not have appropriate footwear to bandage today so will begin bandaging at next session  PATIENT EDUCATION:  Education details: educated pt in anatomy and physiology of the lymphatic system and basic principals of self MLD, compression pump, availability of compression stockings and velcro compression garments Person educated: Patient Education method: Customer service manager Education comprehension: verbalized understanding   HOME EXERCISE PROGRAM: Walk with compression bandages intact once we begin bandaging at next appointment  ASSESSMENT:  CLINICAL IMPRESSION:   Continued complete decongestive therapy of Lt LE today with compression bandaging of Rt as well at pt request as she felt left leg is her more problem leg and was more swollen.Explained to pt that as time allowed we would probably perform MLD alternating her legs. So next time MLD to Rt LE but we will be able to use Rt inguino-axillary anastomosis, which we were not able to today due to Lt ALND. Pt had excellent edema reduction and may not require bandaging long. Expressed concern to pt about the  discomfort she was having in her lateral 2 toes bilaterally because her shoes are too tight. Advised to remove shoes in home if possible and gave some foam to try and pad the shoe. She is to remove wraps if having too much discomfort.    OBJECTIVE IMPAIRMENTS decreased knowledge of condition, decreased knowledge of use of DME, and increased edema.   ACTIVITY LIMITATIONS  none  PARTICIPATION LIMITATIONS:  none  PERSONAL FACTORS  none  are also affecting patient's functional outcome.   REHAB POTENTIAL: Excellent  CLINICAL DECISION MAKING: Stable/uncomplicated  EVALUATION  COMPLEXITY: Low  GOALS: Goals reviewed with patient? Yes  SHORT TERM GOALS=LONG TERM GOALS Target date: 12/24/21  Pt will obtain appropriate compression garments for long term management of lymphedema.  Baseline: Goal status: INITIAL  2.  Pt will be independent in self MLD for long term management of lymphedema.  Baseline:  Goal status: INITIAL  3.  Pt will receive a trial of the FlexiTouch compression pump for long term management of lymphedema.  Baseline:  Goal status: INITIAL  4.  Pt will demonstrate a 2 cm decrease of edema at 30 cm superior to medial malleoli on L LE to decrease risk of infection.  Baseline:  Goal status: INITIAL PLAN: PT FREQUENCY: 3x/week  PT DURATION: 6 weeks  PLANNED INTERVENTIONS: Therapeutic exercises, Patient/Family education, Self Care, Orthotic/Fit training, DME instructions, Manual lymph drainage, Compression bandaging, Vasopneumatic device, and Manual therapy  PLAN FOR NEXT SESSION: Cont CDT to bilateral LE's, how did pt tolerate bandaging? and instruct pt in self MLD technique and issue handout (do Rt LE next), hear from [place]? (On Left side to Inguinal only due to Left ALND)   Claris Pong, PT 11/15/2021, 12:44 PM

## 2021-11-18 ENCOUNTER — Ambulatory Visit: Payer: Medicare HMO | Admitting: Physical Therapy

## 2021-11-18 ENCOUNTER — Encounter: Payer: Self-pay | Admitting: Physical Therapy

## 2021-11-18 DIAGNOSIS — Z8542 Personal history of malignant neoplasm of other parts of uterus: Secondary | ICD-10-CM | POA: Diagnosis not present

## 2021-11-18 DIAGNOSIS — I890 Lymphedema, not elsewhere classified: Secondary | ICD-10-CM

## 2021-11-18 NOTE — Therapy (Signed)
OUTPATIENT PHYSICAL THERAPY ONCOLOGY EVALUATION  Patient Name: Beth Simon MRN: [id number] DOB:September 05, 1940, 81 y.o., female Today's Date: 11/18/2021   PT End of Session - 11/18/21 0909     Visit Number 4    Number of Visits 13    Date for PT Re-Evaluation 12/24/21    PT Start Time 0907    PT Stop Time 1005    PT Time Calculation (min) 58 min    Activity Tolerance Patient tolerated treatment well    Behavior During Therapy [facility] for tasks assessed/performed             Past Medical History:  Diagnosis Date   Adenomatous polyps 01/2016   Anemia    Breast cancer ([place]) 1990   bilateral mastectomy, adenoca breast-left MRM, reconstruction, chemo   Bronchitis last 2 weeks   saw dr Felipa Eth 02-28-2014, he said no antibiotics needed, nonproductive cough   Complication of anesthesia    Cystitis    cytoxen, had once or twice   Family history of breast cancer    History of breast cancer    History of radiation therapy 2/10, 2/12, 2/18, 2/24, 05/29/14   vaginal cuff/ 30 Gy/ 5 fx   Hypertension    Macular degeneration    Neck pain    taking physictal therapy last 2 weeks   Osteoporosis    On Prolia.   PONV (postoperative nausea and vomiting)    Shoulder pain    taking physical therapy for last few weeks   Uterine cancer ([place]) 03/21/2014   MLH1/PMS2 LOH   Uterine fibroid    VAIN I (vaginal intraepithelial neoplasia grade I) 2018   positive HR HPV.   Vasovagal syncopes    Past Surgical History:  Procedure Laterality Date   BREAST IMPLANT REMOVAL  6/94   left breast   BREAST RECONSTRUCTION  1990   left   CATARACT EXTRACTION Bilateral    EXCISION VAGINAL CYST     MASTECTOMY Right 10/08/06   prophylactic   RADICAL MASTECTOMY LND  1990   left, chemo done   ROBOTIC ASSISTED TOTAL HYSTERECTOMY WITH BILATERAL SALPINGO OOPHERECTOMY Bilateral 03/21/2014   Procedure: ROBOTIC ASSISTED TOTAL HYSTERECTOMY WITH BILATERAL SALPINGO OOPHORECTOMY ;  Surgeon: Janie Morning, MD;   Location: [facility];  Service: Gynecology;  Laterality: Bilateral;   Patient Active Problem List   Diagnosis Date Noted   Age-related osteoporosis without current pathological fracture 03/19/2020   Personal history of colonic polyps 03/19/2020   Transient cerebral ischemia 09/15/2017   Pure hypercholesterolemia 09/15/2017   Pulmonary nodule 09/15/2017   Hypertension 09/15/2017   Gastro-esophageal reflux disease without esophagitis 09/15/2017   VAIN I (vaginal intraepithelial neoplasia grade I) 06/23/2017   Genetic testing 05/23/2014   History of breast cancer    Family history of breast cancer    Endometrial cancer ([place]) 03/21/2014   Uterine cancer ([place]) 03/21/2014   S/P mastectomy, bilateral 05/02/2013   Situational stress 05/02/2013   Osteoporosis, unspecified 05/02/2013   Breast cancer, left breast ([place]) 05/02/2013    PCP: Lajean Manes, MD  REFERRING PROVIDER: Linus Mako, MD   REFERRING DIAG: I89.0 (ICD-10-CM) - Lymphedema, not elsewhere classified   THERAPY DIAG:  Lymphedema, not elsewhere classified  History of uterine cancer  ONSET DATE: 05/01/21  Rationale for Evaluation and Treatment Rehabilitation  SUBJECTIVE  SUBJECTIVE STATEMENT: I found these covers to put over my toes to keep the shoes but being so uncomfortable.   PERTINENT HISTORY:  History notable for left breast cancer ER-PR-  treated with radical mastectomy and chemotherapy in 1990.  Subsequently underwent right simple mastectomy 2008 without reconstruction. Denies use of tamoxifen   On 03/21/14  she underwent a robotic hysterectomy for treatment of uterine cancer, BSO. Frozen section revealed grade 1 cancer. Final pathology revealed a grade 2 lesion with LVSI present and she was staged at stage IA. She was recommended  vaginal brachytherapy as adjuvant therapy to reduce risk for local recurrence.  Dr Lanell Persons delivered vaginal brachytherapy completed on 05/29/14. Her treatments were without complication. She has been using her vaginal dilator since.  PAIN:  Are you having pain? No  PRECAUTIONS: Other: bilateral LE lymphedema  WEIGHT BEARING RESTRICTIONS No  FALLS:  Has patient fallen in last 6 months? No  LIVING ENVIRONMENT: Lives with: lives alone Lives in: House/apartment Stairs: No;  Has following equipment at home: None  OCCUPATION: Retried  LEISURE: walks a couple of miles on Tuesday morning, on Thursday goes to piliates with [employer]   HAND DOMINANCE : right   PRIOR LEVEL OF FUNCTION: Independent  PATIENT GOALS to have a tool in my toolkit to relieve the swelling and redistribute the lymphatic fluid   OBJECTIVE  COGNITION:  Overall cognitive status: Within functional limits for tasks assessed   PALPATION: Pitting edema at bilateral ankles  OBSERVATIONS / OTHER ASSESSMENTS: LLE more swollen than RLE  POSTURE: forward head and rounded shoulders LYMPHEDEMA ASSESSMENTS:   SURGERY TYPE/DATE: uterine cancer with total hysterectomy in 2016, 1990 L mastectomy and ALND, 2008 R mastectomy  NUMBER OF LYMPH NODES REMOVED: none  CHEMOTHERAPY: completed for L breast cancer  RADIATION:completed  HORMONE TREATMENT: none  INFECTIONS: none  LYMPHEDEMA ASSESSMENTS:   LOWER EXTREMITY LANDMARK RIGHT eval RIGHT 11/18/21  At groin    30 cm proximal to suprapatella    20 cm proximal to suprapatella 50.2 49  10 cm proximal to suprapatella 42 41.2  At midpatella / popliteal crease 38.6 38  30 cm proximal to floor at lateral plantar foot 38.6 37.7  20 cm proximal to floor at lateral plantar foot 30.4 30.1  10 cm proximal to floor at lateral plantar foot 24.5 23  Circumference of ankle/heel    5 cm proximal to 1st MTP joint 20.5 20  Across MTP joint 21 20.7  Around proximal great  toe 7.5 7.5  (Blank rows = not tested)  LOWER EXTREMITY LANDMARK LEFT eval LEFT 11/18/21  At groin    30 cm proximal to suprapatella    20 cm proximal to suprapatella 51.3 51  10 cm proximal to suprapatella 43.5 42.5  At midpatella / popliteal crease 39 38.5  30 cm proximal to floor at lateral plantar foot 39.5  38.5  20 cm proximal to floor at lateral plantar foot 32.5 30.5  10 cm proximal to floor at lateral plantar foot 26.4 23.2  Circumference of ankle/heel    5 cm proximal to 1st MTP joint 21 20.7  Across MTP joint 21.4 20.9  Around proximal great toe 7.6 7.4  (Blank rows = not tested)   LLIS:   22.06  TODAY'S TREATMENT   11/18/21: Removed bandages and washed legs with soap and water. Pt brought in old compression garments and therapist assessed for fit. They were a size too large. Pt reports inability to don them. Remeasured circumferences and  bilateral LE circumferences have decrease. Had pt practice donning garments with wire donner and with slippy grip by Juzo and pt unable to don with either due to soreness and pain in her hand joints. Discussed compression socks with pt - not optimal for edema but if she is able to don might be a good option.  Compression Bandaging: Cocoa butter applied from foot to knees, thin stockinette to knees then bandages as follows: Elastomull to toes 1-5, Artiflex from foot to knee, 1-6 cm with Figure 8, then 8-cm with heel locks  then continued to mid leg, then 1-12 cm short stretch compression bandage from ankle to knee. Donned pts velcro shoes after and she reports bandages comfortable by end of session.   11/15/2021 Pt came in wrapped.  Wraps removed MLD: In Supine with HOB elevated and legs elevated on large bolster with pillows under knees: Short neck, 5 diaphragmatic breaths, Lt inguinal nodes, (no anastomosis due to Lt ALND in 1990); and then Lt LE working from proximal to distal, then retracing all steps (with intact sequence) but will  continue intact for remaining sessions for Lt LE) Compression Bandaging: Cocoa butter applied from foot to knees, thin stockinette to knees then bandages as follows: Elastomull to toes 1-5, Artiflex from foot to knee, 1-6 cm with Figure 8, then 8-cm with heel locks  then continued to mid leg, then 1-12 cm short stretch compression bandage from ankle to knee. Donned pts velcro shoes after and she reports bandages comfortable by end of session. She was reminded remedial exs to perform with compression bandages on.   11/13/21: Manual Therapy MLD: In Supine with HOB elevated and legs elevated on large bolster with pillows under knees: Short neck, 5 diaphragmatic breaths, Lt inguinal nodes, (no anastomosis due to Lt ALND in 1990); and then [place] working from proximal to distal, then retracing all steps (today did non intact sequence but will switch to intact for remaining sessions for Lt LE) Compression Bandaging: Cocoa butter applied from foot to knees, thin stockinette to knees then bandages as follows: Elastomull to toes 1-4, Artiflex from foot to knee, 1-6 cm for combo Roman sandal and HAS, then 8-cm started HAS but only 1 pass of this and then continued bandage up to calf, then 1-12 cm short stretch compression bandage from ankle to knee. Donned pts velcro shoes after and she reports bandages comfortable by end of session. She was instructed in remedial exs to perform with compression bandages on.  Wrapped all toes at pt request. Gave pt some 1/4 in foam to pad small toes in shoe and some velcro to add to shoe. Also discussed a Teva style sandal with a back for more comfort with walking  11/12/21: Pt did not have appropriate footwear to bandage today so will begin bandaging at next session  PATIENT EDUCATION:  Education details: educated pt in anatomy and physiology of the lymphatic system and basic principals of self MLD, compression pump, availability of compression stockings and velcro compression  garments Person educated: Patient Education method: Customer service manager Education comprehension: verbalized understanding   HOME EXERCISE PROGRAM: Walk with compression bandages intact once we begin bandaging at next appointment  ASSESSMENT:  CLINICAL IMPRESSION: Remeasured bilateral LE circumferences and all have decreased since eval. Spent session today trying to troubleshoot pt's inability to don 20-30 circular knit compression garments. Had pt try different donning aids but pt was unable to don her garments even with donning aids due to pain from arthritis in  her fingers. Pt would benefit from compression socks. Will try a 20-30 initially and if she is still unable to don will try the 15-20 though pt really needs higher compression. Reapplied compression bandages. Will assist pt with obtaining compression socks at next session.     OBJECTIVE IMPAIRMENTS decreased knowledge of condition, decreased knowledge of use of DME, and increased edema.   ACTIVITY LIMITATIONS  none  PARTICIPATION LIMITATIONS:  none  PERSONAL FACTORS  none  are also affecting patient's functional outcome.   REHAB POTENTIAL: Excellent  CLINICAL DECISION MAKING: Stable/uncomplicated  EVALUATION COMPLEXITY: Low  GOALS: Goals reviewed with patient? Yes  SHORT TERM GOALS=LONG TERM GOALS Target date: 12/24/21  Pt will obtain appropriate compression garments for long term management of lymphedema.  Baseline: Goal status: INITIAL  2.  Pt will be independent in self MLD for long term management of lymphedema.  Baseline:  Goal status: INITIAL  3.  Pt will receive a trial of the FlexiTouch compression pump for long term management of lymphedema.  Baseline:  Goal status: INITIAL  4.  Pt will demonstrate a 2 cm decrease of edema at 30 cm superior to medial malleoli on L LE to decrease risk of infection.  Baseline:  Goal status: INITIAL PLAN: PT FREQUENCY: 3x/week  PT DURATION: 6  weeks  PLANNED INTERVENTIONS: Therapeutic exercises, Patient/Family education, Self Care, Orthotic/Fit training, DME instructions, Manual lymph drainage, Compression bandaging, Vasopneumatic device, and Manual therapy  PLAN FOR NEXT SESSION: educate pt that we received the benefits back from tactile and the flexi touch will be covered, educate pt about benefits of the pump, measure pt for Juzo compression sock 20-22mmHg - power comfort compression sock, Cont CDT to bilateral LE's, how did pt tolerate bandaging? and instruct pt in self MLD technique and issue handout (do Rt LE next),  (On Left side to Inguinal only due to Left ALND)   Manus Gunning, PT 11/18/2021, 12:00 PM

## 2021-11-19 ENCOUNTER — Ambulatory Visit: Payer: Medicare HMO | Admitting: Podiatry

## 2021-11-19 DIAGNOSIS — M79676 Pain in unspecified toe(s): Secondary | ICD-10-CM | POA: Diagnosis not present

## 2021-11-19 DIAGNOSIS — Q828 Other specified congenital malformations of skin: Secondary | ICD-10-CM | POA: Diagnosis not present

## 2021-11-19 DIAGNOSIS — B351 Tinea unguium: Secondary | ICD-10-CM

## 2021-11-20 ENCOUNTER — Ambulatory Visit: Payer: Medicare HMO

## 2021-11-20 DIAGNOSIS — I890 Lymphedema, not elsewhere classified: Secondary | ICD-10-CM | POA: Diagnosis not present

## 2021-11-20 DIAGNOSIS — Z8542 Personal history of malignant neoplasm of other parts of uterus: Secondary | ICD-10-CM

## 2021-11-20 NOTE — Therapy (Signed)
OUTPATIENT PHYSICAL THERAPY ONCOLOGY EVALUATION  Patient Name: Beth Simon MRN: [id number] DOB:Oct 11, 1940, 81 y.o., female Today's Date: 11/20/2021   PT End of Session - 11/20/21 0903     Visit Number 5    Number of Visits 13    Date for PT Re-Evaluation 12/24/21    PT Start Time 0905    PT Stop Time 0957    PT Time Calculation (min) 52 min    Activity Tolerance Patient tolerated treatment well    Behavior During Therapy [facility] for tasks assessed/performed             Past Medical History:  Diagnosis Date   Adenomatous polyps 01/2016   Anemia    Breast cancer ([place]) 1990   bilateral mastectomy, adenoca breast-left MRM, reconstruction, chemo   Bronchitis last 2 weeks   saw dr Felipa Eth 02-28-2014, he said no antibiotics needed, nonproductive cough   Complication of anesthesia    Cystitis    cytoxen, had once or twice   Family history of breast cancer    History of breast cancer    History of radiation therapy 2/10, 2/12, 2/18, 2/24, 05/29/14   vaginal cuff/ 30 Gy/ 5 fx   Hypertension    Macular degeneration    Neck pain    taking physictal therapy last 2 weeks   Osteoporosis    On Prolia.   PONV (postoperative nausea and vomiting)    Shoulder pain    taking physical therapy for last few weeks   Uterine cancer ([place]) 03/21/2014   MLH1/PMS2 LOH   Uterine fibroid    VAIN I (vaginal intraepithelial neoplasia grade I) 2018   positive HR HPV.   Vasovagal syncopes    Past Surgical History:  Procedure Laterality Date   BREAST IMPLANT REMOVAL  6/94   left breast   BREAST RECONSTRUCTION  1990   left   CATARACT EXTRACTION Bilateral    EXCISION VAGINAL CYST     MASTECTOMY Right 10/08/06   prophylactic   RADICAL MASTECTOMY LND  1990   left, chemo done   ROBOTIC ASSISTED TOTAL HYSTERECTOMY WITH BILATERAL SALPINGO OOPHERECTOMY Bilateral 03/21/2014   Procedure: ROBOTIC ASSISTED TOTAL HYSTERECTOMY WITH BILATERAL SALPINGO OOPHORECTOMY ;  Surgeon: Janie Morning, MD;   Location: [facility];  Service: Gynecology;  Laterality: Bilateral;   Patient Active Problem List   Diagnosis Date Noted   Age-related osteoporosis without current pathological fracture 03/19/2020   Personal history of colonic polyps 03/19/2020   Transient cerebral ischemia 09/15/2017   Pure hypercholesterolemia 09/15/2017   Pulmonary nodule 09/15/2017   Hypertension 09/15/2017   Gastro-esophageal reflux disease without esophagitis 09/15/2017   VAIN I (vaginal intraepithelial neoplasia grade I) 06/23/2017   Genetic testing 05/23/2014   History of breast cancer    Family history of breast cancer    Endometrial cancer ([place]) 03/21/2014   Uterine cancer ([place]) 03/21/2014   S/P mastectomy, bilateral 05/02/2013   Situational stress 05/02/2013   Osteoporosis, unspecified 05/02/2013   Breast cancer, left breast ([place]) 05/02/2013    PCP: Lajean Manes, MD  REFERRING PROVIDER: Linus Mako, MD   REFERRING DIAG: I89.0 (ICD-10-CM) - Lymphedema, not elsewhere classified   THERAPY DIAG:  Lymphedema, not elsewhere classified  History of uterine cancer  ONSET DATE: 05/01/21  Rationale for Evaluation and Treatment Rehabilitation  SUBJECTIVE  SUBJECTIVE STATEMENT: I walked in the park yesterday with my wraps on.  I took the wraps off and both legs were itchy and red with little bumps. It still a little red and itchy but I put hydrocortisone on and that helps.   PERTINENT HISTORY:  History notable for left breast cancer ER-PR-  treated with radical mastectomy and chemotherapy in 1990.  Subsequently underwent right simple mastectomy 2008 without reconstruction. Denies use of tamoxifen   On 03/21/14  she underwent a robotic hysterectomy for treatment of uterine cancer, BSO. Frozen section revealed grade 1  cancer. Final pathology revealed a grade 2 lesion with LVSI present and she was staged at stage IA. She was recommended vaginal brachytherapy as adjuvant therapy to reduce risk for local recurrence.  Dr Lanell Persons delivered vaginal brachytherapy completed on 05/29/14. Her treatments were without complication. She has been using her vaginal dilator since.  PAIN:  Are you having pain? No  PRECAUTIONS: Other: bilateral LE lymphedema  WEIGHT BEARING RESTRICTIONS No  FALLS:  Has patient fallen in last 6 months? No  LIVING ENVIRONMENT: Lives with: lives alone Lives in: House/apartment Stairs: No;  Has following equipment at home: None  OCCUPATION: Retried  LEISURE: walks a couple of miles on Tuesday morning, on Thursday goes to piliates with [employer]   HAND DOMINANCE : right   PRIOR LEVEL OF FUNCTION: Independent  PATIENT GOALS to have a tool in my toolkit to relieve the swelling and redistribute the lymphatic fluid   OBJECTIVE  COGNITION:  Overall cognitive status: Within functional limits for tasks assessed   PALPATION: Pitting edema at bilateral ankles  OBSERVATIONS / OTHER ASSESSMENTS: LLE more swollen than RLE  POSTURE: forward head and rounded shoulders LYMPHEDEMA ASSESSMENTS:   SURGERY TYPE/DATE: uterine cancer with total hysterectomy in 2016, 1990 L mastectomy and ALND, 2008 R mastectomy  NUMBER OF LYMPH NODES REMOVED: none  CHEMOTHERAPY: completed for L breast cancer  RADIATION:completed  HORMONE TREATMENT: none  INFECTIONS: none  LYMPHEDEMA ASSESSMENTS:   LOWER EXTREMITY LANDMARK RIGHT eval RIGHT 11/18/21  At groin    30 cm proximal to suprapatella    20 cm proximal to suprapatella 50.2 49  10 cm proximal to suprapatella 42 41.2  At midpatella / popliteal crease 38.6 38  30 cm proximal to floor at lateral plantar foot 38.6 37.7  20 cm proximal to floor at lateral plantar foot 30.4 30.1  10 cm proximal to floor at lateral plantar foot 24.5 23   Circumference of ankle/heel    5 cm proximal to 1st MTP joint 20.5 20  Across MTP joint 21 20.7  Around proximal great toe 7.5 7.5  (Blank rows = not tested)  LOWER EXTREMITY LANDMARK LEFT eval LEFT 11/18/21  At groin    30 cm proximal to suprapatella    20 cm proximal to suprapatella 51.3 51  10 cm proximal to suprapatella 43.5 42.5  At midpatella / popliteal crease 39 38.5  30 cm proximal to floor at lateral plantar foot 39.5  38.5  20 cm proximal to floor at lateral plantar foot 32.5 30.5  10 cm proximal to floor at lateral plantar foot 26.4 23.2  Circumference of ankle/heel    5 cm proximal to 1st MTP joint 21 20.7  Across MTP joint 21.4 20.9  Around proximal great toe 7.6 7.4  (Blank rows = not tested)   LLIS:   22.06  TODAY'S TREATMENT  11/20/2021 Pt measured for [employer] comfort Socks and fit into size medium.  Ordered 15-20 and 20-30 compression and she will return one. Pt applied hydrocortisone and therapist cut size small TG soft for both legs to use instead of stockinette. Compression Bandaging: Pt applied hydrocortisone cream bilaterally,TG soft foot to knee, to knees then bandages as follows: Elastomull to toes 1-5, Artiflex from foot to knee, 1-6 cm with Figure 8, then 8-cm with heel locks  then continued to mid leg, then 1-12 cm short stretch compression bandage from ankle to knee. Donned pts velcro shoes after and she reports bandages comfortable by end of session.  Pt to remove wraps if itching is too bad, but had no itching when leaving    11/18/21: Removed bandages and washed legs with soap and water. Pt brought in old compression garments and therapist assessed for fit. They were a size too large. Pt reports inability to don them. Remeasured circumferences and bilateral LE circumferences have decrease. Had pt practice donning garments with wire donner and with slippy grip by [employer] and pt unable to don with either due to soreness and pain in her hand joints.  Discussed compression socks with pt - not optimal for edema but if she is able to don might be a good option.  Compression Bandaging: Cocoa butter applied from foot to knees, thin stockinette to knees then bandages as follows: Elastomull to toes 1-5, Artiflex from foot to knee, 1-6 cm with Figure 8, then 8-cm with heel locks  then continued to mid leg, then 1-12 cm short stretch compression bandage from ankle to knee. Donned pts velcro shoes after and she reports bandages comfortable by end of session.   11/15/2021 Pt came in wrapped.  Wraps removed MLD: In Supine with HOB elevated and legs elevated on large bolster with pillows under knees: Short neck, 5 diaphragmatic breaths, Lt inguinal nodes, (no anastomosis due to Lt ALND in 1990); and then Lt LE working from proximal to distal, then retracing all steps (with intact sequence) but will continue intact for remaining sessions for Lt LE) Compression Bandaging: Cocoa butter applied from foot to knees, thin stockinette to knees then bandages as follows: Elastomull to toes 1-5, Artiflex from foot to knee, 1-6 cm with Figure 8, then 8-cm with heel locks  then continued to mid leg, then 1-12 cm short stretch compression bandage from ankle to knee. Donned pts velcro shoes after and she reports bandages comfortable by end of session. She was reminded remedial exs to perform with compression bandages on.   11/13/21: Manual Therapy MLD: In Supine with HOB elevated and legs elevated on large bolster with pillows under knees: Short neck, 5 diaphragmatic breaths, Lt inguinal nodes, (no anastomosis due to Lt ALND in 1990); and then [place] working from proximal to distal, then retracing all steps (today did non intact sequence but will switch to intact for remaining sessions for Lt LE) Compression Bandaging: Cocoa butter applied from foot to knees, thin stockinette to knees then bandages as follows: Elastomull to toes 1-4, Artiflex from foot to knee, 1-6 cm for combo  Roman sandal and HAS, then 8-cm started HAS but only 1 pass of this and then continued bandage up to calf, then 1-12 cm short stretch compression bandage from ankle to knee. Donned pts velcro shoes after and she reports bandages comfortable by end of session. She was instructed in remedial exs to perform with compression bandages on.  Wrapped all toes at pt request. Gave pt some 1/4 in foam to pad small toes in shoe and some velcro to  add to shoe. Also discussed a Teva style sandal with a back for more comfort with walking  11/12/21: Pt did not have appropriate footwear to bandage today so will begin bandaging at next session  PATIENT EDUCATION:  Education details: educated pt in anatomy and physiology of the lymphatic system and basic principals of self MLD, compression pump, availability of compression stockings and velcro compression garments Person educated: Patient Education method: Customer service manager Education comprehension: verbalized understanding   HOME EXERCISE PROGRAM: Walk with compression bandages intact once we begin bandaging at next appointment  ASSESSMENT:  CLINICAL IMPRESSION: Pt fit into size medium Juzo socks and they were ordered on [employer]. Pt arrived with mild redness, itching after walking in the park yesterday with her wraps on. Used TG soft instead of stockinette after pt applied her hydrocortisone. Wrapped legs as usual but instructed pt to remove if they become too itchy. Did not have time for MLD      OBJECTIVE IMPAIRMENTS decreased knowledge of condition, decreased knowledge of use of DME, and increased edema.   ACTIVITY LIMITATIONS  none  PARTICIPATION LIMITATIONS:  none  PERSONAL FACTORS  none  are also affecting patient's functional outcome.   REHAB POTENTIAL: Excellent  CLINICAL DECISION MAKING: Stable/uncomplicated  EVALUATION COMPLEXITY: Low  GOALS: Goals reviewed with patient? Yes  SHORT TERM GOALS=LONG TERM GOALS Target  date: 12/24/21  Pt will obtain appropriate compression garments for long term management of lymphedema.  Baseline: Goal status: INITIAL  2.  Pt will be independent in self MLD for long term management of lymphedema.  Baseline:  Goal status: INITIAL  3.  Pt will receive a trial of the FlexiTouch compression pump for long term management of lymphedema.  Baseline:  Goal status: INITIAL  4.  Pt will demonstrate a 2 cm decrease of edema at 30 cm superior to medial malleoli on L LE to decrease risk of infection.  Baseline:  Goal status: INITIAL PLAN: PT FREQUENCY: 3x/week  PT DURATION: 6 weeks  PLANNED INTERVENTIONS: Therapeutic exercises, Patient/Family education, Self Care, Orthotic/Fit training, DME instructions, Manual lymph drainage, Compression bandaging, Vasopneumatic device, and Manual therapy  PLAN FOR NEXT SESSION: How is itchiness of legs,educate pt that we received the benefits back from tactile and the flexi touch will be covered, educate pt about benefits of the pump,  Cont CDT to bilateral LE's, how did pt tolerate bandaging? and instruct pt in self MLD technique and issue handout (do Rt LE next),  (On Left side to Inguinal only due to Left ALND)   Claris Pong, PT 11/20/2021, 11:05 AM

## 2021-11-22 ENCOUNTER — Encounter: Payer: Self-pay | Admitting: Physical Therapy

## 2021-11-22 ENCOUNTER — Ambulatory Visit: Payer: Medicare HMO | Admitting: Physical Therapy

## 2021-11-22 DIAGNOSIS — Z8542 Personal history of malignant neoplasm of other parts of uterus: Secondary | ICD-10-CM

## 2021-11-22 DIAGNOSIS — I890 Lymphedema, not elsewhere classified: Secondary | ICD-10-CM | POA: Diagnosis not present

## 2021-11-22 NOTE — Therapy (Signed)
OUTPATIENT PHYSICAL THERAPY ONCOLOGY EVALUATION  Patient Name: Beth Simon MRN: [id number] DOB:Aug 09, 1940, 81 y.o., female Today's Date: 11/22/2021   PT End of Session - 11/22/21 0908     Visit Number 6    Number of Visits 13    Date for PT Re-Evaluation 12/24/21    PT Start Time 0907    PT Stop Time 0954    PT Time Calculation (min) 47 min    Activity Tolerance Patient tolerated treatment well    Behavior During Therapy [facility] for tasks assessed/performed             Past Medical History:  Diagnosis Date   Adenomatous polyps 01/2016   Anemia    Breast cancer ([place]) 1990   bilateral mastectomy, adenoca breast-left MRM, reconstruction, chemo   Bronchitis last 2 weeks   saw dr Felipa Eth 02-28-2014, he said no antibiotics needed, nonproductive cough   Complication of anesthesia    Cystitis    cytoxen, had once or twice   Family history of breast cancer    History of breast cancer    History of radiation therapy 2/10, 2/12, 2/18, 2/24, 05/29/14   vaginal cuff/ 30 Gy/ 5 fx   Hypertension    Macular degeneration    Neck pain    taking physictal therapy last 2 weeks   Osteoporosis    On Prolia.   PONV (postoperative nausea and vomiting)    Shoulder pain    taking physical therapy for last few weeks   Uterine cancer ([place]) 03/21/2014   MLH1/PMS2 LOH   Uterine fibroid    VAIN I (vaginal intraepithelial neoplasia grade I) 2018   positive HR HPV.   Vasovagal syncopes    Past Surgical History:  Procedure Laterality Date   BREAST IMPLANT REMOVAL  6/94   left breast   BREAST RECONSTRUCTION  1990   left   CATARACT EXTRACTION Bilateral    EXCISION VAGINAL CYST     MASTECTOMY Right 10/08/06   prophylactic   RADICAL MASTECTOMY LND  1990   left, chemo done   ROBOTIC ASSISTED TOTAL HYSTERECTOMY WITH BILATERAL SALPINGO OOPHERECTOMY Bilateral 03/21/2014   Procedure: ROBOTIC ASSISTED TOTAL HYSTERECTOMY WITH BILATERAL SALPINGO OOPHORECTOMY ;  Surgeon: Janie Morning, MD;   Location: [facility];  Service: Gynecology;  Laterality: Bilateral;   Patient Active Problem List   Diagnosis Date Noted   Age-related osteoporosis without current pathological fracture 03/19/2020   Personal history of colonic polyps 03/19/2020   Transient cerebral ischemia 09/15/2017   Pure hypercholesterolemia 09/15/2017   Pulmonary nodule 09/15/2017   Hypertension 09/15/2017   Gastro-esophageal reflux disease without esophagitis 09/15/2017   VAIN I (vaginal intraepithelial neoplasia grade I) 06/23/2017   Genetic testing 05/23/2014   History of breast cancer    Family history of breast cancer    Endometrial cancer ([place]) 03/21/2014   Uterine cancer ([place]) 03/21/2014   S/P mastectomy, bilateral 05/02/2013   Situational stress 05/02/2013   Osteoporosis, unspecified 05/02/2013   Breast cancer, left breast ([place]) 05/02/2013    PCP: Lajean Manes, MD  REFERRING PROVIDER: Linus Mako, MD   REFERRING DIAG: I89.0 (ICD-10-CM) - Lymphedema, not elsewhere classified   THERAPY DIAG:  Lymphedema, not elsewhere classified  History of uterine cancer  ONSET DATE: 05/01/21  Rationale for Evaluation and Treatment Rehabilitation  SUBJECTIVE  SUBJECTIVE STATEMENT: The bandages felt too tight. I had to take them off on Thursday morning because I could not sleep at all Wednesday night due to discomfort from the bandages. My new socks came today. The rash was back on my ankle when I woke up and it is itching.    PERTINENT HISTORY:  History notable for left breast cancer ER-PR-  treated with radical mastectomy and chemotherapy in 1990.  Subsequently underwent right simple mastectomy 2008 without reconstruction. Denies use of tamoxifen   On 03/21/14  she underwent a robotic hysterectomy for treatment of uterine  cancer, BSO. Frozen section revealed grade 1 cancer. Final pathology revealed a grade 2 lesion with LVSI present and she was staged at stage IA. She was recommended vaginal brachytherapy as adjuvant therapy to reduce risk for local recurrence.  Dr Lanell Persons delivered vaginal brachytherapy completed on 05/29/14. Her treatments were without complication. She has been using her vaginal dilator since.  PAIN:  Are you having pain? No  PRECAUTIONS: Other: bilateral LE lymphedema  WEIGHT BEARING RESTRICTIONS No  FALLS:  Has patient fallen in last 6 months? No  LIVING ENVIRONMENT: Lives with: lives alone Lives in: House/apartment Stairs: No;  Has following equipment at home: None  OCCUPATION: Retried  LEISURE: walks a couple of miles on Tuesday morning, on Thursday goes to piliates with [employer]   HAND DOMINANCE : right   PRIOR LEVEL OF FUNCTION: Independent  PATIENT GOALS to have a tool in my toolkit to relieve the swelling and redistribute the lymphatic fluid   OBJECTIVE  COGNITION:  Overall cognitive status: Within functional limits for tasks assessed   PALPATION: Pitting edema at bilateral ankles  OBSERVATIONS / OTHER ASSESSMENTS: LLE more swollen than RLE  POSTURE: forward head and rounded shoulders LYMPHEDEMA ASSESSMENTS:   SURGERY TYPE/DATE: uterine cancer with total hysterectomy in 2016, 1990 L mastectomy and ALND, 2008 R mastectomy  NUMBER OF LYMPH NODES REMOVED: none  CHEMOTHERAPY: completed for L breast cancer  RADIATION:completed  HORMONE TREATMENT: none  INFECTIONS: none  LYMPHEDEMA ASSESSMENTS:   LOWER EXTREMITY LANDMARK RIGHT eval RIGHT 11/18/21 RIGHT 11/22/21  At groin     30 cm proximal to suprapatella     20 cm proximal to suprapatella 50.2 49   10 cm proximal to suprapatella 42 41.2   At midpatella / popliteal crease 38.6 38   30 cm proximal to floor at lateral plantar foot 38.6 37.7 37.5  20 cm proximal to floor at lateral plantar foot 30.4  30._0 cm proximal to floor at lateral plantar foot 24.5 23 23.5  Circumference of ankle/heel     5 cm proximal to 1st MTP joint 20.5 20 20.5  Across MTP joint 21 20.7 21.5  Around proximal great toe 7.5 7.5 7.3  (Blank rows = not tested)  LOWER EXTREMITY LANDMARK LEFT eval LEFT 11/18/21   At groin     30 cm proximal to suprapatella     20 cm proximal to suprapatella 51.3 51   10 cm proximal to suprapatella 43.5 42.5   At midpatella / popliteal crease 39 38.5   30 cm proximal to floor at lateral plantar foot 39.5  38.5 38  20 cm proximal to floor at lateral plantar foot 32.5 30.5 30.3  10 cm proximal to floor at lateral plantar foot 26.4 23.2 24.7  Circumference of ankle/heel     5 cm proximal to 1st MTP joint 21 20.7 20.7  Across MTP joint 21.4 20.9 21  Around proximal great toe 7.6 7.4 7.5  (Blank rows = not tested)   LLIS:   22.06  TODAY'S TREATMENT  11/22/21 Pt received her [employer] comfort socks. Educated pt on proper donning/doffing technique. Pt had difficulty donning the 20-30 due to pain in her thumb joints. She was able to don the 15-20 so she will keep these and return the 20-30. Remeasured pt today since she has had the bandages off since Wednesday. Her legs have gone up in some areas but not a great deal. Others areas have reduced some. She wore her over the counter compression stockings while she did not have on her bandages. Educated pt in self MLD today and issued handout. Had pt return demosntrate every step and correct skin stretch technique as follows with pt returning therapist demo:  In sitting: 5 diaphragmatic breaths, Lt inguinal nodes, (no anastomosis due to Lt ALND in 1990); and then [place] working from proximal to distal, then retracing all steps (with intact sequence) but will continue intact for remaining sessions for Lt LE) then on R side practicing non intact sequence since axillary lymph nodes are intact on this side.    11/20/2021 Pt measured for  [employer] and fit into size medium. Ordered 15-20 and 20-30 compression and she will return one. Pt applied hydrocortisone and therapist cut size small TG soft for both legs to use instead of stockinette. Compression Bandaging: Pt applied hydrocortisone cream bilaterally,TG soft foot to knee, to knees then bandages as follows: Elastomull to toes 1-5, Artiflex from foot to knee, 1-6 cm with Figure 8, then 8-cm with heel locks  then continued to mid leg, then 1-12 cm short stretch compression bandage from ankle to knee. Donned pts velcro shoes after and she reports bandages comfortable by end of session.  Pt to remove wraps if itching is too bad, but had no itching when leaving    11/18/21: Removed bandages and washed legs with soap and water. Pt brought in old compression garments and therapist assessed for fit. They were a size too large. Pt reports inability to don them. Remeasured circumferences and bilateral LE circumferences have decrease. Had pt practice donning garments with wire donner and with slippy grip by Juzo and pt unable to don with either due to soreness and pain in her hand joints. Discussed compression socks with pt - not optimal for edema but if she is able to don might be a good option.  Compression Bandaging: Cocoa butter applied from foot to knees, thin stockinette to knees then bandages as follows: Elastomull to toes 1-5, Artiflex from foot to knee, 1-6 cm with Figure 8, then 8-cm with heel locks  then continued to mid leg, then 1-12 cm short stretch compression bandage from ankle to knee. Donned pts velcro shoes after and she reports bandages comfortable by end of session.   11/15/2021 Pt came in wrapped.  Wraps removed MLD: In Supine with HOB elevated and legs elevated on large bolster with pillows under knees: Short neck, 5 diaphragmatic breaths, Lt inguinal nodes, (no anastomosis due to Lt ALND in 1990); and then Lt LE working from proximal to distal, then  retracing all steps (with intact sequence) but will continue intact for remaining sessions for Lt LE) Compression Bandaging: Cocoa butter applied from foot to knees, thin stockinette to knees then bandages as follows: Elastomull to toes 1-5, Artiflex from foot to knee, 1-6 cm with Figure 8, then 8-cm with heel locks  then continued to  mid leg, then 1-12 cm short stretch compression bandage from ankle to knee. Donned pts velcro shoes after and she reports bandages comfortable by end of session. She was reminded remedial exs to perform with compression bandages on.   11/13/21: Manual Therapy MLD: In Supine with HOB elevated and legs elevated on large bolster with pillows under knees: Short neck, 5 diaphragmatic breaths, Lt inguinal nodes, (no anastomosis due to Lt ALND in 1990); and then [place] working from proximal to distal, then retracing all steps (today did non intact sequence but will switch to intact for remaining sessions for Lt LE) Compression Bandaging: Cocoa butter applied from foot to knees, thin stockinette to knees then bandages as follows: Elastomull to toes 1-4, Artiflex from foot to knee, 1-6 cm for combo Roman sandal and HAS, then 8-cm started HAS but only 1 pass of this and then continued bandage up to calf, then 1-12 cm short stretch compression bandage from ankle to knee. Donned pts velcro shoes after and she reports bandages comfortable by end of session. She was instructed in remedial exs to perform with compression bandages on.  Wrapped all toes at pt request. Gave pt some 1/4 in foam to pad small toes in shoe and some velcro to add to shoe. Also discussed a Teva style sandal with a back for more comfort with walking  11/12/21: Pt did not have appropriate footwear to bandage today so will begin bandaging at next session  PATIENT EDUCATION:  Education details: educated pt in anatomy and physiology of the lymphatic system and basic principals of self MLD, compression pump, availability  of compression stockings and velcro compression garments Person educated: Patient Education method: Customer service manager Education comprehension: verbalized understanding   HOME EXERCISE PROGRAM: Walk with compression bandages intact once we begin bandaging at next appointment  ASSESSMENT:  CLINICAL IMPRESSION: Pt to wear 15-20 Juzo power compression socks during day time hours and perform self MLD daily using handout. Pt was able to don 15-20mmHg socks independently and will return 20-30 as she was unable to don these. Will reassess circumferences next session to see if these were able to contain her edema. Pt to set up demo with flexitouch soon.       OBJECTIVE IMPAIRMENTS decreased knowledge of condition, decreased knowledge of use of DME, and increased edema.   ACTIVITY LIMITATIONS  none  PARTICIPATION LIMITATIONS:  none  PERSONAL FACTORS  none  are also affecting patient's functional outcome.   REHAB POTENTIAL: Excellent  CLINICAL DECISION MAKING: Stable/uncomplicated  EVALUATION COMPLEXITY: Low  GOALS: Goals reviewed with patient? Yes  SHORT TERM GOALS=LONG TERM GOALS Target date: 12/24/21  Pt will obtain appropriate compression garments for long term management of lymphedema.  Baseline: Goal status: INITIAL  2.  Pt will be independent in self MLD for long term management of lymphedema.  Baseline:  Goal status: INITIAL  3.  Pt will receive a trial of the FlexiTouch compression pump for long term management of lymphedema.  Baseline:  Goal status: INITIAL  4.  Pt will demonstrate a 2 cm decrease of edema at 30 cm superior to medial malleoli on L LE to decrease risk of infection.  Baseline:  Goal status: INITIAL PLAN: PT FREQUENCY: 3x/week  PT DURATION: 6 weeks  PLANNED INTERVENTIONS: Therapeutic exercises, Patient/Family education, Self Care, Orthotic/Fit training, DME instructions, Manual lymph drainage, Compression bandaging, Vasopneumatic  device, and Manual therapy  PLAN FOR NEXT SESSION: How is itchiness of legs, cont to instruct in self MLD,   (  On Left side to Inguinal only due to Left ALND)   Manus Gunning, PT 11/22/2021, 10:00 AM

## 2021-11-22 NOTE — Patient Instructions (Signed)
Deep Effective Breath   Standing, sitting, or laying down place both hands on the belly. Take a deep breath IN, expanding the belly; then breath OUT, contracting the belly. Repeat __5__ times. Do __2-3__ sessions per day and before each self massage.  http://[email or web address].exer.us/866   Copyright  VHI. All rights reserved.  Inguinal Nodes to Axilla - Clear   On involved side, at armpit, make _5__ in-place circles. Then from hip proceed in sections to armpit with stationary circles or pumps _5_ times, this is your pathway. Do _1__ time per day.  Copyright  VHI. All rights reserved.  LEG: Knee to Hip - Clear   Pump up outer thigh of involved leg from knee to outer hip. Then do stationary circles from inner to outer thigh, then do outer thigh again. Next, interlace fingers behind knee IF ABLE and make in-place circles. Do _5_ times of each sequence.  Do _1__ time per day.  Copyright  VHI. All rights reserved.  LEG: Ankle to Hip Sweep   Hands on sides of ankle of involved leg, pump _5__ times up both sides of lower leg, then retrace steps up outer thigh to hip as before and back to pathway. Do _2-3_ times. Do __1_ time per day.  Copyright  VHI. All rights reserved.  FOOT: Dorsum of Foot and Toes Massage   One hand on top of foot make _5_ stationary circles or pumps, then either on top of toes or each individual toe do _5_ pumps. Then retrace all steps pumping back up both sides of lower leg, outer thigh, and then pathway. Finish with what you started with, _5_ circles at involved side arm pit. All _2-3_ times at each sequence. Do _1__ time per day.  Copyright  VHI. All rights reserved.    

## 2021-11-25 ENCOUNTER — Ambulatory Visit: Payer: Medicare HMO

## 2021-11-25 DIAGNOSIS — Z8542 Personal history of malignant neoplasm of other parts of uterus: Secondary | ICD-10-CM | POA: Diagnosis not present

## 2021-11-25 DIAGNOSIS — R293 Abnormal posture: Secondary | ICD-10-CM

## 2021-11-25 DIAGNOSIS — I890 Lymphedema, not elsewhere classified: Secondary | ICD-10-CM | POA: Diagnosis not present

## 2021-11-25 NOTE — Therapy (Signed)
OUTPATIENT PHYSICAL THERAPY ONCOLOGY TREATMENT  Patient Name: Beth Simon MRN: [id number] DOB:07-05-1940, 81 y.o., female Today's Date: 11/25/2021   PT End of Session - 11/25/21 1004     Visit Number 7    Number of Visits 13    Date for PT Re-Evaluation 12/24/21    PT Start Time 1005    PT Stop Time 1059    PT Time Calculation (min) 54 min    Activity Tolerance Patient tolerated treatment well    Behavior During Therapy [facility] for tasks assessed/performed             Past Medical History:  Diagnosis Date   Adenomatous polyps 01/2016   Anemia    Breast cancer ([place]) 1990   bilateral mastectomy, adenoca breast-left MRM, reconstruction, chemo   Bronchitis last 2 weeks   saw dr Felipa Eth 02-28-2014, he said no antibiotics needed, nonproductive cough   Complication of anesthesia    Cystitis    cytoxen, had once or twice   Family history of breast cancer    History of breast cancer    History of radiation therapy 2/10, 2/12, 2/18, 2/24, 05/29/14   vaginal cuff/ 30 Gy/ 5 fx   Hypertension    Macular degeneration    Neck pain    taking physictal therapy last 2 weeks   Osteoporosis    On Prolia.   PONV (postoperative nausea and vomiting)    Shoulder pain    taking physical therapy for last few weeks   Uterine cancer ([place]) 03/21/2014   MLH1/PMS2 LOH   Uterine fibroid    VAIN I (vaginal intraepithelial neoplasia grade I) 2018   positive HR HPV.   Vasovagal syncopes    Past Surgical History:  Procedure Laterality Date   BREAST IMPLANT REMOVAL  6/94   left breast   BREAST RECONSTRUCTION  1990   left   CATARACT EXTRACTION Bilateral    EXCISION VAGINAL CYST     MASTECTOMY Right 10/08/06   prophylactic   RADICAL MASTECTOMY LND  1990   left, chemo done   ROBOTIC ASSISTED TOTAL HYSTERECTOMY WITH BILATERAL SALPINGO OOPHERECTOMY Bilateral 03/21/2014   Procedure: ROBOTIC ASSISTED TOTAL HYSTERECTOMY WITH BILATERAL SALPINGO OOPHORECTOMY ;  Surgeon: Janie Morning, MD;   Location: [facility];  Service: Gynecology;  Laterality: Bilateral;   Patient Active Problem List   Diagnosis Date Noted   Age-related osteoporosis without current pathological fracture 03/19/2020   Personal history of colonic polyps 03/19/2020   Transient cerebral ischemia 09/15/2017   Pure hypercholesterolemia 09/15/2017   Pulmonary nodule 09/15/2017   Hypertension 09/15/2017   Gastro-esophageal reflux disease without esophagitis 09/15/2017   VAIN I (vaginal intraepithelial neoplasia grade I) 06/23/2017   Genetic testing 05/23/2014   History of breast cancer    Family history of breast cancer    Endometrial cancer ([employer]) 03/21/2014   Uterine cancer ([place]) 03/21/2014   S/P mastectomy, bilateral 05/02/2013   Situational stress 05/02/2013   Osteoporosis, unspecified 05/02/2013   Breast cancer, left breast ([place]) 05/02/2013    PCP: Lajean Manes, MD  REFERRING PROVIDER: Linus Mako, MD   REFERRING DIAG: I89.0 (ICD-10-CM) - Lymphedema, not elsewhere classified   THERAPY DIAG:  Lymphedema, not elsewhere classified  History of uterine cancer  Abnormal posture  ONSET DATE: 05/01/21  Rationale for Evaluation and Treatment Rehabilitation  SUBJECTIVE  SUBJECTIVE STATEMENT: I had the pump trial on Saturday. They had a hard time getting it on me. I have a 900 dollar co-pay or a a20 % co-pay per my insurance.  I was in a rush today so I didn't put my compression socks on. I like the socks. They are still a little hard to get on but easier than the other stockings.   PERTINENT HISTORY:  History notable for left breast cancer ER-PR-  treated with radical mastectomy and chemotherapy in 1990.  Subsequently underwent right simple mastectomy 2008 without reconstruction. Denies use of tamoxifen   On 03/21/14   she underwent a robotic hysterectomy for treatment of uterine cancer, BSO. Frozen section revealed grade 1 cancer. Final pathology revealed a grade 2 lesion with LVSI present and she was staged at stage IA. She was recommended vaginal brachytherapy as adjuvant therapy to reduce risk for local recurrence.  Dr Lanell Persons delivered vaginal brachytherapy completed on 05/29/14. Her treatments were without complication. She has been using her vaginal dilator since.  PAIN:  Are you having pain? No  PRECAUTIONS: Other: bilateral LE lymphedema  WEIGHT BEARING RESTRICTIONS No  FALLS:  Has patient fallen in last 6 months? No  LIVING ENVIRONMENT: Lives with: lives alone Lives in: House/apartment Stairs: No;  Has following equipment at home: None  OCCUPATION: Retried  LEISURE: walks a couple of miles on Tuesday morning, on Thursday goes to piliates with [employer]   HAND DOMINANCE : right   PRIOR LEVEL OF FUNCTION: Independent  PATIENT GOALS to have a tool in my toolkit to relieve the swelling and redistribute the lymphatic fluid   OBJECTIVE  COGNITION:  Overall cognitive status: Within functional limits for tasks assessed   PALPATION: Pitting edema at bilateral ankles  OBSERVATIONS / OTHER ASSESSMENTS: LLE more swollen than RLE  POSTURE: forward head and rounded shoulders LYMPHEDEMA ASSESSMENTS:   SURGERY TYPE/DATE: uterine cancer with total hysterectomy in 2016, 1990 L mastectomy and ALND, 2008 R mastectomy  NUMBER OF LYMPH NODES REMOVED: none  CHEMOTHERAPY: completed for L breast cancer  RADIATION:completed  HORMONE TREATMENT: none  INFECTIONS: none  LYMPHEDEMA ASSESSMENTS:   LOWER EXTREMITY LANDMARK RIGHT eval RIGHT 11/18/21 RIGHT 11/22/21 11/25/2021  At groin      30 cm proximal to suprapatella      20 cm proximal to suprapatella 50.2 49    10 cm proximal to suprapatella 42 41.2    At midpatella / popliteal crease 38.6 38    30 cm proximal to floor at lateral  plantar foot 38.6 37.7 37.5 37  20 cm proximal to floor at lateral plantar foot 30.4 30.$RemoveBefo'1 30 29  10 'kDKnbHsATCz$ cm proximal to floor at lateral plantar foot 24.5 23 23.5 22.7  Circumference of ankle/heel      5 cm proximal to 1st MTP joint 20.5 20 20.5 20.7  Across MTP joint 21 20.7 21.5 20.7  Around proximal great toe 7.5 7.5 7.3 7.5  (Blank rows = not tested)  LOWER EXTREMITY LANDMARK LEFT eval LEFT 11/18/21 11/22/2021 11/25/2021  At groin      30 cm proximal to suprapatella      20 cm proximal to suprapatella 51.3 51    10 cm proximal to suprapatella 43.5 42.5    At midpatella / popliteal crease 39 38.5    30 cm proximal to floor at lateral plantar foot 39.5  38.5 38 37.7  20 cm proximal to floor at lateral plantar foot 32.5 30.5 30.$RemoveBefo'3 30  10 'teitVziwofB$ cm proximal  to floor at lateral plantar foot 26.4 23.2 24.7 24.1  Circumference of ankle/heel      5 cm proximal to 1st MTP joint 21 20.7 20.7 21.0  Across MTP joint 21.4 20.9 21 20.9  Around proximal great toe 7.6 7.4 7.5 7.5  (Blank rows = not tested)   LLIS:   22.06  TODAY'S TREATMENT  11/25/2021 Measured pts legs with good results noted. Left leg still larger than right. Pt requested that I perform MLD on her today so she can feel it, and she preferred to do lying down rather than sitting up.  In supine with legs elevated: 5 diaphragmatic breaths, Lt inguinal nodes, (no anastomosis due to Lt ALND in 1990); and then Lt LE working from proximal to distal, then retracing all steps (with intact sequence) and will continue intact for remaining sessions for Lt LE) then on R side performed non intact sequence since axillary lymph nodes are intact on this side and directed to axillary LN's.  Therapist demonstrated what it feels like from therapist position, and then how it would feel if she did it on herself and answered pt questions. She demonstrated LN activation and right inguinal-axillary pathway with good technique after review    11/22/21 Pt received  her [employer] comfort socks. Educated pt on proper donning/doffing technique. Pt had difficulty donning the 20-30 due to pain in her thumb joints. She was able to don the 15-20 so she will keep these and return the 20-30. Remeasured pt today since she has had the bandages off since Wednesday. Her legs have gone up in some areas but not a great deal. Others areas have reduced some. She wore her over the counter compression stockings while she did not have on her bandages. Educated pt in self MLD today and issued handout. Had pt return demosntrate every step and correct skin stretch technique as follows with pt returning therapist demo:  In sitting: 5 diaphragmatic breaths, Lt inguinal nodes, (no anastomosis due to Lt ALND in 1990); and then [place] working from proximal to distal, then retracing all steps (with intact sequence) but will continue intact for remaining sessions for Lt LE) then on R side practicing non intact sequence since axillary lymph nodes are intact on this side.    11/20/2021 Pt measured for [employer] and fit into size medium. Ordered 15-20 and 20-30 compression and she will return one. Pt applied hydrocortisone and therapist cut size small TG soft for both legs to use instead of stockinette. Compression Bandaging: Pt applied hydrocortisone cream bilaterally,TG soft foot to knee, to knees then bandages as follows: Elastomull to toes 1-5, Artiflex from foot to knee, 1-6 cm with Figure 8, then 8-cm with heel locks  then continued to mid leg, then 1-12 cm short stretch compression bandage from ankle to knee. Donned pts velcro shoes after and she reports bandages comfortable by end of session.  Pt to remove wraps if itching is too bad, but had no itching when leaving    11/18/21: Removed bandages and washed legs with soap and water. Pt brought in old compression garments and therapist assessed for fit. They were a size too large. Pt reports inability to don them. Remeasured  circumferences and bilateral LE circumferences have decrease. Had pt practice donning garments with wire donner and with slippy grip by [employer] and pt unable to don with either due to soreness and pain in her hand joints. Discussed compression socks with pt - not optimal for  edema but if she is able to don might be a good option.  Compression Bandaging: Cocoa butter applied from foot to knees, thin stockinette to knees then bandages as follows: Elastomull to toes 1-5, Artiflex from foot to knee, 1-6 cm with Figure 8, then 8-cm with heel locks  then continued to mid leg, then 1-12 cm short stretch compression bandage from ankle to knee. Donned pts velcro shoes after and she reports bandages comfortable by end of session.   11/15/2021 Pt came in wrapped.  Wraps removed MLD: In Supine with HOB elevated and legs elevated on large bolster with pillows under knees: Short neck, 5 diaphragmatic breaths, Lt inguinal nodes, (no anastomosis due to Lt ALND in 1990); and then Lt LE working from proximal to distal, then retracing all steps (with intact sequence) but will continue intact for remaining sessions for Lt LE) Compression Bandaging: Cocoa butter applied from foot to knees, thin stockinette to knees then bandages as follows: Elastomull to toes 1-5, Artiflex from foot to knee, 1-6 cm with Figure 8, then 8-cm with heel locks  then continued to mid leg, then 1-12 cm short stretch compression bandage from ankle to knee. Donned pts velcro shoes after and she reports bandages comfortable by end of session. She was reminded remedial exs to perform with compression bandages on.   11/13/21: Manual Therapy MLD: In Supine with HOB elevated and legs elevated on large bolster with pillows under knees: Short neck, 5 diaphragmatic breaths, Lt inguinal nodes, (no anastomosis due to Lt ALND in 1990); and then [place] working from proximal to distal, then retracing all steps (today did non intact sequence but will switch to intact for  remaining sessions for Lt LE) Compression Bandaging: Cocoa butter applied from foot to knees, thin stockinette to knees then bandages as follows: Elastomull to toes 1-4, Artiflex from foot to knee, 1-6 cm for combo Roman sandal and HAS, then 8-cm started HAS but only 1 pass of this and then continued bandage up to calf, then 1-12 cm short stretch compression bandage from ankle to knee. Donned pts velcro shoes after and she reports bandages comfortable by end of session. She was instructed in remedial exs to perform with compression bandages on.  Wrapped all toes at pt request. Gave pt some 1/4 in foam to pad small toes in shoe and some velcro to add to shoe. Also discussed a Teva style sandal with a back for more comfort with walking  11/12/21: Pt did not have appropriate footwear to bandage today so will begin bandaging at next session  PATIENT EDUCATION:  Education details: educated pt in anatomy and physiology of the lymphatic system and basic principals of self MLD, compression pump, availability of compression stockings and velcro compression garments Person educated: Patient Education method: Customer service manager Education comprehension: verbalized understanding   HOME EXERCISE PROGRAM: Walk with compression bandages intact once we begin bandaging at next appointment  ASSESSMENT:  CLINICAL IMPRESSION: Pts legs were re-measured with good result bilaterally, although left leg continues to be larger than right. MLD was mainly performed by therapist at pt request, with education to pt. While performing.She was able to don her 15-20 stockings independently without much discomfort in her hands  OBJECTIVE IMPAIRMENTS decreased knowledge of condition, decreased knowledge of use of DME, and increased edema.   ACTIVITY LIMITATIONS  none  PARTICIPATION LIMITATIONS:  none  PERSONAL FACTORS  none  are also affecting patient's functional outcome.   REHAB POTENTIAL: Excellent  CLINICAL  DECISION  MAKING: Stable/uncomplicated  EVALUATION COMPLEXITY: Low  GOALS: Goals reviewed with patient? Yes  SHORT TERM GOALS=LONG TERM GOALS Target date: 12/24/21  Pt will obtain appropriate compression garments for long term management of lymphedema.  Baseline: Goal status: INITIAL  2.  Pt will be independent in self MLD for long term management of lymphedema.  Baseline:  Goal status: INITIAL  3.  Pt will receive a trial of the FlexiTouch compression pump for long term management of lymphedema.  Baseline:  Goal status: INITIAL  4.  Pt will demonstrate a 2 cm decrease of edema at 30 cm superior to medial malleoli on L LE to decrease risk of infection.  Baseline:  Goal status: INITIAL PLAN: PT FREQUENCY: 3x/week  PT DURATION: 6 weeks  PLANNED INTERVENTIONS: Therapeutic exercises, Patient/Family education, Self Care, Orthotic/Fit training, DME instructions, Manual lymph drainage, Compression bandaging, Vasopneumatic device, and Manual therapy  PLAN FOR NEXT SESSION: How is itchiness of legs, cont to instruct in self MLD,   (On Left side to Inguinal only due to Left ALND)   Claris Pong, PT 11/25/2021, 12:09 PM

## 2021-11-26 NOTE — Progress Notes (Signed)
Subjective: Chief Complaint  Patient presents with   Nail Problem    Routine foot care, nail trim, Callus     81 y.o. returns the office today for painful, elongated, thickened toenails which she is unable to trim herself as well as for a callus to her second digit on her left foot.  She did go to physical therapy to work on the compression wraps of the swelling which has been helping.  No open lesions.  Objective: AAO 3, NAD DP/PT pulses palpable, CRT less than 3 seconds Chronic swelling present bilaterally.  There is no open lesions. Nails hypertrophic, dystrophic, elongated, brittle, discolored 10. There is tenderness overlying the nails 1-5 bilaterally. There is no surrounding erythema or drainage along the nail sites.  Chronic continuation incurvation to the lesser digit toenails left side worse than right.   Hyperkeratotic lesions of the distal aspect left second digit. No underlying ulceration, drainage or other signs of infection.  No other areas of discomfort today. Hammertoes No pain with calf compression, warmth, erythema.  Assessment: Symptomatic onychomycosis, ingrown toenails; hyperkeratotic lesion left foot; lower extremity edema left side worse than right  Plan: -Treatment options including alternatives, risks, complications were discussed -Nails sharply debrided 10 without any complications or bleeding.  -Hyperkeratotic lesion sharply debrided 1 without complication/bleeding.  This is due to hammertoe deformity discussed offloading. -Physical therapy for compression wraps, elevation -Monitor for any clinical signs or symptoms of infection and directed to call the office immediately should any occur or go to the ER.  Celesta Gentile, DPM

## 2021-11-27 ENCOUNTER — Ambulatory Visit: Payer: Medicare HMO | Admitting: Physical Therapy

## 2021-11-27 ENCOUNTER — Encounter: Payer: Self-pay | Admitting: Physical Therapy

## 2021-11-27 DIAGNOSIS — I890 Lymphedema, not elsewhere classified: Secondary | ICD-10-CM | POA: Diagnosis not present

## 2021-11-27 DIAGNOSIS — I10 Essential (primary) hypertension: Secondary | ICD-10-CM | POA: Diagnosis not present

## 2021-11-27 DIAGNOSIS — Z8542 Personal history of malignant neoplasm of other parts of uterus: Secondary | ICD-10-CM

## 2021-11-27 DIAGNOSIS — K219 Gastro-esophageal reflux disease without esophagitis: Secondary | ICD-10-CM | POA: Diagnosis not present

## 2021-11-27 DIAGNOSIS — E7800 Pure hypercholesterolemia, unspecified: Secondary | ICD-10-CM | POA: Diagnosis not present

## 2021-11-27 DIAGNOSIS — Z0000 Encounter for general adult medical examination without abnormal findings: Secondary | ICD-10-CM | POA: Diagnosis not present

## 2021-11-27 DIAGNOSIS — Z79899 Other long term (current) drug therapy: Secondary | ICD-10-CM | POA: Diagnosis not present

## 2021-11-27 NOTE — Therapy (Signed)
OUTPATIENT PHYSICAL THERAPY ONCOLOGY TREATMENT  Patient Name: Beth Simon MRN: [id number] DOB:04-16-1940, 81 y.o., female Today's Date: 11/27/2021   PT End of Session - 11/27/21 0907     Visit Number 8    Number of Visits 13    Date for PT Re-Evaluation 12/24/21    PT Start Time 0906    PT Stop Time 0957    PT Time Calculation (min) 51 min    Activity Tolerance Patient tolerated treatment well    Behavior During Therapy [facility] for tasks assessed/performed             Past Medical History:  Diagnosis Date   Adenomatous polyps 01/2016   Anemia    Breast cancer ([place]) 1990   bilateral mastectomy, adenoca breast-left MRM, reconstruction, chemo   Bronchitis last 2 weeks   saw dr Felipa Eth 02-28-2014, he said no antibiotics needed, nonproductive cough   Complication of anesthesia    Cystitis    cytoxen, had once or twice   Family history of breast cancer    History of breast cancer    History of radiation therapy 2/10, 2/12, 2/18, 2/24, 05/29/14   vaginal cuff/ 30 Gy/ 5 fx   Hypertension    Macular degeneration    Neck pain    taking physictal therapy last 2 weeks   Osteoporosis    On Prolia.   PONV (postoperative nausea and vomiting)    Shoulder pain    taking physical therapy for last few weeks   Uterine cancer ([place]) 03/21/2014   MLH1/PMS2 LOH   Uterine fibroid    VAIN I (vaginal intraepithelial neoplasia grade I) 2018   positive HR HPV.   Vasovagal syncopes    Past Surgical History:  Procedure Laterality Date   BREAST IMPLANT REMOVAL  6/94   left breast   BREAST RECONSTRUCTION  1990   left   CATARACT EXTRACTION Bilateral    EXCISION VAGINAL CYST     MASTECTOMY Right 10/08/06   prophylactic   RADICAL MASTECTOMY LND  1990   left, chemo done   ROBOTIC ASSISTED TOTAL HYSTERECTOMY WITH BILATERAL SALPINGO OOPHERECTOMY Bilateral 03/21/2014   Procedure: ROBOTIC ASSISTED TOTAL HYSTERECTOMY WITH BILATERAL SALPINGO OOPHORECTOMY ;  Surgeon: Janie Morning, MD;   Location: [facility];  Service: Gynecology;  Laterality: Bilateral;   Patient Active Problem List   Diagnosis Date Noted   Age-related osteoporosis without current pathological fracture 03/19/2020   Personal history of colonic polyps 03/19/2020   Transient cerebral ischemia 09/15/2017   Pure hypercholesterolemia 09/15/2017   Pulmonary nodule 09/15/2017   Hypertension 09/15/2017   Gastro-esophageal reflux disease without esophagitis 09/15/2017   VAIN I (vaginal intraepithelial neoplasia grade I) 06/23/2017   Genetic testing 05/23/2014   History of breast cancer    Family history of breast cancer    Endometrial cancer ([place]) 03/21/2014   Uterine cancer ([place]) 03/21/2014   S/P mastectomy, bilateral 05/02/2013   Situational stress 05/02/2013   Osteoporosis, unspecified 05/02/2013   Breast cancer, left breast ([place]) 05/02/2013    PCP: Lajean Manes, MD  REFERRING PROVIDER: Linus Mako, MD   REFERRING DIAG: I89.0 (ICD-10-CM) - Lymphedema, not elsewhere classified   THERAPY DIAG:  Lymphedema, not elsewhere classified  History of uterine cancer  ONSET DATE: 05/01/21  Rationale for Evaluation and Treatment Rehabilitation  SUBJECTIVE  SUBJECTIVE STATEMENT: I would like to review the self massage again today.   PERTINENT HISTORY:  History notable for left breast cancer ER-PR-  treated with radical mastectomy and chemotherapy in 1990.  Subsequently underwent right simple mastectomy 2008 without reconstruction. Denies use of tamoxifen   On 03/21/14  she underwent a robotic hysterectomy for treatment of uterine cancer, BSO. Frozen section revealed grade 1 cancer. Final pathology revealed a grade 2 lesion with LVSI present and she was staged at stage IA. She was recommended vaginal brachytherapy as adjuvant  therapy to reduce risk for local recurrence.  Dr Lanell Persons delivered vaginal brachytherapy completed on 05/29/14. Her treatments were without complication. She has been using her vaginal dilator since.  PAIN:  Are you having pain? No  PRECAUTIONS: Other: bilateral LE lymphedema  WEIGHT BEARING RESTRICTIONS No  FALLS:  Has patient fallen in last 6 months? No  LIVING ENVIRONMENT: Lives with: lives alone Lives in: House/apartment Stairs: No;  Has following equipment at home: None  OCCUPATION: Retried  LEISURE: walks a couple of miles on Tuesday morning, on Thursday goes to piliates with [employer]   HAND DOMINANCE : right   PRIOR LEVEL OF FUNCTION: Independent  PATIENT GOALS to have a tool in my toolkit to relieve the swelling and redistribute the lymphatic fluid   OBJECTIVE  COGNITION:  Overall cognitive status: Within functional limits for tasks assessed   PALPATION: Pitting edema at bilateral ankles  OBSERVATIONS / OTHER ASSESSMENTS: LLE more swollen than RLE  POSTURE: forward head and rounded shoulders LYMPHEDEMA ASSESSMENTS:   SURGERY TYPE/DATE: uterine cancer with total hysterectomy in 2016, 1990 L mastectomy and ALND, 2008 R mastectomy  NUMBER OF LYMPH NODES REMOVED: none  CHEMOTHERAPY: completed for L breast cancer  RADIATION:completed  HORMONE TREATMENT: none  INFECTIONS: none  LYMPHEDEMA ASSESSMENTS:   LOWER EXTREMITY LANDMARK RIGHT eval RIGHT 11/18/21 RIGHT 11/22/21 11/25/2021  At groin      30 cm proximal to suprapatella      20 cm proximal to suprapatella 50.2 49    10 cm proximal to suprapatella 42 41.2    At midpatella / popliteal crease 38.6 38    30 cm proximal to floor at lateral plantar foot 38.6 37.7 37.5 37  20 cm proximal to floor at lateral plantar foot 30.4 30.$RemoveBefo'1 30 29  10 'paaZfYCyVqM$ cm proximal to floor at lateral plantar foot 24.5 23 23.5 22.7  Circumference of ankle/heel      5 cm proximal to 1st MTP joint 20.5 20 20.5 20.7  Across MTP  joint 21 20.7 21.5 20.7  Around proximal great toe 7.5 7.5 7.3 7.5  (Blank rows = not tested)  LOWER EXTREMITY LANDMARK LEFT eval LEFT 11/18/21 11/22/2021 11/25/2021  At groin      30 cm proximal to suprapatella      20 cm proximal to suprapatella 51.3 51    10 cm proximal to suprapatella 43.5 42.5    At midpatella / popliteal crease 39 38.5    30 cm proximal to floor at lateral plantar foot 39.5  38.5 38 37.7  20 cm proximal to floor at lateral plantar foot 32.5 30.5 30.$RemoveBefo'3 30  10 'WBtRPWKQnVp$ cm proximal to floor at lateral plantar foot 26.4 23.2 24.7 24.1  Circumference of ankle/heel      5 cm proximal to 1st MTP joint 21 20.7 20.7 21.0  Across MTP joint 21.4 20.9 21 20.9  Around proximal great toe 7.6 7.4 7.5 7.5  (Blank rows = not tested)  LLIS:   22.06  TODAY'S TREATMENT  11/27/21 In supine: 5 diaphragmatic breaths, Lt inguinal nodes, (no anastomosis due to Lt ALND in 1990); and then Lt LE working from proximal to distal, then retracing all steps (with intact sequence) and will continue intact for remaining sessions for Lt LE) educated pt throughout on correct sequence and skin stretch. At the end pt demonstrated LN activation and right inguinal-axillary pathway as well as correct sequence for the rest of the RLE. Pt was able to independently don her compression sock at end of session (therapist did one and pt donned one)  11/25/2021 Measured pts legs with good results noted. Left leg still larger than right. Pt requested that I perform MLD on her today so she can feel it, and she preferred to do lying down rather than sitting up.  In supine with legs elevated: 5 diaphragmatic breaths, Lt inguinal nodes, (no anastomosis due to Lt ALND in 1990); and then Lt LE working from proximal to distal, then retracing all steps (with intact sequence) and will continue intact for remaining sessions for Lt LE) then on R side performed non intact sequence since axillary lymph nodes are intact on this side and  directed to axillary LN's.  Therapist demonstrated what it feels like from therapist position, and then how it would feel if she did it on herself and answered pt questions. She demonstrated LN activation and right inguinal-axillary pathway with good technique after review    11/22/21 Pt received her [employer] comfort socks. Educated pt on proper donning/doffing technique. Pt had difficulty donning the 20-30 due to pain in her thumb joints. She was able to don the 15-20 so she will keep these and return the 20-30. Remeasured pt today since she has had the bandages off since Wednesday. Her legs have gone up in some areas but not a great deal. Others areas have reduced some. She wore her over the counter compression stockings while she did not have on her bandages. Educated pt in self MLD today and issued handout. Had pt return demosntrate every step and correct skin stretch technique as follows with pt returning therapist demo:  In sitting: 5 diaphragmatic breaths, Lt inguinal nodes, (no anastomosis due to Lt ALND in 1990); and then [place] working from proximal to distal, then retracing all steps (with intact sequence) but will continue intact for remaining sessions for Lt LE) then on R side practicing non intact sequence since axillary lymph nodes are intact on this side.    11/20/2021 Pt measured for [employer] and fit into size medium. Ordered 15-20 and 20-30 compression and she will return one. Pt applied hydrocortisone and therapist cut size small TG soft for both legs to use instead of stockinette. Compression Bandaging: Pt applied hydrocortisone cream bilaterally,TG soft foot to knee, to knees then bandages as follows: Elastomull to toes 1-5, Artiflex from foot to knee, 1-6 cm with Figure 8, then 8-cm with heel locks  then continued to mid leg, then 1-12 cm short stretch compression bandage from ankle to knee. Donned pts velcro shoes after and she reports bandages comfortable by end of  session.  Pt to remove wraps if itching is too bad, but had no itching when leaving    11/18/21: Removed bandages and washed legs with soap and water. Pt brought in old compression garments and therapist assessed for fit. They were a size too large. Pt reports inability to don them. Remeasured circumferences and bilateral LE circumferences have  decrease. Had pt practice donning garments with wire donner and with slippy grip by [employer] and pt unable to don with either due to soreness and pain in her hand joints. Discussed compression socks with pt - not optimal for edema but if she is able to don might be a good option.  Compression Bandaging: Cocoa butter applied from foot to knees, thin stockinette to knees then bandages as follows: Elastomull to toes 1-5, Artiflex from foot to knee, 1-6 cm with Figure 8, then 8-cm with heel locks  then continued to mid leg, then 1-12 cm short stretch compression bandage from ankle to knee. Donned pts velcro shoes after and she reports bandages comfortable by end of session.   PATIENT EDUCATION:  Education details: educated pt in anatomy and physiology of the lymphatic system and basic principals of self MLD, compression pump, availability of compression stockings and velcro compression garments Person educated: Patient Education method: Customer service manager Education comprehension: verbalized understanding   HOME EXERCISE PROGRAM: Walk with compression bandages intact once we begin bandaging at next appointment  ASSESSMENT:  CLINICAL IMPRESSION: Pt has been wearing her 15-20 mmHg socks and reports they are working well. She wanted to continue to review self massage today. Therapist did massage and reviewed correct technique on L side then pt demonstrate briefly correct sequence and technique on R side. Pt decided she does not want to pursue to FlexiTouch due to cost and difficulty of donning. Sent a message to [employer] to let them know.   OBJECTIVE  IMPAIRMENTS decreased knowledge of condition, decreased knowledge of use of DME, and increased edema.   ACTIVITY LIMITATIONS  none  PARTICIPATION LIMITATIONS:  none  PERSONAL FACTORS  none  are also affecting patient's functional outcome.   REHAB POTENTIAL: Excellent  CLINICAL DECISION MAKING: Stable/uncomplicated  EVALUATION COMPLEXITY: Low  GOALS: Goals reviewed with patient? Yes  SHORT TERM GOALS=LONG TERM GOALS Target date: 12/24/21  Pt will obtain appropriate compression garments for long term management of lymphedema.  Baseline: Goal status: INITIAL  2.  Pt will be independent in self MLD for long term management of lymphedema.  Baseline:  Goal status: INITIAL  3.  Pt will receive a trial of the FlexiTouch compression pump for long term management of lymphedema.  Baseline:  Goal status: INITIAL  4.  Pt will demonstrate a 2 cm decrease of edema at 30 cm superior to medial malleoli on L LE to decrease risk of infection.  Baseline:  Goal status: INITIAL PLAN: PT FREQUENCY: 3x/week  PT DURATION: 6 weeks  PLANNED INTERVENTIONS: Therapeutic exercises, Patient/Family education, Self Care, Orthotic/Fit training, DME instructions, Manual lymph drainage, Compression bandaging, Vasopneumatic device, and Manual therapy  PLAN FOR NEXT SESSION: How is itchiness of legs, cont to instruct in self MLD,   (On Left side to Inguinal only due to Left ALND) - decrease visits?   [employer], PT 11/27/2021, 10:05 AM

## 2021-11-29 ENCOUNTER — Ambulatory Visit: Payer: Medicare HMO | Admitting: Physical Therapy

## 2021-12-04 ENCOUNTER — Ambulatory Visit: Payer: Medicare HMO | Attending: Family Medicine

## 2021-12-04 DIAGNOSIS — M25511 Pain in right shoulder: Secondary | ICD-10-CM | POA: Insufficient documentation

## 2021-12-04 DIAGNOSIS — G8929 Other chronic pain: Secondary | ICD-10-CM | POA: Diagnosis not present

## 2021-12-04 DIAGNOSIS — Z8542 Personal history of malignant neoplasm of other parts of uterus: Secondary | ICD-10-CM | POA: Diagnosis not present

## 2021-12-04 DIAGNOSIS — M5136 Other intervertebral disc degeneration, lumbar region: Secondary | ICD-10-CM | POA: Diagnosis not present

## 2021-12-04 DIAGNOSIS — N3942 Incontinence without sensory awareness: Secondary | ICD-10-CM | POA: Diagnosis not present

## 2021-12-04 DIAGNOSIS — I890 Lymphedema, not elsewhere classified: Secondary | ICD-10-CM | POA: Diagnosis not present

## 2021-12-04 DIAGNOSIS — M6281 Muscle weakness (generalized): Secondary | ICD-10-CM | POA: Diagnosis not present

## 2021-12-04 DIAGNOSIS — R293 Abnormal posture: Secondary | ICD-10-CM | POA: Insufficient documentation

## 2021-12-04 DIAGNOSIS — R278 Other lack of coordination: Secondary | ICD-10-CM | POA: Diagnosis not present

## 2021-12-04 DIAGNOSIS — R159 Full incontinence of feces: Secondary | ICD-10-CM | POA: Diagnosis not present

## 2021-12-04 DIAGNOSIS — R262 Difficulty in walking, not elsewhere classified: Secondary | ICD-10-CM | POA: Insufficient documentation

## 2021-12-04 NOTE — Therapy (Signed)
OUTPATIENT PHYSICAL THERAPY ONCOLOGY TREATMENT  Patient Name: Beth Simon MRN: [id number] DOB:[birth year], 81 y.o., female Today's Date: 12/04/2021   PT End of Session - 12/04/21 0809     Visit Number 9    Number of Visits 13    Date for PT Re-Evaluation 12/24/21    PT Start Time 0805   pt arrived late   PT Stop Time 0905    PT Time Calculation (min) 60 min    Activity Tolerance Patient tolerated treatment well    Behavior During Therapy [facility] for tasks assessed/performed             Past Medical History:  Diagnosis Date   Adenomatous polyps 01/2016   Anemia    Breast cancer ([place]) 1990   bilateral mastectomy, adenoca breast-left MRM, reconstruction, chemo   Bronchitis last 2 weeks   saw dr Felipa Eth 02-28-2014, he said no antibiotics needed, nonproductive cough   Complication of anesthesia    Cystitis    cytoxen, had once or twice   Family history of breast cancer    History of breast cancer    History of radiation therapy 2/10, 2/12, 2/18, 2/24, 05/29/14   vaginal cuff/ 30 Gy/ 5 fx   Hypertension    Macular degeneration    Neck pain    taking physictal therapy last 2 weeks   Osteoporosis    On Prolia.   PONV (postoperative nausea and vomiting)    Shoulder pain    taking physical therapy for last few weeks   Uterine cancer ([place]) 03/21/2014   MLH1/PMS2 LOH   Uterine fibroid    VAIN I (vaginal intraepithelial neoplasia grade I) 2018   positive HR HPV.   Vasovagal syncopes    Past Surgical History:  Procedure Laterality Date   BREAST IMPLANT REMOVAL  6/94   left breast   BREAST RECONSTRUCTION  1990   left   CATARACT EXTRACTION Bilateral    EXCISION VAGINAL CYST     MASTECTOMY Right 10/08/06   prophylactic   RADICAL MASTECTOMY LND  1990   left, chemo done   ROBOTIC ASSISTED TOTAL HYSTERECTOMY WITH BILATERAL SALPINGO OOPHERECTOMY Bilateral 03/21/2014   Procedure: ROBOTIC ASSISTED TOTAL HYSTERECTOMY WITH BILATERAL SALPINGO OOPHORECTOMY ;  Surgeon: Janie Morning, MD;  Location: [facility];  Service: Gynecology;  Laterality: Bilateral;   Patient Active Problem List   Diagnosis Date Noted   Age-related osteoporosis without current pathological fracture 03/19/2020   Personal history of colonic polyps 03/19/2020   Transient cerebral ischemia 09/15/2017   Pure hypercholesterolemia 09/15/2017   Pulmonary nodule 09/15/2017   Hypertension 09/15/2017   Gastro-esophageal reflux disease without esophagitis 09/15/2017   VAIN I (vaginal intraepithelial neoplasia grade I) 06/23/2017   Genetic testing 05/23/2014   History of breast cancer    Family history of breast cancer    Endometrial cancer ([place]) 03/21/2014   Uterine cancer ([place]) 03/21/2014   S/P mastectomy, bilateral 05/02/2013   Situational stress 05/02/2013   Osteoporosis, unspecified 05/02/2013   Breast cancer, left breast ([place]) 05/02/2013    PCP: Lajean Manes, MD  REFERRING PROVIDER: Linus Mako, MD   REFERRING DIAG: I89.0 (ICD-10-CM) - Lymphedema, not elsewhere classified   THERAPY DIAG:  Lymphedema, not elsewhere classified  History of uterine cancer  ONSET DATE: 05/01/21  Rationale for Evaluation and Treatment Rehabilitation  SUBJECTIVE  SUBJECTIVE STATEMENT: I was on my feet until about lunch yesterday running errands and it was so hot I had to take my garments off. So I woke up around 4 this morning with the veins at the superior aspect of my Lt calf hurting and my foot was itchy so bad. But I could tell my leg was more swollen at that point too.    PERTINENT HISTORY:  History notable for left breast cancer ER-PR-  treated with radical mastectomy and chemotherapy in 1990.  Subsequently underwent right simple mastectomy 2008 without reconstruction. Denies use of tamoxifen   On 03/21/14   she underwent a robotic hysterectomy for treatment of uterine cancer, BSO. Frozen section revealed grade 1 cancer. Final pathology revealed a grade 2 lesion with LVSI present and she was staged at stage IA. She was recommended vaginal brachytherapy as adjuvant therapy to reduce risk for local recurrence.  Dr Lanell Persons delivered vaginal brachytherapy completed on 05/29/14. Her treatments were without complication. She has been using her vaginal dilator since.  PAIN:  Are you having pain? No  PRECAUTIONS: Other: bilateral LE lymphedema  WEIGHT BEARING RESTRICTIONS No  FALLS:  Has patient fallen in last 6 months? No  LIVING ENVIRONMENT: Lives with: lives alone Lives in: House/apartment Stairs: No;  Has following equipment at home: None  OCCUPATION: Retried  LEISURE: walks a couple of miles on Tuesday morning, on Thursday goes to [employer]iliates with [employer]   HAND DOMINANCE : right   PRIOR LEVEL OF FUNCTION: Independent  PATIENT GOALS to have a tool in my toolkit to relieve the swelling and redistribute the lymphatic fluid   OBJECTIVE  COGNITION:  Overall cognitive status: Within functional limits for tasks assessed   PALPATION: Pitting edema at bilateral ankles  OBSERVATIONS / OTHER ASSESSMENTS: LLE more swollen than RLE  POSTURE: forward head and rounded shoulders LYMPHEDEMA ASSESSMENTS:   SURGERY TYPE/DATE: uterine cancer with total hysterectomy in 2016, 1990 L mastectomy and ALND, 2008 R mastectomy  NUMBER OF LYMPH NODES REMOVED: none  CHEMOTHERAPY: completed for L breast cancer  RADIATION:completed  HORMONE TREATMENT: none  INFECTIONS: none  LYMPHEDEMA ASSESSMENTS:   LOWER EXTREMITY LANDMARK RIGHT eval RIGHT 11/18/21 RIGHT 11/22/21 11/25/2021  At groin      30 cm proximal to suprapatella      20 cm proximal to suprapatella 50.2 49    10 cm proximal to suprapatella 42 41.2    At midpatella / popliteal crease 38.6 38    30 cm proximal to floor at lateral  plantar foot 38.6 37.7 37.5 37  20 cm proximal to floor at lateral plantar foot 30.4 30.$RemoveBefo'1 30 29  10 'wbWqTLnYyAs$ cm proximal to floor at lateral plantar foot 24.5 23 23.5 22.7  Circumference of ankle/heel      5 cm proximal to 1st MTP joint 20.5 20 20.5 20.7  Across MTP joint 21 20.7 21.5 20.7  Around proximal great toe 7.5 7.5 7.3 7.5  (Blank rows = not tested)  LOWER EXTREMITY LANDMARK LEFT eval LEFT 11/18/21 11/22/2021 11/25/2021  At groin      30 cm proximal to suprapatella      20 cm proximal to suprapatella 51.3 51    10 cm proximal to suprapatella 43.5 42.5    At midpatella / popliteal crease 39 38.5    30 cm proximal to floor at lateral plantar foot 39.5  38.5 38 37.7  20 cm proximal to floor at lateral plantar foot 32.5 30.5 30.$RemoveBefo'3 30  10 'OcdqewKguTD$ cm proximal  to floor at lateral plantar foot 26.4 23.2 24.7 24.1  Circumference of ankle/heel      5 cm proximal to 1st MTP joint 21 20.7 20.7 21.0  Across MTP joint 21.4 20.9 21 20.9  Around proximal great toe 7.6 7.4 7.5 7.5  (Blank rows = not tested)   LLIS:   22.06  TODAY'S TREATMENT  12/04/21: Manual Therapy: In supine: Short neck, 5 diaphragmatic breaths, Lt inguinal nodes, (no anastomosis due to Lt ALND in 1990); and then Lt LE working from proximal to distal, then retracing all steps (with intact sequence) reviewing with pt while performing importance of trying to incorporate this daily, but especially on days like yesterday when she is on her feet more and takes her compression socks off. Then on Rt side: Rt axillary nodes, Rt inguino-axillary anastomosis, then Rt LE working non intact sequence from proximal to distal then retracing all steps. At end of session therapist assisted pt with donning her compression socks.  11/27/21 In supine: 5 diaphragmatic breaths, Lt inguinal nodes, (no anastomosis due to Lt ALND in 1990); and then Lt LE working from proximal to distal, then retracing all steps (with intact sequence) and will continue intact for  remaining sessions for Lt LE) educated pt throughout on correct sequence and skin stretch. At the end pt demonstrated LN activation and right inguinal-axillary pathway as well as correct sequence for the rest of the RLE. Pt was able to independently don her compression sock at end of session (therapist did one and pt donned one)  11/25/2021 Measured pts legs with good results noted. Left leg still larger than right. Pt requested that I perform MLD on her today so she can feel it, and she preferred to do lying down rather than sitting up.  In supine with legs elevated: 5 diaphragmatic breaths, Lt inguinal nodes, (no anastomosis due to Lt ALND in 1990); and then Lt LE working from proximal to distal, then retracing all steps (with intact sequence) and will continue intact for remaining sessions for Lt LE) then on R side performed non intact sequence since axillary lymph nodes are intact on this side and directed to axillary LN's.  Therapist demonstrated what it feels like from therapist position, and then how it would feel if she did it on herself and answered pt questions. She demonstrated LN activation and right inguinal-axillary pathway with good technique after review     PATIENT EDUCATION:  Education details: educated pt in anatomy and physiology of the lymphatic system and basic principals of self MLD, compression pump, availability of compression stockings and velcro compression garments Person educated: Patient Education method: Customer service manager Education comprehension: verbalized understanding   HOME EXERCISE PROGRAM: Walk with compression bandages intact once we begin bandaging at next appointment  ASSESSMENT:  CLINICAL IMPRESSION: Pt is continuing to wear her 15-20 mmHg socks though yesterday she reports taking them off in the early afternoon due to it being so hot yesterday so she did have some increased swelling that woke her up early this morning. Reminded pt that when  she has days that she needs to remove her compression socks earlier than usual to be mindful to incorporate self MLD. Discussed if pt would like to decr freq to 1x/wk but she would like to cont 2x/week as she is traveling out of the country to [place] in a few weeks and she would like more review of self MLD until she leaves.   OBJECTIVE IMPAIRMENTS decreased knowledge of condition, decreased knowledge  of use of DME, and increased edema.   ACTIVITY LIMITATIONS  none  PARTICIPATION LIMITATIONS:  none  PERSONAL FACTORS  none  are also affecting patient's functional outcome.   REHAB POTENTIAL: Excellent  CLINICAL DECISION MAKING: Stable/uncomplicated  EVALUATION COMPLEXITY: Low  GOALS: Goals reviewed with patient? Yes  SHORT TERM GOALS=LONG TERM GOALS Target date: 12/24/21  Pt will obtain appropriate compression garments for long term management of lymphedema.  Baseline: Goal status: INITIAL  2.  Pt will be independent in self MLD for long term management of lymphedema.  Baseline:  Goal status: INITIAL  3.  Pt will receive a trial of the FlexiTouch compression pump for long term management of lymphedema.  Baseline:  Goal status: INITIAL  4.  Pt will demonstrate a 2 cm decrease of edema at 30 cm superior to medial malleoli on L LE to decrease risk of infection.  Baseline:  Goal status: INITIAL PLAN: PT FREQUENCY: 3x/week  PT DURATION: 6 weeks  PLANNED INTERVENTIONS: Therapeutic exercises, Patient/Family education, Self Care, Orthotic/Fit training, DME instructions, Manual lymph drainage, Compression bandaging, Vasopneumatic device, and Manual therapy  PLAN FOR NEXT SESSION: How is itchiness of legs, cont to instruct in self MLD and encourage pt to perform this at least 1x/day,   (On Left side to Inguinal only due to Left ALND)   Otelia Limes, PTA 12/04/2021, 9:12 AM

## 2021-12-06 ENCOUNTER — Ambulatory Visit: Payer: Medicare HMO

## 2021-12-06 DIAGNOSIS — M6281 Muscle weakness (generalized): Secondary | ICD-10-CM | POA: Diagnosis not present

## 2021-12-06 DIAGNOSIS — R278 Other lack of coordination: Secondary | ICD-10-CM | POA: Diagnosis not present

## 2021-12-06 DIAGNOSIS — M25511 Pain in right shoulder: Secondary | ICD-10-CM | POA: Diagnosis not present

## 2021-12-06 DIAGNOSIS — M5136 Other intervertebral disc degeneration, lumbar region: Secondary | ICD-10-CM | POA: Diagnosis not present

## 2021-12-06 DIAGNOSIS — I890 Lymphedema, not elsewhere classified: Secondary | ICD-10-CM | POA: Diagnosis not present

## 2021-12-06 DIAGNOSIS — R262 Difficulty in walking, not elsewhere classified: Secondary | ICD-10-CM | POA: Diagnosis not present

## 2021-12-06 DIAGNOSIS — Z8542 Personal history of malignant neoplasm of other parts of uterus: Secondary | ICD-10-CM

## 2021-12-06 DIAGNOSIS — R159 Full incontinence of feces: Secondary | ICD-10-CM | POA: Diagnosis not present

## 2021-12-06 DIAGNOSIS — G8929 Other chronic pain: Secondary | ICD-10-CM | POA: Diagnosis not present

## 2021-12-06 DIAGNOSIS — R293 Abnormal posture: Secondary | ICD-10-CM | POA: Diagnosis not present

## 2021-12-06 DIAGNOSIS — N3942 Incontinence without sensory awareness: Secondary | ICD-10-CM | POA: Diagnosis not present

## 2021-12-06 NOTE — Therapy (Signed)
OUTPATIENT PHYSICAL THERAPY ONCOLOGY TREATMENT Progress Note Reporting Period 11/12/2021 to 12/24/2021  See note below for Objective Data and Assessment of Progress/Goals.     Patient Name: Beth Simon MRN: [id number] DOB:09/19/1940, 81 y.o., female Today's Date: 12/06/2021   PT End of Session - 12/06/21 0857     Visit Number 10    Number of Visits 13    Date for PT Re-Evaluation 12/24/21    PT Start Time 0859    PT Stop Time 0956    PT Time Calculation (min) 57 min    Activity Tolerance Patient tolerated treatment well    Behavior During Therapy [facility] for tasks assessed/performed             Past Medical History:  Diagnosis Date   Adenomatous polyps 01/2016   Anemia    Breast cancer ([place]) 1990   bilateral mastectomy, adenoca breast-left MRM, reconstruction, chemo   Bronchitis last 2 weeks   saw dr Felipa Eth 02-28-2014, he said no antibiotics needed, nonproductive cough   Complication of anesthesia    Cystitis    cytoxen, had once or twice   Family history of breast cancer    History of breast cancer    History of radiation therapy 2/10, 2/12, 2/18, 2/24, 05/29/14   vaginal cuff/ 30 Gy/ 5 fx   Hypertension    Macular degeneration    Neck pain    taking physictal therapy last 2 weeks   Osteoporosis    On Prolia.   PONV (postoperative nausea and vomiting)    Shoulder pain    taking physical therapy for last few weeks   Uterine cancer ([place]) 03/21/2014   MLH1/PMS2 LOH   Uterine fibroid    VAIN I (vaginal intraepithelial neoplasia grade I) 2018   positive HR HPV.   Vasovagal syncopes    Past Surgical History:  Procedure Laterality Date   BREAST IMPLANT REMOVAL  6/94   left breast   BREAST RECONSTRUCTION  1990   left   CATARACT EXTRACTION Bilateral    EXCISION VAGINAL CYST     MASTECTOMY Right 10/08/06   prophylactic   RADICAL MASTECTOMY LND  1990   left, chemo done   ROBOTIC ASSISTED TOTAL HYSTERECTOMY WITH BILATERAL SALPINGO OOPHERECTOMY Bilateral  03/21/2014   Procedure: ROBOTIC ASSISTED TOTAL HYSTERECTOMY WITH BILATERAL SALPINGO OOPHORECTOMY ;  Surgeon: Janie Morning, MD;  Location: [facility];  Service: Gynecology;  Laterality: Bilateral;   Patient Active Problem List   Diagnosis Date Noted   Age-related osteoporosis without current pathological fracture 03/19/2020   Personal history of colonic polyps 03/19/2020   Transient cerebral ischemia 09/15/2017   Pure hypercholesterolemia 09/15/2017   Pulmonary nodule 09/15/2017   Hypertension 09/15/2017   Gastro-esophageal reflux disease without esophagitis 09/15/2017   VAIN I (vaginal intraepithelial neoplasia grade I) 06/23/2017   Genetic testing 05/23/2014   History of breast cancer    Family history of breast cancer    Endometrial cancer ([place]) 03/21/2014   Uterine cancer ([place]) 03/21/2014   S/P mastectomy, bilateral 05/02/2013   Situational stress 05/02/2013   Osteoporosis, unspecified 05/02/2013   Breast cancer, left breast ([place]) 05/02/2013    PCP: Lajean Manes, MD  REFERRING PROVIDER: Linus Mako, MD   REFERRING DIAG: I89.0 (ICD-10-CM) - Lymphedema, not elsewhere classified   THERAPY DIAG:  Lymphedema, not elsewhere classified  History of uterine cancer  ONSET DATE: 05/01/21  Rationale for Evaluation and Treatment Rehabilitation  SUBJECTIVE  SUBJECTIVE STATEMENT: I still get itchy on my legs/ankle especially when I wear the heavy socks. When I took the socks off the other day my legs got more swollen, but they usually come down overnight.  PERTINENT HISTORY:  History notable for left breast cancer ER-PR-  treated with radical mastectomy and chemotherapy in 1990.  Subsequently underwent right simple mastectomy 2008 without reconstruction. Denies use of tamoxifen   On 03/21/14  she  underwent a robotic hysterectomy for treatment of uterine cancer, BSO. Frozen section revealed grade 1 cancer. Final pathology revealed a grade 2 lesion with LVSI present and she was staged at stage IA. She was recommended vaginal brachytherapy as adjuvant therapy to reduce risk for local recurrence.  Dr Lanell Persons delivered vaginal brachytherapy completed on 05/29/14. Her treatments were without complication. She has been using her vaginal dilator since.  PAIN:  Are you having pain? No  PRECAUTIONS: Other: bilateral LE lymphedema  WEIGHT BEARING RESTRICTIONS No  FALLS:  Has patient fallen in last 6 months? No  LIVING ENVIRONMENT: Lives with: lives alone Lives in: House/apartment Stairs: No;  Has following equipment at home: None  OCCUPATION: Retried  LEISURE: walks a couple of miles on Tuesday morning, on Thursday goes to [place]iliates with [employer]   HAND DOMINANCE : right   PRIOR LEVEL OF FUNCTION: Independent  PATIENT GOALS to have a tool in my toolkit to relieve the swelling and redistribute the lymphatic fluid   OBJECTIVE  COGNITION:  Overall cognitive status: Within functional limits for tasks assessed   PALPATION: Pitting edema at bilateral ankles  OBSERVATIONS / OTHER ASSESSMENTS: LLE more swollen than RLE  POSTURE: forward head and rounded shoulders LYMPHEDEMA ASSESSMENTS:   SURGERY TYPE/DATE: uterine cancer with total hysterectomy in 2016, 1990 L mastectomy and ALND, 2008 R mastectomy  NUMBER OF LYMPH NODES REMOVED: none  CHEMOTHERAPY: completed for L breast cancer  RADIATION:completed  HORMONE TREATMENT: none  INFECTIONS: none  LYMPHEDEMA ASSESSMENTS:   LOWER EXTREMITY LANDMARK RIGHT eval RIGHT 11/18/21 RIGHT 11/22/21 11/25/2021 12/06/2021  At groin       30 cm proximal to suprapatella       20 cm proximal to suprapatella 50.2 49     10 cm proximal to suprapatella 42 41.2     At midpatella / popliteal crease 38.6 38     30 cm proximal to floor at  lateral plantar foot 38.6 37.7 37.5 37 36.8  20 cm proximal to floor at lateral plantar foot 30.4 30._0 29.2  10 cm proximal to floor at lateral plantar foot 24.5 23 23.5 22.7 22.8  Circumference of ankle/heel       5 cm proximal to 1st MTP joint 20.5 20 20.5 20.7 20.5  Across MTP joint 21 20.7 21.5 20.7 205  Around proximal great toe 7.5 7.5 7.3 7.5 7.4  (Blank rows = not tested)  LOWER EXTREMITY LANDMARK LEFT eval LEFT 11/18/21 11/22/2021 11/25/2021 12/06/2021  At groin       30 cm proximal to suprapatella       20 cm proximal to suprapatella 51.3 51     10 cm proximal to suprapatella 43.5 42.5     At midpatella / popliteal crease 39 38.5     30 cm proximal to floor at lateral plantar foot 39.5  38.5 38 37.7 37.7  20 cm proximal to floor at lateral plantar foot 32.5 30.5 30.3 30 30.3  10 cm proximal to floor at lateral plantar foot 26.4 23.2 24.7 24.1  24.0  Circumference of ankle/heel       5 cm proximal to 1st MTP joint 21 20.7 20.7 21.0 20.7  Across MTP joint 21.4 20.9 21 20.9 20.7  Around proximal great toe 7.6 7.4 7.5 7.5 7.6  (Blank rows = not tested)   LLIS:   22.06  TODAY'S TREATMENT   12/06/2021 Pt remeasured with fairly stable results. n supine: Short neck, 5 diaphragmatic breaths, Lt inguinal nodes, (no anastomosis due to Lt ALND in 1990); and then Lt LE working from proximal to distal, then retracing all steps (with intact sequence) reviewing with pt while performingThen on Rt side:  Pt performed Rt axillary nodes, Rt inguino-axillary anastomosis, then Rt LE working non intact sequence from proximal to distal then retracing all steps. At end of session therapist assisted pt with donning her compression socks.    12/04/21: Manual Therapy: In supine: Short neck, 5 diaphragmatic breaths, Lt inguinal nodes, (no anastomosis due to Lt ALND in 1990); and then Lt LE working from proximal to distal, then retracing all steps (with intact sequence) reviewing with pt while  performing importance of trying to incorporate this daily, but especially on days like yesterday when she is on her feet more and takes her compression socks off. Then on Rt side: Rt axillary nodes, Rt inguino-axillary anastomosis, then Rt LE working non intact sequence from proximal to distal then retracing all steps. At end of session therapist assisted pt with donning her compression socks.  11/27/21 In supine: 5 diaphragmatic breaths, Lt inguinal nodes, (no anastomosis due to Lt ALND in 1990); and then Lt LE working from proximal to distal, then retracing all steps (with intact sequence) and will continue intact for remaining sessions for Lt LE) educated pt throughout on correct sequence and skin stretch. At the end pt demonstrated LN activation and right inguinal-axillary pathway as well as correct sequence for the rest of the RLE. Pt was able to independently don her compression sock at end of session (therapist did one and pt donned one)  11/25/2021 Measured pts legs with good results noted. Left leg still larger than right. Pt requested that I perform MLD on her today so she can feel it, and she preferred to do lying down rather than sitting up.  In supine with legs elevated: 5 diaphragmatic breaths, Lt inguinal nodes, (no anastomosis due to Lt ALND in 1990); and then Lt LE working from proximal to distal, then retracing all steps (with intact sequence) and will continue intact for remaining sessions for Lt LE) then on R side performed non intact sequence since axillary lymph nodes are intact on this side and directed to axillary LN's.  Therapist demonstrated what it feels like from therapist position, and then how it would feel if she did it on herself and answered pt questions. She demonstrated LN activation and right inguinal-axillary pathway with good technique after review     PATIENT EDUCATION:  Education details: educated pt in anatomy and physiology of the lymphatic system and basic  principals of self MLD, compression pump, availability of compression stockings and velcro compression garments Person educated: Patient Education method: Customer service manager Education comprehension: verbalized understanding   HOME EXERCISE PROGRAM: Walk with compression bandages intact once we begin bandaging at next appointment  ASSESSMENT:  CLINICAL IMPRESSION: Pt  was remeasured today with stable results. Left leg still slightly larger than right. Therapist performed MLD to the left LE with education to pt, and then pt demonstrated self MLD on the right  LE. She did well overall, but did require some tactile and VC's. She will benefit from further review. She has achieved 2 goals established and is progressing with the remaining 2 goals.    OBJECTIVE IMPAIRMENTS decreased knowledge of condition, decreased knowledge of use of DME, and increased edema.   ACTIVITY LIMITATIONS  none  PARTICIPATION LIMITATIONS:  none  PERSONAL FACTORS  none  are also affecting patient's functional outcome.   REHAB POTENTIAL: Excellent  CLINICAL DECISION MAKING: Stable/uncomplicated  EVALUATION COMPLEXITY: Low  GOALS: Goals reviewed with patient? Yes  SHORT TERM GOALS=LONG TERM GOALS Target date: 12/24/21  Pt will obtain appropriate compression garments for long term management of lymphedema.  Baseline: Goal status: MET 2.  Pt will be independent in self MLD for long term management of lymphedema.  Baseline:  Goal status:  3.  Pt will receive a trial of the FlexiTouch compression pump for long term management of lymphedema.  Baseline:  Goal status: MET, but does not wish to have  4.  Pt will demonstrate a 2 cm decrease of edema at 30 cm superior to medial malleoli on L LE to decrease risk of infection.  Baseline:  Goal status: Progressing PLAN: PT FREQUENCY: 3x/week  PT DURATION: 6 weeks  PLANNED INTERVENTIONS: Therapeutic exercises, Patient/Family education, Self Care,  Orthotic/Fit training, DME instructions, Manual lymph drainage, Compression bandaging, Vasopneumatic device, and Manual therapy  PLAN FOR NEXT SESSION: How is itchiness of legs, cont to instruct in self MLD and encourage pt to perform this at least 1x/day,   (On Left side to Inguinal only due to Left ALND)   Claris Pong, PT 12/06/2021, 12:40 PM

## 2021-12-08 NOTE — Therapy (Unsigned)
OUTPATIENT PHYSICAL THERAPY TREATMENT NOTE   Patient Name: Beth Simon MRN: [id number] DOB:05/05/1940, 81 y.o., female Today's Date: 12/08/2021  PCP: Lajean Manes, MD   REFERRING PROVIDER: Linus Mako, MD    REFERRING DIAG: I89.0 (ICD-10-CM) - Lymphedema, not elsewhere classified   History of uterine cancer   ONSET DATE: 05/01/21   Rationale for Evaluation and Treatment Rehabilitation    THERAPY DIAG:  Lymphedema, not elsewhere classified  History of uterine cancer  Abnormal posture  Muscle weakness (generalized)  Lumbar degenerative disc disease  Difficulty in walking, not elsewhere classified  Chronic right shoulder pain  Other lack of coordination  Incontinence of feces, unspecified fecal incontinence type  Urinary incontinence without sensory awareness  Rationale for Evaluation and Treatment Rehabilitation  SUBJECTIVE STATEMENT: ***   PERTINENT HISTORY:  History notable for left breast cancer ER-PR-  treated with radical mastectomy and chemotherapy in 1990.  Subsequently underwent right simple mastectomy 2008 without reconstruction. Denies use of tamoxifen   On 03/21/14  she underwent a robotic hysterectomy for treatment of uterine cancer, BSO. Frozen section revealed grade 1 cancer. Final pathology revealed a grade 2 lesion with LVSI present and she was staged at stage IA. She was recommended vaginal brachytherapy as adjuvant therapy to reduce risk for local recurrence.  Dr Lanell Persons delivered vaginal brachytherapy completed on 05/29/14. Her treatments were without complication. She has been using her vaginal dilator since.  PAIN:  Are you having pain? No   PRECAUTIONS: Other: bilateral LE lymphedema   WEIGHT BEARING RESTRICTIONS No   FALLS:  Has patient fallen in last 6 months? No   LIVING ENVIRONMENT: Lives with: lives alone Lives in: House/apartment Stairs: No;  Has following equipment at home: None   OCCUPATION: Retried   LEISURE:  walks a couple of miles on Tuesday morning, on Thursday goes to piliates with [employer]    HAND DOMINANCE : right    PRIOR LEVEL OF FUNCTION: Independent   PATIENT GOALS to have a tool in my toolkit to relieve the swelling and redistribute the lymphatic fluid     OBJECTIVE   COGNITION:            Overall cognitive status: Within functional limits for tasks assessed    PALPATION: Pitting edema at bilateral ankles   OBSERVATIONS / OTHER ASSESSMENTS: LLE more swollen than RLE   POSTURE: forward head and rounded shoulders LYMPHEDEMA ASSESSMENTS:    SURGERY TYPE/DATE: uterine cancer with total hysterectomy in 2016, 1990 L mastectomy and ALND, 2008 R mastectomy   NUMBER OF LYMPH NODES REMOVED: none   CHEMOTHERAPY: completed for L breast cancer   RADIATION:completed   HORMONE TREATMENT: none   INFECTIONS: none   LYMPHEDEMA ASSESSMENTS:    LOWER EXTREMITY LANDMARK RIGHT eval RIGHT 11/18/21 RIGHT 11/22/21 11/25/2021 12/06/2021  At groin            30 cm proximal to suprapatella            20 cm proximal to suprapatella 50.2 49        10 cm proximal to suprapatella 42 41.2        At midpatella / popliteal crease 38.6 38        30 cm proximal to floor at lateral plantar foot 38.6 37.7 37.5 37 36.8  20 cm proximal to floor at lateral plantar foot 30.4 30._0 29.2  10 cm proximal to floor at lateral plantar foot 24.5 23 23.5 22.7 22.8  Circumference of ankle/heel  5 cm proximal to 1st MTP joint 20.5 20 20.5 20.7 20.5  Across MTP joint 21 20.7 21.5 20.7 205  Around proximal great toe 7.5 7.5 7.3 7.5 7.4  (Blank rows = not tested)   LOWER EXTREMITY LANDMARK LEFT eval LEFT 11/18/21 11/22/2021 11/25/2021 12/06/2021  At groin            30 cm proximal to suprapatella            20 cm proximal to suprapatella 51.3 51        10 cm proximal to suprapatella 43.5 42.5        At midpatella / popliteal crease 39 38.5        30 cm proximal to floor at lateral plantar foot  39.5  38.5 38 37.7 37.7  20 cm proximal to floor at lateral plantar foot 32.5 30.5 30.3 30 30.3  10 cm proximal to floor at lateral plantar foot 26.4 23.2 24.7 24.1 24.0  Circumference of ankle/heel            5 cm proximal to 1st MTP joint 21 20.7 20.7 21.0 20.7  Across MTP joint 21.4 20.9 21 20.9 20.7  Around proximal great toe 7.6 7.4 7.5 7.5 7.6  (Blank rows = not tested)     LLIS:                         22.06   TODAY'S TREATMENT   12/09/2021 ***   12/06/2021 Pt remeasured with fairly stable results. n supine: Short neck, 5 diaphragmatic breaths, Lt inguinal nodes, (no anastomosis due to Lt ALND in 1990); and then Lt LE working from proximal to distal, then retracing all steps (with intact sequence) reviewing with pt while performingThen on Rt side:  Pt performed Rt axillary nodes, Rt inguino-axillary anastomosis, then Rt LE working non intact sequence from proximal to distal then retracing all steps. At end of session therapist assisted pt with donning her compression socks.     12/04/21: Manual Therapy: In supine: Short neck, 5 diaphragmatic breaths, Lt inguinal nodes, (no anastomosis due to Lt ALND in 1990); and then Lt LE working from proximal to distal, then retracing all steps (with intact sequence) reviewing with pt while performing importance of trying to incorporate this daily, but especially on days like yesterday when she is on her feet more and takes her compression socks off. Then on Rt side: Rt axillary nodes, Rt inguino-axillary anastomosis, then Rt LE working non intact sequence from proximal to distal then retracing all steps. At end of session therapist assisted pt with donning her compression socks.     PATIENT EDUCATION:  Education details: educated pt in anatomy and physiology of the lymphatic system and basic principals of self MLD, compression pump, availability of compression stockings and velcro compression garments Person educated: Patient Education method:  Customer service manager Education comprehension: verbalized understanding     HOME EXERCISE PROGRAM: Walk with compression bandages intact once we begin bandaging at next appointment   ASSESSMENT:   CLINICAL IMPRESSION:   OBJECTIVE IMPAIRMENTS decreased knowledge of condition, decreased knowledge of use of DME, and increased edema.    ACTIVITY LIMITATIONS  none   PARTICIPATION LIMITATIONS:  none   PERSONAL FACTORS  none are also affecting patient's functional outcome.    REHAB POTENTIAL: Excellent   CLINICAL DECISION MAKING: Stable/uncomplicated   EVALUATION COMPLEXITY: Low   GOALS: Goals reviewed with patient? Yes   SHORT TERM GOALS=LONG TERM  GOALS Target date: 12/24/21   Pt will obtain appropriate compression garments for long term management of lymphedema.  Baseline: Goal status: MET 2.  Pt will be independent in self MLD for long term management of lymphedema.  Baseline:  Goal status:  3.  Pt will receive a trial of the FlexiTouch compression pump for long term management of lymphedema.  Baseline:  Goal status: MET, but does not wish to have   4.  Pt will demonstrate a 2 cm decrease of edema at 30 cm superior to medial malleoli on L LE to decrease risk of infection.  Baseline:  Goal status: Progressing PLAN: PT FREQUENCY: 3x/week   PT DURATION: 6 weeks   PLANNED INTERVENTIONS: Therapeutic exercises, Patient/Family education, Self Care, Orthotic/Fit training, DME instructions, Manual lymph drainage, Compression bandaging, Vasopneumatic device, and Manual therapy   PLAN FOR NEXT SESSION:     Saperstein,MARTI COOPER, PT 12/08/2021, 2:35 PM

## 2021-12-09 ENCOUNTER — Ambulatory Visit: Payer: Medicare HMO

## 2021-12-09 DIAGNOSIS — Z8542 Personal history of malignant neoplasm of other parts of uterus: Secondary | ICD-10-CM

## 2021-12-09 DIAGNOSIS — I890 Lymphedema, not elsewhere classified: Secondary | ICD-10-CM

## 2021-12-09 DIAGNOSIS — M25511 Pain in right shoulder: Secondary | ICD-10-CM | POA: Diagnosis not present

## 2021-12-09 DIAGNOSIS — N3942 Incontinence without sensory awareness: Secondary | ICD-10-CM | POA: Diagnosis not present

## 2021-12-09 DIAGNOSIS — R278 Other lack of coordination: Secondary | ICD-10-CM | POA: Diagnosis not present

## 2021-12-09 DIAGNOSIS — M6281 Muscle weakness (generalized): Secondary | ICD-10-CM | POA: Diagnosis not present

## 2021-12-09 DIAGNOSIS — R293 Abnormal posture: Secondary | ICD-10-CM

## 2021-12-09 DIAGNOSIS — G8929 Other chronic pain: Secondary | ICD-10-CM

## 2021-12-09 DIAGNOSIS — M5136 Other intervertebral disc degeneration, lumbar region: Secondary | ICD-10-CM | POA: Diagnosis not present

## 2021-12-09 DIAGNOSIS — R262 Difficulty in walking, not elsewhere classified: Secondary | ICD-10-CM | POA: Diagnosis not present

## 2021-12-09 DIAGNOSIS — R159 Full incontinence of feces: Secondary | ICD-10-CM | POA: Diagnosis not present

## 2021-12-09 NOTE — Therapy (Signed)
OUTPATIENT PHYSICAL THERAPY ONCOLOGY TREATMENT Progress Note Reporting Period 11/12/2021 to 12/24/2021  See note below for Objective Data and Assessment of Progress/Goals.     Patient Name: Beth Simon MRN: [id number] DOB:09-08-1940, 81 y.o., female Today's Date: 12/09/2021   PT End of Session - 12/09/21 1004     Visit Number 11    Number of Visits 13    Date for PT Re-Evaluation 12/24/21    PT Start Time 1005    PT Stop Time 1055    PT Time Calculation (min) 50 min    Activity Tolerance Patient tolerated treatment well    Behavior During Therapy [facility] for tasks assessed/performed             Past Medical History:  Diagnosis Date   Adenomatous polyps 01/2016   Anemia    Breast cancer ([place]) 1990   bilateral mastectomy, adenoca breast-left MRM, reconstruction, chemo   Bronchitis last 2 weeks   saw dr Felipa Eth 02-28-2014, he said no antibiotics needed, nonproductive cough   Complication of anesthesia    Cystitis    cytoxen, had once or twice   Family history of breast cancer    History of breast cancer    History of radiation therapy 2/10, 2/12, 2/18, 2/24, 05/29/14   vaginal cuff/ 30 Gy/ 5 fx   Hypertension    Macular degeneration    Neck pain    taking physictal therapy last 2 weeks   Osteoporosis    On Prolia.   PONV (postoperative nausea and vomiting)    Shoulder pain    taking physical therapy for last few weeks   Uterine cancer ([place]) 03/21/2014   MLH1/PMS2 LOH   Uterine fibroid    VAIN I (vaginal intraepithelial neoplasia grade I) 2018   positive HR HPV.   Vasovagal syncopes    Past Surgical History:  Procedure Laterality Date   BREAST IMPLANT REMOVAL  6/94   left breast   BREAST RECONSTRUCTION  1990   left   CATARACT EXTRACTION Bilateral    EXCISION VAGINAL CYST     MASTECTOMY Right 10/08/06   prophylactic   RADICAL MASTECTOMY LND  1990   left, chemo done   ROBOTIC ASSISTED TOTAL HYSTERECTOMY WITH BILATERAL SALPINGO OOPHERECTOMY Bilateral  03/21/2014   Procedure: ROBOTIC ASSISTED TOTAL HYSTERECTOMY WITH BILATERAL SALPINGO OOPHORECTOMY ;  Surgeon: Janie Morning, MD;  Location: [facility];  Service: Gynecology;  Laterality: Bilateral;   Patient Active Problem List   Diagnosis Date Noted   Age-related osteoporosis without current pathological fracture 03/19/2020   Personal history of colonic polyps 03/19/2020   Transient cerebral ischemia 09/15/2017   Pure hypercholesterolemia 09/15/2017   Pulmonary nodule 09/15/2017   Hypertension 09/15/2017   Gastro-esophageal reflux disease without esophagitis 09/15/2017   VAIN I (vaginal intraepithelial neoplasia grade I) 06/23/2017   Genetic testing 05/23/2014   History of breast cancer    Family history of breast cancer    Endometrial cancer ([place]) 03/21/2014   Uterine cancer ([place]) 03/21/2014   S/P mastectomy, bilateral 05/02/2013   Situational stress 05/02/2013   Osteoporosis, unspecified 05/02/2013   Breast cancer, left breast ([place]) 05/02/2013    PCP: Lajean Manes, MD  REFERRING PROVIDER: Linus Mako, MD   REFERRING DIAG: I89.0 (ICD-10-CM) - Lymphedema, not elsewhere classified   THERAPY DIAG:  Abnormal posture  Muscle weakness (generalized)  Lumbar degenerative disc disease  Difficulty in walking, not elsewhere classified  Chronic right shoulder pain  Other lack of coordination  Incontinence of feces, unspecified fecal  incontinence type  Urinary incontinence without sensory awareness  ONSET DATE: 05/01/21  Rationale for Evaluation and Treatment Rehabilitation  SUBJECTIVE                                                                                                                                                                                           SUBJECTIVE STATEMENT: Still have the itchiness and bumpy around the ankles and my left calf.  I have been using aquaphor. In the morning my legs look really good, in the evening they are a little more  swollen, but better than they use to look.   PERTINENT HISTORY:  History notable for left breast cancer ER-PR-  treated with radical mastectomy and chemotherapy in 1990.  Subsequently underwent right simple mastectomy 2008 without reconstruction. Denies use of tamoxifen   On 03/21/14  she underwent a robotic hysterectomy for treatment of uterine cancer, BSO. Frozen section revealed grade 1 cancer. Final pathology revealed a grade 2 lesion with LVSI present and she was staged at stage IA. She was recommended vaginal brachytherapy as adjuvant therapy to reduce risk for local recurrence.  Dr Lanell Persons delivered vaginal brachytherapy completed on 05/29/14. Her treatments were without complication. She has been using her vaginal dilator since.  PAIN:  Are you having pain? No  PRECAUTIONS: Other: bilateral LE lymphedema  WEIGHT BEARING RESTRICTIONS No  FALLS:  Has patient fallen in last 6 months? No  LIVING ENVIRONMENT: Lives with: lives alone Lives in: House/apartment Stairs: No;  Has following equipment at home: None  OCCUPATION: Retried  LEISURE: walks a couple of miles on Tuesday morning, on Thursday goes to piliates with [employer]   HAND DOMINANCE : right   PRIOR LEVEL OF FUNCTION: Independent  PATIENT GOALS to have a tool in my toolkit to relieve the swelling and redistribute the lymphatic fluid   OBJECTIVE  COGNITION:  Overall cognitive status: Within functional limits for tasks assessed   PALPATION: Pitting edema at bilateral ankles  OBSERVATIONS / OTHER ASSESSMENTS: LLE more swollen than RLE  POSTURE: forward head and rounded shoulders LYMPHEDEMA ASSESSMENTS:   SURGERY TYPE/DATE: uterine cancer with total hysterectomy in 2016, 1990 L mastectomy and ALND, 2008 R mastectomy  NUMBER OF LYMPH NODES REMOVED: none  CHEMOTHERAPY: completed for L breast cancer  RADIATION:completed  HORMONE TREATMENT: none  INFECTIONS: none  LYMPHEDEMA ASSESSMENTS:   LOWER  EXTREMITY LANDMARK RIGHT eval RIGHT 11/18/21 RIGHT 11/22/21 11/25/2021 12/06/2021  At groin       30 cm proximal to suprapatella       20 cm proximal to suprapatella 50.2 49     10 cm proximal to  suprapatella 42 41.2     At midpatella / popliteal crease 38.6 38     30 cm proximal to floor at lateral plantar foot 38.6 37.7 37.5 37 36.8  20 cm proximal to floor at lateral plantar foot 30.4 30._0 29.2  10 cm proximal to floor at lateral plantar foot 24.5 23 23.5 22.7 22.8  Circumference of ankle/heel       5 cm proximal to 1st MTP joint 20.5 20 20.5 20.7 20.5  Across MTP joint 21 20.7 21.5 20.7 205  Around proximal great toe 7.5 7.5 7.3 7.5 7.4  (Blank rows = not tested)  LOWER EXTREMITY LANDMARK LEFT eval LEFT 11/18/21 11/22/2021 11/25/2021 12/06/2021  At groin       30 cm proximal to suprapatella       20 cm proximal to suprapatella 51.3 51     10 cm proximal to suprapatella 43.5 42.5     At midpatella / popliteal crease 39 38.5     30 cm proximal to floor at lateral plantar foot 39.5  38.5 38 37.7 37.7  20 cm proximal to floor at lateral plantar foot 32.5 30.5 30.3 30 30.3  10 cm proximal to floor at lateral plantar foot 26.4 23.2 24.7 24.1 24.0  Circumference of ankle/heel       5 cm proximal to 1st MTP joint 21 20.7 20.7 21.0 20.7  Across MTP joint 21.4 20.9 21 20.9 20.7  Around proximal great toe 7.6 7.4 7.5 7.5 7.6  (Blank rows = not tested)   LLIS:   22.06  TODAY'S TREATMENT   12/09/2021 Removed pt stockings, applied small amt of cocoa butter to bilateral LE's In supine: Short neck, 5 diaphragmatic breaths, Lt inguinal nodes, (no anastomosis due to Lt ALND in 1990); and then Lt LE working from proximal to distal, then retracing all steps (with intact sequence) reviewing with pt while performingThen on Rt side:  PT performed Rt axillary nodes, Rt inguino-axillary anastomosis, then Rt LE working non intact sequence from proximal to distal then retracing all steps.  Verbally  reviewed as performing with pt and answered pt questions.At end of session therapist donned her compression socks.    12/06/2021 Pt remeasured with fairly stable results. n supine: Short neck, 5 diaphragmatic breaths, Lt inguinal nodes, (no anastomosis due to Lt ALND in 1990); and then Lt LE working from proximal to distal, then retracing all steps (with intact sequence) reviewing with pt while performingThen on Rt side:  Pt performed Rt axillary nodes, Rt inguino-axillary anastomosis, then Rt LE working non intact sequence from proximal to distal then retracing all steps. At end of session therapist assisted pt with donning her compression socks.    12/04/21: Manual Therapy: In supine: Short neck, 5 diaphragmatic breaths, Lt inguinal nodes, (no anastomosis due to Lt ALND in 1990); and then Lt LE working from proximal to distal, then retracing all steps (with intact sequence) reviewing with pt while performing importance of trying to incorporate this daily, but especially on days like yesterday when she is on her feet more and takes her compression socks off. Then on Rt side: Rt axillary nodes, Rt inguino-axillary anastomosis, then Rt LE working non intact sequence from proximal to distal then retracing all steps. At end of session therapist assisted pt with donning her compression socks.  11/27/21 In supine: 5 diaphragmatic breaths, Lt inguinal nodes, (no anastomosis due to Lt ALND in 1990); and then Lt LE working from proximal to distal, then retracing  all steps (with intact sequence) and will continue intact for remaining sessions for Lt LE) educated pt throughout on correct sequence and skin stretch. At the end pt demonstrated LN activation and right inguinal-axillary pathway as well as correct sequence for the rest of the RLE. Pt was able to independently don her compression sock at end of session (therapist did one and pt donned one)  11/25/2021 Measured pts legs with good results noted. Left leg  still larger than right. Pt requested that I perform MLD on her today so she can feel it, and she preferred to do lying down rather than sitting up.  In supine with legs elevated: 5 diaphragmatic breaths, Lt inguinal nodes, (no anastomosis due to Lt ALND in 1990); and then Lt LE working from proximal to distal, then retracing all steps (with intact sequence) and will continue intact for remaining sessions for Lt LE) then on R side performed non intact sequence since axillary lymph nodes are intact on this side and directed to axillary LN's.  Therapist demonstrated what it feels like from therapist position, and then how it would feel if she did it on herself and answered pt questions. She demonstrated LN activation and right inguinal-axillary pathway with good technique after review     PATIENT EDUCATION:  Education details: educated pt in anatomy and physiology of the lymphatic system and basic principals of self MLD, compression pump, availability of compression stockings and velcro compression garments Person educated: Patient Education method: Customer service manager Education comprehension: verbalized understanding   HOME EXERCISE PROGRAM: Walk with compression bandages intact once we begin bandaging at next appointment  ASSESSMENT:  CLINICAL IMPRESSION: Pts leg dryness around ankles improved with cocoa butter today. Therapist performed MLD with verbalization to pt and answered pt questions. Pt still complaining of itchiness but no rash noted today.    OBJECTIVE IMPAIRMENTS decreased knowledge of condition, decreased knowledge of use of DME, and increased edema.   ACTIVITY LIMITATIONS  none  PARTICIPATION LIMITATIONS:  none  PERSONAL FACTORS  none  are also affecting patient's functional outcome.   REHAB POTENTIAL: Excellent  CLINICAL DECISION MAKING: Stable/uncomplicated  EVALUATION COMPLEXITY: Low  GOALS: Goals reviewed with patient? Yes  SHORT TERM GOALS=LONG  TERM GOALS Target date: 12/24/21  Pt will obtain appropriate compression garments for long term management of lymphedema.  Baseline: Goal status: MET 2.  Pt will be independent in self MLD for long term management of lymphedema.  Baseline:  Goal status:  3.  Pt will receive a trial of the FlexiTouch compression pump for long term management of lymphedema.  Baseline:  Goal status: MET, but does not wish to have  4.  Pt will demonstrate a 2 cm decrease of edema at 30 cm superior to medial malleoli on L LE to decrease risk of infection.  Baseline:  Goal status: Progressing PLAN: PT FREQUENCY: 3x/week  PT DURATION: 6 weeks  PLANNED INTERVENTIONS: Therapeutic exercises, Patient/Family education, Self Care, Orthotic/Fit training, DME instructions, Manual lymph drainage, Compression bandaging, Vasopneumatic device, and Manual therapy  PLAN FOR NEXT SESSION: How is itchiness of legs, cont to instruct in self MLD and encourage pt to perform this at least 1x/day,   (On Left side to Inguinal only due to Left ALND)   Claris Pong, PT 12/09/2021, 10:57 AM

## 2021-12-11 ENCOUNTER — Ambulatory Visit: Payer: Medicare HMO

## 2021-12-11 DIAGNOSIS — R262 Difficulty in walking, not elsewhere classified: Secondary | ICD-10-CM | POA: Diagnosis not present

## 2021-12-11 DIAGNOSIS — N3942 Incontinence without sensory awareness: Secondary | ICD-10-CM | POA: Diagnosis not present

## 2021-12-11 DIAGNOSIS — R293 Abnormal posture: Secondary | ICD-10-CM | POA: Diagnosis not present

## 2021-12-11 DIAGNOSIS — R278 Other lack of coordination: Secondary | ICD-10-CM | POA: Diagnosis not present

## 2021-12-11 DIAGNOSIS — Z8542 Personal history of malignant neoplasm of other parts of uterus: Secondary | ICD-10-CM

## 2021-12-11 DIAGNOSIS — R159 Full incontinence of feces: Secondary | ICD-10-CM | POA: Diagnosis not present

## 2021-12-11 DIAGNOSIS — M5136 Other intervertebral disc degeneration, lumbar region: Secondary | ICD-10-CM | POA: Diagnosis not present

## 2021-12-11 DIAGNOSIS — I890 Lymphedema, not elsewhere classified: Secondary | ICD-10-CM

## 2021-12-11 DIAGNOSIS — M6281 Muscle weakness (generalized): Secondary | ICD-10-CM | POA: Diagnosis not present

## 2021-12-11 DIAGNOSIS — G8929 Other chronic pain: Secondary | ICD-10-CM | POA: Diagnosis not present

## 2021-12-11 DIAGNOSIS — Z23 Encounter for immunization: Secondary | ICD-10-CM | POA: Diagnosis not present

## 2021-12-11 DIAGNOSIS — M25511 Pain in right shoulder: Secondary | ICD-10-CM | POA: Diagnosis not present

## 2021-12-11 NOTE — Therapy (Signed)
OUTPATIENT PHYSICAL THERAPY ONCOLOGY TREATMENT     Patient Name: Beth Simon MRN: [id number] DOB:18-Dec-1940, 81 y.o., female Today's Date: 12/11/2021   PT End of Session - 12/11/21 0909     Visit Number 12    Number of Visits 13    Date for PT Re-Evaluation 12/24/21    PT Start Time 0903    PT Stop Time 0957    PT Time Calculation (min) 54 min    Activity Tolerance Patient tolerated treatment well    Behavior During Therapy [facility] for tasks assessed/performed             Past Medical History:  Diagnosis Date   Adenomatous polyps 01/2016   Anemia    Breast cancer ([place]) 1990   bilateral mastectomy, adenoca breast-left MRM, reconstruction, chemo   Bronchitis last 2 weeks   saw dr Felipa Eth 02-28-2014, he said no antibiotics needed, nonproductive cough   Complication of anesthesia    Cystitis    cytoxen, had once or twice   Family history of breast cancer    History of breast cancer    History of radiation therapy 2/10, 2/12, 2/18, 2/24, 05/29/14   vaginal cuff/ 30 Gy/ 5 fx   Hypertension    Macular degeneration    Neck pain    taking physictal therapy last 2 weeks   Osteoporosis    On Prolia.   PONV (postoperative nausea and vomiting)    Shoulder pain    taking physical therapy for last few weeks   Uterine cancer ([place]) 03/21/2014   MLH1/PMS2 LOH   Uterine fibroid    VAIN I (vaginal intraepithelial neoplasia grade I) 2018   positive HR HPV.   Vasovagal syncopes    Past Surgical History:  Procedure Laterality Date   BREAST IMPLANT REMOVAL  6/94   left breast   BREAST RECONSTRUCTION  1990   left   CATARACT EXTRACTION Bilateral    EXCISION VAGINAL CYST     MASTECTOMY Right 10/08/06   prophylactic   RADICAL MASTECTOMY LND  1990   left, chemo done   ROBOTIC ASSISTED TOTAL HYSTERECTOMY WITH BILATERAL SALPINGO OOPHERECTOMY Bilateral 03/21/2014   Procedure: ROBOTIC ASSISTED TOTAL HYSTERECTOMY WITH BILATERAL SALPINGO OOPHORECTOMY ;  Surgeon: Janie Morning, MD;   Location: [facility];  Service: Gynecology;  Laterality: Bilateral;   Patient Active Problem List   Diagnosis Date Noted   Age-related osteoporosis without current pathological fracture 03/19/2020   Personal history of colonic polyps 03/19/2020   Transient cerebral ischemia 09/15/2017   Pure hypercholesterolemia 09/15/2017   Pulmonary nodule 09/15/2017   Hypertension 09/15/2017   Gastro-esophageal reflux disease without esophagitis 09/15/2017   VAIN I (vaginal intraepithelial neoplasia grade I) 06/23/2017   Genetic testing 05/23/2014   History of breast cancer    Family history of breast cancer    Endometrial cancer ([place]) 03/21/2014   Uterine cancer ([place]) 03/21/2014   S/P mastectomy, bilateral 05/02/2013   Situational stress 05/02/2013   Osteoporosis, unspecified 05/02/2013   Breast cancer, left breast ([place]) 05/02/2013    PCP: Lajean Manes, MD  REFERRING PROVIDER: Linus Mako, MD   REFERRING DIAG: I89.0 (ICD-10-CM) - Lymphedema, not elsewhere classified   THERAPY DIAG:  Lymphedema, not elsewhere classified  History of uterine cancer  ONSET DATE: 05/01/21  Rationale for Evaluation and Treatment Rehabilitation  SUBJECTIVE  SUBJECTIVE STATEMENT: I'm looking forward to my trip to [place] next week. I've been wearing the compression stockings every day.    PERTINENT HISTORY:  History notable for left breast cancer ER-PR-  treated with radical mastectomy and chemotherapy in 1990.  Subsequently underwent right simple mastectomy 2008 without reconstruction. Denies use of tamoxifen   On 03/21/14  she underwent a robotic hysterectomy for treatment of uterine cancer, BSO. Frozen section revealed grade 1 cancer. Final pathology revealed a grade 2 lesion with LVSI present and she was staged at stage  IA. She was recommended vaginal brachytherapy as adjuvant therapy to reduce risk for local recurrence.  Dr Karoline Caldwell delivered vaginal brachytherapy completed on 05/29/14. Her treatments were without complication. She has been using her vaginal dilator since.  PAIN:  Are you having pain? No  PRECAUTIONS: Other: bilateral LE lymphedema  WEIGHT BEARING RESTRICTIONS No  FALLS:  Has patient fallen in last 6 months? No  LIVING ENVIRONMENT: Lives with: lives alone Lives in: House/apartment Stairs: No;  Has following equipment at home: None  OCCUPATION: Retried  LEISURE: walks a couple of miles on Tuesday morning, on Thursday goes to piliates with Roanna Raider   HAND DOMINANCE : right   PRIOR LEVEL OF FUNCTION: Independent  PATIENT GOALS to have a tool in my toolkit to relieve the swelling and redistribute the lymphatic fluid   OBJECTIVE  COGNITION:  Overall cognitive status: Within functional limits for tasks assessed   PALPATION: Pitting edema at bilateral ankles  OBSERVATIONS / OTHER ASSESSMENTS: LLE more swollen than RLE  POSTURE: forward head and rounded shoulders LYMPHEDEMA ASSESSMENTS:   SURGERY TYPE/DATE: uterine cancer with total hysterectomy in 2016, 1990 L mastectomy and ALND, 2008 R mastectomy  NUMBER OF LYMPH NODES REMOVED: none  CHEMOTHERAPY: completed for L breast cancer  RADIATION:completed  HORMONE TREATMENT: none  INFECTIONS: none  LYMPHEDEMA ASSESSMENTS:   LOWER EXTREMITY LANDMARK RIGHT eval RIGHT 11/18/21 RIGHT 11/22/21 11/25/2021 12/06/2021  At groin       30 cm proximal to suprapatella       20 cm proximal to suprapatella 50.2 49     10 cm proximal to suprapatella 42 41.2     At midpatella / popliteal crease 38.6 38     30 cm proximal to floor at lateral plantar foot 38.6 37.7 37.5 37 36.8  20 cm proximal to floor at lateral plantar foot 30.4 30.1 30 29  29.2  10 cm proximal to floor at lateral plantar foot 24.5 23 23.5 22.7 22.8   Circumference of ankle/heel       5 cm proximal to 1st MTP joint 20.5 20 20.5 20.7 20.5  Across MTP joint 21 20.7 21.5 20.7 205  Around proximal great toe 7.5 7.5 7.3 7.5 7.4  (Blank rows = not tested)  LOWER EXTREMITY LANDMARK LEFT eval LEFT 11/18/21 11/22/2021 11/25/2021 12/06/2021  At groin       30 cm proximal to suprapatella       20 cm proximal to suprapatella 51.3 51     10 cm proximal to suprapatella 43.5 42.5     At midpatella / popliteal crease 39 38.5     30 cm proximal to floor at lateral plantar foot 39.5  38.5 38 37.7 37.7  20 cm proximal to floor at lateral plantar foot 32.5 30.5 30.3 30 30.3  10 cm proximal to floor at lateral plantar foot 26.4 23.2 24.7 24.1 24.0  Circumference of ankle/heel       5 cm proximal  to 1st MTP joint 21 20.7 20.7 21.0 20.7  Across MTP joint 21.4 20.9 21 20.9 20.7  Around proximal great toe 7.6 7.4 7.5 7.5 7.6  (Blank rows = not tested)   LLIS:   22.06  TODAY'S TREATMENT  12/11/21 Pt removed her stockings before session and therapist donned them for her after Manual Therapy In supine: Short neck, 5 diaphragmatic breaths, Lt inguinal nodes, (no anastomosis due to Lt ALND in 1990); and then [place] working from proximal to distal, then retracing all steps (with intact sequence) reviewing with pt while performingThen on Rt side:  PT performed Rt axillary nodes, Rt inguino-axillary anastomosis, then Rt LE working non intact sequence from proximal to distal then retracing all steps  12/09/2021 Removed pt stockings, applied small amt of cocoa butter to bilateral LE's In supine: Short neck, 5 diaphragmatic breaths, Lt inguinal nodes, (no anastomosis due to Lt ALND in 1990); and then [place] working from proximal to distal, then retracing all steps (with intact sequence) reviewing with pt while performingThen on Rt side:  PT performed Rt axillary nodes, Rt inguino-axillary anastomosis, then Rt LE working non intact sequence from proximal to distal then  retracing all steps.  Verbally reviewed as performing with pt and answered pt questions.At end of session therapist donned her compression socks.    12/06/2021 Pt remeasured with fairly stable results. n supine: Short neck, 5 diaphragmatic breaths, Lt inguinal nodes, (no anastomosis due to Lt ALND in 1990); and then Lt LE working from proximal to distal, then retracing all steps (with intact sequence) reviewing with pt while performingThen on Rt side:  Pt performed Rt axillary nodes, Rt inguino-axillary anastomosis, then Rt LE working non intact sequence from proximal to distal then retracing all steps. At end of session therapist assisted pt with donning her compression socks.      PATIENT EDUCATION:  Education details: educated pt in anatomy and physiology of the lymphatic system and basic principals of self MLD, compression pump, availability of compression stockings and velcro compression garments Person educated: Patient Education method: Customer service manager Education comprehension: verbalized understanding   HOME EXERCISE PROGRAM: Walk with compression bandages intact once we begin bandaging at next appointment  ASSESSMENT:  CLINICAL IMPRESSION: Pts leg dryness much improved today and she had no c/o itchiness today. Continued with MLD to bil LE's. She leaves for her trip to [place] next week.     OBJECTIVE IMPAIRMENTS decreased knowledge of condition, decreased knowledge of use of DME, and increased edema.   ACTIVITY LIMITATIONS  none  PARTICIPATION LIMITATIONS:  none  PERSONAL FACTORS  none  are also affecting patient's functional outcome.   REHAB POTENTIAL: Excellent  CLINICAL DECISION MAKING: Stable/uncomplicated  EVALUATION COMPLEXITY: Low  GOALS: Goals reviewed with patient? Yes  SHORT TERM GOALS=LONG TERM GOALS Target date: 12/24/21  Pt will obtain appropriate compression garments for long term management of lymphedema.  Baseline: Goal status:  MET 2.  Pt will be independent in self MLD for long term management of lymphedema.  Baseline:  Goal status:  3.  Pt will receive a trial of the FlexiTouch compression pump for long term management of lymphedema.  Baseline:  Goal status: MET, but does not wish to have  4.  Pt will demonstrate a 2 cm decrease of edema at 30 cm superior to medial malleoli on L LE to decrease risk of infection.  Baseline:  Goal status: Progressing PLAN: PT FREQUENCY: 3x/week  PT DURATION: 6 weeks  PLANNED INTERVENTIONS: Therapeutic  exercises, Patient/Family education, Self Care, Orthotic/Fit training, DME instructions, Manual lymph drainage, Compression bandaging, Vasopneumatic device, and Manual therapy  PLAN FOR NEXT SESSION: How is itchiness of legs, cont to instruct in self MLD and encourage pt to perform this at least 1x/day,   (On Left side to Inguinal only due to Left ALND)   Otelia Limes, PTA 12/11/2021, 10:03 AM

## 2021-12-13 ENCOUNTER — Ambulatory Visit: Payer: Medicare HMO

## 2021-12-13 DIAGNOSIS — M25511 Pain in right shoulder: Secondary | ICD-10-CM | POA: Diagnosis not present

## 2021-12-13 DIAGNOSIS — R278 Other lack of coordination: Secondary | ICD-10-CM | POA: Diagnosis not present

## 2021-12-13 DIAGNOSIS — G8929 Other chronic pain: Secondary | ICD-10-CM | POA: Diagnosis not present

## 2021-12-13 DIAGNOSIS — M5136 Other intervertebral disc degeneration, lumbar region: Secondary | ICD-10-CM | POA: Diagnosis not present

## 2021-12-13 DIAGNOSIS — I890 Lymphedema, not elsewhere classified: Secondary | ICD-10-CM

## 2021-12-13 DIAGNOSIS — R293 Abnormal posture: Secondary | ICD-10-CM | POA: Diagnosis not present

## 2021-12-13 DIAGNOSIS — M6281 Muscle weakness (generalized): Secondary | ICD-10-CM | POA: Diagnosis not present

## 2021-12-13 DIAGNOSIS — N3942 Incontinence without sensory awareness: Secondary | ICD-10-CM | POA: Diagnosis not present

## 2021-12-13 DIAGNOSIS — Z8542 Personal history of malignant neoplasm of other parts of uterus: Secondary | ICD-10-CM

## 2021-12-13 DIAGNOSIS — R159 Full incontinence of feces: Secondary | ICD-10-CM | POA: Diagnosis not present

## 2021-12-13 DIAGNOSIS — R262 Difficulty in walking, not elsewhere classified: Secondary | ICD-10-CM | POA: Diagnosis not present

## 2021-12-13 NOTE — Therapy (Signed)
OUTPATIENT PHYSICAL THERAPY ONCOLOGY TREATMENT     Patient Name: RHILEY TARVER MRN: [id number] DOB:12/30/1940, 81 y.o., female Today's Date: 12/13/2021   PT End of Session - 12/13/21 0901     Visit Number 13    Number of Visits 19    Date for PT Re-Evaluation 01/24/22    PT Start Time 0902    PT Stop Time 0950    PT Time Calculation (min) 48 min    Activity Tolerance Patient tolerated treatment well    Behavior During Therapy [facility] for tasks assessed/performed             Past Medical History:  Diagnosis Date   Adenomatous polyps 01/2016   Anemia    Breast cancer ([place]) 1990   bilateral mastectomy, adenoca breast-left MRM, reconstruction, chemo   Bronchitis last 2 weeks   saw dr Felipa Eth 02-28-2014, he said no antibiotics needed, nonproductive cough   Complication of anesthesia    Cystitis    cytoxen, had once or twice   Family history of breast cancer    History of breast cancer    History of radiation therapy 2/10, 2/12, 2/18, 2/24, 05/29/14   vaginal cuff/ 30 Gy/ 5 fx   Hypertension    Macular degeneration    Neck pain    taking physictal therapy last 2 weeks   Osteoporosis    On Prolia.   PONV (postoperative nausea and vomiting)    Shoulder pain    taking physical therapy for last few weeks   Uterine cancer ([place]) 03/21/2014   MLH1/PMS2 LOH   Uterine fibroid    VAIN I (vaginal intraepithelial neoplasia grade I) 2018   positive HR HPV.   Vasovagal syncopes    Past Surgical History:  Procedure Laterality Date   BREAST IMPLANT REMOVAL  6/94   left breast   BREAST RECONSTRUCTION  1990   left   CATARACT EXTRACTION Bilateral    EXCISION VAGINAL CYST     MASTECTOMY Right 10/08/06   prophylactic   RADICAL MASTECTOMY LND  1990   left, chemo done   ROBOTIC ASSISTED TOTAL HYSTERECTOMY WITH BILATERAL SALPINGO OOPHERECTOMY Bilateral 03/21/2014   Procedure: ROBOTIC ASSISTED TOTAL HYSTERECTOMY WITH BILATERAL SALPINGO OOPHORECTOMY ;  Surgeon: Janie Morning, MD;   Location: [facility];  Service: Gynecology;  Laterality: Bilateral;   Patient Active Problem List   Diagnosis Date Noted   Age-related osteoporosis without current pathological fracture 03/19/2020   Personal history of colonic polyps 03/19/2020   Transient cerebral ischemia 09/15/2017   Pure hypercholesterolemia 09/15/2017   Pulmonary nodule 09/15/2017   Hypertension 09/15/2017   Gastro-esophageal reflux disease without esophagitis 09/15/2017   VAIN I (vaginal intraepithelial neoplasia grade I) 06/23/2017   Genetic testing 05/23/2014   History of breast cancer    Family history of breast cancer    Endometrial cancer ([place]) 03/21/2014   Uterine cancer ([place]) 03/21/2014   S/P mastectomy, bilateral 05/02/2013   Situational stress 05/02/2013   Osteoporosis, unspecified 05/02/2013   Breast cancer, left breast ([place]) 05/02/2013    PCP: Lajean Manes, MD  REFERRING PROVIDER: Linus Mako, MD   REFERRING DIAG: I89.0 (ICD-10-CM) - Lymphedema, not elsewhere classified   THERAPY DIAG:  Lymphedema, not elsewhere classified  History of uterine cancer  ONSET DATE: 05/01/21  Rationale for Evaluation and Treatment Rehabilitation  SUBJECTIVE  SUBJECTIVE STATEMENT: Leave for my trip on Wednesday. I may have to cancel my appt on Monday. I have a lot to do before leaving.  I took the socks off at 2:00 because it was hot and itchy. My left leg got really swollen but I did my MLD and it was reduced nicely this am when I got up.    PERTINENT HISTORY:  History notable for left breast cancer ER-PR-  treated with radical mastectomy and chemotherapy in 1990.  Subsequently underwent right simple mastectomy 2008 without reconstruction. Denies use of tamoxifen   On 03/21/14  she underwent a robotic hysterectomy for  treatment of uterine cancer, BSO. Frozen section revealed grade 1 cancer. Final pathology revealed a grade 2 lesion with LVSI present and she was staged at stage IA. She was recommended vaginal brachytherapy as adjuvant therapy to reduce risk for local recurrence.  Dr Lanell Persons delivered vaginal brachytherapy completed on 05/29/14. Her treatments were without complication. She has been using her vaginal dilator since.  PAIN:  Are you having pain? No  PRECAUTIONS: Other: bilateral LE lymphedema  WEIGHT BEARING RESTRICTIONS No  FALLS:  Has patient fallen in last 6 months? No  LIVING ENVIRONMENT: Lives with: lives alone Lives in: House/apartment Stairs: No;  Has following equipment at home: None  OCCUPATION: Retried  LEISURE: walks a couple of miles on Tuesday morning, on Thursday goes to piliates with [employer]   HAND DOMINANCE : right   PRIOR LEVEL OF FUNCTION: Independent  PATIENT GOALS to have a tool in my toolkit to relieve the swelling and redistribute the lymphatic fluid   OBJECTIVE  COGNITION:  Overall cognitive status: Within functional limits for tasks assessed   PALPATION: Pitting edema at bilateral ankles  OBSERVATIONS / OTHER ASSESSMENTS: LLE more swollen than RLE  POSTURE: forward head and rounded shoulders LYMPHEDEMA ASSESSMENTS:   SURGERY TYPE/DATE: uterine cancer with total hysterectomy in 2016, 1990 L mastectomy and ALND, 2008 R mastectomy  NUMBER OF LYMPH NODES REMOVED: none  CHEMOTHERAPY: completed for L breast cancer  RADIATION:completed  HORMONE TREATMENT: none  INFECTIONS: none  LYMPHEDEMA ASSESSMENTS:   LOWER EXTREMITY LANDMARK RIGHT eval RIGHT 11/18/21 RIGHT 11/22/21 11/25/2021 12/06/2021  At groin       30 cm proximal to suprapatella       20 cm proximal to suprapatella 50.2 49     10 cm proximal to suprapatella 42 41.2     At midpatella / popliteal crease 38.6 38     30 cm proximal to floor at lateral plantar foot 38.6 37.7 37.5 37  36.8  20 cm proximal to floor at lateral plantar foot 30.4 30._0 29.2  10 cm proximal to floor at lateral plantar foot 24.5 23 23.5 22.7 22.8  Circumference of ankle/heel       5 cm proximal to 1st MTP joint 20.5 20 20.5 20.7 20.5  Across MTP joint 21 20.7 21.5 20.7 205  Around proximal great toe 7.5 7.5 7.3 7.5 7.4  (Blank rows = not tested)  LOWER EXTREMITY LANDMARK LEFT eval LEFT 11/18/21 11/22/2021 11/25/2021 12/06/2021  At groin       30 cm proximal to suprapatella       20 cm proximal to suprapatella 51.3 51     10 cm proximal to suprapatella 43.5 42.5     At midpatella / popliteal crease 39 38.5     30 cm proximal to floor at lateral plantar foot 39.5  38.5 38 37.7 37.7  20  cm proximal to floor at lateral plantar foot 32.5 30.5 30.3 30 30.3  10 cm proximal to floor at lateral plantar foot 26.4 23.2 24.7 24.1 24.0  Circumference of ankle/heel       5 cm proximal to 1st MTP joint 21 20.7 20.7 21.0 20.7  Across MTP joint 21.4 20.9 21 20.9 20.7  Around proximal great toe 7.6 7.4 7.5 7.5 7.6  (Blank rows = not tested)   LLIS:   22.06  TODAY'S TREATMENT   12/13/2021 Manual Therapy In supine: Short neck, 5 diaphragmatic breaths, Lt inguinal nodes, (no anastomosis due to Lt ALND in 1990); and then Lt LE working from proximal to distal, then retracing all steps (with intact sequence) reviewing with pt while performingThen on Rt side:  PT performed Rt axillary nodes, Rt inguino-axillary anastomosis, then Rt LE working non intact sequence from proximal to distal then retracing all steps  Manual Therapy In supine: Short neck, 5 diaphragmatic breaths, Lt inguinal nodes, (no anastomosis due to Lt ALND in 1990); and then [place] working from proximal to distal, then retracing all steps (with intact sequence) reviewing with pt while performingThen on Rt side:  PT performed Rt axillary nodes, Rt inguino-axillary anastomosis, then Rt LE working non intact sequence from proximal to distal then  retracing all steps   12/11/21 Pt removed her stockings before session and therapist donned them for her after Manual Therapy In supine: Short neck, 5 diaphragmatic breaths, Lt inguinal nodes, (no anastomosis due to Lt ALND in 1990); and then [place] working from proximal to distal, then retracing all steps (with intact sequence) reviewing with pt while performingThen on Rt side:  PT performed Rt axillary nodes, Rt inguino-axillary anastomosis, then Rt LE working non intact sequence from proximal to distal then retracing all steps  12/09/2021 Removed pt stockings, applied small amt of cocoa butter to bilateral LE's In supine: Short neck, 5 diaphragmatic breaths, Lt inguinal nodes, (no anastomosis due to Lt ALND in 1990); and then [place] working from proximal to distal, then retracing all steps (with intact sequence) reviewing with pt while performingThen on Rt side:  PT performed Rt axillary nodes, Rt inguino-axillary anastomosis, then Rt LE working non intact sequence from proximal to distal then retracing all steps.  Verbally reviewed as performing with pt and answered pt questions.At end of session therapist donned her compression socks.    12/06/2021 Pt remeasured with fairly stable results. n supine: Short neck, 5 diaphragmatic breaths, Lt inguinal nodes, (no anastomosis due to Lt ALND in 1990); and then Lt LE working from proximal to distal, then retracing all steps (with intact sequence) reviewing with pt while performingThen on Rt side:  Pt performed Rt axillary nodes, Rt inguino-axillary anastomosis, then Rt LE working non intact sequence from proximal to distal then retracing all steps. At end of session therapist assisted pt with donning her compression socks.      PATIENT EDUCATION:  Education details: educated pt in anatomy and physiology of the lymphatic system and basic principals of self MLD, compression pump, availability of compression stockings and velcro compression  garments Person educated: Patient Education method: Customer service manager Education comprehension: verbalized understanding   HOME EXERCISE PROGRAM: Walk with compression bandages intact once we begin bandaging at next appointment  ASSESSMENT:  CLINICAL IMPRESSION: Pts leg dryness continue. Continued with MLD to bil LE's.  Cocoa butter seemed to help dryness on her legs.She leaves for her trip to [place] next week and would like to return  1 visit after her trip to have things checked before being discharge..     OBJECTIVE IMPAIRMENTS decreased knowledge of condition, decreased knowledge of use of DME, and increased edema.   ACTIVITY LIMITATIONS  none  PARTICIPATION LIMITATIONS:  none  PERSONAL FACTORS  none  are also affecting patient's functional outcome.   REHAB POTENTIAL: Excellent  CLINICAL DECISION MAKING: Stable/uncomplicated  EVALUATION COMPLEXITY: Low  GOALS: Goals reviewed with patient? Yes  SHORT TERM GOALS=LONG TERM GOALS Target date: 12/24/21  Pt will obtain appropriate compression garments for long term management of lymphedema.  Baseline: Goal status: MET 2.  Pt will be independent in self MLD for long term management of lymphedema.  Baseline:  Goal status:  3.  Pt will receive a trial of the FlexiTouch compression pump for long term management of lymphedema.  Baseline:  Goal status: MET, but does not wish to have  4.  Pt will demonstrate a 2 cm decrease of edema at 30 cm superior to medial malleoli on L LE to decrease risk of infection.  Baseline:  Goal status: Progressing PLAN: PT FREQUENCY: 1x/week prn  PT DURATION: 6 weeks  PLANNED INTERVENTIONS: Therapeutic exercises, Patient/Family education, Self Care, Orthotic/Fit training, DME instructions, Manual lymph drainage, Compression bandaging, Vasopneumatic device, and Manual therapy  PLAN FOR NEXT SESSION: How is itchiness of legs, cont to instruct in self MLD and encourage pt to perform  this at least 1x/day,   (On Left side to Inguinal only due to Left ALND)   Claris Pong, PT 12/13/2021, 10:02 AM

## 2021-12-16 ENCOUNTER — Encounter: Payer: Medicare HMO | Admitting: Physical Therapy

## 2021-12-24 ENCOUNTER — Encounter: Payer: Self-pay | Admitting: Obstetrics and Gynecology

## 2022-01-07 ENCOUNTER — Ambulatory Visit: Payer: Medicare HMO | Admitting: Podiatry

## 2022-01-07 DIAGNOSIS — Q828 Other specified congenital malformations of skin: Secondary | ICD-10-CM | POA: Diagnosis not present

## 2022-01-07 DIAGNOSIS — B351 Tinea unguium: Secondary | ICD-10-CM | POA: Diagnosis not present

## 2022-01-07 DIAGNOSIS — M79676 Pain in unspecified toe(s): Secondary | ICD-10-CM

## 2022-01-08 ENCOUNTER — Ambulatory Visit: Payer: Medicare HMO | Attending: Family Medicine

## 2022-01-08 DIAGNOSIS — Z8542 Personal history of malignant neoplasm of other parts of uterus: Secondary | ICD-10-CM | POA: Insufficient documentation

## 2022-01-08 DIAGNOSIS — I890 Lymphedema, not elsewhere classified: Secondary | ICD-10-CM | POA: Diagnosis not present

## 2022-01-08 NOTE — Therapy (Signed)
OUTPATIENT PHYSICAL THERAPY ONCOLOGY TREATMENT     Patient Name: Beth Simon MRN: [id number] DOB:[birth year], 81 y.o., female Today's Date: 01/08/2022   PT End of Session - 01/08/22 1109     Visit Number 14    Number of Visits 19    Date for PT Re-Evaluation 01/24/22    PT Start Time 1109   late   PT Stop Time 1152    PT Time Calculation (min) 43 min    Activity Tolerance Patient tolerated treatment well    Behavior During Therapy [facility] for tasks assessed/performed             Past Medical History:  Diagnosis Date   Adenomatous polyps 01/2016   Anemia    Breast cancer ([place]) 1990   bilateral mastectomy, adenoca breast-left MRM, reconstruction, chemo   Bronchitis last 2 weeks   saw dr Felipa Eth 02-28-2014, he said no antibiotics needed, nonproductive cough   Complication of anesthesia    Cystitis    cytoxen, had once or twice   Family history of breast cancer    History of breast cancer    History of radiation therapy 2/10, 2/12, 2/18, 2/24, 05/29/14   vaginal cuff/ 30 Gy/ 5 fx   Hypertension    Macular degeneration    Neck pain    taking physictal therapy last 2 weeks   Osteoporosis    On Prolia.   PONV (postoperative nausea and vomiting)    Shoulder pain    taking physical therapy for last few weeks   Uterine cancer ([place]) 03/21/2014   MLH1/PMS2 LOH   Uterine fibroid    VAIN I (vaginal intraepithelial neoplasia grade I) 2018   positive HR HPV.   Vasovagal syncopes    Past Surgical History:  Procedure Laterality Date   BREAST IMPLANT REMOVAL  6/94   left breast   BREAST RECONSTRUCTION  1990   left   CATARACT EXTRACTION Bilateral    EXCISION VAGINAL CYST     MASTECTOMY Right 10/08/06   prophylactic   RADICAL MASTECTOMY LND  1990   left, chemo done   ROBOTIC ASSISTED TOTAL HYSTERECTOMY WITH BILATERAL SALPINGO OOPHERECTOMY Bilateral 03/21/2014   Procedure: ROBOTIC ASSISTED TOTAL HYSTERECTOMY WITH BILATERAL SALPINGO OOPHORECTOMY ;  Surgeon: Janie Morning, MD;  Location: [facility];  Service: Gynecology;  Laterality: Bilateral;   Patient Active Problem List   Diagnosis Date Noted   Age-related osteoporosis without current pathological fracture 03/19/2020   Personal history of colonic polyps 03/19/2020   Transient cerebral ischemia 09/15/2017   Pure hypercholesterolemia 09/15/2017   Pulmonary nodule 09/15/2017   Hypertension 09/15/2017   Gastro-esophageal reflux disease without esophagitis 09/15/2017   VAIN I (vaginal intraepithelial neoplasia grade I) 06/23/2017   Genetic testing 05/23/2014   History of breast cancer    Family history of breast cancer    Endometrial cancer ([place]) 03/21/2014   Uterine cancer ([place]) 03/21/2014   S/P mastectomy, bilateral 05/02/2013   Situational stress 05/02/2013   Osteoporosis, unspecified 05/02/2013   Breast cancer, left breast ([place]) 05/02/2013    PCP: Lajean Manes, MD  REFERRING PROVIDER: Linus Mako, MD   REFERRING DIAG: I89.0 (ICD-10-CM) - Lymphedema, not elsewhere classified   THERAPY DIAG:  Lymphedema, not elsewhere classified  History of uterine cancer  ONSET DATE: 05/01/21  Rationale for Evaluation and Treatment Rehabilitation  SUBJECTIVE  SUBJECTIVE STATEMENT: Had a wonderful time on my trip.  My legs did very well. I did a lot of walking and I got in bed about 11:00 every night.  No problem with the itching.    PERTINENT HISTORY:  History notable for left breast cancer ER-PR-  treated with radical mastectomy and chemotherapy in 1990.  Subsequently underwent right simple mastectomy 2008 without reconstruction. Denies use of tamoxifen   On 03/21/14  she underwent a robotic hysterectomy for treatment of uterine cancer, BSO. Frozen section revealed grade 1 cancer. Final pathology revealed a  grade 2 lesion with LVSI present and she was staged at stage IA. She was recommended vaginal brachytherapy as adjuvant therapy to reduce risk for local recurrence.  Dr Lanell Persons delivered vaginal brachytherapy completed on 05/29/14. Her treatments were without complication. She has been using her vaginal dilator since.  PAIN:  Are you having pain? No  PRECAUTIONS: Other: bilateral LE lymphedema  WEIGHT BEARING RESTRICTIONS No  FALLS:  Has patient fallen in last 6 months? No  LIVING ENVIRONMENT: Lives with: lives alone Lives in: House/apartment Stairs: No;  Has following equipment at home: None  OCCUPATION: Retried  LEISURE: walks a couple of miles on Tuesday morning, on Thursday goes to [place] with [employer]   HAND DOMINANCE : right   PRIOR LEVEL OF FUNCTION: Independent  PATIENT GOALS to have a tool in my toolkit to relieve the swelling and redistribute the lymphatic fluid   OBJECTIVE  COGNITION:  Overall cognitive status: Within functional limits for tasks assessed   PALPATION: Pitting edema at bilateral ankles  OBSERVATIONS / OTHER ASSESSMENTS: LLE more swollen than RLE  POSTURE: forward head and rounded shoulders LYMPHEDEMA ASSESSMENTS:   SURGERY TYPE/DATE: uterine cancer with total hysterectomy in 2016, 1990 L mastectomy and ALND, 2008 R mastectomy  NUMBER OF LYMPH NODES REMOVED: none  CHEMOTHERAPY: completed for L breast cancer  RADIATION:completed  HORMONE TREATMENT: none  INFECTIONS: none  LYMPHEDEMA ASSESSMENTS:   LOWER EXTREMITY LANDMARK RIGHT eval RIGHT 11/18/21 RIGHT 11/22/21 11/25/2021 12/06/2021 01/08/2022  At groin        30 cm proximal to suprapatella        20 cm proximal to suprapatella 50.2 49      10 cm proximal to suprapatella 42 41.2      At midpatella / popliteal crease 38.6 38      30 cm proximal to floor at lateral plantar foot 38.6 37.7 37.5 37 36.8 36.8  20 cm proximal to floor at lateral plantar foot 30.4 30._0 29.2 28.2   10 cm proximal to floor at lateral plantar foot 24.5 23 23.5 22.7 22.8 22.0  Circumference of ankle/heel        5 cm proximal to 1st MTP joint 20.5 20 20.5 20.7 20.5 20.5  Across MTP joint 21 20.7 21.5 20.7 205 20.3  Around proximal great toe 7.5 7.5 7.3 7.5 7.4 7.2  (Blank rows = not tested)  LOWER EXTREMITY LANDMARK LEFT eval LEFT 11/18/21 11/22/2021 11/25/2021 12/06/2021 01/08/2022  At groin        30 cm proximal to suprapatella        20 cm proximal to suprapatella 51.3 51      10 cm proximal to suprapatella 43.5 42.5      At midpatella / popliteal crease 39 38.5      30 cm proximal to floor at lateral plantar foot 39.5  38.5 38 37.7 37.7 37  20 cm proximal to floor at  lateral plantar foot 32.5 30.5 30.3 30 30.3 29.7  10 cm proximal to floor at lateral plantar foot 26.4 23.2 24.7 24.1 24.0 22.7  Circumference of ankle/heel        5 cm proximal to 1st MTP joint 21 20.7 20.7 21.0 20.7 20.5  Across MTP joint 21.4 20.9 21 20.9 20.7 20.6  Around proximal great toe 7.6 7.4 7.5 7.5 7.6 7.2  (Blank rows = not tested)   LLIS:   22.06  TODAY'S TREATMENT   01/08/2022 Pt. Measured bilaterally with excellent results Manual Therapy In supine: Short neck, 5 diaphragmatic breaths, Lt inguinal nodes, (no anastomosis due to Lt ALND in 1990); and then Lt LE working from proximal to distal, then retracing all steps (with intact sequence) reviewing with pt while performingThen on Rt side:  PT performed Rt axillary nodes, Rt inguino-axillary anastomosis, then Rt LE working non intact sequence from proximal to distal then retracing all steps. Verbalized to pt while performing Assisted pt with donning her compression socks.  12/13/2021 Manual Therapy In supine: Short neck, 5 diaphragmatic breaths, Lt inguinal nodes, (no anastomosis due to Lt ALND in 1990); and then [place] working from proximal to distal, then retracing all steps (with intact sequence) reviewing with pt while performingThen on Rt side:   PT performed Rt axillary nodes, Rt inguino-axillary anastomosis, then Rt LE working non intact sequence from proximal to distal then retracing all steps  Manual Therapy In supine: Short neck, 5 diaphragmatic breaths, Lt inguinal nodes, (no anastomosis due to Lt ALND in 1990); and then [place] working from proximal to distal, then retracing all steps (with intact sequence) reviewing with pt while performingThen on Rt side:  PT performed Rt axillary nodes, Rt inguino-axillary anastomosis, then Rt LE working non intact sequence from proximal to distal then retracing all steps   12/11/21 Pt removed her stockings before session and therapist donned them for her after Manual Therapy In supine: Short neck, 5 diaphragmatic breaths, Lt inguinal nodes, (no anastomosis due to Lt ALND in 1990); and then [place] working from proximal to distal, then retracing all steps (with intact sequence) reviewing with pt while performingThen on Rt side:  PT performed Rt axillary nodes, Rt inguino-axillary anastomosis, then Rt LE working non intact sequence from proximal to distal then retracing all steps  12/09/2021 Removed pt stockings, applied small amt of cocoa butter to bilateral LE's In supine: Short neck, 5 diaphragmatic breaths, Lt inguinal nodes, (no anastomosis due to Lt ALND in 1990); and then [place] working from proximal to distal, then retracing all steps (with intact sequence) reviewing with pt while performingThen on Rt side:  PT performed Rt axillary nodes, Rt inguino-axillary anastomosis, then Rt LE working non intact sequence from proximal to distal then retracing all steps.  Verbally reviewed as performing with pt and answered pt questions.At end of session therapist donned her compression socks.    12/06/2021 Pt remeasured with fairly stable results. n supine: Short neck, 5 diaphragmatic breaths, Lt inguinal nodes, (no anastomosis due to Lt ALND in 1990); and then Lt LE working from proximal to distal, then  retracing all steps (with intact sequence) reviewing with pt while performingThen on Rt side:  Pt performed Rt axillary nodes, Rt inguino-axillary anastomosis, then Rt LE working non intact sequence from proximal to distal then retracing all steps. At end of session therapist assisted pt with donning her compression socks.      PATIENT EDUCATION:  Education details: educated pt in anatomy  and physiology of the lymphatic system and basic principals of self MLD, compression pump, availability of compression stockings and velcro compression garments Person educated: Patient Education method: Explanation and Demonstration Education comprehension: verbalized understanding   HOME EXERCISE PROGRAM: Walk with compression bandages intact once we begin bandaging at next appointment  ASSESSMENT:  CLINICAL IMPRESSION:  Pts legs look the best today that they have been. She has achieved all goals established. She is independent with self management of lymphedema and feels ready to be discharged.  OBJECTIVE IMPAIRMENTS decreased knowledge of condition, decreased knowledge of use of DME, and increased edema.   ACTIVITY LIMITATIONS  none  PARTICIPATION LIMITATIONS:  none  PERSONAL FACTORS  none  are also affecting patient's functional outcome.   REHAB POTENTIAL: Excellent  CLINICAL DECISION MAKING: Stable/uncomplicated  EVALUATION COMPLEXITY: Low  GOALS: Goals reviewed with patient? Yes  SHORT TERM GOALS=LONG TERM GOALS Target date: 12/24/21  Pt will obtain appropriate compression garments for long term management of lymphedema.  Baseline: Goal status: MET 2.  Pt will be independent in self MLD for long term management of lymphedema.  Baseline:  Goal status: MET 01/08/2022 3.  Pt will receive a trial of the FlexiTouch compression pump for long term management of lymphedema.  Baseline:  Goal status: MET, but does not wish to have  4.  Pt will demonstrate a 2 cm decrease of edema at  30 cm superior to medial malleoli on L LE to decrease risk of infection.  Baseline:  Goal status: MET 01/08/2022 PLAN: PT FREQUENCY: 1x/week prn  PT DURATION: 6 weeks  PLANNED INTERVENTIONS: Therapeutic exercises, Patient/Family education, Self Care, Orthotic/Fit training, DME instructions, Manual lymph drainage, Compression bandaging, Vasopneumatic device, and Manual therapy  PLAN FOR NEXT SESSION: Pt discharged to independent self management. She was advised to contact us with any questions or concerns.  Claris Pong, PT 01/08/2022, 11:56 AM

## 2022-01-13 NOTE — Progress Notes (Signed)
Subjective: Chief Complaint  Patient presents with   Nail Problem    Routine foot care      81 y.o. returns the office today for painful, elongated, thickened toenails which she is unable to trim herself as well as for a callus to her second digit on her left foot.  She recently just got back from [place] and did a lot of walking.  Objective: AAO 3, NAD DP/PT pulses palpable, CRT less than 3 seconds Chronic swelling present bilaterally.  There is no open lesions. Nails hypertrophic, dystrophic, elongated, brittle, discolored 10. There is tenderness overlying the nails 1-5 bilaterally. There is no surrounding erythema or drainage along the nail sites.  Chronic continuation incurvation to the lesser digit toenails left side worse than right.   Hyperkeratotic lesions of the distal aspect left second digit. No underlying ulceration, drainage or other signs of infection.  No other areas of discomfort today. Hammertoes present bilaterally No pain with calf compression, warmth, erythema.  Assessment: Symptomatic onychomycosis, ingrown toenails; hyperkeratotic lesion left foot; lower extremity edema left side worse than right  Plan: -Treatment options including alternatives, risks, complications were discussed -Nails sharply debrided 10 without any complications or bleeding.  -Hyperkeratotic lesion sharply debrided 1 without complication/bleeding.  This is due to hammertoe deformity.  Continue offloading -Monitor for any clinical signs or symptoms of infection and directed to call the office immediately should any occur or go to the ER.  Celesta Gentile, DPM

## 2022-01-23 DIAGNOSIS — E559 Vitamin D deficiency, unspecified: Secondary | ICD-10-CM | POA: Diagnosis not present

## 2022-01-23 DIAGNOSIS — M810 Age-related osteoporosis without current pathological fracture: Secondary | ICD-10-CM | POA: Diagnosis not present

## 2022-02-18 ENCOUNTER — Ambulatory Visit: Payer: Medicare HMO | Admitting: Podiatry

## 2022-03-17 DIAGNOSIS — L430 Hypertrophic lichen planus: Secondary | ICD-10-CM | POA: Diagnosis not present

## 2022-03-17 DIAGNOSIS — D225 Melanocytic nevi of trunk: Secondary | ICD-10-CM | POA: Diagnosis not present

## 2022-03-17 DIAGNOSIS — D485 Neoplasm of uncertain behavior of skin: Secondary | ICD-10-CM | POA: Diagnosis not present

## 2022-03-18 ENCOUNTER — Ambulatory Visit: Payer: Medicare HMO | Admitting: Podiatry

## 2022-03-18 DIAGNOSIS — B351 Tinea unguium: Secondary | ICD-10-CM

## 2022-03-18 DIAGNOSIS — M79676 Pain in unspecified toe(s): Secondary | ICD-10-CM

## 2022-03-18 DIAGNOSIS — Q828 Other specified congenital malformations of skin: Secondary | ICD-10-CM

## 2022-03-25 NOTE — Progress Notes (Signed)
Subjective: Chief Complaint  Patient presents with   Nail Problem    Routine foot care      81 y.o. returns the office today for painful, elongated, thickened toenails which she is unable to trim herself as well as for a callus to her second digit on her left foot.  She states they are starting to hurt again but denies any swelling redness or drainage.  No other concerns.  Objective: AAO 3, NAD DP/PT pulses palpable, CRT less than 3 seconds Chronic swelling present bilaterally.  There is no open lesions. Nails hypertrophic, dystrophic, elongated, brittle, discolored 10. There is tenderness overlying the nails 1-5 bilaterally. There is no surrounding erythema or drainage along the nail sites.  Ingrown toenails present with any signs of infection.  Does appear to be somewhat worse, the last appointment this part of the degree of the ingrown. Hyperkeratotic lesions of the distal aspect left second digit. No underlying ulceration, drainage or other signs of infection.  No other areas of discomfort today. Hammertoes present bilaterally No pain with calf compression, warmth, erythema.  Assessment: Symptomatic onychomycosis, ingrown toenails; hyperkeratotic lesion left foot; lower extremity edema left side worse than right  Plan: -Treatment options including alternatives, risks, complications were discussed -Nails sharply debrided 10 without any complications or bleeding.  Nails have gotten longer short to have missing appointments or getting ingrown.  Monitoring signs or symptoms of infection. -Hyperkeratotic lesion sharply debrided 1 without complication/bleeding.  This is due to hammertoe deformity.  Continue offloading -Monitor for any clinical signs or symptoms of infection and directed to call the office immediately should any occur or go to the ER.  Celesta Gentile, DPM

## 2022-04-17 ENCOUNTER — Ambulatory Visit: Payer: Medicare HMO | Admitting: Podiatry

## 2022-04-17 DIAGNOSIS — B351 Tinea unguium: Secondary | ICD-10-CM

## 2022-04-17 DIAGNOSIS — Q828 Other specified congenital malformations of skin: Secondary | ICD-10-CM | POA: Diagnosis not present

## 2022-04-17 DIAGNOSIS — M79676 Pain in unspecified toe(s): Secondary | ICD-10-CM

## 2022-04-20 NOTE — Progress Notes (Signed)
Subjective: Chief Complaint  Patient presents with   nail care     Beth Simon     82 y.o. returns the office today for painful, elongated, thickened toenails which she is unable to trim herself as well as for a callus to her second digit on her left foot.  She states that the toenails do not to be trimmed.  No open lesions.  Objective: AAO 3, NAD DP/PT pulses palpable, CRT less than 3 seconds Chronic swelling present bilaterally.  There is no open lesions. Nails hypertrophic, dystrophic, elongated, brittle, discolored 8. There is tenderness overlying the nails 2-5 bilaterally. There is no surrounding erythema or drainage along the nail sites.  Ingrown toenails present with any signs of infection.  Does appear to be somewhat worse, the last appointment this part of the degree of the ingrown. Hyperkeratotic lesions of the distal aspect left second digit. No underlying ulceration, drainage or other signs of infection.  No other areas of discomfort today. Hammertoes present bilaterally No pain with calf compression, warmth, erythema.  Assessment: Symptomatic onychomycosis, ingrown toenails; hyperkeratotic lesion left foot; lower extremity edema left side worse than right  Plan: -Treatment options including alternatives, risks, complications were discussed -Nails sharply debrided 8 without any complications or bleeding.  Nails have gotten longer short to have missing appointments or getting ingrown.  Monitoring signs or symptoms of infection. -Hyperkeratotic lesion sharply debrided 1 without complication/bleeding.  This is due to hammertoe deformity.  Continue offloading -Monitor for any clinical signs or symptoms of infection and directed to call the office immediately should any occur or go to the ER.  Celesta Gentile, DPM

## 2022-04-30 DIAGNOSIS — R69 Illness, unspecified: Secondary | ICD-10-CM | POA: Diagnosis not present

## 2022-05-16 ENCOUNTER — Ambulatory Visit: Payer: Medicare HMO | Admitting: Podiatry

## 2022-05-16 ENCOUNTER — Other Ambulatory Visit: Payer: Medicare HMO

## 2022-05-19 DIAGNOSIS — R69 Illness, unspecified: Secondary | ICD-10-CM | POA: Diagnosis not present

## 2022-05-28 DIAGNOSIS — I10 Essential (primary) hypertension: Secondary | ICD-10-CM | POA: Diagnosis not present

## 2022-05-28 DIAGNOSIS — K219 Gastro-esophageal reflux disease without esophagitis: Secondary | ICD-10-CM | POA: Diagnosis not present

## 2022-05-28 DIAGNOSIS — E7800 Pure hypercholesterolemia, unspecified: Secondary | ICD-10-CM | POA: Diagnosis not present

## 2022-05-29 DIAGNOSIS — Z780 Asymptomatic menopausal state: Secondary | ICD-10-CM | POA: Diagnosis not present

## 2022-05-29 DIAGNOSIS — M85851 Other specified disorders of bone density and structure, right thigh: Secondary | ICD-10-CM | POA: Diagnosis not present

## 2022-05-29 DIAGNOSIS — M85852 Other specified disorders of bone density and structure, left thigh: Secondary | ICD-10-CM | POA: Diagnosis not present

## 2022-05-29 LAB — HM DEXA SCAN

## 2022-06-02 DIAGNOSIS — R69 Illness, unspecified: Secondary | ICD-10-CM | POA: Diagnosis not present

## 2022-06-05 ENCOUNTER — Ambulatory Visit: Payer: Medicare HMO | Admitting: Podiatry

## 2022-06-05 DIAGNOSIS — B351 Tinea unguium: Secondary | ICD-10-CM

## 2022-06-05 DIAGNOSIS — M79676 Pain in unspecified toe(s): Secondary | ICD-10-CM

## 2022-06-05 DIAGNOSIS — Q828 Other specified congenital malformations of skin: Secondary | ICD-10-CM

## 2022-06-05 NOTE — Progress Notes (Signed)
Subjective: Chief Complaint  Patient presents with   routine foot care     82 y.o. returns the office today for painful, elongated, thickened toenails which she is unable to trim herself as well as for a callus to her second digit on her left foot.  No open lesions.  She gets some pain along the area of plantar left foot which she thinks could be coming from some arthritis.  Objective: AAO 3, NAD DP/PT pulses palpable, CRT less than 3 seconds Chronic swelling present bilaterally.  There is no open lesions. Nails hypertrophic, dystrophic, elongated, brittle, discolored 8. There is tenderness overlying the nails 2-5 bilaterally. There is no surrounding erythema or drainage along the nail sites.  Ingrown toenails present with any signs of infection.  Does appear to be somewhat worse, the last appointment this part of the degree of the ingrown. Hyperkeratotic lesions of the distal aspect left second digit. No underlying ulceration, drainage or other signs of infection.  Mild bunion present left foot.  No significant pain today.  No edema, erythema.  Hammertoes are also present. No pain with calf compression, warmth, erythema.  Assessment: Symptomatic onychomycosis, ingrown toenails; hyperkeratotic lesion left foot  Plan: -Treatment options including alternatives, risks, complications were discussed -Nails sharply debrided 8 without any complications or bleeding.  Nails have gotten longer short to have missing appointments or getting ingrown.  Monitoring signs or symptoms of infection. -Hyperkeratotic lesion sharply debrided 1 without complication/bleeding.  This is due to hammertoe deformity.  Continue offloading -Discussed shoe modifications and offloading for the bunion.  She will be measured for orthotics.  Will likely need to metatarsal pads to help as well as get a good support. -Monitor for any clinical signs or symptoms of infection and directed to call the office immediately should  any occur or go to the ER.  Celesta Gentile, DPM

## 2022-06-09 DIAGNOSIS — R69 Illness, unspecified: Secondary | ICD-10-CM | POA: Diagnosis not present

## 2022-06-16 DIAGNOSIS — R69 Illness, unspecified: Secondary | ICD-10-CM | POA: Diagnosis not present

## 2022-06-23 DIAGNOSIS — R69 Illness, unspecified: Secondary | ICD-10-CM | POA: Diagnosis not present

## 2022-07-03 ENCOUNTER — Ambulatory Visit: Payer: Medicare HMO | Admitting: Podiatry

## 2022-07-03 DIAGNOSIS — Q828 Other specified congenital malformations of skin: Secondary | ICD-10-CM

## 2022-07-03 DIAGNOSIS — M79676 Pain in unspecified toe(s): Secondary | ICD-10-CM

## 2022-07-03 DIAGNOSIS — B351 Tinea unguium: Secondary | ICD-10-CM

## 2022-07-06 NOTE — Progress Notes (Signed)
Subjective: Chief Complaint  Patient presents with   Nail Problem    Nail and callus care     82 y.o. returns the office today for painful, elongated, thickened toenails which she is unable to trim herself.  States the nails get painful to get elongated.  Objective: AAO 3, NAD DP/PT pulses palpable, CRT less than 3 seconds Chronic swelling present bilaterally.  There is no open lesions. Nails hypertrophic, dystrophic, elongated, brittle, discolored 10. There is tenderness overlying the nails 1-5 bilaterally. There is no surrounding erythema or drainage along the nail sites.  Ingrown toenails present with any signs of infection.  Minimal hyperkeratotic lesions of the distal aspect left second digit. No underlying ulceration, drainage or other signs of infection.  Mild bunion present left foot.  No significant pain today.  No edema, erythema.  Hammertoes are also present. No pain with calf compression, warmth, erythema.  Assessment: Symptomatic onychomycosis, ingrown toenails; hyperkeratotic lesion left foot  Plan: -Treatment options including alternatives, risks, complications were discussed -Nails sharply debrided 10 without any complications or bleeding.  Nails have gotten longer short to have missing appointments or getting ingrown.  Monitoring signs or symptoms of infection. -Hyperkeratotic lesion sharply debrided 1 without complication/bleeding.  Continue offloading.-Daily foot inspection  Ovid Curd, DPM

## 2022-07-16 DIAGNOSIS — Z961 Presence of intraocular lens: Secondary | ICD-10-CM | POA: Diagnosis not present

## 2022-07-16 DIAGNOSIS — H04123 Dry eye syndrome of bilateral lacrimal glands: Secondary | ICD-10-CM | POA: Diagnosis not present

## 2022-07-23 DIAGNOSIS — Z79899 Other long term (current) drug therapy: Secondary | ICD-10-CM | POA: Diagnosis not present

## 2022-07-23 DIAGNOSIS — M810 Age-related osteoporosis without current pathological fracture: Secondary | ICD-10-CM | POA: Diagnosis not present

## 2022-07-28 DIAGNOSIS — R69 Illness, unspecified: Secondary | ICD-10-CM | POA: Diagnosis not present

## 2022-07-30 DIAGNOSIS — M810 Age-related osteoporosis without current pathological fracture: Secondary | ICD-10-CM | POA: Diagnosis not present

## 2022-08-02 DIAGNOSIS — I8390 Asymptomatic varicose veins of unspecified lower extremity: Secondary | ICD-10-CM | POA: Diagnosis not present

## 2022-08-02 DIAGNOSIS — Z87892 Personal history of anaphylaxis: Secondary | ICD-10-CM | POA: Diagnosis not present

## 2022-08-02 DIAGNOSIS — M810 Age-related osteoporosis without current pathological fracture: Secondary | ICD-10-CM | POA: Diagnosis not present

## 2022-08-02 DIAGNOSIS — R32 Unspecified urinary incontinence: Secondary | ICD-10-CM | POA: Diagnosis not present

## 2022-08-02 DIAGNOSIS — E785 Hyperlipidemia, unspecified: Secondary | ICD-10-CM | POA: Diagnosis not present

## 2022-08-02 DIAGNOSIS — N182 Chronic kidney disease, stage 2 (mild): Secondary | ICD-10-CM | POA: Diagnosis not present

## 2022-08-02 DIAGNOSIS — K219 Gastro-esophageal reflux disease without esophagitis: Secondary | ICD-10-CM | POA: Diagnosis not present

## 2022-08-02 DIAGNOSIS — I872 Venous insufficiency (chronic) (peripheral): Secondary | ICD-10-CM | POA: Diagnosis not present

## 2022-08-02 DIAGNOSIS — Z853 Personal history of malignant neoplasm of breast: Secondary | ICD-10-CM | POA: Diagnosis not present

## 2022-08-02 DIAGNOSIS — Z8542 Personal history of malignant neoplasm of other parts of uterus: Secondary | ICD-10-CM | POA: Diagnosis not present

## 2022-08-02 DIAGNOSIS — Z888 Allergy status to other drugs, medicaments and biological substances status: Secondary | ICD-10-CM | POA: Diagnosis not present

## 2022-08-02 DIAGNOSIS — J301 Allergic rhinitis due to pollen: Secondary | ICD-10-CM | POA: Diagnosis not present

## 2022-08-28 ENCOUNTER — Ambulatory Visit: Payer: Medicare HMO | Admitting: Podiatry

## 2022-08-28 DIAGNOSIS — B351 Tinea unguium: Secondary | ICD-10-CM

## 2022-08-28 DIAGNOSIS — B353 Tinea pedis: Secondary | ICD-10-CM | POA: Diagnosis not present

## 2022-08-28 DIAGNOSIS — M79676 Pain in unspecified toe(s): Secondary | ICD-10-CM

## 2022-08-28 NOTE — Progress Notes (Signed)
Subjective: Chief Complaint  Patient presents with   Nail Problem     82 y.o. returns the office today for painful, elongated, thickened toenails which she is unable to trim herself.  States the nails get painful to get elongated.  She recently scabietic from [place] and she was doing a lot of walking and her feet got back.  Objective: AAO 3, NAD DP/PT pulses palpable, CRT less than 3 seconds Chronic swelling present bilaterally.  There is no open lesions. Nails hypertrophic, dystrophic, elongated, brittle, discolored 10. There is tenderness overlying the nails 1-5 bilaterally. There is no surrounding erythema or drainage along the nail sites.  Ingrown toenails present with any signs of infection.  Minimal hyperkeratotic lesions of the distal aspect left second digit. No underlying ulceration, drainage or other signs of infection.  Interdigital tinea pedis is present with macerated tissue and dry, peeling, erythematous skin without any drainage or pus or blisters. No pain with calf compression, warmth, erythema.  Assessment: Symptomatic onychomycosis, ingrown toenails; hyperkeratotic lesion left foot; tinea pedis  Plan: -Treatment options including alternatives, risks, complications were discussed -Nails sharply debrided 10 without any complications or bleeding.  Nails have gotten longer short to have missing appointments or getting ingrown.  Monitoring signs or symptoms of infection. -Hyperkeratotic lesion sharply debrided 1 without complication/bleeding as a courtesy as it was minimal today.  Continue offloading. -Lotrisone for tinea pedis  Ovid Curd, DPM

## 2022-09-01 ENCOUNTER — Telehealth: Payer: Self-pay | Admitting: Podiatry

## 2022-09-01 DIAGNOSIS — R69 Illness, unspecified: Secondary | ICD-10-CM | POA: Diagnosis not present

## 2022-09-01 MED ORDER — CLOTRIMAZOLE-BETAMETHASONE 1-0.05 % EX CREA: 1.0000 | TOPICAL_CREAM | Freq: Two times a day (BID) | CUTANEOUS | 1 refills | Status: DC

## 2022-09-01 NOTE — Telephone Encounter (Signed)
Patient called, stating she was in on Friday, but the pharmacy doesn't have her cream that was being prescribed.  I sent in Lotrisone cream to her [facility]

## 2022-09-08 DIAGNOSIS — R69 Illness, unspecified: Secondary | ICD-10-CM | POA: Diagnosis not present

## 2022-09-09 NOTE — Progress Notes (Deleted)
82 y.o. [id number] Widowed Caucasian female here for annual exam.    PCP:     Patient's last menstrual period was 03/31/1988 (approximate).           Sexually active: {yes no:314532}  The current method of family planning is status post hysterectomy.    Exercising: {yes no:314532}  {types:19826} Smoker:  no  Health Maintenance: Pap:  09/04/21 neg, 08/28/20 neg: HR HPV neg History of abnormal Pap:  yes, 2018 Colpo VAIN 1  MMG: bilateral mastectomy Colonoscopy:  2017 polyps, no f/u BMD:   2/7/2  Result  osteopenia TDaP:  08/20/18 Gardasil:   no HIV: NR Hep C: neg Screening Labs:  Hb today: ***, Urine today: ***   reports that she has never smoked. She has never used smokeless tobacco. She reports that she does not drink alcohol and does not use drugs.  Past Medical History:  Diagnosis Date   Adenomatous polyps 01/2016   Anemia    Breast cancer (HCC) 1990   bilateral mastectomy, adenoca breast-left MRM, reconstruction, chemo   Bronchitis last 2 weeks   saw dr Pete Glatter 02-28-2014, he said no antibiotics needed, nonproductive cough   Complication of anesthesia    Cystitis    cytoxen, had once or twice   Family history of breast cancer    History of breast cancer    History of radiation therapy 2/10, 2/12, 2/18, 2/24, 05/29/14   vaginal cuff/ 30 Gy/ 5 fx   Hypertension    Macular degeneration    Neck pain    taking physictal therapy last 2 weeks   Osteoporosis    On Prolia.   PONV (postoperative nausea and vomiting)    Shoulder pain    taking physical therapy for last few weeks   Uterine cancer (HCC) 03/21/2014   MLH1/PMS2 LOH   Uterine fibroid    VAIN I (vaginal intraepithelial neoplasia grade I) 2018   positive HR HPV.   Vasovagal syncopes     Past Surgical History:  Procedure Laterality Date   BREAST IMPLANT REMOVAL  6/94   left breast   BREAST RECONSTRUCTION  1990   left   CATARACT EXTRACTION Bilateral    EXCISION VAGINAL CYST     MASTECTOMY Right 10/08/06    prophylactic   RADICAL MASTECTOMY LND  1990   left, chemo done   ROBOTIC ASSISTED TOTAL HYSTERECTOMY WITH BILATERAL SALPINGO OOPHERECTOMY Bilateral 03/21/2014   Procedure: ROBOTIC ASSISTED TOTAL HYSTERECTOMY WITH BILATERAL SALPINGO OOPHORECTOMY ;  Surgeon: Laurette Schimke, MD;  Location: [facility];  Service: Gynecology;  Laterality: Bilateral;    Current Outpatient Medications  Medication Sig Dispense Refill   atorvastatin (LIPITOR) 10 MG tablet Take 10 mg by mouth daily.     clotrimazole-betamethasone (LOTRISONE) cream Apply 1 Application topically 2 (two) times daily. 30 g 1   Coenzyme Q10 (COQ10 PO) Take 1 tablet by mouth every morning.      Cyanocobalamin (VITAMIN B 12 PO) Take 1 tablet by mouth every morning.      denosumab (PROLIA) 60 MG/ML SOLN injection Inject 60 mg into the skin every 6 (six) months. Administer in upper arm, thigh, or abdomen     Flaxseed, Linseed, (FLAX SEEDS PO) Take 1 scoop by mouth every morning. Ground flaxseed in cereal.     lisinopril (PRINIVIL,ZESTRIL) 5 MG tablet Take 5 mg by mouth daily.     Multiple Vitamins-Minerals (PRESERVISION AREDS) CAPS See admin instructions.     NONFORMULARY OR COMPOUNDED ITEM Prosthetic bra.  Please supply  2 bras.  Refill:  2. 60 each 1   OVER THE COUNTER MEDICATION Take 1 tablet by mouth every morning. Astazanthin. (anti-oxidant)     SHINGRIX injection      VITAMIN D, CHOLECALCIFEROL, PO Take 4,000 Int'l Units by mouth every morning.      No current facility-administered medications for this visit.    Family History  Problem Relation Age of Onset   Breast cancer Sister 85       recurrence age 6   Breast cancer Paternal Grandmother    Heart disease Mother    Heart disease Father    Hypertension Sister    Dementia Sister    Breast cancer Maternal Aunt 65   Dementia Maternal Aunt        dx in her 48s    Review of Systems  Exam:   LMP 03/31/1988 (Approximate)     General appearance: alert, cooperative and appears  stated age Head: normocephalic, without obvious abnormality, atraumatic Neck: no adenopathy, supple, symmetrical, trachea midline and thyroid normal to inspection and palpation Lungs: clear to auscultation bilaterally Breasts: normal appearance, no masses or tenderness, No nipple retraction or dimpling, No nipple discharge or bleeding, No axillary adenopathy Heart: regular rate and rhythm Abdomen: soft, non-tender; no masses, no organomegaly Extremities: extremities normal, atraumatic, no cyanosis or edema Skin: skin color, texture, turgor normal. No rashes or lesions Lymph nodes: cervical, supraclavicular, and axillary nodes normal. Neurologic: grossly normal  Pelvic: External genitalia:  no lesions              No abnormal inguinal nodes palpated.              Urethra:  normal appearing urethra with no masses, tenderness or lesions              Bartholins and Skenes: normal                 Vagina: normal appearing vagina with normal color and discharge, no lesions              Cervix: no lesions              Pap taken: {yes no:314532} Bimanual Exam:  Uterus:  normal size, contour, position, consistency, mobility, non-tender              Adnexa: no mass, fullness, tenderness              Rectal exam: {yes no:314532}.  Confirms.              Anus:  normal sphincter tone, no lesions  Chaperone was present for exam:  ***  Assessment:   Well woman visit with gynecologic exam.   Plan: Mammogram screening discussed. Self breast awareness reviewed. Pap and HR HPV as above. Guidelines for Calcium, Vitamin D, regular exercise program including cardiovascular and weight bearing exercise.   Follow up annually and prn.   Additional counseling given.  {yes [id number]. _______ minutes face to face time of which over 50% was spent in counseling.    After visit summary provided.

## 2022-09-15 DIAGNOSIS — R69 Illness, unspecified: Secondary | ICD-10-CM | POA: Diagnosis not present

## 2022-09-16 DIAGNOSIS — M1711 Unilateral primary osteoarthritis, right knee: Secondary | ICD-10-CM | POA: Diagnosis not present

## 2022-09-18 ENCOUNTER — Telehealth: Payer: Self-pay

## 2022-09-18 DIAGNOSIS — I890 Lymphedema, not elsewhere classified: Secondary | ICD-10-CM

## 2022-09-18 NOTE — Telephone Encounter (Signed)
BS pt calling to inquire if we could send referral for her to [facility] [place] [facility] office for lymphatic massage? Has seen Alvira Monday in the last 1-2 years. However, when calling to try and make appt, they requested a referral.   Last seen/AEX 09/04/2021--scheduled on 09/23/2022.  Please advise.

## 2022-09-18 NOTE — Telephone Encounter (Signed)
Please send the referral in for her.

## 2022-09-19 NOTE — Telephone Encounter (Signed)
Referral sent per JJ. Pt notified. Will route to provider for final review and close.

## 2022-09-21 NOTE — Therapy (Signed)
OUTPATIENT PHYSICAL THERAPY  LOWER EXTREMITY ONCOLOGY EVALUATION  Patient Name: Beth Simon MRN: [id number] DOB:27-Sep-1940, 82 y.o., female Today's Date: 09/22/2022  END OF SESSION:  PT End of Session - 09/22/22 1456     Visit Number 1    Number of Visits 12    Date for PT Re-Evaluation 11/03/22    PT Start Time 1400    PT Stop Time 1445    PT Time Calculation (min) 45 min    Activity Tolerance Patient tolerated treatment well    Behavior During Therapy [facility] for tasks assessed/performed             Past Medical History:  Diagnosis Date   Adenomatous polyps 01/2016   Anemia    Breast cancer (HCC) 1990   bilateral mastectomy, adenoca breast-left MRM, reconstruction, chemo   Bronchitis last 2 weeks   saw dr Pete Glatter 02-28-2014, he said no antibiotics needed, nonproductive cough   Complication of anesthesia    Cystitis    cytoxen, had once or twice   Family history of breast cancer    History of breast cancer    History of radiation therapy 2/10, 2/12, 2/18, 2/24, 05/29/14   vaginal cuff/ 30 Gy/ 5 fx   Hypertension    Macular degeneration    Neck pain    taking physictal therapy last 2 weeks   Osteoporosis    On Prolia.   PONV (postoperative nausea and vomiting)    Shoulder pain    taking physical therapy for last few weeks   Uterine cancer (HCC) 03/21/2014   MLH1/PMS2 LOH   Uterine fibroid    VAIN I (vaginal intraepithelial neoplasia grade I) 2018   positive HR HPV.   Vasovagal syncopes    Past Surgical History:  Procedure Laterality Date   BREAST IMPLANT REMOVAL  6/94   left breast   BREAST RECONSTRUCTION  1990   left   CATARACT EXTRACTION Bilateral    EXCISION VAGINAL CYST     MASTECTOMY Right 10/08/06   prophylactic   RADICAL MASTECTOMY LND  1990   left, chemo done   ROBOTIC ASSISTED TOTAL HYSTERECTOMY WITH BILATERAL SALPINGO OOPHERECTOMY Bilateral 03/21/2014   Procedure: ROBOTIC ASSISTED TOTAL HYSTERECTOMY WITH BILATERAL SALPINGO OOPHORECTOMY ;   Surgeon: Laurette Schimke, MD;  Location: [facility];  Service: Gynecology;  Laterality: Bilateral;   Patient Active Problem List   Diagnosis Date Noted   Age-related osteoporosis without current pathological fracture 03/19/2020   Personal history of colonic polyps 03/19/2020   Transient cerebral ischemia 09/15/2017   Pure hypercholesterolemia 09/15/2017   Pulmonary nodule 09/15/2017   Hypertension 09/15/2017   Gastro-esophageal reflux disease without esophagitis 09/15/2017   VAIN I (vaginal intraepithelial neoplasia grade I) 06/23/2017   Genetic testing 05/23/2014   History of breast cancer    Family history of breast cancer    Endometrial cancer (HCC) 03/21/2014   Uterine cancer (HCC) 03/21/2014   S/P mastectomy, bilateral 05/02/2013   Situational stress 05/02/2013   Osteoporosis, unspecified 05/02/2013   Breast cancer, left breast (HCC) 05/02/2013    PCP: Merlene Laughter, MD  REFERRING PROVIDER: Wyvonnia Lora MD  REFERRING DIAG: LE Lymphedema  THERAPY DIAG:  Lymphedema, not elsewhere classified  ONSET DATE: around 05/2021  Rationale for Evaluation and Treatment: Rehabilitation  SUBJECTIVE:  SUBJECTIVE STATEMENT:  Pt returns due to swelling in her legs, left greater than right. It comes and goes.. This morning it  seems to be down. The socks we ordered online were really good, but hot,but I ordered some online at [facility] that were thinner. When I was in [place] we did a great deal of walking. I have done my MLD occasionally and do both legs but I feel I need a refresher.. She had an injection in her right knee last Tuesday with Kenalog and she had some strange side effects; it made me feel weak in my arms and legs and caused some tingling in my arms and legs. The sensation is still there but not as severe. I  have been inactive today so my legs look pretty good. Since the injection last week and feeling funny I have not done much. (Pt advised to follow up with MD about symptoms she is still having) She will be starting PT at [facility] for Right knee pain.  PERTINENT HISTORY:  History notable for left breast cancer ER-PR-  treated with radical mastectomy and chemotherapy in 1990.  Subsequently underwent right simple mastectomy 2008 without reconstruction. Denies use of tamoxifen  On 03/21/14  she underwent a robotic hysterectomy for treatment of uterine cancer, BSO. Frozen section revealed grade 1 cancer. Final pathology revealed a grade 2 lesion with LVSI present and she was staged at stage IA. She was recommended vaginal brachytherapy as adjuvant therapy to reduce risk for local recurrence. Dr Karoline Caldwell delivered vaginal brachytherapy completed on 05/29/14. Her treatments were without complication. Pt had therapy from 11/12/21 to 01/08/22 for bilateral LE swelling. PAIN:  Are you having pain? No  PRECAUTIONS: Bilateral LE lymphedema,s/p Left breast cancer  WEIGHT BEARING RESTRICTIONS: No  FALLS:  Has patient fallen in last 6 months? No  LIVING ENVIRONMENT: Lives with: lives alone Lives in: House/apartment Stairs: No;  Has following equipment at home: None  OCCUPATION: Retired  LEISURE: walks, Pilates with Roanna Raider  PRIOR LEVEL OF FUNCTION: Independent  PATIENT GOALS: Review self MLD,check present compression garments and purchase new prn,    OBJECTIVE:  COGNITION: Overall cognitive status: Within functional limits for tasks assessed   PALPATION: Tenderness left lower leg with light palpation, not so on right. Very mild pitting bilaterally mostly foot/ankle  OBSERVATIONS / OTHER ASSESSMENTS: multiple varicosities noted bilateral LE's  SENSATION: Light touch: Appears intact  POSTURE: Forward head rounded shoulders  LYMPHEDEMA ASSESSMENTS:   SURGERY TYPE/DATE: uterine  cancer with total hysterectomy in 2016, 1990 L mastectomy and ALND, 2008 R mastectomy   NUMBER OF LYMPH NODES REMOVED: 0  CHEMOTHERAPY: completed for L breast cancer   RADIATION:completed  HORMONE TREATMENT: NONE  INFECTIONS: NONE  LYMPHEDEMA ASSESSMENTS:   LOWER EXTREMITY LANDMARK RIGHT eval  At groin   30 cm proximal to suprapatella   20 cm proximal to suprapatella   10 cm proximal to suprapatella   At midpatella / popliteal crease   30 cm proximal to floor at lateral plantar foot 36  20 cm proximal to floor at lateral plantar foot 28.2  10 cm proximal to floor at lateral plantar foot 22.0  Circumference of ankle/heel   5 cm proximal to 1st MTP joint 20.3  Across MTP joint 20.5  Around proximal great toe 7  (Blank rows = not tested)  LOWER EXTREMITY LANDMARK LEFT eval  At groin   30 cm proximal to suprapatella   20 cm proximal to suprapatella   10 cm proximal to  suprapatella   At midpatella / popliteal crease   30 cm proximal to floor at lateral plantar foot 36.6  20 cm proximal to floor at lateral plantar foot 29.1  10 cm proximal to floor at lateral plantar foot 23.2  Circumference of ankle/heel   5 cm proximal to 1st MTP joint 20.7  Across MTP joint 20.5  Around proximal great toe 7.3  (Blank rows = not tested)  GAIT: Distance walked: 40 ft Assistive device utilized: None Level of assistance: Complete Independence Comments: normal gait, no difficulties  Outcome measure:  TODAY'S TREATMENT:                                                                                                                                          DATE: 09/22/2022 Initiated MLD to left lower extremity; short neck, 5 breaths, left inguinal LN's, Lateral knee to thigh, then inner thigh to outer, repeating lateral thigh, then ankle to knee repeating both sides of leg , retracing steps  lateral thigh to hip, then left foot retracing all steps and ending with LN's at left  groin.  PATIENT EDUCATION:  Education details: POC, bring garments next discussed legs will swell if not wearing compression Person educated: Patient Education method: Explanation and Demonstration Education comprehension: verbalized understanding, requires further education  HOME EXERCISE PROGRAM: None given today  ASSESSMENT:  CLINICAL IMPRESSION: Patient is a 81 y.o. female who was seen today for physical therapy evaluation and treatment for bilateral LE lymphedema. Pt underwent a complete hysterectomy  and radiation therapy for treatment of uterine cancer in 2016. Pt reports her swelling began worsening around Feb 2023. She had prior therapy from August 15/2023 to 01/08/2022.  She notices an increase in swelling more recently, although her legs look very good today as she has been off her feet a lot over the last week.She would benefit from skilled PT services to decrease bilateral LE lymphedema prn, perform MLD and review with pt for self MLD, and assist pt with obtaining appropriate compression garments prn.    .   OBJECTIVE IMPAIRMENTS: decreased knowledge of condition, decreased knowledge of use of DME, and increased edema.   ACTIVITY LIMITATIONS:  no limitations but legs swell more with being on feet for prolong periods or sitting prolonged periods  PARTICIPATION LIMITATIONS:  none  PERSONAL FACTORS:  none  are also affecting patient's functional outcome.   REHAB POTENTIAL: Excellent  CLINICAL DECISION MAKING: Stable/uncomplicated  EVALUATION COMPLEXITY: Low   GOALS: Goals reviewed with patient? Yes  SHORT TERM GOALS=LONG TERM GOALS: Target date: 10/20/2022     Pt will be independent in self MLD for long term management of lymphedema.  Baseline:  Goal status: INITIAL  2. Pt will obtain have/obtain appropriate compression garments for long term management of lymphedema.  Baseline: Goal status: INITIAL   3.  Pt will be more compliant with compression garments to  maintain edema reduction Baseline:  Goal status: INITIAL    PLAN:  PT FREQUENCY: 2x/week PT DURATION: 4 weeks   PLANNED INTERVENTIONS: Therapeutic exercises, Patient/Family education, Self Care, Orthotic/Fit training, DME instructions, Manual lymph drainage, Compression bandaging, Vasopneumatic device, and Manual therapy   PLAN FOR NEXT SESSION: Is pt still having symptoms she relates to Kenalog injection? Did she see MD? begin MLD to bilateral LE and instruct/review self MLD technique, look at old compression garments and determine if she needs new ones. Encourage compliance with compression.   Waynette Buttery, PT 09/22/2022, 4:28 PM

## 2022-09-22 ENCOUNTER — Ambulatory Visit: Payer: Medicare HMO | Attending: Obstetrics and Gynecology

## 2022-09-22 ENCOUNTER — Other Ambulatory Visit: Payer: Self-pay

## 2022-09-22 DIAGNOSIS — I890 Lymphedema, not elsewhere classified: Secondary | ICD-10-CM | POA: Diagnosis not present

## 2022-09-22 DIAGNOSIS — R69 Illness, unspecified: Secondary | ICD-10-CM | POA: Diagnosis not present

## 2022-09-23 ENCOUNTER — Ambulatory Visit: Payer: Medicare HMO | Admitting: Obstetrics and Gynecology

## 2022-09-24 ENCOUNTER — Ambulatory Visit: Payer: Medicare HMO

## 2022-09-24 DIAGNOSIS — I890 Lymphedema, not elsewhere classified: Secondary | ICD-10-CM

## 2022-09-24 DIAGNOSIS — M1711 Unilateral primary osteoarthritis, right knee: Secondary | ICD-10-CM | POA: Diagnosis not present

## 2022-09-24 NOTE — Therapy (Signed)
OUTPATIENT PHYSICAL THERAPY  LOWER EXTREMITY ONCOLOGY EVALUATION  Patient Name: Beth Simon MRN: [id number] DOB:Aug 13, 1940, 82 y.o., female Today's Date: 09/24/2022  END OF SESSION:  PT End of Session - 09/24/22 1400     Visit Number 2    Number of Visits 12    Date for PT Re-Evaluation 11/03/22    PT Start Time 1403    PT Stop Time 1453    PT Time Calculation (min) 50 min    Activity Tolerance Patient tolerated treatment well    Behavior During Therapy [facility] for tasks assessed/performed             Past Medical History:  Diagnosis Date   Adenomatous polyps 01/2016   Anemia    Breast cancer (HCC) 1990   bilateral mastectomy, adenoca breast-left MRM, reconstruction, chemo   Bronchitis last 2 weeks   saw dr Pete Glatter 02-28-2014, he said no antibiotics needed, nonproductive cough   Complication of anesthesia    Cystitis    cytoxen, had once or twice   Family history of breast cancer    History of breast cancer    History of radiation therapy 2/10, 2/12, 2/18, 2/24, 05/29/14   vaginal cuff/ 30 Gy/ 5 fx   Hypertension    Macular degeneration    Neck pain    taking physictal therapy last 2 weeks   Osteoporosis    On Prolia.   PONV (postoperative nausea and vomiting)    Shoulder pain    taking physical therapy for last few weeks   Uterine cancer (HCC) 03/21/2014   MLH1/PMS2 LOH   Uterine fibroid    VAIN I (vaginal intraepithelial neoplasia grade I) 2018   positive HR HPV.   Vasovagal syncopes    Past Surgical History:  Procedure Laterality Date   BREAST IMPLANT REMOVAL  6/94   left breast   BREAST RECONSTRUCTION  1990   left   CATARACT EXTRACTION Bilateral    EXCISION VAGINAL CYST     MASTECTOMY Right 10/08/06   prophylactic   RADICAL MASTECTOMY LND  1990   left, chemo done   ROBOTIC ASSISTED TOTAL HYSTERECTOMY WITH BILATERAL SALPINGO OOPHERECTOMY Bilateral 03/21/2014   Procedure: ROBOTIC ASSISTED TOTAL HYSTERECTOMY WITH BILATERAL SALPINGO OOPHORECTOMY ;   Surgeon: Laurette Schimke, MD;  Location: [facility];  Service: Gynecology;  Laterality: Bilateral;   Patient Active Problem List   Diagnosis Date Noted   Age-related osteoporosis without current pathological fracture 03/19/2020   Personal history of colonic polyps 03/19/2020   Transient cerebral ischemia 09/15/2017   Pure hypercholesterolemia 09/15/2017   Pulmonary nodule 09/15/2017   Hypertension 09/15/2017   Gastro-esophageal reflux disease without esophagitis 09/15/2017   VAIN I (vaginal intraepithelial neoplasia grade I) 06/23/2017   Genetic testing 05/23/2014   History of breast cancer    Family history of breast cancer    Endometrial cancer (HCC) 03/21/2014   Uterine cancer (HCC) 03/21/2014   S/P mastectomy, bilateral 05/02/2013   Situational stress 05/02/2013   Osteoporosis, unspecified 05/02/2013   Breast cancer, left breast (HCC) 05/02/2013    PCP: Merlene Laughter, MD  REFERRING PROVIDER: Wyvonnia Lora MD  REFERRING DIAG: LE Lymphedema  THERAPY DIAG:  Lymphedema, not elsewhere classified  ONSET DATE: around 05/2021  Rationale for Evaluation and Treatment: Rehabilitation  SUBJECTIVE:  SUBJECTIVE STATEMENT:  09/24/22 I have a lot of appts today and I forgot some of my different stockings. They are 8-15 mmhg  09/22/2022 EVAL Pt returns due to swelling in her legs, left greater than right. It comes and goes.. This morning it  seems to be down. The socks we ordered online were really good, but hot,but I ordered some online at [facility] that were thinner. When I was in [place] we did a great deal of walking. I have done my MLD occasionally and do both legs but I feel I need a refresher.. She had an injection in her right knee last Tuesday with Kenalog and she had some strange side effects; it made me feel  weak in my arms and legs and caused some tingling in my arms and legs. The sensation is still there but not as severe. I have been inactive today so my legs look pretty good. Since the injection last week and feeling funny I have not done much. (Pt advised to follow up with MD about symptoms she is still having) She will be starting PT at [facility] for Right knee pain.  PERTINENT HISTORY:  History notable for left breast cancer ER-PR-  treated with radical mastectomy and chemotherapy in 1990.  Subsequently underwent right simple mastectomy 2008 without reconstruction. Denies use of tamoxifen  On 03/21/14  she underwent a robotic hysterectomy for treatment of uterine cancer, BSO. Frozen section revealed grade 1 cancer. Final pathology revealed a grade 2 lesion with LVSI present and she was staged at stage IA. She was recommended vaginal brachytherapy as adjuvant therapy to reduce risk for local recurrence. Dr Karoline Caldwell delivered vaginal brachytherapy completed on 05/29/14. Her treatments were without complication. Pt had therapy from 11/12/21 to 01/08/22 for bilateral LE swelling. PAIN:  Are you having pain? No  PRECAUTIONS: Bilateral LE lymphedema,s/p Left breast cancer  WEIGHT BEARING RESTRICTIONS: No  FALLS:  Has patient fallen in last 6 months? No  LIVING ENVIRONMENT: Lives with: lives alone Lives in: House/apartment Stairs: No;  Has following equipment at home: None  OCCUPATION: Retired  LEISURE: walks, Pilates with Roanna Raider  PRIOR LEVEL OF FUNCTION: Independent  PATIENT GOALS: Review self MLD,check present compression garments and purchase new prn,    OBJECTIVE:  COGNITION: Overall cognitive status: Within functional limits for tasks assessed   PALPATION: Tenderness left lower leg with light palpation, not so on right. Very mild pitting bilaterally mostly foot/ankle  OBSERVATIONS / OTHER ASSESSMENTS: multiple varicosities noted bilateral LE's  SENSATION: Light  touch: Appears intact  POSTURE: Forward head rounded shoulders  LYMPHEDEMA ASSESSMENTS:   SURGERY TYPE/DATE: uterine cancer with total hysterectomy in 2016, 1990 L mastectomy and ALND, 2008 R mastectomy   NUMBER OF LYMPH NODES REMOVED: 0  CHEMOTHERAPY: completed for L breast cancer   RADIATION:completed  HORMONE TREATMENT: NONE  INFECTIONS: NONE  LYMPHEDEMA ASSESSMENTS:   LOWER EXTREMITY LANDMARK RIGHT eval  At groin   30 cm proximal to suprapatella   20 cm proximal to suprapatella   10 cm proximal to suprapatella   At midpatella / popliteal crease   30 cm proximal to floor at lateral plantar foot 36  20 cm proximal to floor at lateral plantar foot 28.2  10 cm proximal to floor at lateral plantar foot 22.0  Circumference of ankle/heel   5 cm proximal to 1st MTP joint 20.3  Across MTP joint 20.5  Around proximal great toe 7  (Blank rows = not tested)  LOWER EXTREMITY LANDMARK LEFT eval  At groin   30 cm proximal to suprapatella   20 cm proximal to suprapatella   10 cm proximal to suprapatella   At midpatella / popliteal crease   30 cm proximal to floor at lateral plantar foot 36.6  20 cm proximal to floor at lateral plantar foot 29.1  10 cm proximal to floor at lateral plantar foot 23.2  Circumference of ankle/heel   5 cm proximal to 1st MTP joint 20.7  Across MTP joint 20.5  Around proximal great toe 7.3  (Blank rows = not tested)  GAIT: Distance walked: 40 ft Assistive device utilized: None Level of assistance: Complete Independence Comments: normal gait, no difficulties  Outcome measure:  TODAY'S TREATMENT:                                                                                                                                          DATE:  09/24/22  Pt removed compression stockings; wearing [employer] knee high compression 8-15 mmhg, and legs look OK today. Pt was able to don and doff independently MLD: In Supine with HOB elevated and legs elevated  on large bolster and smaller round bolster: Short neck, 5 diaphragmatic breaths, Lt inguinal nodes, (no anastomosis due to Lt ALND in 1990); and then Lt LE working from proximal to distal, then retracing all steps (with intact sequence) and ending with LN's  MLD to the right LE using the right axillary Lns. Therapist then activated right LN's and had pt demonstrate each of the steps including axillary LN's,right  inguinal-axillary pathway and Right LE lateral thigh, then medial to lateral retracing steps, knee and lower leg retracing steps then right foot retracing steps and ending with LN"S.Pt practiced each step on the right and needs moderate VC's and visual cues.  6/24/24MLD: In Supine with HOB elevated and legs elevated on large bolster with pillows under knees: Short neck, 5 diaphragmatic breaths, Lt inguinal nodes, (no anastomosis due to Lt ALND in 1990); and then Lt LE working from proximal to distal, then retracing all steps (with intact sequence) but will continue intact for remaining sessions for Lt LE) /24/2024 Initiated MLD to left lower extremity; short neck, 5 breaths, left inguinal LN's, Lateral knee to thigh, then inner thigh to outer, repeating lateral thigh, then ankle to knee repeating both sides of leg , retracing steps  lateral thigh to hip, then left foot retracing all steps and ending with LN's at left groin.  PATIENT EDUCATION:  Education details: POC, bring garments next discussed legs will swell if not wearing compression Person educated: Patient Education method: Explanation and Demonstration Education comprehension: verbalized understanding, requires further education  HOME EXERCISE PROGRAM: None given today  ASSESSMENT:  CLINICAL IMPRESSION: Continued MLD and initiated pt practice of MLD on right LE in prop sitting on treatment table. Pt did well overall but required practice of technique for stretch and direction. It was  lsightly awkward for her on the table with head  of table elevated so will try with pt seated in chair and leg on table.  .   OBJECTIVE IMPAIRMENTS: decreased knowledge of condition, decreased knowledge of use of DME, and increased edema.   ACTIVITY LIMITATIONS:  no limitations but legs swell more with being on feet for prolong periods or sitting prolonged periods  PARTICIPATION LIMITATIONS:  none  PERSONAL FACTORS:  none  are also affecting patient's functional outcome.   REHAB POTENTIAL: Excellent  CLINICAL DECISION MAKING: Stable/uncomplicated  EVALUATION COMPLEXITY: Low   GOALS: Goals reviewed with patient? Yes  SHORT TERM GOALS=LONG TERM GOALS: Target date: 10/20/2022     Pt will be independent in self MLD for long term management of lymphedema.  Baseline:  Goal status: INITIAL  2. Pt will obtain have/obtain appropriate compression garments for long term management of lymphedema.  Baseline: Goal status: INITIAL   3.  Pt will be more compliant with compression garments to maintain edema reduction Baseline:  Goal status: INITIAL    PLAN:  PT FREQUENCY: 2x/week PT DURATION: 4 weeks   PLANNED INTERVENTIONS: Therapeutic exercises, Patient/Family education, Self Care, Orthotic/Fit training, DME instructions, Manual lymph drainage, Compression bandaging, Vasopneumatic device, and Manual therapy   PLAN FOR NEXT SESSION: Is pt still having symptoms she relates to Kenalog injection? Did she see MD? begin MLD to bilateral LE and instruct/review self MLD technique, look at old compression garments and determine if she needs new ones. Encourage compliance with compression.   Waynette Buttery, PT 09/24/2022, 2:54 PM

## 2022-09-30 ENCOUNTER — Ambulatory Visit: Payer: Medicare HMO | Attending: Obstetrics and Gynecology

## 2022-09-30 DIAGNOSIS — I890 Lymphedema, not elsewhere classified: Secondary | ICD-10-CM | POA: Diagnosis not present

## 2022-09-30 DIAGNOSIS — Z8542 Personal history of malignant neoplasm of other parts of uterus: Secondary | ICD-10-CM | POA: Insufficient documentation

## 2022-09-30 NOTE — Therapy (Signed)
OUTPATIENT PHYSICAL THERAPY  LOWER EXTREMITY ONCOLOGY EVALUATION  Patient Name: Beth Simon MRN: [id number] DOB:Feb 02, 1941, 82 y.o., female Today's Date: 09/30/2022  END OF SESSION:  PT End of Session - 09/30/22 1200     Visit Number 3    Number of Visits 12    Date for PT Re-Evaluation 11/03/22    PT Start Time 1200    PT Stop Time 1252    PT Time Calculation (min) 52 min    Activity Tolerance Patient tolerated treatment well    Behavior During Therapy [facility] for tasks assessed/performed             Past Medical History:  Diagnosis Date   Adenomatous polyps 01/2016   Anemia    Breast cancer (HCC) 1990   bilateral mastectomy, adenoca breast-left MRM, reconstruction, chemo   Bronchitis last 2 weeks   saw dr Pete Glatter 02-28-2014, he said no antibiotics needed, nonproductive cough   Complication of anesthesia    Cystitis    cytoxen, had once or twice   Family history of breast cancer    History of breast cancer    History of radiation therapy 2/10, 2/12, 2/18, 2/24, 05/29/14   vaginal cuff/ 30 Gy/ 5 fx   Hypertension    Macular degeneration    Neck pain    taking physictal therapy last 2 weeks   Osteoporosis    On Prolia.   PONV (postoperative nausea and vomiting)    Shoulder pain    taking physical therapy for last few weeks   Uterine cancer (HCC) 03/21/2014   MLH1/PMS2 LOH   Uterine fibroid    VAIN I (vaginal intraepithelial neoplasia grade I) 2018   positive HR HPV.   Vasovagal syncopes    Past Surgical History:  Procedure Laterality Date   BREAST IMPLANT REMOVAL  6/94   left breast   BREAST RECONSTRUCTION  1990   left   CATARACT EXTRACTION Bilateral    EXCISION VAGINAL CYST     MASTECTOMY Right 10/08/06   prophylactic   RADICAL MASTECTOMY LND  1990   left, chemo done   ROBOTIC ASSISTED TOTAL HYSTERECTOMY WITH BILATERAL SALPINGO OOPHERECTOMY Bilateral 03/21/2014   Procedure: ROBOTIC ASSISTED TOTAL HYSTERECTOMY WITH BILATERAL SALPINGO OOPHORECTOMY ;   Surgeon: Laurette Schimke, MD;  Location: [facility];  Service: Gynecology;  Laterality: Bilateral;   Patient Active Problem List   Diagnosis Date Noted   Age-related osteoporosis without current pathological fracture 03/19/2020   Personal history of colonic polyps 03/19/2020   Transient cerebral ischemia 09/15/2017   Pure hypercholesterolemia 09/15/2017   Pulmonary nodule 09/15/2017   Hypertension 09/15/2017   Gastro-esophageal reflux disease without esophagitis 09/15/2017   VAIN I (vaginal intraepithelial neoplasia grade I) 06/23/2017   Genetic testing 05/23/2014   History of breast cancer    Family history of breast cancer    Endometrial cancer (HCC) 03/21/2014   Uterine cancer (HCC) 03/21/2014   S/P mastectomy, bilateral 05/02/2013   Situational stress 05/02/2013   Osteoporosis, unspecified 05/02/2013   Breast cancer, left breast (HCC) 05/02/2013    PCP: Merlene Laughter, MD  REFERRING PROVIDER: Wyvonnia Lora MD  REFERRING DIAG: LE Lymphedema  THERAPY DIAG:  Lymphedema, not elsewhere classified  History of uterine cancer  ONSET DATE: around 05/2021  Rationale for Evaluation and Treatment: Rehabilitation  SUBJECTIVE:  SUBJECTIVE STATEMENT:   09/30/2022 I am still feeling very weak and shaky from the Kenalog 2 weeks ago. I Have looked things up and it is a side effect but I think I should see my regular doctor. I spoke with Dr. Austin Miles PA several times but she didn't know anything about it. I tried the MLD at home on Sunday, but it is a little awkward on myself.   09/22/2022 EVAL Pt returns due to swelling in her legs, left greater than right. It comes and goes.. This morning it  seems to be down. The socks we ordered online were really good, but hot,but I ordered some online at [facility] that were  thinner. When I was in [place] we did a great deal of walking. I have done my MLD occasionally and do both legs but I feel I need a refresher.. She had an injection in her right knee last Tuesday with Kenalog and she had some strange side effects; it made me feel weak in my arms and legs and caused some tingling in my arms and legs. The sensation is still there but not as severe. I have been inactive today so my legs look pretty good. Since the injection last week and feeling funny I have not done much. (Pt advised to follow up with MD about symptoms she is still having) She will be starting PT at [facility] for Right knee pain.  PERTINENT HISTORY:  History notable for left breast cancer ER-PR-  treated with radical mastectomy and chemotherapy in 1990.  Subsequently underwent right simple mastectomy 2008 without reconstruction. Denies use of tamoxifen  On 03/21/14  she underwent a robotic hysterectomy for treatment of uterine cancer, BSO. Frozen section revealed grade 1 cancer. Final pathology revealed a grade 2 lesion with LVSI present and she was staged at stage IA. She was recommended vaginal brachytherapy as adjuvant therapy to reduce risk for local recurrence. Dr Karoline Caldwell delivered vaginal brachytherapy completed on 05/29/14. Her treatments were without complication. Pt had therapy from 11/12/21 to 01/08/22 for bilateral LE swelling. PAIN:  Are you having pain? No  PRECAUTIONS: Bilateral LE lymphedema,s/p Left breast cancer  WEIGHT BEARING RESTRICTIONS: No  FALLS:  Has patient fallen in last 6 months? No  LIVING ENVIRONMENT: Lives with: lives alone Lives in: House/apartment Stairs: No;  Has following equipment at home: None  OCCUPATION: Retired  LEISURE: walks, Pilates with Roanna Raider  PRIOR LEVEL OF FUNCTION: Independent  PATIENT GOALS: Review self MLD,check present compression garments and purchase new prn,    OBJECTIVE:  COGNITION: Overall cognitive status: Within  functional limits for tasks assessed   PALPATION: Tenderness left lower leg with light palpation, not so on right. Very mild pitting bilaterally mostly foot/ankle  OBSERVATIONS / OTHER ASSESSMENTS: multiple varicosities noted bilateral LE's  SENSATION: Light touch: Appears intact  POSTURE: Forward head rounded shoulders  LYMPHEDEMA ASSESSMENTS:   SURGERY TYPE/DATE: uterine cancer with total hysterectomy in 2016, 1990 L mastectomy and ALND, 2008 R mastectomy   NUMBER OF LYMPH NODES REMOVED: 0  CHEMOTHERAPY: completed for L breast cancer   RADIATION:completed  HORMONE TREATMENT: NONE  INFECTIONS: NONE  LYMPHEDEMA ASSESSMENTS:   LOWER EXTREMITY LANDMARK RIGHT eval  At groin   30 cm proximal to suprapatella   20 cm proximal to suprapatella   10 cm proximal to suprapatella   At midpatella / popliteal crease   30 cm proximal to floor at lateral plantar foot 36  20 cm proximal to floor at lateral plantar foot 28.2  10 cm proximal to floor at lateral plantar foot 22.0  Circumference of ankle/heel   5 cm proximal to 1st MTP joint 20.3  Across MTP joint 20.5  Around proximal great toe 7  (Blank rows = not tested)  LOWER EXTREMITY LANDMARK LEFT eval  At groin   30 cm proximal to suprapatella   20 cm proximal to suprapatella   10 cm proximal to suprapatella   At midpatella / popliteal crease   30 cm proximal to floor at lateral plantar foot 36.6  20 cm proximal to floor at lateral plantar foot 29.1  10 cm proximal to floor at lateral plantar foot 23.2  Circumference of ankle/heel   5 cm proximal to 1st MTP joint 20.7  Across MTP joint 20.5  Around proximal great toe 7.3  (Blank rows = not tested)  GAIT: Distance walked: 40 ft Assistive device utilized: None Level of assistance: Complete Independence Comments: normal gait, no difficulties  Outcome measure:  TODAY'S TREATMENT:                                                                                                                                           DATE: 09/30/2022  Pt brought her Juzo 15-20 compression socks with her but they are too warm for this time of year per pt. Removed her 8-15 MM Hg stockings and her legs look good MLD: In Supine with HOB elevated and legs elevated on large bolster and smaller round bolster: Short neck, 5 diaphragmatic breaths, Lt inguinal nodes, (no anastomosis due to Lt ALND in 1990); and then Lt LE working from proximal to distal, then retracing all steps (with intact sequence) and ending with LN's  MLD to the right LE using the right axillary Lns. Therapist then activated right LN's and had pt demonstrate each of the steps including axillary LN's,right  inguinal-axillary pathway and Right LE lateral thigh, then medial to lateral retracing steps, knee and lower leg retracing steps then right foot retracing steps and ending with LN"S.   09/24/22  Pt removed compression stockings; wearing [employer] knee high compression 8-15 mmhg, and legs look OK today. Pt was able to don and doff independently MLD: In Supine with HOB elevated and legs elevated on large bolster and smaller round bolster: Short neck, 5 diaphragmatic breaths, Lt inguinal nodes, (no anastomosis due to Lt ALND in 1990); and then Lt LE working from proximal to distal, then retracing all steps (with intact sequence) and ending with LN's  MLD to the right LE using the right axillary Lns. Therapist then activated right LN's and had pt demonstrate each of the steps including axillary LN's,right  inguinal-axillary pathway and Right LE lateral thigh, then medial to lateral retracing steps, knee and lower leg retracing steps then right foot retracing steps and ending with LN"S.Pt practiced each step on the right and needs moderate VC's and visual cues.  6/24/24MLD: In Supine with HOB elevated and legs elevated on large bolster with pillows under knees: Short neck, 5 diaphragmatic breaths, Lt inguinal nodes, (no anastomosis  due to Lt ALND in 1990); and then Lt LE working from proximal to distal, then retracing all steps (with intact sequence) but will continue intact for remaining sessions for Lt LE) /24/2024 Initiated MLD to left lower extremity; short neck, 5 breaths, left inguinal LN's, Lateral knee to thigh, then inner thigh to outer, repeating lateral thigh, then ankle to knee repeating both sides of leg , retracing steps  lateral thigh to hip, then left foot retracing all steps and ending with LN's at left groin.  PATIENT EDUCATION:  Education details: POC, bring garments next discussed legs will swell if not wearing compression Person educated: Patient Education method: Explanation and Demonstration Education comprehension: verbalized understanding, requires further education  HOME EXERCISE PROGRAM: None given today  ASSESSMENT:  CLINICAL IMPRESSION:  Pt was feeling weak today possibly from prior kenalog injection in her knee but was advised to contact her primary care MD and try to be seen. MLD performed by PT today as pt was not feeling great. She believed she could feel the fluid moving in her legs with the MLD. Lighter compression stockings seem to be maintaining her LE lymphedema. OBJECTIVE IMPAIRMENTS: decreased knowledge of condition, decreased knowledge of use of DME, and increased edema.   ACTIVITY LIMITATIONS:  no limitations but legs swell more with being on feet for prolong periods or sitting prolonged periods  PARTICIPATION LIMITATIONS:  none  PERSONAL FACTORS:  none  are also affecting patient's functional outcome.   REHAB POTENTIAL: Excellent  CLINICAL DECISION MAKING: Stable/uncomplicated  EVALUATION COMPLEXITY: Low   GOALS: Goals reviewed with patient? Yes  SHORT TERM GOALS=LONG TERM GOALS: Target date: 10/20/2022     Pt will be independent in self MLD for long term management of lymphedema.  Baseline:  Goal status: INITIAL  2. Pt will obtain have/obtain appropriate  compression garments for long term management of lymphedema.  Baseline: Goal status: INITIAL   3.  Pt will be more compliant with compression garments to maintain edema reduction Baseline:  Goal status: INITIAL    PLAN:  PT FREQUENCY: 2x/week PT DURATION: 4 weeks   PLANNED INTERVENTIONS: Therapeutic exercises, Patient/Family education, Self Care, Orthotic/Fit training, DME instructions, Manual lymph drainage, Compression bandaging, Vasopneumatic device, and Manual therapy   PLAN FOR NEXT SESSION: Is pt still having symptoms she relates to Kenalog injection? Did she see MD? begin MLD to bilateral LE and instruct/review self MLD technique, look at old compression garments and determine if she needs new ones. Encourage compliance with compression.   Waynette Buttery, PT 09/30/2022, 1:04 PM

## 2022-10-01 DIAGNOSIS — M1711 Unilateral primary osteoarthritis, right knee: Secondary | ICD-10-CM | POA: Diagnosis not present

## 2022-10-01 DIAGNOSIS — M791 Myalgia, unspecified site: Secondary | ICD-10-CM | POA: Diagnosis not present

## 2022-10-01 DIAGNOSIS — Z0000 Encounter for general adult medical examination without abnormal findings: Secondary | ICD-10-CM | POA: Diagnosis not present

## 2022-10-01 DIAGNOSIS — I10 Essential (primary) hypertension: Secondary | ICD-10-CM | POA: Diagnosis not present

## 2022-10-01 DIAGNOSIS — T887XXA Unspecified adverse effect of drug or medicament, initial encounter: Secondary | ICD-10-CM | POA: Diagnosis not present

## 2022-10-06 DIAGNOSIS — R69 Illness, unspecified: Secondary | ICD-10-CM | POA: Diagnosis not present

## 2022-10-07 ENCOUNTER — Ambulatory Visit: Payer: Medicare HMO

## 2022-10-07 DIAGNOSIS — Z8542 Personal history of malignant neoplasm of other parts of uterus: Secondary | ICD-10-CM | POA: Diagnosis not present

## 2022-10-07 DIAGNOSIS — I890 Lymphedema, not elsewhere classified: Secondary | ICD-10-CM | POA: Diagnosis not present

## 2022-10-07 NOTE — Therapy (Signed)
OUTPATIENT PHYSICAL THERAPY  LOWER EXTREMITY ONCOLOGY EVALUATION  Patient Name: Beth Simon MRN: [id number] DOB:January 04, 1941, 82 y.o., female Today's Date: 10/07/2022  END OF SESSION:  PT End of Session - 10/07/22 1402     Visit Number 4    Number of Visits 12    Date for PT Re-Evaluation 11/03/22    PT Start Time 1403    PT Stop Time 1454    PT Time Calculation (min) 51 min    Activity Tolerance Patient tolerated treatment well    Behavior During Therapy [facility] for tasks assessed/performed             Past Medical History:  Diagnosis Date   Adenomatous polyps 01/2016   Anemia    Breast cancer (HCC) 1990   bilateral mastectomy, adenoca breast-left MRM, reconstruction, chemo   Bronchitis last 2 weeks   saw dr Pete Glatter 02-28-2014, he said no antibiotics needed, nonproductive cough   Complication of anesthesia    Cystitis    cytoxen, had once or twice   Family history of breast cancer    History of breast cancer    History of radiation therapy 2/10, 2/12, 2/18, 2/24, 05/29/14   vaginal cuff/ 30 Gy/ 5 fx   Hypertension    Macular degeneration    Neck pain    taking physictal therapy last 2 weeks   Osteoporosis    On Prolia.   PONV (postoperative nausea and vomiting)    Shoulder pain    taking physical therapy for last few weeks   Uterine cancer (HCC) 03/21/2014   MLH1/PMS2 LOH   Uterine fibroid    VAIN I (vaginal intraepithelial neoplasia grade I) 2018   positive HR HPV.   Vasovagal syncopes    Past Surgical History:  Procedure Laterality Date   BREAST IMPLANT REMOVAL  6/94   left breast   BREAST RECONSTRUCTION  1990   left   CATARACT EXTRACTION Bilateral    EXCISION VAGINAL CYST     MASTECTOMY Right 10/08/06   prophylactic   RADICAL MASTECTOMY LND  1990   left, chemo done   ROBOTIC ASSISTED TOTAL HYSTERECTOMY WITH BILATERAL SALPINGO OOPHERECTOMY Bilateral 03/21/2014   Procedure: ROBOTIC ASSISTED TOTAL HYSTERECTOMY WITH BILATERAL SALPINGO OOPHORECTOMY ;   Surgeon: Laurette Schimke, MD;  Location: [facility];  Service: Gynecology;  Laterality: Bilateral;   Patient Active Problem List   Diagnosis Date Noted   Age-related osteoporosis without current pathological fracture 03/19/2020   Personal history of colonic polyps 03/19/2020   Transient cerebral ischemia 09/15/2017   Pure hypercholesterolemia 09/15/2017   Pulmonary nodule 09/15/2017   Hypertension 09/15/2017   Gastro-esophageal reflux disease without esophagitis 09/15/2017   VAIN I (vaginal intraepithelial neoplasia grade I) 06/23/2017   Genetic testing 05/23/2014   History of breast cancer    Family history of breast cancer    Endometrial cancer (HCC) 03/21/2014   Uterine cancer (HCC) 03/21/2014   S/P mastectomy, bilateral 05/02/2013   Situational stress 05/02/2013   Osteoporosis, unspecified 05/02/2013   Breast cancer, left breast (HCC) 05/02/2013    PCP: Merlene Laughter, MD  REFERRING PROVIDER: Wyvonnia Lora MD  REFERRING DIAG: LE Lymphedema  THERAPY DIAG:  Lymphedema, not elsewhere classified  History of uterine cancer  ONSET DATE: around 05/2021  Rationale for Evaluation and Treatment: Rehabilitation  SUBJECTIVE:  SUBJECTIVE STATEMENT:   10/07/2022 The weak feeling is still there and when it happens it really gets me. My FP MD did a muscle enzyme test, and 2 other labs and everything was normal. The weakness happens multiple times a day, but it doesn't make me feel like I will pass out.  I have tried the MLD about 3 times. I feel clumsy when I do it.   09/22/2022 EVAL Pt returns due to swelling in her legs, left greater than right. It comes and goes.. This morning it  seems to be down. The socks we ordered online were really good, but hot,but I ordered some online at [facility] that were thinner. When  I was in [place] we did a great deal of walking. I have done my MLD occasionally and do both legs but I feel I need a refresher.. She had an injection in her right knee last Tuesday with Kenalog and she had some strange side effects; it made me feel weak in my arms and legs and caused some tingling in my arms and legs. The sensation is still there but not as severe. I have been inactive today so my legs look pretty good. Since the injection last week and feeling funny I have not done much. (Pt advised to follow up with MD about symptoms she is still having) She will be starting PT at [facility] for Right knee pain.  PERTINENT HISTORY:  History notable for left breast cancer ER-PR-  treated with radical mastectomy and chemotherapy in 1990.  Subsequently underwent right simple mastectomy 2008 without reconstruction. Denies use of tamoxifen  On 03/21/14  she underwent a robotic hysterectomy for treatment of uterine cancer, BSO. Frozen section revealed grade 1 cancer. Final pathology revealed a grade 2 lesion with LVSI present and she was staged at stage IA. She was recommended vaginal brachytherapy as adjuvant therapy to reduce risk for local recurrence. Dr Karoline Caldwell delivered vaginal brachytherapy completed on 05/29/14. Her treatments were without complication. Pt had therapy from 11/12/21 to 01/08/22 for bilateral LE swelling. PAIN:  Are you having pain? No  PRECAUTIONS: Bilateral LE lymphedema,s/p Left breast cancer  WEIGHT BEARING RESTRICTIONS: No  FALLS:  Has patient fallen in last 6 months? No  LIVING ENVIRONMENT: Lives with: lives alone Lives in: House/apartment Stairs: No;  Has following equipment at home: None  OCCUPATION: Retired  LEISURE: walks, Pilates with Roanna Raider  PRIOR LEVEL OF FUNCTION: Independent  PATIENT GOALS: Review self MLD,check present compression garments and purchase new prn,    OBJECTIVE:  COGNITION: Overall cognitive status: Within functional limits for  tasks assessed   PALPATION: Tenderness left lower leg with light palpation, not so on right. Very mild pitting bilaterally mostly foot/ankle  OBSERVATIONS / OTHER ASSESSMENTS: multiple varicosities noted bilateral LE's  SENSATION: Light touch: Appears intact  POSTURE: Forward head rounded shoulders  LYMPHEDEMA ASSESSMENTS:   SURGERY TYPE/DATE: uterine cancer with total hysterectomy in 2016, 1990 L mastectomy and ALND, 2008 R mastectomy   NUMBER OF LYMPH NODES REMOVED: 0  CHEMOTHERAPY: completed for L breast cancer   RADIATION:completed  HORMONE TREATMENT: NONE  INFECTIONS: NONE  LYMPHEDEMA ASSESSMENTS:   LOWER EXTREMITY LANDMARK RIGHT eval  At groin   30 cm proximal to suprapatella   20 cm proximal to suprapatella   10 cm proximal to suprapatella   At midpatella / popliteal crease   30 cm proximal to floor at lateral plantar foot 36  20 cm proximal to floor at lateral plantar foot 28.2  10 cm proximal to floor at lateral plantar foot 22.0  Circumference of ankle/heel   5 cm proximal to 1st MTP joint 20.3  Across MTP joint 20.5  Around proximal great toe 7  (Blank rows = not tested)  LOWER EXTREMITY LANDMARK LEFT eval  At groin   30 cm proximal to suprapatella   20 cm proximal to suprapatella   10 cm proximal to suprapatella   At midpatella / popliteal crease   30 cm proximal to floor at lateral plantar foot 36.6  20 cm proximal to floor at lateral plantar foot 29.1  10 cm proximal to floor at lateral plantar foot 23.2  Circumference of ankle/heel   5 cm proximal to 1st MTP joint 20.7  Across MTP joint 20.5  Around proximal great toe 7.3  (Blank rows = not tested)  GAIT: Distance walked: 40 ft Assistive device utilized: None Level of assistance: Complete Independence Comments: normal gait, no difficulties  Outcome measure:  TODAY'S TREATMENT:                                                                                                                                           DATE:  10/07/2022  MLD: In Supine with HOB elevated and legs elevated on large bolster and smaller round bolster: Short neck, 5 diaphragmatic breaths, Lt inguinal nodes, (no anastomosis due to Lt ALND in 1990); and then Lt LE working from proximal to distal, then retracing all steps (with intact sequence) and ending with LN's . Therpist performed steps and then had pt repeat all. She did very well with occasional VC and TC's MLD to the right LE using the right axillary Lns. Therapist then activated right LN's a axillary LN's,right  inguinal-axillary pathway and Right LE lateral thigh, then medial to lateral retracing steps, knee and lower leg retracing steps then right foot retracing steps and ending with LN"S. Therapist performed all on this side with verbalization to pt.    09/30/2022  Pt brought her Juzo 15-20 compression socks with her but they are too warm for this time of year per pt. Removed her 8-15 MM Hg stockings and her legs look good MLD: In Supine with HOB elevated and legs elevated on large bolster and smaller round bolster: Short neck, 5 diaphragmatic breaths, Lt inguinal nodes, (no anastomosis due to Lt ALND in 1990); and then Lt LE working from proximal to distal, then retracing all steps (with intact sequence) and ending with LN's  MLD to the right LE using the right axillary Lns. Therapist then activated right LN's and had pt demonstrate each of the steps including axillary LN's,right  inguinal-axillary pathway and Right LE lateral thigh, then medial to lateral retracing steps, knee and lower leg retracing steps then right foot retracing steps and ending with LN"S.   09/24/22  Pt removed compression stockings; wearing [employer] knee high compression 8-15 mmhg, and  legs look OK today. Pt was able to don and doff independently MLD: In Supine with HOB elevated and legs elevated on large bolster and smaller round bolster: Short neck, 5 diaphragmatic breaths, Lt  inguinal nodes, (no anastomosis due to Lt ALND in 1990); and then Lt LE working from proximal to distal, then retracing all steps (with intact sequence) and ending with LN's  MLD to the right LE using the right axillary Lns. Therapist then activated right LN's and had pt demonstrate each of the steps including axillary LN's,right  inguinal-axillary pathway and Right LE lateral thigh, then medial to lateral retracing steps, knee and lower leg retracing steps then right foot retracing steps and ending with LN"S.Pt practiced each step on the right and needs moderate VC's and visual cues.  6/24/24MLD: In Supine with HOB elevated and legs elevated on large bolster with pillows under knees: Short neck, 5 diaphragmatic breaths, Lt inguinal nodes, (no anastomosis due to Lt ALND in 1990); and then Lt LE working from proximal to distal, then retracing all steps (with intact sequence) but will continue intact for remaining sessions for Lt LE) /24/2024 Initiated MLD to left lower extremity; short neck, 5 breaths, left inguinal LN's, Lateral knee to thigh, then inner thigh to outer, repeating lateral thigh, then ankle to knee repeating both sides of leg , retracing steps  lateral thigh to hip, then left foot retracing all steps and ending with LN's at left groin.  PATIENT EDUCATION:  Education details: POC, bring garments next discussed legs will swell if not wearing compression Person educated: Patient Education method: Explanation and Demonstration Education comprehension: verbalized understanding, requires further education  HOME EXERCISE PROGRAM: None given today  ASSESSMENT:  CLINICAL IMPRESSION:  Pts legs are looking god and maintaining well with her thinner compression stockings. She was able to return demonstrate MLD on the left leg with intermittent VC's and TC's. Her legs felt "stimulated and good" after MLD. OBJECTIVE IMPAIRMENTS: decreased knowledge of condition, decreased knowledge of use of DME,  and increased edema.   ACTIVITY LIMITATIONS:  no limitations but legs swell more with being on feet for prolong periods or sitting prolonged periods  PARTICIPATION LIMITATIONS:  none  PERSONAL FACTORS:  none  are also affecting patient's functional outcome.   REHAB POTENTIAL: Excellent  CLINICAL DECISION MAKING: Stable/uncomplicated  EVALUATION COMPLEXITY: Low   GOALS: Goals reviewed with patient? Yes  SHORT TERM GOALS=LONG TERM GOALS: Target date: 10/20/2022     Pt will be independent in self MLD for long term management of lymphedema.  Baseline:  Goal status: INITIAL  2. Pt will obtain have/obtain appropriate compression garments for long term management of lymphedema.  Baseline: Goal status: INITIAL   3.  Pt will be more compliant with compression garments to maintain edema reduction Baseline:  Goal status: INITIAL    PLAN:  PT FREQUENCY: 2x/week PT DURATION: 4 weeks   PLANNED INTERVENTIONS: Therapeutic exercises, Patient/Family education, Self Care, Orthotic/Fit training, DME instructions, Manual lymph drainage, Compression bandaging, Vasopneumatic device, and Manual therapy   PLAN FOR NEXT SESSION: Is pt still having symptoms she relates to Kenalog injection? Did she see MD? begin MLD to bilateral LE and instruct/review self MLD technique, look at old compression garments and determine if she needs new ones. Encourage compliance with compression.   Waynette Buttery, PT 10/07/2022, 2:55 PM

## 2022-10-08 DIAGNOSIS — M1711 Unilateral primary osteoarthritis, right knee: Secondary | ICD-10-CM | POA: Diagnosis not present

## 2022-10-09 ENCOUNTER — Ambulatory Visit: Payer: Medicare HMO

## 2022-10-09 DIAGNOSIS — I890 Lymphedema, not elsewhere classified: Secondary | ICD-10-CM

## 2022-10-09 DIAGNOSIS — Z8542 Personal history of malignant neoplasm of other parts of uterus: Secondary | ICD-10-CM | POA: Diagnosis not present

## 2022-10-09 NOTE — Therapy (Signed)
OUTPATIENT PHYSICAL THERAPY  LOWER EXTREMITY ONCOLOGY TREATMENT  Patient Name: Beth Simon MRN: [id number] DOB:Mar 07, 1941, 82 y.o., female Today's Date: 10/09/2022  END OF SESSION:  PT End of Session - 10/09/22 1059     Visit Number 5    Number of Visits 12    Date for PT Re-Evaluation 11/03/22    PT Start Time 1100    PT Stop Time 1155    PT Time Calculation (min) 55 min    Activity Tolerance Patient tolerated treatment well    Behavior During Therapy [facility] for tasks assessed/performed             Past Medical History:  Diagnosis Date   Adenomatous polyps 01/2016   Anemia    Breast cancer (HCC) 1990   bilateral mastectomy, adenoca breast-left MRM, reconstruction, chemo   Bronchitis last 2 weeks   saw dr Pete Glatter 02-28-2014, he said no antibiotics needed, nonproductive cough   Complication of anesthesia    Cystitis    cytoxen, had once or twice   Family history of breast cancer    History of breast cancer    History of radiation therapy 2/10, 2/12, 2/18, 2/24, 05/29/14   vaginal cuff/ 30 Gy/ 5 fx   Hypertension    Macular degeneration    Neck pain    taking physictal therapy last 2 weeks   Osteoporosis    On Prolia.   PONV (postoperative nausea and vomiting)    Shoulder pain    taking physical therapy for last few weeks   Uterine cancer (HCC) 03/21/2014   MLH1/PMS2 LOH   Uterine fibroid    VAIN I (vaginal intraepithelial neoplasia grade I) 2018   positive HR HPV.   Vasovagal syncopes    Past Surgical History:  Procedure Laterality Date   BREAST IMPLANT REMOVAL  6/94   left breast   BREAST RECONSTRUCTION  1990   left   CATARACT EXTRACTION Bilateral    EXCISION VAGINAL CYST     MASTECTOMY Right 10/08/06   prophylactic   RADICAL MASTECTOMY LND  1990   left, chemo done   ROBOTIC ASSISTED TOTAL HYSTERECTOMY WITH BILATERAL SALPINGO OOPHERECTOMY Bilateral 03/21/2014   Procedure: ROBOTIC ASSISTED TOTAL HYSTERECTOMY WITH BILATERAL SALPINGO OOPHORECTOMY ;   Surgeon: Laurette Schimke, MD;  Location: [facility];  Service: Gynecology;  Laterality: Bilateral;   Patient Active Problem List   Diagnosis Date Noted   Age-related osteoporosis without current pathological fracture 03/19/2020   Personal history of colonic polyps 03/19/2020   Transient cerebral ischemia 09/15/2017   Pure hypercholesterolemia 09/15/2017   Pulmonary nodule 09/15/2017   Hypertension 09/15/2017   Gastro-esophageal reflux disease without esophagitis 09/15/2017   VAIN I (vaginal intraepithelial neoplasia grade I) 06/23/2017   Genetic testing 05/23/2014   History of breast cancer    Family history of breast cancer    Endometrial cancer (HCC) 03/21/2014   Uterine cancer (HCC) 03/21/2014   S/P mastectomy, bilateral 05/02/2013   Situational stress 05/02/2013   Osteoporosis, unspecified 05/02/2013   Breast cancer, left breast (HCC) 05/02/2013    PCP: Merlene Laughter, MD  REFERRING PROVIDER: Wyvonnia Lora MD  REFERRING DIAG: LE Lymphedema  THERAPY DIAG:  Lymphedema, not elsewhere classified  History of uterine cancer  ONSET DATE: around 05/2021  Rationale for Evaluation and Treatment: Rehabilitation  SUBJECTIVE:  SUBJECTIVE STATEMENT:  10/09/2022 My legs feel good today. They have felt better since last visit.  09/22/2022 EVAL Pt returns due to swelling in her legs, left greater than right. It comes and goes.. This morning it  seems to be down. The socks we ordered online were really good, but hot,but I ordered some online at [facility] that were thinner. When I was in [place] we did a great deal of walking. I have done my MLD occasionally and do both legs but I feel I need a refresher.. She had an injection in her right knee last Tuesday with Kenalog and she had some strange side effects; it made me  feel weak in my arms and legs and caused some tingling in my arms and legs. The sensation is still there but not as severe. I have been inactive today so my legs look pretty good. Since the injection last week and feeling funny I have not done much. (Pt advised to follow up with MD about symptoms she is still having) She will be starting PT at [facility] for Right knee pain.  PERTINENT HISTORY:  History notable for left breast cancer ER-PR-  treated with radical mastectomy and chemotherapy in 1990.  Subsequently underwent right simple mastectomy 2008 without reconstruction. Denies use of tamoxifen  On 03/21/14  she underwent a robotic hysterectomy for treatment of uterine cancer, BSO. Frozen section revealed grade 1 cancer. Final pathology revealed a grade 2 lesion with LVSI present and she was staged at stage IA. She was recommended vaginal brachytherapy as adjuvant therapy to reduce risk for local recurrence. Dr Karoline Caldwell delivered vaginal brachytherapy completed on 05/29/14. Her treatments were without complication. Pt had therapy from 11/12/21 to 01/08/22 for bilateral LE swelling. PAIN:  Are you having pain? No  PRECAUTIONS: Bilateral LE lymphedema,s/p Left breast cancer  WEIGHT BEARING RESTRICTIONS: No  FALLS:  Has patient fallen in last 6 months? No  LIVING ENVIRONMENT: Lives with: lives alone Lives in: House/apartment Stairs: No;  Has following equipment at home: None  OCCUPATION: Retired  LEISURE: walks, Pilates with Roanna Raider  PRIOR LEVEL OF FUNCTION: Independent  PATIENT GOALS: Review self MLD,check present compression garments and purchase new prn,    OBJECTIVE:  COGNITION: Overall cognitive status: Within functional limits for tasks assessed   PALPATION: Tenderness left lower leg with light palpation, not so on right. Very mild pitting bilaterally mostly foot/ankle  OBSERVATIONS / OTHER ASSESSMENTS: multiple varicosities noted bilateral LE's  SENSATION: Light  touch: Appears intact  POSTURE: Forward head rounded shoulders  LYMPHEDEMA ASSESSMENTS:   SURGERY TYPE/DATE: uterine cancer with total hysterectomy in 2016, 1990 L mastectomy and ALND, 2008 R mastectomy   NUMBER OF LYMPH NODES REMOVED: 0  CHEMOTHERAPY: completed for L breast cancer   RADIATION:completed  HORMONE TREATMENT: NONE  INFECTIONS: NONE  LYMPHEDEMA ASSESSMENTS:   LOWER EXTREMITY LANDMARK RIGHT eval RIGHT 10/09/2022  At groin    30 cm proximal to suprapatella    20 cm proximal to suprapatella    10 cm proximal to suprapatella    At midpatella / popliteal crease    30 cm proximal to floor at lateral plantar foot 36 36  20 cm proximal to floor at lateral plantar foot 28.2 28.3  10 cm proximal to floor at lateral plantar foot 22.0 22.1  Circumference of ankle/heel    5 cm proximal to 1st MTP joint 20.3   Across MTP joint 20.5 20.3  Around proximal great toe 7 7  (Blank rows = not  tested)  LOWER EXTREMITY LANDMARK LEFT eval LEFT 10/09/2022  At groin    30 cm proximal to suprapatella    20 cm proximal to suprapatella    10 cm proximal to suprapatella    At midpatella / popliteal crease    30 cm proximal to floor at lateral plantar foot 36.6 36.7  20 cm proximal to floor at lateral plantar foot 29.1 29.3  10 cm proximal to floor at lateral plantar foot 23.2 22.5  Circumference of ankle/heel    5 cm proximal to 1st MTP joint 20.7   Across MTP joint 20.5 20.3  Around proximal great toe 7.3 7.1  (Blank rows = not tested)  GAIT: Distance walked: 40 ft Assistive device utilized: None Level of assistance: Complete Independence Comments: normal gait, no difficulties  Outcome measure:  TODAY'S TREATMENT:                                                                                                                                          DATE: 10/09/2022 Measured pts legs bilaterally and they remain stable with her 8-15 mmHG compression stockings MLD: In  Supine with HOB elevated and legs elevated on large bolster and smaller round bolster: Short neck, 5 diaphragmatic breaths, Lt inguinal nodes, (no anastomosis due to Lt ALND in 1990); and then Lt LE working from proximal to distal, then retracing all steps (with intact sequence) and ending with LN's . Therpist performed steps and then had pt repeat all. She did very well with occasional VC and TC's MLD to the right LE using the right axillary Lns. Therapist then activated right LN's a axillary LN's,right  inguinal-axillary pathway and Right LE lateral thigh, then medial to lateral retracing steps, knee and lower leg retracing steps then right foot retracing steps and ending with LN"S. Therapist performed all on this side with verbalization to pt.  10/07/2022  MLD: In Supine with HOB elevated and legs elevated on large bolster and smaller round bolster: Short neck, 5 diaphragmatic breaths, Lt inguinal nodes, (no anastomosis due to Lt ALND in 1990); and then Lt LE working from proximal to distal, then retracing all steps (with intact sequence) and ending with LN's . Therpist performed steps and then had pt repeat all. She did very well with occasional VC and TC's MLD to the right LE using the right axillary Lns. Therapist then activated right LN's a axillary LN's,right  inguinal-axillary pathway and Right LE lateral thigh, then medial to lateral retracing steps, knee and lower leg retracing steps then right foot retracing steps and ending with LN"S. Therapist performed all on this side with verbalization to pt.    09/30/2022  Pt brought her Juzo 15-20 compression socks with her but they are too warm for this time of year per pt. Removed her 8-15 MM Hg stockings and her legs look good MLD: In Supine with HOB elevated and  legs elevated on large bolster and smaller round bolster: Short neck, 5 diaphragmatic breaths, Lt inguinal nodes, (no anastomosis due to Lt ALND in 1990); and then Lt LE working from proximal  to distal, then retracing all steps (with intact sequence) and ending with LN's  MLD to the right LE using the right axillary Lns. Therapist then activated right LN's and had pt demonstrate each of the steps including axillary LN's,right  inguinal-axillary pathway and Right LE lateral thigh, then medial to lateral retracing steps, knee and lower leg retracing steps then right foot retracing steps and ending with LN"S.   09/24/22  Pt removed compression stockings; wearing [employer] knee high compression 8-15 mmhg, and legs look OK today. Pt was able to don and doff independently MLD: In Supine with HOB elevated and legs elevated on large bolster and smaller round bolster: Short neck, 5 diaphragmatic breaths, Lt inguinal nodes, (no anastomosis due to Lt ALND in 1990); and then Lt LE working from proximal to distal, then retracing all steps (with intact sequence) and ending with LN's  MLD to the right LE using the right axillary Lns. Therapist then activated right LN's and had pt demonstrate each of the steps including axillary LN's,right  inguinal-axillary pathway and Right LE lateral thigh, then medial to lateral retracing steps, knee and lower leg retracing steps then right foot retracing steps and ending with LN"S.Pt practiced each step on the right and needs moderate VC's and visual cues.  6/24/24MLD: In Supine with HOB elevated and legs elevated on large bolster with pillows under knees: Short neck, 5 diaphragmatic breaths, Lt inguinal nodes, (no anastomosis due to Lt ALND in 1990); and then Lt LE working from proximal to distal, then retracing all steps (with intact sequence) but will continue intact for remaining sessions for Lt LE) /24/2024 Initiated MLD to left lower extremity; short neck, 5 breaths, left inguinal LN's, Lateral knee to thigh, then inner thigh to outer, repeating lateral thigh, then ankle to knee repeating both sides of leg , retracing steps  lateral thigh to hip, then left foot retracing  all steps and ending with LN's at left groin.  PATIENT EDUCATION:  Education details: POC, bring garments next discussed legs will swell if not wearing compression Person educated: Patient Education method: Explanation and Demonstration Education comprehension: verbalized understanding, requires further education  HOME EXERCISE PROGRAM: None given today  ASSESSMENT:  CLINICAL IMPRESSION:  Pts legs have been feeling good since last visit with no real complaints of fatigue. She always feels better after MLD. She did a good job return demonstrating techniques today with occasional VC's to maintain stretch slightly longer and for direction. OBJECTIVE IMPAIRMENTS: decreased knowledge of condition, decreased knowledge of use of DME, and increased edema.   ACTIVITY LIMITATIONS:  no limitations but legs swell more with being on feet for prolong periods or sitting prolonged periods  PARTICIPATION LIMITATIONS:  none  PERSONAL FACTORS:  none  are also affecting patient's functional outcome.   REHAB POTENTIAL: Excellent  CLINICAL DECISION MAKING: Stable/uncomplicated  EVALUATION COMPLEXITY: Low   GOALS: Goals reviewed with patient? Yes  SHORT TERM GOALS=LONG TERM GOALS: Target date: 10/20/2022     Pt will be independent in self MLD for long term management of lymphedema.  Baseline:  Goal status: INITIAL  2. Pt will obtain have/obtain appropriate compression garments for long term management of lymphedema.  Baseline: Goal status: INITIAL   3.  Pt will be more compliant with compression garments to maintain edema reduction Baseline:  Goal status: INITIAL    PLAN:  PT FREQUENCY: 2x/week PT DURATION: 4 weeks   PLANNED INTERVENTIONS: Therapeutic exercises, Patient/Family education, Self Care, Orthotic/Fit training, DME instructions, Manual lymph drainage, Compression bandaging, Vasopneumatic device, and Manual therapy   PLAN FOR NEXT SESSION: Is pt still having symptoms she  relates to Kenalog injection? Did she see MD? begin MLD to bilateral LE and instruct/review self MLD technique, look at old compression garments and determine if she needs new ones. Encourage compliance with compression.   Waynette Buttery, PT 10/09/2022, 11:57 AM

## 2022-10-10 ENCOUNTER — Ambulatory Visit: Payer: Medicare HMO | Admitting: Podiatry

## 2022-10-10 DIAGNOSIS — M79676 Pain in unspecified toe(s): Secondary | ICD-10-CM

## 2022-10-10 DIAGNOSIS — B351 Tinea unguium: Secondary | ICD-10-CM

## 2022-10-10 DIAGNOSIS — Q828 Other specified congenital malformations of skin: Secondary | ICD-10-CM

## 2022-10-13 ENCOUNTER — Ambulatory Visit: Payer: Medicare HMO

## 2022-10-13 DIAGNOSIS — I890 Lymphedema, not elsewhere classified: Secondary | ICD-10-CM | POA: Diagnosis not present

## 2022-10-13 DIAGNOSIS — R69 Illness, unspecified: Secondary | ICD-10-CM | POA: Diagnosis not present

## 2022-10-13 DIAGNOSIS — Z8542 Personal history of malignant neoplasm of other parts of uterus: Secondary | ICD-10-CM | POA: Diagnosis not present

## 2022-10-13 NOTE — Patient Instructions (Signed)
 CR

## 2022-10-13 NOTE — Therapy (Signed)
OUTPATIENT PHYSICAL THERAPY  LOWER EXTREMITY ONCOLOGY TREATMENT  Patient Name: Beth Simon MRN: [id number] DOB:12/15/1940, 82 y.o., female Today's Date: 10/13/2022  END OF SESSION:  PT End of Session - 10/13/22 1503     Visit Number 6    Number of Visits 12    Date for PT Re-Evaluation 11/03/22    PT Start Time 1504    PT Stop Time 1555    PT Time Calculation (min) 51 min    Activity Tolerance Patient tolerated treatment well    Behavior During Therapy [facility] for tasks assessed/performed             Past Medical History:  Diagnosis Date   Adenomatous polyps 01/2016   Anemia    Breast cancer (HCC) 1990   bilateral mastectomy, adenoca breast-left MRM, reconstruction, chemo   Bronchitis last 2 weeks   saw dr Pete Glatter 02-28-2014, he said no antibiotics needed, nonproductive cough   Complication of anesthesia    Cystitis    cytoxen, had once or twice   Family history of breast cancer    History of breast cancer    History of radiation therapy 2/10, 2/12, 2/18, 2/24, 05/29/14   vaginal cuff/ 30 Gy/ 5 fx   Hypertension    Macular degeneration    Neck pain    taking physictal therapy last 2 weeks   Osteoporosis    On Prolia.   PONV (postoperative nausea and vomiting)    Shoulder pain    taking physical therapy for last few weeks   Uterine cancer (HCC) 03/21/2014   MLH1/PMS2 LOH   Uterine fibroid    VAIN I (vaginal intraepithelial neoplasia grade I) 2018   positive HR HPV.   Vasovagal syncopes    Past Surgical History:  Procedure Laterality Date   BREAST IMPLANT REMOVAL  6/94   left breast   BREAST RECONSTRUCTION  1990   left   CATARACT EXTRACTION Bilateral    EXCISION VAGINAL CYST     MASTECTOMY Right 10/08/06   prophylactic   RADICAL MASTECTOMY LND  1990   left, chemo done   ROBOTIC ASSISTED TOTAL HYSTERECTOMY WITH BILATERAL SALPINGO OOPHERECTOMY Bilateral 03/21/2014   Procedure: ROBOTIC ASSISTED TOTAL HYSTERECTOMY WITH BILATERAL SALPINGO OOPHORECTOMY ;   Surgeon: Laurette Schimke, MD;  Location: [facility];  Service: Gynecology;  Laterality: Bilateral;   Patient Active Problem List   Diagnosis Date Noted   Age-related osteoporosis without current pathological fracture 03/19/2020   Personal history of colonic polyps 03/19/2020   Transient cerebral ischemia 09/15/2017   Pure hypercholesterolemia 09/15/2017   Pulmonary nodule 09/15/2017   Hypertension 09/15/2017   Gastro-esophageal reflux disease without esophagitis 09/15/2017   VAIN I (vaginal intraepithelial neoplasia grade I) 06/23/2017   Genetic testing 05/23/2014   History of breast cancer    Family history of breast cancer    Endometrial cancer (HCC) 03/21/2014   Uterine cancer (HCC) 03/21/2014   S/P mastectomy, bilateral 05/02/2013   Situational stress 05/02/2013   Osteoporosis, unspecified 05/02/2013   Breast cancer, left breast (HCC) 05/02/2013    PCP: Merlene Laughter, MD  REFERRING PROVIDER: Wyvonnia Lora MD  REFERRING DIAG: LE Lymphedema  THERAPY DIAG:  Lymphedema, not elsewhere classified  History of uterine cancer  ONSET DATE: around 05/2021  Rationale for Evaluation and Treatment: Rehabilitation  SUBJECTIVE:  SUBJECTIVE STATEMENT:  10/13/2022 My legs were down well this am, but I didn't wear my stockings for the exercise class so the left leg is a little swollen. I am doing the MLD and I think doing well. Yesterday just sitting in church I got the weak jelliness feeling again in my legs that I have had since the cortisone injection  09/22/2022 EVAL Pt returns due to swelling in her legs, left greater than right. It comes and goes.. This morning it  seems to be down. The socks we ordered online were really good, but hot,but I ordered some online at [facility] that were thinner. When I was in [place]  we did a great deal of walking. I have done my MLD occasionally and do both legs but I feel I need a refresher.. She had an injection in her right knee last Tuesday with Kenalog and she had some strange side effects; it made me feel weak in my arms and legs and caused some tingling in my arms and legs. The sensation is still there but not as severe. I have been inactive today so my legs look pretty good. Since the injection last week and feeling funny I have not done much. (Pt advised to follow up with MD about symptoms she is still having) She will be starting PT at [facility] for Right knee pain.  PERTINENT HISTORY:  History notable for left breast cancer ER-PR-  treated with radical mastectomy and chemotherapy in 1990.  Subsequently underwent right simple mastectomy 2008 without reconstruction. Denies use of tamoxifen  On 03/21/14  she underwent a robotic hysterectomy for treatment of uterine cancer, BSO. Frozen section revealed grade 1 cancer. Final pathology revealed a grade 2 lesion with LVSI present and she was staged at stage IA. She was recommended vaginal brachytherapy as adjuvant therapy to reduce risk for local recurrence. Dr Karoline Caldwell delivered vaginal brachytherapy completed on 05/29/14. Her treatments were without complication. Pt had therapy from 11/12/21 to 01/08/22 for bilateral LE swelling. PAIN:  Are you having pain? No  PRECAUTIONS: Bilateral LE lymphedema,s/p Left breast cancer  WEIGHT BEARING RESTRICTIONS: No  FALLS:  Has patient fallen in last 6 months? No  LIVING ENVIRONMENT: Lives with: lives alone Lives in: House/apartment Stairs: No;  Has following equipment at home: None  OCCUPATION: Retired  LEISURE: walks, Pilates with Roanna Raider  PRIOR LEVEL OF FUNCTION: Independent  PATIENT GOALS: Review self MLD,check present compression garments and purchase new prn,    OBJECTIVE:  COGNITION: Overall cognitive status: Within functional limits for tasks  assessed   PALPATION: Tenderness left lower leg with light palpation, not so on right. Very mild pitting bilaterally mostly foot/ankle  OBSERVATIONS / OTHER ASSESSMENTS: multiple varicosities noted bilateral LE's  SENSATION: Light touch: Appears intact  POSTURE: Forward head rounded shoulders  LYMPHEDEMA ASSESSMENTS:   SURGERY TYPE/DATE: uterine cancer with total hysterectomy in 2016, 1990 L mastectomy and ALND, 2008 R mastectomy   NUMBER OF LYMPH NODES REMOVED: 0  CHEMOTHERAPY: completed for L breast cancer   RADIATION:completed  HORMONE TREATMENT: NONE  INFECTIONS: NONE  LYMPHEDEMA ASSESSMENTS:   LOWER EXTREMITY LANDMARK RIGHT eval RIGHT 10/09/2022  At groin    30 cm proximal to suprapatella    20 cm proximal to suprapatella    10 cm proximal to suprapatella    At midpatella / popliteal crease    30 cm proximal to floor at lateral plantar foot 36 36  20 cm proximal to floor at lateral plantar foot 28.2 28.3  10 cm proximal to floor at lateral plantar foot 22.0 22.1  Circumference of ankle/heel    5 cm proximal to 1st MTP joint 20.3   Across MTP joint 20.5 20.3  Around proximal great toe 7 7  (Blank rows = not tested)  LOWER EXTREMITY LANDMARK LEFT eval LEFT 10/09/2022  At groin    30 cm proximal to suprapatella    20 cm proximal to suprapatella    10 cm proximal to suprapatella    At midpatella / popliteal crease    30 cm proximal to floor at lateral plantar foot 36.6 36.7  20 cm proximal to floor at lateral plantar foot 29.1 29.3  10 cm proximal to floor at lateral plantar foot 23.2 22.5  Circumference of ankle/heel    5 cm proximal to 1st MTP joint 20.7   Across MTP joint 20.5 20.3  Around proximal great toe 7.3 7.1  (Blank rows = not tested)  GAIT: Distance walked: 40 ft Assistive device utilized: None Level of assistance: Complete Independence Comments: normal gait, no difficulties  Outcome measure:  TODAY'S TREATMENT:                                                                                                                                           DATE:  10/13/2022     10/09/2022 Measured pts legs bilaterally and they remain stable with her 8-15 mmHG compression stockings MLD: In Supine with HOB elevated and legs elevated on large bolster and smaller round bolster: Short neck, 5 diaphragmatic breaths, Lt inguinal nodes, (no anastomosis due to Lt ALND in 1990); and then Lt LE working from proximal to distal, then retracing all steps (with intact sequence) and ending with LN's . Therpist performed steps and then had pt repeat steps verbally and with use of hands.. She did very well with occasional VC and TC's MLD to the right LE using the right axillary Lns. Therapist then activated right LN's a axillary LN's,right  inguinal-axillary pathway and Right LE lateral thigh, then medial to lateral retracing steps, knee and lower leg retracing steps then right foot retracing steps and ending with LN"S. Therapist performed all on this side with verbalization to pt. Pt given written handout detailing proper sequences 10/07/2022  MLD: In Supine with HOB elevated and legs elevated on large bolster and smaller round bolster: Short neck, 5 diaphragmatic breaths, Lt inguinal nodes, (no anastomosis due to Lt ALND in 1990); and then Lt LE working from proximal to distal, then retracing all steps (with intact sequence) and ending with LN's . Therpist performed steps and then had pt repeat all. She did very well with occasional VC and TC's MLD to the right LE using the right axillary Lns. Therapist then activated right LN's a axillary LN's,right  inguinal-axillary pathway and Right LE lateral thigh, then medial to lateral retracing steps, knee and lower leg retracing steps  then right foot retracing steps and ending with LN"S. Therapist performed all on this side with verbalization to pt.    09/30/2022  Pt brought her Juzo 15-20 compression socks with  her but they are too warm for this time of year per pt. Removed her 8-15 MM Hg stockings and her legs look good MLD: In Supine with HOB elevated and legs elevated on large bolster and smaller round bolster: Short neck, 5 diaphragmatic breaths, Lt inguinal nodes, (no anastomosis due to Lt ALND in 1990); and then Lt LE working from proximal to distal, then retracing all steps (with intact sequence) and ending with LN's  MLD to the right LE using the right axillary Lns. Therapist then activated right LN's and had pt demonstrate each of the steps including axillary LN's,right  inguinal-axillary pathway and Right LE lateral thigh, then medial to lateral retracing steps, knee and lower leg retracing steps then right foot retracing steps and ending with LN"S.   09/24/22  Pt removed compression stockings; wearing [employer] knee high compression 8-15 mmhg, and legs look OK today. Pt was able to don and doff independently MLD: In Supine with HOB elevated and legs elevated on large bolster and smaller round bolster: Short neck, 5 diaphragmatic breaths, Lt inguinal nodes, (no anastomosis due to Lt ALND in 1990); and then Lt LE working from proximal to distal, then retracing all steps (with intact sequence) and ending with LN's  MLD to the right LE using the right axillary Lns. Therapist then activated right LN's and had pt demonstrate each of the steps including axillary LN's,right  inguinal-axillary pathway and Right LE lateral thigh, then medial to lateral retracing steps, knee and lower leg retracing steps then right foot retracing steps and ending with LN"S.Pt practiced each step on the right and needs moderate VC's and visual cues.  6/24/24MLD: In Supine with HOB elevated and legs elevated on large bolster with pillows under knees: Short neck, 5 diaphragmatic breaths, Lt inguinal nodes, (no anastomosis due to Lt ALND in 1990); and then Lt LE working from proximal to distal, then retracing all steps (with intact  sequence) but will continue intact for remaining sessions for Lt LE) /24/2024 Initiated MLD to left lower extremity; short neck, 5 breaths, left inguinal LN's, Lateral knee to thigh, then inner thigh to outer, repeating lateral thigh, then ankle to knee repeating both sides of leg , retracing steps  lateral thigh to hip, then left foot retracing all steps and ending with LN's at left groin.  PATIENT EDUCATION:  Education details: POC, bring garments next discussed legs will swell if not wearing compression Person educated: Patient Education method: Explanation and Demonstration Education comprehension: verbalized understanding, requires further education  HOME EXERCISE PROGRAM: None given today  ASSESSMENT:  CLINICAL IMPRESSION: Pt seems to have a good understanding of the sequence and has improved also with her technique. Occasional VC's still required for technique, but doing very well overall.  OBJECTIVE IMPAIRMENTS: decreased knowledge of condition, decreased knowledge of use of DME, and increased edema.   ACTIVITY LIMITATIONS:  no limitations but legs swell more with being on feet for prolong periods or sitting prolonged periods  PARTICIPATION LIMITATIONS:  none  PERSONAL FACTORS:  none  are also affecting patient's functional outcome.   REHAB POTENTIAL: Excellent  CLINICAL DECISION MAKING: Stable/uncomplicated  EVALUATION COMPLEXITY: Low   GOALS: Goals reviewed with patient? Yes  SHORT TERM GOALS=LONG TERM GOALS: Target date: 10/20/2022     Pt will be independent in self MLD  for long term management of lymphedema.  Baseline:  Goal status: INITIAL  2. Pt will obtain have/obtain appropriate compression garments for long term management of lymphedema.  Baseline: Goal status: INITIAL   3.  Pt will be more compliant with compression garments to maintain edema reduction Baseline:  Goal status: INITIAL    PLAN:  PT FREQUENCY: 2x/week PT DURATION: 4  weeks   PLANNED INTERVENTIONS: Therapeutic exercises, Patient/Family education, Self Care, Orthotic/Fit training, DME instructions, Manual lymph drainage, Compression bandaging, Vasopneumatic device, and Manual therapy   PLAN FOR NEXT SESSION: Is pt still having symptoms she relates to Kenalog injection? Did she see MD? begin MLD to bilateral LE and instruct/review self MLD technique, look at old compression garments and determine if she needs new ones. Encourage compliance with compression.   Waynette Buttery, PT 10/13/2022, 4:03 PM

## 2022-10-14 DIAGNOSIS — M1711 Unilateral primary osteoarthritis, right knee: Secondary | ICD-10-CM | POA: Diagnosis not present

## 2022-10-15 ENCOUNTER — Ambulatory Visit: Payer: Medicare HMO

## 2022-10-15 DIAGNOSIS — I214 Non-ST elevation (NSTEMI) myocardial infarction: Secondary | ICD-10-CM | POA: Diagnosis not present

## 2022-10-15 DIAGNOSIS — Z8542 Personal history of malignant neoplasm of other parts of uterus: Secondary | ICD-10-CM

## 2022-10-15 DIAGNOSIS — R079 Chest pain, unspecified: Secondary | ICD-10-CM | POA: Diagnosis not present

## 2022-10-15 DIAGNOSIS — R918 Other nonspecific abnormal finding of lung field: Secondary | ICD-10-CM | POA: Diagnosis not present

## 2022-10-15 DIAGNOSIS — I890 Lymphedema, not elsewhere classified: Secondary | ICD-10-CM

## 2022-10-15 NOTE — Therapy (Signed)
OUTPATIENT PHYSICAL THERAPY  LOWER EXTREMITY ONCOLOGY TREATMENT  Patient Name: Beth Simon MRN: [id number] DOB:1940-05-17, 82 y.o., female Today's Date: 10/15/2022  END OF SESSION:  PT End of Session - 10/15/22 1101     Visit Number 7    Number of Visits 12    Date for PT Re-Evaluation 11/03/22    PT Start Time 1102    PT Stop Time 1151    PT Time Calculation (min) 49 min    Activity Tolerance Patient tolerated treatment well    Behavior During Therapy [facility] for tasks assessed/performed             Past Medical History:  Diagnosis Date   Adenomatous polyps 01/2016   Anemia    Breast cancer (HCC) 1990   bilateral mastectomy, adenoca breast-left MRM, reconstruction, chemo   Bronchitis last 2 weeks   saw dr Pete Glatter 02-28-2014, he said no antibiotics needed, nonproductive cough   Complication of anesthesia    Cystitis    cytoxen, had once or twice   Family history of breast cancer    History of breast cancer    History of radiation therapy 2/10, 2/12, 2/18, 2/24, 05/29/14   vaginal cuff/ 30 Gy/ 5 fx   Hypertension    Macular degeneration    Neck pain    taking physictal therapy last 2 weeks   Osteoporosis    On Prolia.   PONV (postoperative nausea and vomiting)    Shoulder pain    taking physical therapy for last few weeks   Uterine cancer (HCC) 03/21/2014   MLH1/PMS2 LOH   Uterine fibroid    VAIN I (vaginal intraepithelial neoplasia grade I) 2018   positive HR HPV.   Vasovagal syncopes    Past Surgical History:  Procedure Laterality Date   BREAST IMPLANT REMOVAL  6/94   left breast   BREAST RECONSTRUCTION  1990   left   CATARACT EXTRACTION Bilateral    EXCISION VAGINAL CYST     MASTECTOMY Right 10/08/06   prophylactic   RADICAL MASTECTOMY LND  1990   left, chemo done   ROBOTIC ASSISTED TOTAL HYSTERECTOMY WITH BILATERAL SALPINGO OOPHERECTOMY Bilateral 03/21/2014   Procedure: ROBOTIC ASSISTED TOTAL HYSTERECTOMY WITH BILATERAL SALPINGO OOPHORECTOMY ;   Surgeon: Laurette Schimke, MD;  Location: [facility];  Service: Gynecology;  Laterality: Bilateral;   Patient Active Problem List   Diagnosis Date Noted   Age-related osteoporosis without current pathological fracture 03/19/2020   Personal history of colonic polyps 03/19/2020   Transient cerebral ischemia 09/15/2017   Pure hypercholesterolemia 09/15/2017   Pulmonary nodule 09/15/2017   Hypertension 09/15/2017   Gastro-esophageal reflux disease without esophagitis 09/15/2017   VAIN I (vaginal intraepithelial neoplasia grade I) 06/23/2017   Genetic testing 05/23/2014   History of breast cancer    Family history of breast cancer    Endometrial cancer (HCC) 03/21/2014   Uterine cancer (HCC) 03/21/2014   S/P mastectomy, bilateral 05/02/2013   Situational stress 05/02/2013   Osteoporosis, unspecified 05/02/2013   Breast cancer, left breast (HCC) 05/02/2013    PCP: Merlene Laughter, MD  REFERRING PROVIDER: Wyvonnia Lora MD  REFERRING DIAG: LE Lymphedema  THERAPY DIAG:  Lymphedema, not elsewhere classified  History of uterine cancer  ONSET DATE: around 05/2021  Rationale for Evaluation and Treatment: Rehabilitation  SUBJECTIVE:  SUBJECTIVE STATEMENT:  10/15/2022 I went through my instruction sheet, and I felt better with it now. I drew a diagram and it helps  09/22/2022 EVAL Pt returns due to swelling in her legs, left greater than right. It comes and goes.. This morning it  seems to be down. The socks we ordered online were really good, but hot,but I ordered some online at [facility] that were thinner. When I was in [place] we did a great deal of walking. I have done my MLD occasionally and do both legs but I feel I need a refresher.. She had an injection in her right knee last Tuesday with Kenalog and she had some  strange side effects; it made me feel weak in my arms and legs and caused some tingling in my arms and legs. The sensation is still there but not as severe. I have been inactive today so my legs look pretty good. Since the injection last week and feeling funny I have not done much. (Pt advised to follow up with MD about symptoms she is still having) She will be starting PT at [facility] for Right knee pain.  PERTINENT HISTORY:  History notable for left breast cancer ER-PR-  treated with radical mastectomy and chemotherapy in 1990.  Subsequently underwent right simple mastectomy 2008 without reconstruction. Denies use of tamoxifen  On 03/21/14  she underwent a robotic hysterectomy for treatment of uterine cancer, BSO. Frozen section revealed grade 1 cancer. Final pathology revealed a grade 2 lesion with LVSI present and she was staged at stage IA. She was recommended vaginal brachytherapy as adjuvant therapy to reduce risk for local recurrence. Dr Karoline Caldwell delivered vaginal brachytherapy completed on 05/29/14. Her treatments were without complication. Pt had therapy from 11/12/21 to 01/08/22 for bilateral LE swelling. PAIN:  Are you having pain? No  PRECAUTIONS: Bilateral LE lymphedema,s/p Left breast cancer  WEIGHT BEARING RESTRICTIONS: No  FALLS:  Has patient fallen in last 6 months? No  LIVING ENVIRONMENT: Lives with: lives alone Lives in: House/apartment Stairs: No;  Has following equipment at home: None  OCCUPATION: Retired  LEISURE: walks, Pilates with Roanna Raider  PRIOR LEVEL OF FUNCTION: Independent  PATIENT GOALS: Review self MLD,check present compression garments and purchase new prn,    OBJECTIVE:  COGNITION: Overall cognitive status: Within functional limits for tasks assessed   PALPATION: Tenderness left lower leg with light palpation, not so on right. Very mild pitting bilaterally mostly foot/ankle  OBSERVATIONS / OTHER ASSESSMENTS: multiple varicosities noted  bilateral LE's  SENSATION: Light touch: Appears intact  POSTURE: Forward head rounded shoulders  LYMPHEDEMA ASSESSMENTS:   SURGERY TYPE/DATE: uterine cancer with total hysterectomy in 2016, 1990 L mastectomy and ALND, 2008 R mastectomy   NUMBER OF LYMPH NODES REMOVED: 0  CHEMOTHERAPY: completed for L breast cancer   RADIATION:completed  HORMONE TREATMENT: NONE  INFECTIONS: NONE  LYMPHEDEMA ASSESSMENTS:   LOWER EXTREMITY LANDMARK RIGHT eval RIGHT 10/09/2022 RIGHT 10/15/2022  At groin     30 cm proximal to suprapatella     20 cm proximal to suprapatella     10 cm proximal to suprapatella     At midpatella / popliteal crease     30 cm proximal to floor at lateral plantar foot 36 36 36.2  20 cm proximal to floor at lateral plantar foot 28.2 28.3 28.Marland Kitchen5  10 cm proximal to floor at lateral plantar foot 22.0 22.1 22.2  Circumference of ankle/heel     5 cm proximal to 1st MTP joint 20.3  Across MTP joint 20.5 20.3 20.3  Around proximal great toe 7 7 7.0  (Blank rows = not tested)  LOWER EXTREMITY LANDMARK LEFT eval LEFT 10/09/2022 LEFT 10/15/2022  At groin     30 cm proximal to suprapatella     20 cm proximal to suprapatella     10 cm proximal to suprapatella     At midpatella / popliteal crease     30 cm proximal to floor at lateral plantar foot 36.6 36.7 36.6  20 cm proximal to floor at lateral plantar foot 29.1 29.3 28.5  10 cm proximal to floor at lateral plantar foot 23.2 22.5 23.4  Circumference of ankle/heel     5 cm proximal to 1st MTP joint 20.7    Across MTP joint 20.5 20.3 20.4  Around proximal great toe 7.3 7.1 7.1  (Blank rows = not tested)  GAIT: Distance walked: 40 ft Assistive device utilized: None Level of assistance: Complete Independence Comments: normal gait, no difficulties  Outcome measure:  TODAY'S TREATMENT:                                                                                                                                           DATE:  10/15/2022  MLD: In Supine with HOB elevated and legs elevated on large bolster and smaller round bolster: Short neck, 5 diaphragmatic breaths, Lt inguinal nodes, (no anastomosis due to Lt ALND in 1990); and then Lt LE working from proximal to distal, then retracing all steps (with intact sequence) and ending with LN's . Pt erformed  all steps and then therapist performed all  steps on left and right LE as below. She did very well with only a few cues required. MLD to the right LE using the right axillary Lns. Therapist then activated right LN's a axillary LN's,right  inguinal-axillary pathway and Right LE lateral thigh, then medial to lateral retracing steps, knee and lower leg retracing steps then right foot retracing steps and ending with LN"S. Therapist performed all on this side with verbalization to pt.  10/13/2022   MLD: In Supine with HOB elevated and legs elevated on large bolster and smaller round bolster: Short neck, 5 diaphragmatic breaths, Lt inguinal nodes, (no anastomosis due to Lt ALND in 1990); and then Lt LE working from proximal to distal, then retracing all steps (with intact sequence) and ending with LN's . Therpist performed steps and then had pt repeat steps verbally and with use of hands.. She did very well with occasional VC and TC's MLD to the right LE using the right axillary Lns. Therapist then activated right LN's a axillary LN's,right  inguinal-axillary pathway and Right LE lateral thigh, then medial to lateral retracing steps, knee and lower leg retracing steps then right foot retracing steps and ending with LN"S. Therapist performed all on this side with verbalization to pt. Pt given handout demonstrating proper  sequences  10/09/2022 Measured pts legs bilaterally and they remain stable with her 8-15 mmHG compression stockings MLD: In Supine with HOB elevated and legs elevated on large bolster and smaller round bolster: Short neck, 5 diaphragmatic breaths, Lt  inguinal nodes, (no anastomosis due to Lt ALND in 1990); and then Lt LE working from proximal to distal, then retracing all steps (with intact sequence) and ending with LN's . Therpist performed steps and then had pt repeat steps verbally and with use of hands.. She did very well with occasional VC and TC's MLD to the right LE using the right axillary Lns. Therapist then activated right LN's a axillary LN's,right  inguinal-axillary pathway and Right LE lateral thigh, then medial to lateral retracing steps, knee and lower leg retracing steps then right foot retracing steps and ending with LN"S. Therapist performed all on this side with verbalization to pt.  10/07/2022  MLD: In Supine with HOB elevated and legs elevated on large bolster and smaller round bolster: Short neck, 5 diaphragmatic breaths, Lt inguinal nodes, (no anastomosis due to Lt ALND in 1990); and then Lt LE working from proximal to distal, then retracing all steps (with intact sequence) and ending with LN's . Therpist performed steps and then had pt repeat all. She did very well with occasional VC and TC's MLD to the right LE using the right axillary Lns. Therapist then activated right LN's a axillary LN's,right  inguinal-axillary pathway and Right LE lateral thigh, then medial to lateral retracing steps, knee and lower leg retracing steps then right foot retracing steps and ending with LN"S. Therapist performed all on this side with verbalization to pt.    09/30/2022  Pt brought her Juzo 15-20 compression socks with her but they are too warm for this time of year per pt. Removed her 8-15 MM Hg stockings and her legs look good MLD: In Supine with HOB elevated and legs elevated on large bolster and smaller round bolster: Short neck, 5 diaphragmatic breaths, Lt inguinal nodes, (no anastomosis due to Lt ALND in 1990); and then Lt LE working from proximal to distal, then retracing all steps (with intact sequence) and ending with LN's  MLD to the  right LE using the right axillary Lns. Therapist then activated right LN's and had pt demonstrate each of the steps including axillary LN's,right  inguinal-axillary pathway and Right LE lateral thigh, then medial to lateral retracing steps, knee and lower leg retracing steps then right foot retracing steps and ending with LN"S.   09/24/22  Pt removed compression stockings; wearing [employer] knee high compression 8-15 mmhg, and legs look OK today. Pt was able to don and doff independently MLD: In Supine with HOB elevated and legs elevated on large bolster and smaller round bolster: Short neck, 5 diaphragmatic breaths, Lt inguinal nodes, (no anastomosis due to Lt ALND in 1990); and then Lt LE working from proximal to distal, then retracing all steps (with intact sequence) and ending with LN's  MLD to the right LE using the right axillary Lns. Therapist then activated right LN's and had pt demonstrate each of the steps including axillary LN's,right  inguinal-axillary pathway and Right LE lateral thigh, then medial to lateral retracing steps, knee and lower leg retracing steps then right foot retracing steps and ending with LN"S.Pt practiced each step on the right and needs moderate VC's and visual cues.  6/24/24MLD: In Supine with HOB elevated and legs elevated on large bolster with pillows under knees: Short neck, 5 diaphragmatic breaths,  Lt inguinal nodes, (no anastomosis due to Lt ALND in 1990); and then Lt LE working from proximal to distal, then retracing all steps (with intact sequence) but will continue intact for remaining sessions for Lt LE) /24/2024 Initiated MLD to left lower extremity; short neck, 5 breaths, left inguinal LN's, Lateral knee to thigh, then inner thigh to outer, repeating lateral thigh, then ankle to knee repeating both sides of leg , retracing steps  lateral thigh to hip, then left foot retracing all steps and ending with LN's at left groin.  PATIENT EDUCATION:  Education details: POC,  bring garments next discussed legs will swell if not wearing compression Person educated: Patient Education method: Explanation and Demonstration Education comprehension: verbalized understanding, requires further education  HOME EXERCISE PROGRAM: None given today  ASSESSMENT:  CLINICAL IMPRESSION: Pt did very well with review of her MLD using  good pressure  and  technique and demonstrating a good understanding of sequence . She felt the same lightness in her leg after performing MLD that she feels with therapist performing.  OBJECTIVE IMPAIRMENTS: decreased knowledge of condition, decreased knowledge of use of DME, and increased edema.   ACTIVITY LIMITATIONS:  no limitations but legs swell more with being on feet for prolong periods or sitting prolonged periods  PARTICIPATION LIMITATIONS:  none  PERSONAL FACTORS:  none  are also affecting patient's functional outcome.   REHAB POTENTIAL: Excellent  CLINICAL DECISION MAKING: Stable/uncomplicated  EVALUATION COMPLEXITY: Low   GOALS: Goals reviewed with patient? Yes  SHORT TERM GOALS=LONG TERM GOALS: Target date: 10/20/2022     Pt will be independent in self MLD for long term management of lymphedema.  Baseline:  Goal status: INITIAL  2. Pt will obtain have/obtain appropriate compression garments for long term management of lymphedema.  Baseline: Goal status: INITIAL   3.  Pt will be more compliant with compression garments to maintain edema reduction Baseline:  Goal status: INITIAL    PLAN:  PT FREQUENCY: 2x/week PT DURATION: 4 weeks   PLANNED INTERVENTIONS: Therapeutic exercises, Patient/Family education, Self Care, Orthotic/Fit training, DME instructions, Manual lymph drainage, Compression bandaging, Vasopneumatic device, and Manual therapy   PLAN FOR NEXT SESSION: Is pt still having symptoms she relates to Kenalog injection? Did she see MD? begin MLD to bilateral LE and instruct/review self MLD technique,  look at old compression garments and determine if she needs new ones. Encourage compliance with compression.   Waynette Buttery, PT 10/15/2022, 11:52 AM

## 2022-10-15 NOTE — Progress Notes (Signed)
Subjective: Chief Complaint  Patient presents with   Follow-up    Athletes foot- patient has been uisng the cream. Patient believes it is completely gone.    Nail Problem    Routine Foot Care-  nail trim    Callouses    Callus trim     82 y.o. returns the office today for painful, elongated, thickened toenails which she is unable to trim herself.  States the nails get painful to get elongated.  No open lesions. The athlete's foot is much improved.  Objective: AAO 3, NAD DP/PT pulses palpable, CRT less than 3 seconds Chronic swelling present bilaterally.  There is no open lesions. Nails hypertrophic, dystrophic, elongated, brittle, discolored 10. There is tenderness overlying the nails 1-5 bilaterally. There is no surrounding erythema or drainage along the nail sites.  Ingrown toenails present with any signs of infection.  Minimal hyperkeratotic lesions of the distal aspect left second digit. No underlying ulceration, drainage or other signs of infection.  No pain with calf compression, warmth, erythema.  Assessment: Symptomatic onychomycosis, ingrown toenails; hyperkeratotic lesion left foot  Plan: -Treatment options including alternatives, risks, complications were discussed -Nails sharply debrided 10 without any complications or bleeding.  Nails have gotten longer short to have missing appointments or getting ingrown.  Monitoring signs or symptoms of infection. -Hyperkeratotic lesion sharply debrided 1 without complication/bleeding as a courtesy as it was minimal today.  Continue offloading. -Lotrisone for tinea pedis prn as it is much improved.   Ovid Curd, DPM

## 2022-10-17 ENCOUNTER — Inpatient Hospital Stay (HOSPITAL_COMMUNITY)
Admission: EM | Admit: 2022-10-17 | Discharge: 2022-10-19 | DRG: 280 | Disposition: A | Discharge status: Home Health | Payer: Medicare HMO | Attending: Internal Medicine | Admitting: Internal Medicine

## 2022-10-17 ENCOUNTER — Other Ambulatory Visit: Payer: Self-pay

## 2022-10-17 ENCOUNTER — Emergency Department (HOSPITAL_COMMUNITY): Payer: Medicare HMO

## 2022-10-17 ENCOUNTER — Encounter (HOSPITAL_COMMUNITY): Payer: Self-pay

## 2022-10-17 ENCOUNTER — Observation Stay (HOSPITAL_COMMUNITY): Payer: Medicare HMO

## 2022-10-17 ENCOUNTER — Encounter (HOSPITAL_COMMUNITY)
Admission: EM | Disposition: A | Discharge status: Home Health | Payer: Self-pay | Source: Home / Self Care | Attending: Internal Medicine

## 2022-10-17 DIAGNOSIS — Z8249 Family history of ischemic heart disease and other diseases of the circulatory system: Secondary | ICD-10-CM

## 2022-10-17 DIAGNOSIS — I5041 Acute combined systolic (congestive) and diastolic (congestive) heart failure: Secondary | ICD-10-CM | POA: Diagnosis present

## 2022-10-17 DIAGNOSIS — R918 Other nonspecific abnormal finding of lung field: Secondary | ICD-10-CM | POA: Diagnosis not present

## 2022-10-17 DIAGNOSIS — Z853 Personal history of malignant neoplasm of breast: Secondary | ICD-10-CM | POA: Diagnosis not present

## 2022-10-17 DIAGNOSIS — I2510 Atherosclerotic heart disease of native coronary artery without angina pectoris: Secondary | ICD-10-CM | POA: Diagnosis present

## 2022-10-17 DIAGNOSIS — R079 Chest pain, unspecified: Secondary | ICD-10-CM | POA: Diagnosis not present

## 2022-10-17 DIAGNOSIS — I10 Essential (primary) hypertension: Secondary | ICD-10-CM | POA: Diagnosis not present

## 2022-10-17 DIAGNOSIS — I5021 Acute systolic (congestive) heart failure: Secondary | ICD-10-CM | POA: Diagnosis not present

## 2022-10-17 DIAGNOSIS — Z888 Allergy status to other drugs, medicaments and biological substances status: Secondary | ICD-10-CM

## 2022-10-17 DIAGNOSIS — R002 Palpitations: Secondary | ICD-10-CM | POA: Diagnosis present

## 2022-10-17 DIAGNOSIS — Z923 Personal history of irradiation: Secondary | ICD-10-CM

## 2022-10-17 DIAGNOSIS — I214 Non-ST elevation (NSTEMI) myocardial infarction: Secondary | ICD-10-CM | POA: Diagnosis not present

## 2022-10-17 DIAGNOSIS — Z886 Allergy status to analgesic agent status: Secondary | ICD-10-CM

## 2022-10-17 DIAGNOSIS — Z820 Family history of epilepsy and other diseases of the nervous system: Secondary | ICD-10-CM

## 2022-10-17 DIAGNOSIS — E871 Hypo-osmolality and hyponatremia: Secondary | ICD-10-CM | POA: Diagnosis not present

## 2022-10-17 DIAGNOSIS — H3530 Unspecified macular degeneration: Secondary | ICD-10-CM | POA: Diagnosis present

## 2022-10-17 DIAGNOSIS — E785 Hyperlipidemia, unspecified: Secondary | ICD-10-CM | POA: Diagnosis not present

## 2022-10-17 DIAGNOSIS — R5383 Other fatigue: Secondary | ICD-10-CM | POA: Diagnosis not present

## 2022-10-17 DIAGNOSIS — R710 Precipitous drop in hematocrit: Secondary | ICD-10-CM | POA: Diagnosis present

## 2022-10-17 DIAGNOSIS — G4489 Other headache syndrome: Secondary | ICD-10-CM | POA: Diagnosis not present

## 2022-10-17 DIAGNOSIS — Z90710 Acquired absence of both cervix and uterus: Secondary | ICD-10-CM

## 2022-10-17 DIAGNOSIS — I252 Old myocardial infarction: Secondary | ICD-10-CM | POA: Diagnosis present

## 2022-10-17 DIAGNOSIS — I4510 Unspecified right bundle-branch block: Secondary | ICD-10-CM | POA: Diagnosis not present

## 2022-10-17 DIAGNOSIS — R202 Paresthesia of skin: Secondary | ICD-10-CM | POA: Diagnosis not present

## 2022-10-17 DIAGNOSIS — E7800 Pure hypercholesterolemia, unspecified: Secondary | ICD-10-CM | POA: Diagnosis present

## 2022-10-17 DIAGNOSIS — Z8542 Personal history of malignant neoplasm of other parts of uterus: Secondary | ICD-10-CM | POA: Diagnosis not present

## 2022-10-17 DIAGNOSIS — I110 Hypertensive heart disease with heart failure: Secondary | ICD-10-CM | POA: Diagnosis present

## 2022-10-17 DIAGNOSIS — Z9221 Personal history of antineoplastic chemotherapy: Secondary | ICD-10-CM

## 2022-10-17 DIAGNOSIS — I5181 Takotsubo syndrome: Secondary | ICD-10-CM | POA: Diagnosis present

## 2022-10-17 DIAGNOSIS — Z743 Need for continuous supervision: Secondary | ICD-10-CM | POA: Diagnosis not present

## 2022-10-17 DIAGNOSIS — Z803 Family history of malignant neoplasm of breast: Secondary | ICD-10-CM

## 2022-10-17 DIAGNOSIS — Z79899 Other long term (current) drug therapy: Secondary | ICD-10-CM

## 2022-10-17 DIAGNOSIS — M810 Age-related osteoporosis without current pathological fracture: Secondary | ICD-10-CM | POA: Diagnosis present

## 2022-10-17 DIAGNOSIS — Z9013 Acquired absence of bilateral breasts and nipples: Secondary | ICD-10-CM

## 2022-10-17 HISTORY — PX: RIGHT/LEFT HEART CATH AND CORONARY ANGIOGRAPHY: CATH118266

## 2022-10-17 LAB — POCT I-STAT EG7
Acid-Base Excess: 0 mmol/L (ref 0.0–2.0)
Acid-base deficit: 2 mmol/L (ref 0.0–2.0)
Bicarbonate: 22.2 mmol/L (ref 20.0–28.0)
Bicarbonate: 23.8 mmol/L (ref 20.0–28.0)
Calcium, Ion: 1.11 mmol/L — ABNORMAL LOW (ref 1.15–1.40)
Calcium, Ion: 1.16 mmol/L (ref 1.15–1.40)
HCT: 40 % (ref 36.0–46.0)
HCT: 43 % (ref 36.0–46.0)
Hemoglobin: 13.6 g/dL (ref 12.0–15.0)
Hemoglobin: 14.6 g/dL (ref 12.0–15.0)
O2 Saturation: 63 %
O2 Saturation: 64 %
Potassium: 3.8 mmol/L (ref 3.5–5.1)
Potassium: 3.9 mmol/L (ref 3.5–5.1)
Sodium: 135 mmol/L (ref 135–145)
Sodium: 135 mmol/L (ref 135–145)
TCO2: 23 mmol/L (ref 22–32)
TCO2: 25 mmol/L (ref 22–32)
pCO2, Ven: 35 mmHg — ABNORMAL LOW (ref 44–60)
pCO2, Ven: 35.2 mmHg — ABNORMAL LOW (ref 44–60)
pH, Ven: 7.41 (ref 7.25–7.43)
pH, Ven: 7.437 — ABNORMAL HIGH (ref 7.25–7.43)
pO2, Ven: 31 mmHg — CL (ref 32–45)
pO2, Ven: 33 mmHg (ref 32–45)

## 2022-10-17 LAB — BASIC METABOLIC PANEL
Anion gap: 18 — ABNORMAL HIGH (ref 5–15)
BUN: 12 mg/dL (ref 8–23)
CO2: 20 mmol/L — ABNORMAL LOW (ref 22–32)
Calcium: 9.5 mg/dL (ref 8.9–10.3)
Chloride: 95 mmol/L — ABNORMAL LOW (ref 98–111)
Creatinine, Ser: 0.82 mg/dL (ref 0.44–1.00)
GFR, Estimated: >60 mL/min (ref >60)
Glucose, Bld: 110 mg/dL — ABNORMAL HIGH (ref 70–99)
Potassium: 3.6 mmol/L (ref 3.5–5.1)
Sodium: 133 mmol/L — ABNORMAL LOW (ref 135–145)

## 2022-10-17 LAB — HEMOGLOBIN A1C
Hgb A1c MFr Bld: 5.7 % — ABNORMAL HIGH (ref 4.8–5.6)
Mean Plasma Glucose: 116.89 mg/dL

## 2022-10-17 LAB — CBC
HCT: 39.1 % (ref 36.0–46.0)
Hemoglobin: 13 g/dL (ref 12.0–15.0)
MCH: 30.3 pg (ref 26.0–34.0)
MCHC: 33.2 g/dL (ref 30.0–36.0)
MCV: 91.1 fL (ref 80.0–100.0)
Platelets: 242 10*3/uL (ref 150–400)
RBC: 4.29 MIL/uL (ref 3.87–5.11)
RDW: 13.2 % (ref 11.5–15.5)
WBC: 10.5 10*3/uL (ref 4.0–10.5)
nRBC: 0 % (ref 0.0–0.2)

## 2022-10-17 LAB — ECHOCARDIOGRAM COMPLETE
Area-P 1/2: 3.95 cm2
Calc EF: 50.4 %
Height: 58 in
S' Lateral: 3.2 cm
Single Plane A2C EF: 50.2 %
Single Plane A4C EF: 48.5 %
Weight: 2144 oz

## 2022-10-17 LAB — POCT I-STAT 7, (LYTES, BLD GAS, ICA,H+H)
Acid-base deficit: 3 mmol/L — ABNORMAL HIGH (ref 0.0–2.0)
Bicarbonate: 21 mmol/L (ref 20.0–28.0)
Calcium, Ion: 1.16 mmol/L (ref 1.15–1.40)
HCT: 42 % (ref 36.0–46.0)
Hemoglobin: 14.3 g/dL (ref 12.0–15.0)
O2 Saturation: 94 %
Potassium: 4 mmol/L (ref 3.5–5.1)
Sodium: 134 mmol/L — ABNORMAL LOW (ref 135–145)
TCO2: 22 mmol/L (ref 22–32)
pCO2 arterial: 33.2 mmHg (ref 32–48)
pH, Arterial: 7.41 (ref 7.35–7.45)
pO2, Arterial: 69 mmHg — ABNORMAL LOW (ref 83–108)

## 2022-10-17 LAB — TROPONIN I (HIGH SENSITIVITY)
Troponin I (High Sensitivity): 2054 ng/L (ref <18)
Troponin I (High Sensitivity): 53 ng/L — ABNORMAL HIGH (ref <18)

## 2022-10-17 LAB — LIPID PANEL
Cholesterol: 205 mg/dL — ABNORMAL HIGH (ref 0–200)
HDL: 78 mg/dL (ref >40)
LDL Cholesterol: 111 mg/dL — ABNORMAL HIGH (ref 0–99)
Total CHOL/HDL Ratio: 2.6 RATIO
Triglycerides: 79 mg/dL (ref <150)
VLDL: 16 mg/dL (ref 0–40)

## 2022-10-17 LAB — BRAIN NATRIURETIC PEPTIDE: B Natriuretic Peptide: 33.3 pg/mL (ref 0.0–100.0)

## 2022-10-17 LAB — I-STAT CHEM 8, ED
BUN: 12 mg/dL (ref 8–23)
Calcium, Ion: 1.1 mmol/L — ABNORMAL LOW (ref 1.15–1.40)
Chloride: 99 mmol/L (ref 98–111)
Creatinine, Ser: 0.8 mg/dL (ref 0.44–1.00)
Glucose, Bld: 110 mg/dL — ABNORMAL HIGH (ref 70–99)
HCT: 39 % (ref 36.0–46.0)
Hemoglobin: 13.3 g/dL (ref 12.0–15.0)
Potassium: 3.5 mmol/L (ref 3.5–5.1)
Sodium: 132 mmol/L — ABNORMAL LOW (ref 135–145)
TCO2: 23 mmol/L (ref 22–32)

## 2022-10-17 LAB — TSH: TSH: 2.258 u[IU]/mL (ref 0.350–4.500)

## 2022-10-17 LAB — MAGNESIUM: Magnesium: 2.1 mg/dL (ref 1.7–2.4)

## 2022-10-17 SURGERY — RIGHT/LEFT HEART CATH AND CORONARY ANGIOGRAPHY
Anesthesia: LOCAL

## 2022-10-17 MED ORDER — ASPIRIN 81 MG PO TBEC
81.0000 mg | DELAYED_RELEASE_TABLET | Freq: Every day | ORAL | Status: DC
  Administered 2022-10-17 – 2022-10-19 (×3): 81 mg via ORAL
  Filled 2022-10-17 (×2): qty 1

## 2022-10-17 MED ORDER — SODIUM CHLORIDE 0.9 % IV SOLN: INTRAVENOUS | Status: DC

## 2022-10-17 MED ORDER — ATORVASTATIN CALCIUM 10 MG PO TABS
10.0000 mg | ORAL_TABLET | Freq: Every day | ORAL | Status: DC
  Administered 2022-10-17: 10 mg via ORAL

## 2022-10-17 MED ORDER — VERAPAMIL HCL 2.5 MG/ML IV SOLN
INTRAVENOUS | Status: AC
  Filled 2022-10-17: qty 2

## 2022-10-17 MED ORDER — LIDOCAINE HCL (PF) 1 % IJ SOLN
INTRAMUSCULAR | Status: AC
  Filled 2022-10-17: qty 30

## 2022-10-17 MED ORDER — IOHEXOL 350 MG/ML SOLN
INTRAVENOUS | Status: DC | PRN
  Administered 2022-10-17: 45 mL

## 2022-10-17 MED ORDER — ATORVASTATIN CALCIUM 10 MG PO TABS
ORAL_TABLET | ORAL | Status: AC
  Filled 2022-10-17: qty 1

## 2022-10-17 MED ORDER — SODIUM CHLORIDE 0.9% FLUSH: 3.0000 mL | INTRAVENOUS | Status: DC | PRN

## 2022-10-17 MED ORDER — HYDRALAZINE HCL 20 MG/ML IJ SOLN: 10.0000 mg | INTRAMUSCULAR | Status: DC | PRN

## 2022-10-17 MED ORDER — HEPARIN SODIUM (PORCINE) 1000 UNIT/ML IJ SOLN
INTRAMUSCULAR | Status: AC
  Filled 2022-10-17: qty 10

## 2022-10-17 MED ORDER — CALCIUM GLUCONATE-NACL 1-0.675 GM/50ML-% IV SOLN
1.0000 g | Freq: Once | INTRAVENOUS | Status: AC
  Administered 2022-10-17: 1000 mg via INTRAVENOUS

## 2022-10-17 MED ORDER — HEPARIN BOLUS VIA INFUSION
3200.0000 [IU] | Freq: Once | INTRAVENOUS | Status: AC
  Administered 2022-10-17: 3200 [IU] via INTRAVENOUS
  Filled 2022-10-17: qty 3200

## 2022-10-17 MED ORDER — HEPARIN SODIUM (PORCINE) 1000 UNIT/ML IJ SOLN
INTRAMUSCULAR | Status: DC | PRN
  Administered 2022-10-17: 3000 [IU] via INTRAVENOUS

## 2022-10-17 MED ORDER — NITROGLYCERIN IN D5W 200-5 MCG/ML-% IV SOLN
0.0000 ug/min | INTRAVENOUS | Status: DC
  Administered 2022-10-17: 10 ug/min via INTRAVENOUS
  Administered 2022-10-17: 5 ug/min via INTRAVENOUS

## 2022-10-17 MED ORDER — SODIUM CHLORIDE 0.9 % IV SOLN: INTRAVENOUS | Status: AC

## 2022-10-17 MED ORDER — CLOPIDOGREL BISULFATE 75 MG PO TABS
75.0000 mg | ORAL_TABLET | Freq: Every day | ORAL | Status: DC
  Administered 2022-10-18 – 2022-10-19 (×2): 75 mg via ORAL
  Filled 2022-10-17: qty 1

## 2022-10-17 MED ORDER — ATORVASTATIN CALCIUM 40 MG PO TABS
ORAL_TABLET | ORAL | Status: AC
  Filled 2022-10-17: qty 1

## 2022-10-17 MED ORDER — HEPARIN (PORCINE) 25000 UT/250ML-% IV SOLN
INTRAVENOUS | Status: AC
  Filled 2022-10-17: qty 250

## 2022-10-17 MED ORDER — ONDANSETRON HCL 4 MG/2ML IJ SOLN: 4.0000 mg | Freq: Four times a day (QID) | INTRAMUSCULAR | Status: DC | PRN

## 2022-10-17 MED ORDER — VERAPAMIL HCL 2.5 MG/ML IV SOLN
INTRAVENOUS | Status: DC | PRN
  Administered 2022-10-17: 10 mL via INTRA_ARTERIAL

## 2022-10-17 MED ORDER — PERFLUTREN LIPID MICROSPHERE
1.0000 mL | INTRAVENOUS | Status: AC | PRN
  Administered 2022-10-17: 2 mL via INTRAVENOUS

## 2022-10-17 MED ORDER — ASPIRIN 81 MG PO CHEW: 81.0000 mg | CHEWABLE_TABLET | ORAL | Status: DC

## 2022-10-17 MED ORDER — HEPARIN (PORCINE) 25000 UT/250ML-% IV SOLN
800.0000 [IU]/h | INTRAVENOUS | Status: AC
  Administered 2022-10-18: 650 [IU]/h via INTRAVENOUS
  Administered 2022-10-19: 800 [IU]/h via INTRAVENOUS
  Filled 2022-10-17: qty 250

## 2022-10-17 MED ORDER — ACETAMINOPHEN 325 MG PO TABS: 650.0000 mg | ORAL_TABLET | ORAL | Status: DC | PRN

## 2022-10-17 MED ORDER — LABETALOL HCL 5 MG/ML IV SOLN: 10.0000 mg | INTRAVENOUS | Status: DC | PRN

## 2022-10-17 MED ORDER — HEPARIN (PORCINE) 25000 UT/250ML-% IV SOLN
650.0000 [IU]/h | INTRAVENOUS | Status: DC
  Administered 2022-10-17: 650 [IU]/h via INTRAVENOUS

## 2022-10-17 MED ORDER — MIDAZOLAM HCL 2 MG/2ML IJ SOLN
INTRAMUSCULAR | Status: AC
  Filled 2022-10-17: qty 2

## 2022-10-17 MED ORDER — SODIUM CHLORIDE 0.9 % IV SOLN: 250.0000 mL | INTRAVENOUS | Status: DC | PRN

## 2022-10-17 MED ORDER — MIDAZOLAM HCL 2 MG/2ML IJ SOLN
INTRAMUSCULAR | Status: DC | PRN
  Administered 2022-10-17: 1 mg via INTRAVENOUS

## 2022-10-17 MED ORDER — SODIUM CHLORIDE 0.9% FLUSH
3.0000 mL | Freq: Two times a day (BID) | INTRAVENOUS | Status: DC
  Administered 2022-10-18 – 2022-10-19 (×3): 3 mL via INTRAVENOUS

## 2022-10-17 MED ORDER — CLOPIDOGREL BISULFATE 75 MG PO TABS
300.0000 mg | ORAL_TABLET | Freq: Once | ORAL | Status: DC
  Filled 2022-10-17 (×2): qty 4

## 2022-10-17 MED ORDER — LISINOPRIL 5 MG PO TABS
5.0000 mg | ORAL_TABLET | Freq: Every day | ORAL | Status: DC
  Administered 2022-10-17: 5 mg via ORAL

## 2022-10-17 MED ORDER — LISINOPRIL 10 MG PO TABS
ORAL_TABLET | ORAL | Status: AC
  Filled 2022-10-17: qty 1

## 2022-10-17 MED ORDER — LIDOCAINE HCL (PF) 1 % IJ SOLN
INTRAMUSCULAR | Status: DC | PRN
  Administered 2022-10-17 (×2): 2 mL

## 2022-10-17 MED ORDER — SODIUM CHLORIDE 0.9 % IV BOLUS: 500.0000 mL | Freq: Once | INTRAVENOUS | Status: DC | PRN

## 2022-10-17 MED ORDER — CALCIUM GLUCONATE-NACL 1-0.675 GM/50ML-% IV SOLN
INTRAVENOUS | Status: AC
  Filled 2022-10-17: qty 50

## 2022-10-17 MED ORDER — ASPIRIN 81 MG PO TBEC
DELAYED_RELEASE_TABLET | ORAL | Status: AC
  Filled 2022-10-17: qty 1

## 2022-10-17 MED ORDER — NITROGLYCERIN IN D5W 200-5 MCG/ML-% IV SOLN
INTRAVENOUS | Status: AC
  Filled 2022-10-17: qty 250

## 2022-10-17 MED ORDER — HEPARIN (PORCINE) IN NACL 2000-0.9 UNIT/L-% IV SOLN
INTRAVENOUS | Status: DC | PRN
  Administered 2022-10-17 (×2): 1000 mL

## 2022-10-17 SURGICAL SUPPLY — 15 items
CATH BALLN WEDGE 5F 110CM (CATHETERS) IMPLANT
CATH INFINITI AMBI 5FR TG (CATHETERS) IMPLANT
CATH INFINITI JR4 5F (CATHETERS) IMPLANT
DEVICE RAD COMP TR BAND LRG (VASCULAR PRODUCTS) IMPLANT
GLIDESHEATH SLEND SS 6F .021 (SHEATH) IMPLANT
GUIDEWIRE INQWIRE 1.5J.035X260 (WIRE) IMPLANT
INQWIRE 1.5J .035X260CM (WIRE) ×1
KIT HEART LEFT (KITS) ×1 IMPLANT
KIT MICROPUNCTURE NIT STIFF (SHEATH) IMPLANT
PACK CARDIAC CATHETERIZATION (CUSTOM PROCEDURE TRAY) ×1 IMPLANT
SHEATH GLIDE SLENDER 4/5FR (SHEATH) IMPLANT
SHEATH PROBE COVER 6X72 (BAG) IMPLANT
SYR MEDRAD MARK 7 150ML (SYRINGE) ×1 IMPLANT
TRANSDUCER W/STOPCOCK (MISCELLANEOUS) ×1 IMPLANT
TUBING CIL FLEX 10 FLL-RA (TUBING) ×1 IMPLANT

## 2022-10-17 NOTE — TOC Initial Note (Signed)
Transition of Care [facility]) - Initial/Assessment Note    Patient Details  Name: Beth Simon MRN: [id number] Date of Birth: [birth year]  [facility] CM/SW Contact:    Beth Haven, RN Phone Number: 10/17/2022, 3:41 PM  Clinical Narrative:                 NCM spoke with Beth Maduro, patient son who is at the bedside, he states patient lives alone, she has PCP , Beth Simon, she has insurance on file.  She currently does not have any HH services in place, she does have a bp cuff and a scale at home.  Beth Maduro is her son and he is her support system, he will transport her home at dc.  She is self ambulatory pta. [facility] [address]   Expected Discharge Plan: Home w Home Health Services Barriers to Discharge: Continued Medical Work up   Patient Goals and CMS Choice Patient states their goals for this hospitalization and ongoing recovery are:: return home   Choice offered to / list presented to : NA      Expected Discharge Plan and Services In-house Referral: NA Discharge Planning Services: CM Consult Post Acute Care Choice: NA Living arrangements for the past 2 months: Single Family Home                 DME Arranged: N/A DME Agency: NA       HH Arranged: NA          Prior Living Arrangements/Services Living arrangements for the past 2 months: Single Family Home Lives with:: Self Patient language and need for interpreter reviewed:: Yes Do you feel safe going back to the place where you live?: Yes      Need for Family Participation in Patient Care: Yes (Comment) Care giver support system in place?: Yes (comment) Current home services: DME (bp cuff , scale) Criminal Activity/Legal Involvement Pertinent to Current Situation/Hospitalization: No - Comment as needed  Activities of Daily Living      Permission Sought/Granted                  Emotional Assessment       Orientation: : Oriented to Self, Oriented to Place,  Oriented to Situation, Oriented to  Time Alcohol / Substance Use: Not Applicable Psych Involvement: No (comment)  Admission diagnosis:  NSTEMI (non-ST elevated myocardial infarction) [facility]) [I21.4] Patient Active Problem List   Diagnosis Date Noted   NSTEMI (non-ST elevated myocardial infarction) (HCC) 10/17/2022   Palpitations 10/17/2022   Acute combined systolic and diastolic congestive heart failure (HCC) 10/17/2022   Hyperlipidemia 10/17/2022   Hyponatremia 10/17/2022   Fatigue 10/17/2022   History of breast cancer 10/17/2022   Age-related osteoporosis without current pathological fracture 03/19/2020   Personal history of colonic polyps 03/19/2020   Transient cerebral ischemia 09/15/2017   Pure hypercholesterolemia 09/15/2017   Pulmonary nodule 09/15/2017   Essential hypertension 09/15/2017   Gastro-esophageal reflux disease without esophagitis 09/15/2017   VAIN I (vaginal intraepithelial neoplasia grade I) 06/23/2017   Genetic testing 05/23/2014   History of uterine cancer    Family history of breast cancer    Endometrial cancer (HCC) 03/21/2014   Uterine cancer (HCC) 03/21/2014   S/P mastectomy, bilateral 05/02/2013   Situational stress 05/02/2013   Osteoporosis, unspecified 05/02/2013   Breast cancer, left breast (HCC) 05/02/2013   PCP:  Beth Laughter, MD (Inactive) Pharmacy:   [id number] [place], [place] - [address] [place] [address] Phone: [phone] Fax: [phone]     Social Determinants of Health (SDOH) Social History: SDOH Screenings   Tobacco Use: Low Risk  (10/17/2022)   SDOH Interventions:     Readmission Risk Interventions     No data to display

## 2022-10-17 NOTE — ED Notes (Signed)
ED TO INPATIENT HANDOFF REPORT  ED Nurse Name and Phone #: Tori [phone]  S Name/Age/Gender Beth Simon 82 y.o. female Room/Bed: TRAAC/TRAAC  Code Status   Code Status: Full Code  Home/SNF/Other Home Patient oriented to: self, place, time, and situation Is this baseline? Yes   Triage Complete: Triage complete  Chief Complaint NSTEMI (non-ST elevated myocardial infarction) [facility]) [I21.4]  Triage Note Pt called ems for high blood pressure. Said that she was packing tonight when her heart felt like it was racing, she took her bp and it was elevated. Said she did have a headache and her chest hurt slightly at a 1 on the pain scale.  Pt had 324 of baby aspirin and had 1 nitroglycerin prior to arrival.    Allergies Allergies  Allergen Reactions   Celecoxib Hypertension   Kenalog [Triamcinolone]     Leg weakness/burning  injection   Prednisone Other (See Comments)    Unsteady gait per MD instructions  NO STEROIDS   Procaine Palpitations    Heart palpitations    Level of Care/Admitting Diagnosis ED Disposition     ED Disposition  Admit   Condition  --   Comment  Hospital Area: [facility] [[id number]]  Level of Care: Telemetry Cardiac [[id number]]  May place patient in observation at [facility] or [facility] if equivalent level of care is available:: No  Covid Evaluation: Asymptomatic - no recent exposure (last 10 days) testing not required  Diagnosis: NSTEMI (non-ST elevated myocardial infarction) [facility]) [[id number]]  Admitting Physician: MARABELLE, CUSHMAN [[id number]]  Attending Physician: Clydie Braun [[id number]]          B Medical/Surgery History Past Medical History:  Diagnosis Date   Adenomatous polyps 01/2016   Anemia    Breast cancer (HCC) 1990   bilateral mastectomy, adenoca breast-left MRM, reconstruction, chemo   Bronchitis last 2 weeks   saw dr Pete Glatter 02-28-2014, he said no antibiotics needed, nonproductive cough   Complication of  anesthesia    Cystitis    cytoxen, had once or twice   Family history of breast cancer    History of breast cancer    History of radiation therapy 2/10, 2/12, 2/18, 2/24, 05/29/14   vaginal cuff/ 30 Gy/ 5 fx   Hypertension    Macular degeneration    Neck pain    taking physictal therapy last 2 weeks   Osteoporosis    On Prolia.   PONV (postoperative nausea and vomiting)    Shoulder pain    taking physical therapy for last few weeks   Uterine cancer (HCC) 03/21/2014   MLH1/PMS2 LOH   Uterine fibroid    VAIN I (vaginal intraepithelial neoplasia grade I) 2018   positive HR HPV.   Vasovagal syncopes    Past Surgical History:  Procedure Laterality Date   BREAST IMPLANT REMOVAL  6/94   left breast   BREAST RECONSTRUCTION  1990   left   CATARACT EXTRACTION Bilateral    EXCISION VAGINAL CYST     MASTECTOMY Right 10/08/06   prophylactic   RADICAL MASTECTOMY LND  1990   left, chemo done   ROBOTIC ASSISTED TOTAL HYSTERECTOMY WITH BILATERAL SALPINGO OOPHERECTOMY Bilateral 03/21/2014   Procedure: ROBOTIC ASSISTED TOTAL HYSTERECTOMY WITH BILATERAL SALPINGO OOPHORECTOMY ;  Surgeon: Laurette Schimke, MD;  Location: [facility] ORS;  Service: Gynecology;  Laterality: Bilateral;     A IV Location/Drains/Wounds Patient Lines/Drains/Airways Status     Active Line/Drains/Airways     Name Placement date  Placement time Site Days   Peripheral IV 10/17/22 20 G Anterior;Right Forearm 10/17/22  0045  Forearm  less than 1   Incision - 5 Ports Abdomen 1: Right;Lateral 2: Umbilicus;Upper 3: Left;Lateral;Upper 4: Left;Lateral;Medial 5: Left;Lateral;Lower 03/21/14  0815  -- 3132            Intake/Output Last 24 hours No intake or output data in the 24 hours ending 10/17/22 1253  Labs/Imaging Results for orders placed or performed during the hospital encounter of 10/17/22 (from the past 48 hour(s))  CBC     Status: None   Collection Time: 10/17/22 12:49 AM  Result Value Ref Range   WBC 10.5 4.0 -  10.5 K/uL   RBC 4.29 3.87 - 5.11 MIL/uL   Hemoglobin 13.0 12.0 - 15.0 g/dL   HCT 38.1 82.9 - 93.7 %   MCV 91.1 80.0 - 100.0 fL   MCH 30.3 26.0 - 34.0 pg   MCHC 33.2 30.0 - 36.0 g/dL   RDW 16.9 67.8 - 93.8 %   Platelets 242 150 - 400 K/uL   nRBC 0.0 0.0 - 0.2 %    Comment: Performed at [facility], [address], [place], [place] [address]  I-stat chem 8, ed     Status: Abnormal   Collection Time: 10/17/22 12:54 AM  Result Value Ref Range   Sodium 132 (L) 135 - 145 mmol/L   Potassium 3.5 3.5 - 5.1 mmol/L   Chloride 99 98 - 111 mmol/L   BUN 12 8 - 23 mg/dL   Creatinine, Ser 1.02 0.44 - 1.00 mg/dL   Glucose, Bld 585 (H) 70 - 99 mg/dL    Comment: Glucose reference range applies only to samples taken after fasting for at least 8 hours.   Calcium, Ion 1.10 (L) 1.15 - 1.40 mmol/L   TCO2 23 22 - 32 mmol/L   Hemoglobin 13.3 12.0 - 15.0 g/dL   HCT 27.7 82.4 - 23.5 %  Hemoglobin A1c     Status: Abnormal   Collection Time: 10/17/22 12:16 PM  Result Value Ref Range   Hgb A1c MFr Bld 5.7 (H) 4.8 - 5.6 %    Comment: (NOTE) Pre diabetes:          5.7%-6.4%  Diabetes:              >6.4%  Glycemic control for   <7.0% adults with diabetes    Mean Plasma Glucose 116.89 mg/dL    Comment: Performed at [facility], [address], [place], [place] [address]   ECHOCARDIOGRAM COMPLETE  Result Date: 10/17/2022    ECHOCARDIOGRAM REPORT   Patient Name:   Beth Simon Date of Exam: 10/17/2022 Medical Rec #:  [id number]       Height:       58.0 in Accession #:    [id number]      Weight:       134.0 lb Date of Birth:  February 13, 1941       BSA:          1.536 m Patient Age:    82 years        BP:           140/74 mmHg Patient Gender: F               HR:           78 bpm. Exam Location:  Inpatient Procedure: 2D Echo, Color Doppler, Cardiac Doppler and Intracardiac  Opacification Agent Indications:    NSTEMI  History:        Patient has no prior history of Echocardiogram examinations.                  Risk Factors:Hypertension and Dyslipidemia.  Sonographer:    Irving Burton Senior RDCS Referring Phys: [phone] RONDELL A Bascom  Sonographer Comments: Off axis windows due to thin body habitus and mastectomy IMPRESSIONS  1. There is akinesis of the distal 2/3 of the left ventricle with apical ballooning suggestive of possible Tako-Tsubo (stress-induced) cardiomyopathy. Left ventricular ejection fraction, by estimation, is 30 to 35%. The left ventricle has moderately decreased function. The left ventricle demonstrates regional wall motion abnormalities (see scoring diagram/findings for description). Left ventricular diastolic parameters are consistent with Grade II diastolic dysfunction (pseudonormalization).  2. Right ventricular systolic function is normal. The right ventricular size is normal. There is normal pulmonary artery systolic pressure.  3. Left atrial size was mild to moderately dilated.  4. The mitral valve is normal in structure. Mild mitral valve regurgitation. No evidence of mitral stenosis.  5. The aortic valve is tricuspid. There is mild calcification of the aortic valve. Aortic valve regurgitation is trivial. Aortic valve sclerosis/calcification is present, without any evidence of aortic stenosis.  6. The inferior vena cava is normal in size with greater than 50% respiratory variability, suggesting right atrial pressure of 3 mmHg. Conclusion(s)/Recommendation(s): There is akinesis of the distal 2/3 of the left ventricle with apical ballooning suggestive of possible Tako-Tsubo (stress-induced) cardiomyopathy. FINDINGS  Left Ventricle: There is akinesis of the distal 2/3 of the left ventricle with apical ballooning suggestive of possible Tako-Tsubo (stress-induced) cardiomyopathy. Left ventricular ejection fraction, by estimation, is 30 to 35%. The left ventricle has moderately decreased function. The left ventricle demonstrates regional wall motion abnormalities. Definity contrast agent was  given IV to delineate the left ventricular endocardial borders. The left ventricular internal cavity size was normal in size. There is no left ventricular hypertrophy. Left ventricular diastolic parameters are consistent with Grade II diastolic dysfunction (pseudonormalization).  LV Wall Scoring: The mid and distal anterior wall, mid and distal lateral wall, mid and distal anterior septum, mid and distal inferior wall, mid anterolateral segment, and mid inferoseptal segment are akinetic. Right Ventricle: The right ventricular size is normal. No increase in right ventricular wall thickness. Right ventricular systolic function is normal. There is normal pulmonary artery systolic pressure. The tricuspid regurgitant velocity is 2.73 m/s, and  with an assumed right atrial pressure of 3 mmHg, the estimated right ventricular systolic pressure is 32.8 mmHg. Left Atrium: Left atrial size was mild to moderately dilated. Right Atrium: Right atrial size was normal in size. Pericardium: There is no evidence of pericardial effusion. Mitral Valve: The mitral valve is normal in structure. Mild mitral valve regurgitation. No evidence of mitral valve stenosis. Tricuspid Valve: The tricuspid valve is normal in structure. Tricuspid valve regurgitation is mild . No evidence of tricuspid stenosis. Aortic Valve: The aortic valve is tricuspid. There is mild calcification of the aortic valve. Aortic valve regurgitation is trivial. Aortic valve sclerosis/calcification is present, without any evidence of aortic stenosis. Pulmonic Valve: The pulmonic valve was normal in structure. Pulmonic valve regurgitation is not visualized. No evidence of pulmonic stenosis. Aorta: The aortic root is normal in size and structure. Venous: The inferior vena cava is normal in size with greater than 50% respiratory variability, suggesting right atrial pressure of 3 mmHg. IAS/Shunts: No atrial level shunt detected by color flow Doppler.  LEFT VENTRICLE PLAX 2D  LVIDd:         3.90 cm     Diastology LVIDs:         3.20 cm     LV e' medial:    4.79 cm/s LV PW:         0.70 cm     LV E/e' medial:  18.2 LV IVS:        0.90 cm     LV e' lateral:   4.68 cm/s LVOT diam:     1.80 cm     LV E/e' lateral: 18.6 LV SV:         42 LV SV Index:   27 LVOT Area:     2.54 cm  LV Volumes (MOD) LV vol d, MOD A2C: 92.8 ml LV vol d, MOD A4C: 98.2 ml LV vol s, MOD A2C: 46.2 ml LV vol s, MOD A4C: 50.6 ml LV SV MOD A2C:     46.6 ml LV SV MOD A4C:     98.2 ml LV SV MOD BP:      48.9 ml RIGHT VENTRICLE RV S prime:     12.20 cm/s TAPSE (M-mode): 2.0 cm LEFT ATRIUM             Index        RIGHT ATRIUM           Index LA diam:        3.40 cm 2.21 cm/m   RA Area:     10.80 cm LA Vol (A2C):   34.8 ml 22.65 ml/m  RA Volume:   22.80 ml  14.84 ml/m LA Vol (A4C):   55.0 ml 35.81 ml/m LA Biplane Vol: 44.1 ml 28.71 ml/m  AORTIC VALVE LVOT Vmax:   75.70 cm/s LVOT Vmean:  59.700 cm/s LVOT VTI:    0.165 m  AORTA Ao Root diam: 2.60 cm MITRAL VALVE               TRICUSPID VALVE MV Area (PHT): 3.95 cm    TR Peak grad:   29.8 mmHg MV Decel Time: 192 msec    TR Vmax:        273.00 cm/s MV E velocity: 87.00 cm/s MV A velocity: 69.80 cm/s  SHUNTS MV E/A ratio:  1.25        Systemic VTI:  0.16 m                            Systemic Diam: 1.80 cm Arvilla Meres MD Electronically signed by Arvilla Meres MD Signature Date/Time: 10/17/2022/11:18:59 AM    Final    DG Chest Port 1 View  Result Date: 10/17/2022 CLINICAL DATA:  81 year old female with history of chest pain. EXAM: PORTABLE CHEST 1 VIEW COMPARISON:  Chest x-ray 05/11/2015. FINDINGS: Lung volumes are normal. Diffuse interstitial prominence and widespread peribronchial cuffing. No consolidative airspace disease. No pleural effusions. No pneumothorax. No evidence of pulmonary edema. Heart size is normal. Upper mediastinal contours are within normal limits. Surgical clips project over the left axillary region and left breast, likely from prior  lumpectomy and axillary lymph node dissection. IMPRESSION: 1. Diffuse interstitial prominence and widespread peribronchial cuffing, concerning for an acute bronchitis. Electronically Signed   By: Trudie Reed M.D.   On: 10/17/2022 08:20    Pending Labs Unresulted Labs (From admission, onward)     Start     Ordered   10/17/22 1500  Heparin  level (unfractionated)  Once-Timed,   URGENT        10/17/22 0547   10/17/22 0807  TSH  Once,   R        10/17/22 0806   10/17/22 0729  Lipid panel  Once,   R        10/17/22 0729   10/17/22 0051  Basic metabolic panel  Once,   STAT        10/17/22 0050   10/17/22 0051  Magnesium  Once,   STAT        10/17/22 0050            Vitals/Pain Today's Vitals   10/17/22 1215 10/17/22 1230 10/17/22 1245 10/17/22 1253  BP: (!) 154/88 (!) 155/82 (!) 152/88   Pulse: 93 97 (!) 102   Resp: (!) 27 20 (!) 21   Temp:    97.7 F (36.5 C)  TempSrc:    Temporal  SpO2: 96% 97% 99%   Weight:      Height:      PainSc:        Isolation Precautions No active isolations  Medications Medications  nitroGLYCERIN 50 mg in dextrose 5 % 250 mL (0.2 mg/mL) infusion (0 mcg/min Intravenous Stopped 10/17/22 0256)  heparin ADULT infusion 100 units/mL (25000 units/273mL) (650 Units/hr Intravenous New Bag/Given 10/17/22 0606)  acetaminophen (TYLENOL) tablet 650 mg (has no administration in time range)  ondansetron (ZOFRAN) injection 4 mg (has no administration in time range)  lisinopril (ZESTRIL) tablet 5 mg (5 mg Oral Given 10/17/22 1252)  atorvastatin (LIPITOR) tablet 10 mg (10 mg Oral Given 10/17/22 1252)  aspirin EC tablet 81 mg (81 mg Oral Given 10/17/22 1253)  0.9 %  sodium chloride infusion ( Intravenous New Bag/Given 10/17/22 1252)  hydrALAZINE (APRESOLINE) injection 10 mg (has no administration in time range)  perflutren lipid microspheres (DEFINITY) IV suspension (2 mLs Intravenous Given 10/17/22 1042)  heparin bolus via infusion 3,200 Units (3,200 Units  Intravenous Bolus from Bag 10/17/22 0606)  calcium gluconate 1 g/ 50 mL sodium chloride IVPB (0 mg Intravenous Stopped 10/17/22 0921)    Mobility walks     Focused Assessments Cardiac Assessment Handoff:    Lab Results  Component Value Date   CKTOTAL 72 10/07/2013   No results found for: "DDIMER" Does the Patient currently have chest pain? No    R Recommendations: See Admitting Provider Note  Report given to:   Additional Notes: axox4, independent to bathroom, RA

## 2022-10-17 NOTE — ED Provider Notes (Signed)
Shared  Paged cardiology at 0113 to discuss ECG. 2/2 network issues, I had to text him ecg's from EMS and here with evolving changes. Called back around 0126 and didn't think it was c/w ST elevation MI but maybe more from HTN. Will await troponins. NTG initiated for BP/pain control.   BP improved, pain improved, still some anxiety but otherwise no complaints. First trop reportedly normal (did not see the paper myself).   Second trop >2k, cp free. Will start heparin and consult cardiology.   CRITICAL CARE Performed by: Marily Memos Total critical care time: 35 minutes Critical care time was exclusive of separately billable procedures and treating other patients. Critical care was necessary to treat or prevent imminent or life-threatening deterioration. Critical care was time spent personally by me on the following activities: development of treatment plan with patient and/or surrogate as well as nursing, discussions with consultants, evaluation of patient's response to treatment, examination of patient, obtaining history from patient or surrogate, ordering and performing treatments and interventions, ordering and review of laboratory studies, ordering and review of radiographic studies, pulse oximetry and re-evaluation of patient's condition.    Marily Memos, MD 10/17/22 [id number]

## 2022-10-17 NOTE — Progress Notes (Signed)
Pt came back from cardiac cath. Asymptomatic . Bleeding happened right after removing 3 m of air from TR bandl. Had to put 3 ml back to TR band to stop bleeding.  Around 18:30 pm, she became clammy, sweaty and feeling sick. SBP drop down to 70's and 60's. Notified Rapid response and cardiology.  500 ml NS bolus ordered to infused. Pt BP went up to 101/47 when she completed 1/2 of bolus

## 2022-10-17 NOTE — H&P (Addendum)
History and Physical    Patient: Beth Simon MVH:[id number] DOB: [birth year] DOA: 10/17/2022 DOS: the patient was seen and examined on 10/17/2022 PCP: Merlene Laughter, MD (Inactive)  Patient coming from: Home  Chief Complaint:  Chief Complaint  Patient presents with   Hypertension   HPI: Beth Simon is a 82 y.o. female with medical history significant of HTN, HLD, breast cancer s/p mastectomy and chemotherapy, uterine cancer s/p hysterectomy with radiation who presents with complaints of palpitations starting yesterday evening. She describes it as having a strong heartbeat . She went and checked her blood pressure and noted the systolic was mildly elevated 145 and pulse was regular. She tried taking deep breath and rechecked it and it was 135. She had been packing in preparation for trip to see her son in [place] while watching the convention on TV. Denied having any chest pain, shortness of breath, diaphoresis, abdominal pain, nausea, or vomiting. She has no had any recent medication changes. About one month ago she had been seen by Dr. Everardo Pacific and had a kenalog shot in her knee, which caused her to have pins and needles sensation in her hands and legs the day after. Since that shot, she reports that she had felt fatigued and weak. Last night she states that it felt like her face was burning at one point in time, similar to when she's had steroid injections. Patient reports having chronic swelling in her left leg more than right to lymph node dissection from breast cancer. She does not usually take a daily aspirin, but had taken a full aspirin yesterday evening due to her symptoms. She is on low dose atorvastatin and lisinopril. She rechecked her blood pressure a little later and noted it elevated in the 200s for which she called EMS around 11pm. Her dad had angina in his 22's and mom had heart attack in her 51's,  but her mother didn't go to the doctor.  In the emergency department patient was  noted to be afebrile, pulse 71-103, resp elevated to 23, blood pressure elevated to 167/97, and O2 saturations maintained. Labs significant for HS trop 53->2054 and WBC 10.5. EKG noted ST wave changes in the anterolateral leads, but no prior to compare.  chest xray noted diffuse interstitial prominence and widespread peribronchial cuffing, concerning for an acute bronchitis.Cardiology was consulted. Patient was started on heparin drip.  Review of Systems: As mentioned in the history of present illness. All other systems reviewed and are negative. Past Medical History:  Diagnosis Date   Adenomatous polyps 01/2016   Anemia    Breast cancer (HCC) 1990   bilateral mastectomy, adenoca breast-left MRM, reconstruction, chemo   Bronchitis last 2 weeks   saw dr Pete Glatter 02-28-2014, he said no antibiotics needed, nonproductive cough   Complication of anesthesia    Cystitis    cytoxen, had once or twice   Family history of breast cancer    History of breast cancer    History of radiation therapy 2/10, 2/12, 2/18, 2/24, 05/29/14   vaginal cuff/ 30 Gy/ 5 fx   Hypertension    Macular degeneration    Neck pain    taking physictal therapy last 2 weeks   Osteoporosis    On Prolia.   PONV (postoperative nausea and vomiting)    Shoulder pain    taking physical therapy for last few weeks   Uterine cancer (HCC) 03/21/2014   MLH1/PMS2 LOH   Uterine fibroid    VAIN I (vaginal intraepithelial neoplasia  grade I) 2018   positive HR HPV.   Vasovagal syncopes    Past Surgical History:  Procedure Laterality Date   BREAST IMPLANT REMOVAL  6/94   left breast   BREAST RECONSTRUCTION  1990   left   CATARACT EXTRACTION Bilateral    EXCISION VAGINAL CYST     MASTECTOMY Right 10/08/06   prophylactic   RADICAL MASTECTOMY LND  1990   left, chemo done   ROBOTIC ASSISTED TOTAL HYSTERECTOMY WITH BILATERAL SALPINGO OOPHERECTOMY Bilateral 03/21/2014   Procedure: ROBOTIC ASSISTED TOTAL HYSTERECTOMY WITH BILATERAL  SALPINGO OOPHORECTOMY ;  Surgeon: Laurette Schimke, MD;  Location: [facility];  Service: Gynecology;  Laterality: Bilateral;   Social History:  reports that she has never smoked. She has never used smokeless tobacco. She reports that she does not drink alcohol and does not use drugs.  Allergies  Allergen Reactions   Celecoxib Hypertension   Kenalog [Triamcinolone]     Leg weakness/burning  injection   Prednisone Other (See Comments)    Unsteady gait per MD instructions  NO STEROIDS   Procaine Palpitations    Heart palpitations    Family History  Problem Relation Age of Onset   Breast cancer Sister 35       recurrence age 20   Breast cancer Paternal Grandmother    Heart disease Mother    Heart disease Father    Hypertension Sister    Dementia Sister    Breast cancer Maternal Aunt 20   Dementia Maternal Aunt        dx in her 75s    Prior to Admission medications   Medication Sig Start Date End Date Taking? Authorizing Provider  atorvastatin (LIPITOR) 10 MG tablet Take 10 mg by mouth daily. 12/13/18  Yes [provider]  Coenzyme Q10 (COQ10 PO) Take 1 tablet by mouth every morning.    Yes [provider]  Cyanocobalamin (VITAMIN B 12 PO) Take 1 tablet by mouth every morning.    Yes [provider]  denosumab (PROLIA) 60 MG/ML SOLN injection Inject 60 mg into the skin every 6 (six) months. Administer in upper arm, thigh, or abdomen   Yes [provider]  Flaxseed, Linseed, (FLAX SEEDS PO) Take 1 scoop by mouth every morning. Ground flaxseed in cereal.   Yes [provider]  lisinopril (PRINIVIL,ZESTRIL) 5 MG tablet Take 5 mg by mouth daily.   Yes [provider]  Multiple Vitamins-Minerals (PRESERVISION AREDS) CAPS Take 1 capsule by mouth daily.   Yes [provider]  OVER THE COUNTER MEDICATION Take 1 tablet by mouth every morning. Astazanthin. (anti-oxidant)   Yes [provider]  VITAMIN D, CHOLECALCIFEROL, PO  Take 4,000 Int'l Units by mouth every morning.    Yes [provider]    Physical Exam: Vitals:   10/17/22 0530 10/17/22 0547 10/17/22 0600 10/17/22 0615  BP: 126/79  (!) 144/74 (!) 145/79  Pulse: 81  100 82  Resp: (!) 23  17 20   Temp:  98.3 F (36.8 C)    TempSrc:  Oral    SpO2: 98%  98% 96%  Weight:      Height:       Constitutional:  Elderly female in NAD Eyes: PERRL, lids and conjunctivae normal ENMT: Mucous membranes are dry. Posterior pharynx clear of any exudate or lesions.  Neck: normal, supple, no JVD Respiratory: clear to auscultation bilaterally, no wheezing, no crackles. Normal respiratory effort.   Cardiovascular: Regular rate and rhythm with 2/6 murmur.  Trace lower extremity edema on the left leg.   Abdomen: no tenderness, no masses palpated. Bowel sounds positive.  Musculoskeletal: no clubbing / cyanosis. No joint deformity upper and lower extremities. Normal muscle tone.  Skin: no rashes, lesions, ulcers. No induration Neurologic: CN 2-12 grossly intact. Sensation intact, DTR normal. Strength 5/5 in all 4.  Psychiatric: Normal judgment and insight. Alert and oriented x 3. Normal mood.   Data Reviewed:  EKG revealed siReviewed labs, imaging, and pertinent records.  Assessment and Plan:  NSTEMI Palpatations Acute.Patient complaints of palpitations. EKG with ST-wave changes, but no prior to compare. HS trop 53->2054. -Admit to a telemetry bed -NPO -Continue heparin gtt per pharmacy -Check, lipid panel and hemoglobin A1c -Recheck EKG -Trend cardiac troponin -Check echocardiogram  -Continue ASA and statin -Cardiology consulted, will follow-up for further recommendations  Addendum: Systolic and diastolic heart failure Acute. Patient didn't appear to be overloaded. Echo revealed EF 30-35% with left apical ballooning and grade II diastolic dysfunction. This may have been the cause for patient's fatigue. -Cardiology consulted and plan on taking for  left and right heart cath   Essential hypertension  Initial blood pressure is elevated up to 167/87. Home blood pressure recommend includes lisinopril 10 mg daily. -Continue lisinopril -hydralazine IV prn elevated BP  Hyperlipidemia -Check lipid panel -Continue atorvastatin   Hyponatremia Acute. Sodium low at 132.  Unclear cause at this time. -Recheck sodium levels in a.m  Fatigue -Check TSH  History of breast and uterine cancer    DVT prophylaxis: Heparin Advance Care Planning:   Code Status: Full Code    Consults: Cardiology  Family Communication: none  Severity of Illness: The appropriate patient status for this patient is OBSERVATION. Observation status is judged to be reasonable and necessary in order to provide the required intensity of service to ensure the patient's safety. The patient's presenting symptoms, physical exam findings, and initial radiographic and laboratory data in the context of their medical condition is felt to place them at decreased risk for further clinical deterioration. Furthermore, it is anticipated that the patient will be medically stable for discharge from the hospital within 2 midnights of admission.   Author: Clydie Braun, MD 10/17/2022 7:56 AM  For on call review [email or web address].

## 2022-10-17 NOTE — Significant Event (Signed)
Rapid Response Event Note   Reason for Call :  Hypotension 70/49  Initial Focused Assessment:  Pt lying in bed, alert. Skin is warm, moist, pink. Lung sounds are clear. Heart rate is regular, no adventitious heart sounds. She denies chest pain, lightheadedness, or dizziness. She endorses feeling cold and weak.    VS: T 62F, BP 70/49, HR 69, RR 16, SpO2 95% on 2LNC  Interventions:  -500 cc IVF bolus -HOB flat  Plan of Care:  -Close monitoring of BP -Encourage oral intake  Call rapid response for additional needs  Event Summary:  MD Notified: Dr. Jenene Slicker Call Time: 1830 Arrival Time: 1835 End Time: 1905  Jennye Moccasin, RN

## 2022-10-17 NOTE — H&P (View-Only) (Signed)
Cardiology Consultation   Patient ID: Beth Simon MRN: [id number]; DOB: March 22, 1941  Admit date: 10/17/2022 Date of Consult: 10/17/2022  PCP:  Merlene Laughter, MD (Inactive)   [facility] Providers Cardiologist:  Peter Swaziland, MD   Patient Profile:   Beth Simon is a 82 y.o. female with a hx of HTN, HLD, breast cancer s/p mastectomy and chemotherapy, uterine cancer s/p hysterectomy with radiation who is being seen 10/17/2022 for the evaluation of chest pain at the request of Dr. Katrinka Blazing.  History of Present Illness:   [name] with no prior cardiac history presented with palpitations, hypertension, and chest discomfort 1/10. She was packing to visit her son in [place] and was watching the [facility] She does report fatigue and weakness since a kenalog shot in her knee one month ago. She has LE swelling L > R since lymph node dissection at the time of mastectomy. She was hypertensive on arrival with HS troponin 2024, EKG with RBBB - chronicity  unknown. She was admitted and cardiology asked to evaluate.   During my interview, she describes the knee injection 1 month ago. Since that time, she has intermittent leg weakness that radiates up her left side to her neck. She denies chest pain and shortness of breath, no progression of LE edema.   Of note, I confirmed with RN and lab that a HS troponin has  not been resulted. Unclear where the troponin value of ~2000 originated.    She does Gaffer. She also runs a walking group and they walk 2 miles weekly. No hx of exertional chest pain.   Her husband died 9 years ago. She worked as a Gaffer. She is a never smoker, no DM.   Past Medical History:  Diagnosis Date   Adenomatous polyps 01/2016   Anemia    Breast cancer (HCC) 1990   bilateral mastectomy, adenoca breast-left MRM, reconstruction, chemo   Bronchitis last 2 weeks   saw dr Pete Glatter 02-28-2014, he said no antibiotics needed, nonproductive  cough   Complication of anesthesia    Cystitis    cytoxen, had once or twice   Family history of breast cancer    History of breast cancer    History of radiation therapy 2/10, 2/12, 2/18, 2/24, 05/29/14   vaginal cuff/ 30 Gy/ 5 fx   Hypertension    Macular degeneration    Neck pain    taking physictal therapy last 2 weeks   Osteoporosis    On Prolia.   PONV (postoperative nausea and vomiting)    Shoulder pain    taking physical therapy for last few weeks   Uterine cancer (HCC) 03/21/2014   MLH1/PMS2 LOH   Uterine fibroid    VAIN I (vaginal intraepithelial neoplasia grade I) 2018   positive HR HPV.   Vasovagal syncopes     Past Surgical History:  Procedure Laterality Date   BREAST IMPLANT REMOVAL  6/94   left breast   BREAST RECONSTRUCTION  1990   left   CATARACT EXTRACTION Bilateral    EXCISION VAGINAL CYST     MASTECTOMY Right 10/08/06   prophylactic   RADICAL MASTECTOMY LND  1990   left, chemo done   ROBOTIC ASSISTED TOTAL HYSTERECTOMY WITH BILATERAL SALPINGO OOPHERECTOMY Bilateral 03/21/2014   Procedure: ROBOTIC ASSISTED TOTAL HYSTERECTOMY WITH BILATERAL SALPINGO OOPHORECTOMY ;  Surgeon: Laurette Schimke, MD;  Location: [facility];  Service: Gynecology;  Laterality: Bilateral;     Home Medications:  Prior  to Admission medications   Medication Sig Start Date End Date Taking? Authorizing Provider  atorvastatin (LIPITOR) 10 MG tablet Take 10 mg by mouth daily. 12/13/18  Yes [provider]  Coenzyme Q10 (COQ10 PO) Take 1 tablet by mouth every morning.    Yes [provider]  Cyanocobalamin (VITAMIN B 12 PO) Take 1 tablet by mouth every morning.    Yes [provider]  denosumab (PROLIA) 60 MG/ML SOLN injection Inject 60 mg into the skin every 6 (six) months. Administer in upper arm, thigh, or abdomen   Yes [provider]  Flaxseed, Linseed, (FLAX SEEDS PO) Take 1 scoop by mouth every morning. Ground flaxseed in cereal.   Yes [provider]  lisinopril (PRINIVIL,ZESTRIL) 5 MG tablet Take 5 mg by mouth daily.   Yes [provider]  Multiple Vitamins-Minerals (PRESERVISION AREDS) CAPS Take 1 capsule by mouth daily.   Yes [provider]  OVER THE COUNTER MEDICATION Take 1 tablet by mouth every morning. Astazanthin. (anti-oxidant)   Yes [provider]  VITAMIN D, CHOLECALCIFEROL, PO Take 4,000 Int'l Units by mouth every morning.    Yes [provider]    Inpatient Medications: Scheduled Meds:  aspirin EC  81 mg Oral Daily   atorvastatin  10 mg Oral Daily   lisinopril  5 mg Oral Daily   Continuous Infusions:  sodium chloride     heparin 650 Units/hr (10/17/22 0606)   nitroGLYCERIN Stopped (10/17/22 0256)   PRN Meds: acetaminophen, hydrALAZINE, ondansetron (ZOFRAN) IV, perflutren lipid microspheres (DEFINITY) IV suspension  Allergies:    Allergies  Allergen Reactions   Celecoxib Hypertension   Kenalog [Triamcinolone]     Leg weakness/burning  injection   Prednisone Other (See Comments)    Unsteady gait per MD instructions  NO STEROIDS   Procaine Palpitations    Heart palpitations    Social History:   Social History   Socioeconomic History   Marital status: Widowed    Spouse name: Not on file   Number of children: 1   Years of education: Not on file   Highest education level: Not on file  Occupational History   Not on file  Tobacco Use   Smoking status: Never   Smokeless tobacco: Never  Vaping Use   Vaping status: Never Used  Substance and Sexual Activity   Alcohol use: No   Drug use: No   Sexual activity: Not Currently    Partners: Male    Comment: 1st intercourse 64 yo-2 partners  Other Topics Concern   Not on file  Social History Narrative   Not on file   Social Determinants of Health   Financial Resource Strain: Not on file  Food Insecurity: Not on file  Transportation Needs: Not on file  Physical Activity: Not on file  Stress: Not on  file  Social Connections: Not on file  Intimate Partner Violence: Not on file    Family History:    Family History  Problem Relation Age of Onset   Breast cancer Sister 84       recurrence age 74   Breast cancer Paternal Grandmother    Heart disease Mother    Heart disease Father    Hypertension Sister    Dementia Sister    Breast cancer Maternal Aunt 85   Dementia Maternal Aunt        dx in her 7s     ROS:  Please see the history of present illness.  All other ROS reviewed and negative.     Physical Exam/Data:   Vitals:   10/17/22 0845 10/17/22 0900 10/17/22 0915 10/17/22 0921  BP: (!) 151/90 (!) 152/91 (!) 144/83   Pulse: 86 84 75   Resp: 17 13 16    Temp:    97.7 F (36.5 C)  TempSrc:    Oral  SpO2: 97% 97% 97%   Weight:      Height:       No intake or output data in the 24 hours ending 10/17/22 1150    10/17/2022   12:49 AM 09/04/2021    1:31 PM 08/28/2020    1:42 PM  Last 3 Weights  Weight (lbs) 134 lb 134 lb 135 lb  Weight (kg) 60.782 kg 60.782 kg 61.236 kg     Body mass index is 28.01 kg/m.  General:  Well nourished, well developed, in no acute distress HEENT: normal Neck: no JVD Vascular: No carotid bruits; Distal pulses 2+ bilaterally Cardiac:  normal S1, S2; RRR; no murmur  Lungs:  clear to auscultation bilaterally, no wheezing, rhonchi or rales  Abd: soft, nontender, no hepatomegaly  Ext: no edema Musculoskeletal:  No deformities, BUE and BLE strength normal and equal Skin: warm and dry  Neuro:  CNs 2-12 intact, no focal abnormalities noted Psych:  Normal affect   EKG:  The EKG was personally reviewed and demonstrates:  sinus rhythm with SR HR 98, RBBB Telemetry:  Telemetry was personally reviewed and demonstrates:  sinus rhythm with HR 70s-90s  Relevant CV Studies:  Echo 10/16/22:  1. There is akinesis of the distal 2/3 of the left ventricle with apical  ballooning suggestive of possible Tako-Tsubo (stress-induced)  cardiomyopathy. Left  ventricular ejection fraction, by estimation, is 30  to 35%. The left ventricle has moderately  decreased function. The left ventricle demonstrates regional wall motion  abnormalities (see scoring diagram/findings for description). Left  ventricular diastolic parameters are consistent with Grade II diastolic  dysfunction (pseudonormalization).   2. Right ventricular systolic function is normal. The right ventricular  size is normal. There is normal pulmonary artery systolic pressure.   3. Left atrial size was mild to moderately dilated.   4. The mitral valve is normal in structure. Mild mitral valve  regurgitation. No evidence of mitral stenosis.   5. The aortic valve is tricuspid. There is mild calcification of the  aortic valve. Aortic valve regurgitation is trivial. Aortic valve  sclerosis/calcification is present, without any evidence of aortic  stenosis.   6. The inferior vena cava is normal in size with greater than 50%  respiratory variability, suggesting right atrial pressure of 3 mmHg.   Laboratory Data:  High Sensitivity Troponin:  No results for input(s): "TROPONINIHS" in the last 720 hours.   Chemistry Recent Labs  Lab 10/17/22 0054  NA 132*  K 3.5  CL 99  GLUCOSE 110*  BUN 12  CREATININE 0.80    No results for input(s): "PROT", "ALBUMIN", "AST", "ALT", "ALKPHOS", "BILITOT" in the last 168 hours. Lipids No results for input(s): "CHOL", "TRIG", "HDL", "LABVLDL", "LDLCALC", "CHOLHDL" in the last 168 hours.  Hematology Recent Labs  Lab 10/17/22 0049 10/17/22 0054  WBC 10.5  --   RBC 4.29  --   HGB 13.0 13.3  HCT 39.1 39.0  MCV 91.1  --   MCH 30.3  --   MCHC 33.2  --   RDW 13.2  --   PLT 242  --    Thyroid No results for  input(s): "TSH", "FREET4" in the last 168 hours.  BNPNo results for input(s): "BNP", "PROBNP" in the last 168 hours.  DDimer No results for input(s): "DDIMER" in the last 168 hours.   Radiology/Studies:  ECHOCARDIOGRAM COMPLETE  Result  Date: 10/17/2022    ECHOCARDIOGRAM REPORT   Patient Name:   Beth Simon Date of Exam: 10/17/2022 Medical Rec #:  [id number]       Height:       58.0 in Accession #:    [id number]      Weight:       134.0 lb Date of Birth:  Mar 17, 1941       BSA:          1.536 m Patient Age:    82 years        BP:           140/74 mmHg Patient Gender: F               HR:           78 bpm. Exam Location:  Inpatient Procedure: 2D Echo, Color Doppler, Cardiac Doppler and Intracardiac            Opacification Agent Indications:    NSTEMI  History:        Patient has no prior history of Echocardiogram examinations.                 Risk Factors:Hypertension and Dyslipidemia.  Sonographer:    Irving Burton Senior RDCS Referring Phys: [phone] RONDELL A Mundo  Sonographer Comments: Off axis windows due to thin body habitus and mastectomy IMPRESSIONS  1. There is akinesis of the distal 2/3 of the left ventricle with apical ballooning suggestive of possible Tako-Tsubo (stress-induced) cardiomyopathy. Left ventricular ejection fraction, by estimation, is 30 to 35%. The left ventricle has moderately decreased function. The left ventricle demonstrates regional wall motion abnormalities (see scoring diagram/findings for description). Left ventricular diastolic parameters are consistent with Grade II diastolic dysfunction (pseudonormalization).  2. Right ventricular systolic function is normal. The right ventricular size is normal. There is normal pulmonary artery systolic pressure.  3. Left atrial size was mild to moderately dilated.  4. The mitral valve is normal in structure. Mild mitral valve regurgitation. No evidence of mitral stenosis.  5. The aortic valve is tricuspid. There is mild calcification of the aortic valve. Aortic valve regurgitation is trivial. Aortic valve sclerosis/calcification is present, without any evidence of aortic stenosis.  6. The inferior vena cava is normal in size with greater than 50% respiratory variability, suggesting  right atrial pressure of 3 mmHg. Conclusion(s)/Recommendation(s): There is akinesis of the distal 2/3 of the left ventricle with apical ballooning suggestive of possible Tako-Tsubo (stress-induced) cardiomyopathy. FINDINGS  Left Ventricle: There is akinesis of the distal 2/3 of the left ventricle with apical ballooning suggestive of possible Tako-Tsubo (stress-induced) cardiomyopathy. Left ventricular ejection fraction, by estimation, is 30 to 35%. The left ventricle has moderately decreased function. The left ventricle demonstrates regional wall motion abnormalities. Definity contrast agent was given IV to delineate the left ventricular endocardial borders. The left ventricular internal cavity size was normal in size. There is no left ventricular hypertrophy. Left ventricular diastolic parameters are consistent with Grade II diastolic dysfunction (pseudonormalization).  LV Wall Scoring: The mid and distal anterior wall, mid and distal lateral wall, mid and distal anterior septum, mid and distal inferior wall, mid anterolateral segment, and mid inferoseptal segment are akinetic. Right Ventricle: The right ventricular size is normal. No increase  in right ventricular wall thickness. Right ventricular systolic function is normal. There is normal pulmonary artery systolic pressure. The tricuspid regurgitant velocity is 2.73 m/s, and  with an assumed right atrial pressure of 3 mmHg, the estimated right ventricular systolic pressure is 32.8 mmHg. Left Atrium: Left atrial size was mild to moderately dilated. Right Atrium: Right atrial size was normal in size. Pericardium: There is no evidence of pericardial effusion. Mitral Valve: The mitral valve is normal in structure. Mild mitral valve regurgitation. No evidence of mitral valve stenosis. Tricuspid Valve: The tricuspid valve is normal in structure. Tricuspid valve regurgitation is mild . No evidence of tricuspid stenosis. Aortic Valve: The aortic valve is tricuspid.  There is mild calcification of the aortic valve. Aortic valve regurgitation is trivial. Aortic valve sclerosis/calcification is present, without any evidence of aortic stenosis. Pulmonic Valve: The pulmonic valve was normal in structure. Pulmonic valve regurgitation is not visualized. No evidence of pulmonic stenosis. Aorta: The aortic root is normal in size and structure. Venous: The inferior vena cava is normal in size with greater than 50% respiratory variability, suggesting right atrial pressure of 3 mmHg. IAS/Shunts: No atrial level shunt detected by color flow Doppler.  LEFT VENTRICLE PLAX 2D LVIDd:         3.90 cm     Diastology LVIDs:         3.20 cm     LV e' medial:    4.79 cm/s LV PW:         0.70 cm     LV E/e' medial:  18.2 LV IVS:        0.90 cm     LV e' lateral:   4.68 cm/s LVOT diam:     1.80 cm     LV E/e' lateral: 18.6 LV SV:         42 LV SV Index:   27 LVOT Area:     2.54 cm  LV Volumes (MOD) LV vol d, MOD A2C: 92.8 ml LV vol d, MOD A4C: 98.2 ml LV vol s, MOD A2C: 46.2 ml LV vol s, MOD A4C: 50.6 ml LV SV MOD A2C:     46.6 ml LV SV MOD A4C:     98.2 ml LV SV MOD BP:      48.9 ml RIGHT VENTRICLE RV S prime:     12.20 cm/s TAPSE (M-mode): 2.0 cm LEFT ATRIUM             Index        RIGHT ATRIUM           Index LA diam:        3.40 cm 2.21 cm/m   RA Area:     10.80 cm LA Vol (A2C):   34.8 ml 22.65 ml/m  RA Volume:   22.80 ml  14.84 ml/m LA Vol (A4C):   55.0 ml 35.81 ml/m LA Biplane Vol: 44.1 ml 28.71 ml/m  AORTIC VALVE LVOT Vmax:   75.70 cm/s LVOT Vmean:  59.700 cm/s LVOT VTI:    0.165 m  AORTA Ao Root diam: 2.60 cm MITRAL VALVE               TRICUSPID VALVE MV Area (PHT): 3.95 cm    TR Peak grad:   29.8 mmHg MV Decel Time: 192 msec    TR Vmax:        273.00 cm/s MV E velocity: 87.00 cm/s MV A velocity: 69.80 cm/s  SHUNTS MV E/A ratio:  1.25        Systemic VTI:  0.16 m                            Systemic Diam: 1.80 cm Arvilla Meres MD Electronically signed by Arvilla Meres MD  Signature Date/Time: 10/17/2022/11:18:59 AM    Final    DG Chest Port 1 View  Result Date: 10/17/2022 CLINICAL DATA:  82 year old female with history of chest pain. EXAM: PORTABLE CHEST 1 VIEW COMPARISON:  Chest x-ray 05/11/2015. FINDINGS: Lung volumes are normal. Diffuse interstitial prominence and widespread peribronchial cuffing. No consolidative airspace disease. No pleural effusions. No pneumothorax. No evidence of pulmonary edema. Heart size is normal. Upper mediastinal contours are within normal limits. Surgical clips project over the left axillary region and left breast, likely from prior lumpectomy and axillary lymph node dissection. IMPRESSION: 1. Diffuse interstitial prominence and widespread peribronchial cuffing, concerning for an acute bronchitis. Electronically Signed   By: Trudie Reed M.D.   On: 10/17/2022 08:20     Assessment and Plan:   Chest pain - reported 1/10 chest pain last evening while packing watching TV, although during my interview she describes heart pounding more than frank chest pain - echocardiogram with akinesis of the distal 2/3 of hte LV with apical ballooning suggestive of possible takotsubo cardiomyopathy - LVEF 30-35% - HS troponin reportedly elevated to 2000, via direct verbal communication, although I do not see this result from lab - will plan for definitive angiography with  left and right heart catheterization   Systolic and diastolic heart failure - LVEF 08-65%, possibly due to takotsubo cardiomyopathy - grade 2 DD - will need definitive L and R heart catheterization - does not appear volume up   Palpitations - telemetry with sinus rhythm, no further palpiations   Hypertension - PTA: 5 mg lisinopril - will likely need titration of HTN agents - mildly hypertensive here, will hold lisinopril with suspected cath   Hyperlipidemia with LDL goal < 70 Lipid panel pending Home dose 10 mg lipitor     Risk Assessment/Risk Scores:      TIMI Risk Score for Unstable Angina or Non-ST Elevation MI:   The patient's TIMI risk score is 3, which indicates a 13% risk of all cause mortality, new or recurrent myocardial infarction or need for urgent revascularization in the next 14 days.          For questions or updates, please contact  [facility] Please consult [email or web address] for contact info under    Signed, Marcelino Duster, PA  10/17/2022 11:50 AM

## 2022-10-17 NOTE — Plan of Care (Signed)
On call cardiology note  Nurse notified cardiology on-call about patient feeling sick, cold, clammy with diffuse diaphoresis, BP 62/43 mm Hg. 500 cc IVF bolus was ordered by primary team and being administered by the time I went to see and examine the patient.  Subjective: Feeling better compared to prior  Vitals: BP 88/56 mm Hg with MAP 62 and currently 100/52 mm Hg with 500 cc bolus in progress, HR 72 bpm NSR Exam: In Trendelenburg position, HEENT normal, S1 S2 heard, no S3, no loud murmur, clear lungs, abd NT and ND, no edema and AAO x 3.  Assessment # Hypotension likely secondary to dehydration -Patient had been NPO since 10/16/22 night. Did not eat or drink anything since last night and even after procedure this afternoon. She received lisinopril 5 mg this afternoon. Vitals BP 88/56 mm Hg with MAP 62 and currently 100/52 mm Hg with 500 cc bolus in progress. Physical exam unremarkable. Agree with 500 ml bolus despite low LVEF. Hold anti-hypertensives and restart only if BP is more than the goal. Goal BP<140-150 mmHg SBP.  Beth Spurlock Verne Spurr, MD [place]  [facility]  7:20 PM

## 2022-10-17 NOTE — Progress Notes (Signed)
Echocardiogram 2D Echocardiogram has been performed.  Beth Simon Beth Simon RDCS 10/17/2022, 10:43 AM

## 2022-10-17 NOTE — Progress Notes (Signed)
I personally reviewed cath films with Dr Herbie Baltimore. She has a left dominant system. She does have complex CAD  There is a long complex stenosis in the proximal to mid LAD moderate- severe with calcification. There is also diffuse moderate to severe disease in the mid LAD. The left PDA is small with a focal 90% stenosis. There is also disease in small dominant RCA  Based on her presentation I think it is still quite likely that this is a stress induced cardiomyopathy. Echo is more c/w this and Ecg has no significant ST changes. She really doesn't have significant angina  I think it is appropriate to manage her medically with goal of optimizing GDMT for her CHF. Would repeat Echo in 3 months. I would not pursue revascularization unless she develops refractory angina on medical therapy. Her PCI options are very limited and not favorable.   Beth Maiorana Swaziland MD, [facility]

## 2022-10-17 NOTE — Progress Notes (Signed)
ANTICOAGULATION CONSULT NOTE - Initial Consult  Pharmacy Consult for heparin Indication: chest pain/ACS  Allergies  Allergen Reactions   Celecoxib Hypertension   Kenalog [Triamcinolone]     Leg weakness/burning  injection   Prednisone Other (See Comments)    Unsteady gait per MD instructions  NO STEROIDS   Procaine Palpitations    Heart palpitations    Patient Measurements: Height: 4\' 10"  (147.3 cm) Weight: 60.8 kg (134 lb) IBW/kg (Calculated) : 40.9 Heparin Dosing Weight: 54 kg  Vital Signs: Temp: 98.6 F (37 C) (07/19 0049) Temp Source: Oral (07/19 0049) BP: 131/76 (07/19 0515) Pulse Rate: 71 (07/19 0515)  Labs: Recent Labs    10/17/22 0049 10/17/22 0054  HGB 13.0 13.3  HCT 39.1 39.0  PLT 242  --   CREATININE  --  0.80    Estimated Creatinine Clearance: 41.1 mL/min (by C-G formula based on SCr of 0.8 mg/dL).   Medical History: Past Medical History:  Diagnosis Date   Adenomatous polyps 01/2016   Anemia    Breast cancer (HCC) 1990   bilateral mastectomy, adenoca breast-left MRM, reconstruction, chemo   Bronchitis last 2 weeks   saw dr Pete Glatter 02-28-2014, he said no antibiotics needed, nonproductive cough   Complication of anesthesia    Cystitis    cytoxen, had once or twice   Family history of breast cancer    History of breast cancer    History of radiation therapy 2/10, 2/12, 2/18, 2/24, 05/29/14   vaginal cuff/ 30 Gy/ 5 fx   Hypertension    Macular degeneration    Neck pain    taking physictal therapy last 2 weeks   Osteoporosis    On Prolia.   PONV (postoperative nausea and vomiting)    Shoulder pain    taking physical therapy for last few weeks   Uterine cancer (HCC) 03/21/2014   MLH1/PMS2 LOH   Uterine fibroid    VAIN I (vaginal intraepithelial neoplasia grade I) 2018   positive HR HPV.   Vasovagal syncopes    Assessment: 62 yoF presented with high blood pressure with headache and slight chest pain ( a 1 on scale from 1-10).  Pharmacy consulted to dose heparin for ACS. CBC stable.  Goal of Therapy:  Heparin level 0.3-0.7 units/ml Monitor platelets by anticoagulation protocol: Yes   Plan:  Give 3200 units bolus x 1 Start heparin infusion at 650 units/hr Check anti-Xa level in 8 hours and daily while on heparin Continue to monitor H&H and platelets  Beth Simon, PharmD. Clinical Pharmacist 10/17/2022 5:46 AM

## 2022-10-17 NOTE — ED Notes (Signed)
Assisted patient with ambulating to the bathroom.

## 2022-10-17 NOTE — Progress Notes (Signed)
ANTICOAGULATION CONSULT NOTE   Pharmacy Consult for heparin Indication: multivessel CAD  Allergies  Allergen Reactions   Celecoxib Hypertension   Kenalog [Triamcinolone]     Leg weakness/burning  injection   Prednisone Other (See Comments)    Unsteady gait per MD instructions  NO STEROIDS   Procaine Palpitations    Heart palpitations    Patient Measurements: Height: 4' 10.5" (148.6 cm) Weight: 59 kg (130 lb 1.1 oz) IBW/kg (Calculated) : 42.05 Heparin Dosing Weight: 54 kg  Vital Signs: Temp: 98.5 F (36.9 C) (07/19 1935) Temp Source: Oral (07/19 1935) BP: 118/53 (07/19 1935) Pulse Rate: 77 (07/19 1935)  Labs: Recent Labs    10/17/22 0049 10/17/22 0054 10/17/22 0353 10/17/22 1616 10/17/22 1623  HGB 13.0 13.3  --  14.3 13.6  14.6  HCT 39.1 39.0  --  42.0 40.0  43.0  PLT 242  --   --   --   --   CREATININE 0.82 0.80  --   --   --   TROPONINIHS 53*  --  2,054*  --   --     Estimated Creatinine Clearance: 41.1 mL/min (by C-G formula based on SCr of 0.8 mg/dL).  Assessment: 72 yoF presented with high blood pressure with headache and slight chest pain ( a 1 on scale from 1-10). Pharmacy consulted to dose heparin for ACS. CBC stable.  Pt s/p cath 7/19 which showed diffuse diease - plan for optimizing medical therapy. Reinitiate IV heparin 2 hours after TR band removal and run for total of 48 hours. TR band will be off by midnight per RN.   Heparin originally started 7/19 0600 so will add stop time of 7/21  0600 (48 hr total)  Goal of Therapy:  Heparin level 0.3-0.7 units/ml Monitor platelets by anticoagulation protocol: Yes   Plan:  At 0200, restart heparin 650 units/hr - will end 7/21 0600 F/u 8hr heparin level post restart  Christoper Fabian, PharmD, BCPS Please see amion for complete clinical pharmacist phone list 10/17/2022 11:15 PM

## 2022-10-17 NOTE — ED Triage Notes (Signed)
Pt called ems for high blood pressure. Said that she was packing tonight when her heart felt like it was racing, she took her bp and it was elevated. Said she did have a headache and her chest hurt slightly at a 1 on the pain scale.  Pt had 324 of baby aspirin and had 1 nitroglycerin prior to arrival.

## 2022-10-17 NOTE — Consult Note (Signed)
Cardiology Consultation   Patient ID: Beth Simon MRN: [id number]; DOB: March 22, 1941  Admit date: 10/17/2022 Date of Consult: 10/17/2022  PCP:  Merlene Laughter, MD (Inactive)   [facility] Providers Cardiologist:  Peter Swaziland, MD   Patient Profile:   Beth Simon is a 82 y.o. female with a hx of HTN, HLD, breast cancer s/p mastectomy and chemotherapy, uterine cancer s/p hysterectomy with radiation who is being seen 10/17/2022 for the evaluation of chest pain at the request of Dr. Katrinka Blazing.  History of Present Illness:   Beth Simon with no prior cardiac history presented with palpitations, hypertension, and chest discomfort 1/10. She was packing to visit her son in [place] and was watching the [facility] She does report fatigue and weakness since a kenalog shot in her knee one month ago. She has LE swelling L > R since lymph node dissection at the time of mastectomy. She was hypertensive on arrival with HS troponin 2024, EKG with RBBB - chronicity  unknown. She was admitted and cardiology asked to evaluate.   During my interview, she describes the knee injection 1 month ago. Since that time, she has intermittent leg weakness that radiates up her left side to her neck. She denies chest pain and shortness of breath, no progression of LE edema.   Of note, I confirmed with RN and lab that a HS troponin has  not been resulted. Unclear where the troponin value of ~2000 originated.    She does Gaffer. She also runs a walking group and they walk 2 miles weekly. No hx of exertional chest pain.   Her husband died 9 years ago. She worked as a Gaffer. She is a never smoker, no DM.   Past Medical History:  Diagnosis Date   Adenomatous polyps 01/2016   Anemia    Breast cancer (HCC) 1990   bilateral mastectomy, adenoca breast-left MRM, reconstruction, chemo   Bronchitis last 2 weeks   saw dr Pete Glatter 02-28-2014, he said no antibiotics needed, nonproductive  cough   Complication of anesthesia    Cystitis    cytoxen, had once or twice   Family history of breast cancer    History of breast cancer    History of radiation therapy 2/10, 2/12, 2/18, 2/24, 05/29/14   vaginal cuff/ 30 Gy/ 5 fx   Hypertension    Macular degeneration    Neck pain    taking physictal therapy last 2 weeks   Osteoporosis    On Prolia.   PONV (postoperative nausea and vomiting)    Shoulder pain    taking physical therapy for last few weeks   Uterine cancer (HCC) 03/21/2014   MLH1/PMS2 LOH   Uterine fibroid    VAIN I (vaginal intraepithelial neoplasia grade I) 2018   positive HR HPV.   Vasovagal syncopes     Past Surgical History:  Procedure Laterality Date   BREAST IMPLANT REMOVAL  6/94   left breast   BREAST RECONSTRUCTION  1990   left   CATARACT EXTRACTION Bilateral    EXCISION VAGINAL CYST     MASTECTOMY Right 10/08/06   prophylactic   RADICAL MASTECTOMY LND  1990   left, chemo done   ROBOTIC ASSISTED TOTAL HYSTERECTOMY WITH BILATERAL SALPINGO OOPHERECTOMY Bilateral 03/21/2014   Procedure: ROBOTIC ASSISTED TOTAL HYSTERECTOMY WITH BILATERAL SALPINGO OOPHORECTOMY ;  Surgeon: Laurette Schimke, MD;  Location: [facility];  Service: Gynecology;  Laterality: Bilateral;     Home Medications:  Prior  to Admission medications   Medication Sig Start Date End Date Taking? Authorizing Provider  atorvastatin (LIPITOR) 10 MG tablet Take 10 mg by mouth daily. 12/13/18  Yes [provider]  Coenzyme Q10 (COQ10 PO) Take 1 tablet by mouth every morning.    Yes [provider]  Cyanocobalamin (VITAMIN B 12 PO) Take 1 tablet by mouth every morning.    Yes [provider]  denosumab (PROLIA) 60 MG/ML SOLN injection Inject 60 mg into the skin every 6 (six) months. Administer in upper arm, thigh, or abdomen   Yes [provider]  Flaxseed, Linseed, (FLAX SEEDS PO) Take 1 scoop by mouth every morning. Ground flaxseed in cereal.   Yes [provider]  lisinopril (PRINIVIL,ZESTRIL) 5 MG tablet Take 5 mg by mouth daily.   Yes [provider]  Multiple Vitamins-Minerals (PRESERVISION AREDS) CAPS Take 1 capsule by mouth daily.   Yes [provider]  OVER THE COUNTER MEDICATION Take 1 tablet by mouth every morning. Astazanthin. (anti-oxidant)   Yes [provider]  VITAMIN D, CHOLECALCIFEROL, PO Take 4,000 Int'l Units by mouth every morning.    Yes [provider]    Inpatient Medications: Scheduled Meds:  aspirin EC  81 mg Oral Daily   atorvastatin  10 mg Oral Daily   lisinopril  5 mg Oral Daily   Continuous Infusions:  sodium chloride     heparin 650 Units/hr (10/17/22 0606)   nitroGLYCERIN Stopped (10/17/22 0256)   PRN Meds: acetaminophen, hydrALAZINE, ondansetron (ZOFRAN) IV, perflutren lipid microspheres (DEFINITY) IV suspension  Allergies:    Allergies  Allergen Reactions   Celecoxib Hypertension   Kenalog [Triamcinolone]     Leg weakness/burning  injection   Prednisone Other (See Comments)    Unsteady gait per MD instructions  NO STEROIDS   Procaine Palpitations    Heart palpitations    Social History:   Social History   Socioeconomic History   Marital status: Widowed    Spouse name: Not on file   Number of children: 1   Years of education: Not on file   Highest education level: Not on file  Occupational History   Not on file  Tobacco Use   Smoking status: Never   Smokeless tobacco: Never  Vaping Use   Vaping status: Never Used  Substance and Sexual Activity   Alcohol use: No   Drug use: No   Sexual activity: Not Currently    Partners: Male    Comment: 1st intercourse 64 yo-2 partners  Other Topics Concern   Not on file  Social History Narrative   Not on file   Social Determinants of Health   Financial Resource Strain: Not on file  Food Insecurity: Not on file  Transportation Needs: Not on file  Physical Activity: Not on file  Stress: Not on  file  Social Connections: Not on file  Intimate Partner Violence: Not on file    Family History:    Family History  Problem Relation Age of Onset   Breast cancer Sister 84       recurrence age 74   Breast cancer Paternal Grandmother    Heart disease Mother    Heart disease Father    Hypertension Sister    Dementia Sister    Breast cancer Maternal Aunt 85   Dementia Maternal Aunt        dx in her 7s     ROS:  Please see the history of present illness.  All other ROS reviewed and negative.     Physical Exam/Data:   Vitals:   10/17/22 0845 10/17/22 0900 10/17/22 0915 10/17/22 0921  BP: (!) 151/90 (!) 152/91 (!) 144/83   Pulse: 86 84 75   Resp: 17 13 16    Temp:    97.7 F (36.5 C)  TempSrc:    Oral  SpO2: 97% 97% 97%   Weight:      Height:       No intake or output data in the 24 hours ending 10/17/22 1150    10/17/2022   12:49 AM 09/04/2021    1:31 PM 08/28/2020    1:42 PM  Last 3 Weights  Weight (lbs) 134 lb 134 lb 135 lb  Weight (kg) 60.782 kg 60.782 kg 61.236 kg     Body mass index is 28.01 kg/m.  General:  Well nourished, well developed, in no acute distress HEENT: normal Neck: no JVD Vascular: No carotid bruits; Distal pulses 2+ bilaterally Cardiac:  normal S1, S2; RRR; no murmur  Lungs:  clear to auscultation bilaterally, no wheezing, rhonchi or rales  Abd: soft, nontender, no hepatomegaly  Ext: no edema Musculoskeletal:  No deformities, BUE and BLE strength normal and equal Skin: warm and dry  Neuro:  CNs 2-12 intact, no focal abnormalities noted Psych:  Normal affect   EKG:  The EKG was personally reviewed and demonstrates:  sinus rhythm with SR HR 98, RBBB Telemetry:  Telemetry was personally reviewed and demonstrates:  sinus rhythm with HR 70s-90s  Relevant CV Studies:  Echo 10/16/22:  1. There is akinesis of the distal 2/3 of the left ventricle with apical  ballooning suggestive of possible Tako-Tsubo (stress-induced)  cardiomyopathy. Left  ventricular ejection fraction, by estimation, is 30  to 35%. The left ventricle has moderately  decreased function. The left ventricle demonstrates regional wall motion  abnormalities (see scoring diagram/findings for description). Left  ventricular diastolic parameters are consistent with Grade II diastolic  dysfunction (pseudonormalization).   2. Right ventricular systolic function is normal. The right ventricular  size is normal. There is normal pulmonary artery systolic pressure.   3. Left atrial size was mild to moderately dilated.   4. The mitral valve is normal in structure. Mild mitral valve  regurgitation. No evidence of mitral stenosis.   5. The aortic valve is tricuspid. There is mild calcification of the  aortic valve. Aortic valve regurgitation is trivial. Aortic valve  sclerosis/calcification is present, without any evidence of aortic  stenosis.   6. The inferior vena cava is normal in size with greater than 50%  respiratory variability, suggesting right atrial pressure of 3 mmHg.   Laboratory Data:  High Sensitivity Troponin:  No results for input(s): "TROPONINIHS" in the last 720 hours.   Chemistry Recent Labs  Lab 10/17/22 0054  NA 132*  K 3.5  CL 99  GLUCOSE 110*  BUN 12  CREATININE 0.80    No results for input(s): "PROT", "ALBUMIN", "AST", "ALT", "ALKPHOS", "BILITOT" in the last 168 hours. Lipids No results for input(s): "CHOL", "TRIG", "HDL", "LABVLDL", "LDLCALC", "CHOLHDL" in the last 168 hours.  Hematology Recent Labs  Lab 10/17/22 0049 10/17/22 0054  WBC 10.5  --   RBC 4.29  --   HGB 13.0 13.3  HCT 39.1 39.0  MCV 91.1  --   MCH 30.3  --   MCHC 33.2  --   RDW 13.2  --   PLT 242  --    Thyroid No results for  input(s): "TSH", "FREET4" in the last 168 hours.  BNPNo results for input(s): "BNP", "PROBNP" in the last 168 hours.  DDimer No results for input(s): "DDIMER" in the last 168 hours.   Radiology/Studies:  ECHOCARDIOGRAM COMPLETE  Result  Date: 10/17/2022    ECHOCARDIOGRAM REPORT   Patient Name:   Beth Simon Date of Exam: 10/17/2022 Medical Rec #:  [id number]       Height:       58.0 in Accession #:    [id number]      Weight:       134.0 lb Date of Birth:  Mar 17, 1941       BSA:          1.536 m Patient Age:    82 years        BP:           140/74 mmHg Patient Gender: F               HR:           78 bpm. Exam Location:  Inpatient Procedure: 2D Echo, Color Doppler, Cardiac Doppler and Intracardiac            Opacification Agent Indications:    NSTEMI  History:        Patient has no prior history of Echocardiogram examinations.                 Risk Factors:Hypertension and Dyslipidemia.  Sonographer:    Irving Burton Senior RDCS Referring Phys: [phone] RONDELL A Mundo  Sonographer Comments: Off axis windows due to thin body habitus and mastectomy IMPRESSIONS  1. There is akinesis of the distal 2/3 of the left ventricle with apical ballooning suggestive of possible Tako-Tsubo (stress-induced) cardiomyopathy. Left ventricular ejection fraction, by estimation, is 30 to 35%. The left ventricle has moderately decreased function. The left ventricle demonstrates regional wall motion abnormalities (see scoring diagram/findings for description). Left ventricular diastolic parameters are consistent with Grade II diastolic dysfunction (pseudonormalization).  2. Right ventricular systolic function is normal. The right ventricular size is normal. There is normal pulmonary artery systolic pressure.  3. Left atrial size was mild to moderately dilated.  4. The mitral valve is normal in structure. Mild mitral valve regurgitation. No evidence of mitral stenosis.  5. The aortic valve is tricuspid. There is mild calcification of the aortic valve. Aortic valve regurgitation is trivial. Aortic valve sclerosis/calcification is present, without any evidence of aortic stenosis.  6. The inferior vena cava is normal in size with greater than 50% respiratory variability, suggesting  right atrial pressure of 3 mmHg. Conclusion(s)/Recommendation(s): There is akinesis of the distal 2/3 of the left ventricle with apical ballooning suggestive of possible Tako-Tsubo (stress-induced) cardiomyopathy. FINDINGS  Left Ventricle: There is akinesis of the distal 2/3 of the left ventricle with apical ballooning suggestive of possible Tako-Tsubo (stress-induced) cardiomyopathy. Left ventricular ejection fraction, by estimation, is 30 to 35%. The left ventricle has moderately decreased function. The left ventricle demonstrates regional wall motion abnormalities. Definity contrast agent was given IV to delineate the left ventricular endocardial borders. The left ventricular internal cavity size was normal in size. There is no left ventricular hypertrophy. Left ventricular diastolic parameters are consistent with Grade II diastolic dysfunction (pseudonormalization).  LV Wall Scoring: The mid and distal anterior wall, mid and distal lateral wall, mid and distal anterior septum, mid and distal inferior wall, mid anterolateral segment, and mid inferoseptal segment are akinetic. Right Ventricle: The right ventricular size is normal. No increase  in right ventricular wall thickness. Right ventricular systolic function is normal. There is normal pulmonary artery systolic pressure. The tricuspid regurgitant velocity is 2.73 m/s, and  with an assumed right atrial pressure of 3 mmHg, the estimated right ventricular systolic pressure is 32.8 mmHg. Left Atrium: Left atrial size was mild to moderately dilated. Right Atrium: Right atrial size was normal in size. Pericardium: There is no evidence of pericardial effusion. Mitral Valve: The mitral valve is normal in structure. Mild mitral valve regurgitation. No evidence of mitral valve stenosis. Tricuspid Valve: The tricuspid valve is normal in structure. Tricuspid valve regurgitation is mild . No evidence of tricuspid stenosis. Aortic Valve: The aortic valve is tricuspid.  There is mild calcification of the aortic valve. Aortic valve regurgitation is trivial. Aortic valve sclerosis/calcification is present, without any evidence of aortic stenosis. Pulmonic Valve: The pulmonic valve was normal in structure. Pulmonic valve regurgitation is not visualized. No evidence of pulmonic stenosis. Aorta: The aortic root is normal in size and structure. Venous: The inferior vena cava is normal in size with greater than 50% respiratory variability, suggesting right atrial pressure of 3 mmHg. IAS/Shunts: No atrial level shunt detected by color flow Doppler.  LEFT VENTRICLE PLAX 2D LVIDd:         3.90 cm     Diastology LVIDs:         3.20 cm     LV e' medial:    4.79 cm/s LV PW:         0.70 cm     LV E/e' medial:  18.2 LV IVS:        0.90 cm     LV e' lateral:   4.68 cm/s LVOT diam:     1.80 cm     LV E/e' lateral: 18.6 LV SV:         42 LV SV Index:   27 LVOT Area:     2.54 cm  LV Volumes (MOD) LV vol d, MOD A2C: 92.8 ml LV vol d, MOD A4C: 98.2 ml LV vol s, MOD A2C: 46.2 ml LV vol s, MOD A4C: 50.6 ml LV SV MOD A2C:     46.6 ml LV SV MOD A4C:     98.2 ml LV SV MOD BP:      48.9 ml RIGHT VENTRICLE RV S prime:     12.20 cm/s TAPSE (M-mode): 2.0 cm LEFT ATRIUM             Index        RIGHT ATRIUM           Index LA diam:        3.40 cm 2.21 cm/m   RA Area:     10.80 cm LA Vol (A2C):   34.8 ml 22.65 ml/m  RA Volume:   22.80 ml  14.84 ml/m LA Vol (A4C):   55.0 ml 35.81 ml/m LA Biplane Vol: 44.1 ml 28.71 ml/m  AORTIC VALVE LVOT Vmax:   75.70 cm/s LVOT Vmean:  59.700 cm/s LVOT VTI:    0.165 m  AORTA Ao Root diam: 2.60 cm MITRAL VALVE               TRICUSPID VALVE MV Area (PHT): 3.95 cm    TR Peak grad:   29.8 mmHg MV Decel Time: 192 msec    TR Vmax:        273.00 cm/s MV E velocity: 87.00 cm/s MV A velocity: 69.80 cm/s  SHUNTS MV E/A ratio:  1.25        Systemic VTI:  0.16 m                            Systemic Diam: 1.80 cm Arvilla Meres MD Electronically signed by Arvilla Meres MD  Signature Date/Time: 10/17/2022/11:18:59 AM    Final    DG Chest Port 1 View  Result Date: 10/17/2022 CLINICAL DATA:  82 year old female with history of chest pain. EXAM: PORTABLE CHEST 1 VIEW COMPARISON:  Chest x-ray 05/11/2015. FINDINGS: Lung volumes are normal. Diffuse interstitial prominence and widespread peribronchial cuffing. No consolidative airspace disease. No pleural effusions. No pneumothorax. No evidence of pulmonary edema. Heart size is normal. Upper mediastinal contours are within normal limits. Surgical clips project over the left axillary region and left breast, likely from prior lumpectomy and axillary lymph node dissection. IMPRESSION: 1. Diffuse interstitial prominence and widespread peribronchial cuffing, concerning for an acute bronchitis. Electronically Signed   By: Trudie Reed M.D.   On: 10/17/2022 08:20     Assessment and Plan:   Chest pain - reported 1/10 chest pain last evening while packing watching TV, although during my interview she describes heart pounding more than frank chest pain - echocardiogram with akinesis of the distal 2/3 of hte LV with apical ballooning suggestive of possible takotsubo cardiomyopathy - LVEF 30-35% - HS troponin reportedly elevated to 2000, via direct verbal communication, although I do not see this result from lab - will plan for definitive angiography with  left and right heart catheterization   Systolic and diastolic heart failure - LVEF 08-65%, possibly due to takotsubo cardiomyopathy - grade 2 DD - will need definitive L and R heart catheterization - does not appear volume up   Palpitations - telemetry with sinus rhythm, no further palpiations   Hypertension - PTA: 5 mg lisinopril - will likely need titration of HTN agents - mildly hypertensive here, will hold lisinopril with suspected cath   Hyperlipidemia with LDL goal < 70 Lipid panel pending Home dose 10 mg lipitor     Risk Assessment/Risk Scores:      TIMI Risk Score for Unstable Angina or Non-ST Elevation MI:   The patient's TIMI risk score is 3, which indicates a 13% risk of all cause mortality, new or recurrent myocardial infarction or need for urgent revascularization in the next 14 days.          For questions or updates, please contact  [facility] Please consult [email or web address] for contact info under    Signed, Marcelino Duster, PA  10/17/2022 11:50 AM

## 2022-10-17 NOTE — Interval H&P Note (Signed)
History and Physical Interval Note:  10/17/2022 3:40 PM  Beth Simon  has presented today for surgery, with the diagnosis of cardiomyopathy.  The various methods of treatment have been discussed with the patient and family. After consideration of risks, benefits and other options for treatment, the patient has consented to  Procedure(s): RIGHT/LEFT HEART CATH AND CORONARY ANGIOGRAPHY (N/A)  PERCUTANEOUS CORONARY INTERVENTION  as a surgical intervention.  The patient's history has been reviewed, patient examined, no change in status, stable for surgery.  I have reviewed the patient's chart and labs.  Questions were answered to the patient's satisfaction.    Cath Lab Visit (complete for each Cath Lab visit)  Clinical Evaluation Leading to the Procedure:   ACS: No.  Non-ACS:    Anginal Classification: No Symptoms - NEW CHF  Anti-ischemic medical therapy: Minimal Therapy (1 class of medications)  Non-Invasive Test Results: Intermediate-risk stress test findings: cardiac mortality 1-3%/year - EF 30-35%  Prior CABG: No previous CABG   Bryan Lemma

## 2022-10-17 NOTE — ED Provider Notes (Signed)
[facility] Emergency Department Provider Note MRN:  [id number]  Arrival date & time: 10/17/22     Chief Complaint   Hypertension   History of Present Illness   Beth Simon is a 82 y.o. year-old female presents to the ED with chief complaint of chest pain, palpitations and elevated BP.  States that she noticed the symptoms after packing for a trip tonight.  BP continued to increase, so she came to the ER for evaluation.  She states that her symptoms have improved, but she still has some intermittent chest discomfort and palpitations.  She took 325 of ASA prior to arrival.  History provided by patient.   Review of Systems  Pertinent positive and negative review of systems noted in HPI.    Physical Exam   Vitals:   10/17/22 0530 10/17/22 0547  BP: 126/79   Pulse: 81   Resp: (!) 23   Temp:  98.3 F (36.8 C)  SpO2: 98%     CONSTITUTIONAL:  anxious-appearing, NAD NEURO:  Alert and oriented x 3, CN 3-12 grossly intact EYES:  eyes equal and reactive ENT/NECK:  Supple, no stridor  CARDIO:  normal rate, regular rhythm, appears well-perfused  PULM:  No respiratory distress,  GI/GU:  non-distended,  MSK/SPINE:  No gross deformities, no edema, moves all extremities  SKIN:  no rash, atraumatic   *Additional and/or pertinent findings included in MDM below  Diagnostic and Interventional Summary    EKG Interpretation Date/Time:    Ventricular Rate:    PR Interval:    QRS Duration:    QT Interval:    QTC Calculation:   R Axis:      Text Interpretation:         Labs Reviewed  I-STAT CHEM 8, ED - Abnormal; Notable for the following components:      Result Value   Sodium 132 (*)    Glucose, Bld 110 (*)    Calcium, Ion 1.10 (*)    All other components within normal limits  CBC  BASIC METABOLIC PANEL  MAGNESIUM  HEPARIN LEVEL (UNFRACTIONATED)  TROPONIN I (HIGH SENSITIVITY)  TROPONIN I (HIGH SENSITIVITY)    DG Chest Port 1 View     (Results Pending)    Medications  nitroGLYCERIN 50 mg in dextrose 5 % 250 mL (0.2 mg/mL) infusion (0 mcg/min Intravenous Stopped 10/17/22 0256)  heparin ADULT infusion 100 units/mL (25000 units/297mL) (650 Units/hr Intravenous New Bag/Given 10/17/22 0606)  heparin bolus via infusion 3,200 Units (3,200 Units Intravenous Bolus from Bag 10/17/22 0606)     Procedures  /  Critical Care .Critical Care  Performed by: Roxy Horseman, PA-C Authorized by: Roxy Horseman, PA-C   Critical care provider statement:    Critical care time (minutes):  45   Critical care was necessary to treat or prevent imminent or life-threatening deterioration of the following conditions:  Circulatory failure   Critical care was time spent personally by me on the following activities:  Development of treatment plan with patient or surrogate, discussions with consultants, evaluation of patient's response to treatment, examination of patient, ordering and review of laboratory studies, ordering and review of radiographic studies, ordering and performing treatments and interventions, pulse oximetry, re-evaluation of patient's condition and review of old charts   ED Course and Medical Decision Making  I have reviewed the triage vital signs, the nursing notes, and pertinent available records from the EMR.  Social Determinants Affecting Complexity of Care: Patient has no clinically significant social determinants  affecting this chief complaint..   ED Course: Clinical Course as of 10/17/22 0612  Fri Oct 17, 2022  1610 I was notified by RN that troponin is 2024.  Consult placed to cardiology. [RB]    Clinical Course User Index [RB] Roxy Horseman, PA-C    Medical Decision Making Patient here with palpitations, elevated BP and chest pain.  Will check labs.    EKG initially concerning for new RBBB and possibly hyper-acute T-waves.  Dr. Clayborne Dana spoke with cardiology about this.  See his note for details.  Amount  and/or Complexity of Data Reviewed Labs: ordered.    Details: Trop 2024, will start heparin Radiology: ordered. ECG/medicine tests: independent interpretation performed.    Details: RBBB, no priors for comparison.  Risk Prescription drug management.         Consultants: I consulted with Dr. Mackie Pai, who recommends heparin and medicine admission. I consulted with Dr. Julian Reil, who will have day team see patient.   Treatment and Plan: Patient's exam and diagnostic results are concerning for NSTEMI.  Feel that patient will need admission to the hospital for further treatment and evaluation.  Patient seen by and discussed with attending physician, Dr. Clayborne Dana, who agrees with plan.  Final Clinical Impressions(s) / ED Diagnoses     ICD-10-CM   1. NSTEMI (non-ST elevated myocardial infarction) [facility])  I21.4       ED Discharge Orders     None         Discharge Instructions Discussed with and Provided to Patient:   Discharge Instructions   None      Roxy Horseman, PA-C 10/17/22 0612    Mesner, Hidaya Cower, MD 10/17/22 2300

## 2022-10-18 DIAGNOSIS — Z820 Family history of epilepsy and other diseases of the nervous system: Secondary | ICD-10-CM | POA: Diagnosis not present

## 2022-10-18 DIAGNOSIS — Z888 Allergy status to other drugs, medicaments and biological substances status: Secondary | ICD-10-CM | POA: Diagnosis not present

## 2022-10-18 DIAGNOSIS — M810 Age-related osteoporosis without current pathological fracture: Secondary | ICD-10-CM | POA: Diagnosis not present

## 2022-10-18 DIAGNOSIS — I10 Essential (primary) hypertension: Secondary | ICD-10-CM | POA: Diagnosis not present

## 2022-10-18 DIAGNOSIS — I2510 Atherosclerotic heart disease of native coronary artery without angina pectoris: Secondary | ICD-10-CM | POA: Diagnosis not present

## 2022-10-18 DIAGNOSIS — E7800 Pure hypercholesterolemia, unspecified: Secondary | ICD-10-CM

## 2022-10-18 DIAGNOSIS — Z9013 Acquired absence of bilateral breasts and nipples: Secondary | ICD-10-CM | POA: Diagnosis not present

## 2022-10-18 DIAGNOSIS — I519 Heart disease, unspecified: Secondary | ICD-10-CM

## 2022-10-18 DIAGNOSIS — Z79899 Other long term (current) drug therapy: Secondary | ICD-10-CM | POA: Diagnosis not present

## 2022-10-18 DIAGNOSIS — E871 Hypo-osmolality and hyponatremia: Secondary | ICD-10-CM | POA: Diagnosis not present

## 2022-10-18 DIAGNOSIS — Z923 Personal history of irradiation: Secondary | ICD-10-CM | POA: Diagnosis not present

## 2022-10-18 DIAGNOSIS — I5181 Takotsubo syndrome: Secondary | ICD-10-CM | POA: Diagnosis not present

## 2022-10-18 DIAGNOSIS — I110 Hypertensive heart disease with heart failure: Secondary | ICD-10-CM | POA: Diagnosis not present

## 2022-10-18 DIAGNOSIS — Z853 Personal history of malignant neoplasm of breast: Secondary | ICD-10-CM | POA: Diagnosis not present

## 2022-10-18 DIAGNOSIS — Z90710 Acquired absence of both cervix and uterus: Secondary | ICD-10-CM | POA: Diagnosis not present

## 2022-10-18 DIAGNOSIS — I5041 Acute combined systolic (congestive) and diastolic (congestive) heart failure: Secondary | ICD-10-CM | POA: Diagnosis not present

## 2022-10-18 DIAGNOSIS — I214 Non-ST elevation (NSTEMI) myocardial infarction: Secondary | ICD-10-CM | POA: Diagnosis not present

## 2022-10-18 DIAGNOSIS — R710 Precipitous drop in hematocrit: Secondary | ICD-10-CM | POA: Diagnosis not present

## 2022-10-18 DIAGNOSIS — Z8249 Family history of ischemic heart disease and other diseases of the circulatory system: Secondary | ICD-10-CM | POA: Diagnosis not present

## 2022-10-18 DIAGNOSIS — H3530 Unspecified macular degeneration: Secondary | ICD-10-CM | POA: Diagnosis not present

## 2022-10-18 DIAGNOSIS — Z8542 Personal history of malignant neoplasm of other parts of uterus: Secondary | ICD-10-CM | POA: Diagnosis not present

## 2022-10-18 DIAGNOSIS — Z803 Family history of malignant neoplasm of breast: Secondary | ICD-10-CM | POA: Diagnosis not present

## 2022-10-18 DIAGNOSIS — Z9221 Personal history of antineoplastic chemotherapy: Secondary | ICD-10-CM | POA: Diagnosis not present

## 2022-10-18 DIAGNOSIS — R002 Palpitations: Secondary | ICD-10-CM | POA: Diagnosis not present

## 2022-10-18 DIAGNOSIS — Z886 Allergy status to analgesic agent status: Secondary | ICD-10-CM | POA: Diagnosis not present

## 2022-10-18 LAB — CBC WITH DIFFERENTIAL/PLATELET
Abs Immature Granulocytes: 0.04 10*3/uL (ref 0.00–0.07)
Basophils Absolute: 0.1 10*3/uL (ref 0.0–0.1)
Basophils Relative: 0 %
Eosinophils Absolute: 0.2 10*3/uL (ref 0.0–0.5)
Eosinophils Relative: 2 %
HCT: 38.7 % (ref 36.0–46.0)
Hemoglobin: 13.1 g/dL (ref 12.0–15.0)
Immature Granulocytes: 0 %
Lymphocytes Relative: 23 %
Lymphs Abs: 2.7 10*3/uL (ref 0.7–4.0)
MCH: 30 pg (ref 26.0–34.0)
MCHC: 33.9 g/dL (ref 30.0–36.0)
MCV: 88.8 fL (ref 80.0–100.0)
Monocytes Absolute: 1.2 10*3/uL — ABNORMAL HIGH (ref 0.1–1.0)
Monocytes Relative: 10 %
Neutro Abs: 7.5 10*3/uL (ref 1.7–7.7)
Neutrophils Relative %: 65 %
Platelets: 239 10*3/uL (ref 150–400)
RBC: 4.36 MIL/uL (ref 3.87–5.11)
RDW: 13.1 % (ref 11.5–15.5)
WBC: 11.6 10*3/uL — ABNORMAL HIGH (ref 4.0–10.5)
nRBC: 0 % (ref 0.0–0.2)

## 2022-10-18 LAB — BASIC METABOLIC PANEL
Anion gap: 9 (ref 5–15)
BUN: 8 mg/dL (ref 8–23)
CO2: 19 mmol/L — ABNORMAL LOW (ref 22–32)
Calcium: 8 mg/dL — ABNORMAL LOW (ref 8.9–10.3)
Chloride: 105 mmol/L (ref 98–111)
Creatinine, Ser: 0.77 mg/dL (ref 0.44–1.00)
GFR, Estimated: >60 mL/min (ref >60)
Glucose, Bld: 103 mg/dL — ABNORMAL HIGH (ref 70–99)
Potassium: 4.1 mmol/L (ref 3.5–5.1)
Sodium: 133 mmol/L — ABNORMAL LOW (ref 135–145)

## 2022-10-18 LAB — HEPARIN LEVEL (UNFRACTIONATED)
Heparin Unfractionated: <0.1 IU/mL — ABNORMAL LOW (ref 0.30–0.70)
Heparin Unfractionated: <0.1 IU/mL — ABNORMAL LOW (ref 0.30–0.70)

## 2022-10-18 MED ORDER — LACTATED RINGERS IV BOLUS
250.0000 mL | Freq: Once | INTRAVENOUS | Status: AC
  Administered 2022-10-18: 250 mL via INTRAVENOUS

## 2022-10-18 MED ORDER — ATORVASTATIN CALCIUM 40 MG PO TABS
40.0000 mg | ORAL_TABLET | Freq: Every day | ORAL | Status: DC
  Administered 2022-10-18 – 2022-10-19 (×2): 40 mg via ORAL
  Filled 2022-10-18 (×2): qty 1

## 2022-10-18 MED ORDER — CARVEDILOL 3.125 MG PO TABS
3.1250 mg | ORAL_TABLET | Freq: Two times a day (BID) | ORAL | Status: DC
  Administered 2022-10-18 – 2022-10-19 (×2): 3.125 mg via ORAL
  Filled 2022-10-18 (×2): qty 1

## 2022-10-18 MED ORDER — LOSARTAN POTASSIUM 25 MG PO TABS
12.5000 mg | ORAL_TABLET | Freq: Every day | ORAL | Status: DC
  Administered 2022-10-18 – 2022-10-19 (×2): 12.5 mg via ORAL
  Filled 2022-10-18 (×2): qty 1

## 2022-10-18 NOTE — Progress Notes (Signed)
PROGRESS NOTE  Beth Simon:[id number] DOB: Mar 18, 1941 DOA: 10/17/2022 PCP: Merlene Laughter, MD (Inactive)   HPI/Recap of past 24 hours: Beth Simon is a 82 y.o. female with medical history significant of HTN, HLD, breast cancer s/p mastectomy and chemotherapy, uterine cancer s/p hysterectomy with radiation who presents with complaints of palpitations and uncontrolled BP X 1 day. In the ED, labs showed HS trop 53->2054, EKG noted no acute ST wave changes. Cardiology was consulted. Patient was started on heparin drip.  Patient admitted for further management.    Today, patient denies any worsening shortness breath or chest pain.  Yesterday evening, after angiogram, pt became hypotensive, responded to IVF. BP currently stable.   Assessment/Plan: Principal Problem:   NSTEMI (non-ST elevated myocardial infarction) (HCC) Active Problems:   Palpitations   Acute combined systolic and diastolic congestive heart failure (HCC)   Essential hypertension   Hyperlipidemia   Hyponatremia   Fatigue   History of uterine cancer   History of breast cancer   NSTEMI Vs Takotsubo syndrome HS trop 53->2054->52,000 EKG noted no acute ST wave changes Echo revealed EF 30-35% with left apical ballooning and grade II diastolic dysfunction LHC on 7/19 showed complex CAD, including moderate to severe disease of the mid LAD.  No revascularization done as not favorable, with limited option Cardiology on board, medical management Continue IV heparin for total of 72 hours, long-term aspirin plus clopidogrel, Coreg, losartan, Lipitor Monitor closely  Acute systolic and diastolic heart failure Echo revealed EF 30-35% with left apical ballooning and grade II diastolic dysfunction Continue coreg, losartan  Essential hypertension  Continue losartan, coreg Hydralazine IV prn  Hyperlipidemia LDL 111 Continue atorvastatin    Hyponatremia Daily BMP   History of breast and uterine cancer     Estimated body mass index is 29.08 kg/m as calculated from the following:   Height as of this encounter: 4' 10.5" (1.486 m).   Weight as of this encounter: 64.2 kg.     Code Status: Full  Family Communication: None at bedside  Disposition Plan: Status is: Observation The patient will require care spanning > 2 midnights and should be moved to inpatient because: level of care      Consultants: Cardiology  Procedures: LHC on 7/19  Antimicrobials: None   DVT prophylaxis: Heparin drip   Objective: Vitals:   10/17/22 1935 10/18/22 0404 10/18/22 0741 10/18/22 1131  BP: (!) 118/53 (!) 103/54 (!) 107/57 123/63  Pulse: 77 71 74 77  Resp: 16 16 18 18   Temp: 98.5 F (36.9 C) (!) 97.4 F (36.3 C) 98.8 F (37.1 C) 98.6 F (37 C)  TempSrc: Oral Oral Oral Oral  SpO2: (!) 86% 96% 97% 97%  Weight:  64.2 kg    Height:        Intake/Output Summary (Last 24 hours) at 10/18/2022 1449 Last data filed at 10/18/2022 1303 Gross per 24 hour  Intake 698.23 ml  Output --  Net 698.23 ml   Filed Weights   10/17/22 0049 10/17/22 1441 10/18/22 0404  Weight: 60.8 kg 59 kg 64.2 kg    Exam: General: NAD  Cardiovascular: S1, S2 present Respiratory: CTAB Abdomen: Soft, nontender, nondistended, bowel sounds present Musculoskeletal: No bilateral pedal edema noted Skin: Normal Psychiatry: Normal mood   Data Reviewed: CBC: Recent Labs  Lab 10/17/22 0049 10/17/22 0054 10/17/22 1616 10/17/22 1623 10/18/22 0814  WBC 10.5  --   --   --  11.6*  NEUTROABS  --   --   --   --  7.5  HGB 13.0 13.3 14.3 13.6  14.6 13.1  HCT 39.1 39.0 42.0 40.0  43.0 38.7  MCV 91.1  --   --   --  88.8  PLT 242  --   --   --  239   Basic Metabolic Panel: Recent Labs  Lab 10/17/22 0049 10/17/22 0054 10/17/22 1616 10/17/22 1623 10/18/22 0814  NA 133* 132* 134* 135  135 133*  K 3.6 3.5 4.0 3.8  3.9 4.1  CL 95* 99  --   --  105  CO2 20*  --   --   --  19*  GLUCOSE 110* 110*  --   --  103*   BUN 12 12  --   --  8  CREATININE 0.82 0.80  --   --  0.77  CALCIUM 9.5  --   --   --  8.0*  MG 2.1  --   --   --   --    GFR: Estimated Creatinine Clearance: 42.8 mL/min (by C-G formula based on SCr of 0.77 mg/dL). Liver Function Tests: No results for input(s): "AST", "ALT", "ALKPHOS", "BILITOT", "PROT", "ALBUMIN" in the last 168 hours. No results for input(s): "LIPASE", "AMYLASE" in the last 168 hours. No results for input(s): "AMMONIA" in the last 168 hours. Coagulation Profile: No results for input(s): "INR", "PROTIME" in the last 168 hours. Cardiac Enzymes: No results for input(s): "CKTOTAL", "CKMB", "CKMBINDEX", "TROPONINI" in the last 168 hours. BNP (last 3 results) No results for input(s): "PROBNP" in the last 8760 hours. HbA1C: Recent Labs    10/17/22 1216  HGBA1C 5.7*   CBG: No results for input(s): "GLUCAP" in the last 168 hours. Lipid Profile: Recent Labs    10/17/22 1216  CHOL 205*  HDL 78  LDLCALC 111*  TRIG 79  CHOLHDL 2.6   Thyroid Function Tests: Recent Labs    10/17/22 1216  TSH 2.258   Anemia Panel: No results for input(s): "VITAMINB12", "FOLATE", "FERRITIN", "TIBC", "IRON", "RETICCTPCT" in the last 72 hours. Urine analysis:    Component Value Date/Time   COLORURINE YELLOW 03/01/2014 1032   APPEARANCEUR CLEAR 03/01/2014 1032   LABSPEC 1.008 03/01/2014 1032   PHURINE 7.0 03/01/2014 1032   GLUCOSEU NEGATIVE 03/01/2014 1032   HGBUR NEGATIVE 03/01/2014 1032   BILIRUBINUR neg 05/16/2016 1500   KETONESUR NEGATIVE 03/01/2014 1032   PROTEINUR neg 05/16/2016 1500   PROTEINUR NEGATIVE 03/01/2014 1032   UROBILINOGEN negative 05/16/2016 1500   UROBILINOGEN 0.2 03/01/2014 1032   NITRITE neg 05/16/2016 1500   NITRITE NEGATIVE 03/01/2014 1032   LEUKOCYTESUR small (1+) (A) 05/16/2016 1500   Sepsis Labs: @LABRCNTIP (procalcitonin:4,lacticidven:4)  )No results found for this or any previous visit (from the past 240 hour(s)).    Studies: CARDIAC  CATHETERIZATION  Result Date: 10/17/2022   Prox LAD to Mid LAD lesion is 45% stenosed at major 1st Diag followed by Mid LAD-1 lesion is 70% stenosed- just after 1st Sept (with ~ berry aneurysmal segment.  Not favorable for PCI.   Mid LAD-2 lesion is 70% stenosed.  Mid LAD-3 lesion is 85% stenosed. With difuse moderate disease throughout.   1st Diag lesion is 60% stenosed.   LPDA lesion is 90% stenosed.   LV end diastolic pressure is normal.   There is no aortic valve stenosis. POST-CATH DIAGNOSES Severe diffuse LAD disease and focal left PDA disease of the left dominant system and separate ostia of LAD and dominant LCx. LAD has multiple sequential lesions ranging from 40% to  85-90% with 1 area involving branch points of major diagonal branch and septal perforator not very favorable for PCI especially given extent of calcification. Focal PDA 90% stenosis just prior to a very tortuous area.  (PCI amenable, but question of usefulness with the extent of disease in LAD). Well compensated CHF with completely normal Right Heart Cath and LV Pressures, but notably reduced Cardiac Output and Index (3.86, 2.51) PAP-mean 21/11-16 million mercury, PCWP 6 mm, LV P-EDP 128/2-11 mmHg with a AO P-MAP 124/60-86 mmHg. Ao sat 94%, PA sat 64%. RECOMMENDATIONS In the absence of any other complications or medical issues, we expect the patient to be ready for discharge from a cath perspective. Would optimize medical therapy for CAD and cardiomyopathy.  Appears to be well compensated. If she has worsening dyspnea on exertion or anginal equivalent type symptoms, could consider reevaluating PCI options, but for now would plan to treat medically.  Stabilized medically prior to discharge. Reinitiate IV heparin 2 hours after TR band removal and run for total of 48 hours. Based on likely ACS presentation and existing disease, would recommend at least 3 to 6 months of DAPT followed by completion of 1 year Plavix monotherapy. Will DC lisinopril  with plans to potentially convert to ARB plus or minus Entresto. Bryan Lemma, MD   Scheduled Meds:  aspirin EC  81 mg Oral Daily   atorvastatin  40 mg Oral Daily   carvedilol  3.125 mg Oral BID WC   clopidogrel  300 mg Oral Once   clopidogrel  75 mg Oral Q breakfast   losartan  12.5 mg Oral Daily   sodium chloride flush  3 mL Intravenous Q12H    Continuous Infusions:  sodium chloride     heparin 800 Units/hr (10/18/22 1154)   sodium chloride       LOS: 0 days     Briant Cedar, MD [facility]  If 7PM-7AM, please contact night-coverage [email or web address] 10/18/2022, 2:49 PM

## 2022-10-18 NOTE — Plan of Care (Signed)
Pt ambulate to bathroom 

## 2022-10-18 NOTE — Progress Notes (Signed)
ANTICOAGULATION CONSULT NOTE- Follow Up  Pharmacy Consult for heparin Indication: multivessel CAD  Allergies  Allergen Reactions   Celecoxib Hypertension   Kenalog [Triamcinolone]     Leg weakness/burning  injection   Prednisone Other (See Comments)    Unsteady gait per MD instructions  NO STEROIDS   Procaine Palpitations    Heart palpitations    Patient Measurements: Height: 4' 10.5" (148.6 cm) Weight: 64.2 kg (141 lb 8.6 oz) IBW/kg (Calculated) : 42.05 Heparin Dosing Weight: 54 kg  Vital Signs: Temp: 98.8 F (37.1 C) (07/20 0741) Temp Source: Oral (07/20 0741) BP: 107/57 (07/20 0741) Pulse Rate: 74 (07/20 0741)  Labs: Recent Labs    10/17/22 0049 10/17/22 0054 10/17/22 0353 10/17/22 1616 10/17/22 1623  HGB 13.0 13.3  --  14.3 13.6  14.6  HCT 39.1 39.0  --  42.0 40.0  43.0  PLT 242  --   --   --   --   CREATININE 0.82 0.80  --   --   --   TROPONINIHS 53*  --  2,054*  --   --     Estimated Creatinine Clearance: 42.8 mL/min (by C-G formula based on SCr of 0.8 mg/dL).  Assessment: 29 yoF presented with high blood pressure with headache and slight chest pain. LHC on 7/19 with severe diffuse LAD disease. No anticoagulation prior to admission. Pharmacy consulted to dose heparin for ACS.  Heparin level < 0.1 is subtherapeutic with heparin infusion running at 650 units/hr. Hgb (13.6) and PLTs (242) stable. No issues with line or signs of bleeding per RN. Will not bolus due to cath procedure yesterday.   Goal of Therapy:  Heparin level 0.3-0.7 units/ml Monitor platelets by anticoagulation protocol: Yes   Plan:  Increase heparin infusion to 800 units/hr F/u 8hr heparin level Monitor daily HL and CBC Monitor for signs/symptoms of bleeding  Romie Minus, PharmD PGY1 Pharmacy Resident  Please check AMION for all [facility] Pharmacy phone numbers After 10:00 PM, call Main Pharmacy [phone]

## 2022-10-18 NOTE — Progress Notes (Signed)
Rounding Note    Patient Name: Beth Simon Date of Encounter: 10/18/2022  [facility] Cardiologist: Peter Swaziland, MD   Subjective   Feeling much better than yesterday.  No chest pain or dyspnea.  Able to lie fully supine in bed without difficulty.  Felt a little woozy when she first got up this morning, but she got up a second time and did not have any lightheadedness.  Inpatient Medications    Scheduled Meds:  aspirin EC  81 mg Oral Daily   atorvastatin  10 mg Oral Daily   clopidogrel  300 mg Oral Once   clopidogrel  75 mg Oral Q breakfast   sodium chloride flush  3 mL Intravenous Q12H   Continuous Infusions:  sodium chloride     heparin 650 Units/hr (10/18/22 0757)   sodium chloride     PRN Meds: sodium chloride, acetaminophen, hydrALAZINE, ondansetron (ZOFRAN) IV, sodium chloride, sodium chloride flush   Vital Signs    Vitals:   10/17/22 1855 10/17/22 1935 10/18/22 0404 10/18/22 0741  BP: (!) 101/47 (!) 118/53 (!) 103/54 (!) 107/57  Pulse: 72 77 71 74  Resp:  16 16 18   Temp:  98.5 F (36.9 C) (!) 97.4 F (36.3 C) 98.8 F (37.1 C)  TempSrc:  Oral Oral Oral  SpO2: 99% (!) 86% 96% 97%  Weight:   64.2 kg   Height:        Intake/Output Summary (Last 24 hours) at 10/18/2022 1107 Last data filed at 10/18/2022 0854 Gross per 24 hour  Intake 518.23 ml  Output --  Net 518.23 ml      10/18/2022    4:04 AM 10/17/2022    2:41 PM 10/17/2022   12:49 AM  Last 3 Weights  Weight (lbs) 141 lb 8.6 oz 130 lb 1.1 oz 134 lb  Weight (kg) 64.2 kg 59 kg 60.782 kg      Telemetry    Normal sinus rhythm- Personally Reviewed  ECG    Sinus rhythm, RBBB- Personally Reviewed  Physical Exam  Appears comfortable. GEN: No acute distress.   Neck: No JVD Cardiac: RRR, normal S1, widely split S2.  No murmurs, rubs, or gallops.  Respiratory: Clear to auscultation bilaterally. GI: Soft, nontender, non-distended  MS: No edema; No deformity. Neuro:  Nonfocal   Psych: Normal affect   Labs    High Sensitivity Troponin:   Recent Labs  Lab 10/17/22 0049 10/17/22 0353  TROPONINIHS 53* 2,054*     Chemistry Recent Labs  Lab 10/17/22 0049 10/17/22 0054 10/17/22 1616 10/17/22 1623 10/18/22 0814  NA 133* 132* 134* 135  135 133*  K 3.6 3.5 4.0 3.8  3.9 4.1  CL 95* 99  --   --  105  CO2 20*  --   --   --  19*  GLUCOSE 110* 110*  --   --  103*  BUN 12 12  --   --  8  CREATININE 0.82 0.80  --   --  0.77  CALCIUM 9.5  --   --   --  8.0*  MG 2.1  --   --   --   --   GFRNONAA >60  --   --   --  >60  ANIONGAP 18*  --   --   --  9    Lipids  Recent Labs  Lab 10/17/22 1216  CHOL 205*  TRIG 79  HDL 78  LDLCALC 111*  CHOLHDL 2.6  Hematology Recent Labs  Lab 10/17/22 0049 10/17/22 0054 10/17/22 1616 10/17/22 1623 10/18/22 0814  WBC 10.5  --   --   --  11.6*  RBC 4.29  --   --   --  4.36  HGB 13.0   < > 14.3 13.6  14.6 13.1  HCT 39.1   < > 42.0 40.0  43.0 38.7  MCV 91.1  --   --   --  88.8  MCH 30.3  --   --   --  30.0  MCHC 33.2  --   --   --  33.9  RDW 13.2  --   --   --  13.1  PLT 242  --   --   --  239   < > = values in this interval not displayed.   Thyroid  Recent Labs  Lab 10/17/22 1216  TSH 2.258    BNP Recent Labs  Lab 10/17/22 0049  BNP 33.3    DDimer No results for input(s): "DDIMER" in the last 168 hours.   Radiology    CARDIAC CATHETERIZATION  Result Date: 10/17/2022   Prox LAD to Mid LAD lesion is 45% stenosed at major 1st Diag followed by Mid LAD-1 lesion is 70% stenosed- just after 1st Sept (with ~ berry aneurysmal segment.  Not favorable for PCI.   Mid LAD-2 lesion is 70% stenosed.  Mid LAD-3 lesion is 85% stenosed. With difuse moderate disease throughout.   1st Diag lesion is 60% stenosed.   LPDA lesion is 90% stenosed.   LV end diastolic pressure is normal.   There is no aortic valve stenosis. POST-CATH DIAGNOSES Severe diffuse LAD disease and focal left PDA disease of the left dominant  system and separate ostia of LAD and dominant LCx. LAD has multiple sequential lesions ranging from 40% to 85-90% with 1 area involving branch points of major diagonal branch and septal perforator not very favorable for PCI especially given extent of calcification. Focal PDA 90% stenosis just prior to a very tortuous area.  (PCI amenable, but question of usefulness with the extent of disease in LAD). Well compensated CHF with completely normal Right Heart Cath and LV Pressures, but notably reduced Cardiac Output and Index (3.86, 2.51) PAP-mean 21/11-16 million mercury, PCWP 6 mm, LV P-EDP 128/2-11 mmHg with a AO P-MAP 124/60-86 mmHg. Ao sat 94%, PA sat 64%. RECOMMENDATIONS In the absence of any other complications or medical issues, we expect the patient to be ready for discharge from a cath perspective. Would optimize medical therapy for CAD and cardiomyopathy.  Appears to be well compensated. If she has worsening dyspnea on exertion or anginal equivalent type symptoms, could consider reevaluating PCI options, but for now would plan to treat medically.  Stabilized medically prior to discharge. Reinitiate IV heparin 2 hours after TR band removal and run for total of 48 hours. Based on likely ACS presentation and existing disease, would recommend at least 3 to 6 months of DAPT followed by completion of 1 year Plavix monotherapy. Will DC lisinopril with plans to potentially convert to ARB plus or minus Entresto. Bryan Lemma, MD  ECHOCARDIOGRAM COMPLETE  Result Date: 10/17/2022    ECHOCARDIOGRAM REPORT   Patient Name:   Beth Simon Date of Exam: 10/17/2022 Medical Rec #:  [id number]       Height:       58.0 in Accession #:    [id number]      Weight:       134.0  lb Date of Birth:  [birth year]       BSA:          1.536 m Patient Age:    82 years        BP:           140/74 mmHg Patient Gender: F               HR:           78 bpm. Exam Location:  Inpatient Procedure: 2D Echo, Color Doppler, Cardiac Doppler and  Intracardiac            Opacification Agent Indications:    NSTEMI  History:        Patient has no prior history of Echocardiogram examinations.                 Risk Factors:Hypertension and Dyslipidemia.  Sonographer:    Irving Burton Senior RDCS Referring Phys: [phone] RONDELL A Hartgrove  Sonographer Comments: Off axis windows due to thin body habitus and mastectomy IMPRESSIONS  1. There is akinesis of the distal 2/3 of the left ventricle with apical ballooning suggestive of possible Tako-Tsubo (stress-induced) cardiomyopathy. Left ventricular ejection fraction, by estimation, is 30 to 35%. The left ventricle has moderately decreased function. The left ventricle demonstrates regional wall motion abnormalities (see scoring diagram/findings for description). Left ventricular diastolic parameters are consistent with Grade II diastolic dysfunction (pseudonormalization).  2. Right ventricular systolic function is normal. The right ventricular size is normal. There is normal pulmonary artery systolic pressure.  3. Left atrial size was mild to moderately dilated.  4. The mitral valve is normal in structure. Mild mitral valve regurgitation. No evidence of mitral stenosis.  5. The aortic valve is tricuspid. There is mild calcification of the aortic valve. Aortic valve regurgitation is trivial. Aortic valve sclerosis/calcification is present, without any evidence of aortic stenosis.  6. The inferior vena cava is normal in size with greater than 50% respiratory variability, suggesting right atrial pressure of 3 mmHg. Conclusion(s)/Recommendation(s): There is akinesis of the distal 2/3 of the left ventricle with apical ballooning suggestive of possible Tako-Tsubo (stress-induced) cardiomyopathy. FINDINGS  Left Ventricle: There is akinesis of the distal 2/3 of the left ventricle with apical ballooning suggestive of possible Tako-Tsubo (stress-induced) cardiomyopathy. Left ventricular ejection fraction, by estimation, is 30 to 35%. The  left ventricle has moderately decreased function. The left ventricle demonstrates regional wall motion abnormalities. Definity contrast agent was given IV to delineate the left ventricular endocardial borders. The left ventricular internal cavity size was normal in size. There is no left ventricular hypertrophy. Left ventricular diastolic parameters are consistent with Grade II diastolic dysfunction (pseudonormalization).  LV Wall Scoring: The mid and distal anterior wall, mid and distal lateral wall, mid and distal anterior septum, mid and distal inferior wall, mid anterolateral segment, and mid inferoseptal segment are akinetic. Right Ventricle: The right ventricular size is normal. No increase in right ventricular wall thickness. Right ventricular systolic function is normal. There is normal pulmonary artery systolic pressure. The tricuspid regurgitant velocity is 2.73 m/s, and  with an assumed right atrial pressure of 3 mmHg, the estimated right ventricular systolic pressure is 32.8 mmHg. Left Atrium: Left atrial size was mild to moderately dilated. Right Atrium: Right atrial size was normal in size. Pericardium: There is no evidence of pericardial effusion. Mitral Valve: The mitral valve is normal in structure. Mild mitral valve regurgitation. No evidence of mitral valve stenosis. Tricuspid Valve: The tricuspid valve is normal in  structure. Tricuspid valve regurgitation is mild . No evidence of tricuspid stenosis. Aortic Valve: The aortic valve is tricuspid. There is mild calcification of the aortic valve. Aortic valve regurgitation is trivial. Aortic valve sclerosis/calcification is present, without any evidence of aortic stenosis. Pulmonic Valve: The pulmonic valve was normal in structure. Pulmonic valve regurgitation is not visualized. No evidence of pulmonic stenosis. Aorta: The aortic root is normal in size and structure. Venous: The inferior vena cava is normal in size with greater than 50% respiratory  variability, suggesting right atrial pressure of 3 mmHg. IAS/Shunts: No atrial level shunt detected by color flow Doppler.  LEFT VENTRICLE PLAX 2D LVIDd:         3.90 cm     Diastology LVIDs:         3.20 cm     LV e' medial:    4.79 cm/s LV PW:         0.70 cm     LV E/e' medial:  18.2 LV IVS:        0.90 cm     LV e' lateral:   4.68 cm/s LVOT diam:     1.80 cm     LV E/e' lateral: 18.6 LV SV:         42 LV SV Index:   27 LVOT Area:     2.54 cm  LV Volumes (MOD) LV vol d, MOD A2C: 92.8 ml LV vol d, MOD A4C: 98.2 ml LV vol s, MOD A2C: 46.2 ml LV vol s, MOD A4C: 50.6 ml LV SV MOD A2C:     46.6 ml LV SV MOD A4C:     98.2 ml LV SV MOD BP:      48.9 ml RIGHT VENTRICLE RV S prime:     12.20 cm/s TAPSE (M-mode): 2.0 cm LEFT ATRIUM             Index        RIGHT ATRIUM           Index LA diam:        3.40 cm 2.21 cm/m   RA Area:     10.80 cm LA Vol (A2C):   34.8 ml 22.65 ml/m  RA Volume:   22.80 ml  14.84 ml/m LA Vol (A4C):   55.0 ml 35.81 ml/m LA Biplane Vol: 44.1 ml 28.71 ml/m  AORTIC VALVE LVOT Vmax:   75.70 cm/s LVOT Vmean:  59.700 cm/s LVOT VTI:    0.165 m  AORTA Ao Root diam: 2.60 cm MITRAL VALVE               TRICUSPID VALVE MV Area (PHT): 3.95 cm    TR Peak grad:   29.8 mmHg MV Decel Time: 192 msec    TR Vmax:        273.00 cm/s MV E velocity: 87.00 cm/s MV A velocity: 69.80 cm/s  SHUNTS MV E/A ratio:  1.25        Systemic VTI:  0.16 m                            Systemic Diam: 1.80 cm Arvilla Meres MD Electronically signed by Arvilla Meres MD Signature Date/Time: 10/17/2022/11:18:59 AM    Final    DG Chest Port 1 View  Result Date: 10/17/2022 CLINICAL DATA:  82 year old female with history of chest pain. EXAM: PORTABLE CHEST 1 VIEW COMPARISON:  Chest x-ray 05/11/2015. FINDINGS: Lung volumes are normal. Diffuse  interstitial prominence and widespread peribronchial cuffing. No consolidative airspace disease. No pleural effusions. No pneumothorax. No evidence of pulmonary edema. Heart size is normal.  Upper mediastinal contours are within normal limits. Surgical clips project over the left axillary region and left breast, likely from prior lumpectomy and axillary lymph node dissection. IMPRESSION: 1. Diffuse interstitial prominence and widespread peribronchial cuffing, concerning for an acute bronchitis. Electronically Signed   By: Trudie Reed M.D.   On: 10/17/2022 08:20    Cardiac Studies   Dominance: Right     PAP-mean 21/11-16 million mercury, PCWP 6 mm, LV P-EDP 128/2-11 mmHg with a AO P-MAP 124/60-86 mmHg. Ao sat 94%, PA sat 64%.    Patient Profile     82 y.o. female with hypertension, hyperlipidemia and remote history of breast cancer presented with palpitations and elevated BP, but without chest pain, found to have left ventricular dysfunction with regional wall motion abnormalities in a pattern suggestive of Takotsubo syndrome versus LAD ischemia.  Troponin increased to 52,000.  Assessment & Plan    Differential diagnosis is non-STEMI due to demand ischemia versus small aborted type I myocardial infarction, versus Takotsubo syndrome. Anatomy is not favorable for percutaneous revascularization.  Medical therapy was recommended.  Intravenous heparin for total of 72 hours (until tomorrow), long-term aspirin plus clopidogrel. Cardiomyopathy with carvedilol and RAAS inhibitor (was on ACE inhibitor, transition to ARB, eventually Entresto and spironolactone).  She does not have overt heart failure findings, LVEDP was low and yesterday she was actually hypotensive/hypovolemic.  Therefore the benefit of SGLT2 inhibitor less clear. Aggressive treatment of hypercholesterolemia to target LDL less than 70.  Realistic to expect discharge tomorrow, with follow-up outpatient echocardiogram and clinic appointment.  For questions or updates, please contact [facility] Please consult [email or web address] for contact info under        Signed, Thurmon Fair, MD  10/18/2022, 11:07 AM

## 2022-10-18 NOTE — Plan of Care (Signed)

## 2022-10-19 DIAGNOSIS — I214 Non-ST elevation (NSTEMI) myocardial infarction: Secondary | ICD-10-CM | POA: Diagnosis not present

## 2022-10-19 LAB — CBC WITH DIFFERENTIAL/PLATELET
Abs Immature Granulocytes: 0.03 10*3/uL (ref 0.00–0.07)
Basophils Absolute: 0.1 10*3/uL (ref 0.0–0.1)
Basophils Relative: 1 %
Eosinophils Absolute: 0.4 10*3/uL (ref 0.0–0.5)
Eosinophils Relative: 4 %
HCT: 35.2 % — ABNORMAL LOW (ref 36.0–46.0)
Hemoglobin: 11.7 g/dL — ABNORMAL LOW (ref 12.0–15.0)
Immature Granulocytes: 0 %
Lymphocytes Relative: 32 %
Lymphs Abs: 3.1 10*3/uL (ref 0.7–4.0)
MCH: 31.1 pg (ref 26.0–34.0)
MCHC: 33.2 g/dL (ref 30.0–36.0)
MCV: 93.6 fL (ref 80.0–100.0)
Monocytes Absolute: 1.1 10*3/uL — ABNORMAL HIGH (ref 0.1–1.0)
Monocytes Relative: 12 %
Neutro Abs: 4.7 10*3/uL (ref 1.7–7.7)
Neutrophils Relative %: 51 %
Platelets: 176 10*3/uL (ref 150–400)
RBC: 3.76 MIL/uL — ABNORMAL LOW (ref 3.87–5.11)
RDW: 13.3 % (ref 11.5–15.5)
WBC: 9.4 10*3/uL (ref 4.0–10.5)
nRBC: 0 % (ref 0.0–0.2)

## 2022-10-19 LAB — BASIC METABOLIC PANEL
Anion gap: 6 (ref 5–15)
BUN: 11 mg/dL (ref 8–23)
CO2: 18 mmol/L — ABNORMAL LOW (ref 22–32)
Calcium: 7.5 mg/dL — ABNORMAL LOW (ref 8.9–10.3)
Chloride: 107 mmol/L (ref 98–111)
Creatinine, Ser: 0.8 mg/dL (ref 0.44–1.00)
GFR, Estimated: >60 mL/min (ref >60)
Glucose, Bld: 92 mg/dL (ref 70–99)
Potassium: 3.8 mmol/L (ref 3.5–5.1)
Sodium: 131 mmol/L — ABNORMAL LOW (ref 135–145)

## 2022-10-19 LAB — HEPARIN LEVEL (UNFRACTIONATED): Heparin Unfractionated: 0.22 IU/mL — ABNORMAL LOW (ref 0.30–0.70)

## 2022-10-19 MED ORDER — ASPIRIN 81 MG PO TBEC: 81.0000 mg | DELAYED_RELEASE_TABLET | Freq: Every day | ORAL | 0 refills | Status: DC

## 2022-10-19 MED ORDER — LOSARTAN POTASSIUM 25 MG PO TABS: 12.5000 mg | ORAL_TABLET | Freq: Every day | ORAL | 0 refills | Status: DC

## 2022-10-19 MED ORDER — ATORVASTATIN CALCIUM 40 MG PO TABS: 40.0000 mg | ORAL_TABLET | Freq: Every day | ORAL | 0 refills | Status: DC

## 2022-10-19 MED ORDER — CLOPIDOGREL BISULFATE 75 MG PO TABS: 75.0000 mg | ORAL_TABLET | Freq: Every day | ORAL | 0 refills | Status: DC

## 2022-10-19 MED ORDER — CARVEDILOL 3.125 MG PO TABS: 3.1250 mg | ORAL_TABLET | Freq: Two times a day (BID) | ORAL | 0 refills | Status: DC

## 2022-10-19 NOTE — Progress Notes (Addendum)
ANTICOAGULATION CONSULT NOTE- Follow Up  Pharmacy Consult for heparin Indication: multivessel CAD  Allergies  Allergen Reactions   Celecoxib Hypertension   Kenalog [Triamcinolone]     Leg weakness/burning  injection   Prednisone Other (See Comments)    Unsteady gait per MD instructions  NO STEROIDS   Procaine Palpitations    Heart palpitations    Patient Measurements: Height: 4' 10.5" (148.6 cm) Weight: 64.2 kg (141 lb 8.6 oz) IBW/kg (Calculated) : 42.05 Heparin Dosing Weight: 54 kg  Vital Signs: Temp: 98.2 F (36.8 C) (07/20 2019) Temp Source: Oral (07/20 2019) BP: 107/54 (07/20 2019) Pulse Rate: 77 (07/20 2019)  Labs: Recent Labs    10/17/22 0049 10/17/22 0054 10/17/22 0353 10/17/22 1616 10/17/22 1623 10/18/22 0814 10/18/22 1002 10/18/22 2110  HGB 13.0 13.3  --  14.3 13.6  14.6 13.1  --   --   HCT 39.1 39.0  --  42.0 40.0  43.0 38.7  --   --   PLT 242  --   --   --   --  239  --   --   HEPARINUNFRC  --   --   --   --   --   --  <0.10* <0.10*  CREATININE 0.82 0.80  --   --   --  0.77  --   --   TROPONINIHS 53*  --  2,054*  --   --   --   --   --     Estimated Creatinine Clearance: 42.8 mL/min (by C-G formula based on SCr of 0.77 mg/dL).  Assessment: 63 yoF presented with high blood pressure with headache and slight chest pain. LHC on 7/19 with severe diffuse LAD disease. No anticoagulation prior to admission. Pharmacy consulted to dose heparin for ACS.  Heparin level < 0.1 is subtherapeutic with heparin infusion running at 650 units/hr. Hgb (13.6) and PLTs (242) stable. No issues with line or signs of bleeding per RN. Will not bolus due to cath procedure yesterday.   7/20 PM: Heparin level <0.1. Per RN heparin was paused at 1757 and unsure what time restarted, but was running at shift change (~1920). Given information will continue the same rate and recheck level in AM. Heparin to stop at 0600.  Goal of Therapy:  Heparin level 0.3-0.7 units/ml Monitor  platelets by anticoagulation protocol: Yes   Plan:  Continue heparin infusion at 800 units/hr F/u 8hr heparin level Monitor daily HL and CBC Monitor for signs/symptoms of bleeding  [name], PharmD. Clinical Pharmacist 10/19/2022 12:05 AM    ADDENDUM - Heparin level returned at 0.22 on 800 units/h. Heparin gtt to stop at 6AM. Will continue current rate since to be timed off shortly. Thanks  [name], PharmD. Clinical Pharmacist 10/19/2022 5:04 AM

## 2022-10-19 NOTE — Progress Notes (Signed)
Discharge instructions provided. Patient and patient's son verbalize understanding of all instructions and follow-up care. Patient discharged to home with all belongings at 1530 on 10/19/22 via wheelchair by NT.

## 2022-10-19 NOTE — Discharge Summary (Signed)
Physician Discharge Summary   Patient: Beth Simon MRN: [id number] DOB: 05/09/1940  Admit date:     10/17/2022  Discharge date: 10/19/22  Discharge Physician: Briant Cedar   PCP: Merlene Laughter, MD (Inactive)   Recommendations at discharge:   Follow-up with PCP in 1 week Follow-up with cardiology as scheduled  Discharge Diagnoses: Principal Problem:   NSTEMI (non-ST elevated myocardial infarction) (HCC) Active Problems:   Palpitations   Acute combined systolic and diastolic congestive heart failure (HCC)   Essential hypertension   Hyperlipidemia   Hyponatremia   Fatigue   History of uterine cancer   History of breast cancer    Hospital Course: Beth Simon is a 82 y.o. female with medical history significant of HTN, HLD, breast cancer s/p mastectomy and chemotherapy, uterine cancer s/p hysterectomy with radiation who presents with complaints of palpitations and uncontrolled BP X 1 day. In the ED, labs showed HS trop 53->2054, EKG noted no acute ST wave changes. Cardiology was consulted. Patient was started on heparin drip.  Patient admitted for further management.     Today, patient denies any new complaints.  Denies any chest pain, worsening shortness of breath, nausea/vomiting, fever/chills.  Assessment and Plan:  NSTEMI Vs Takotsubo syndrome HS trop 53->2054, EKG noted no acute ST wave changes Echo revealed EF 30-35% with left apical ballooning and grade II diastolic dysfunction LHC on 7/19 showed complex CAD, including moderate to severe disease of the mid LAD.  No revascularization done as not favorable, with limited option Continue long-term aspirin plus clopidogrel, Lipitor Outpatient cardiology follow-up in 2 weeks   Acute systolic and diastolic heart failure Echo revealed EF 30-35% with left apical ballooning and grade II diastolic dysfunction Continue coreg, losartan Outpatient cardiology follow-up   Essential hypertension  Continue losartan,  coreg  Hyperlipidemia LDL 111 Continue increased dose of atorvastatin    Hyponatremia PCP to repeat CBC  Acute drop in hemoglobin Possibly dilutional PCP to repeat CBC   History of breast and uterine cancer          Consultants: Cardiology Procedures performed: LHC on 7/19 Disposition: Home Diet recommendation:  Cardiac diet    DISCHARGE MEDICATION: Allergies as of 10/19/2022       Reactions   Celecoxib Hypertension   Kenalog [triamcinolone]    Leg weakness/burning  injection   Prednisone Other (See Comments)   Unsteady gait per MD instructions NO STEROIDS   Procaine Palpitations   Heart palpitations        Medication List     STOP taking these medications    lisinopril 5 MG tablet Commonly known as: ZESTRIL       TAKE these medications    aspirin EC 81 MG tablet Take 1 tablet (81 mg total) by mouth daily. Swallow whole. Start taking on: October 20, 2022   atorvastatin 40 MG tablet Commonly known as: LIPITOR Take 1 tablet (40 mg total) by mouth daily. Start taking on: October 20, 2022 What changed:  medication strength how much to take   carvedilol 3.125 MG tablet Commonly known as: COREG Take 1 tablet (3.125 mg total) by mouth 2 (two) times daily with a meal.   clopidogrel 75 MG tablet Commonly known as: PLAVIX Take 1 tablet (75 mg total) by mouth daily with breakfast. Start taking on: October 20, 2022   COQ10 PO Take 1 tablet by mouth every morning.   denosumab 60 MG/ML Soln injection Commonly known as: PROLIA Inject 60 mg into the skin every  6 (six) months. Administer in upper arm, thigh, or abdomen   FLAX SEEDS PO Take 1 scoop by mouth every morning. Ground flaxseed in cereal.   losartan 25 MG tablet Commonly known as: COZAAR Take 0.5 tablets (12.5 mg total) by mouth daily. Start taking on: October 20, 2022   OVER THE COUNTER MEDICATION Take 1 tablet by mouth every morning. Astazanthin. (anti-oxidant)   PreserVision AREDS  Caps Take 1 capsule by mouth daily.   VITAMIN B 12 PO Take 1 tablet by mouth every morning.   VITAMIN D (CHOLECALCIFEROL) PO Take 4,000 Int'l Units by mouth every morning.        Follow-up Information     Stoneking, Hal, MD. Schedule an appointment as soon as possible for a visit in 1 week(s).   Specialty: Internal Medicine Contact information: [address] [place] [address] [phone]                Discharge Exam: Beth Simon Weights   10/17/22 1441 10/18/22 0404 10/19/22 0350  Weight: 59 kg 64.2 kg 59.9 kg   General: NAD  Cardiovascular: S1, S2 present Respiratory: CTAB Abdomen: Soft, nontender, nondistended, bowel sounds present Musculoskeletal: No bilateral pedal edema noted Skin: Normal Psychiatry: Normal mood   Condition at discharge: stable  The results of significant diagnostics from this hospitalization (including imaging, microbiology, ancillary and laboratory) are listed below for reference.   Imaging Studies: CARDIAC CATHETERIZATION  Result Date: 10/17/2022   Prox LAD to Mid LAD lesion is 45% stenosed at major 1st Diag followed by Mid LAD-1 lesion is 70% stenosed- just after 1st Sept (with ~ berry aneurysmal segment.  Not favorable for PCI.   Mid LAD-2 lesion is 70% stenosed.  Mid LAD-3 lesion is 85% stenosed. With difuse moderate disease throughout.   1st Diag lesion is 60% stenosed.   LPDA lesion is 90% stenosed.   LV end diastolic pressure is normal.   There is no aortic valve stenosis. POST-CATH DIAGNOSES Severe diffuse LAD disease and focal left PDA disease of the left dominant system and separate ostia of LAD and dominant LCx. LAD has multiple sequential lesions ranging from 40% to 85-90% with 1 area involving branch points of major diagonal branch and septal perforator not very favorable for PCI especially given extent of calcification. Focal PDA 90% stenosis just prior to a very tortuous area.  (PCI amenable, but question of  usefulness with the extent of disease in LAD). Well compensated CHF with completely normal Right Heart Cath and LV Pressures, but notably reduced Cardiac Output and Index (3.86, 2.51) PAP-mean 21/11-16 million mercury, PCWP 6 mm, LV P-EDP 128/2-11 mmHg with a AO P-MAP 124/60-86 mmHg. Ao sat 94%, PA sat 64%. RECOMMENDATIONS In the absence of any other complications or medical issues, we expect the patient to be ready for discharge from a cath perspective. Would optimize medical therapy for CAD and cardiomyopathy.  Appears to be well compensated. If she has worsening dyspnea on exertion or anginal equivalent type symptoms, could consider reevaluating PCI options, but for now would plan to treat medically.  Stabilized medically prior to discharge. Reinitiate IV heparin 2 hours after TR band removal and run for total of 48 hours. Based on likely ACS presentation and existing disease, would recommend at least 3 to 6 months of DAPT followed by completion of 1 year Plavix monotherapy. Will DC lisinopril with plans to potentially convert to ARB plus or minus Entresto. Bryan Lemma, MD  ECHOCARDIOGRAM COMPLETE  Result Date:  10/17/2022    ECHOCARDIOGRAM REPORT   Patient Name:   JESSABELLE MARKIEWICZ Date of Exam: 10/17/2022 Medical Rec #:  [id number]       Height:       58.0 in Accession #:    [id number]      Weight:       134.0 lb Date of Birth:  1940/11/19       BSA:          1.536 m Patient Age:    82 years        BP:           140/74 mmHg Patient Gender: F               HR:           78 bpm. Exam Location:  Inpatient Procedure: 2D Echo, Color Doppler, Cardiac Doppler and Intracardiac            Opacification Agent Indications:    NSTEMI  History:        Patient has no prior history of Echocardiogram examinations.                 Risk Factors:Hypertension and Dyslipidemia.  Sonographer:    Irving Burton Senior RDCS Referring Phys: [phone] RONDELL A Fulop  Sonographer Comments: Off axis windows due to thin body habitus and mastectomy  IMPRESSIONS  1. There is akinesis of the distal 2/3 of the left ventricle with apical ballooning suggestive of possible Tako-Tsubo (stress-induced) cardiomyopathy. Left ventricular ejection fraction, by estimation, is 30 to 35%. The left ventricle has moderately decreased function. The left ventricle demonstrates regional wall motion abnormalities (see scoring diagram/findings for description). Left ventricular diastolic parameters are consistent with Grade II diastolic dysfunction (pseudonormalization).  2. Right ventricular systolic function is normal. The right ventricular size is normal. There is normal pulmonary artery systolic pressure.  3. Left atrial size was mild to moderately dilated.  4. The mitral valve is normal in structure. Mild mitral valve regurgitation. No evidence of mitral stenosis.  5. The aortic valve is tricuspid. There is mild calcification of the aortic valve. Aortic valve regurgitation is trivial. Aortic valve sclerosis/calcification is present, without any evidence of aortic stenosis.  6. The inferior vena cava is normal in size with greater than 50% respiratory variability, suggesting right atrial pressure of 3 mmHg. Conclusion(s)/Recommendation(s): There is akinesis of the distal 2/3 of the left ventricle with apical ballooning suggestive of possible Tako-Tsubo (stress-induced) cardiomyopathy. FINDINGS  Left Ventricle: There is akinesis of the distal 2/3 of the left ventricle with apical ballooning suggestive of possible Tako-Tsubo (stress-induced) cardiomyopathy. Left ventricular ejection fraction, by estimation, is 30 to 35%. The left ventricle has moderately decreased function. The left ventricle demonstrates regional wall motion abnormalities. Definity contrast agent was given IV to delineate the left ventricular endocardial borders. The left ventricular internal cavity size was normal in size. There is no left ventricular hypertrophy. Left ventricular diastolic parameters are  consistent with Grade II diastolic dysfunction (pseudonormalization).  LV Wall Scoring: The mid and distal anterior wall, mid and distal lateral wall, mid and distal anterior septum, mid and distal inferior wall, mid anterolateral segment, and mid inferoseptal segment are akinetic. Right Ventricle: The right ventricular size is normal. No increase in right ventricular wall thickness. Right ventricular systolic function is normal. There is normal pulmonary artery systolic pressure. The tricuspid regurgitant velocity is 2.73 m/s, and  with an assumed right atrial pressure of 3 mmHg, the estimated right ventricular systolic  pressure is 32.8 mmHg. Left Atrium: Left atrial size was mild to moderately dilated. Right Atrium: Right atrial size was normal in size. Pericardium: There is no evidence of pericardial effusion. Mitral Valve: The mitral valve is normal in structure. Mild mitral valve regurgitation. No evidence of mitral valve stenosis. Tricuspid Valve: The tricuspid valve is normal in structure. Tricuspid valve regurgitation is mild . No evidence of tricuspid stenosis. Aortic Valve: The aortic valve is tricuspid. There is mild calcification of the aortic valve. Aortic valve regurgitation is trivial. Aortic valve sclerosis/calcification is present, without any evidence of aortic stenosis. Pulmonic Valve: The pulmonic valve was normal in structure. Pulmonic valve regurgitation is not visualized. No evidence of pulmonic stenosis. Aorta: The aortic root is normal in size and structure. Venous: The inferior vena cava is normal in size with greater than 50% respiratory variability, suggesting right atrial pressure of 3 mmHg. IAS/Shunts: No atrial level shunt detected by color flow Doppler.  LEFT VENTRICLE PLAX 2D LVIDd:         3.90 cm     Diastology LVIDs:         3.20 cm     LV e' medial:    4.79 cm/s LV PW:         0.70 cm     LV E/e' medial:  18.2 LV IVS:        0.90 cm     LV e' lateral:   4.68 cm/s LVOT diam:      1.80 cm     LV E/e' lateral: 18.6 LV SV:         42 LV SV Index:   27 LVOT Area:     2.54 cm  LV Volumes (MOD) LV vol d, MOD A2C: 92.8 ml LV vol d, MOD A4C: 98.2 ml LV vol s, MOD A2C: 46.2 ml LV vol s, MOD A4C: 50.6 ml LV SV MOD A2C:     46.6 ml LV SV MOD A4C:     98.2 ml LV SV MOD BP:      48.9 ml RIGHT VENTRICLE RV S prime:     12.20 cm/s TAPSE (M-mode): 2.0 cm LEFT ATRIUM             Index        RIGHT ATRIUM           Index LA diam:        3.40 cm 2.21 cm/m   RA Area:     10.80 cm LA Vol (A2C):   34.8 ml 22.65 ml/m  RA Volume:   22.80 ml  14.84 ml/m LA Vol (A4C):   55.0 ml 35.81 ml/m LA Biplane Vol: 44.1 ml 28.71 ml/m  AORTIC VALVE LVOT Vmax:   75.70 cm/s LVOT Vmean:  59.700 cm/s LVOT VTI:    0.165 m  AORTA Ao Root diam: 2.60 cm MITRAL VALVE               TRICUSPID VALVE MV Area (PHT): 3.95 cm    TR Peak grad:   29.8 mmHg MV Decel Time: 192 msec    TR Vmax:        273.00 cm/s MV E velocity: 87.00 cm/s MV A velocity: 69.80 cm/s  SHUNTS MV E/A ratio:  1.25        Systemic VTI:  0.16 m                            Systemic  Diam: 1.80 cm Arvilla Meres MD Electronically signed by Arvilla Meres MD Signature Date/Time: 10/17/2022/11:18:59 AM    Final    DG Chest Port 1 View  Result Date: 10/17/2022 CLINICAL DATA:  82 year old female with history of chest pain. EXAM: PORTABLE CHEST 1 VIEW COMPARISON:  Chest x-ray 05/11/2015. FINDINGS: Lung volumes are normal. Diffuse interstitial prominence and widespread peribronchial cuffing. No consolidative airspace disease. No pleural effusions. No pneumothorax. No evidence of pulmonary edema. Heart size is normal. Upper mediastinal contours are within normal limits. Surgical clips project over the left axillary region and left breast, likely from prior lumpectomy and axillary lymph node dissection. IMPRESSION: 1. Diffuse interstitial prominence and widespread peribronchial cuffing, concerning for an acute bronchitis. Electronically Signed   By: Trudie Reed M.D.    On: 10/17/2022 08:20    Microbiology: Results for orders placed or performed in visit on 05/16/16  Urine Culture     Status: None   Collection Time: 05/16/16  4:57 PM   Specimen: Urine  Result Value Ref Range Status   Organism ID, Bacteria NO GROWTH  Final    Labs: CBC: Recent Labs  Lab 10/17/22 0049 10/17/22 0054 10/17/22 1616 10/17/22 1623 10/18/22 0814 10/19/22 0308  WBC 10.5  --   --   --  11.6* 9.4  NEUTROABS  --   --   --   --  7.5 4.7  HGB 13.0 13.3 14.3 13.6  14.6 13.1 11.7*  HCT 39.1 39.0 42.0 40.0  43.0 38.7 35.2*  MCV 91.1  --   --   --  88.8 93.6  PLT 242  --   --   --  239 176   Basic Metabolic Panel: Recent Labs  Lab 10/17/22 0049 10/17/22 0054 10/17/22 1616 10/17/22 1623 10/18/22 0814 10/19/22 0308  NA 133* 132* 134* 135  135 133* 131*  K 3.6 3.5 4.0 3.8  3.9 4.1 3.8  CL 95* 99  --   --  105 107  CO2 20*  --   --   --  19* 18*  GLUCOSE 110* 110*  --   --  103* 92  BUN 12 12  --   --  8 11  CREATININE 0.82 0.80  --   --  0.77 0.80  CALCIUM 9.5  --   --   --  8.0* 7.5*  MG 2.1  --   --   --   --   --    Liver Function Tests: No results for input(s): "AST", "ALT", "ALKPHOS", "BILITOT", "PROT", "ALBUMIN" in the last 168 hours. CBG: No results for input(s): "GLUCAP" in the last 168 hours.  Discharge time spent: greater than 30 minutes.  Signed: Briant Cedar, MD [facility] 10/19/2022

## 2022-10-19 NOTE — Plan of Care (Signed)
  Problem: Education: Goal: Understanding of cardiac disease, CV risk reduction, and recovery process will improve 10/19/2022 1548 by Reynold Bowen, RN Outcome: Adequate for Discharge 10/19/2022 1033 by Reynold Bowen, RN Outcome: Progressing Goal: Individualized Educational Video(s) 10/19/2022 1548 by Reynold Bowen, RN Outcome: Adequate for Discharge 10/19/2022 1033 by Reynold Bowen, RN Outcome: Progressing   Problem: Activity: Goal: Ability to tolerate increased activity will improve 10/19/2022 1548 by Reynold Bowen, RN Outcome: Adequate for Discharge 10/19/2022 1033 by Reynold Bowen, RN Outcome: Progressing   Problem: Cardiac: Goal: Ability to achieve and maintain adequate cardiovascular perfusion will improve 10/19/2022 1548 by Reynold Bowen, RN Outcome: Adequate for Discharge 10/19/2022 1033 by Reynold Bowen, RN Outcome: Progressing   Problem: Health Behavior/Discharge Planning: Goal: Ability to safely manage health-related needs after discharge will improve 10/19/2022 1548 by Reynold Bowen, RN Outcome: Adequate for Discharge 10/19/2022 1033 by Reynold Bowen, RN Outcome: Progressing   Problem: Education: Goal: Understanding of CV disease, CV risk reduction, and recovery process will improve Outcome: Adequate for Discharge Goal: Individualized Educational Video(s) Outcome: Adequate for Discharge   Problem: Activity: Goal: Ability to return to baseline activity level will improve Outcome: Adequate for Discharge   Problem: Cardiovascular: Goal: Ability to achieve and maintain adequate cardiovascular perfusion will improve Outcome: Adequate for Discharge Goal: Vascular access site(s) Level 0-1 will be maintained Outcome: Adequate for Discharge   Problem: Health Behavior/Discharge Planning: Goal: Ability to safely manage health-related needs after discharge will improve Outcome: Adequate  for Discharge   Problem: Education: Goal: Knowledge of General Education information will improve Description: Including pain rating scale, medication(s)/side effects and non-pharmacologic comfort measures Outcome: Adequate for Discharge   Problem: Health Behavior/Discharge Planning: Goal: Ability to manage health-related needs will improve Outcome: Adequate for Discharge   Problem: Clinical Measurements: Goal: Ability to maintain clinical measurements within normal limits will improve Outcome: Adequate for Discharge Goal: Will remain free from infection Outcome: Adequate for Discharge Goal: Diagnostic test results will improve Outcome: Adequate for Discharge Goal: Respiratory complications will improve Outcome: Adequate for Discharge Goal: Cardiovascular complication will be avoided Outcome: Adequate for Discharge   Problem: Activity: Goal: Risk for activity intolerance will decrease Outcome: Adequate for Discharge   Problem: Nutrition: Goal: Adequate nutrition will be maintained Outcome: Adequate for Discharge   Problem: Coping: Goal: Level of anxiety will decrease Outcome: Adequate for Discharge   Problem: Elimination: Goal: Will not experience complications related to bowel motility Outcome: Adequate for Discharge Goal: Will not experience complications related to urinary retention Outcome: Adequate for Discharge   Problem: Pain Managment: Goal: General experience of comfort will improve Outcome: Adequate for Discharge   Problem: Safety: Goal: Ability to remain free from injury will improve Outcome: Adequate for Discharge   Problem: Skin Integrity: Goal: Risk for impaired skin integrity will decrease Outcome: Adequate for Discharge

## 2022-10-19 NOTE — Plan of Care (Signed)
  Problem: Education: Goal: Understanding of cardiac disease, CV risk reduction, and recovery process will improve Outcome: Progressing Goal: Individualized Educational Video(s) Outcome: Progressing   Problem: Activity: Goal: Ability to tolerate increased activity will improve Outcome: Progressing   Problem: Cardiac: Goal: Ability to achieve and maintain adequate cardiovascular perfusion will improve Outcome: Progressing   Problem: Health Behavior/Discharge Planning: Goal: Ability to safely manage health-related needs after discharge will improve Outcome: Progressing   Problem: Education: Goal: Understanding of CV disease, CV risk reduction, and recovery process will improve Outcome: Progressing Goal: Individualized Educational Video(s) Outcome: Progressing   Problem: Education: Goal: Understanding of CV disease, CV risk reduction, and recovery process will improve Outcome: Progressing Goal: Individualized Educational Video(s) Outcome: Progressing   Problem: Education: Goal: Understanding of CV disease, CV risk reduction, and recovery process will improve Outcome: Progressing Goal: Individualized Educational Video(s) Outcome: Progressing

## 2022-10-19 NOTE — Plan of Care (Signed)

## 2022-10-19 NOTE — Progress Notes (Addendum)
Rounding Note    Patient Name: Beth Simon Date of Encounter: 10/19/2022  [facility] Cardiologist: Peter Swaziland, MD   Subjective   She feels well.  She does not have orthopnea and did not have dyspnea walking in the hallway.  Denies any chest pain at rest or with activity.  No significant arrhythmia on monitor.  Inpatient Medications    Scheduled Meds:  aspirin EC  81 mg Oral Daily   atorvastatin  40 mg Oral Daily   carvedilol  3.125 mg Oral BID WC   clopidogrel  300 mg Oral Once   clopidogrel  75 mg Oral Q breakfast   losartan  12.5 mg Oral Daily   sodium chloride flush  3 mL Intravenous Q12H   Continuous Infusions:  sodium chloride     sodium chloride     PRN Meds: sodium chloride, acetaminophen, hydrALAZINE, ondansetron (ZOFRAN) IV, sodium chloride, sodium chloride flush   Vital Signs    Vitals:   10/19/22 0311 10/19/22 0350 10/19/22 0653 10/19/22 0715  BP: (!) 97/57  109/61 111/63  Pulse: 66  65 66  Resp: 16   16  Temp: 98.1 F (36.7 C)   97.7 F (36.5 C)  TempSrc: Oral   Oral  SpO2: 98%  96% 97%  Weight:  59.9 kg    Height:        Intake/Output Summary (Last 24 hours) at 10/19/2022 1205 Last data filed at 10/19/2022 0310 Gross per 24 hour  Intake 621.56 ml  Output 350 ml  Net 271.56 ml      10/19/2022    3:50 AM 10/18/2022    4:04 AM 10/17/2022    2:41 PM  Last 3 Weights  Weight (lbs) 132 lb 141 lb 8.6 oz 130 lb 1.1 oz  Weight (kg) 59.875 kg 64.2 kg 59 kg      Telemetry    Sinus rhythm, rare PACs and PVCs- Personally Reviewed  ECG    No new tracing- Personally Reviewed  Physical Exam  Appears very comfortable GEN: No acute distress.   Neck: No JVD Cardiac: RRR, no murmurs, rubs, or gallops.  Respiratory: Clear to auscultation bilaterally. GI: Soft, nontender, non-distended  MS: No edema; No deformity. Neuro:  Nonfocal  Psych: Normal affect   Labs    High Sensitivity Troponin:   Recent Labs  Lab 10/17/22 0049  10/17/22 0353  TROPONINIHS 53* 2,054*     Chemistry Recent Labs  Lab 10/17/22 0049 10/17/22 0054 10/17/22 1616 10/17/22 1623 10/18/22 0814 10/19/22 0308  NA 133* 132*   < > 135  135 133* 131*  K 3.6 3.5   < > 3.8  3.9 4.1 3.8  CL 95* 99  --   --  105 107  CO2 20*  --   --   --  19* 18*  GLUCOSE 110* 110*  --   --  103* 92  BUN 12 12  --   --  8 11  CREATININE 0.82 0.80  --   --  0.77 0.80  CALCIUM 9.5  --   --   --  8.0* 7.5*  MG 2.1  --   --   --   --   --   GFRNONAA >60  --   --   --  >60 >60  ANIONGAP 18*  --   --   --  9 6   < > = values in this interval not displayed.    Lipids  Recent  Labs  Lab 10/17/22 1216  CHOL 205*  TRIG 79  HDL 78  LDLCALC 111*  CHOLHDL 2.6    Hematology Recent Labs  Lab 10/17/22 0049 10/17/22 0054 10/17/22 1623 10/18/22 0814 10/19/22 0308  WBC 10.5  --   --  11.6* 9.4  RBC 4.29  --   --  4.36 3.76*  HGB 13.0   < > 13.6  14.6 13.1 11.7*  HCT 39.1   < > 40.0  43.0 38.7 35.2*  MCV 91.1  --   --  88.8 93.6  MCH 30.3  --   --  30.0 31.1  MCHC 33.2  --   --  33.9 33.2  RDW 13.2  --   --  13.1 13.3  PLT 242  --   --  239 176   < > = values in this interval not displayed.   Thyroid  Recent Labs  Lab 10/17/22 1216  TSH 2.258    BNP Recent Labs  Lab 10/17/22 0049  BNP 33.3    DDimer No results for input(s): "DDIMER" in the last 168 hours.   Radiology    CARDIAC CATHETERIZATION  Result Date: 10/17/2022   Prox LAD to Mid LAD lesion is 45% stenosed at major 1st Diag followed by Mid LAD-1 lesion is 70% stenosed- just after 1st Sept (with ~ berry aneurysmal segment.  Not favorable for PCI.   Mid LAD-2 lesion is 70% stenosed.  Mid LAD-3 lesion is 85% stenosed. With difuse moderate disease throughout.   1st Diag lesion is 60% stenosed.   LPDA lesion is 90% stenosed.   LV end diastolic pressure is normal.   There is no aortic valve stenosis. POST-CATH DIAGNOSES Severe diffuse LAD disease and focal left PDA disease of the left  dominant system and separate ostia of LAD and dominant LCx. LAD has multiple sequential lesions ranging from 40% to 85-90% with 1 area involving branch points of major diagonal branch and septal perforator not very favorable for PCI especially given extent of calcification. Focal PDA 90% stenosis just prior to a very tortuous area.  (PCI amenable, but question of usefulness with the extent of disease in LAD). Well compensated CHF with completely normal Right Heart Cath and LV Pressures, but notably reduced Cardiac Output and Index (3.86, 2.51) PAP-mean 21/11-16 million mercury, PCWP 6 mm, LV P-EDP 128/2-11 mmHg with a AO P-MAP 124/60-86 mmHg. Ao sat 94%, PA sat 64%. RECOMMENDATIONS In the absence of any other complications or medical issues, we expect the patient to be ready for discharge from a cath perspective. Would optimize medical therapy for CAD and cardiomyopathy.  Appears to be well compensated. If she has worsening dyspnea on exertion or anginal equivalent type symptoms, could consider reevaluating PCI options, but for now would plan to treat medically.  Stabilized medically prior to discharge. Reinitiate IV heparin 2 hours after TR band removal and run for total of 48 hours. Based on likely ACS presentation and existing disease, would recommend at least 3 to 6 months of DAPT followed by completion of 1 year Plavix monotherapy. Will DC lisinopril with plans to potentially convert to ARB plus or minus Entresto. Bryan Lemma, MD   Cardiac Studies    Dominance: Right      PAP-mean 21/11-16 million mercury, PCWP 6 mm, LV P-EDP 128/2-11 mmHg with a AO P-MAP 124/60-86 mmHg. Ao sat 94%, PA sat 64%.    Patient Profile     82 y.o. female  with hypertension, hyperlipidemia and remote  history of breast cancer presented with palpitations and elevated BP, but without chest pain, found to have left ventricular dysfunction with regional wall motion abnormalities in a pattern suggestive of Takotsubo  syndrome versus LAD ischemia.  Troponin increased to 2500   Assessment & Plan    Stop heparin, continue aspirin plus clopidogrel for 12 months, aspirin then indefinitely Started on carvedilol and losartan, blood pressure is borderline low.  Bring back in 2 weeks for heart failure medication titration (add SGLT2 inhibitor and increase losartan at that appointment).  May not have room for transition to [facility] or addition of spironolactone. Atorvastatin dose increased to achieve lower target LDL. We discussed daily weight monitoring, signs and symptoms of heart failure exacerbation, sodium restriction, exercise. Should call office if she gains 3 pounds in 24 hours or 5 pounds in a week or if she ever exceeds 136 pounds on her home scale. Reevaluate LV function in about 3 months, once we are down titrating heart failure meds. [facility] will sign off.   Medication Recommendations: 71 mg daily, clopidogrel 75 mg daily, atorvastatin 40 mg daily, carvedilol 3.125 mg twice daily, losartan 12.5 mg daily Other recommendations (labs, testing, etc): Basic metabolic panel at follow-up appointment Follow up as an outpatient: Will make a follow-up appointment 2 weeks.  For questions or updates, please contact [facility] Please consult [email or web address] for contact info under        Signed, Thurmon Fair, MD  10/19/2022, 12:05 PM

## 2022-10-20 ENCOUNTER — Ambulatory Visit: Payer: Medicare HMO

## 2022-10-20 ENCOUNTER — Encounter (HOSPITAL_COMMUNITY): Payer: Self-pay | Admitting: Cardiology

## 2022-10-20 DIAGNOSIS — R69 Illness, unspecified: Secondary | ICD-10-CM | POA: Diagnosis not present

## 2022-10-20 NOTE — Progress Notes (Signed)
CARDIAC REHAB PHASE I   Post MI education including site care, restrictions, risk factors, heart healthy diet, exercise guidelines, NTG use, MI booklet and CRP2 reviewed. All questions and concerns addressed. Referral for CRP2 sent to [facility]. Will send educational materials to address on file.   Woodroe Chen, RN BSN 10/20/2022 11:07 AM

## 2022-10-21 LAB — LIPOPROTEIN A (LPA): Lipoprotein (a): 104.2 nmol/L — ABNORMAL HIGH (ref <75.0)

## 2022-10-22 ENCOUNTER — Ambulatory Visit: Payer: Medicare HMO

## 2022-10-22 DIAGNOSIS — M1711 Unilateral primary osteoarthritis, right knee: Secondary | ICD-10-CM | POA: Diagnosis not present

## 2022-10-23 DIAGNOSIS — E871 Hypo-osmolality and hyponatremia: Secondary | ICD-10-CM | POA: Diagnosis not present

## 2022-10-23 DIAGNOSIS — D649 Anemia, unspecified: Secondary | ICD-10-CM | POA: Diagnosis not present

## 2022-10-23 DIAGNOSIS — I5181 Takotsubo syndrome: Secondary | ICD-10-CM | POA: Diagnosis not present

## 2022-10-23 DIAGNOSIS — I214 Non-ST elevation (NSTEMI) myocardial infarction: Secondary | ICD-10-CM | POA: Diagnosis not present

## 2022-10-25 NOTE — Progress Notes (Deleted)
Cardiology Office Note:    Date:  10/25/2022   ID:  Beth, Simon [birth year], MRN [id number]  PCP:  Thana Ates, MD   [facility] Providers Cardiologist:  Peter Swaziland, MD { Click to update primary MD,subspecialty MD or APP then REFRESH:1}    Referring MD: No ref. provider found   No chief complaint on file. ***  History of Present Illness:    Beth Simon is a 82 y.o. female with a hx of chronic systolic and diastolic heart failure, CAD, hypertension, hyperlipidemia, breast cancer s/p mastectomy and chemotherapy, uterine cancer s/p hysterectomy with radiation, and GERD.  She had no prior cardiac history prior to admission for fatigue and weakness along with lower extremity swelling.  She was hypertensive on arrival and at bedtime troponin 53 --> 2024.  EKG with RBBB-chronicity unknown.  She was admitted to cardiology and underwent echocardiogram which showed LVEF 30-35% with wall motion abnormality consistent with Takotsubo cardiomyopathy.  She is also had grade 2 DD, moderate LAE, mild MR.  This led to a heart catheterization which showed severe diffuse LAD disease with focal left PDA disease.  Anatomy was not favorable for PCI.  She had normal right heart cath and LV pressures but cardiac output 3.86 and index 2.51.   Differential diagnosis for her event was NSTEMI due to demand ischemia versus a small aborted type I MI versus Takotsubo cardiomyopathy.   At baseline she is quite active and does senior Pilates and runs a walking group with church that walks 2 miles weekly.      CAD Diffuse calcified disease with anatomy not amenable to PCI Focus on risk factor management Aspirin and Plavix x 12 months, then ASA indefinitely - PRN NTG   Chronic systolic heart failure ?  Takotsubo cardiomyopathy versus ischemic cardiomyopathy GDMT carvedilol, 25 mg losartan Given BP, may not have room to transition to entresto ?SGLT2i - no heart failure symptoms - repeat  echo in 3 months   Hyperlipidemia with LDL goal less than 70 10/17/2022: Cholesterol 205; HDL 78; LDL Cholesterol 111; Triglycerides 79; VLDL 16 LPA 104 No on 40 mg lipitor Recheck labs in 6 weeks   Evaluate for readiness for cardiac rehab-placed order under Dr. Elvis Coil name.     Past Medical History:  Diagnosis Date   Adenomatous polyps 01/2016   Anemia    Breast cancer (HCC) 1990   bilateral mastectomy, adenoca breast-left MRM, reconstruction, chemo   Bronchitis last 2 weeks   saw dr Pete Glatter 02-28-2014, he said no antibiotics needed, nonproductive cough   Complication of anesthesia    Cystitis    cytoxen, had once or twice   Family history of breast cancer    History of breast cancer    History of radiation therapy 2/10, 2/12, 2/18, 2/24, 05/29/14   vaginal cuff/ 30 Gy/ 5 fx   Hypertension    Macular degeneration    Neck pain    taking physictal therapy last 2 weeks   Osteoporosis    On Prolia.   PONV (postoperative nausea and vomiting)    Shoulder pain    taking physical therapy for last few weeks   Uterine cancer (HCC) 03/21/2014   MLH1/PMS2 LOH   Uterine fibroid    VAIN I (vaginal intraepithelial neoplasia grade I) 2018   positive HR HPV.   Vasovagal syncopes     Past Surgical History:  Procedure Laterality Date   BREAST IMPLANT REMOVAL  6/94   left breast  BREAST RECONSTRUCTION  1990   left   CATARACT EXTRACTION Bilateral    EXCISION VAGINAL CYST     MASTECTOMY Right 10/08/06   prophylactic   RADICAL MASTECTOMY LND  1990   left, chemo done   RIGHT/LEFT HEART CATH AND CORONARY ANGIOGRAPHY N/A 10/17/2022   Procedure: RIGHT/LEFT HEART CATH AND CORONARY ANGIOGRAPHY;  Surgeon: Marykay Lex, MD;  Location[facility] INVASIVE CV LAB;  Service: Cardiovascular;  Laterality: N/A;   ROBOTIC ASSISTED TOTAL HYSTERECTOMY WITH BILATERAL SALPINGO OOPHERECTOMY Bilateral 03/21/2014   Procedure: ROBOTIC ASSISTED TOTAL HYSTERECTOMY WITH BILATERAL SALPINGO OOPHORECTOMY ;   Surgeon: Laurette Schimke, MD;  Location: [facility] ORS;  Service: Gynecology;  Laterality: Bilateral;    Current Medications: No outpatient medications have been marked as taking for the 11/05/22 encounter (Appointment) with Marcelino Duster, PA.     Allergies:   Celecoxib, Kenalog [triamcinolone], Prednisone, and Procaine   Social History   Socioeconomic History   Marital status: Widowed    Spouse name: Not on file   Number of children: 1   Years of education: Not on file   Highest education level: Not on file  Occupational History   Not on file  Tobacco Use   Smoking status: Never   Smokeless tobacco: Never  Vaping Use   Vaping status: Never Used  Substance and Sexual Activity   Alcohol use: No   Drug use: No   Sexual activity: Not Currently    Partners: Male    Comment: 1st intercourse 41 yo-2 partners  Other Topics Concern   Not on file  Social History Narrative   Not on file   Social Determinants of Health   Financial Resource Strain: Not on file  Food Insecurity: No Food Insecurity (10/17/2022)   Hunger Vital Sign    Worried About Running Out of Food in the Last Year: Never true    Ran Out of Food in the Last Year: Never true  Transportation Needs: No Transportation Needs (10/17/2022)   PRAPARE - Administrator, Civil Service (Medical): No    Lack of Transportation (Non-Medical): No  Physical Activity: Not on file  Stress: Not on file  Social Connections: Not on file     Family History: The patient's ***family history includes Breast cancer in her paternal grandmother; Breast cancer (age of onset: 75) in her sister; Breast cancer (age of onset: 74) in her maternal aunt; Dementia in her maternal aunt and sister; Heart disease in her father and mother; Hypertension in her sister.  ROS:   Please see the history of present illness.    *** All other systems reviewed and are negative.  EKGs/Labs/Other Studies Reviewed:    The following studies were  reviewed today:  Echo 10/16/22:  1. There is akinesis of the distal 2/3 of the left ventricle with apical  ballooning suggestive of possible Tako-Tsubo (stress-induced)  cardiomyopathy. Left ventricular ejection fraction, by estimation, is 30  to 35%. The left ventricle has moderately  decreased function. The left ventricle demonstrates regional wall motion  abnormalities (see scoring diagram/findings for description). Left  ventricular diastolic parameters are consistent with Grade II diastolic  dysfunction (pseudonormalization).   2. Right ventricular systolic function is normal. The right ventricular  size is normal. There is normal pulmonary artery systolic pressure.   3. Left atrial size was mild to moderately dilated.   4. The mitral valve is normal in structure. Mild mitral valve  regurgitation. No evidence of mitral stenosis.   5. The  aortic valve is tricuspid. There is mild calcification of the  aortic valve. Aortic valve regurgitation is trivial. Aortic valve  sclerosis/calcification is present, without any evidence of aortic  stenosis.   6. The inferior vena cava is normal in size with greater than 50%  respiratory variability, suggesting right atrial pressure of 3 mmHg.    Right/Left heart cath  10/17/22:          Recent Labs: 10/17/2022: B Natriuretic Peptide 33.3; Magnesium 2.1; TSH 2.258 10/19/2022: BUN 11; Creatinine, Ser 0.80; Hemoglobin 11.7; Platelets 176; Potassium 3.8; Sodium 131  Recent Lipid Panel    Component Value Date/Time   CHOL 205 (H) 10/17/2022 1216   TRIG 79 10/17/2022 1216   HDL 78 10/17/2022 1216   CHOLHDL 2.6 10/17/2022 1216   VLDL 16 10/17/2022 1216   LDLCALC 111 (H) 10/17/2022 1216     Risk Assessment/Calculations:   {Does this patient have ATRIAL FIBRILLATION?:[phone]}  No BP recorded.  {Refresh Note OR Click here to enter BP  :1}***         Physical Exam:    VS:  LMP 03/31/1988 (Approximate)     Wt Readings from Last 3  Encounters:  10/19/22 132 lb (59.9 kg)  09/04/21 134 lb (60.8 kg)  08/28/20 135 lb (61.2 kg)     GEN: *** Well nourished, well developed in no acute distress HEENT: Normal NECK: No JVD; No carotid bruits LYMPHATICS: No lymphadenopathy CARDIAC: ***RRR, no murmurs, rubs, gallops RESPIRATORY:  Clear to auscultation without rales, wheezing or rhonchi  ABDOMEN: Soft, non-tender, non-distended MUSCULOSKELETAL:  No edema; No deformity  SKIN: Warm and dry NEUROLOGIC:  Alert and oriented x 3 PSYCHIATRIC:  Normal affect   ASSESSMENT:    No diagnosis found. PLAN:    In order of problems listed above:  ***      {Are you ordering a CV Procedure (e.g. stress test, cath, DCCV, TEE, etc)?   Press F2        :[id number]}    Medication Adjustments/Labs and Tests Ordered: Current medicines are reviewed at length with the patient today.  Concerns regarding medicines are outlined above.  No orders of the defined types were placed in this encounter.  No orders of the defined types were placed in this encounter.   There are no Patient Instructions on file for this visit.   Signed, Marcelino Duster, PA  10/25/2022 2:06 PM    [facility]

## 2022-10-27 DIAGNOSIS — R69 Illness, unspecified: Secondary | ICD-10-CM | POA: Diagnosis not present

## 2022-10-28 DIAGNOSIS — M1711 Unilateral primary osteoarthritis, right knee: Secondary | ICD-10-CM | POA: Diagnosis not present

## 2022-10-30 DIAGNOSIS — R69 Illness, unspecified: Secondary | ICD-10-CM | POA: Diagnosis not present

## 2022-10-31 ENCOUNTER — Emergency Department (HOSPITAL_COMMUNITY)
Admission: EM | Admit: 2022-10-31 | Discharge: 2022-10-31 | Disposition: A | Discharge status: Home / Self Care | Payer: Medicare HMO | Attending: Emergency Medicine | Admitting: Emergency Medicine

## 2022-10-31 ENCOUNTER — Other Ambulatory Visit: Payer: Self-pay

## 2022-10-31 DIAGNOSIS — I2510 Atherosclerotic heart disease of native coronary artery without angina pectoris: Secondary | ICD-10-CM | POA: Insufficient documentation

## 2022-10-31 DIAGNOSIS — I110 Hypertensive heart disease with heart failure: Secondary | ICD-10-CM | POA: Diagnosis not present

## 2022-10-31 DIAGNOSIS — R457 State of emotional shock and stress, unspecified: Secondary | ICD-10-CM | POA: Diagnosis not present

## 2022-10-31 DIAGNOSIS — Z79899 Other long term (current) drug therapy: Secondary | ICD-10-CM | POA: Diagnosis not present

## 2022-10-31 DIAGNOSIS — Z7982 Long term (current) use of aspirin: Secondary | ICD-10-CM | POA: Diagnosis not present

## 2022-10-31 DIAGNOSIS — Z7902 Long term (current) use of antithrombotics/antiplatelets: Secondary | ICD-10-CM | POA: Insufficient documentation

## 2022-10-31 DIAGNOSIS — I509 Heart failure, unspecified: Secondary | ICD-10-CM | POA: Diagnosis not present

## 2022-10-31 DIAGNOSIS — I10 Essential (primary) hypertension: Secondary | ICD-10-CM | POA: Diagnosis not present

## 2022-10-31 DIAGNOSIS — Z743 Need for continuous supervision: Secondary | ICD-10-CM | POA: Diagnosis not present

## 2022-10-31 DIAGNOSIS — R519 Headache, unspecified: Secondary | ICD-10-CM | POA: Diagnosis not present

## 2022-10-31 LAB — CBC
HCT: 39.3 % (ref 36.0–46.0)
Hemoglobin: 13.3 g/dL (ref 12.0–15.0)
MCH: 30.5 pg (ref 26.0–34.0)
MCHC: 33.8 g/dL (ref 30.0–36.0)
MCV: 90.1 fL (ref 80.0–100.0)
Platelets: 275 10*3/uL (ref 150–400)
RBC: 4.36 MIL/uL (ref 3.87–5.11)
RDW: 12.8 % (ref 11.5–15.5)
WBC: 10.3 10*3/uL (ref 4.0–10.5)
nRBC: 0 % (ref 0.0–0.2)

## 2022-10-31 LAB — BASIC METABOLIC PANEL
Anion gap: 11 (ref 5–15)
BUN: 17 mg/dL (ref 8–23)
CO2: 19 mmol/L — ABNORMAL LOW (ref 22–32)
Calcium: 9.1 mg/dL (ref 8.9–10.3)
Chloride: 99 mmol/L (ref 98–111)
Creatinine, Ser: 0.79 mg/dL (ref 0.44–1.00)
GFR, Estimated: >60 mL/min (ref >60)
Glucose, Bld: 114 mg/dL — ABNORMAL HIGH (ref 70–99)
Potassium: 4.1 mmol/L (ref 3.5–5.1)
Sodium: 129 mmol/L — ABNORMAL LOW (ref 135–145)

## 2022-10-31 LAB — MAGNESIUM: Magnesium: 2.2 mg/dL (ref 1.7–2.4)

## 2022-10-31 LAB — TROPONIN I (HIGH SENSITIVITY)
Troponin I (High Sensitivity): 78 ng/L — ABNORMAL HIGH (ref <18)
Troponin I (High Sensitivity): 75 ng/L — ABNORMAL HIGH (ref <18)

## 2022-10-31 NOTE — Discharge Instructions (Signed)
You can take 2 tablets of your carvedilol (6.25mg ) twice a day until you see your heart specialist

## 2022-10-31 NOTE — ED Notes (Signed)
Blood drawn and sent to lab. No orders in at this time. Dr. Bebe Shaggy at bedside.

## 2022-10-31 NOTE — ED Provider Notes (Signed)
[place] EMERGENCY DEPARTMENT AT [facility] Provider Note   CSN: [id number] Arrival date & time: 10/31/22  0100     History  Chief Complaint  Patient presents with   Hypertension    Beth Simon is a 82 y.o. female.  The history is provided by the patient.  Patient presents for high blood pressure.  Patient reports recent admission to hospital and underwent cardiac cath.  Patient reports since being discharged she has been compliant with her medications and is felt overall well.  Today she kept checking her blood pressure and every time she checked the BP it continued to escalate.  She has mild headache but no other acute symptoms.  No chest pain or shortness of breath.  No recent dyspnea on exertion.  No orthopnea.     Home Medications Prior to Admission medications   Medication Sig Start Date End Date Taking? Authorizing Provider  aspirin EC 81 MG tablet Take 1 tablet (81 mg total) by mouth daily. Swallow whole. 10/20/22  Yes Briant Cedar, MD  atorvastatin (LIPITOR) 40 MG tablet Take 1 tablet (40 mg total) by mouth daily. 10/20/22 11/19/22 Yes Briant Cedar, MD  carvedilol (COREG) 3.125 MG tablet Take 1 tablet (3.125 mg total) by mouth 2 (two) times daily with a meal. 10/19/22 11/18/22 Yes Briant Cedar, MD  clopidogrel (PLAVIX) 75 MG tablet Take 1 tablet (75 mg total) by mouth daily with breakfast. 10/20/22 11/19/22 Yes Briant Cedar, MD  Coenzyme Q10 (COQ10 PO) Take 1 tablet by mouth every morning.    Yes [provider]  Cyanocobalamin (VITAMIN B 12 PO) Take 1 tablet by mouth every morning.    Yes [provider]  denosumab (PROLIA) 60 MG/ML SOLN injection Inject 60 mg into the skin every 6 (six) months. Administer in upper arm, thigh, or abdomen   Yes [provider]  Flaxseed, Linseed, (FLAX SEEDS PO) Take 1 scoop by mouth every morning. Ground flaxseed in cereal.   Yes [provider]  losartan (COZAAR)  25 MG tablet Take 0.5 tablets (12.5 mg total) by mouth daily. 10/20/22 11/19/22 Yes Briant Cedar, MD  Multiple Vitamins-Minerals (PRESERVISION AREDS) CAPS Take 1 capsule by mouth daily.   Yes [provider]  OVER THE COUNTER MEDICATION Take 1 tablet by mouth every morning. Astazanthin. (anti-oxidant)   Yes [provider]  VITAMIN D, CHOLECALCIFEROL, PO Take 4,000 Int'l Units by mouth every morning.    Yes [provider]      Allergies    Celecoxib, Kenalog [triamcinolone], Prednisone, and Procaine    Review of Systems   Review of Systems  Constitutional:  Negative for fever.  Respiratory:  Negative for shortness of breath.   Cardiovascular:  Negative for chest pain and leg swelling.  Neurological:  Positive for headaches.    Physical Exam Updated Vital Signs BP (!) 153/74   Pulse (!) 58   Temp 97.7 F (36.5 C) (Oral)   Resp 13   Ht 1.486 m (4' 10.5")   Wt 59 kg   LMP 03/31/1988 (Approximate)   SpO2 96%   BMI 26.72 kg/m  Physical Exam CONSTITUTIONAL: Elderly, smiling, no acute distress HEAD: Normocephalic/atraumatic ENMT: Mucous membranes moist NECK: supple no meningeal signs CV: S1/S2 noted, no murmurs/rubs/gallops noted LUNGS: Lungs are clear to auscultation bilaterally, no apparent distress ABDOMEN: soft, nontender NEURO: Pt is awake/alert/appropriate, moves all extremitiesx4.  No facial droop.   EXTREMITIES: pulses normal/equal, full ROM, no calf tenderness,  no lower extremity edema SKIN: warm, color normal PSYCH: no abnormalities of mood noted, alert and oriented to situation  ED Results / Procedures / Treatments   Labs (all labs ordered are listed, but only abnormal results are displayed) Labs Reviewed  BASIC METABOLIC PANEL - Abnormal; Notable for the following components:      Result Value   Sodium 129 (*)    CO2 19 (*)    Glucose, Bld 114 (*)    All other components within normal limits  TROPONIN I (HIGH SENSITIVITY) -  Abnormal; Notable for the following components:   Troponin I (High Sensitivity) 78 (*)    All other components within normal limits  TROPONIN I (HIGH SENSITIVITY) - Abnormal; Notable for the following components:   Troponin I (High Sensitivity) 75 (*)    All other components within normal limits  CBC  MAGNESIUM    EKG EKG Interpretation Date/Time:  Friday October 31 2022 03:07:57 EDT Ventricular Rate:  60 PR Interval:  167 QRS Duration:  152 QT Interval:  537 QTC Calculation: 537 R Axis:   13  Text Interpretation: Sinus rhythm Right bundle branch block Abnormal T, probable ischemia, lateral leads No significant change since last tracing Confirmed by Zadie Rhine ([id number]) on 10/31/2022 3:19:30 AM    Radiology No results found.  Procedures Procedures    Medications Ordered in ED Medications - No data to display  ED Course/ Medical Decision Making/ A&P Clinical Course as of 10/31/22 0714  Fri Oct 31, 2022  0144 Patient with recent admission found to have non-STEMI versus Takotsubo cardiomyopathy.  Patient has significantly abnormal EKG which includes diffuse/deep T wave inversions.  This is a change from prior.  This could also be the natural evolution of her recent cardiac event she is currently chest pain-free and overall well-appearing.  only reason she called 911 was due to elevated blood pressure but she denied any chest pain at that time.  Labs are pending and will consult cardiology [DW]  0234 D/w dr Noemi Chapel with cardiology She suspects that EKG changes could be due to her elevated blood pressure and demand She recommends repeat EKG now that her blood pressures improved She recommends increasing her carvedilol to 6.25 twice daily She does not recommend any other BP med changes, patient has cardiology follow-up on August 7 [DW]  0254 Patient feeling improved, no new chest pain or shortness of breath [DW]  0547 Discussed with patient need to increase her Coreg, and  check her blood pressure at least once a day.  Patient has been walking around, no acute distress.  No chest pain or shortness of breath is reported [DW]    Clinical Course User Index [DW] Zadie Rhine, MD                                 Medical Decision Making Amount and/or Complexity of Data Reviewed Labs: ordered. ECG/medicine tests: ordered.   This patient presents to the ED for concern of elevated blood pressure, this involves an extensive number of treatment options, and is a complaint that carries with it a high risk of complications and morbidity.  The differential diagnosis includes but is not limited to acute coronary syndrome, aortic dissection, pulmonary embolism, pericarditis, pneumothorax, pneumonia, myocarditis, pleurisy, esophageal rupture    Comorbidities that complicate the patient evaluation: Patient's presentation is complicated by their history of CHF, CAD  Additional history obtained: Records reviewed previous admission  documents  Lab Tests: I Ordered, and personally interpreted labs.  The pertinent results include: Chronic hyponatremia   Cardiac Monitoring: The patient was maintained on a cardiac monitor.  I personally viewed and interpreted the cardiac monitor which showed an underlying rhythm of:  sinus rhythm  Consultations Obtained: I requested consultation with the consultant cardiology , and discussed  findings as well as pertinent plan - they recommend: increase carvedilol  Reevaluation: After the interventions noted above, I reevaluated the patient and found that they have :improved  Complexity of problems addressed: Patient's presentation is most consistent with  acute presentation with potential threat to life or bodily function  Disposition: After consideration of the diagnostic results and the patient's response to treatment,  I feel that the patent would benefit from discharge   .           Final Clinical Impression(s) / ED  Diagnoses Final diagnoses:  Primary hypertension    Rx / DC Orders ED Discharge Orders     None         Zadie Rhine, MD 10/31/22 [phone]

## 2022-10-31 NOTE — ED Triage Notes (Signed)
Pt presents via EMS c/o hypertension at home. EMS reports systolic BP as high as 210s. Pt recent dx with NSTEMI per EMS with stent placement.

## 2022-10-31 NOTE — ED Notes (Signed)
Per EMS pt has a left limb restriction

## 2022-10-31 NOTE — ED Notes (Signed)
ED Provider at bedside. 

## 2022-11-02 ENCOUNTER — Emergency Department (HOSPITAL_COMMUNITY)
Admission: EM | Admit: 2022-11-02 | Discharge: 2022-11-02 | Disposition: A | Discharge status: Home / Self Care | Payer: Medicare HMO | Attending: Emergency Medicine | Admitting: Emergency Medicine

## 2022-11-02 ENCOUNTER — Other Ambulatory Visit: Payer: Self-pay

## 2022-11-02 ENCOUNTER — Encounter (HOSPITAL_COMMUNITY): Payer: Self-pay | Admitting: Emergency Medicine

## 2022-11-02 ENCOUNTER — Emergency Department (HOSPITAL_COMMUNITY): Payer: Medicare HMO

## 2022-11-02 DIAGNOSIS — I2510 Atherosclerotic heart disease of native coronary artery without angina pectoris: Secondary | ICD-10-CM | POA: Insufficient documentation

## 2022-11-02 DIAGNOSIS — Z853 Personal history of malignant neoplasm of breast: Secondary | ICD-10-CM | POA: Insufficient documentation

## 2022-11-02 DIAGNOSIS — I10 Essential (primary) hypertension: Secondary | ICD-10-CM | POA: Insufficient documentation

## 2022-11-02 DIAGNOSIS — Z743 Need for continuous supervision: Secondary | ICD-10-CM | POA: Diagnosis not present

## 2022-11-02 DIAGNOSIS — R918 Other nonspecific abnormal finding of lung field: Secondary | ICD-10-CM | POA: Diagnosis not present

## 2022-11-02 DIAGNOSIS — R0789 Other chest pain: Secondary | ICD-10-CM | POA: Diagnosis not present

## 2022-11-02 DIAGNOSIS — I517 Cardiomegaly: Secondary | ICD-10-CM | POA: Diagnosis not present

## 2022-11-02 DIAGNOSIS — I700 Atherosclerosis of aorta: Secondary | ICD-10-CM | POA: Diagnosis not present

## 2022-11-02 DIAGNOSIS — Z8541 Personal history of malignant neoplasm of cervix uteri: Secondary | ICD-10-CM | POA: Insufficient documentation

## 2022-11-02 DIAGNOSIS — R030 Elevated blood-pressure reading, without diagnosis of hypertension: Secondary | ICD-10-CM | POA: Diagnosis not present

## 2022-11-02 DIAGNOSIS — R079 Chest pain, unspecified: Secondary | ICD-10-CM | POA: Diagnosis not present

## 2022-11-02 LAB — TROPONIN I (HIGH SENSITIVITY)
Troponin I (High Sensitivity): 92 ng/L — ABNORMAL HIGH (ref <18)
Troponin I (High Sensitivity): 88 ng/L — ABNORMAL HIGH (ref <18)

## 2022-11-02 LAB — CBC
HCT: 37.6 % (ref 36.0–46.0)
Hemoglobin: 12.8 g/dL (ref 12.0–15.0)
MCH: 29.8 pg (ref 26.0–34.0)
MCHC: 34 g/dL (ref 30.0–36.0)
MCV: 87.6 fL (ref 80.0–100.0)
Platelets: 280 10*3/uL (ref 150–400)
RBC: 4.29 MIL/uL (ref 3.87–5.11)
RDW: 12.7 % (ref 11.5–15.5)
WBC: 11.3 10*3/uL — ABNORMAL HIGH (ref 4.0–10.5)
nRBC: 0 % (ref 0.0–0.2)

## 2022-11-02 LAB — COMPREHENSIVE METABOLIC PANEL
ALT: 18 U/L (ref 0–44)
AST: 20 U/L (ref 15–41)
Albumin: 3.7 g/dL (ref 3.5–5.0)
Alkaline Phosphatase: 39 U/L (ref 38–126)
Anion gap: 11 (ref 5–15)
BUN: 14 mg/dL (ref 8–23)
CO2: 20 mmol/L — ABNORMAL LOW (ref 22–32)
Calcium: 9 mg/dL (ref 8.9–10.3)
Chloride: 97 mmol/L — ABNORMAL LOW (ref 98–111)
Creatinine, Ser: 0.92 mg/dL (ref 0.44–1.00)
GFR, Estimated: >60 mL/min (ref >60)
Glucose, Bld: 123 mg/dL — ABNORMAL HIGH (ref 70–99)
Potassium: 3.9 mmol/L (ref 3.5–5.1)
Sodium: 128 mmol/L — ABNORMAL LOW (ref 135–145)
Total Bilirubin: 0.7 mg/dL (ref 0.3–1.2)
Total Protein: 6.8 g/dL (ref 6.5–8.1)

## 2022-11-02 NOTE — Discharge Instructions (Signed)
You were evaluated in the Emergency Department and after careful evaluation, we did not find any emergent condition requiring admission or further testing in the hospital.  Your exam/testing today is overall reassuring.  Recommend keeping your follow-up with cardiology and discussing your symptoms.  Please return to the Emergency Department if you experience any worsening of your condition.   Thank you for allowing [place] to be a part of your care.

## 2022-11-02 NOTE — ED Triage Notes (Signed)
Patient BIB [facility] from home c/o chest pressure.  Patient reports it started yesterday evening and has not stopped.  EMS gave 324 mg asa.  22 L wrist

## 2022-11-02 NOTE — ED Provider Notes (Signed)
[facility] DEPT [facility] Emergency Department Provider Note MRN:  [id number]  Arrival date & time: 11/02/22     Chief Complaint   Chest Pain   History of Present Illness   Beth Simon is a 82 y.o. year-old female with a history of CAD presenting to the ED with chief complaint of chest pain.  Felt not quite right, checked her blood pressure and it was in the 140s.  Checked it again and it was in the 180s.  Feels like a abnormal feeling in her chest, does not describe it as pain.  Denies shortness of breath, no other complaints.  Review of Systems  A thorough review of systems was obtained and all systems are negative except as noted in the HPI and PMH.   Patient's Health History    Past Medical History:  Diagnosis Date   Adenomatous polyps 01/2016   Anemia    Breast cancer (HCC) 1990   bilateral mastectomy, adenoca breast-left MRM, reconstruction, chemo   Bronchitis last 2 weeks   saw dr Pete Glatter 02-28-2014, he said no antibiotics needed, nonproductive cough   Complication of anesthesia    Cystitis    cytoxen, had once or twice   Family history of breast cancer    History of breast cancer    History of radiation therapy 2/10, 2/12, 2/18, 2/24, 05/29/14   vaginal cuff/ 30 Gy/ 5 fx   Hypertension    Macular degeneration    Neck pain    taking physictal therapy last 2 weeks   Osteoporosis    On Prolia.   PONV (postoperative nausea and vomiting)    Shoulder pain    taking physical therapy for last few weeks   Uterine cancer (HCC) 03/21/2014   MLH1/PMS2 LOH   Uterine fibroid    VAIN I (vaginal intraepithelial neoplasia grade I) 2018   positive HR HPV.   Vasovagal syncopes     Past Surgical History:  Procedure Laterality Date   BREAST IMPLANT REMOVAL  6/94   left breast   BREAST RECONSTRUCTION  1990   left   CATARACT EXTRACTION Bilateral    EXCISION VAGINAL CYST     MASTECTOMY Right 10/08/06   prophylactic   RADICAL MASTECTOMY LND  1990   left,  chemo done   RIGHT/LEFT HEART CATH AND CORONARY ANGIOGRAPHY N/A 10/17/2022   Procedure: RIGHT/LEFT HEART CATH AND CORONARY ANGIOGRAPHY;  Surgeon: Marykay Lex, MD;  Location: [facility] INVASIVE CV LAB;  Service: Cardiovascular;  Laterality: N/A;   ROBOTIC ASSISTED TOTAL HYSTERECTOMY WITH BILATERAL SALPINGO OOPHERECTOMY Bilateral 03/21/2014   Procedure: ROBOTIC ASSISTED TOTAL HYSTERECTOMY WITH BILATERAL SALPINGO OOPHORECTOMY ;  Surgeon: Laurette Schimke, MD;  Location: [facility] ORS;  Service: Gynecology;  Laterality: Bilateral;    Family History  Problem Relation Age of Onset   Breast cancer Sister 49       recurrence age 52   Breast cancer Paternal Grandmother    Heart disease Mother    Heart disease Father    Hypertension Sister    Dementia Sister    Breast cancer Maternal Aunt 49   Dementia Maternal Aunt        dx in her 38s    Social History   Socioeconomic History   Marital status: Widowed    Spouse name: Not on file   Number of children: 1   Years of education: Not on file   Highest education level: Not on file  Occupational History   Not on file  Tobacco Use  Smoking status: Never   Smokeless tobacco: Never  Vaping Use   Vaping status: Never Used  Substance and Sexual Activity   Alcohol use: No   Drug use: No   Sexual activity: Not Currently    Partners: Male    Comment: 1st intercourse 44 yo-2 partners  Other Topics Concern   Not on file  Social History Narrative   Not on file   Social Determinants of Health   Financial Resource Strain: Not on file  Food Insecurity: No Food Insecurity (10/17/2022)   Hunger Vital Sign    Worried About Running Out of Food in the Last Year: Never true    Ran Out of Food in the Last Year: Never true  Transportation Needs: No Transportation Needs (10/17/2022)   PRAPARE - Administrator, Civil Service (Medical): No    Lack of Transportation (Non-Medical): No  Physical Activity: Not on file  Stress: Not on file  Social  Connections: Not on file  Intimate Partner Violence: Not At Risk (10/17/2022)   Humiliation, Afraid, Rape, and Kick questionnaire    Fear of Current or Ex-Partner: No    Emotionally Abused: No    Physically Abused: No    Sexually Abused: No     Physical Exam   Vitals:   11/02/22 0228 11/02/22 0345  BP: (!) 196/77 (!) 156/67  Pulse: 63 60  Resp: 19 11  Temp: 97.9 F (36.6 C)   SpO2: 100% 100%    CONSTITUTIONAL: Well-appearing, NAD NEURO/PSYCH:  Alert and oriented x 3, no focal deficits EYES:  eyes equal and reactive ENT/NECK:  no LAD, no JVD CARDIO: Regular rate, well-perfused, normal S1 and S2 PULM:  CTAB no wheezing or rhonchi GI/GU:  non-distended, non-tender MSK/SPINE:  No gross deformities, no edema SKIN:  no rash, atraumatic   *Additional and/or pertinent findings included in MDM below  Diagnostic and Interventional Summary    EKG Interpretation Date/Time:  November 02, 2022 at 02:17:16 Ventricular Rate:   65 PR Interval:   161 QRS Duration:   156 QT Interval:   529 QTC Calculation:  551 R Axis:      Text Interpretation: Sinus rhythm, deep T wave inversions similar to prior.       Labs Reviewed  CBC - Abnormal; Notable for the following components:      Result Value   WBC 11.3 (*)    All other components within normal limits  COMPREHENSIVE METABOLIC PANEL - Abnormal; Notable for the following components:   Sodium 128 (*)    Chloride 97 (*)    CO2 20 (*)    Glucose, Bld 123 (*)    All other components within normal limits  TROPONIN I (HIGH SENSITIVITY) - Abnormal; Notable for the following components:   Troponin I (High Sensitivity) 92 (*)    All other components within normal limits  TROPONIN I (HIGH SENSITIVITY) - Abnormal; Notable for the following components:   Troponin I (High Sensitivity) 88 (*)    All other components within normal limits    DG Chest 2 View  Final Result      Medications - No data to display   Procedures  /  Critical  Care Procedures  ED Course and Medical Decision Making  Initial Impression and Ddx Patient has had a recent admission for NSTEMI.  Had a recent ED visit 2 days ago for similar presentation, concerned about her high blood pressure.  Reassuring workup.  Seems overall like asymptomatic hypertension driven  by anxiety.  Given her abnormal chest feeling will obtain 2 troponins.  Past medical/surgical history that increases complexity of ED encounter: CAD  Interpretation of Diagnostics I personally reviewed the EKG and my interpretation is as follows: Sinus rhythm with deep T wave inversions, no change from prior  Labs overall reassuring with no significant blood count or electrolyte disturbance.  Troponin mildly elevated and downtrending on repeat.  Patient Reassessment and Ultimate Disposition/Management    Workup overall reassuring.  Doubt ACS.  Given that the troponin is downtrending and blood pressure is improved I see no obvious indication for admission.  She has follow-up with cardiology in 3 days.  Appropriate for discharge.  Patient management required discussion with the following services or consulting groups:  Hospitalist Service  Complexity of Problems Addressed Acute illness or injury that poses threat of life of bodily function  Additional Data Reviewed and Analyzed Further history obtained from: Prior labs/imaging results  Additional Factors Impacting ED Encounter Risk Consideration of hospitalization  Elmer Sow. Pilar Plate, MD [facility] Emergency Medicine [facility] [email or web address]  Final Clinical Impressions(s) / ED Diagnoses     ICD-10-CM   1. Hypertension, unspecified type  I10       ED Discharge Orders     None        Discharge Instructions Discussed with and Provided to Patient:     Discharge Instructions      You were evaluated in the Emergency Department and after careful evaluation, we did not find any emergent condition  requiring admission or further testing in the hospital.  Your exam/testing today is overall reassuring.  Recommend keeping your follow-up with cardiology and discussing your symptoms.  Please return to the Emergency Department if you experience any worsening of your condition.   Thank you for allowing [place] to be a part of your care.       Sabas Sous, MD 11/02/22 [phone]

## 2022-11-03 NOTE — Progress Notes (Unsigned)
Cardiology Office Note:    Date:  11/04/2022   ID:  [name], Beth Simon 1941/01/06, MRN [id number]  PCP:  Beth Ates, MD  Cardiologist:  Peter Swaziland, MD  Electrophysiologist:  None   Referring MD: Beth Ates, MD   Chief Complaint: hospital follow-up of NSTEMI  History of Present Illness:    Beth Simon is a 82 y.o. 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 Swaziland and presents today for hospital follow-up of NSTEMI.  Patient was first seen by Cardiology during recent admission. She was admitted from 10/17/2022 to 10/19/2022 for NSTEMI after presenting with palpitations, uncontrolled BP, and mild chest discomfort. High-sensitivity troponin  53 >> 2,054. Echo showed LVEF of 30-35% with akinesis of the distal 2/3 of the LV with apical ballooning (suggestive of possible stress-induced cardiomyopathy vs LAD ischemia) as well as grade 2 diastolic dysfunction and mild MR. R/LHC showed showed 45% stenosis of proximal to mid LAD at major 1st Diag followed by 3 tandem 70%, 70%, and 85% stenoses of mid LAD with diffuse moderate diease throughout as well as 60% stenosis of the 1st Diag and 90% stenosis of LPDA. Right and left heart pressures were completely normal but cardiac output and index were mildly reduced at 3.86 and 2.51 respectively. Anatomy was not felt to be favorable for PCI; therefore, medical therapy recommended. Differential felt to be NSTEMI due to demand ischemia vs small aborted type 1 myocardial infarction vs Takotsubo syndrome. However, she was treated with IV Heparin for 72 hours and started on DAPT. She had some hypotension during admission which limited GDMT. She was  discharged on Losartan and Coreg with plans to transition to [facility] and possible add Spironolactone as an outpatient if possible.   Since discharge, she has been seen in the ED twice. She was seen in the ED on 10/31/2022 for hypertension which she stated continued to escalate every time she checked it at home. She denied any chest pain or dyspnea. EKG did show new diffuse T wave inversions. However, high-sensitivity troponin minimally elevated and flat at 78 >> 75. This was felt to possibly be due to recent cardiac event and elevated BP. Coreg was increased and she was discharge. She was seen back in the ED again on 11/02/2022 for hypertension and chest discomfort. She stated at that time that she just did not feel quite right. She checked her BP multiple times and systolic BP got as high as the 180s. She reported an abnormal feeling in her chest but no real pain. BP  was initially 196/77 and then improved to 156/67. EKG showed persistent deep T wave inversions but no acute changes from prior. Highs-sensitivity troponin 92 >> 88. Hypertension was fel to likely be driven by anxiety. She was again felt to be safe for discharge given close follow-up with Cardiology.  She presents today for follow-up. Here alone. She denies any chest pain; however, she describes a feeling of "fullness" / fluttering in her chest that has been fairly constant since discharge. She states she was having this sensation during both ED visit and I see no mention of any arrhythmias. She states this feels different than the symptoms she had prior to her  hospitalization. No shortness of breath, orthopnea, PND. She states her edema in her legs as gone down and she has lost about 3-5 lbs. Other than the vague flutter/ fullness sensation that she describes above, no palpitations. No lightheadedness, dizziness, or syncope.   After discharge from the hospital, she started checking her BP every night. She  brings in this log of her home BP for me  to review. Initial BP after leaving the hospital was soft at times with systolic BP in the high 90s to 110s. However, when she went to check her BP on the evening of 8/1, it was 214/100 which is why she went to the ED. This is when she was advised to increase her Coreg to 6.25mg  twice daily. However, her BP spiked again to 230/100 on the evening of 8/3 which is when she went back to the ED. BP yesterday was 128/83. BP initially 140/62 in the office today and then 160/70 on my personal recheck at the end of visit. Heart rates always in the 50s to low 60s.   EKGs/Labs/Other Studies Reviewed:    The following studies were reviewed:  Echocardiogram 10/17/2022: Impressions: 1. There is akinesis of the distal 2/3 of the left ventricle with apical  ballooning suggestive of possible Tako-Tsubo (stress-induced)  cardiomyopathy. Left ventricular ejection fraction, by estimation, is 30  to 35%. The left ventricle has moderately  decreased function. The left ventricle demonstrates regional wall motion  abnormalities (see scoring diagram/findings for description). Left  ventricular diastolic parameters are consistent with Grade II diastolic  dysfunction (pseudonormalization).   2. Right ventricular systolic function is normal. The right ventricular  size is normal. There is normal pulmonary artery systolic pressure.   3. Left atrial size was mild to moderately dilated.   4. The mitral valve is normal in structure. Mild mitral valve  regurgitation. No evidence of mitral stenosis.   5. The aortic valve is tricuspid. There is mild calcification of the  aortic valve. Aortic valve regurgitation is trivial. Aortic valve  sclerosis/calcification is present, without any evidence of aortic  stenosis.   6. The inferior vena cava is normal in size with greater than 50%  respiratory variability, suggesting right atrial pressure of 3 mmHg.  _______________  Right/ Left Cardiac Catheterization 10/17/2022:   Prox  LAD to Mid LAD lesion is 45% stenosed at major 1st Diag followed by Mid LAD-1 lesion is 70% stenosed- just after 1st Sept (with ~ berry aneurysmal segment.  Not favorable for PCI.   Mid LAD-2 lesion is 70% stenosed.  Mid LAD-3 lesion is 85% stenosed. With difuse moderate disease throughout.   1st Diag lesion is 60% stenosed.   LPDA lesion is 90% stenosed.   LV end diastolic pressure is normal.   There is no aortic valve stenosis.   POST-CATH DIAGNOSES Severe diffuse LAD disease and focal left PDA disease of the left dominant system and separate ostia of LAD and dominant LCx. LAD has multiple sequential lesions ranging from 40% to 85-90% with 1 area involving branch points of major diagonal branch and septal perforator not very favorable for PCI especially given extent of calcification. Focal PDA 90% stenosis just prior to a very tortuous area.  (PCI amenable, but question of usefulness with the extent of disease in LAD). Well compensated CHF with completely normal Right Heart Cath and LV Pressures, but notably reduced Cardiac Output and Index (3.86, 2.51) PAP-mean 21/11-16 million mercury, PCWP 6 mm, LV P-EDP 128/2-11 mmHg with a AO P-MAP 124/60-86  mmHg. Ao sat 94%, PA sat 64%.   RECOMMENDATIONS In the absence of any other complications or medical issues, we expect the patient to be ready for discharge from a cath perspective. Would optimize medical therapy for CAD and cardiomyopathy.  Appears to be well compensated. If she has worsening dyspnea on exertion or anginal equivalent type symptoms, could consider reevaluating PCI options, but for now would plan to treat medically.  Stabilized medically prior to discharge. Reinitiate IV heparin 2 hours after TR band removal and run for total of 48 hours. Based on likely ACS presentation and existing disease, would recommend at least 3 to 6 months of DAPT followed by completion of 1 year Plavix monotherapy. Will DC lisinopril with plans to potentially  convert to ARB plus or minus Entresto.  Diagnostic Dominance: Right      EKG:  EKG not ordered today.  Recent Labs: 10/17/2022: B Natriuretic Peptide 33.3; TSH 2.258 10/31/2022: Magnesium 2.2 11/02/2022: ALT 18; Hemoglobin 12.8; Platelets 280 11/04/2022: BUN 13; Creatinine, Ser 0.74; Potassium 5.0; Sodium 133  Recent Lipid Panel    Component Value Date/Time   CHOL 205 (H) 10/17/2022 1216   TRIG 79 10/17/2022 1216   HDL 78 10/17/2022 1216   CHOLHDL 2.6 10/17/2022 1216   VLDL 16 10/17/2022 1216   LDLCALC 111 (H) 10/17/2022 1216    Physical Exam:    Vital Signs: BP (!) 160/70   Pulse 61   Ht 4' 10.5" (1.486 m)   Wt 127 lb (57.6 kg)   LMP 03/31/1988 (Approximate)   SpO2 95%   BMI 26.09 kg/m     Wt Readings from Last 3 Encounters:  11/04/22 127 lb (57.6 kg)  11/02/22 127 lb (57.6 kg)  10/31/22 130 lb 1.1 oz (59 kg)     General: 82 y.o. Caucasian female in no acute distress. HEENT: Normocephalic and atraumatic. Sclera clear. EOMs intact. Neck: Supple. No JVD. Heart: RRR. Distinct S1 and S2. No murmurs, gallops, or rubs. Radial and distal pedal pulses 2+ and equal bilaterally. Lungs: No increased work of breathing. Clear to ausculation bilaterally. No wheezes, rhonchi, or rales.  Abdomen: Soft, non-distended, and non-tender to palpation.  Extremities: No lower extremity edema.    Skin: Warm and dry. Neuro: Alert and oriented x3. No focal deficits. Psych: Normal affect. Responds appropriately.  Assessment:    1. Coronary artery disease involving native coronary artery of native heart without angina pectoris   2. History of non-ST elevation myocardial infarction (NSTEMI)   3. Chronic combined systolic and diastolic CHF (congestive heart failure) (HCC)   4. Primary hypertension   5. Hyperlipidemia, unspecified hyperlipidemia type   6. Hyponatremia     Plan:    CAD with Recent NSTEMI Recently admitted with NSTEMI. High-sensitivity troponin peaked in the 2,000s. LHC  showed severe diffuse LAD disease and focal left PDA disease. Anatomy not favorable for PCI so medical therapy recommended. Differential felt to be NSTEMI due to demand ischemia vs small aborted type 1 myocardial infarction vs Takotsubo syndrome. However, she was treated with IV Heparin for 72 hours and started on DAPT.  - She describes a vague fullness/ flutter sensation that she has been constant  since discharge. However, no real chest pain. She states this feels different than the symptoms she had prior to recent admission.  - Continue DAPT with Aspirin and Plavix. Plan to continue this for 12 months after MI and then Aspirin alone. - Continue beta-blocker and statin. - Recommend Cardiac Rehab. - No additional  work-up necessary at this time. Does not sound like angina.   Chronic Combined CHF Echo during recent admission LVEF of 30-35% with akinesis of the distal 2/3 of the LV with apical ballooning (suggestive of possible stress-induced cardiomyopathy vs LAD ischemia) as well as grade 2 diastolic dysfunction and mild MR. LHC showed severe diffuse LAD disease as above. Right and left heart pressures were completely normal but cardiac output and index were mildly reduced at 3.86 and 2.51 respectively. GDMT was limited by hypotension. However, she has since had 2 ED visit with elevated BP.  - Euvolemic on exam.  - Continue Losartan 25mg  daily.  - Continue Coreg 6.25mg  twice daily.  - Will start Farxiga 10mg  daily. - Would like to ultimately had Spironolactone; however, she has labile BP as described in HPI. Will wait for home BP/ HR log and then will likely add Spironolactone and/ or switch from Losartan to Entresto. - Will plan to repeat Echo in about 3 months after optimization of GDMT. Can order at follow-up visit.   Labile Hypertension BP mildly elevated in the office today. Initially 140/62 and then 160/70 on my personal recheck at the end of visit. Her BP was initially soft with systolic BP  ranging from the high 90s to 110s after leaving the hospital. However, her BP has been substantially higher the last few days with BP spiking to the 200s/100s twice.  - Continue Coreg 6.25 mg twice daily. Unable to up-titrate this due to low resting heart rates.  - After visit, realized she was only taking Losartan 12.5mg  daily (though she was on 25mg  daily). Will advise her to go ahead and increase Losartan to 25mg  daily when we call her with labs results tomorrow.  - Will prescribed PRN Hydralazine for patient to take if BP >160/100 (can take this up to twice a day).  - Have asked patient to check her BP twice a day (AM and PM) for the next week and then send this to [place]. I am hoping to add Spironolactone or switch to [facility] However, want to make sure she is still not having low BP readings and will tolerate this OK given she is 82 years old.  - Will repeat BMET in 1 week after increasing Losartan and adding [place].   Hyperlipidemia Lipid panel on 10/17/2022 during recent admission: Total Cholesterol 205, Triglycerides 79, HDL 78, LDL 111. LDL goal <55 under new guidelines.  - Lipitor was increased from 10mg  to 40mg  daily during recent admission. Continue.  - Will repeat lipid panel and LFTs in 6-8 weeks. Will order at next visit.   Hyponatremia Sodium 128 at most recent ED visit. Patient says she has a history of hyponatremia.  - Will repeat BMET today to make sure this is stable.  Disposition: Follow up in 1 month.    Medication Adjustments/Labs and Tests Ordered: Current medicines are reviewed at length with the patient today.  Concerns regarding medicines are outlined above.  Orders Placed This Encounter  Procedures   Basic metabolic panel   AMB referral to cardiac rehabilitation   EKG 12-Lead   Meds ordered this encounter  Medications   dapagliflozin propanediol (FARXIGA) 10 MG TABS tablet    Sig: Take 1 tablet (10 mg total) by mouth daily before breakfast.    Dispense:  30  tablet    Refill:  1   DISCONTD: hydrALAZINE (APRESOLINE) 25 MG tablet    Sig: Take 1 tablet (25 mg total) by mouth 3 (three) times daily.  Dispense:  270 tablet    Refill:  3    Patient Instructions  Medication Instructions:  BEGIN FARXIGA 10MG  ONCE DAILY BEGIN HYDRALAZINE 25MG  AS NEEDED FOR BLOOD PRESSURE OVER 160/100. YOU MAKE TAKE THIS TWICE DAILY. CHECK YOU BLOOD PRESSURE IN THE MORNING AND AT NIGHT *If you need a refill on your cardiac medications before your next appointment, please call your pharmacy*   Lab Work: BMET If you have labs (blood work) drawn today and your tests are completely normal, you will receive your results only by: MyChart Message (if you have MyChart) OR A paper copy in the mail If you have any lab test that is abnormal or we need to change your treatment, we will call you to review the results.   Testing/Procedures: NONE   Follow-Up: At [facility], you and your health needs are our priority.  As part of our continuing mission to provide you with exceptional heart care, we have created designated Provider Care Teams.  These Care Teams include your primary Cardiologist (physician) and Advanced Practice Providers (APPs -  Physician Assistants and Nurse Practitioners) who all work together to provide you with the care you need, when you need it.  We recommend signing up for the patient portal called "MyChart".  Sign up information is provided on this After Visit Summary.  MyChart is used to connect with patients for Virtual Visits (Telemedicine).  Patients are able to view lab/test results, encounter notes, upcoming appointments, etc.  Non-urgent messages can be sent to your provider as well.   To learn more about what you can do with MyChart, go to [email or web address].    Your next appointment:   1 month(s)  Provider:   Marjie Skiff, PA-C     {If Card or EP not listed click to update       Signed, Corrin Parker, PA-C   11/04/2022 8:17 PM    [facility]

## 2022-11-04 ENCOUNTER — Telehealth: Payer: Self-pay | Admitting: Student

## 2022-11-04 ENCOUNTER — Ambulatory Visit: Payer: Medicare HMO | Attending: Physician Assistant | Admitting: Student

## 2022-11-04 ENCOUNTER — Encounter: Payer: Self-pay | Admitting: Student

## 2022-11-04 VITALS — BP 160/70 | HR 61 | Ht 58.5 in | Wt 127.0 lb

## 2022-11-04 DIAGNOSIS — I10 Essential (primary) hypertension: Secondary | ICD-10-CM

## 2022-11-04 DIAGNOSIS — E785 Hyperlipidemia, unspecified: Secondary | ICD-10-CM | POA: Diagnosis not present

## 2022-11-04 DIAGNOSIS — I2510 Atherosclerotic heart disease of native coronary artery without angina pectoris: Secondary | ICD-10-CM

## 2022-11-04 DIAGNOSIS — I252 Old myocardial infarction: Secondary | ICD-10-CM

## 2022-11-04 DIAGNOSIS — E871 Hypo-osmolality and hyponatremia: Secondary | ICD-10-CM

## 2022-11-04 DIAGNOSIS — I5042 Chronic combined systolic (congestive) and diastolic (congestive) heart failure: Secondary | ICD-10-CM | POA: Diagnosis not present

## 2022-11-04 LAB — BASIC METABOLIC PANEL
BUN/Creatinine Ratio: 18 (ref 12–28)
BUN: 13 mg/dL (ref 8–27)
CO2: 22 mmol/L (ref 20–29)
Calcium: 9.5 mg/dL (ref 8.7–10.3)
Chloride: 98 mmol/L (ref 96–106)
Creatinine, Ser: 0.74 mg/dL (ref 0.57–1.00)
Glucose: 91 mg/dL (ref 70–99)
Potassium: 5 mmol/L (ref 3.5–5.2)
Sodium: 133 mmol/L — ABNORMAL LOW (ref 134–144)
eGFR: 80 mL/min/{1.73_m2} (ref >59)

## 2022-11-04 MED ORDER — ASPIRIN 81 MG PO TBEC: 81.0000 mg | DELAYED_RELEASE_TABLET | Freq: Every day | ORAL | 0 refills | Status: DC

## 2022-11-04 MED ORDER — CARVEDILOL 6.25 MG PO TABS: 6.2500 mg | ORAL_TABLET | Freq: Two times a day (BID) | ORAL | 3 refills | Status: DC

## 2022-11-04 MED ORDER — LOSARTAN POTASSIUM 25 MG PO TABS: 12.5000 mg | ORAL_TABLET | Freq: Every day | ORAL | 0 refills | Status: DC

## 2022-11-04 MED ORDER — CLOPIDOGREL BISULFATE 75 MG PO TABS: 75.0000 mg | ORAL_TABLET | Freq: Every day | ORAL | 0 refills | Status: DC

## 2022-11-04 MED ORDER — ATORVASTATIN CALCIUM 40 MG PO TABS: 40.0000 mg | ORAL_TABLET | Freq: Every day | ORAL | 0 refills | Status: DC

## 2022-11-04 MED ORDER — HYDRALAZINE HCL 25 MG PO TABS: 25.0000 mg | ORAL_TABLET | Freq: Three times a day (TID) | ORAL | 3 refills | Status: DC

## 2022-11-04 MED ORDER — DAPAGLIFLOZIN PROPANEDIOL 10 MG PO TABS: 10.0000 mg | ORAL_TABLET | Freq: Every day | ORAL | 1 refills | Status: DC

## 2022-11-04 MED ORDER — HYDRALAZINE HCL 25 MG PO TABS: 25.0000 mg | ORAL_TABLET | ORAL | 3 refills | Status: DC | PRN

## 2022-11-04 NOTE — Telephone Encounter (Signed)
  Pt c/o medication issue:  1. Name of Medication:   carvedilol (COREG) 3.125 MG tablet    2. How are you currently taking this medication (dosage and times per day)?   Take 1 tablet (3.125 mg total) by mouth 2 (two) times daily with a meal.    3. Are you having a reaction (difficulty breathing--STAT)? No   4. What is your medication issue? A patient called to report that her dosage for a medication was not updated on her After Visit Summary (AVS). She stated that when she was in the Emergency Department (ED), the dosage was changed to take Carvedilol: 2 pills in the morning and 2 pills in the evening.

## 2022-11-04 NOTE — Patient Instructions (Addendum)
Medication Instructions:  BEGIN FARXIGA 10MG  ONCE DAILY BEGIN HYDRALAZINE 25MG  AS NEEDED FOR BLOOD PRESSURE OVER 160/100. YOU MAKE TAKE THIS TWICE DAILY. CHECK YOU BLOOD PRESSURE IN THE MORNING AND AT NIGHT *If you need a refill on your cardiac medications before your next appointment, please call your pharmacy*   Lab Work: BMET If you have labs (blood work) drawn today and your tests are completely normal, you will receive your results only by: MyChart Message (if you have MyChart) OR A paper copy in the mail If you have any lab test that is abnormal or we need to change your treatment, we will call you to review the results.   Testing/Procedures: NONE   Follow-Up: At [facility], you and your health needs are our priority.  As part of our continuing mission to provide you with exceptional heart care, we have created designated Provider Care Teams.  These Care Teams include your primary Cardiologist (physician) and Advanced Practice Providers (APPs -  Physician Assistants and Nurse Practitioners) who all work together to provide you with the care you need, when you need it.  We recommend signing up for the patient portal called "MyChart".  Sign up information is provided on this After Visit Summary.  MyChart is used to connect with patients for Virtual Visits (Telemedicine).  Patients are able to view lab/test results, encounter notes, upcoming appointments, etc.  Non-urgent messages can be sent to your provider as well.   To learn more about what you can do with MyChart, go to [email or web address].    Your next appointment:   1 month(s)  Provider:   Marjie Skiff, PA-C     {If Card or EP not listed click to update

## 2022-11-04 NOTE — Telephone Encounter (Signed)
Called pt she states she is taking Coreg 6.25 mg twice daily. Reached out to [employer] to to verify. Pt made away her medication list has been updated. Pt will be seen in one month, she was only given 1 month supply at the hospital. Sent in one months supply of her cardiovascular medications. Pt will request a 90 day supply at her next office visit if medications stay the same. Pt states she would like Callie to discuss the sudden blood pressure spikes with Dr. Swaziland.

## 2022-11-05 ENCOUNTER — Telehealth: Payer: Self-pay | Admitting: Student

## 2022-11-05 ENCOUNTER — Ambulatory Visit: Payer: Medicare HMO | Admitting: Physician Assistant

## 2022-11-05 NOTE — Addendum Note (Signed)
Addended by: Shon Hale on: 11/05/2022 07:48 AM   Modules accepted: Orders

## 2022-11-05 NOTE — Telephone Encounter (Signed)
Thanks so much! I routed my note to Dr. Swaziland to keep him up to speed.

## 2022-11-05 NOTE — Telephone Encounter (Signed)
Paged to call [name] regarding elevated BP.  Reached her at her home phone and verified name and DOB.  Recent medical history reviewed, including hospitalization and cardiac catheterization with obstructive coronary disease not amenable to PCI and reduced LVEF consistent with Takotsubo cardiomyopathy versus LAD lesion.  2 recent ED visits for asymptomatic hypertension.  Seen in clinic yesterday for blood pressure management.  She stated that she had a normal day yesterday.  SBP on multiple different checks in the 140s-160s and asymptomatic.  She checked her blood pressure before going to bed around 10 PM and reports SBP 156.  Has as needed hydralazine for SBP > and she decided to take hydralazine 25 mg (this was her second dose for the day).  She woke up around 2 AM and took her blood pressure with SBP 179 mmHg (she does not remember her diastolic blood pressure).  She tells me that she feels quite anxious about her fluctuating/elevated BP.  She denies any new chest discomfort or dyspnea in the last 24 hours and in the last couple of weeks.  She states that she believes that her elevated BP is related to anxiety and almost always occurs in the evening.  She feels like she works herself up prior to taking her blood pressure.  Also noted that she had great blood pressure control until about 1.5-2 weeks ago with no change in her antihypertensive regimen.  I suggested that she could have her home BP cuff calibrated with BP cuff in clinic in case her home BP cuff is faulty.  I stated that I felt comfortable giving reassurance that she did not need to immediately present to the emergency room for asymptomatic hypertension.  I instructed her that should she develop new onset headache, blurry vision, chest discomfort, shortness of breath with elevated BP, in particular SBP > 180 mmHg, to present to the nearest emergency room for further evaluation.  Since she has already taken 50 total mg of hydralazine in the last  ~6 hours, I instructed no additional anti-HTN meds and to take AM meds as currently prescribed.  She was in agreement with this plan.  Beth Amsler, MD

## 2022-11-06 ENCOUNTER — Other Ambulatory Visit: Payer: Self-pay

## 2022-11-06 ENCOUNTER — Ambulatory Visit: Payer: Medicare HMO

## 2022-11-06 DIAGNOSIS — I2510 Atherosclerotic heart disease of native coronary artery without angina pectoris: Secondary | ICD-10-CM

## 2022-11-06 DIAGNOSIS — E785 Hyperlipidemia, unspecified: Secondary | ICD-10-CM

## 2022-11-06 DIAGNOSIS — I10 Essential (primary) hypertension: Secondary | ICD-10-CM

## 2022-11-06 DIAGNOSIS — E871 Hypo-osmolality and hyponatremia: Secondary | ICD-10-CM

## 2022-11-06 MED ORDER — LOSARTAN POTASSIUM 25 MG PO TABS: 25.0000 mg | ORAL_TABLET | Freq: Every day | ORAL | Status: DC

## 2022-11-06 NOTE — Telephone Encounter (Signed)
Thank you! We have also increased her Losartan slightly. I suspect anxiety is playing a large role in these spikes. She has been instructed to monitor her BP for 1 week and to only check her BP twice a day (once in the morning and once in the evening) unless she is having symptoms.   ~ [place]

## 2022-11-07 ENCOUNTER — Encounter (HOSPITAL_COMMUNITY): Payer: Self-pay

## 2022-11-10 ENCOUNTER — Telehealth: Payer: Self-pay | Admitting: Student

## 2022-11-10 ENCOUNTER — Ambulatory Visit: Payer: Medicare HMO | Attending: Obstetrics and Gynecology

## 2022-11-10 DIAGNOSIS — I890 Lymphedema, not elsewhere classified: Secondary | ICD-10-CM | POA: Insufficient documentation

## 2022-11-10 DIAGNOSIS — Z8542 Personal history of malignant neoplasm of other parts of uterus: Secondary | ICD-10-CM | POA: Insufficient documentation

## 2022-11-10 MED ORDER — LOSARTAN POTASSIUM 25 MG PO TABS: 25.0000 mg | ORAL_TABLET | Freq: Every day | ORAL | 3 refills | Status: DC

## 2022-11-10 NOTE — Telephone Encounter (Signed)
Called pt and let her know that Dr. Swaziland confirmed the dosage and how often to take.

## 2022-11-10 NOTE — Telephone Encounter (Signed)
Returned call to pt in regards to her losartan. Verified that she is to take the medication 1 tablet once a day. Order sent to pharmacy as pt states they on have the order that says 1/2.

## 2022-11-10 NOTE — Telephone Encounter (Signed)
  Pt c/o medication issue:  1. Name of Medication:   losartan (COZAAR) 25 MG tablet    2. How are you currently taking this medication (dosage and times per day)?   Take 1 tablet (25 mg total) by mouth daily.    3. Are you having a reaction (difficulty breathing--STAT)? No   4. What is your medication issue? Pt would like to verify her dosage for this medication

## 2022-11-10 NOTE — Therapy (Signed)
OUTPATIENT PHYSICAL THERAPY  LOWER EXTREMITY ONCOLOGY TREATMENT  Patient Name: Beth Simon MRN: [id number] DOB:1940-07-01, 82 y.o., female Today's Date: 11/10/2022  END OF SESSION:  PT End of Session - 11/10/22 1403     Visit Number 8    Number of Visits 12    Date for PT Re-Evaluation 12/22/22    PT Start Time 1405    PT Stop Time 1452    PT Time Calculation (min) 47 min    Activity Tolerance Patient tolerated treatment well    Behavior During Therapy [facility] for tasks assessed/performed             Past Medical History:  Diagnosis Date   Adenomatous polyps 01/2016   Anemia    Breast cancer (HCC) 1990   bilateral mastectomy, adenoca breast-left MRM, reconstruction, chemo   Bronchitis last 2 weeks   saw dr Pete Glatter 02-28-2014, he said no antibiotics needed, nonproductive cough   Complication of anesthesia    Cystitis    cytoxen, had once or twice   Family history of breast cancer    History of breast cancer    History of radiation therapy 2/10, 2/12, 2/18, 2/24, 05/29/14   vaginal cuff/ 30 Gy/ 5 fx   Hypertension    Macular degeneration    Neck pain    taking physictal therapy last 2 weeks   Osteoporosis    On Prolia.   PONV (postoperative nausea and vomiting)    Shoulder pain    taking physical therapy for last few weeks   Uterine cancer (HCC) 03/21/2014   MLH1/PMS2 LOH   Uterine fibroid    VAIN I (vaginal intraepithelial neoplasia grade I) 2018   positive HR HPV.   Vasovagal syncopes    Past Surgical History:  Procedure Laterality Date   BREAST IMPLANT REMOVAL  6/94   left breast   BREAST RECONSTRUCTION  1990   left   CATARACT EXTRACTION Bilateral    EXCISION VAGINAL CYST     MASTECTOMY Right 10/08/06   prophylactic   RADICAL MASTECTOMY LND  1990   left, chemo done   RIGHT/LEFT HEART CATH AND CORONARY ANGIOGRAPHY N/A 10/17/2022   Procedure: RIGHT/LEFT HEART CATH AND CORONARY ANGIOGRAPHY;  Surgeon: Marykay Lex, MD;  Location: [facility] INVASIVE CV LAB;   Service: Cardiovascular;  Laterality: N/A;   ROBOTIC ASSISTED TOTAL HYSTERECTOMY WITH BILATERAL SALPINGO OOPHERECTOMY Bilateral 03/21/2014   Procedure: ROBOTIC ASSISTED TOTAL HYSTERECTOMY WITH BILATERAL SALPINGO OOPHORECTOMY ;  Surgeon: Laurette Schimke, MD;  Location: [facility] ORS;  Service: Gynecology;  Laterality: Bilateral;   Patient Active Problem List   Diagnosis Date Noted   Chronic combined systolic and diastolic CHF (congestive heart failure) (HCC) 11/04/2022   History of non-ST elevation myocardial infarction (NSTEMI) 10/17/2022   Palpitations 10/17/2022   Hyperlipidemia 10/17/2022   Hyponatremia 10/17/2022   Fatigue 10/17/2022   History of breast cancer 10/17/2022   Age-related osteoporosis without current pathological fracture 03/19/2020   Personal history of colonic polyps 03/19/2020   Transient cerebral ischemia 09/15/2017   Pure hypercholesterolemia 09/15/2017   Pulmonary nodule 09/15/2017   Essential hypertension 09/15/2017   Gastro-esophageal reflux disease without esophagitis 09/15/2017   VAIN I (vaginal intraepithelial neoplasia grade I) 06/23/2017   History of uterine cancer    Family history of breast cancer    Endometrial cancer (HCC) 03/21/2014   Uterine cancer (HCC) 03/21/2014   S/P mastectomy, bilateral 05/02/2013   Situational stress 05/02/2013   Osteoporosis, unspecified 05/02/2013   Breast cancer, left breast (HCC)  05/02/2013    PCP: Merlene Laughter, MD  REFERRING PROVIDER: Wyvonnia Lora MD  REFERRING DIAG: LE Lymphedema  THERAPY DIAG:  Lymphedema, not elsewhere classified  History of uterine cancer  ONSET DATE: around 05/2021  Rationale for Evaluation and Treatment: Rehabilitation  SUBJECTIVE:                                                                                                                                                                                           SUBJECTIVE STATEMENT:  11/10/2022 Pt has a hx of CAD and had a NSTEMI on  10/08/22.  HER EF is 30-35%.She will start cardiac rehab on 11/13/2022. I have lost 7 lbs because I have no taste for food. I haven't done any MLD since I was here last. My BP has been high and low. Trying to get Meds stable to control it.  09/22/2022 EVAL Pt returns due to swelling in her legs, left greater than right. It comes and goes.. This morning it  seems to be down. The socks we ordered online were really good, but hot,but I ordered some online at [facility] that were thinner. When I was in [place] we did a great deal of walking. I have done my MLD occasionally and do both legs but I feel I need a refresher.. She had an injection in her right knee last Tuesday with Kenalog and she had some strange side effects; it made me feel weak in my arms and legs and caused some tingling in my arms and legs. The sensation is still there but not as severe. I have been inactive today so my legs look pretty good. Since the injection last week and feeling funny I have not done much. (Pt advised to follow up with MD about symptoms she is still having) She will be starting PT at [facility] for Right knee pain.  PERTINENT HISTORY:  History notable for left breast cancer ER-PR-  treated with radical mastectomy and chemotherapy in 1990.  Subsequently underwent right simple mastectomy 2008 without reconstruction. Denies use of tamoxifen  On 03/21/14  she underwent a robotic hysterectomy for treatment of uterine cancer, BSO. Frozen section revealed grade 1 cancer. Final pathology revealed a grade 2 lesion with LVSI present and she was staged at stage IA. She was recommended vaginal brachytherapy as adjuvant therapy to reduce risk for local recurrence. Dr Karoline Caldwell delivered vaginal brachytherapy completed on 05/29/14. Her treatments were without complication. Pt had therapy from 11/12/21 to 01/08/22 for bilateral LE swelling. PAIN:  Are you having pain? No  PRECAUTIONS: Bilateral LE lymphedema,s/p Left breast cancer, Recent MI  (10/17/2022), now starting cardiac rehab  next week  WEIGHT BEARING RESTRICTIONS: No  FALLS:  Has patient fallen in last 6 months? No  LIVING ENVIRONMENT: Lives with: lives alone Lives in: House/apartment Stairs: No;  Has following equipment at home: None  OCCUPATION: Retired  LEISURE: walks, Pilates with Roanna Raider  PRIOR LEVEL OF FUNCTION: Independent  PATIENT GOALS: Review self MLD,check present compression garments and purchase new prn,    OBJECTIVE:  COGNITION: Overall cognitive status: Within functional limits for tasks assessed   PALPATION: Tenderness left lower leg with light palpation, not so on right. Very mild pitting bilaterally mostly foot/ankle  OBSERVATIONS / OTHER ASSESSMENTS: multiple varicosities noted bilateral LE's  SENSATION: Light touch: Appears intact  POSTURE: Forward head rounded shoulders  LYMPHEDEMA ASSESSMENTS:   SURGERY TYPE/DATE: uterine cancer with total hysterectomy in 2016, 1990 L mastectomy and ALND, 2008 R mastectomy   NUMBER OF LYMPH NODES REMOVED: 0  CHEMOTHERAPY: completed for L breast cancer   RADIATION:completed  HORMONE TREATMENT: NONE  INFECTIONS: NONE  LYMPHEDEMA ASSESSMENTS:   LOWER EXTREMITY LANDMARK RIGHT eval RIGHT 10/09/2022 RIGHT 10/15/2022 RIGHT 11/10/2022  At groin      30 cm proximal to suprapatella      20 cm proximal to suprapatella      10 cm proximal to suprapatella      At midpatella / popliteal crease      30 cm proximal to floor at lateral plantar foot 36 36 36.2 35.7  20 cm proximal to floor at lateral plantar foot 28.2 28.3 28..5 28.3  10 cm proximal to floor at lateral plantar foot 22.0 22.1 22.2 22.1  Circumference of ankle/heel      5 cm proximal to 1st MTP joint 20.3     Across MTP joint 20.5 20.3 20.3 20.2  Around proximal great toe 7 7 7.0 7.1  (Blank rows = not tested)  LOWER EXTREMITY LANDMARK LEFT eval LEFT 10/09/2022 LEFT 10/15/2022 LEFT 11/10/2022  At groin      30 cm  proximal to suprapatella      20 cm proximal to suprapatella      10 cm proximal to suprapatella      At midpatella / popliteal crease      30 cm proximal to floor at lateral plantar foot 36.6 36.7 36.6 36.3  20 cm proximal to floor at lateral plantar foot 29.1 29.3 28.5 29.0  10 cm proximal to floor at lateral plantar foot 23.2 22.5 23.4 23.2  Circumference of ankle/heel      5 cm proximal to 1st MTP joint 20.7     Across MTP joint 20.5 20.3 20.4 20.3  Around proximal great toe 7.3 7.1 7.1 7.3  (Blank rows = not tested)  GAIT: Distance walked: 40 ft Assistive device utilized: None Level of assistance: Complete Independence Comments: normal gait, no difficulties  Outcome measure:  TODAY'S TREATMENT:  DATE:  11/10/2022  Pt remeasured bilaterally MLD: In Supine with HOB elevated and legs elevated on large bolster and smaller round bolster: Short neck, 5 diaphragmatic breaths, Lt inguinal nodes, (no anastomosis due to Lt ALND in 1990); and then Lt LE working from proximal to distal, then retracing all steps (with intact sequence) and ending with LN's . PT performed  all steps and pt verbalized sequence to PT while PT was performing. MLD to the right LE using the right axillary Lns. Therapist then activated right LN's a axillary LN's,right  inguinal-axillary pathway and Right LE lateral thigh, then medial to lateral retracing steps, knee and lower leg retracing steps then right foot retracing steps and ending with LN"S. Therapist performed all on this side as well with verbalization to pt.  10/15/2022  MLD: In Supine with HOB elevated and legs elevated on large bolster and smaller round bolster: Short neck, 5 diaphragmatic breaths, Lt inguinal nodes, (no anastomosis due to Lt ALND in 1990); and then Lt LE working from proximal to distal, then retracing all  steps (with intact sequence) and ending with LN's . Pt erformed  all steps and then therapist performed all  steps on left and right LE as below. She did very well with only a few cues required. MLD to the right LE using the right axillary Lns. Therapist then activated right LN's a axillary LN's,right  inguinal-axillary pathway and Right LE lateral thigh, then medial to lateral retracing steps, knee and lower leg retracing steps then right foot retracing steps and ending with LN"S. Therapist performed all on this side with verbalization to pt.  10/13/2022   MLD: In Supine with HOB elevated and legs elevated on large bolster and smaller round bolster: Short neck, 5 diaphragmatic breaths, Lt inguinal nodes, (no anastomosis due to Lt ALND in 1990); and then Lt LE working from proximal to distal, then retracing all steps (with intact sequence) and ending with LN's . Therpist performed steps and then had pt repeat steps verbally and with use of hands.. She did very well with occasional VC and TC's MLD to the right LE using the right axillary Lns. Therapist then activated right LN's a axillary LN's,right  inguinal-axillary pathway and Right LE lateral thigh, then medial to lateral retracing steps, knee and lower leg retracing steps then right foot retracing steps and ending with LN"S. Therapist performed all on this side with verbalization to pt. Pt given handout demonstrating proper sequences  10/09/2022 Measured pts legs bilaterally and they remain stable with her 8-15 mmHG compression stockings MLD: In Supine with HOB elevated and legs elevated on large bolster and smaller round bolster: Short neck, 5 diaphragmatic breaths, Lt inguinal nodes, (no anastomosis due to Lt ALND in 1990); and then Lt LE working from proximal to distal, then retracing all steps (with intact sequence) and ending with LN's . Therpist performed steps and then had pt repeat steps verbally and with use of hands.. She did very well with  occasional VC and TC's MLD to the right LE using the right axillary Lns. Therapist then activated right LN's a axillary LN's,right  inguinal-axillary pathway and Right LE lateral thigh, then medial to lateral retracing steps, knee and lower leg retracing steps then right foot retracing steps and ending with LN"S. Therapist performed all on this side with verbalization to pt.  10/07/2022  MLD: In Supine with HOB elevated and legs elevated on large bolster and smaller round bolster: Short neck, 5 diaphragmatic breaths, Lt inguinal nodes, (no  anastomosis due to Lt ALND in 1990); and then Lt LE working from proximal to distal, then retracing all steps (with intact sequence) and ending with LN's . Therpist performed steps and then had pt repeat all. She did very well with occasional VC and TC's MLD to the right LE using the right axillary Lns. Therapist then activated right LN's a axillary LN's,right  inguinal-axillary pathway and Right LE lateral thigh, then medial to lateral retracing steps, knee and lower leg retracing steps then right foot retracing steps and ending with LN"S. Therapist performed all on this side with verbalization to pt.    09/30/2022  Pt brought her Juzo 15-20 compression socks with her but they are too warm for this time of year per pt. Removed her 8-15 MM Hg stockings and her legs look good MLD: In Supine with HOB elevated and legs elevated on large bolster and smaller round bolster: Short neck, 5 diaphragmatic breaths, Lt inguinal nodes, (no anastomosis due to Lt ALND in 1990); and then Lt LE working from proximal to distal, then retracing all steps (with intact sequence) and ending with LN's  MLD to the right LE using the right axillary Lns. Therapist then activated right LN's and had pt demonstrate each of the steps including axillary LN's,right  inguinal-axillary pathway and Right LE lateral thigh, then medial to lateral retracing steps, knee and lower leg retracing steps then  right foot retracing steps and ending with LN"S.   09/24/22  Pt removed compression stockings; wearing [employer] knee high compression 8-15 mmhg, and legs look OK today. Pt was able to don and doff independently MLD: In Supine with HOB elevated and legs elevated on large bolster and smaller round bolster: Short neck, 5 diaphragmatic breaths, Lt inguinal nodes, (no anastomosis due to Lt ALND in 1990); and then Lt LE working from proximal to distal, then retracing all steps (with intact sequence) and ending with LN's  MLD to the right LE using the right axillary Lns. Therapist then activated right LN's and had pt demonstrate each of the steps including axillary LN's,right  inguinal-axillary pathway and Right LE lateral thigh, then medial to lateral retracing steps, knee and lower leg retracing steps then right foot retracing steps and ending with LN"S.Pt practiced each step on the right and needs moderate VC's and visual cues.  6/24/24MLD: In Supine with HOB elevated and legs elevated on large bolster with pillows under knees: Short neck, 5 diaphragmatic breaths, Lt inguinal nodes, (no anastomosis due to Lt ALND in 1990); and then Lt LE working from proximal to distal, then retracing all steps (with intact sequence) but will continue intact for remaining sessions for Lt LE) /24/2024 Initiated MLD to left lower extremity; short neck, 5 breaths, left inguinal LN's, Lateral knee to thigh, then inner thigh to outer, repeating lateral thigh, then ankle to knee repeating both sides of leg , retracing steps  lateral thigh to hip, then left foot retracing all steps and ending with LN's at left groin.  PATIENT EDUCATION:  Education details: POC, bring garments next discussed legs will swell if not wearing compression Person educated: Patient Education method: Explanation and Demonstration Education comprehension: verbalized understanding, requires further education  HOME EXERCISE PROGRAM: None given  today  ASSESSMENT:  CLINICAL IMPRESSION: Pt has not been seen in PT for nearly 4 weeks due to a recent MI and fluctuations in her BP. Her legs are doing quite well with only slight increase on the left at 20 cm prox to floor.  Pt has a good understanding of sequence but requested PT perform MLD today. She does report the sensation of fluid moving in her legs during MLD by therapist. She will try to restart self MLD  at home. She requires several more visits for confidence and proficiency in self MLD.  OBJECTIVE IMPAIRMENTS: decreased knowledge of condition, decreased knowledge of use of DME, and increased edema.   ACTIVITY LIMITATIONS:  no limitations but legs swell more with being on feet for prolong periods or sitting prolonged periods  PARTICIPATION LIMITATIONS:  none  PERSONAL FACTORS:  none  are also affecting patient's functional outcome.   REHAB POTENTIAL: Excellent  CLINICAL DECISION MAKING: Stable/uncomplicated  EVALUATION COMPLEXITY: Low   GOALS: Goals reviewed with patient? Yes  SHORT TERM GOALS=LONG TERM GOALS: Target date: 10/20/2022     Pt will be independent in self MLD for long term management of lymphedema.  Baseline:  Goal status: ONGOING 2. Pt will obtain have/obtain appropriate compression garments for long term management of lymphedema.  Baseline: Goal status: NFA:[id number] garments sem to be holding well  3.  Pt will be more compliant with compression garments to maintain edema reduction Baseline:  Goal status: Nearly Met but did not have on today    PLAN:  PT FREQUENCY: 3 visits  PT DURATION: 6 weeks   PLANNED INTERVENTIONS:  Therapeutic exercises, Patient/Family education, Self Care, Orthotic/Fit training, DME instructions, Manual lymph drainage, Compression bandaging, Vasopneumatic device, and Manual therapy   PLAN FOR NEXT SESSION: check BP prn,begin MLD to bilateral LE and instruct/review self MLD technique, look at old compression garments  and determine if she needs new ones. Encourage compliance with compression.   Waynette Buttery, PT 11/10/2022, 6:05 PM

## 2022-11-12 ENCOUNTER — Telehealth (HOSPITAL_COMMUNITY): Payer: Self-pay

## 2022-11-12 NOTE — Telephone Encounter (Signed)
  Called pt to confirm appt for 11/13/22 at 0800. Gave pt instructions for appt, what to wear, office address, eating/taking meds before, and if sick to call and reschedule. Pt voiced understanding, all questions answered.   Health history completed? Yes   [name], MS, ACSM-CEP 11/12/2022 1:37 PM

## 2022-11-13 ENCOUNTER — Telehealth (HOSPITAL_COMMUNITY): Payer: Self-pay

## 2022-11-13 ENCOUNTER — Telehealth: Payer: Self-pay | Admitting: Cardiology

## 2022-11-13 ENCOUNTER — Encounter (HOSPITAL_COMMUNITY)
Admission: RE | Admit: 2022-11-13 | Discharge: 2022-11-13 | Disposition: A | Discharge status: Home / Self Care | Payer: Medicare HMO | Source: Ambulatory Visit | Attending: Cardiology | Admitting: Cardiology

## 2022-11-13 VITALS — BP 148/72 | HR 62 | Ht 58.5 in | Wt 127.2 lb

## 2022-11-13 DIAGNOSIS — I214 Non-ST elevation (NSTEMI) myocardial infarction: Secondary | ICD-10-CM

## 2022-11-13 DIAGNOSIS — I252 Old myocardial infarction: Secondary | ICD-10-CM | POA: Diagnosis not present

## 2022-11-13 DIAGNOSIS — M1711 Unilateral primary osteoarthritis, right knee: Secondary | ICD-10-CM | POA: Diagnosis not present

## 2022-11-13 DIAGNOSIS — Z5189 Encounter for other specified aftercare: Secondary | ICD-10-CM | POA: Insufficient documentation

## 2022-11-13 NOTE — Telephone Encounter (Signed)
Returned call to [facility] at cardiac rehab and relayed the information below from Dr. [place]. Beth Simon verbalized understanding.  I think this is going to be her baseline. She has CAD not amenable to revascularization. Would benefit from Cardiac Rehab and I would recommend continuing unless symptoms worsen  Peter Sw[place]iland MD, [facility]

## 2022-11-13 NOTE — Progress Notes (Signed)
Cardiac Rehab Medication Review   Does the patient  feel that his/her medications are working for him/her?  YES  Has the patient been experiencing any side effects to the medications prescribed?  NO  Does the patient measure his/her own blood pressure or blood glucose at home?  YES   Does the patient have any problems obtaining medications due to transportation or finances?   NO  Understanding of regimen: good Understanding of indications: good Potential of compliance: excellent    Comments: Beth Simon. She takes her BP daily and keeps a log.     [name], MS, ACSM-CEP 11/13/2022 8:43 AM

## 2022-11-13 NOTE — Telephone Encounter (Signed)
Cardiac Rehab called and stated they would like some guidance or recommendation with [name]. Pt states that the fullness she feels in her chest is her baseline. Pt had no other symptoms per Cardiac Rehab therapist. BP before starting exercise was 148/72 and after exercising was 192/76 and 5 mins later it was still the same pressure with NSR. The fullness got worse when she started her excise and then got better when she stopped her exercise. The is her evaluation visit and CR just wants to know what to do moving forward.

## 2022-11-13 NOTE — Telephone Encounter (Signed)
Called to inform pt she is cleared to start CRP2 on 8/21. Pt voiced understanding.   [name], MS, ACSM-CEP 11/13/2022 1:23 PM

## 2022-11-13 NOTE — Progress Notes (Signed)
Cardiac Individual Treatment Plan  Patient Details  Name: Beth Simon MRN: [id number] Date of Birth: Apr 16, 1940 Referring Provider:   Flowsheet Row INTENSIVE CARDIAC REHAB ORIENT from 11/13/2022 in [facility]  Referring Provider Peter [place], MD       Initial Encounter Date:  Flowsheet Row INTENSIVE CARDIAC REHAB ORIENT from 11/13/2022 in [facility]  Date 11/13/22       Visit Diagnosis: 10/17/22 NSTEMI (non-ST elevated myocardial infarction) [facility])  Patient's Home Medications on Admission:  Current Outpatient Medications:    aspirin EC 81 MG tablet, Take 1 tablet (81 mg total) by mouth daily. Swallow whole., Disp: 30 tablet, Rfl: 0   atorvastatin (LIPITOR) 40 MG tablet, Take 1 tablet (40 mg total) by mouth daily., Disp: 30 tablet, Rfl: 0   carvedilol (COREG) 6.25 MG tablet, Take 1 tablet (6.25 mg total) by mouth 2 (two) times daily., Disp: 180 tablet, Rfl: 3   clopidogrel (PLAVIX) 75 MG tablet, Take 1 tablet (75 mg total) by mouth daily with breakfast., Disp: 30 tablet, Rfl: 0   Coenzyme Q10 (COQ10 PO), Take 1 tablet by mouth every morning. , Disp: , Rfl:    Cyanocobalamin (VITAMIN B 12 PO), Take 1 tablet by mouth every morning. , Disp: , Rfl:    dapagliflozin propanediol (FARXIGA) 10 MG TABS tablet, Take 1 tablet (10 mg total) by mouth daily before breakfast., Disp: 30 tablet, Rfl: 1   denosumab (PROLIA) 60 MG/ML SOLN injection, Inject 60 mg into the skin every 6 (six) months. Administer in upper arm, thigh, or abdomen, Disp: , Rfl:    Flaxseed, Linseed, (FLAX SEEDS PO), Take 1 scoop by mouth every morning. Ground flaxseed in cereal., Disp: , Rfl:    hydrALAZINE (APRESOLINE) 25 MG tablet, Take 1 tablet (25 mg total) by mouth as needed (for blood pressure greater than 160/100. May take up to twice daily.)., Disp: 180 tablet, Rfl: 3   losartan (COZAAR) 25 MG tablet,  Take 1 tablet (25 mg total) by mouth daily for 30 doses., Disp: 90 tablet, Rfl: 3   Multiple Vitamins-Minerals (PRESERVISION AREDS) CAPS, Take 1 capsule by mouth daily., Disp: , Rfl:    OVER THE COUNTER MEDICATION, Take 1 tablet by mouth every morning. Astazanthin. (anti-oxidant), Disp: , Rfl:    VITAMIN D, CHOLECALCIFEROL, PO, Take 4,000 Int'l Units by mouth every morning. , Disp: , Rfl:   Past Medical History: Past Medical History:  Diagnosis Date   Adenomatous polyps 01/2016   Anemia    Breast cancer (HCC) 1990   bilateral mastectomy, adenoca breast-left MRM, reconstruction, chemo   Bronchitis last 2 weeks   saw dr Pete Glatter 02-28-2014, he said no antibiotics needed, nonproductive cough   Complication of anesthesia    Cystitis    cytoxen, had once or twice   Family history of breast cancer    History of breast cancer    History of radiation therapy 2/10, 2/12, 2/18, 2/24, 05/29/14   vaginal cuff/ 30 Gy/ 5 fx   Hypertension    Macular degeneration    Neck pain    taking physictal therapy last 2 weeks   Osteoporosis    On Prolia.   PONV (postoperative nausea and vomiting)    Shoulder pain    taking physical therapy for last few weeks   Uterine cancer (HCC) 03/21/2014   MLH1/PMS2 LOH   Uterine fibroid    VAIN I (  vaginal intraepithelial neoplasia grade I) 2018   positive HR HPV.   Vasovagal syncopes     Tobacco Use: Social History   Tobacco Use  Smoking Status Never  Smokeless Tobacco Never    Labs: Review Flowsheet       Latest Ref Rng & Units 10/07/2013 10/17/2022  Labs for ITP Cardiac and Pulmonary Rehab  Cholestrol 0 - 200 mg/dL - 638   LDL (calc) 0 - 99 mg/dL - 756   HDL-C >43 mg/dL - 78   Trlycerides <329 mg/dL - 79   Hemoglobin J1O 4.8 - 5.6 % 5.9  5.7   PH, Arterial 7.35 - 7.45 - 7.410   PCO2 arterial 32 - 48 mmHg - 33.2   Bicarbonate 20.0 - 28.0 mmol/L 20.0 - 28.0 mmol/L - 22.2  23.8  21.0   TCO2 22 - 32 mmol/L 22 - 32 mmol/L - 23  25  22  23     Acid-base deficit 0.0 - 2.0 mmol/L - 2.0  3.0   O2 Saturation % % - 64  63  94     Details       Multiple values from one day are sorted in reverse-chronological order         Capillary Blood Glucose: Lab Results  Component Value Date   GLUCAP 80 05/09/2014     Exercise Target Goals: Exercise Program Goal: Individual exercise prescription set using results from initial 6 min walk test and THRR while considering  patient's activity barriers and safety.   Exercise Prescription Goal: Initial exercise prescription builds to 30-45 minutes a day of aerobic activity, 2-3 days per week.  Home exercise guidelines will be given to patient during program as part of exercise prescription that the participant will acknowledge.  Activity Barriers & Risk Stratification:  Activity Barriers & Cardiac Risk Stratification - 11/13/22 1148       Activity Barriers & Cardiac Risk Stratification   Activity Barriers Decreased Ventricular Function;Chest Pain/Angina    Cardiac Risk Stratification High   <5 METs on            6 Minute Walk:  6 Minute Walk     Row Name 11/13/22 1145         6 Minute Walk   Phase Initial     Distance 480 feet     Walk Time 6 minutes     # of Rest Breaks 1  2:00-6:00     MPH 0.91     METS 0.83     RPE 13     Perceived Dyspnea  0     VO2 Peak 2.91     Symptoms Yes (comment)     Comments Chest pressure reported at 2 min mark. Test aborted, BP 190/72, 98% NSR. No other symptoms reported, pressure resolved once seated. RN Corrie Dandy made aware, contacted Dr. Swaziland.     Resting HR 62 bpm     Resting BP 148/72     Resting Oxygen Saturation  97 %     Exercise Oxygen Saturation  during 6 min walk 98 %     Max Ex. HR 82 bpm     Max Ex. BP 190/72     2 Minute Post BP 188/70              Oxygen Initial Assessment:   Oxygen Re-Evaluation:   Oxygen Discharge (Final Oxygen Re-Evaluation):   Initial Exercise Prescription:  Initial Exercise  Prescription - 11/13/22 1100  Date of Initial Exercise RX and Referring Provider   Date 11/13/22    Referring Provider Peter [place], MD    Expected Discharge Date 02/04/23      NuStep   Level 1    SPM 60    Minutes 15    METs 1.8      Track   Laps 15    Minutes 15    METs 2      Prescription Details   Frequency (times per week) 3    Duration Progress to 30 minutes of continuous aerobic without signs/symptoms of physical distress      Intensity   THRR 40-80% of Max Heartrate 55-110    Ratings of Perceived Exertion 11-13    Perceived Dyspnea 0-4      Progression   Progression Continue progressive overload as per policy without signs/symptoms or physical distress.      Resistance Training   Training Prescription Yes    Weight 2    Reps 10-15             Perform Capillary Blood Glucose checks as needed.  Exercise Prescription Changes:   Exercise Comments:   Exercise Goals and Review:   Exercise Goals     Row Name 11/13/22 0821             Exercise Goals   Increase Physical Activity Yes       Intervention Provide advice, education, support and counseling about physical activity/exercise needs.;Develop an individualized exercise prescription for aerobic and resistive training based on initial evaluation findings, risk stratification, comorbidities and participant's personal goals.       Expected Outcomes Short Term: Attend rehab on a regular basis to increase amount of physical activity.;Long Term: Exercising regularly at least 3-5 days a week.;Long Term: Add in home exercise to make exercise part of routine and to increase amount of physical activity.       Increase Strength and Stamina Yes       Intervention Provide advice, education, support and counseling about physical activity/exercise needs.;Develop an individualized exercise prescription for aerobic and resistive training based on initial evaluation findings, risk stratification, comorbidities  and participant's personal goals.       Expected Outcomes Short Term: Perform resistance training exercises routinely during rehab and add in resistance training at home;Short Term: Increase workloads from initial exercise prescription for resistance, speed, and METs.;Long Term: Improve cardiorespiratory fitness, muscular endurance and strength as measured by increased METs and functional capacity ( )       Able to understand and use rate of perceived exertion (RPE) scale Yes       Intervention Provide education and explanation on how to use RPE scale       Expected Outcomes Short Term: Able to use RPE daily in rehab to express subjective intensity level;Long Term:  Able to use RPE to guide intensity level when exercising independently       Knowledge and understanding of Target Heart Rate Range (THRR) Yes       Intervention Provide education and explanation of THRR including how the numbers were predicted and where they are located for reference       Expected Outcomes Short Term: Able to state/look up THRR;Short Term: Able to use daily as guideline for intensity in rehab;Long Term: Able to use THRR to govern intensity when exercising independently       Understanding of Exercise Prescription Yes       Intervention Provide education, explanation, and written materials  on patient's individual exercise prescription       Expected Outcomes Short Term: Able to explain program exercise prescription;Long Term: Able to explain home exercise prescription to exercise independently                Exercise Goals Re-Evaluation :   Discharge Exercise Prescription (Final Exercise Prescription Changes):   Nutrition:  Target Goals: Understanding of nutrition guidelines, daily intake of sodium 1500mg , cholesterol 200mg , calories 30% from fat and 7% or less from saturated fats, daily to have 5 or more servings of fruits and vegetables.  Biometrics:  Pre Biometrics - 11/13/22 0804       Pre  Biometrics   Waist Circumference 34.5 inches    Hip Circumference 39 inches    Waist to Hip Ratio 0.88 %    Triceps Skinfold 26 mm    % Body Fat 39.6 %    Grip Strength 22 kg    Flexibility 12 in    Single Leg Stand 13.2 seconds              Nutrition Therapy Plan and Nutrition Goals:   Nutrition Assessments:  MEDIFICTS Score Key: ?70 Need to make dietary changes  40-70 Heart Healthy Diet ? 40 Therapeutic Level Cholesterol Diet    Picture Your Plate Scores: <16 Unhealthy dietary pattern with much room for improvement. 41-50 Dietary pattern unlikely to meet recommendations for good health and room for improvement. 51-60 More healthful dietary pattern, with some room for improvement.  >60 Healthy dietary pattern, although there may be some specific behaviors that could be improved.    Nutrition Goals Re-Evaluation:   Nutrition Goals Re-Evaluation:   Nutrition Goals Discharge (Final Nutrition Goals Re-Evaluation):   Psychosocial: Target Goals: Acknowledge presence or absence of significant depression and/or stress, maximize coping skills, provide positive support system. Participant is able to verbalize types and ability to use techniques and skills needed for reducing stress and depression.  Initial Review & Psychosocial Screening:  Initial Psych Review & Screening - 11/13/22 1150       Initial Review   Current issues with None Identified      Family Dynamics   Good Support System? Yes   Clarsie has her son and friends for support     Barriers   Psychosocial barriers to participate in program There are no identifiable barriers or psychosocial needs.      Screening Interventions   Interventions Encouraged to exercise;Provide feedback about the scores to participant    Expected Outcomes Short Term goal: Identification and review with participant of any Quality of Life or Depression concerns found by scoring the questionnaire.;Long Term goal: The participant  improves quality of Life and PHQ9 Scores as seen by post scores and/or verbalization of changes             Quality of Life Scores:  Quality of Life - 11/13/22 1151       Quality of Life   Select Quality of Life      Quality of Life Scores   Health/Function Pre 28.18 %    Socioeconomic Pre 27.75 %    Psych/Spiritual Pre 28.93 %    Family Pre 27 %    GLOBAL Pre 28.11 %            Scores of 19 and below usually indicate a poorer quality of life in these areas.  A difference of  2-3 points is a clinically meaningful difference.  A difference of 2-3 points in the  total score of the Quality of Life Index has been associated with significant improvement in overall quality of life, self-image, physical symptoms, and general health in studies assessing change in quality of life.  PHQ-9: Review Flowsheet  More data exists      11/13/2022 10/16/2017 10/10/2016 10/05/2015 01/05/2015  Depression screen PHQ 2/9  Decreased Interest 1 0 0 0 1  Down, Depressed, Hopeless 0 0 0 0 1  PHQ - 2 Score 1 0 0 0 2  Altered sleeping 0 - - - -  Tired, decreased energy 1 - - - -  Change in appetite 1 - - - -  Feeling bad or failure about yourself  0 - - - -  Trouble concentrating 0 - - - -  Moving slowly or fidgety/restless 0 - - - -  Suicidal thoughts 0 - - - -  PHQ-9 Score 3 - - - -  Difficult doing work/chores Not difficult at all - - - -    Details           Interpretation of Total Score  Total Score Depression Severity:  1-4 = Minimal depression, 5-9 = Mild depression, 10-14 = Moderate depression, 15-19 = Moderately severe depression, 20-27 = Severe depression   Psychosocial Evaluation and Intervention:   Psychosocial Re-Evaluation:   Psychosocial Discharge (Final Psychosocial Re-Evaluation):   Vocational Rehabilitation: Provide vocational rehab assistance to qualifying candidates.   Vocational Rehab Evaluation & Intervention:  Vocational Rehab - 11/13/22 0824        Initial Vocational Rehab Evaluation & Intervention   Assessment shows need for Vocational Rehabilitation No   Selisa is retired            Education: Education Goals: Education classes will be provided on a weekly basis, covering required topics. Participant will state understanding/return demonstration of topics presented.     Core Videos: Exercise    Move It!  Clinical staff conducted group or individual video education with verbal and written material and guidebook.  Patient learns the recommended Pritikin exercise program. Exercise with the goal of living a long, healthy life. Some of the health benefits of exercise include controlled diabetes, healthier blood pressure levels, improved cholesterol levels, improved heart and lung capacity, improved sleep, and better body composition. Everyone should speak with their doctor before starting or changing an exercise routine.  Biomechanical Limitations Clinical staff conducted group or individual video education with verbal and written material and guidebook.  Patient learns how biomechanical limitations can impact exercise and how we can mitigate and possibly overcome limitations to have an impactful and balanced exercise routine.  Body Composition Clinical staff conducted group or individual video education with verbal and written material and guidebook.  Patient learns that body composition (ratio of muscle mass to fat mass) is a key component to assessing overall fitness, rather than body weight alone. Increased fat mass, especially visceral belly fat, can put [place] at increased risk for metabolic syndrome, type 2 diabetes, heart disease, and even death. It is recommended to combine diet and exercise (cardiovascular and resistance training) to improve your body composition. Seek guidance from your physician and exercise physiologist before implementing an exercise routine.  Exercise Action Plan Clinical staff conducted group or  individual video education with verbal and written material and guidebook.  Patient learns the recommended strategies to achieve and enjoy long-term exercise adherence, including variety, self-motivation, self-efficacy, and positive decision making. Benefits of exercise include fitness, good health, weight management, more energy, better sleep, less stress,  and overall well-being.  Medical   Heart Disease Risk Reduction Clinical staff conducted group or individual video education with verbal and written material and guidebook.  Patient learns our heart is our most vital organ as it circulates oxygen, nutrients, white blood cells, and hormones throughout the entire body, and carries waste away. Data supports a plant-based eating plan like the Pritikin Program for its effectiveness in slowing progression of and reversing heart disease. The video provides a number of recommendations to address heart disease.   Metabolic Syndrome and Belly Fat  Clinical staff conducted group or individual video education with verbal and written material and guidebook.  Patient learns what metabolic syndrome is, how it leads to heart disease, and how one can reverse it and keep it from coming back. You have metabolic syndrome if you have 3 of the following 5 criteria: abdominal obesity, high blood pressure, high triglycerides, low HDL cholesterol, and high blood sugar.  Hypertension and Heart Disease Clinical staff conducted group or individual video education with verbal and written material and guidebook.  Patient learns that high blood pressure, or hypertension, is very common in the [place]. Hypertension is largely due to excessive salt intake, but other important risk factors include being overweight, physical inactivity, drinking too much alcohol, smoking, and not eating enough potassium from fruits and vegetables. High blood pressure is a leading risk factor for heart attack, stroke, congestive heart failure,  dementia, kidney failure, and premature death. Long-term effects of excessive salt intake include stiffening of the arteries and thickening of heart muscle and organ damage. Recommendations include ways to reduce hypertension and the risk of heart disease.  Diseases of Our Time - Focusing on Diabetes Clinical staff conducted group or individual video education with verbal and written material and guidebook.  Patient learns why the best way to stop diseases of our time is prevention, through food and other lifestyle changes. Medicine (such as prescription pills and surgeries) is often only a Band-Aid on the problem, not a long-term solution. Most common diseases of our time include obesity, type 2 diabetes, hypertension, heart disease, and cancer. The Pritikin Program is recommended and has been proven to help reduce, reverse, and/or prevent the damaging effects of metabolic syndrome.  Nutrition   Overview of the Pritikin Eating Plan  Clinical staff conducted group or individual video education with verbal and written material and guidebook.  Patient learns about the Pritikin Eating Plan for disease risk reduction. The Pritikin Eating Plan emphasizes a wide variety of unrefined, minimally-processed carbohydrates, like fruits, vegetables, whole grains, and legumes. Go, Caution, and Stop food choices are explained. Plant-based and lean animal proteins are emphasized. Rationale provided for low sodium intake for blood pressure control, low added sugars for blood sugar stabilization, and low added fats and oils for coronary artery disease risk reduction and weight management.  Calorie Density  Clinical staff conducted group or individual video education with verbal and written material and guidebook.  Patient learns about calorie density and how it impacts the Pritikin Eating Plan. Knowing the characteristics of the food you choose will help you decide whether those foods will lead to weight gain or weight  loss, and whether you want to consume more or less of them. Weight loss is usually a side effect of the Pritikin Eating Plan because of its focus on low calorie-dense foods.  Label Reading  Clinical staff conducted group or individual video education with verbal and written material and guidebook.  Patient learns about the Pritikin  recommended label reading guidelines and corresponding recommendations regarding calorie density, added sugars, sodium content, and whole grains.  Dining Out - Part 1  Clinical staff conducted group or individual video education with verbal and written material and guidebook.  Patient learns that restaurant meals can be sabotaging because they can be so high in calories, fat, sodium, and/or sugar. Patient learns recommended strategies on how to positively address this and avoid unhealthy pitfalls.  Facts on Fats  Clinical staff conducted group or individual video education with verbal and written material and guidebook.  Patient learns that lifestyle modifications can be just as effective, if not more so, as many medications for lowering your risk of heart disease. A Pritikin lifestyle can help to reduce your risk of inflammation and atherosclerosis (cholesterol build-up, or plaque, in the artery walls). Lifestyle interventions such as dietary choices and physical activity address the cause of atherosclerosis. A review of the types of fats and their impact on blood cholesterol levels, along with dietary recommendations to reduce fat intake is also included.  Nutrition Action Plan  Clinical staff conducted group or individual video education with verbal and written material and guidebook.  Patient learns how to incorporate Pritikin recommendations into their lifestyle. Recommendations include planning and keeping personal health goals in mind as an important part of their success.  Healthy Mind-Set    Healthy Minds, Bodies, Hearts  Clinical staff conducted group or  individual video education with verbal and written material and guidebook.  Patient learns how to identify when they are stressed. Video will discuss the impact of that stress, as well as the many benefits of stress management. Patient will also be introduced to stress management techniques. The way we think, act, and feel has an impact on our hearts.  How Our Thoughts Can Heal Our Hearts  Clinical staff conducted group or individual video education with verbal and written material and guidebook.  Patient learns that negative thoughts can cause depression and anxiety. This can result in negative lifestyle behavior and serious health problems. Cognitive behavioral therapy is an effective method to help control our thoughts in order to change and improve our emotional outlook.  Additional Videos:  Exercise    Improving Performance  Clinical staff conducted group or individual video education with verbal and written material and guidebook.  Patient learns to use a non-linear approach by alternating intensity levels and lengths of time spent exercising to help burn more calories and lose more body fat. Cardiovascular exercise helps improve heart health, metabolism, hormonal balance, blood sugar control, and recovery from fatigue. Resistance training improves strength, endurance, balance, coordination, reaction time, metabolism, and muscle mass. Flexibility exercise improves circulation, posture, and balance. Seek guidance from your physician and exercise physiologist before implementing an exercise routine and learn your capabilities and proper form for all exercise.  Introduction to Yoga  Clinical staff conducted group or individual video education with verbal and written material and guidebook.  Patient learns about yoga, a discipline of the coming together of mind, breath, and body. The benefits of yoga include improved flexibility, improved range of motion, better posture and core strength, increased  lung function, weight loss, and positive self-image. Yoga's heart health benefits include lowered blood pressure, healthier heart rate, decreased cholesterol and triglyceride levels, improved immune function, and reduced stress. Seek guidance from your physician and exercise physiologist before implementing an exercise routine and learn your capabilities and proper form for all exercise.  Medical   Aging: Manufacturing systems engineer of [employer]  Clinical staff conducted group or individual video education with verbal and written material and guidebook.  Patient learns key strategies and recommendations to stay in good physical health and enhance quality of life, such as prevention strategies, having an advocate, securing a Health Care Proxy and Power of Attorney, and keeping a list of medications and system for tracking them. It also discusses how to avoid risk for bone loss.  Biology of Weight Control  Clinical staff conducted group or individual video education with verbal and written material and guidebook.  Patient learns that weight gain occurs because we consume more calories than we burn (eating more, moving less). Even if your body weight is normal, you may have higher ratios of fat compared to muscle mass. Too much body fat puts you at increased risk for cardiovascular disease, heart attack, stroke, type 2 diabetes, and obesity-related cancers. In addition to exercise, following the Pritikin Eating Plan can help reduce your risk.  Decoding Lab Results  Clinical staff conducted group or individual video education with verbal and written material and guidebook.  Patient learns that lab test reflects one measurement whose values change over time and are influenced by many factors, including medication, stress, sleep, exercise, food, hydration, pre-existing medical conditions, and more. It is recommended to use the knowledge from this video to become more involved with your lab results and evaluate your  numbers to speak with your doctor.   Diseases of Our Time - Overview  Clinical staff conducted group or individual video education with verbal and written material and guidebook.  Patient learns that according to the CDC, 50% to 70% of chronic diseases (such as obesity, type 2 diabetes, elevated lipids, hypertension, and heart disease) are avoidable through lifestyle improvements including healthier food choices, listening to satiety cues, and increased physical activity.  Sleep Disorders Clinical staff conducted group or individual video education with verbal and written material and guidebook.  Patient learns how good quality and duration of sleep are important to overall health and well-being. Patient also learns about sleep disorders and how they impact health along with recommendations to address them, including discussing with a physician.  Nutrition  Dining Out - Part 2 Clinical staff conducted group or individual video education with verbal and written material and guidebook.  Patient learns how to plan ahead and communicate in order to maximize their dining experience in a healthy and nutritious manner. Included are recommended food choices based on the type of restaurant the patient is visiting.   Fueling a Banker conducted group or individual video education with verbal and written material and guidebook.  There is a strong connection between our food choices and our health. Diseases like obesity and type 2 diabetes are very prevalent and are in large-part due to lifestyle choices. The Pritikin Eating Plan provides plenty of food and hunger-curbing satisfaction. It is easy to follow, affordable, and helps reduce health risks.  Menu Workshop  Clinical staff conducted group or individual video education with verbal and written material and guidebook.  Patient learns that restaurant meals can sabotage health goals because they are often packed with calories, fat,  sodium, and sugar. Recommendations include strategies to plan ahead and to communicate with the manager, chef, or server to help order a healthier meal.  Planning Your Eating Strategy  Clinical staff conducted group or individual video education with verbal and written material and guidebook.  Patient learns about the Pritikin Eating Plan and its benefit of reducing the risk  of disease. The Pritikin Eating Plan does not focus on calories. Instead, it emphasizes high-quality, nutrient-rich foods. By knowing the characteristics of the foods, we choose, we can determine their calorie density and make informed decisions.  Targeting Your Nutrition Priorities  Clinical staff conducted group or individual video education with verbal and written material and guidebook.  Patient learns that lifestyle habits have a tremendous impact on disease risk and progression. This video provides eating and physical activity recommendations based on your personal health goals, such as reducing LDL cholesterol, losing weight, preventing or controlling type 2 diabetes, and reducing high blood pressure.  Vitamins and Minerals  Clinical staff conducted group or individual video education with verbal and written material and guidebook.  Patient learns different ways to obtain key vitamins and minerals, including through a recommended healthy diet. It is important to discuss all supplements you take with your doctor.   Healthy Mind-Set    Smoking Cessation  Clinical staff conducted group or individual video education with verbal and written material and guidebook.  Patient learns that cigarette smoking and tobacco addiction pose a serious health risk which affects millions of people. Stopping smoking will significantly reduce the risk of heart disease, lung disease, and many forms of cancer. Recommended strategies for quitting are covered, including working with your doctor to develop a successful plan.  Culinary    Becoming a Set designer conducted group or individual video education with verbal and written material and guidebook.  Patient learns that cooking at home can be healthy, cost-effective, quick, and puts them in control. Keys to cooking healthy recipes will include looking at your recipe, assessing your equipment needs, planning ahead, making it simple, choosing cost-effective seasonal ingredients, and limiting the use of added fats, salts, and sugars.  Cooking - Breakfast and Snacks  Clinical staff conducted group or individual video education with verbal and written material and guidebook.  Patient learns how important breakfast is to satiety and nutrition through the entire day. Recommendations include key foods to eat during breakfast to help stabilize blood sugar levels and to prevent overeating at meals later in the day. Planning ahead is also a key component.  Cooking - Educational psychologist conducted group or individual video education with verbal and written material and guidebook.  Patient learns eating strategies to improve overall health, including an approach to cook more at home. Recommendations include thinking of animal protein as a side on your plate rather than center stage and focusing instead on lower calorie dense options like vegetables, fruits, whole grains, and plant-based proteins, such as beans. Making sauces in large quantities to freeze for later and leaving the skin on your vegetables are also recommended to maximize your experience.  Cooking - Healthy Salads and Dressing Clinical staff conducted group or individual video education with verbal and written material and guidebook.  Patient learns that vegetables, fruits, whole grains, and legumes are the foundations of the Pritikin Eating Plan. Recommendations include how to incorporate each of these in flavorful and healthy salads, and how to create homemade salad dressings. Proper handling of  ingredients is also covered. Cooking - Soups and [employer] - Soups and Desserts Clinical staff conducted group or individual video education with verbal and written material and guidebook.  Patient learns that Pritikin soups and desserts make for easy, nutritious, and delicious snacks and meal components that are low in sodium, fat, sugar, and calorie density, while high in vitamins, minerals, and filling  fiber. Recommendations include simple and healthy ideas for soups and desserts.   Overview     The Pritikin Solution Program Overview Clinical staff conducted group or individual video education with verbal and written material and guidebook.  Patient learns that the results of the Pritikin Program have been documented in more than 100 articles published in peer-reviewed journals, and the benefits include reducing risk factors for (and, in some cases, even reversing) high cholesterol, high blood pressure, type 2 diabetes, obesity, and more! An overview of the three key pillars of the Pritikin Program will be covered: eating well, doing regular exercise, and having a healthy mind-set.  WORKSHOPS  Exercise: Exercise Basics: Building Your Action Plan Clinical staff led group instruction and group discussion with PowerPoint presentation and patient guidebook. To enhance the learning environment the use of posters, models and videos may be added. At the conclusion of this workshop, patients will comprehend the difference between physical activity and exercise, as well as the benefits of incorporating both, into their routine. Patients will understand the FITT (Frequency, Intensity, Time, and Type) principle and how to use it to build an exercise action plan. In addition, safety concerns and other considerations for exercise and cardiac rehab will be addressed by the presenter. The purpose of this lesson is to promote a comprehensive and effective weekly exercise routine in order to improve  patients' overall level of fitness.   Managing Heart Disease: Your Path to a Healthier Heart Clinical staff led group instruction and group discussion with PowerPoint presentation and patient guidebook. To enhance the learning environment the use of posters, models and videos may be added.At the conclusion of this workshop, patients will understand the anatomy and physiology of the heart. Additionally, they will understand how Pritikin's three pillars impact the risk factors, the progression, and the management of heart disease.  The purpose of this lesson is to provide a high-level overview of the heart, heart disease, and how the Pritikin lifestyle positively impacts risk factors.  Exercise Biomechanics Clinical staff led group instruction and group discussion with PowerPoint presentation and patient guidebook. To enhance the learning environment the use of posters, models and videos may be added. Patients will learn how the structural parts of their bodies function and how these functions impact their daily activities, movement, and exercise. Patients will learn how to promote a neutral spine, learn how to manage pain, and identify ways to improve their physical movement in order to promote healthy living. The purpose of this lesson is to expose patients to common physical limitations that impact physical activity. Participants will learn practical ways to adapt and manage aches and pains, and to minimize their effect on regular exercise. Patients will learn how to maintain good posture while sitting, walking, and lifting.  Balance Training and Fall Prevention  Clinical staff led group instruction and group discussion with PowerPoint presentation and patient guidebook. To enhance the learning environment the use of posters, models and videos may be added. At the conclusion of this workshop, patients will understand the importance of their sensorimotor skills (vision, proprioception, and the  vestibular system) in maintaining their ability to balance as they age. Patients will apply a variety of balancing exercises that are appropriate for their current level of function. Patients will understand the common causes for poor balance, possible solutions to these problems, and ways to modify their physical environment in order to minimize their fall risk. The purpose of this lesson is to teach patients about the importance of maintaining balance  as they age and ways to minimize their risk of falling.  WORKSHOPS   Nutrition:  Fueling a Ship broker led group instruction and group discussion with PowerPoint presentation and patient guidebook. To enhance the learning environment the use of posters, models and videos may be added. Patients will review the foundational principles of the Pritikin Eating Plan and understand what constitutes a serving size in each of the food groups. Patients will also learn Pritikin-friendly foods that are better choices when away from home and review make-ahead meal and snack options. Calorie density will be reviewed and applied to three nutrition priorities: weight maintenance, weight loss, and weight gain. The purpose of this lesson is to reinforce (in a group setting) the key concepts around what patients are recommended to eat and how to apply these guidelines when away from home by planning and selecting Pritikin-friendly options. Patients will understand how calorie density may be adjusted for different weight management goals.  Mindful Eating  Clinical staff led group instruction and group discussion with PowerPoint presentation and patient guidebook. To enhance the learning environment the use of posters, models and videos may be added. Patients will briefly review the concepts of the Pritikin Eating Plan and the importance of low-calorie dense foods. The concept of mindful eating will be introduced as well as the importance of paying attention  to internal hunger signals. Triggers for non-hunger eating and techniques for dealing with triggers will be explored. The purpose of this lesson is to provide patients with the opportunity to review the basic principles of the Pritikin Eating Plan, discuss the value of eating mindfully and how to measure internal cues of hunger and fullness using the Hunger Scale. Patients will also discuss reasons for non-hunger eating and learn strategies to use for controlling emotional eating.  Targeting Your Nutrition Priorities Clinical staff led group instruction and group discussion with PowerPoint presentation and patient guidebook. To enhance the learning environment the use of posters, models and videos may be added. Patients will learn how to determine their genetic susceptibility to disease by reviewing their family history. Patients will gain insight into the importance of diet as part of an overall healthy lifestyle in mitigating the impact of genetics and other environmental insults. The purpose of this lesson is to provide patients with the opportunity to assess their personal nutrition priorities by looking at their family history, their own health history and current risk factors. Patients will also be able to discuss ways of prioritizing and modifying the Pritikin Eating Plan for their highest risk areas  Menu  Clinical staff led group instruction and group discussion with PowerPoint presentation and patient guidebook. To enhance the learning environment the use of posters, models and videos may be added. Using menus brought in from [employer], or printed from [employer], patients will apply the Pritikin dining out guidelines that were presented in the [employer] video. Patients will also be able to practice these guidelines in a variety of provided scenarios. The purpose of this lesson is to provide patients with the opportunity to practice hands-on learning of the Pritikin  Dining Out guidelines with actual menus and practice scenarios.  Label Reading Clinical staff led group instruction and group discussion with PowerPoint presentation and patient guidebook. To enhance the learning environment the use of posters, models and videos may be added. Patients will review and discuss the Pritikin label reading guidelines presented in Pritikin's Label Reading Educational series video. Using fool labels brought in from  local grocery stores and markets, patients will apply the label reading guidelines and determine if the packaged food meet the Pritikin guidelines. The purpose of this lesson is to provide patients with the opportunity to review, discuss, and practice hands-on learning of the Pritikin Label Reading guidelines with actual packaged food labels. Cooking School  Pritikin's [employer] are designed to teach patients ways to prepare quick, simple, and affordable recipes at home. The importance of nutrition's role in chronic disease risk reduction is reflected in its emphasis in the overall Pritikin program. By learning how to prepare essential core Pritikin Eating Plan recipes, patients will increase control over what they eat; be able to customize the flavor of foods without the use of added salt, sugar, or fat; and improve the quality of the food they consume. By learning a set of core recipes which are easily assembled, quickly prepared, and affordable, patients are more likely to prepare more healthy foods at home. These workshops focus on convenient breakfasts, simple entres, side dishes, and desserts which can be prepared with minimal effort and are consistent with nutrition recommendations for cardiovascular risk reduction. Cooking [employer] are taught by a Armed forces logistics/support/administrative officer (RD) who has been trained by the [employer]. The chef or RD has a clear understanding of the importance of minimizing - if not completely eliminating -  added fat, sugar, and sodium in recipes. Throughout the series of Cooking School Workshop sessions, patients will learn about healthy ingredients and efficient methods of cooking to build confidence in their capability to prepare    Cooking School weekly topics:  Adding Flavor- Sodium-Free  Fast and Healthy Breakfasts  Powerhouse Plant-Based Proteins  Satisfying Salads and Dressings  Simple Sides and Sauces  International Cuisine-Spotlight on the [employer] Zones  Delicious Desserts  Savory Soups  [employer] - Meals in a Astronomer Appetizers and Snacks  Comforting Weekend Breakfasts  One-Pot Wonders   Fast Evening Meals  Landscape architect Your Pritikin Plate  WORKSHOPS   Healthy Mindset (Psychosocial):  Focused Goals, Sustainable Changes Clinical staff led group instruction and group discussion with PowerPoint presentation and patient guidebook. To enhance the learning environment the use of posters, models and videos may be added. Patients will be able to apply effective goal setting strategies to establish at least one personal goal, and then take consistent, meaningful action toward that goal. They will learn to identify common barriers to achieving personal goals and develop strategies to overcome them. Patients will also gain an understanding of how our mind-set can impact our ability to achieve goals and the importance of cultivating a positive and growth-oriented mind-set. The purpose of this lesson is to provide patients with a deeper understanding of how to set and achieve personal goals, as well as the tools and strategies needed to overcome common obstacles which may arise along the way.  From Head to Heart: The Power of a Healthy Outlook  Clinical staff led group instruction and group discussion with PowerPoint presentation and patient guidebook. To enhance the learning environment the use of posters, models and videos may be added. Patients will be able to  recognize and describe the impact of emotions and mood on physical health. They will discover the importance of self-care and explore self-care practices which may work for them. Patients will also learn how to utilize the 4 C's to cultivate a healthier outlook and better manage stress and challenges. The purpose of this lesson is to demonstrate  to patients how a healthy outlook is an essential part of maintaining good health, especially as they continue their cardiac rehab journey.  Healthy Sleep for a Healthy Heart Clinical staff led group instruction and group discussion with PowerPoint presentation and patient guidebook. To enhance the learning environment the use of posters, models and videos may be added. At the conclusion of this workshop, patients will be able to demonstrate knowledge of the importance of sleep to overall health, well-being, and quality of life. They will understand the symptoms of, and treatments for, common sleep disorders. Patients will also be able to identify daytime and nighttime behaviors which impact sleep, and they will be able to apply these tools to help manage sleep-related challenges. The purpose of this lesson is to provide patients with a general overview of sleep and outline the importance of quality sleep. Patients will learn about a few of the most common sleep disorders. Patients will also be introduced to the concept of "sleep hygiene," and discover ways to self-manage certain sleeping problems through simple daily behavior changes. Finally, the workshop will motivate patients by clarifying the links between quality sleep and their goals of heart-healthy living.   Recognizing and Reducing Stress Clinical staff led group instruction and group discussion with PowerPoint presentation and patient guidebook. To enhance the learning environment the use of posters, models and videos may be added. At the conclusion of this workshop, patients will be able to understand the  types of stress reactions, differentiate between acute and chronic stress, and recognize the impact that chronic stress has on their health. They will also be able to apply different coping mechanisms, such as reframing negative self-talk. Patients will have the opportunity to practice a variety of stress management techniques, such as deep abdominal breathing, progressive muscle relaxation, and/or guided imagery.  The purpose of this lesson is to educate patients on the role of stress in their lives and to provide healthy techniques for coping with it.  Learning Barriers/Preferences:  Learning Barriers/Preferences - 11/13/22 0272       Learning Barriers/Preferences   Learning Barriers None    Learning Preferences Group Instruction;Individual Instruction;Skilled Demonstration;Written Material;Pictoral             Education Topics:  Knowledge Questionnaire Score:  Knowledge Questionnaire Score - 11/13/22 0840       Knowledge Questionnaire Score   Pre Score 21/24             Core Components/Risk Factors/Patient Goals at Admission:  Personal Goals and Risk Factors at Admission - 11/13/22 0821       Core Components/Risk Factors/Patient Goals on Admission    Weight Management Yes;Weight Maintenance    Intervention Weight Management: Develop a combined nutrition and exercise program designed to reach desired caloric intake, while maintaining appropriate intake of nutrient and fiber, sodium and fats, and appropriate energy expenditure required for the weight goal.;Weight Management: Provide education and appropriate resources to help participant work on and attain dietary goals.    Expected Outcomes Short Term: Continue to assess and modify interventions until short term weight is achieved;Weight Maintenance: Understanding of the daily nutrition guidelines, which includes 25-35% calories from fat, 7% or less cal from saturated fats, less than 200mg  cholesterol, less than 1.5gm of  sodium, & 5 or more servings of fruits and vegetables daily;Understanding recommendations for meals to include 15-35% energy as protein, 25-35% energy from fat, 35-60% energy from carbohydrates, less than 200mg  of dietary cholesterol, 20-35 gm of total fiber daily;Understanding of distribution  of calorie intake throughout the day with the consumption of 4-5 meals/snacks    Heart Failure Yes    Intervention Provide a combined exercise and nutrition program that is supplemented with education, support and counseling about heart failure. Directed toward relieving symptoms such as shortness of breath, decreased exercise tolerance, and extremity edema.    Expected Outcomes Improve functional capacity of life;Short term: Attendance in program 2-3 days a week with increased exercise capacity. Reported lower sodium intake. Reported increased fruit and vegetable intake. Reports medication compliance.;Short term: Daily weights obtained and reported for increase. Utilizing diuretic protocols set by physician.;Long term: Adoption of self-care skills and reduction of barriers for early signs and symptoms recognition and intervention leading to self-care maintenance.    Hypertension Yes    Intervention Provide education on lifestyle modifcations including regular physical activity/exercise, weight management, moderate sodium restriction and increased consumption of fresh fruit, vegetables, and low fat dairy, alcohol moderation, and smoking cessation.;Monitor prescription use compliance.    Expected Outcomes Short Term: Continued assessment and intervention until BP is < 140/40mm HG in hypertensive participants. < 130/50mm HG in hypertensive participants with diabetes, heart failure or chronic kidney disease.;Long Term: Maintenance of blood pressure at goal levels.    Lipids Yes    Intervention Provide education and support for participant on nutrition & aerobic/resistive exercise along with prescribed medications to  achieve LDL 70mg , HDL >40mg .    Expected Outcomes Short Term: Participant states understanding of desired cholesterol values and is compliant with medications prescribed. Participant is following exercise prescription and nutrition guidelines.;Long Term: Cholesterol controlled with medications as prescribed, with individualized exercise RX and with personalized nutrition plan. Value goals: LDL < 70mg , HDL > 40 mg.             Core Components/Risk Factors/Patient Goals Review:    Core Components/Risk Factors/Patient Goals at Discharge (Final Review):    ITP Comments:  ITP Comments     Row Name 11/13/22 0805           ITP Comments Armanda Magic, MD: Medical Director.  Introduction to the [facility]/Intensive Cardiac Rehab.  Initial orientation packet reviewed with the patient.                Comments: Participant attended orientation for the cardiac rehabilitation program on  11/13/2022  to perform initial intake and exercise walk test. Patient introduced to the [facility] education and orientation packet was reviewed. Completed 6-minute walk test, measurements, initial ITP, and exercise prescription. Vital signs stable. Telemetry-normal sinus rhythm, mildly symptomatic. Chest pressure reported at 2 min mark of . Test aborted, BP 190/72, 98%, NSR. No other symptoms reported, chest pressure resolved once seated. RN Corrie Dandy made aware, contacted Dr. Swaziland. Dr. Swaziland gave clearance to begin exercise- see note. BP upon leaving 136/74, pt asymptomatic when leaving.   Service time was from 0800 to 1005.  [name], MS, ACSM-CEP 11/13/2022 11:52 AM

## 2022-11-13 NOTE — Telephone Encounter (Signed)
   Pt c/o of Chest Pain: STAT if active CP, including tightness, pressure, jaw pain, radiating pain to shoulder/upper arm/back, CP unrelieved by Nitro. Symptoms reported of SOB, nausea, vomiting, sweating.  1. Are you having CP right now? Fullness in her chest  while exerciing today in  Cardiac Rehab   2. Are you experiencing any other symptoms (ex. SOB, nausea, vomiting, sweating)? No- Rehab nurse said her blood pressure 14872 before exercising- after exercising 192/76- no NSR, no changes   3. Is your CP continuous or coming and going? Started when she was walking in Rehab, when she stopped walking, it stopped   4. Have you taken Nitroglycerin? no   5. How long have you been experiencing CP? Just started todayt    6. If NO CP at time of call then end call with telling Pt to call back or call 911 if Chest pain returns prior to return call from triage team.

## 2022-11-14 ENCOUNTER — Other Ambulatory Visit: Payer: Self-pay

## 2022-11-14 DIAGNOSIS — I2510 Atherosclerotic heart disease of native coronary artery without angina pectoris: Secondary | ICD-10-CM

## 2022-11-14 DIAGNOSIS — E871 Hypo-osmolality and hyponatremia: Secondary | ICD-10-CM

## 2022-11-14 DIAGNOSIS — E785 Hyperlipidemia, unspecified: Secondary | ICD-10-CM | POA: Diagnosis not present

## 2022-11-14 DIAGNOSIS — I10 Essential (primary) hypertension: Secondary | ICD-10-CM | POA: Diagnosis not present

## 2022-11-14 LAB — BASIC METABOLIC PANEL
BUN/Creatinine Ratio: 15 (ref 12–28)
BUN: 11 mg/dL (ref 8–27)
CO2: 20 mmol/L (ref 20–29)
Calcium: 9.2 mg/dL (ref 8.7–10.3)
Chloride: 103 mmol/L (ref 96–106)
Creatinine, Ser: 0.73 mg/dL (ref 0.57–1.00)
Glucose: 101 mg/dL — ABNORMAL HIGH (ref 70–99)
Potassium: 4.6 mmol/L (ref 3.5–5.2)
Sodium: 134 mmol/L (ref 134–144)
eGFR: 82 mL/min/{1.73_m2} (ref >59)

## 2022-11-17 ENCOUNTER — Encounter: Payer: Self-pay | Admitting: Student

## 2022-11-17 NOTE — Progress Notes (Signed)
Pt will continue to take/log BP/HR and will continue to call with high numbers

## 2022-11-17 NOTE — Progress Notes (Unsigned)
   Reviewed home BP/ HR log from 11/05/2022 to 8/16/024. BP mostly on the soft side with systolic BP in the 90s to 110s and diastolic BP in the 50s to 60s. She did have one higher reading of 150/81 on 8/7. Systolic BP also increased to 161 at cardiac rehab on 8/15 after 6 minute walk test. I would continue current BP medications (Losartan 25mg  daily and Coreg 6.25mg  twice daily). She also has PRN Hydralazine that she can take if BP >160/100.   Marcelino Duster, can you please just call patient and let her know? I will have BP/HR log scanned into system.  Thank you!  Corrin Parker, PA-C 11/17/2022 1:13 PM

## 2022-11-19 ENCOUNTER — Encounter (HOSPITAL_COMMUNITY)
Admission: RE | Admit: 2022-11-19 | Discharge: 2022-11-19 | Disposition: A | Discharge status: Home / Self Care | Payer: Medicare HMO | Source: Ambulatory Visit | Attending: Cardiology | Admitting: Cardiology

## 2022-11-19 DIAGNOSIS — I214 Non-ST elevation (NSTEMI) myocardial infarction: Secondary | ICD-10-CM

## 2022-11-19 NOTE — Progress Notes (Signed)
Incomplete Session Note  Patient Details  Name: Beth Simon MRN: [id number] Date of Birth: [birth year] Referring Provider:   Flowsheet Row INTENSIVE CARDIAC REHAB ORIENT from 11/13/2022 in [facility]  Referring Provider Peter Swaziland, MD       Metro Kung did not complete her rehab session.  Marleen reports having diarrhea. Advised not to exercise today. Khilee will return on Friday if she is symptom free.Thayer Headings RN BSN

## 2022-11-21 ENCOUNTER — Encounter (HOSPITAL_COMMUNITY)
Admission: RE | Admit: 2022-11-21 | Discharge: 2022-11-21 | Disposition: A | Discharge status: Home / Self Care | Payer: Medicare HMO | Source: Ambulatory Visit | Attending: Cardiology | Admitting: Cardiology

## 2022-11-21 DIAGNOSIS — I252 Old myocardial infarction: Secondary | ICD-10-CM | POA: Diagnosis not present

## 2022-11-21 DIAGNOSIS — I214 Non-ST elevation (NSTEMI) myocardial infarction: Secondary | ICD-10-CM

## 2022-11-21 DIAGNOSIS — Z5189 Encounter for other specified aftercare: Secondary | ICD-10-CM | POA: Diagnosis not present

## 2022-11-21 NOTE — Progress Notes (Signed)
Cardiac Individual Treatment Plan  Patient Details  Name: Beth Simon MRN: [id number] Date of Birth: 1940-07-05 Referring Provider:   Flowsheet Row INTENSIVE CARDIAC REHAB ORIENT from 11/13/2022 in [facility]  Referring Provider Peter [place], MD       Initial Encounter Date:  Flowsheet Row INTENSIVE CARDIAC REHAB ORIENT from 11/13/2022 in [facility]  Date 11/13/22       Visit Diagnosis: 10/17/22 NSTEMI (non-ST elevated myocardial infarction) [facility])  Patient's Home Medications on Admission:  Current Outpatient Medications:    aspirin EC 81 MG tablet, Take 1 tablet (81 mg total) by mouth daily. Swallow whole., Disp: 30 tablet, Rfl: 0   atorvastatin (LIPITOR) 40 MG tablet, Take 1 tablet (40 mg total) by mouth daily., Disp: 30 tablet, Rfl: 0   carvedilol (COREG) 6.25 MG tablet, Take 1 tablet (6.25 mg total) by mouth 2 (two) times daily., Disp: 180 tablet, Rfl: 3   clopidogrel (PLAVIX) 75 MG tablet, Take 1 tablet (75 mg total) by mouth daily with breakfast., Disp: 30 tablet, Rfl: 0   Coenzyme Q10 (COQ10 PO), Take 1 tablet by mouth every morning. , Disp: , Rfl:    Cyanocobalamin (VITAMIN B 12 PO), Take 1 tablet by mouth every morning. , Disp: , Rfl:    dapagliflozin propanediol (FARXIGA) 10 MG TABS tablet, Take 1 tablet (10 mg total) by mouth daily before breakfast., Disp: 30 tablet, Rfl: 1   denosumab (PROLIA) 60 MG/ML SOLN injection, Inject 60 mg into the skin every 6 (six) months. Administer in upper arm, thigh, or abdomen, Disp: , Rfl:    Flaxseed, Linseed, (FLAX SEEDS PO), Take 1 scoop by mouth every morning. Ground flaxseed in cereal., Disp: , Rfl:    hydrALAZINE (APRESOLINE) 25 MG tablet, Take 1 tablet (25 mg total) by mouth as needed (for blood pressure greater than 160/100. May take up to twice daily.)., Disp: 180 tablet, Rfl: 3   losartan (COZAAR) 25 MG tablet,  Take 1 tablet (25 mg total) by mouth daily for 30 doses., Disp: 90 tablet, Rfl: 3   Multiple Vitamins-Minerals (PRESERVISION AREDS) CAPS, Take 1 capsule by mouth daily., Disp: , Rfl:    OVER THE COUNTER MEDICATION, Take 1 tablet by mouth every morning. Astazanthin. (anti-oxidant), Disp: , Rfl:    VITAMIN D, CHOLECALCIFEROL, PO, Take 4,000 Int'l Units by mouth every morning. , Disp: , Rfl:   Past Medical History: Past Medical History:  Diagnosis Date   Adenomatous polyps 01/2016   Anemia    Breast cancer (HCC) 1990   bilateral mastectomy, adenoca breast-left MRM, reconstruction, chemo   Bronchitis last 2 weeks   saw dr Pete Glatter 02-28-2014, he said no antibiotics needed, nonproductive cough   Complication of anesthesia    Cystitis    cytoxen, had once or twice   Family history of breast cancer    History of breast cancer    History of radiation therapy 2/10, 2/12, 2/18, 2/24, 05/29/14   vaginal cuff/ 30 Gy/ 5 fx   Hypertension    Macular degeneration    Neck pain    taking physictal therapy last 2 weeks   Osteoporosis    On Prolia.   PONV (postoperative nausea and vomiting)    Shoulder pain    taking physical therapy for last few weeks   Uterine cancer (HCC) 03/21/2014   MLH1/PMS2 LOH   Uterine fibroid    VAIN I (  vaginal intraepithelial neoplasia grade I) 2018   positive HR HPV.   Vasovagal syncopes     Tobacco Use: Social History   Tobacco Use  Smoking Status Never  Smokeless Tobacco Never    Labs: Review Flowsheet       Latest Ref Rng & Units 10/07/2013 10/17/2022  Labs for ITP Cardiac and Pulmonary Rehab  Cholestrol 0 - 200 mg/dL - 161   LDL (calc) 0 - 99 mg/dL - 096   HDL-C >04 mg/dL - 78   Trlycerides <540 mg/dL - 79   Hemoglobin J8J 4.8 - 5.6 % 5.9  5.7   PH, Arterial 7.35 - 7.45 - 7.410   PCO2 arterial 32 - 48 mmHg - 33.2   Bicarbonate 20.0 - 28.0 mmol/L 20.0 - 28.0 mmol/L - 22.2  23.8  21.0   TCO2 22 - 32 mmol/L 22 - 32 mmol/L - 23  25  22  23     Acid-base deficit 0.0 - 2.0 mmol/L - 2.0  3.0   O2 Saturation % % - 64  63  94     Details       Multiple values from one day are sorted in reverse-chronological order         Capillary Blood Glucose: Lab Results  Component Value Date   GLUCAP 80 05/09/2014     Exercise Target Goals: Exercise Program Goal: Individual exercise prescription set using results from initial 6 min walk test and THRR while considering  patient's activity barriers and safety.   Exercise Prescription Goal: Initial exercise prescription builds to 30-45 minutes a day of aerobic activity, 2-3 days per week.  Home exercise guidelines will be given to patient during program as part of exercise prescription that the participant will acknowledge.  Activity Barriers & Risk Stratification:  Activity Barriers & Cardiac Risk Stratification - 11/13/22 1148       Activity Barriers & Cardiac Risk Stratification   Activity Barriers Decreased Ventricular Function;Chest Pain/Angina    Cardiac Risk Stratification High   <5 METs on            6 Minute Walk:  6 Minute Walk     Row Name 11/13/22 1145         6 Minute Walk   Phase Initial     Distance 480 feet     Walk Time 6 minutes     # of Rest Breaks 1  2:00-6:00     MPH 0.91     METS 0.83     RPE 13     Perceived Dyspnea  0     VO2 Peak 2.91     Symptoms Yes (comment)     Comments Chest pressure reported at 2 min mark. Test aborted, BP 190/72, 98% NSR. No other symptoms reported, pressure resolved once seated. RN Corrie Dandy made aware, contacted Dr. [place].     Resting HR 62 bpm     Resting BP 148/72     Resting Oxygen Saturation  97 %     Exercise Oxygen Saturation  during 6 min walk 98 %     Max Ex. HR 82 bpm     Max Ex. BP 190/72     2 Minute Post BP 188/70              Oxygen Initial Assessment:   Oxygen Re-Evaluation:   Oxygen Discharge (Final Oxygen Re-Evaluation):   Initial Exercise Prescription:  Initial Exercise  Prescription - 11/13/22 1100  Date of Initial Exercise RX and Referring Provider   Date 11/13/22    Referring Provider Peter [place], MD    Expected Discharge Date 02/04/23      NuStep   Level 1    SPM 60    Minutes 15    METs 1.8      Track   Laps 15    Minutes 15    METs 2      Prescription Details   Frequency (times per week) 3    Duration Progress to 30 minutes of continuous aerobic without signs/symptoms of physical distress      Intensity   THRR 40-80% of Max Heartrate 55-110    Ratings of Perceived Exertion 11-13    Perceived Dyspnea 0-4      Progression   Progression Continue progressive overload as per policy without signs/symptoms or physical distress.      Resistance Training   Training Prescription Yes    Weight 2    Reps 10-15             Perform Capillary Blood Glucose checks as needed.  Exercise Prescription Changes:   Exercise Prescription Changes     Row Name 11/21/22 1300             Response to Exercise   Blood Pressure (Admit) 140/72       Blood Pressure (Exercise) 154/72       Blood Pressure (Exit) 132/70       Heart Rate (Admit) 61 bpm       Heart Rate (Exercise) 82 bpm       Heart Rate (Exit) 59 bpm       Rating of Perceived Exertion (Exercise) 9       Symptoms None       Comments Pt's first day in the CRP2 program       Duration Continue with 30 min of aerobic exercise without signs/symptoms of physical distress.       Intensity THRR unchanged         Progression   Progression Continue to progress workloads to maintain intensity without signs/symptoms of physical distress.       Average METs 1.75         Resistance Training   Training Prescription Yes       Weight 2 lbs       Reps 10-15       Time 10 Minutes         Interval Training   Interval Training No         NuStep   Level 1       SPM 45       Minutes 15       METs 1.2         Track   Laps 10       Minutes 15       METs 2.28                 Exercise Comments:   Exercise Comments     Row Name 11/21/22 1402           Exercise Comments Pt's first day in the CRP2 program. Pt exercised today without complaints.                Exercise Goals and Review:   Exercise Goals     Row Name 11/13/22 [phone]             Exercise Goals  Increase Physical Activity Yes       Intervention Provide advice, education, support and counseling about physical activity/exercise needs.;Develop an individualized exercise prescription for aerobic and resistive training based on initial evaluation findings, risk stratification, comorbidities and participant's personal goals.       Expected Outcomes Short Term: Attend rehab on a regular basis to increase amount of physical activity.;Long Term: Exercising regularly at least 3-5 days a week.;Long Term: Add in home exercise to make exercise part of routine and to increase amount of physical activity.       Increase Strength and Stamina Yes       Intervention Provide advice, education, support and counseling about physical activity/exercise needs.;Develop an individualized exercise prescription for aerobic and resistive training based on initial evaluation findings, risk stratification, comorbidities and participant's personal goals.       Expected Outcomes Short Term: Perform resistance training exercises routinely during rehab and add in resistance training at home;Short Term: Increase workloads from initial exercise prescription for resistance, speed, and METs.;Long Term: Improve cardiorespiratory fitness, muscular endurance and strength as measured by increased METs and functional capacity ( )       Able to understand and use rate of perceived exertion (RPE) scale Yes       Intervention Provide education and explanation on how to use RPE scale       Expected Outcomes Short Term: Able to use RPE daily in rehab to express subjective intensity level;Long Term:  Able to use RPE to guide  intensity level when exercising independently       Knowledge and understanding of Target Heart Rate Range (THRR) Yes       Intervention Provide education and explanation of THRR including how the numbers were predicted and where they are located for reference       Expected Outcomes Short Term: Able to state/look up THRR;Short Term: Able to use daily as guideline for intensity in rehab;Long Term: Able to use THRR to govern intensity when exercising independently       Understanding of Exercise Prescription Yes       Intervention Provide education, explanation, and written materials on patient's individual exercise prescription       Expected Outcomes Short Term: Able to explain program exercise prescription;Long Term: Able to explain home exercise prescription to exercise independently                Exercise Goals Re-Evaluation :  Exercise Goals Re-Evaluation     Row Name 11/21/22 1401             Exercise Goal Re-Evaluation   Exercise Goals Review Increase Physical Activity;Increase Strength and Stamina;Able to understand and use rate of perceived exertion (RPE) scale;Knowledge and understanding of Target Heart Rate Range (THRR);Understanding of Exercise Prescription       Comments Pt's first day in the CRP2 program. Pt understands the THRR, RPE sclae and exercise Rx.       Expected Outcomes Will continue to monitor patient and progress exercise workloads as tolerated.                Discharge Exercise Prescription (Final Exercise Prescription Changes):  Exercise Prescription Changes - 11/21/22 1300       Response to Exercise   Blood Pressure (Admit) 140/72    Blood Pressure (Exercise) 154/72    Blood Pressure (Exit) 132/70    Heart Rate (Admit) 61 bpm    Heart Rate (Exercise) 82 bpm    Heart Rate (Exit) 59 bpm  Rating of Perceived Exertion (Exercise) 9    Symptoms None    Comments Pt's first day in the CRP2 program    Duration Continue with 30 min of aerobic  exercise without signs/symptoms of physical distress.    Intensity THRR unchanged      Progression   Progression Continue to progress workloads to maintain intensity without signs/symptoms of physical distress.    Average METs 1.75      Resistance Training   Training Prescription Yes    Weight 2 lbs    Reps 10-15    Time 10 Minutes      Interval Training   Interval Training No      NuStep   Level 1    SPM 45    Minutes 15    METs 1.2      Track   Laps 10    Minutes 15    METs 2.28             Nutrition:  Target Goals: Understanding of nutrition guidelines, daily intake of sodium 1500mg , cholesterol 200mg , calories 30% from fat and 7% or less from saturated fats, daily to have 5 or more servings of fruits and vegetables.  Biometrics:  Pre Biometrics - 11/13/22 0804       Pre Biometrics   Waist Circumference 34.5 inches    Hip Circumference 39 inches    Waist to Hip Ratio 0.88 %    Triceps Skinfold 26 mm    % Body Fat 39.6 %    Grip Strength 22 kg    Flexibility 12 in    Single Leg Stand 13.2 seconds              Nutrition Therapy Plan and Nutrition Goals:  Nutrition Therapy & Goals - 11/21/22 1304       Nutrition Therapy   Diet Heart Healthy Diet    Drug/Food Interactions Statins/Certain Fruits      Personal Nutrition Goals   Nutrition Goal Patient to identify strategies for reducing cardiovascular risk by attending the Pritikin education and nutrition series weekly.    Personal Goal #2 Patient to improve diet quality by using the plate method as a guide for meal planning to include lean protein/plant protein, fruits, vegetables, whole grains, nonfat dairy as part of a well-balanced diet   goal in progress.   Personal Goal #3 Patient to decrease sodium 1500mg  per day    Comments Norleen reports that she has already started making some dietary changes including eating three meals daily and reduced sugar/refined carbohydrate intake. She reports  weight loss of ~7# since initial NSTEMI hospitalization on 7/19 at 132#. She is motivated to continue to lose weight with goal weight of ~115#. She is currently using [employer] meal delivery services (14 days worth of meals). Patient will benefit from participation in intensive cardiac rehab for nutrition, exercise, and lifestyle modification.      Intervention Plan   Intervention Prescribe, educate and counsel regarding individualized specific dietary modifications aiming towards targeted core components such as weight, hypertension, lipid management, diabetes, heart failure and other comorbidities.;Nutrition handout(s) given to patient.    Expected Outcomes Short Term Goal: Understand basic principles of dietary content, such as calories, fat, sodium, cholesterol and nutrients.;Long Term Goal: Adherence to prescribed nutrition plan.             Nutrition Assessments:  Nutrition Assessments - 11/20/22 1448       Rate Your Plate Scores   Pre Score 76  MEDIFICTS Score Key: ?70 Need to make dietary changes  40-70 Heart Healthy Diet ? 40 Therapeutic Level Cholesterol Diet   Flowsheet Row INTENSIVE CARDIAC REHAB from 11/19/2022 in [facility]  Picture Your Plate Total Score on Admission 76      Picture Your Plate Scores: <10 Unhealthy dietary pattern with much room for improvement. 41-50 Dietary pattern unlikely to meet recommendations for good health and room for improvement. 51-60 More healthful dietary pattern, with some room for improvement.  >60 Healthy dietary pattern, although there may be some specific behaviors that could be improved.    Nutrition Goals Re-Evaluation:  Nutrition Goals Re-Evaluation     Row Name 11/21/22 1304             Goals   Current Weight 127 lb 3.3 oz (57.7 kg)       Comment lipoproteinA 104, A1c 5.7, cholesterol 205, LDL 111       Expected Outcome Shequitta reports that she  has already started making some dietary changes including eating three meals daily and reduced sugar/refined carbohydrate intake. She reports weight loss of ~7# since initial NSTEMI hospitalization on 7/19 at 132#. She is motivated to continue to lose weight with goal weight of ~115#. She is currently using [employer] meal delivery services (14 days worth of meals). Patient will benefit from participation in intensive cardiac rehab for nutrition, exercise, and lifestyle modification.                Nutrition Goals Re-Evaluation:  Nutrition Goals Re-Evaluation     Row Name 11/21/22 1304             Goals   Current Weight 127 lb 3.3 oz (57.7 kg)       Comment lipoproteinA 104, A1c 5.7, cholesterol 205, LDL 111       Expected Outcome Mindee reports that she has already started making some dietary changes including eating three meals daily and reduced sugar/refined carbohydrate intake. She reports weight loss of ~7# since initial NSTEMI hospitalization on 7/19 at 132#. She is motivated to continue to lose weight with goal weight of ~115#. She is currently using [employer] meal delivery services (14 days worth of meals). Patient will benefit from participation in intensive cardiac rehab for nutrition, exercise, and lifestyle modification.                Nutrition Goals Discharge (Final Nutrition Goals Re-Evaluation):  Nutrition Goals Re-Evaluation - 11/21/22 1304       Goals   Current Weight 127 lb 3.3 oz (57.7 kg)    Comment lipoproteinA 104, A1c 5.7, cholesterol 205, LDL 111    Expected Outcome Marialuisa reports that she has already started making some dietary changes including eating three meals daily and reduced sugar/refined carbohydrate intake. She reports weight loss of ~7# since initial NSTEMI hospitalization on 7/19 at 132#. She is motivated to continue to lose weight with goal weight of ~115#. She is currently using [employer] meal delivery services (14 days worth of meals). Patient  will benefit from participation in intensive cardiac rehab for nutrition, exercise, and lifestyle modification.             Psychosocial: Target Goals: Acknowledge presence or absence of significant depression and/or stress, maximize coping skills, provide positive support system. Participant is able to verbalize types and ability to use techniques and skills needed for reducing stress and depression.  Initial Review & Psychosocial Screening:  Initial Psych  Review & Screening - 11/13/22 1150       Initial Review   Current issues with None Identified      Family Dynamics   Good Support System? Yes   Rechy has her son and friends for support     Barriers   Psychosocial barriers to participate in program There are no identifiable barriers or psychosocial needs.      Screening Interventions   Interventions Encouraged to exercise;Provide feedback about the scores to participant    Expected Outcomes Short Term goal: Identification and review with participant of any Quality of Life or Depression concerns found by scoring the questionnaire.;Long Term goal: The participant improves quality of Life and PHQ9 Scores as seen by post scores and/or verbalization of changes             Quality of Life Scores:  Quality of Life - 11/13/22 1151       Quality of Life   Select Quality of Life      Quality of Life Scores   Health/Function Pre 28.18 %    Socioeconomic Pre 27.75 %    Psych/Spiritual Pre 28.93 %    Family Pre 27 %    GLOBAL Pre 28.11 %            Scores of 19 and below usually indicate a poorer quality of life in these areas.  A difference of  2-3 points is a clinically meaningful difference.  A difference of 2-3 points in the total score of the Quality of Life Index has been associated with significant improvement in overall quality of life, self-image, physical symptoms, and general health in studies assessing change in quality of life.  PHQ-9: Review Flowsheet   More data exists      11/13/2022 10/16/2017 10/10/2016 10/05/2015 01/05/2015  Depression screen PHQ 2/9  Decreased Interest 1 0 0 0 1  Down, Depressed, Hopeless 0 0 0 0 1  PHQ - 2 Score 1 0 0 0 2  Altered sleeping 0 - - - -  Tired, decreased energy 1 - - - -  Change in appetite 1 - - - -  Feeling bad or failure about yourself  0 - - - -  Trouble concentrating 0 - - - -  Moving slowly or fidgety/restless 0 - - - -  Suicidal thoughts 0 - - - -  PHQ-9 Score 3 - - - -  Difficult doing work/chores Not difficult at all - - - -    Details           Interpretation of Total Score  Total Score Depression Severity:  1-4 = Minimal depression, 5-9 = Mild depression, 10-14 = Moderate depression, 15-19 = Moderately severe depression, 20-27 = Severe depression   Psychosocial Evaluation and Intervention:   Psychosocial Re-Evaluation:  Psychosocial Re-Evaluation     Row Name 11/21/22 1435             Psychosocial Re-Evaluation   Current issues with None Identified       Comments No psychosocial needs identified, no interventions needed       Interventions Encouraged to attend Cardiac Rehabilitation for the exercise       Continue Psychosocial Services  No Follow up required                Psychosocial Discharge (Final Psychosocial Re-Evaluation):  Psychosocial Re-Evaluation - 11/21/22 1435       Psychosocial Re-Evaluation   Current issues with None Identified  Comments No psychosocial needs identified, no interventions needed    Interventions Encouraged to attend Cardiac Rehabilitation for the exercise    Continue Psychosocial Services  No Follow up required             Vocational Rehabilitation: Provide vocational rehab assistance to qualifying candidates.   Vocational Rehab Evaluation & Intervention:  Vocational Rehab - 11/13/22 0824       Initial Vocational Rehab Evaluation & Intervention   Assessment shows need for Vocational Rehabilitation No   Saveah is  retired            Education: Education Goals: Education classes will be provided on a weekly basis, covering required topics. Participant will state understanding/return demonstration of topics presented.    Education     Row Name 11/21/22 1300     Education   Cardiac Education Topics Pritikin   Hospital doctor Education   General Education Heart Disease Risk Reduction   Instruction Review Code 1- Verbalizes Understanding   Class Start Time 1146   Class Stop Time 1223   Class Time Calculation (min) 37 min            Core Videos: Exercise    Move It!  Clinical staff conducted group or individual video education with verbal and written material and guidebook.  Patient learns the recommended Pritikin exercise program. Exercise with the goal of living a long, healthy life. Some of the health benefits of exercise include controlled diabetes, healthier blood pressure levels, improved cholesterol levels, improved heart and lung capacity, improved sleep, and better body composition. Everyone should speak with their doctor before starting or changing an exercise routine.  Biomechanical Limitations Clinical staff conducted group or individual video education with verbal and written material and guidebook.  Patient learns how biomechanical limitations can impact exercise and how we can mitigate and possibly overcome limitations to have an impactful and balanced exercise routine.  Body Composition Clinical staff conducted group or individual video education with verbal and written material and guidebook.  Patient learns that body composition (ratio of muscle mass to fat mass) is a key component to assessing overall fitness, rather than body weight alone. Increased fat mass, especially visceral belly fat, can put [place] at increased risk for metabolic syndrome, type 2 diabetes, heart disease, and even death. It is  recommended to combine diet and exercise (cardiovascular and resistance training) to improve your body composition. Seek guidance from your physician and exercise physiologist before implementing an exercise routine.  Exercise Action Plan Clinical staff conducted group or individual video education with verbal and written material and guidebook.  Patient learns the recommended strategies to achieve and enjoy long-term exercise adherence, including variety, self-motivation, self-efficacy, and positive decision making. Benefits of exercise include fitness, good health, weight management, more energy, better sleep, less stress, and overall well-being.  Medical   Heart Disease Risk Reduction Clinical staff conducted group or individual video education with verbal and written material and guidebook.  Patient learns our heart is our most vital organ as it circulates oxygen, nutrients, white blood cells, and hormones throughout the entire body, and carries waste away. Data supports a plant-based eating plan like the Pritikin Program for its effectiveness in slowing progression of and reversing heart disease. The video provides a number of recommendations to address heart disease.   Metabolic Syndrome and Belly Fat  Clinical staff conducted group or individual video education  with verbal and written material and guidebook.  Patient learns what metabolic syndrome is, how it leads to heart disease, and how one can reverse it and keep it from coming back. You have metabolic syndrome if you have 3 of the following 5 criteria: abdominal obesity, high blood pressure, high triglycerides, low HDL cholesterol, and high blood sugar.  Hypertension and Heart Disease Clinical staff conducted group or individual video education with verbal and written material and guidebook.  Patient learns that high blood pressure, or hypertension, is very common in the [place]. Hypertension is largely due to excessive salt  intake, but other important risk factors include being overweight, physical inactivity, drinking too much alcohol, smoking, and not eating enough potassium from fruits and vegetables. High blood pressure is a leading risk factor for heart attack, stroke, congestive heart failure, dementia, kidney failure, and premature death. Long-term effects of excessive salt intake include stiffening of the arteries and thickening of heart muscle and organ damage. Recommendations include ways to reduce hypertension and the risk of heart disease.  Diseases of Our Time - Focusing on Diabetes Clinical staff conducted group or individual video education with verbal and written material and guidebook.  Patient learns why the best way to stop diseases of our time is prevention, through food and other lifestyle changes. Medicine (such as prescription pills and surgeries) is often only a Band-Aid on the problem, not a long-term solution. Most common diseases of our time include obesity, type 2 diabetes, hypertension, heart disease, and cancer. The Pritikin Program is recommended and has been proven to help reduce, reverse, and/or prevent the damaging effects of metabolic syndrome.  Nutrition   Overview of the Pritikin Eating Plan  Clinical staff conducted group or individual video education with verbal and written material and guidebook.  Patient learns about the Pritikin Eating Plan for disease risk reduction. The Pritikin Eating Plan emphasizes a wide variety of unrefined, minimally-processed carbohydrates, like fruits, vegetables, whole grains, and legumes. Go, Caution, and Stop food choices are explained. Plant-based and lean animal proteins are emphasized. Rationale provided for low sodium intake for blood pressure control, low added sugars for blood sugar stabilization, and low added fats and oils for coronary artery disease risk reduction and weight management.  Calorie Density  Clinical staff conducted group or  individual video education with verbal and written material and guidebook.  Patient learns about calorie density and how it impacts the Pritikin Eating Plan. Knowing the characteristics of the food you choose will help you decide whether those foods will lead to weight gain or weight loss, and whether you want to consume more or less of them. Weight loss is usually a side effect of the Pritikin Eating Plan because of its focus on low calorie-dense foods.  Label Reading  Clinical staff conducted group or individual video education with verbal and written material and guidebook.  Patient learns about the Pritikin recommended label reading guidelines and corresponding recommendations regarding calorie density, added sugars, sodium content, and whole grains.  Dining Out - Part 1  Clinical staff conducted group or individual video education with verbal and written material and guidebook.  Patient learns that restaurant meals can be sabotaging because they can be so high in calories, fat, sodium, and/or sugar. Patient learns recommended strategies on how to positively address this and avoid unhealthy pitfalls.  Facts on Fats  Clinical staff conducted group or individual video education with verbal and written material and guidebook.  Patient learns that lifestyle modifications can be  just as effective, if not more so, as many medications for lowering your risk of heart disease. A Pritikin lifestyle can help to reduce your risk of inflammation and atherosclerosis (cholesterol build-up, or plaque, in the artery walls). Lifestyle interventions such as dietary choices and physical activity address the cause of atherosclerosis. A review of the types of fats and their impact on blood cholesterol levels, along with dietary recommendations to reduce fat intake is also included.  Nutrition Action Plan  Clinical staff conducted group or individual video education with verbal and written material and guidebook.   Patient learns how to incorporate Pritikin recommendations into their lifestyle. Recommendations include planning and keeping personal health goals in mind as an important part of their success.  Healthy Mind-Set    Healthy Minds, Bodies, Hearts  Clinical staff conducted group or individual video education with verbal and written material and guidebook.  Patient learns how to identify when they are stressed. Video will discuss the impact of that stress, as well as the many benefits of stress management. Patient will also be introduced to stress management techniques. The way we think, act, and feel has an impact on our hearts.  How Our Thoughts Can Heal Our Hearts  Clinical staff conducted group or individual video education with verbal and written material and guidebook.  Patient learns that negative thoughts can cause depression and anxiety. This can result in negative lifestyle behavior and serious health problems. Cognitive behavioral therapy is an effective method to help control our thoughts in order to change and improve our emotional outlook.  Additional Videos:  Exercise    Improving Performance  Clinical staff conducted group or individual video education with verbal and written material and guidebook.  Patient learns to use a non-linear approach by alternating intensity levels and lengths of time spent exercising to help burn more calories and lose more body fat. Cardiovascular exercise helps improve heart health, metabolism, hormonal balance, blood sugar control, and recovery from fatigue. Resistance training improves strength, endurance, balance, coordination, reaction time, metabolism, and muscle mass. Flexibility exercise improves circulation, posture, and balance. Seek guidance from your physician and exercise physiologist before implementing an exercise routine and learn your capabilities and proper form for all exercise.  Introduction to Yoga  Clinical staff conducted group or  individual video education with verbal and written material and guidebook.  Patient learns about yoga, a discipline of the coming together of mind, breath, and body. The benefits of yoga include improved flexibility, improved range of motion, better posture and core strength, increased lung function, weight loss, and positive self-image. Yoga's heart health benefits include lowered blood pressure, healthier heart rate, decreased cholesterol and triglyceride levels, improved immune function, and reduced stress. Seek guidance from your physician and exercise physiologist before implementing an exercise routine and learn your capabilities and proper form for all exercise.  Medical   Aging: Enhancing Your Quality of Life  Clinical staff conducted group or individual video education with verbal and written material and guidebook.  Patient learns key strategies and recommendations to stay in good physical health and enhance quality of life, such as prevention strategies, having an advocate, securing a Health Care Proxy and Power of Attorney, and keeping a list of medications and system for tracking them. It also discusses how to avoid risk for bone loss.  Biology of Weight Control  Clinical staff conducted group or individual video education with verbal and written material and guidebook.  Patient learns that weight gain occurs because we consume more  calories than we burn (eating more, moving less). Even if your body weight is normal, you may have higher ratios of fat compared to muscle mass. Too much body fat puts you at increased risk for cardiovascular disease, heart attack, stroke, type 2 diabetes, and obesity-related cancers. In addition to exercise, following the Pritikin Eating Plan can help reduce your risk.  Decoding Lab Results  Clinical staff conducted group or individual video education with verbal and written material and guidebook.  Patient learns that lab test reflects one measurement whose  values change over time and are influenced by many factors, including medication, stress, sleep, exercise, food, hydration, pre-existing medical conditions, and more. It is recommended to use the knowledge from this video to become more involved with your lab results and evaluate your numbers to speak with your doctor.   Diseases of Our Time - Overview  Clinical staff conducted group or individual video education with verbal and written material and guidebook.  Patient learns that according to the CDC, 50% to 70% of chronic diseases (such as obesity, type 2 diabetes, elevated lipids, hypertension, and heart disease) are avoidable through lifestyle improvements including healthier food choices, listening to satiety cues, and increased physical activity.  Sleep Disorders Clinical staff conducted group or individual video education with verbal and written material and guidebook.  Patient learns how good quality and duration of sleep are important to overall health and well-being. Patient also learns about sleep disorders and how they impact health along with recommendations to address them, including discussing with a physician.  Nutrition  Dining Out - Part 2 Clinical staff conducted group or individual video education with verbal and written material and guidebook.  Patient learns how to plan ahead and communicate in order to maximize their dining experience in a healthy and nutritious manner. Included are recommended food choices based on the type of restaurant the patient is visiting.   Fueling a Banker conducted group or individual video education with verbal and written material and guidebook.  There is a strong connection between our food choices and our health. Diseases like obesity and type 2 diabetes are very prevalent and are in large-part due to lifestyle choices. The Pritikin Eating Plan provides plenty of food and hunger-curbing satisfaction. It is easy to follow,  affordable, and helps reduce health risks.  Menu Workshop  Clinical staff conducted group or individual video education with verbal and written material and guidebook.  Patient learns that restaurant meals can sabotage health goals because they are often packed with calories, fat, sodium, and sugar. Recommendations include strategies to plan ahead and to communicate with the manager, chef, or server to help order a healthier meal.  Planning Your Eating Strategy  Clinical staff conducted group or individual video education with verbal and written material and guidebook.  Patient learns about the Pritikin Eating Plan and its benefit of reducing the risk of disease. The Pritikin Eating Plan does not focus on calories. Instead, it emphasizes high-quality, nutrient-rich foods. By knowing the characteristics of the foods, we choose, we can determine their calorie density and make informed decisions.  Targeting Your Nutrition Priorities  Clinical staff conducted group or individual video education with verbal and written material and guidebook.  Patient learns that lifestyle habits have a tremendous impact on disease risk and progression. This video provides eating and physical activity recommendations based on your personal health goals, such as reducing LDL cholesterol, losing weight, preventing or controlling type 2 diabetes, and reducing high  blood pressure.  Vitamins and Minerals  Clinical staff conducted group or individual video education with verbal and written material and guidebook.  Patient learns different ways to obtain key vitamins and minerals, including through a recommended healthy diet. It is important to discuss all supplements you take with your doctor.   Healthy Mind-Set    Smoking Cessation  Clinical staff conducted group or individual video education with verbal and written material and guidebook.  Patient learns that cigarette smoking and tobacco addiction pose a serious health  risk which affects millions of people. Stopping smoking will significantly reduce the risk of heart disease, lung disease, and many forms of cancer. Recommended strategies for quitting are covered, including working with your doctor to develop a successful plan.  Culinary   Becoming a Set designer conducted group or individual video education with verbal and written material and guidebook.  Patient learns that cooking at home can be healthy, cost-effective, quick, and puts them in control. Keys to cooking healthy recipes will include looking at your recipe, assessing your equipment needs, planning ahead, making it simple, choosing cost-effective seasonal ingredients, and limiting the use of added fats, salts, and sugars.  Cooking - Breakfast and Snacks  Clinical staff conducted group or individual video education with verbal and written material and guidebook.  Patient learns how important breakfast is to satiety and nutrition through the entire day. Recommendations include key foods to eat during breakfast to help stabilize blood sugar levels and to prevent overeating at meals later in the day. Planning ahead is also a key component.  Cooking - Educational psychologist conducted group or individual video education with verbal and written material and guidebook.  Patient learns eating strategies to improve overall health, including an approach to cook more at home. Recommendations include thinking of animal protein as a side on your plate rather than center stage and focusing instead on lower calorie dense options like vegetables, fruits, whole grains, and plant-based proteins, such as beans. Making sauces in large quantities to freeze for later and leaving the skin on your vegetables are also recommended to maximize your experience.  Cooking - Healthy Salads and Dressing Clinical staff conducted group or individual video education with verbal and written material and  guidebook.  Patient learns that vegetables, fruits, whole grains, and legumes are the foundations of the Pritikin Eating Plan. Recommendations include how to incorporate each of these in flavorful and healthy salads, and how to create homemade salad dressings. Proper handling of ingredients is also covered. Cooking - Soups and [employer] - Soups and Desserts Clinical staff conducted group or individual video education with verbal and written material and guidebook.  Patient learns that Pritikin soups and desserts make for easy, nutritious, and delicious snacks and meal components that are low in sodium, fat, sugar, and calorie density, while high in vitamins, minerals, and filling fiber. Recommendations include simple and healthy ideas for soups and desserts.   Overview     The Pritikin Solution Program Overview Clinical staff conducted group or individual video education with verbal and written material and guidebook.  Patient learns that the results of the Pritikin Program have been documented in more than 100 articles published in peer-reviewed journals, and the benefits include reducing risk factors for (and, in some cases, even reversing) high cholesterol, high blood pressure, type 2 diabetes, obesity, and more! An overview of the three key pillars of the Pritikin Program will be covered: eating well, doing  regular exercise, and having a healthy mind-set.  WORKSHOPS  Exercise: Exercise Basics: Building Your Action Plan Clinical staff led group instruction and group discussion with PowerPoint presentation and patient guidebook. To enhance the learning environment the use of posters, models and videos may be added. At the conclusion of this workshop, patients will comprehend the difference between physical activity and exercise, as well as the benefits of incorporating both, into their routine. Patients will understand the FITT (Frequency, Intensity, Time, and Type) principle and how to  use it to build an exercise action plan. In addition, safety concerns and other considerations for exercise and cardiac rehab will be addressed by the presenter. The purpose of this lesson is to promote a comprehensive and effective weekly exercise routine in order to improve patients' overall level of fitness.   Managing Heart Disease: Your Path to a Healthier Heart Clinical staff led group instruction and group discussion with PowerPoint presentation and patient guidebook. To enhance the learning environment the use of posters, models and videos may be added.At the conclusion of this workshop, patients will understand the anatomy and physiology of the heart. Additionally, they will understand how Pritikin's three pillars impact the risk factors, the progression, and the management of heart disease.  The purpose of this lesson is to provide a high-level overview of the heart, heart disease, and how the Pritikin lifestyle positively impacts risk factors.  Exercise Biomechanics Clinical staff led group instruction and group discussion with PowerPoint presentation and patient guidebook. To enhance the learning environment the use of posters, models and videos may be added. Patients will learn how the structural parts of their bodies function and how these functions impact their daily activities, movement, and exercise. Patients will learn how to promote a neutral spine, learn how to manage pain, and identify ways to improve their physical movement in order to promote healthy living. The purpose of this lesson is to expose patients to common physical limitations that impact physical activity. Participants will learn practical ways to adapt and manage aches and pains, and to minimize their effect on regular exercise. Patients will learn how to maintain good posture while sitting, walking, and lifting.  Balance Training and Fall Prevention  Clinical staff led group instruction and group discussion  with PowerPoint presentation and patient guidebook. To enhance the learning environment the use of posters, models and videos may be added. At the conclusion of this workshop, patients will understand the importance of their sensorimotor skills (vision, proprioception, and the vestibular system) in maintaining their ability to balance as they age. Patients will apply a variety of balancing exercises that are appropriate for their current level of function. Patients will understand the common causes for poor balance, possible solutions to these problems, and ways to modify their physical environment in order to minimize their fall risk. The purpose of this lesson is to teach patients about the importance of maintaining balance as they age and ways to minimize their risk of falling.  WORKSHOPS   Nutrition:  Fueling a Ship broker led group instruction and group discussion with PowerPoint presentation and patient guidebook. To enhance the learning environment the use of posters, models and videos may be added. Patients will review the foundational principles of the Pritikin Eating Plan and understand what constitutes a serving size in each of the food groups. Patients will also learn Pritikin-friendly foods that are better choices when away from home and review make-ahead meal and snack options. Calorie density will be reviewed and applied  to three nutrition priorities: weight maintenance, weight loss, and weight gain. The purpose of this lesson is to reinforce (in a group setting) the key concepts around what patients are recommended to eat and how to apply these guidelines when away from home by planning and selecting Pritikin-friendly options. Patients will understand how calorie density may be adjusted for different weight management goals.  Mindful Eating  Clinical staff led group instruction and group discussion with PowerPoint presentation and patient guidebook. To enhance the  learning environment the use of posters, models and videos may be added. Patients will briefly review the concepts of the Pritikin Eating Plan and the importance of low-calorie dense foods. The concept of mindful eating will be introduced as well as the importance of paying attention to internal hunger signals. Triggers for non-hunger eating and techniques for dealing with triggers will be explored. The purpose of this lesson is to provide patients with the opportunity to review the basic principles of the Pritikin Eating Plan, discuss the value of eating mindfully and how to measure internal cues of hunger and fullness using the Hunger Scale. Patients will also discuss reasons for non-hunger eating and learn strategies to use for controlling emotional eating.  Targeting Your Nutrition Priorities Clinical staff led group instruction and group discussion with PowerPoint presentation and patient guidebook. To enhance the learning environment the use of posters, models and videos may be added. Patients will learn how to determine their genetic susceptibility to disease by reviewing their family history. Patients will gain insight into the importance of diet as part of an overall healthy lifestyle in mitigating the impact of genetics and other environmental insults. The purpose of this lesson is to provide patients with the opportunity to assess their personal nutrition priorities by looking at their family history, their own health history and current risk factors. Patients will also be able to discuss ways of prioritizing and modifying the Pritikin Eating Plan for their highest risk areas  Menu  Clinical staff led group instruction and group discussion with PowerPoint presentation and patient guidebook. To enhance the learning environment the use of posters, models and videos may be added. Using menus brought in from [employer], or printed from [employer], patients will apply the Pritikin dining out  guidelines that were presented in the [employer] video. Patients will also be able to practice these guidelines in a variety of provided scenarios. The purpose of this lesson is to provide patients with the opportunity to practice hands-on learning of the Pritikin Dining Out guidelines with actual menus and practice scenarios.  Label Reading Clinical staff led group instruction and group discussion with PowerPoint presentation and patient guidebook. To enhance the learning environment the use of posters, models and videos may be added. Patients will review and discuss the Pritikin label reading guidelines presented in Pritikin's Label Reading Educational series video. Using fool labels brought in from local grocery stores and markets, patients will apply the label reading guidelines and determine if the packaged food meet the Pritikin guidelines. The purpose of this lesson is to provide patients with the opportunity to review, discuss, and practice hands-on learning of the Pritikin Label Reading guidelines with actual packaged food labels. Cooking School  Pritikin's [employer] are designed to teach patients ways to prepare quick, simple, and affordable recipes at home. The importance of nutrition's role in chronic disease risk reduction is reflected in its emphasis in the overall Pritikin program. By learning how to prepare essential core Pritikin  Eating Plan recipes, patients will increase control over what they eat; be able to customize the flavor of foods without the use of added salt, sugar, or fat; and improve the quality of the food they consume. By learning a set of core recipes which are easily assembled, quickly prepared, and affordable, patients are more likely to prepare more healthy foods at home. These workshops focus on convenient breakfasts, simple entres, side dishes, and desserts which can be prepared with minimal effort and are consistent with nutrition  recommendations for cardiovascular risk reduction. Cooking [employer] are taught by a Armed forces logistics/support/administrative officer (RD) who has been trained by the [employer]. The chef or RD has a clear understanding of the importance of minimizing - if not completely eliminating - added fat, sugar, and sodium in recipes. Throughout the series of Cooking School Workshop sessions, patients will learn about healthy ingredients and efficient methods of cooking to build confidence in their capability to prepare    Cooking School weekly topics:  Adding Flavor- Sodium-Free  Fast and Healthy Breakfasts  Powerhouse Plant-Based Proteins  Satisfying Salads and Dressings  Simple Sides and Sauces  International Cuisine-Spotlight on the [employer] Zones  Delicious Desserts  Savory Soups  [employer] - Meals in a Astronomer Appetizers and Snacks  Comforting Weekend Breakfasts  One-Pot Wonders   Fast Evening Meals  Landscape architect Your [employer] Plate  WORKSHOPS   Healthy Mindset (Psychosocial):  Focused Goals, Sustainable Changes Clinical staff led group instruction and group discussion with PowerPoint presentation and patient guidebook. To enhance the learning environment the use of posters, models and videos may be added. Patients will be able to apply effective goal setting strategies to establish at least one personal goal, and then take consistent, meaningful action toward that goal. They will learn to identify common barriers to achieving personal goals and develop strategies to overcome them. Patients will also gain an understanding of how our mind-set can impact our ability to achieve goals and the importance of cultivating a positive and growth-oriented mind-set. The purpose of this lesson is to provide patients with a deeper understanding of how to set and achieve personal goals, as well as the tools and strategies needed to overcome common obstacles which may arise along  the way.  From Head to Heart: The Power of a Healthy Outlook  Clinical staff led group instruction and group discussion with PowerPoint presentation and patient guidebook. To enhance the learning environment the use of posters, models and videos may be added. Patients will be able to recognize and describe the impact of emotions and mood on physical health. They will discover the importance of self-care and explore self-care practices which may work for them. Patients will also learn how to utilize the 4 C's to cultivate a healthier outlook and better manage stress and challenges. The purpose of this lesson is to demonstrate to patients how a healthy outlook is an essential part of maintaining good health, especially as they continue their cardiac rehab journey.  Healthy Sleep for a Healthy Heart Clinical staff led group instruction and group discussion with PowerPoint presentation and patient guidebook. To enhance the learning environment the use of posters, models and videos may be added. At the conclusion of this workshop, patients will be able to demonstrate knowledge of the importance of sleep to overall health, well-being, and quality of life. They will understand the symptoms of, and treatments for, common sleep disorders. Patients will also be able to  identify daytime and nighttime behaviors which impact sleep, and they will be able to apply these tools to help manage sleep-related challenges. The purpose of this lesson is to provide patients with a general overview of sleep and outline the importance of quality sleep. Patients will learn about a few of the most common sleep disorders. Patients will also be introduced to the concept of "sleep hygiene," and discover ways to self-manage certain sleeping problems through simple daily behavior changes. Finally, the workshop will motivate patients by clarifying the links between quality sleep and their goals of heart-healthy living.   Recognizing and  Reducing Stress Clinical staff led group instruction and group discussion with PowerPoint presentation and patient guidebook. To enhance the learning environment the use of posters, models and videos may be added. At the conclusion of this workshop, patients will be able to understand the types of stress reactions, differentiate between acute and chronic stress, and recognize the impact that chronic stress has on their health. They will also be able to apply different coping mechanisms, such as reframing negative self-talk. Patients will have the opportunity to practice a variety of stress management techniques, such as deep abdominal breathing, progressive muscle relaxation, and/or guided imagery.  The purpose of this lesson is to educate patients on the role of stress in their lives and to provide healthy techniques for coping with it.  Learning Barriers/Preferences:  Learning Barriers/Preferences - 11/13/22 6295       Learning Barriers/Preferences   Learning Barriers None    Learning Preferences Group Instruction;Individual Instruction;Skilled Demonstration;Written Material;Pictoral             Education Topics:  Knowledge Questionnaire Score:  Knowledge Questionnaire Score - 11/13/22 0840       Knowledge Questionnaire Score   Pre Score 21/24             Core Components/Risk Factors/Patient Goals at Admission:  Personal Goals and Risk Factors at Admission - 11/13/22 0821       Core Components/Risk Factors/Patient Goals on Admission    Weight Management Yes;Weight Maintenance    Intervention Weight Management: Develop a combined nutrition and exercise program designed to reach desired caloric intake, while maintaining appropriate intake of nutrient and fiber, sodium and fats, and appropriate energy expenditure required for the weight goal.;Weight Management: Provide education and appropriate resources to help participant work on and attain dietary goals.    Expected Outcomes  Short Term: Continue to assess and modify interventions until short term weight is achieved;Weight Maintenance: Understanding of the daily nutrition guidelines, which includes 25-35% calories from fat, 7% or less cal from saturated fats, less than 200mg  cholesterol, less than 1.5gm of sodium, & 5 or more servings of fruits and vegetables daily;Understanding recommendations for meals to include 15-35% energy as protein, 25-35% energy from fat, 35-60% energy from carbohydrates, less than 200mg  of dietary cholesterol, 20-35 gm of total fiber daily;Understanding of distribution of calorie intake throughout the day with the consumption of 4-5 meals/snacks    Heart Failure Yes    Intervention Provide a combined exercise and nutrition program that is supplemented with education, support and counseling about heart failure. Directed toward relieving symptoms such as shortness of breath, decreased exercise tolerance, and extremity edema.    Expected Outcomes Improve functional capacity of life;Short term: Attendance in program 2-3 days a week with increased exercise capacity. Reported lower sodium intake. Reported increased fruit and vegetable intake. Reports medication compliance.;Short term: Daily weights obtained and reported for increase. Utilizing diuretic protocols set  by physician.;Long term: Adoption of self-care skills and reduction of barriers for early signs and symptoms recognition and intervention leading to self-care maintenance.    Hypertension Yes    Intervention Provide education on lifestyle modifcations including regular physical activity/exercise, weight management, moderate sodium restriction and increased consumption of fresh fruit, vegetables, and low fat dairy, alcohol moderation, and smoking cessation.;Monitor prescription use compliance.    Expected Outcomes Short Term: Continued assessment and intervention until BP is < 140/93mm HG in hypertensive participants. < 130/52mm HG in hypertensive  participants with diabetes, heart failure or chronic kidney disease.;Long Term: Maintenance of blood pressure at goal levels.    Lipids Yes    Intervention Provide education and support for participant on nutrition & aerobic/resistive exercise along with prescribed medications to achieve LDL 70mg , HDL >40mg .    Expected Outcomes Short Term: Participant states understanding of desired cholesterol values and is compliant with medications prescribed. Participant is following exercise prescription and nutrition guidelines.;Long Term: Cholesterol controlled with medications as prescribed, with individualized exercise RX and with personalized nutrition plan. Value goals: LDL < 70mg , HDL > 40 mg.             Core Components/Risk Factors/Patient Goals Review:   Goals and Risk Factor Review     Row Name 11/21/22 1437             Core Components/Risk Factors/Patient Goals Review   Personal Goals Review Weight Management/Obesity;Heart Failure;Hypertension;Lipids       Review Karolyne started cardiac rehab on 11/21/22 and did well with exercise. Vital signs were stable.       Expected Outcomes Merrideth will continue to participate in cardiac rehab for exercise, nutrition and lifestyle modifications                Core Components/Risk Factors/Patient Goals at Discharge (Final Review):   Goals and Risk Factor Review - 11/21/22 1437       Core Components/Risk Factors/Patient Goals Review   Personal Goals Review Weight Management/Obesity;Heart Failure;Hypertension;Lipids    Review Rubia started cardiac rehab on 11/21/22 and did well with exercise. Vital signs were stable.    Expected Outcomes Shanica will continue to participate in cardiac rehab for exercise, nutrition and lifestyle modifications             ITP Comments:  ITP Comments     Row Name 11/13/22 0805 11/21/22 1433         ITP Comments Armanda Magic, MD: Medical Director.  Introduction to the [facility]  Program/Intensive Cardiac Rehab.  Initial orientation packet reviewed with the patient. 30 day ITP review. Christyana started cardiac rehab on 11/21/22 and did well with exercise.               Comments: See ITP comments.Thayer Headings RN BSN

## 2022-11-21 NOTE — Progress Notes (Signed)
Daily Session Note  Patient Details  Name: Beth Simon MRN: [id number] Date of Birth: Jul 06, 1940 Referring Provider:   Flowsheet Row INTENSIVE CARDIAC REHAB ORIENT from 11/13/2022 in [facility]  Referring Provider Peter Swaziland, MD       Encounter Date: 11/21/2022  Check In:  Session Check In - 11/21/22 1236       Check-In   Supervising physician immediately available to respond to emergencies [facility] MD immediately available    Physician(s) Neila Gear, NP    Location [facility]    Staff Present Valinda Party, MS, Exercise Physiologist;David Manus Gunning, MS, ACSM-CEP, CCRP, Exercise Physiologist;Mary Gerre Scull, RN, Zachery Conch, MS, ACSM-CEP, Exercise Physiologist;Budd Freiermuth, RN, BSN    Virtual Visit No    Medication changes reported     No    Fall or balance concerns reported    No    Tobacco Cessation No Change    Warm-up and Cool-down Performed as group-led instruction    Resistance Training Performed Yes    VAD Patient? No    PAD/SET Patient? No      Pain Assessment   Currently in Pain? No/denies    Pain Score 0-No pain    Multiple Pain Sites No             Capillary Blood Glucose: No results found for this or any previous visit (from the past 24 hour(s)).   Exercise Prescription Changes - 11/21/22 1300       Response to Exercise   Blood Pressure (Admit) 140/72    Blood Pressure (Exercise) 154/72    Blood Pressure (Exit) 132/70    Heart Rate (Admit) 61 bpm    Heart Rate (Exercise) 82 bpm    Heart Rate (Exit) 59 bpm    Rating of Perceived Exertion (Exercise) 9    Symptoms None    Comments Pt's first day in the CRP2 program    Duration Continue with 30 min of aerobic exercise without signs/symptoms of physical distress.    Intensity THRR unchanged      Progression   Progression Continue to progress workloads to maintain intensity without signs/symptoms of physical  distress.    Average METs 1.75      Resistance Training   Training Prescription Yes    Weight 2 lbs    Reps 10-15    Time 10 Minutes      Interval Training   Interval Training No      NuStep   Level 1    SPM 45    Minutes 15    METs 1.2      Track   Laps 10    Minutes 15    METs 2.28             Social History   Tobacco Use  Smoking Status Never  Smokeless Tobacco Never    Goals Met:  Exercise tolerated well No report of concerns or symptoms today Strength training completed today  Goals Unmet:  Not Applicable  Comments: Pt started cardiac rehab today.  Pt tolerated light exercise without difficulty. VSS, telemetry-Sinus Rhythm with t wave inversion,bundle branch, block. asymptomatic.  Medication list reconciled. Pt denies barriers to medicaiton compliance.  PSYCHOSOCIAL ASSESSMENT:  PHQ-3. Pt exhibits positive coping skills, hopeful outlook with supportive family. No psychosocial needs identified at this time, no psychosocial interventions necessary.    Pt enjoys researching, playing the piano and spending time with her family.Marland Kitchen  Pt oriented to exercise equipment and routine.    Understanding verbalized. Thayer Headings RN BSN    Dr. Armanda Magic is Medical Director for Cardiac Rehab at [facility].

## 2022-11-24 ENCOUNTER — Ambulatory Visit: Payer: Medicare HMO | Admitting: Podiatry

## 2022-11-24 DIAGNOSIS — Q828 Other specified congenital malformations of skin: Secondary | ICD-10-CM

## 2022-11-24 DIAGNOSIS — M79676 Pain in unspecified toe(s): Secondary | ICD-10-CM | POA: Diagnosis not present

## 2022-11-24 DIAGNOSIS — B351 Tinea unguium: Secondary | ICD-10-CM

## 2022-11-24 DIAGNOSIS — Z7901 Long term (current) use of anticoagulants: Secondary | ICD-10-CM

## 2022-11-24 NOTE — Progress Notes (Deleted)
Cardiology Office Note:    Date:  11/24/2022   ID:  [name], [name] 1940/04/14, MRN [id number]  PCP:  Thana Ates, MD  Cardiologist:  Peter Swaziland, MD  Electrophysiologist:  None   Referring MD: Thana Ates, MD   Chief Complaint: follow-up of hypertension  History of Present Illness:    Beth Simon is a 82 y.o. 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 Swaziland and presents today for follow-up of hypertension.   Patient was first seen by Cardiology during recent admission. She was admitted from 10/17/2022 to 10/19/2022 for NSTEMI after presenting with palpitations, uncontrolled BP, and mild chest discomfort. High-sensitivity troponin  53 >> 2,054. Echo showed LVEF of 30-35% with akinesis of the distal 2/3 of the LV with apical ballooning (suggestive of possible stress-induced cardiomyopathy vs LAD ischemia) as well as grade 2 diastolic dysfunction and mild MR. R/LHC showed showed 45% stenosis of proximal to mid LAD at major 1st Diag followed by 3 tandem 70%, 70%, and 85% stenoses of mid LAD with diffuse moderate diease throughout as well as 60% stenosis of the 1st Diag and 90% stenosis of LPDA. Right and left heart pressures were completely normal but cardiac output and index were mildly reduced at 3.86 and 2.51 respectively. Anatomy was not felt to be favorable for PCI; therefore, medical therapy recommended. Differential felt to be NSTEMI due to demand ischemia vs small aborted type 1 myocardial infarction vs Takotsubo syndrome. However, she was treated with IV Heparin for 72 hours and started on DAPT. She had some hypotension during admission which limited GDMT. She was  discharged on Losartan and Coreg with plans to transition to [facility] and possible add Spironolactone as an outpatient if possible.    Since discharge, she has been seen in the ED twice. She was seen in the ED on 10/31/2022 for hypertension which she stated continued to escalate every time she checked it at home. She denied any chest pain or dyspnea. EKG did show new diffuse T wave inversions. However, high-sensitivity troponin minimally elevated and flat at 78 >> 75. This was felt to possibly be due to recent cardiac event and elevated BP. Coreg was increased and she was discharge. She was seen back in the ED again on 11/02/2022 for hypertension and chest discomfort. She stated at that time that she just did not feel quite right. She checked her BP multiple times and systolic BP got as high as the 180s. She reported an abnormal feeling in her chest but no real pain. BP  was initially 196/77 and then improved to 156/67. EKG showed persistent deep T wave inversions but no acute changes from prior. High-sensitivity troponin 92 >> 88. Hypertension was fel to likely be driven by anxiety. She was again felt to be safe for discharge given close follow-up with Cardiology.  She was last seen by me on 11/04/2022 at which time she reported a "fullness"/ fluttering in her chest that had been constant since discharge but no real chest pain. This was not felt to be cardiac in nature. Her BP had been quite labile at home. Losartan was increased to 25mg  once daily and she was started on Farxiga. She was also  prescribed PRN Hydralazine for BP >160/100. Cardiac Rehab was also recommended.   Patient presents today for follow-up. ***  CAD with Recent NSTEMI Patient was admitted for NSTEMI in 09/2022. High-sensitivity troponin peaked in the 2,000s. LHC showed severe diffuse LAD disease and focal left PDA disease. Anatomy not favorable for PCI so medical therapy recommended. Differential felt to be NSTEMI due to demand ischemia vs  small aborted type 1 myocardial infarction vs Takotsubo syndrome. However, she was treated with IV Heparin for 72 hours and started on DAPT.  - *** - Continue DAPT with Aspirin and Plavix. Plan to continue this for 12 months after MI and then Aspirin alone. - Continue beta-blocker and statin.   Chronic Combined CHF Echo during admission in 09/2022 showed LVEF of 30-35% with akinesis of the distal 2/3 of the LV with apical ballooning (suggestive of possible stress-induced cardiomyopathy vs LAD ischemia) as well as grade 2 diastolic dysfunction and mild MR. LHC showed severe diffuse LAD disease as above. Right and left heart pressures were completely normal but cardiac output and index were mildly reduced at 3.86 and 2.51 respectively. GDMT was limited by hypotension. However, she has had very labile BP since discharge with 2 ED visits for markedly elevated BP. - Euvolemic on exam.  - Continue Losartan 25mg  daily.  - Continue Coreg 6.25mg  twice daily.  - Continue Farxiga 10mg  daily. - Will plan to repeat Echo in about 3 months after optimization of GDMT. Can order at follow-up visit.  ***   Labile Hypertension History of labile hypertension. Baseline BP usually soft but she will have occasional spikes in her BP with systolic BP as high as the 200s.  - Continue current medications: Losartan 25mg  daily and Coreg 6.25mg  twice daily. Also has PRN Hydralazine to take if BP >160/100 (can take this up to twice a day).    Hyperlipidemia Lipid panel on 10/17/2022 during recent admission: Total Cholesterol 205, Triglycerides 79, HDL 78, LDL 111. LDL goal <55 under new guidelines.  - Lipitor was increased from 10mg  to 40mg  daily during recent admission. Continue.  - Will repeat lipid panel and LFTs. ***   EKGs/Labs/Other Studies Reviewed:    The following studies were reviewed:  Echocardiogram 10/17/2022: Impressions: 1. There is akinesis of the distal 2/3 of the left ventricle with apical  ballooning  suggestive of possible Tako-Tsubo (stress-induced)  cardiomyopathy. Left ventricular ejection fraction, by estimation, is 30  to 35%. The left ventricle has moderately  decreased function. The left ventricle demonstrates regional wall motion  abnormalities (see scoring diagram/findings for description). Left  ventricular diastolic parameters are consistent with Grade II diastolic  dysfunction (pseudonormalization).   2. Right ventricular systolic function is normal. The right ventricular  size is normal. There is normal pulmonary artery systolic pressure.   3. Left atrial size was mild to moderately dilated.   4. The mitral valve is normal in structure. Mild mitral valve  regurgitation. No evidence of mitral stenosis.   5. The aortic valve is tricuspid. There is mild calcification of the  aortic valve. Aortic valve regurgitation is trivial. Aortic valve  sclerosis/calcification is present, without any evidence of aortic  stenosis.   6. The inferior vena cava is normal in size with greater than 50%  respiratory variability, suggesting right atrial pressure of 3 mmHg.  _______________   Right/ Left Cardiac Catheterization 10/17/2022:   Prox LAD to Mid LAD lesion is 45% stenosed at major 1st Diag followed by Mid LAD-1 lesion is 70% stenosed-  just after 1st Sept (with ~ berry aneurysmal segment.  Not favorable for PCI.   Mid LAD-2 lesion is 70% stenosed.  Mid LAD-3 lesion is 85% stenosed. With difuse moderate disease throughout.   1st Diag lesion is 60% stenosed.   LPDA lesion is 90% stenosed.   LV end diastolic pressure is normal.   There is no aortic valve stenosis.   POST-CATH DIAGNOSES Severe diffuse LAD disease and focal left PDA disease of the left dominant system and separate ostia of LAD and dominant LCx. LAD has multiple sequential lesions ranging from 40% to 85-90% with 1 area involving branch points of major diagonal branch and septal perforator not very favorable for PCI  especially given extent of calcification. Focal PDA 90% stenosis just prior to a very tortuous area.  (PCI amenable, but question of usefulness with the extent of disease in LAD). Well compensated CHF with completely normal Right Heart Cath and LV Pressures, but notably reduced Cardiac Output and Index (3.86, 2.51) PAP-mean 21/11-16 million mercury, PCWP 6 mm, LV P-EDP 128/2-11 mmHg with a AO P-MAP 124/60-86 mmHg. Ao sat 94%, PA sat 64%.   RECOMMENDATIONS In the absence of any other complications or medical issues, we expect the patient to be ready for discharge from a cath perspective. Would optimize medical therapy for CAD and cardiomyopathy.  Appears to be well compensated. If she has worsening dyspnea on exertion or anginal equivalent type symptoms, could consider reevaluating PCI options, but for now would plan to treat medically.  Stabilized medically prior to discharge. Reinitiate IV heparin 2 hours after TR band removal and run for total of 48 hours. Based on likely ACS presentation and existing disease, would recommend at least 3 to 6 months of DAPT followed by completion of 1 year Plavix monotherapy. Will DC lisinopril with plans to potentially convert to ARB plus or minus Entresto.   Diagnostic Dominance: Right     EKG:  EKG not ordered today.   Recent Labs: 10/17/2022: B Natriuretic Peptide 33.3; TSH 2.258 10/31/2022: Magnesium 2.2 11/02/2022: ALT 18; Hemoglobin 12.8; Platelets 280 11/14/2022: BUN 11; Creatinine, Ser 0.73; Potassium 4.6; Sodium 134  Recent Lipid Panel    Component Value Date/Time   CHOL 205 (H) 10/17/2022 1216   TRIG 79 10/17/2022 1216   HDL 78 10/17/2022 1216   CHOLHDL 2.6 10/17/2022 1216   VLDL 16 10/17/2022 1216   LDLCALC 111 (H) 10/17/2022 1216    Physical Exam:    Vital Signs: LMP 03/31/1988 (Approximate)     Wt Readings from Last 3 Encounters:  11/13/22 127 lb 3.3 oz (57.7 kg)  11/04/22 127 lb (57.6 kg)  11/02/22 127 lb (57.6 kg)     General:  82 y.o. female in no acute distress. HEENT: Normocephalic and atraumatic. Sclera clear. EOMs intact. Neck: Supple. No carotid bruits. No JVD. Heart: *** RRR. Distinct S1 and S2. No murmurs, gallops, or rubs. Radial and distal pedal pulses 2+ and equal bilaterally. Lungs: No increased work of breathing. Clear to ausculation bilaterally. No wheezes, rhonchi, or rales.  Abdomen: Soft, non-distended, and non-tender to palpation. Bowel sounds present in all 4 quadrants.  MSK: Normal strength and tone for age. *** Extremities: No lower extremity edema.    Skin: Warm and dry. Neuro: Alert and oriented x3. No focal deficits. Psych: Normal affect. Responds appropriately.   Assessment:    No diagnosis found.  Plan:     Disposition: Follow up in ***   Medication Adjustments/Labs and Tests Ordered: Current medicines  are reviewed at length with the patient today.  Concerns regarding medicines are outlined above.  No orders of the defined types were placed in this encounter.  No orders of the defined types were placed in this encounter.   There are no Patient Instructions on file for this visit.   Leanne Lovely, PA-C  11/24/2022 6:04 PM    [facility]

## 2022-11-25 ENCOUNTER — Other Ambulatory Visit: Payer: Self-pay | Admitting: Student

## 2022-11-25 ENCOUNTER — Ambulatory Visit: Payer: Medicare HMO

## 2022-11-25 DIAGNOSIS — Z8542 Personal history of malignant neoplasm of other parts of uterus: Secondary | ICD-10-CM | POA: Diagnosis not present

## 2022-11-25 DIAGNOSIS — I890 Lymphedema, not elsewhere classified: Secondary | ICD-10-CM

## 2022-11-25 NOTE — Therapy (Signed)
OUTPATIENT PHYSICAL THERAPY  LOWER EXTREMITY ONCOLOGY TREATMENT  Patient Name: Beth Simon MRN: [id number] DOB:01-16-1941, 82 y.o., female Today's Date: 11/25/2022  END OF SESSION:  PT End of Session - 11/25/22 1105     Visit Number 9    Number of Visits 12    Date for PT Re-Evaluation 12/22/22    PT Start Time 1105    PT Stop Time 1158    PT Time Calculation (min) 53 min    Activity Tolerance Patient tolerated treatment well    Behavior During Therapy [facility] for tasks assessed/performed             Past Medical History:  Diagnosis Date   Adenomatous polyps 01/2016   Anemia    Breast cancer (HCC) 1990   bilateral mastectomy, adenoca breast-left MRM, reconstruction, chemo   Bronchitis last 2 weeks   saw dr Pete Glatter 02-28-2014, he said no antibiotics needed, nonproductive cough   Complication of anesthesia    Cystitis    cytoxen, had once or twice   Family history of breast cancer    History of breast cancer    History of radiation therapy 2/10, 2/12, 2/18, 2/24, 05/29/14   vaginal cuff/ 30 Gy/ 5 fx   Hypertension    Macular degeneration    Neck pain    taking physictal therapy last 2 weeks   Osteoporosis    On Prolia.   PONV (postoperative nausea and vomiting)    Shoulder pain    taking physical therapy for last few weeks   Uterine cancer (HCC) 03/21/2014   MLH1/PMS2 LOH   Uterine fibroid    VAIN I (vaginal intraepithelial neoplasia grade I) 2018   positive HR HPV.   Vasovagal syncopes    Past Surgical History:  Procedure Laterality Date   BREAST IMPLANT REMOVAL  6/94   left breast   BREAST RECONSTRUCTION  1990   left   CATARACT EXTRACTION Bilateral    EXCISION VAGINAL CYST     MASTECTOMY Right 10/08/06   prophylactic   RADICAL MASTECTOMY LND  1990   left, chemo done   RIGHT/LEFT HEART CATH AND CORONARY ANGIOGRAPHY N/A 10/17/2022   Procedure: RIGHT/LEFT HEART CATH AND CORONARY ANGIOGRAPHY;  Surgeon: Marykay Lex, MD;  Location: [facility] INVASIVE CV LAB;   Service: Cardiovascular;  Laterality: N/A;   ROBOTIC ASSISTED TOTAL HYSTERECTOMY WITH BILATERAL SALPINGO OOPHERECTOMY Bilateral 03/21/2014   Procedure: ROBOTIC ASSISTED TOTAL HYSTERECTOMY WITH BILATERAL SALPINGO OOPHORECTOMY ;  Surgeon: Laurette Schimke, MD;  Location: [facility];  Service: Gynecology;  Laterality: Bilateral;   Patient Active Problem List   Diagnosis Date Noted   Chronic combined systolic and diastolic CHF (congestive heart failure) (HCC) 11/04/2022   History of non-ST elevation myocardial infarction (NSTEMI) 10/17/2022   Palpitations 10/17/2022   Hyperlipidemia 10/17/2022   Hyponatremia 10/17/2022   Fatigue 10/17/2022   History of breast cancer 10/17/2022   Age-related osteoporosis without current pathological fracture 03/19/2020   Personal history of colonic polyps 03/19/2020   Transient cerebral ischemia 09/15/2017   Pure hypercholesterolemia 09/15/2017   Pulmonary nodule 09/15/2017   Essential hypertension 09/15/2017   Gastro-esophageal reflux disease without esophagitis 09/15/2017   VAIN I (vaginal intraepithelial neoplasia grade I) 06/23/2017   History of uterine cancer    Family history of breast cancer    Endometrial cancer (HCC) 03/21/2014   Uterine cancer (HCC) 03/21/2014   S/P mastectomy, bilateral 05/02/2013   Situational stress 05/02/2013   Osteoporosis, unspecified 05/02/2013   Breast cancer, left breast (HCC)  05/02/2013    PCP: Merlene Laughter, MD  REFERRING PROVIDER: Wyvonnia Lora MD  REFERRING DIAG: LE Lymphedema  THERAPY DIAG:  Lymphedema, not elsewhere classified  History of uterine cancer  ONSET DATE: around 05/2021  Rationale for Evaluation and Treatment: Rehabilitation  SUBJECTIVE:                                                                                                                                                                                           SUBJECTIVE STATEMENT:  11/25/2022  I had a long walk in the park this am  for 2 miles. I didn't wear my compression socks because I have an ingrown toenail. I started Cardiac Rehab on Friday. They are very protective of you. I have it every Wed and Fri. I haven't done the MLD. The day goes so quick and I am so tired. I will do better before next visit.  11/10/2022 Pt has a hx of CAD and had a NSTEMI on 10/08/22.  HER EF is 30-35%.She will start cardiac rehab on 11/13/2022. I have lost 7 lbs because I have no taste for food. I haven't done any MLD since I was here last. My BP has been high and low. Trying to get Meds stable to control it.  09/22/2022 EVAL Pt returns due to swelling in her legs, left greater than right. It comes and goes.. This morning it  seems to be down. The socks we ordered online were really good, but hot,but I ordered some online at [facility] that were thinner. When I was in [place] we did a great deal of walking. I have done my MLD occasionally and do both legs but I feel I need a refresher.. She had an injection in her right knee last Tuesday with Kenalog and she had some strange side effects; it made me feel weak in my arms and legs and caused some tingling in my arms and legs. The sensation is still there but not as severe. I have been inactive today so my legs look pretty good. Since the injection last week and feeling funny I have not done much. (Pt advised to follow up with MD about symptoms she is still having) She will be starting PT at [facility] for Right knee pain.  PERTINENT HISTORY:  History notable for left breast cancer ER-[facility]-  treated with radical mastectomy and chemotherapy in 1990.  Subsequently underwent right simple mastectomy 2008 without reconstruction. Denies use of tamoxifen  On 03/21/14  she underwent a robotic hysterectomy for treatment of uterine cancer, BSO. Frozen section revealed grade 1 cancer. Final pathology revealed a grade 2 lesion with LVSI  present and she was staged at stage IA. She was recommended vaginal brachytherapy as  adjuvant therapy to reduce risk for local recurrence. Dr Karoline Caldwell delivered vaginal brachytherapy completed on 05/29/14. Her treatments were without complication. Pt had therapy from 11/12/21 to 01/08/22 for bilateral LE swelling. PAIN:  Are you having pain? No  PRECAUTIONS: Bilateral LE lymphedema,s/p Left breast cancer, Recent MI (10/17/2022), now starting cardiac rehab next week  WEIGHT BEARING RESTRICTIONS: No  FALLS:  Has patient fallen in last 6 months? No  LIVING ENVIRONMENT: Lives with: lives alone Lives in: House/apartment Stairs: No;  Has following equipment at home: None  OCCUPATION: Retired  LEISURE: walks, Pilates with Roanna Raider  PRIOR LEVEL OF FUNCTION: Independent  PATIENT GOALS: Review self MLD,check present compression garments and purchase new prn,    OBJECTIVE:  COGNITION: Overall cognitive status: Within functional limits for tasks assessed   PALPATION: Tenderness left lower leg with light palpation, not so on right. Very mild pitting bilaterally mostly foot/ankle  OBSERVATIONS / OTHER ASSESSMENTS: multiple varicosities noted bilateral LE's  SENSATION: Light touch: Appears intact  POSTURE: Forward head rounded shoulders  LYMPHEDEMA ASSESSMENTS:   SURGERY TYPE/DATE: uterine cancer with total hysterectomy in 2016, 1990 L mastectomy and ALND, 2008 R mastectomy   NUMBER OF LYMPH NODES REMOVED: 0  CHEMOTHERAPY: completed for L breast cancer   RADIATION:completed  HORMONE TREATMENT: NONE  INFECTIONS: NONE  LYMPHEDEMA ASSESSMENTS:   LOWER EXTREMITY LANDMARK RIGHT eval RIGHT 10/09/2022 RIGHT 10/15/2022 RIGHT 11/10/2022  At groin      30 cm proximal to suprapatella      20 cm proximal to suprapatella      10 cm proximal to suprapatella      At midpatella / popliteal crease      30 cm proximal to floor at lateral plantar foot 36 36 36.2 35.7  20 cm proximal to floor at lateral plantar foot 28.2 28.3 28..5 28.3  10 cm proximal to floor at  lateral plantar foot 22.0 22.1 22.2 22.1  Circumference of ankle/heel      5 cm proximal to 1st MTP joint 20.3     Across MTP joint 20.5 20.3 20.3 20.2  Around proximal great toe 7 7 7.0 7.1  (Blank rows = not tested)  LOWER EXTREMITY LANDMARK LEFT eval LEFT 10/09/2022 LEFT 10/15/2022 LEFT 11/10/2022  At groin      30 cm proximal to suprapatella      20 cm proximal to suprapatella      10 cm proximal to suprapatella      At midpatella / popliteal crease      30 cm proximal to floor at lateral plantar foot 36.6 36.7 36.6 36.3  20 cm proximal to floor at lateral plantar foot 29.1 29.3 28.5 29.0  10 cm proximal to floor at lateral plantar foot 23.2 22.5 23.4 23.2  Circumference of ankle/heel      5 cm proximal to 1st MTP joint 20.7     Across MTP joint 20.5 20.3 20.4 20.3  Around proximal great toe 7.3 7.1 7.1 7.3  (Blank rows = not tested)  GAIT: Distance walked: 40 ft Assistive device utilized: None Level of assistance: Complete Independence Comments: normal gait, no difficulties  Outcome measure:  TODAY'S TREATMENT:  DATE:  11/25/2022 Left leg slightly more swollen than right and pt not wearing compression due to ingrown toenail MLD: In Supine with HOB elevated and legs elevated on large bolster and smaller round bolster: Short neck, 5 diaphragmatic breaths, Lt inguinal nodes, (no anastomosis due to Lt ALND in 1990); and then Lt LE working from proximal to distal, then retracing all steps (with intact sequence) and ending with LN's . PT performed  all steps and pt verbalized sequence to PT while PT was performing. MLD to the right LE using the right axillary Lns. Therapist then activated right LN's a axillary LN's,right  inguinal-axillary pathway and Right LE lateral thigh, then medial to lateral retracing steps, knee and lower leg retracing  steps then right foot retracing steps and ending with LN"S. Therapist performed all on this side as well with verbalization to pt. Pt demonstrated strokes at LN's and  inguino-axillary pathway, and recited each sequence bilaterally.    11/10/2022  Pt remeasured bilaterally MLD: In Supine with HOB elevated and legs elevated on large bolster and smaller round bolster: Short neck, 5 diaphragmatic breaths, Lt inguinal nodes, (no anastomosis due to Lt ALND in 1990); and then Lt LE working from proximal to distal, then retracing all steps (with intact sequence) and ending with LN's . PT performed  all steps and pt verbalized sequence to PT while PT was performing. MLD to the right LE using the right axillary Lns. Therapist then activated right LN's a axillary LN's,right  inguinal-axillary pathway and Right LE lateral thigh, then medial to lateral retracing steps, knee and lower leg retracing steps then right foot retracing steps and ending with LN"S. Therapist performed all on this side as well with verbalization to pt.  10/15/2022  MLD: In Supine with HOB elevated and legs elevated on large bolster and smaller round bolster: Short neck, 5 diaphragmatic breaths, Lt inguinal nodes, (no anastomosis due to Lt ALND in 1990); and then Lt LE working from proximal to distal, then retracing all steps (with intact sequence) and ending with LN's . Pt erformed  all steps and then therapist performed all  steps on left and right LE as below. She did very well with only a few cues required. MLD to the right LE using the right axillary Lns. Therapist then activated right LN's a axillary LN's,right  inguinal-axillary pathway and Right LE lateral thigh, then medial to lateral retracing steps, knee and lower leg retracing steps then right foot retracing steps and ending with LN"S. Therapist performed all on this side with verbalization to pt.  10/13/2022   MLD: In Supine with HOB elevated and legs elevated on large bolster  and smaller round bolster: Short neck, 5 diaphragmatic breaths, Lt inguinal nodes, (no anastomosis due to Lt ALND in 1990); and then Lt LE working from proximal to distal, then retracing all steps (with intact sequence) and ending with LN's . Therpist performed steps and then had pt repeat steps verbally and with use of hands.. She did very well with occasional VC and TC's MLD to the right LE using the right axillary Lns. Therapist then activated right LN's a axillary LN's,right  inguinal-axillary pathway and Right LE lateral thigh, then medial to lateral retracing steps, knee and lower leg retracing steps then right foot retracing steps and ending with LN"S. Therapist performed all on this side with verbalization to pt. Pt given handout demonstrating proper sequences  10/09/2022 Measured pts legs bilaterally and they remain stable with her 8-15 mmHG compression stockings MLD:  In Supine with HOB elevated and legs elevated on large bolster and smaller round bolster: Short neck, 5 diaphragmatic breaths, Lt inguinal nodes, (no anastomosis due to Lt ALND in 1990); and then Lt LE working from proximal to distal, then retracing all steps (with intact sequence) and ending with LN's . Therpist performed steps and then had pt repeat steps verbally and with use of hands.. She did very well with occasional VC and TC's MLD to the right LE using the right axillary Lns. Therapist then activated right LN's a axillary LN's,right  inguinal-axillary pathway and Right LE lateral thigh, then medial to lateral retracing steps, knee and lower leg retracing steps then right foot retracing steps and ending with LN"S. Therapist performed all on this side with verbalization to pt.  10/07/2022  MLD: In Supine with HOB elevated and legs elevated on large bolster and smaller round bolster: Short neck, 5 diaphragmatic breaths, Lt inguinal nodes, (no anastomosis due to Lt ALND in 1990); and then Lt LE working from proximal to distal, then  retracing all steps (with intact sequence) and ending with LN's . Therpist performed steps and then had pt repeat all. She did very well with occasional VC and TC's MLD to the right LE using the right axillary Lns. Therapist then activated right LN's a axillary LN's,right  inguinal-axillary pathway and Right LE lateral thigh, then medial to lateral retracing steps, knee and lower leg retracing steps then right foot retracing steps and ending with LN"S. Therapist performed all on this side with verbalization to pt.    09/30/2022  Pt brought her Juzo 15-20 compression socks with her but they are too warm for this time of year per pt. Removed her 8-15 MM Hg stockings and her legs look good MLD: In Supine with HOB elevated and legs elevated on large bolster and smaller round bolster: Short neck, 5 diaphragmatic breaths, Lt inguinal nodes, (no anastomosis due to Lt ALND in 1990); and then Lt LE working from proximal to distal, then retracing all steps (with intact sequence) and ending with LN's  MLD to the right LE using the right axillary Lns. Therapist then activated right LN's and had pt demonstrate each of the steps including axillary LN's,right  inguinal-axillary pathway and Right LE lateral thigh, then medial to lateral retracing steps, knee and lower leg retracing steps then right foot retracing steps and ending with LN"S.   09/24/22  Pt removed compression stockings; wearing [employer] knee high compression 8-15 mmhg, and legs look OK today. Pt was able to don and doff independently MLD: In Supine with HOB elevated and legs elevated on large bolster and smaller round bolster: Short neck, 5 diaphragmatic breaths, Lt inguinal nodes, (no anastomosis due to Lt ALND in 1990); and then Lt LE working from proximal to distal, then retracing all steps (with intact sequence) and ending with LN's  MLD to the right LE using the right axillary Lns. Therapist then activated right LN's and had pt demonstrate each of the  steps including axillary LN's,right  inguinal-axillary pathway and Right LE lateral thigh, then medial to lateral retracing steps, knee and lower leg retracing steps then right foot retracing steps and ending with LN"S.Pt practiced each step on the right and needs moderate VC's and visual cues.  6/24/24MLD: In Supine with HOB elevated and legs elevated on large bolster with pillows under knees: Short neck, 5 diaphragmatic breaths, Lt inguinal nodes, (no anastomosis due to Lt ALND in 1990); and then Lt LE working from proximal  to distal, then retracing all steps (with intact sequence) but will continue intact for remaining sessions for Lt LE) /24/2024 Initiated MLD to left lower extremity; short neck, 5 breaths, left inguinal LN's, Lateral knee to thigh, then inner thigh to outer, repeating lateral thigh, then ankle to knee repeating both sides of leg , retracing steps  lateral thigh to hip, then left foot retracing all steps and ending with LN's at left groin.  PATIENT EDUCATION:  Education details: POC, bring garments next discussed legs will swell if not wearing compression Person educated: Patient Education method: Explanation and Demonstration Education comprehension: verbalized understanding, requires further education  HOME EXERCISE PROGRAM: None given today  ASSESSMENT:  CLINICAL IMPRESSION: Pt still getting very fatigued since her MI and has a lot of appts to contend with now. She was able to verbalize complete sequence for MLD on both sides, and demonstrated several techniques very well. She was encouraged to perform MLD on 1 leg if very fatigued, likely the left as it tends to be more swollen and to do so prior to next appt. Anticipate DC next.   OBJECTIVE IMPAIRMENTS: decreased knowledge of condition, decreased knowledge of use of DME, and increased edema.   ACTIVITY LIMITATIONS:  no limitations but legs swell more with being on feet for prolong periods or sitting prolonged  periods  PARTICIPATION LIMITATIONS:  none  PERSONAL FACTORS:  none  are also affecting patient's functional outcome.   REHAB POTENTIAL: Excellent  CLINICAL DECISION MAKING: Stable/uncomplicated  EVALUATION COMPLEXITY: Low   GOALS: Goals reviewed with patient? Yes  SHORT TERM GOALS=LONG TERM GOALS: Target date: 10/20/2022     Pt will be independent in self MLD for long term management of lymphedema.  Baseline:  Goal status: ONGOING 2. Pt will obtain have/obtain appropriate compression garments for long term management of lymphedema.  Baseline: Goal status: UJW:[id number] garments sem to be holding well  3.  Pt will be more compliant with compression garments to maintain edema reduction Baseline:  Goal status: Nearly Met but did not have on today    PLAN:  PT FREQUENCY: 3 visits  PT DURATION: 6 weeks   PLANNED INTERVENTIONS:  Therapeutic exercises, Patient/Family education, Self Care, Orthotic/Fit training, DME instructions, Manual lymph drainage, Compression bandaging, Vasopneumatic device, and Manual therapy   PLAN FOR NEXT SESSION: check BP prn,begin MLD to bilateral LE and instruct/review self MLD technique, look at old compression garments and determine if she needs new ones. Encourage compliance with compression.   Waynette Buttery, PT 11/25/2022, 12:06 PM

## 2022-11-25 NOTE — Progress Notes (Signed)
Subjective: Chief Complaint  Patient presents with   Nail Problem    Nail trim     82 y.o. returns the office today for painful, elongated, thickened toenails which she is unable to trim herself.  States the nails get painful to get elongated.  No open lesions.   She is now on Plavix.   Objective: AAO 3, NAD DP/PT pulses palpable, CRT less than 3 seconds Nails hypertrophic, dystrophic, elongated, brittle, discolored 10.  Incurvation of the nails most on the right second toenail with localized edema.  There is no drainage or pus today.  There is tenderness overlying the nails 1-5 bilaterally. There is no surrounding erythema or drainage along the nail sites.  Ingrown toenails present with any signs of infection.  Minimal hyperkeratotic lesions of the distal aspect left second digit. No underlying ulceration, drainage or other signs of infection.  No pain with calf compression, warmth, erythema.  Assessment: Symptomatic onychomycosis, ingrown toenails; hyperkeratotic lesion left foot  Plan: -Treatment options including alternatives, risks, complications were discussed -Nails sharply debrided 10 without any complications or bleeding.  After ingrown toenails plain signs of infection or continued discomfort. -Hyperkeratotic lesion sharply debrided 1 without complication/bleeding -Daily foot inspection.  Ovid Curd, DPM

## 2022-11-26 ENCOUNTER — Telehealth: Payer: Self-pay | Admitting: Cardiology

## 2022-11-26 ENCOUNTER — Encounter (HOSPITAL_COMMUNITY)
Admission: RE | Admit: 2022-11-26 | Discharge: 2022-11-26 | Disposition: A | Discharge status: Home / Self Care | Payer: Medicare HMO | Source: Ambulatory Visit | Attending: Cardiology | Admitting: Cardiology

## 2022-11-26 DIAGNOSIS — I252 Old myocardial infarction: Secondary | ICD-10-CM | POA: Diagnosis not present

## 2022-11-26 DIAGNOSIS — Z5189 Encounter for other specified aftercare: Secondary | ICD-10-CM | POA: Diagnosis not present

## 2022-11-26 DIAGNOSIS — I214 Non-ST elevation (NSTEMI) myocardial infarction: Secondary | ICD-10-CM

## 2022-11-26 MED ORDER — LOSARTAN POTASSIUM 25 MG PO TABS: 25.0000 mg | ORAL_TABLET | Freq: Every day | ORAL | 3 refills | Status: DC

## 2022-11-26 NOTE — Telephone Encounter (Signed)
Pt c/o medication issue:  1. Name of Medication:   losartan (COZAAR) 25 MG tablet   2. How are you currently taking this medication (dosage and times per day)? As prescribed  3. Are you having a reaction (difficulty breathing--STAT)?   No  4. What is your medication issue?   Patient stated she will need a new prescription for this medication sent to [id number] - [place], [place] - [address] as the dosage was increased from 1/2 tablet to 1 tablet per day.  Patient stated she only has 1 tablet left.

## 2022-11-26 NOTE — Telephone Encounter (Signed)
Will send refill to pt's pharmacy.

## 2022-11-27 ENCOUNTER — Other Ambulatory Visit: Payer: Self-pay | Admitting: Student

## 2022-11-27 ENCOUNTER — Ambulatory Visit: Payer: Medicare HMO

## 2022-11-28 ENCOUNTER — Encounter (HOSPITAL_COMMUNITY)
Admission: RE | Admit: 2022-11-28 | Discharge: 2022-11-28 | Disposition: A | Discharge status: Home / Self Care | Payer: Medicare HMO | Source: Ambulatory Visit | Attending: Cardiology | Admitting: Cardiology

## 2022-11-28 ENCOUNTER — Other Ambulatory Visit: Payer: Self-pay | Admitting: Student

## 2022-11-28 DIAGNOSIS — I214 Non-ST elevation (NSTEMI) myocardial infarction: Secondary | ICD-10-CM

## 2022-11-28 DIAGNOSIS — I252 Old myocardial infarction: Secondary | ICD-10-CM | POA: Diagnosis not present

## 2022-11-28 DIAGNOSIS — Z5189 Encounter for other specified aftercare: Secondary | ICD-10-CM | POA: Diagnosis not present

## 2022-12-01 ENCOUNTER — Other Ambulatory Visit: Payer: Self-pay | Admitting: Student

## 2022-12-03 ENCOUNTER — Encounter (HOSPITAL_COMMUNITY)
Admission: RE | Admit: 2022-12-03 | Discharge: 2022-12-03 | Disposition: A | Discharge status: Home / Self Care | Payer: Medicare HMO | Source: Ambulatory Visit | Attending: Cardiology | Admitting: Cardiology

## 2022-12-03 DIAGNOSIS — I214 Non-ST elevation (NSTEMI) myocardial infarction: Secondary | ICD-10-CM | POA: Insufficient documentation

## 2022-12-05 ENCOUNTER — Encounter (HOSPITAL_COMMUNITY)
Admission: RE | Admit: 2022-12-05 | Discharge: 2022-12-05 | Disposition: A | Discharge status: Home / Self Care | Payer: Medicare HMO | Source: Ambulatory Visit | Attending: Cardiology | Admitting: Cardiology

## 2022-12-05 DIAGNOSIS — I214 Non-ST elevation (NSTEMI) myocardial infarction: Secondary | ICD-10-CM

## 2022-12-08 ENCOUNTER — Ambulatory Visit: Payer: Medicare HMO | Admitting: Student

## 2022-12-08 DIAGNOSIS — R69 Illness, unspecified: Secondary | ICD-10-CM | POA: Diagnosis not present

## 2022-12-10 ENCOUNTER — Encounter (HOSPITAL_COMMUNITY)
Admission: RE | Admit: 2022-12-10 | Discharge: 2022-12-10 | Disposition: A | Discharge status: Home / Self Care | Payer: Medicare HMO | Source: Ambulatory Visit | Attending: Cardiology | Admitting: Cardiology

## 2022-12-10 DIAGNOSIS — I214 Non-ST elevation (NSTEMI) myocardial infarction: Secondary | ICD-10-CM | POA: Diagnosis not present

## 2022-12-12 ENCOUNTER — Other Ambulatory Visit: Payer: Self-pay

## 2022-12-12 ENCOUNTER — Encounter (HOSPITAL_COMMUNITY): Payer: Medicare HMO

## 2022-12-12 DIAGNOSIS — Z0000 Encounter for general adult medical examination without abnormal findings: Secondary | ICD-10-CM | POA: Diagnosis not present

## 2022-12-12 DIAGNOSIS — I5042 Chronic combined systolic (congestive) and diastolic (congestive) heart failure: Secondary | ICD-10-CM | POA: Diagnosis not present

## 2022-12-12 DIAGNOSIS — K219 Gastro-esophageal reflux disease without esophagitis: Secondary | ICD-10-CM | POA: Diagnosis not present

## 2022-12-12 DIAGNOSIS — M810 Age-related osteoporosis without current pathological fracture: Secondary | ICD-10-CM | POA: Diagnosis not present

## 2022-12-12 DIAGNOSIS — I2510 Atherosclerotic heart disease of native coronary artery without angina pectoris: Secondary | ICD-10-CM | POA: Diagnosis not present

## 2022-12-12 DIAGNOSIS — Z1331 Encounter for screening for depression: Secondary | ICD-10-CM | POA: Diagnosis not present

## 2022-12-12 DIAGNOSIS — Z23 Encounter for immunization: Secondary | ICD-10-CM | POA: Diagnosis not present

## 2022-12-12 DIAGNOSIS — Z8542 Personal history of malignant neoplasm of other parts of uterus: Secondary | ICD-10-CM | POA: Diagnosis not present

## 2022-12-12 DIAGNOSIS — Z853 Personal history of malignant neoplasm of breast: Secondary | ICD-10-CM | POA: Diagnosis not present

## 2022-12-12 DIAGNOSIS — I110 Hypertensive heart disease with heart failure: Secondary | ICD-10-CM | POA: Diagnosis not present

## 2022-12-12 DIAGNOSIS — E7800 Pure hypercholesterolemia, unspecified: Secondary | ICD-10-CM | POA: Diagnosis not present

## 2022-12-12 MED ORDER — DAPAGLIFLOZIN PROPANEDIOL 10 MG PO TABS: 10.0000 mg | ORAL_TABLET | Freq: Every day | ORAL | 3 refills | Status: DC

## 2022-12-14 NOTE — Progress Notes (Unsigned)
Cardiology Clinic Note   Date: 12/15/2022 ID: Beth Simon, Beth Simon December 16, 1940, MRN [id number]  Primary Cardiologist:  Peter Swaziland, MD  Patient Profile    Beth Simon is a 82 y.o. female who presents to the clinic today for follow up for BP.     Past medical history significant for: CAD. R/LHC 10/17/2022 (NSTEMI): Proximal to mid LAD 45%.  Mid LAD #1 70%, #2 70%, #3 85%.  D1 60%.  LPDA 90%.  LVEDP normal.  No aortic valve stenosis. LAD has multiple sequential lesions ranging from 40% to 85-90% with 1 area involving branch points of major diagonal branch and septal perforator not very favorable for PCI especially given extent of calcification. Focal PDA 90% stenosis just prior to a very tortuous area.  (PCI amenable, but question of usefulness with the extent of disease in LAD).  Recommend optimizing medical therapy. Chronic combined systolic and diastolic heart failure. R/LHC 10/17/2022: PAP-mean 21/11-16 mmHg, PCWP 6 mm, LV P-EDP 128/2-11 mmHg with a AO P-MAP 124/60-86 mmHg. Ao sat 94%, PA sat 64%.  Well compensated CHF with completely normal RHC and LV pressures but notably reduced cardiac output and index (3.86, 2.51). Echo 10/17/2022: EF 30 to 35%.  Akinesis of the distal two thirds of the left ventricle with apical ballooning suggestive of possible Takotsubo cardiomyopathy.  Grade II DD.  Normal RV function.  Normal PA pressure.  Moderate LAE.  Mild MR.  Aortic valve sclerosis/calcification without stenosis. Hypertension. Hyperlipidemia. Lipid panel 10/17/2022: LDL 111, HDL 78, TG 79, total 205. LPa 10/18/2022: 104.2.  TIA. Breast cancer. S/p bilateral mastectomy February 2015. Uterine/endometrial cancer.     History of Present Illness    Beth Simon was first evaluated by cardiology on 10/17/2022 for NSTEMI during hospital admission.  Patient presented to the ED with palpitations, uncontrolled BP, and mild chest discomfort.  Troponin 53>> 2054.  Echo showed EF 30 to 35% with  akinesis of the distal two thirds of the LV with apical ballooning suggestive of Takotsubo cardiomyopathy versus LAD ischemia, Grade II DD, mild MR.  R/LHC showed LAD with multiple sequential lesions involving branch points of major diagonal branch and septal perforator not very favorable for PCI especially given extent of calcification.  Focal PDA very torturous amenable to PCI but question usefulness with the extensive disease in LAD.  Recommendation for optimizing medical therapy for CAD.  Normal RHC and LV pressures but notably reduced cardiac output and index.  Hypotension during admission Limited GDMT.  Patient was discharged on 10/19/2022 with losartan and carvedilol with plans to transition to [facility] and possibly add spironolactone as an outpatient.  Discharge she has been seen in the ED x 2.  On 10/31/2022 she presented for hypertension without chest pain or dyspnea.  EKG showed new diffuse T wave inversions.  Troponin minimally elevated and flat 78>> 75 possibly due to recent cardiac event and elevated BP.  Carvedilol was increased and she was discharged.  She returned to the ED on 11/02/2022 for hypertension and chest discomfort.  SBP as high as 180s.  Initial BP 196/77 with improvement to 156/67.  EKG showed persistent deep T wave inversions but no acute changes from prior.  Troponin 92>> 88.  Hypertension was felt to be driven by anxiety.  She was discharged with close outpatient cardiology follow-up.  Patient was last seen in the office on 11/04/2022 by Marjie Skiff, PA-C and reported "fullness" and fluttering in chest that had been constant since discharge.  This is  not felt to be cardiac in nature.  BP labile at home.  Losartan increased and she was started on Farxiga.  She was also provided with as needed hydralazine for BP > 160/100.  Cardiac rehab was recommended.  Today, patient is here alone. She brings in BP readings consistently 90-120/50-70 (will be scanned into the chart). She denies  lightheadedness, dizziness, presyncope, syncope. Patient denies shortness of breath, dyspnea on exertion, lower extremity edema, orthopnea or PND. No chest pain, pressure, or tightness. No palpitations. She has been participating in cardiac rehab with good tolerance. Her only complaint is fatigue. Discussed decreasing carvedilol but patient would like to hold off until after cardiac rehab is completed to see if it improves. Patient reports having some blurred vision which is not unusual for her. She has an eye appointment coming up.     ROS: All other systems reviewed and are otherwise negative except as noted in History of Present Illness.  Studies Reviewed      EKG is not ordered today.      Physical Exam    VS:  BP (!) 140/80 (BP Location: Left Arm, Patient Position: Sitting, Cuff Size: Normal)   Pulse 60   Ht 4' 10.5" (1.486 m)   Wt 124 lb (56.2 kg)   LMP 03/31/1988 (Approximate)   SpO2 94%   BMI 25.47 kg/m  , BMI Body mass index is 25.47 kg/m.  GEN: Well nourished, well developed, in no acute distress. Neck: No JVD or carotid bruits. Cardiac:  RRR. No murmurs. No rubs or gallops.   Respiratory:  Respirations regular and unlabored. Clear to auscultation without rales, wheezing or rhonchi. GI: Soft, nontender, nondistended. Extremities: Radials/DP/PT 2+ and equal bilaterally. No clubbing or cyanosis. No edema.  Skin: Warm and dry, no rash. Neuro: Strength intact.  Assessment & Plan    CAD.  LHC July 2024 in the setting of NSTEMI showed LAD with multiple sequential lesions not amenable to PCI and focal PDA stenosis amenable to PCI but question of usefulness with the extent of disease in LAD, recommendation for medical therapy.  Patient denies chest pain, pressure or tightness. She is participating in cardiac rehab with good tolerance.  Continue aspirin, Plavix, carvedilol, atorvastatin. Chronic combined systolic and diastolic heart failure.  Echo July 2024 showed EF 30 to 35%,  akinesis of the distal two thirds of the left ventricle with apical ballooning suggestive of Takotsubo cardiomyopathy, Grade II DD.  RHC July 2024 showed well compensated CHF with completely normal RHC and LV pressures but notably reduced cardiac output and index.  Patient denies lower extremity edema, shortness of breath, DOE, orthopnea or PND. Euvolemic and well compensated on exam.  Continue carvedilol, losartan, Farxiga. Hypertension. BP today 140/80. Home BP 90-120/50-70. Patient denies headaches, dizziness or vision changes. Continue carvedilol, losartan, as needed hydralazine. Hyperlipidemia.  LDL July 2024 111, LPa 104.2.  Continue atorvastatin. Will get repeat lipid panel and LFTs in the next 2 weeks.   Disposition: Lipid panel and LFTs in the next 2 weeks. Return in 3 months or sooner as needed.          Signed, Etta Grandchild. Karnell Vanderloop, DNP, NP-C

## 2022-12-15 ENCOUNTER — Ambulatory Visit: Payer: Medicare HMO | Attending: Student | Admitting: Student

## 2022-12-15 ENCOUNTER — Encounter: Payer: Self-pay | Admitting: Student

## 2022-12-15 VITALS — BP 140/80 | HR 60 | Ht 58.5 in | Wt 124.0 lb

## 2022-12-15 DIAGNOSIS — I10 Essential (primary) hypertension: Secondary | ICD-10-CM

## 2022-12-15 DIAGNOSIS — I5042 Chronic combined systolic (congestive) and diastolic (congestive) heart failure: Secondary | ICD-10-CM | POA: Diagnosis not present

## 2022-12-15 DIAGNOSIS — I2510 Atherosclerotic heart disease of native coronary artery without angina pectoris: Secondary | ICD-10-CM | POA: Diagnosis not present

## 2022-12-15 DIAGNOSIS — E785 Hyperlipidemia, unspecified: Secondary | ICD-10-CM

## 2022-12-15 DIAGNOSIS — R69 Illness, unspecified: Secondary | ICD-10-CM | POA: Diagnosis not present

## 2022-12-15 NOTE — Patient Instructions (Signed)
Medication Instructions:  NO CHANGES *If you need a refill on your cardiac medications before your next appointment, please call your pharmacy*   Lab Work: FASTING LIPIDS AND HEPATIC FUNCTION IN 1-2 WEEKS If you have labs (blood work) drawn today and your tests are completely normal, you will receive your results only by: MyChart Message (if you have MyChart) OR A paper copy in the mail If you have any lab test that is abnormal or we need to change your treatment, we will call you to review the results.   Testing/Procedures: NO TESTING   Follow-Up: At [facility], you and your health needs are our priority.  As part of our continuing mission to provide you with exceptional heart care, we have created designated Provider Care Teams.  These Care Teams include your primary Cardiologist (physician) and Advanced Practice Providers (APPs -  Physician Assistants and Nurse Practitioners) who all work together to provide you with the care you need, when you need it.   Your next appointment:   3 month(s)  Provider:   Peter Swaziland, MD OR APP

## 2022-12-16 ENCOUNTER — Ambulatory Visit: Payer: Medicare HMO | Attending: Obstetrics and Gynecology

## 2022-12-16 DIAGNOSIS — H5201 Hypermetropia, right eye: Secondary | ICD-10-CM | POA: Diagnosis not present

## 2022-12-16 DIAGNOSIS — Z8542 Personal history of malignant neoplasm of other parts of uterus: Secondary | ICD-10-CM | POA: Insufficient documentation

## 2022-12-16 DIAGNOSIS — H35373 Puckering of macula, bilateral: Secondary | ICD-10-CM | POA: Diagnosis not present

## 2022-12-16 DIAGNOSIS — I890 Lymphedema, not elsewhere classified: Secondary | ICD-10-CM | POA: Insufficient documentation

## 2022-12-16 DIAGNOSIS — H5212 Myopia, left eye: Secondary | ICD-10-CM | POA: Diagnosis not present

## 2022-12-16 DIAGNOSIS — H532 Diplopia: Secondary | ICD-10-CM | POA: Diagnosis not present

## 2022-12-16 DIAGNOSIS — H52203 Unspecified astigmatism, bilateral: Secondary | ICD-10-CM | POA: Diagnosis not present

## 2022-12-16 NOTE — Therapy (Signed)
OUTPATIENT PHYSICAL THERAPY  LOWER EXTREMITY ONCOLOGY TREATMENT  Patient Name: Beth Simon MRN: [id number] DOB:04/27/1940, 82 y.o., female Today's Date: 12/16/2022  END OF SESSION:  PT End of Session - 12/16/22 1106     Visit Number 10    Number of Visits 12    Date for PT Re-Evaluation 12/22/22    PT Start Time 1106   late   PT Stop Time 1157    PT Time Calculation (min) 51 min    Activity Tolerance Patient tolerated treatment well    Behavior During Therapy [facility] for tasks assessed/performed             Past Medical History:  Diagnosis Date   Adenomatous polyps 01/2016   Anemia    Breast cancer (HCC) 1990   bilateral mastectomy, adenoca breast-left MRM, reconstruction, chemo   Bronchitis last 2 weeks   saw dr Pete Glatter 02-28-2014, he said no antibiotics needed, nonproductive cough   Complication of anesthesia    Cystitis    cytoxen, had once or twice   Family history of breast cancer    History of breast cancer    History of radiation therapy 2/10, 2/12, 2/18, 2/24, 05/29/14   vaginal cuff/ 30 Gy/ 5 fx   Hypertension    Macular degeneration    Neck pain    taking physictal therapy last 2 weeks   Osteoporosis    On Prolia.   PONV (postoperative nausea and vomiting)    Shoulder pain    taking physical therapy for last few weeks   Uterine cancer (HCC) 03/21/2014   MLH1/PMS2 LOH   Uterine fibroid    VAIN I (vaginal intraepithelial neoplasia grade I) 2018   positive HR HPV.   Vasovagal syncopes    Past Surgical History:  Procedure Laterality Date   BREAST IMPLANT REMOVAL  6/94   left breast   BREAST RECONSTRUCTION  1990   left   CATARACT EXTRACTION Bilateral    EXCISION VAGINAL CYST     MASTECTOMY Right 10/08/06   prophylactic   RADICAL MASTECTOMY LND  1990   left, chemo done   RIGHT/LEFT HEART CATH AND CORONARY ANGIOGRAPHY N/A 10/17/2022   Procedure: RIGHT/LEFT HEART CATH AND CORONARY ANGIOGRAPHY;  Surgeon: Marykay Lex, MD;  Location: [facility] INVASIVE  CV LAB;  Service: Cardiovascular;  Laterality: N/A;   ROBOTIC ASSISTED TOTAL HYSTERECTOMY WITH BILATERAL SALPINGO OOPHERECTOMY Bilateral 03/21/2014   Procedure: ROBOTIC ASSISTED TOTAL HYSTERECTOMY WITH BILATERAL SALPINGO OOPHORECTOMY ;  Surgeon: Laurette Schimke, MD;  Location: [facility] ORS;  Service: Gynecology;  Laterality: Bilateral;   Patient Active Problem List   Diagnosis Date Noted   Chronic combined systolic and diastolic CHF (congestive heart failure) (HCC) 11/04/2022   History of non-ST elevation myocardial infarction (NSTEMI) 10/17/2022   Palpitations 10/17/2022   Hyperlipidemia 10/17/2022   Hyponatremia 10/17/2022   Fatigue 10/17/2022   History of breast cancer 10/17/2022   Personal history of colonic polyps 03/19/2020   Transient cerebral ischemia 09/15/2017   Pure hypercholesterolemia 09/15/2017   Pulmonary nodule 09/15/2017   Essential hypertension 09/15/2017   Gastro-esophageal reflux disease without esophagitis 09/15/2017   VAIN I (vaginal intraepithelial neoplasia grade I) 06/23/2017   History of uterine cancer    Family history of breast cancer    Endometrial cancer (HCC) 03/21/2014   Uterine cancer (HCC) 03/21/2014   S/P mastectomy, bilateral 05/02/2013   Situational stress 05/02/2013   Osteoporosis, unspecified 05/02/2013   Breast cancer, left breast (HCC) 05/02/2013    PCP: Merlene Laughter,  MD  REFERRING PROVIDER: Wyvonnia Lora MD  REFERRING DIAG: LE Lymphedema  THERAPY DIAG:  Lymphedema, not elsewhere classified  History of uterine cancer  ONSET DATE: around 05/2021  Rationale for Evaluation and Treatment: Rehabilitation  SUBJECTIVE:                                                                                                                                                                                           SUBJECTIVE STATEMENT:   12/16/2022 Cardiac rehab is going well but I tried the NuStep and it bothered my shoulder, but I tried the recumbent  bike and it hurt my knee and it bothers me after I do it. I used ice on it and it helped a lot. I have been wearing my compression stockings and doing the MLD. I have to use my directions but it is going well.    11/10/2022 Pt has a hx of CAD and had a NSTEMI on 10/08/22.  HER EF is 30-35%.She will start cardiac rehab on 11/13/2022. I have lost 7 lbs because I have no taste for food. I haven't done any MLD since I was here last. My BP has been high and low. Trying to get Meds stable to control it.  09/22/2022 EVAL Pt returns due to swelling in her legs, left greater than right. It comes and goes.. This morning it  seems to be down. The socks we ordered online were really good, but hot,but I ordered some online at [facility] that were thinner. When I was in [place] we did a great deal of walking. I have done my MLD occasionally and do both legs but I feel I need a refresher.. She had an injection in her right knee last Tuesday with Kenalog and she had some strange side effects; it made me feel weak in my arms and legs and caused some tingling in my arms and legs. The sensation is still there but not as severe. I have been inactive today so my legs look pretty good. Since the injection last week and feeling funny I have not done much. (Pt advised to follow up with MD about symptoms she is still having) She will be starting PT at [facility] for Right knee pain.  PERTINENT HISTORY:  History notable for left breast cancer ER-PR-  treated with radical mastectomy and chemotherapy in 1990.  Subsequently underwent right simple mastectomy 2008 without reconstruction. Denies use of tamoxifen  On 03/21/14  she underwent a robotic hysterectomy for treatment of uterine cancer, BSO. Frozen section revealed grade 1 cancer. Final pathology revealed a grade 2 lesion with LVSI present and she was staged  at stage IA. She was recommended vaginal brachytherapy as adjuvant therapy to reduce risk for local recurrence. Dr Karoline Caldwell  delivered vaginal brachytherapy completed on 05/29/14. Her treatments were without complication. Pt had therapy from 11/12/21 to 01/08/22 for bilateral LE swelling. PAIN:  Are you having pain? No  PRECAUTIONS: Bilateral LE lymphedema,s/p Left breast cancer, Recent MI (10/17/2022), now starting cardiac rehab next week  WEIGHT BEARING RESTRICTIONS: No  FALLS:  Has patient fallen in last 6 months? No  LIVING ENVIRONMENT: Lives with: lives alone Lives in: House/apartment Stairs: No;  Has following equipment at home: None  OCCUPATION: Retired  LEISURE: walks, Pilates with Roanna Raider  PRIOR LEVEL OF FUNCTION: Independent  PATIENT GOALS: Review self MLD,check present compression garments and purchase new prn,    OBJECTIVE:  COGNITION: Overall cognitive status: Within functional limits for tasks assessed   PALPATION: Tenderness left lower leg with light palpation, not so on right. Very mild pitting bilaterally mostly foot/ankle  OBSERVATIONS / OTHER ASSESSMENTS: multiple varicosities noted bilateral LE's  SENSATION: Light touch: Appears intact  POSTURE: Forward head rounded shoulders  LYMPHEDEMA ASSESSMENTS:   SURGERY TYPE/DATE: uterine cancer with total hysterectomy in 2016, 1990 L mastectomy and ALND, 2008 R mastectomy   NUMBER OF LYMPH NODES REMOVED: 0  CHEMOTHERAPY: completed for L breast cancer   RADIATION:completed  HORMONE TREATMENT: NONE  INFECTIONS: NONE  LYMPHEDEMA ASSESSMENTS:   LOWER EXTREMITY LANDMARK RIGHT eval RIGHT 10/09/2022 RIGHT 10/15/2022 RIGHT 11/10/2022 RIGHT 12/16/2022  At groin       30 cm proximal to suprapatella       20 cm proximal to suprapatella       10 cm proximal to suprapatella       At midpatella / popliteal crease       30 cm proximal to floor at lateral plantar foot 36 36 36.2 35.7 35.3  20 cm proximal to floor at lateral plantar foot 28.2 28.3 28..5 28.3 27.3  10 cm proximal to floor at lateral plantar foot 22.0 22.1 22.2  22.1 21.5  Circumference of ankle/heel       5 cm proximal to 1st MTP joint 20.3      Across MTP joint 20.5 20.3 20.3 20.2 20.1  Around proximal great toe 7 7 7.0 7.1 7.0  (Blank rows = not tested)  LOWER EXTREMITY LANDMARK LEFT eval LEFT 10/09/2022 LEFT 10/15/2022 LEFT 11/10/2022 LEFT 12/16/2022  At groin       30 cm proximal to suprapatella       20 cm proximal to suprapatella       10 cm proximal to suprapatella       At midpatella / popliteal crease       30 cm proximal to floor at lateral plantar foot 36.6 36.7 36.6 36.3 35.3  20 cm proximal to floor at lateral plantar foot 29.1 29.3 28.5 29.0 27.8  10 cm proximal to floor at lateral plantar foot 23.2 22.5 23.4 23.2 22.4  Circumference of ankle/heel       5 cm proximal to 1st MTP joint 20.7      Across MTP joint 20.5 20.3 20.4 20.3 20.2  Around proximal great toe 7.3 7.1 7.1 7.3 7.1  (Blank rows = not tested)  GAIT: Distance walked: 40 ft Assistive device utilized: None Level of assistance: Complete Independence Comments: normal gait, no difficulties  Outcome measure:  TODAY'S TREATMENT:  DATE: 12/16/2022  Measured bilateral LE circumference:excellent results throughout MLD: In Supine with HOB elevated and legs elevated on large bolster and smaller round bolster: Short neck, 5 diaphragmatic breaths, Lt inguinal nodes, (no anastomosis due to Lt ALND in 1990); and then Lt LE working from proximal to distal, then retracing all steps (with intact sequence) and ending with LN's . Pt performed  all steps on the left without directions for sequence but occasional TC's for stretch. She did very well with only a few cues required. MLD to the right LE using the right axillary Lns. Therapist then activated right inguinal  LN's and axillary LN's,right  inguinal-axillary pathway and Right LE lateral  thigh, then medial to lateral retracing steps, knee and lower leg retracing steps then right foot retracing steps and ending with LN"S. Therapist performed all on this side   11/25/2022 Left leg slightly more swollen than right and pt not wearing compression due to ingrown toenail MLD: In Supine with HOB elevated and legs elevated on large bolster and smaller round bolster: Short neck, 5 diaphragmatic breaths, Lt inguinal nodes, (no anastomosis due to Lt ALND in 1990); and then Lt LE working from proximal to distal, then retracing all steps (with intact sequence) and ending with LN's . PT performed  all steps and pt verbalized sequence to PT while PT was performing. MLD to the right LE using the right axillary Lns. Therapist then activated right LN's a axillary LN's,right  inguinal-axillary pathway and Right LE lateral thigh, then medial to lateral retracing steps, knee and lower leg retracing steps then right foot retracing steps and ending with LN"S. Therapist performed all on this side as well with verbalization to pt. Pt demonstrated strokes at LN's and  inguino-axillary pathway, and recited each sequence bilaterally.    11/10/2022  Pt remeasured bilaterally MLD: In Supine with HOB elevated and legs elevated on large bolster and smaller round bolster: Short neck, 5 diaphragmatic breaths, Lt inguinal nodes, (no anastomosis due to Lt ALND in 1990); and then Lt LE working from proximal to distal, then retracing all steps (with intact sequence) and ending with LN's . PT performed  all steps and pt verbalized sequence to PT while PT was performing. MLD to the right LE using the right axillary Lns. Therapist then activated right LN's a axillary LN's,right  inguinal-axillary pathway and Right LE lateral thigh, then medial to lateral retracing steps, knee and lower leg retracing steps then right foot retracing steps and ending with LN"S. Therapist performed all on this side as well with verbalization to  pt.  10/15/2022  MLD: In Supine with HOB elevated and legs elevated on large bolster and smaller round bolster: Short neck, 5 diaphragmatic breaths, Lt inguinal nodes, (no anastomosis due to Lt ALND in 1990); and then Lt LE working from proximal to distal, then retracing all steps (with intact sequence) and ending with LN's . Pt erformed  all steps and then therapist performed all  steps on left and right LE as below. She did very well with only a few cues required. MLD to the right LE using the right axillary Lns. Therapist then activated right LN's a axillary LN's,right  inguinal-axillary pathway and Right LE lateral thigh, then medial to lateral retracing steps, knee and lower leg retracing steps then right foot retracing steps and ending with LN"S. Therapist performed all on this side with verbalization to pt.  10/13/2022   MLD: In Supine with HOB elevated and legs elevated on large bolster and  smaller round bolster: Short neck, 5 diaphragmatic breaths, Lt inguinal nodes, (no anastomosis due to Lt ALND in 1990); and then Lt LE working from proximal to distal, then retracing all steps (with intact sequence) and ending with LN's . Therpist performed steps and then had pt repeat steps verbally and with use of hands.. She did very well with occasional VC and TC's MLD to the right LE using the right axillary Lns. Therapist then activated right LN's a axillary LN's,right  inguinal-axillary pathway and Right LE lateral thigh, then medial to lateral retracing steps, knee and lower leg retracing steps then right foot retracing steps and ending with LN"S. Therapist performed all on this side with verbalization to pt. Pt given handout demonstrating proper sequences  10/09/2022 Measured pts legs bilaterally and they remain stable with her 8-15 mmHG compression stockings MLD: In Supine with HOB elevated and legs elevated on large bolster and smaller round bolster: Short neck, 5 diaphragmatic breaths, Lt inguinal  nodes, (no anastomosis due to Lt ALND in 1990); and then Lt LE working from proximal to distal, then retracing all steps (with intact sequence) and ending with LN's . Therpist performed steps and then had pt repeat steps verbally and with use of hands.. She did very well with occasional VC and TC's MLD to the right LE using the right axillary Lns. Therapist then activated right LN's a axillary LN's,right  inguinal-axillary pathway and Right LE lateral thigh, then medial to lateral retracing steps, knee and lower leg retracing steps then right foot retracing steps and ending with LN"S. Therapist performed all on this side with verbalization to pt.  10/07/2022  MLD: In Supine with HOB elevated and legs elevated on large bolster and smaller round bolster: Short neck, 5 diaphragmatic breaths, Lt inguinal nodes, (no anastomosis due to Lt ALND in 1990); and then Lt LE working from proximal to distal, then retracing all steps (with intact sequence) and ending with LN's . Therpist performed steps and then had pt repeat all. She did very well with occasional VC and TC's MLD to the right LE using the right axillary Lns. Therapist then activated right LN's a axillary LN's,right  inguinal-axillary pathway and Right LE lateral thigh, then medial to lateral retracing steps, knee and lower leg retracing steps then right foot retracing steps and ending with LN"S. Therapist performed all on this side with verbalization to pt.    09/30/2022  Pt brought her Juzo 15-20 compression socks with her but they are too warm for this time of year per pt. Removed her 8-15 MM Hg stockings and her legs look good MLD: In Supine with HOB elevated and legs elevated on large bolster and smaller round bolster: Short neck, 5 diaphragmatic breaths, Lt inguinal nodes, (no anastomosis due to Lt ALND in 1990); and then Lt LE working from proximal to distal, then retracing all steps (with intact sequence) and ending with LN's  MLD to the right LE  using the right axillary Lns. Therapist then activated right LN's and had pt demonstrate each of the steps including axillary LN's,right  inguinal-axillary pathway and Right LE lateral thigh, then medial to lateral retracing steps, knee and lower leg retracing steps then right foot retracing steps and ending with LN"S.   09/24/22  Pt removed compression stockings; wearing [employer] knee high compression 8-15 mmhg, and legs look OK today. Pt was able to don and doff independently MLD: In Supine with HOB elevated and legs elevated on large bolster and smaller round bolster: Short  neck, 5 diaphragmatic breaths, Lt inguinal nodes, (no anastomosis due to Lt ALND in 1990); and then Lt LE working from proximal to distal, then retracing all steps (with intact sequence) and ending with LN's  MLD to the right LE using the right axillary Lns. Therapist then activated right LN's and had pt demonstrate each of the steps including axillary LN's,right  inguinal-axillary pathway and Right LE lateral thigh, then medial to lateral retracing steps, knee and lower leg retracing steps then right foot retracing steps and ending with LN"S.Pt practiced each step on the right and needs moderate VC's and visual cues.  6/24/24MLD: In Supine with HOB elevated and legs elevated on large bolster with pillows under knees: Short neck, 5 diaphragmatic breaths, Lt inguinal nodes, (no anastomosis due to Lt ALND in 1990); and then Lt LE working from proximal to distal, then retracing all steps (with intact sequence) but will continue intact for remaining sessions for Lt LE) /24/2024 Initiated MLD to left lower extremity; short neck, 5 breaths, left inguinal LN's, Lateral knee to thigh, then inner thigh to outer, repeating lateral thigh, then ankle to knee repeating both sides of leg , retracing steps  lateral thigh to hip, then left foot retracing all steps and ending with LN's at left groin.  PATIENT EDUCATION:  Education details: POC, bring  garments next discussed legs will swell if not wearing compression Person educated: Patient Education method: Explanation and Demonstration Education comprehension: verbalized understanding, requires further education  HOME EXERCISE PROGRAM: None given today  ASSESSMENT:  CLINICAL IMPRESSION: Pt did an excellent job today demonstrating MLD  on the right LE with only a few cues for proper stretch. Her legs were remeasured with excellent results throughout. She did not require any cues for sequence and was able to do without looking at her instructions. She has achieved all goals .She is discharged to independent self management.  OBJECTIVE IMPAIRMENTS: decreased knowledge of condition, decreased knowledge of use of DME, and increased edema.   ACTIVITY LIMITATIONS:  no limitations but legs swell more with being on feet for prolong periods or sitting prolonged periods  PARTICIPATION LIMITATIONS:  none  PERSONAL FACTORS:  none  are also affecting patient's functional outcome.   REHAB POTENTIAL: Excellent  CLINICAL DECISION MAKING: Stable/uncomplicated  EVALUATION COMPLEXITY: Low   GOALS: Goals reviewed with patient? Yes  SHORT TERM GOALS=LONG TERM GOALS: Target date: 10/20/2022     Pt will be independent in self MLD for long term management of lymphedema.  Baseline:  Goal status: MET 12/16/2022 2. Pt will obtain have/obtain appropriate compression garments for long term management of lymphedema.  Baseline: Goal status: IRJ:JOACZYS garments seem to be holding well  3.  Pt will be more compliant with compression garments to maintain edema reduction Baseline:  Goal status: MET   PLAN:  PT FREQUENCY: 3 visits  PT DURATION: 6 weeks   PLANNED INTERVENTIONS:  Therapeutic exercises, Patient/Family education, Self Care, Orthotic/Fit training, DME instructions, Manual lymph drainage, Compression bandaging, Vasopneumatic device, and Manual therapy   PLAN FOR NEXT SESSION:  Pt  discharged to independent self management. She knows to call with any questions or concerns.  PHYSICAL THERAPY DISCHARGE SUMMARY  Visits from Start of Care: 10  Current functional level related to goals / functional outcomes: Achieved all goals   Remaining deficits: LEGs are looking excellent   Education / Equipment: Educated in self MLD   Patient agrees to discharge. Patient goals were met. Patient is being discharged due to meeting the  stated rehab goals.  Waynette Buttery, PT 12/16/2022, 12:10 PM

## 2022-12-17 ENCOUNTER — Encounter (HOSPITAL_COMMUNITY)
Admission: RE | Admit: 2022-12-17 | Discharge: 2022-12-17 | Disposition: A | Discharge status: Home / Self Care | Payer: Medicare HMO | Source: Ambulatory Visit | Attending: Cardiology | Admitting: Cardiology

## 2022-12-17 DIAGNOSIS — I214 Non-ST elevation (NSTEMI) myocardial infarction: Secondary | ICD-10-CM | POA: Diagnosis not present

## 2022-12-19 ENCOUNTER — Encounter (HOSPITAL_COMMUNITY)
Admission: RE | Admit: 2022-12-19 | Discharge: 2022-12-19 | Disposition: A | Discharge status: Home / Self Care | Payer: Medicare HMO | Source: Ambulatory Visit | Attending: Cardiology | Admitting: Cardiology

## 2022-12-19 DIAGNOSIS — I214 Non-ST elevation (NSTEMI) myocardial infarction: Secondary | ICD-10-CM

## 2022-12-22 DIAGNOSIS — R69 Illness, unspecified: Secondary | ICD-10-CM | POA: Diagnosis not present

## 2022-12-23 NOTE — Progress Notes (Signed)
Cardiac Individual Treatment Plan  Patient Details  Name: Beth Simon MRN: [id number] Date of Birth: [birth year] Referring Provider:   Flowsheet Row INTENSIVE CARDIAC REHAB ORIENT from 11/13/2022 in [facility] for Heart, Vascular, & Lung Health  Referring Provider Peter Swazil[place], MD       Initial Encounter Date:  Flowsheet Row INTENSIVE CARDIAC REHAB ORIENT from 11/13/2022 in [facility] for Heart, Vascular, & Lung Health  Date 11/13/22       Visit Diagnosis: 10/17/22 NSTEMI (non-ST elevated myocardial infarction) [facility])  Patient's Home Medications on Admission:  Current Outpatient Medications:    aspirin EC (ASPIRIN LOW DOSE) 81 MG tablet, TAKE 1 TABLET (81 MG TOTAL) BY MOUTH DAILY. SWALLOW WHOLE., Disp: 90 tablet, Rfl: 3   atorvastatin (LIPITOR) 40 MG tablet, TAKE 1 TABLET BY MOUTH EVERY DAY, Disp: 90 tablet, Rfl: 3   carvedilol (COREG) 6.25 MG tablet, Take 1 tablet (6.25 mg total) by mouth 2 (two) times daily., Disp: 180 tablet, Rfl: 3   clopidogrel (PLAVIX) 75 MG tablet, TAKE 1 TABLET BY MOUTH DAILY WITH BREAKFAST., Disp: 90 tablet, Rfl: 3   Cyanocobalamin (VITAMIN B 12 PO), Take 1 tablet by mouth every morning. , Disp: , Rfl:    dapagliflozin propanediol (FARXIGA) 10 MG TABS tablet, Take 1 tablet (10 mg total) by mouth daily before breakfast., Disp: 90 tablet, Rfl: 3   denosumab (PROLIA) 60 MG/ML SOLN injection, Inject 60 mg into the skin every 6 (six) months. Administer in upper arm, thigh, or abdomen, Disp: , Rfl:    Flaxseed, Linseed, (FLAX SEEDS PO), Take 1 scoop by mouth every morning. Ground flaxseed in cereal., Disp: , Rfl:    hydrALAZINE (APRESOLINE) 25 MG tablet, Take 1 tablet (25 mg total) by mouth as needed (for blood pressure greater than 160/100. May take up to twice daily.)., Disp: 180 tablet, Rfl: 3   losartan (COZAAR) 25 MG tablet, Take 1 tablet (25 mg total) by mouth daily., Disp: 90 tablet, Rfl: 3   Multiple  Vitamins-Minerals (PRESERVISION AREDS) CAPS, Take 1 capsule by mouth daily., Disp: , Rfl:    OVER THE COUNTER MEDICATION, Take 1 tablet by mouth every morning. Astazanthin. (anti-oxidant), Disp: , Rfl:    VITAMIN D, CHOLECALCIFEROL, PO, Take 4,000 Int'l Units by mouth every morning. , Disp: , Rfl:   Past Medical History: Past Medical History:  Diagnosis Date   Adenomatous polyps 01/2016   Anemia    Breast cancer (HCC) 1990   bilateral mastectomy, adenoca breast-left MRM, reconstruction, chemo   Bronchitis last 2 weeks   saw dr Pete Glatter 02-28-2014, he said no antibiotics needed, nonproductive cough   Complication of anesthesia    Cystitis    cytoxen, had once or twice   Family history of breast cancer    History of breast cancer    History of radiation therapy 2/10, 2/12, 2/18, 2/24, 05/29/14   vaginal cuff/ 30 Gy/ 5 fx   Hypertension    Macular degeneration    Neck pain    taking physictal therapy last 2 weeks   Osteoporosis    On Prolia.   PONV (postoperative nausea and vomiting)    Shoulder pain    taking physical therapy for last few weeks   Uterine cancer (HCC) 03/21/2014   MLH1/PMS2 LOH   Uterine fibroid    VAIN I (vaginal intraepithelial neoplasia grade I) 2018   positive HR HPV.   Vasovagal syncopes     Tobacco Use: Social History  Tobacco Use  Smoking Status Never  Smokeless Tobacco Never    Labs: Review Flowsheet       Latest Ref Rng & Units 10/07/2013 10/17/2022  Labs for ITP Cardiac and Pulmonary Rehab  Cholestrol 0 - 200 mg/dL - 295   LDL (calc) 0 - 99 mg/dL - 284   HDL-C >13 mg/dL - 78   Trlycerides <244 mg/dL - 79   Hemoglobin W1U 4.8 - 5.6 % 5.9  5.7   PH, Arterial 7.35 - 7.45 - 7.410   PCO2 arterial 32 - 48 mmHg - 33.2   Bicarbonate 20.0 - 28.0 mmol/L 20.0 - 28.0 mmol/L - 22.2  23.8  21.0   TCO2 22 - 32 mmol/L 22 - 32 mmol/L - 23  25  22  23    Acid-base deficit 0.0 - 2.0 mmol/L - 2.0  3.0   O2 Saturation % % - 64  63  94     Details        Multiple values from one day are sorted in reverse-chronological order         Capillary Blood Glucose: Lab Results  Component Value Date   GLUCAP 80 05/09/2014     Exercise Target Goals: Exercise Program Goal: Individual exercise prescription set using results from initial 6 min walk test and THRR while considering  patient's activity barriers and safety.   Exercise Prescription Goal: Initial exercise prescription builds to 30-45 minutes a day of aerobic activity, 2-3 days per week.  Home exercise guidelines will be given to patient during program as part of exercise prescription that the participant will acknowledge.  Activity Barriers & Risk Stratification:  Activity Barriers & Cardiac Risk Stratification - 11/13/22 1148       Activity Barriers & Cardiac Risk Stratification   Activity Barriers Decreased Ventricular Function;Chest Pain/Angina    Cardiac Risk Stratification High   <5 METs on            6 Minute Walk:  6 Minute Walk     Row Name 11/13/22 1145         6 Minute Walk   Phase Initial     Distance 480 feet     Walk Time 6 minutes     # of Rest Breaks 1  2:00-6:00     MPH 0.91     METS 0.83     RPE 13     Perceived Dyspnea  0     VO2 Peak 2.91     Symptoms Yes (comment)     Comments Chest pressure reported at 2 min mark. Test aborted, BP 190/72, 98% NSR. No other symptoms reported, pressure resolved once seated. RN Corrie Dandy made aware, contacted Dr. Swaziland.     Resting HR 62 bpm     Resting BP 148/72     Resting Oxygen Saturation  97 %     Exercise Oxygen Saturation  during 6 min walk 98 %     Max Ex. HR 82 bpm     Max Ex. BP 190/72     2 Minute Post BP 188/70              Oxygen Initial Assessment:   Oxygen Re-Evaluation:   Oxygen Discharge (Final Oxygen Re-Evaluation):   Initial Exercise Prescription:  Initial Exercise Prescription - 11/13/22 1100       Date of Initial Exercise RX and Referring Provider   Date  11/13/22    Referring Provider Peter [place]azil[place], MD  Expected Discharge Date 02/04/23      NuStep   Level 1    SPM 60    Minutes 15    METs 1.8      Track   Laps 15    Minutes 15    METs 2      Prescription Details   Frequency (times per week) 3    Duration Progress to 30 minutes of continuous aerobic without signs/symptoms of physical distress      Intensity   THRR 40-80% of Max Heartrate 55-110    Ratings of Perceived Exertion 11-13    Perceived Dyspnea 0-4      Progression   Progression Continue progressive overload as per policy without signs/symptoms or physical distress.      Resistance Training   Training Prescription Yes    Weight 2    Reps 10-15             Perform Capillary Blood Glucose checks as needed.  Exercise Prescription Changes:   Exercise Prescription Changes     Row Name 11/21/22 1300 12/03/22 1500 12/19/22 1500         Response to Exercise   Blood Pressure (Admit) 140/72 126/72 122/70     Blood Pressure (Exercise) 154/72 134/70 132/70     Blood Pressure (Exit) 132/70 130/70 116/62     Heart Rate (Admit) 61 bpm 56 bpm 61 bpm     Heart Rate (Exercise) 82 bpm 105 bpm 92 bpm     Heart Rate (Exit) 59 bpm 64 bpm 69 bpm     Rating of Perceived Exertion (Exercise) 9 12 13      Symptoms None None none     Comments Pt's first day in the CRP2 program Reviewed METs Reviewed METs and brainstorimg HEP     Duration Continue with 30 min of aerobic exercise without signs/symptoms of physical distress. Continue with 30 min of aerobic exercise without signs/symptoms of physical distress. Continue with 30 min of aerobic exercise without signs/symptoms of physical distress.     Intensity THRR unchanged THRR unchanged THRR unchanged       Progression   Progression Continue to progress workloads to maintain intensity without signs/symptoms of physical distress. Continue to progress workloads to maintain intensity without signs/symptoms of physical distress.  Continue to progress workloads to maintain intensity without signs/symptoms of physical distress.     Average METs 1.75 2.7 3.4       Resistance Training   Training Prescription Yes No Yes     Weight 2 lbs No weights on wednesdays 2     Reps 10-15 -- 10-15     Time 10 Minutes -- 10 Minutes       Interval Training   Interval Training No No --       Recumbant Bike   Level -- 1 --     RPM -- 75 --     Watts -- 16 --     Minutes -- 15 --     METs -- 2 --       NuStep   Level 1 -- --     SPM 45 -- --     Minutes 15 -- --     METs 1.2 -- --       Track   Laps 10 19 20      Minutes 15 15 15      METs 2.28 3.43 3.55              Exercise  Comments:   Exercise Comments     Row Name 11/21/22 1402 12/03/22 1500         Exercise Comments Pt's first day in the CRP2 program. Pt exercised today without complaints. Reviewed METs. Pt is making good progess, peak METs are 3.4.  Changed to recumebnt bike today because nustep was bothering her back. Back feels more comfortable on bike.               Exercise Goals and Review:   Exercise Goals     Row Name 11/13/22 0821             Exercise Goals   Increase Physical Activity Yes       Intervention Provide advice, education, support and counseling about physical activity/exercise needs.;Develop an individualized exercise prescription for aerobic and resistive training based on initial evaluation findings, risk stratification, comorbidities and participant's personal goals.       Expected Outcomes Short Term: Attend rehab on a regular basis to increase amount of physical activity.;Long Term: Exercising regularly at least 3-5 days a week.;Long Term: Add in home exercise to make exercise part of routine and to increase amount of physical activity.       Increase Strength and Stamina Yes       Intervention Provide advice, education, support and counseling about physical activity/exercise needs.;Develop an individualized exercise  prescription for aerobic and resistive training based on initial evaluation findings, risk stratification, comorbidities and participant's personal goals.       Expected Outcomes Short Term: Perform resistance training exercises routinely during rehab and add in resistance training at home;Short Term: Increase workloads from initial exercise prescription for resistance, speed, and METs.;Long Term: Improve cardiorespiratory fitness, muscular endurance and strength as measured by increased METs and functional capacity ( )       Able to understand and use rate of perceived exertion (RPE) scale Yes       Intervention Provide education and explanation on how to use RPE scale       Expected Outcomes Short Term: Able to use RPE daily in rehab to express subjective intensity level;Long Term:  Able to use RPE to guide intensity level when exercising independently       Knowledge and understanding of Target Heart Rate Range (THRR) Yes       Intervention Provide education and explanation of THRR including how the numbers were predicted and where they are located for reference       Expected Outcomes Short Term: Able to state/look up THRR;Short Term: Able to use daily as guideline for intensity in rehab;Long Term: Able to use THRR to govern intensity when exercising independently       Understanding of Exercise Prescription Yes       Intervention Provide education, explanation, and written materials on patient's individual exercise prescription       Expected Outcomes Short Term: Able to explain program exercise prescription;Long Term: Able to explain home exercise prescription to exercise independently                Exercise Goals Re-Evaluation :  Exercise Goals Re-Evaluation     Row Name 11/21/22 1401 12/19/22 1503           Exercise Goal Re-Evaluation   Exercise Goals Review Increase Physical Activity;Increase Strength and Stamina;Able to understand and use rate of perceived exertion (RPE)  scale;Knowledge and understanding of Target Heart Rate Range (THRR);Understanding of Exercise Prescription --      Comments Pt's first day in  the CRP2 program. Pt understands the THRR, RPE sclae and exercise Rx. Reviewed METs and Goals and discussed HEP with pt. Pt is proud of her exercise progress but admits she is not yet independent in preparing meals for herself at home. She admits to buying [employer] ch[employer] and adding more vegetables to make it healthier. I asked pt to meet with our dietician to help her reach her goals.  Pt reports that she likes to walk but feels unsafe walking by herself. She does walk x1/week outdoors with her church group and attends a pilates class x1/week but wants to do more. She does not like going to the [facility], I will brainstorm and create a definitive plan with patient soon.      Expected Outcomes Will continue to monitor patient and progress exercise workloads as tolerated. --               Discharge Exercise Prescription (Final Exercise Prescription Changes):  Exercise Prescription Changes - 12/19/22 1500       Response to Exercise   Blood Pressure (Admit) 122/70    Blood Pressure (Exercise) 132/70    Blood Pressure (Exit) 116/62    Heart Rate (Admit) 61 bpm    Heart Rate (Exercise) 92 bpm    Heart Rate (Exit) 69 bpm    Rating of Perceived Exertion (Exercise) 13    Symptoms none    Comments Reviewed METs and brainstorimg HEP    Duration Continue with 30 min of aerobic exercise without signs/symptoms of physical distress.    Intensity THRR unchanged      Progression   Progression Continue to progress workloads to maintain intensity without signs/symptoms of physical distress.    Average METs 3.4      Resistance Training   Training Prescription Yes    Weight 2    Reps 10-15    Time 10 Minutes      Track   Laps 20    Minutes 15    METs 3.55             Nutrition:  Target Goals: Understanding of nutrition guidelines, daily intake of sodium  1500mg , cholesterol 200mg , calories 30% from fat and 7% or less from saturated fats, daily to have 5 or more servings of fruits and vegetables.  Biometrics:  Pre Biometrics - 11/13/22 0804       Pre Biometrics   Waist Circumference 34.5 inches    Hip Circumference 39 inches    Waist to Hip Ratio 0.88 %    Triceps Skinfold 26 mm    % Body Fat 39.6 %    Grip Strength 22 kg    Flexibility 12 in    Single Leg Stand 13.2 seconds              Nutrition Therapy Plan and Nutrition Goals:  Nutrition Therapy & Goals - 12/22/22 1624       Nutrition Therapy   Diet Heart Healthy Diet    Drug/Food Interactions Statins/Certain Fruits      Personal Nutrition Goals   Nutrition Goal Patient to identify strategies for reducing cardiovascular risk by attending the [employer] education and nutrition series weekly.   goal in progress.   Personal Goal #2 Patient to improve diet quality by using the plate method as a guide for meal planning to include lean protein/plant protein, fruits, vegetables, whole grains, nonfat dairy as part of a well-balanced diet   goal in progress.   Personal Goal #3 Patient to decrease  sodium 1500mg  per day   goal in progress.   Comments Goals in progress. Jaonna continues to attend the [employer] and nutrition series regularly. Prior to starting cardiac rehab, Dezerae reports that she has already started making some dietary changes including eating three meals daily and reduced sugar/refined carbohydrate intake. She reports weight loss of ~8# since initial NSTEMI hospitalization on 7/19 at 132#. She is motivated to continue to lose weight with goal weight of ~115#. She has maintained her weight since starting with our program. She is currently using [employer] meal delivery services (14 days worth of meals). Patient will benefit from participation in intensive cardiac rehab for nutrition, exercise, and lifestyle modification.      Intervention Plan   Intervention  Prescribe, educate and counsel regarding individualized specific dietary modifications aiming towards targeted core components such as weight, hypertension, lipid management, diabetes, heart failure and other comorbidities.;Nutrition handout(s) given to patient.    Expected Outcomes Short Term Goal: Understand basic principles of dietary content, such as calories, fat, sodium, cholesterol and nutrients.;Long Term Goal: Adherence to prescribed nutrition plan.             Nutrition Assessments:  Nutrition Assessments - 11/20/22 1448       Rate Your Plate Scores   Pre Score 76            MEDIFICTS Score Key: >=70 Need to make dietary changes  40-70 Heart Healthy Diet <= 40 Therapeutic Level Cholesterol Diet   Flowsheet Row INTENSIVE CARDIAC REHAB from 11/19/2022 in [facility] for Heart, Vascular, & Lung Health  Picture Your Plate Total Score on Admission 76      Picture Your Plate Scores: <16 Unhealthy dietary pattern with much room for improvement. 41-50 Dietary pattern unlikely to meet recommendations for good health and room for improvement. 51-60 More healthful dietary pattern, with some room for improvement.  >60 Healthy dietary pattern, although there may be some specific behaviors that could be improved.    Nutrition Goals Re-Evaluation:  Nutrition Goals Re-Evaluation     Row Name 11/21/22 1304 12/22/22 1624           Goals   Current Weight 127 lb 3.3 oz (57.7 kg) 126 lb 5.2 oz (57.3 kg)      Comment lipoproteinA 104, A1c 5.7, cholesterol 205, LDL 111 no new labs; most recent labs lipoproteinA 104, A1c 5.7, cholesterol 205, LDL 111 (lipitor)      Expected Outcome Magdaline reports that she has already started making some dietary changes including eating three meals daily and reduced sugar/refined carbohydrate intake. She reports weight loss of ~7# since initial NSTEMI hospitalization on 7/19 at 132#. She is motivated to continue to lose  weight with goal weight of ~115#. She is currently using [employer] meal delivery services (14 days worth of meals). Patient will benefit from participation in intensive cardiac rehab for nutrition, exercise, and lifestyle modification. Goals in progress. Saaya continues to attend the [employer] and nutrition series regularly. Prior to starting cardiac rehab, Leesa reports that she has already started making some dietary changes including eating three meals daily and reduced sugar/refined carbohydrate intake. She reports weight loss of ~8# since initial NSTEMI hospitalization on 7/19 at 132#. She is motivated to continue to lose weight with goal weight of ~115#. She has maintained her weight since starting with our program. She is currently using [employer] meal delivery services (14 days worth of meals). Patient will benefit from participation in intensive cardiac rehab  for nutrition, exercise, and lifestyle modification.               Nutrition Goals Re-Evaluation:  Nutrition Goals Re-Evaluation     Row Name 11/21/22 1304 12/22/22 1624           Goals   Current Weight 127 lb 3.3 oz (57.7 kg) 126 lb 5.2 oz (57.3 kg)      Comment lipoproteinA 104, A1c 5.7, cholesterol 205, LDL 111 no new labs; most recent labs lipoproteinA 104, A1c 5.7, cholesterol 205, LDL 111 (lipitor)      Expected Outcome Nicki reports that she has already started making some dietary changes including eating three meals daily and reduced sugar/refined carbohydrate intake. She reports weight loss of ~7# since initial NSTEMI hospitalization on 7/19 at 132#. She is motivated to continue to lose weight with goal weight of ~115#. She is currently using [employer] meal delivery services (14 days worth of meals). Patient will benefit from participation in intensive cardiac rehab for nutrition, exercise, and lifestyle modification. Goals in progress. Crisel continues to attend the [employer] and nutrition series  regularly. Prior to starting cardiac rehab, Rosangelica reports that she has already started making some dietary changes including eating three meals daily and reduced sugar/refined carbohydrate intake. She reports weight loss of ~8# since initial NSTEMI hospitalization on 7/19 at 132#. She is motivated to continue to lose weight with goal weight of ~115#. She has maintained her weight since starting with our program. She is currently using [employer] meal delivery services (14 days worth of meals). Patient will benefit from participation in intensive cardiac rehab for nutrition, exercise, and lifestyle modification.               Nutrition Goals Discharge (Final Nutrition Goals Re-Evaluation):  Nutrition Goals Re-Evaluation - 12/22/22 1624       Goals   Current Weight 126 lb 5.2 oz (57.3 kg)    Comment no new labs; most recent labs lipoproteinA 104, A1c 5.7, cholesterol 205, LDL 111 (lipitor)    Expected Outcome Goals in progress. Dezaree continues to attend the [employer] and nutrition series regularly. Prior to starting cardiac rehab, Yittel reports that she has already started making some dietary changes including eating three meals daily and reduced sugar/refined carbohydrate intake. She reports weight loss of ~8# since initial NSTEMI hospitalization on 7/19 at 132#. She is motivated to continue to lose weight with goal weight of ~115#. She has maintained her weight since starting with our program. She is currently using [employer] meal delivery services (14 days worth of meals). Patient will benefit from participation in intensive cardiac rehab for nutrition, exercise, and lifestyle modification.             Psychosocial: Target Goals: Acknowledge presence or absence of significant depression and/or stress, maximize coping skills, provide positive support system. Participant is able to verbalize types and ability to use techniques and skills needed for reducing stress and  depression.  Initial Review & Psychosocial Screening:  Initial Psych Review & Screening - 11/13/22 1150       Initial Review   Current issues with None Identified      Family Dynamics   Good Support System? Yes   Landa has her son and friends for support     Barriers   Psychosocial barriers to participate in program There are no identifiable barriers or psychosocial needs.      Screening Interventions   Interventions Encouraged to exercise;Provide feedback about the scores to participant  Expected Outcomes Short Term goal: Identification and review with participant of any Quality of Life or Depression concerns found by scoring the questionnaire.;Long Term goal: The participant improves quality of Life and PHQ9 Scores as seen by post scores and/or verbalization of changes             Quality of Life Scores:  Quality of Life - 11/13/22 1151       Quality of Life   Select Quality of Life      Quality of Life Scores   Health/Function Pre 28.18 %    Socioeconomic Pre 27.75 %    Psych/Spiritual Pre 28.93 %    Family Pre 27 %    GLOBAL Pre 28.11 %            Scores of 19 and below usually indicate a poorer quality of life in these areas.  A difference of  2-3 points is a clinically meaningful difference.  A difference of 2-3 points in the total score of the Quality of Life Index has been associated with significant improvement in overall quality of life, self-image, physical symptoms, and general health in studies assessing change in quality of life.  PHQ-9: Review Flowsheet  More data exists      11/13/2022 10/16/2017 10/10/2016 10/05/2015 01/05/2015  Depression screen PHQ 2/9  Decreased Interest 1 0 0 0 1  Down, Depressed, Hopeless 0 0 0 0 1  PHQ - 2 Score 1 0 0 0 2  Altered sleeping 0 - - - -  Tired, decreased energy 1 - - - -  Change in appetite 1 - - - -  Feeling bad or failure about yourself  0 - - - -  Trouble concentrating 0 - - - -  Moving slowly or  fidgety/restless 0 - - - -  Suicidal thoughts 0 - - - -  PHQ-9 Score 3 - - - -  Difficult doing work/chores Not difficult at all - - - -    Details           Interpretation of Total Score  Total Score Depression Severity:  1-4 = Minimal depression, 5-9 = Mild depression, 10-14 = Moderate depression, 15-19 = Moderately severe depression, 20-27 = Severe depression   Psychosocial Evaluation and Intervention:   Psychosocial Re-Evaluation:  Psychosocial Re-Evaluation     Row Name 11/21/22 1435 12/23/22 1639           Psychosocial Re-Evaluation   Current issues with None Identified None Identified      Comments No psychosocial needs identified, no interventions needed No psychosocial needs identified, no interventions needed      Interventions Encouraged to attend Cardiac Rehabilitation for the exercise Encouraged to attend Cardiac Rehabilitation for the exercise      Continue Psychosocial Services  No Follow up required No Follow up required               Psychosocial Discharge (Final Psychosocial Re-Evaluation):  Psychosocial Re-Evaluation - 12/23/22 1639       Psychosocial Re-Evaluation   Current issues with None Identified    Comments No psychosocial needs identified, no interventions needed    Interventions Encouraged to attend Cardiac Rehabilitation for the exercise    Continue Psychosocial Services  No Follow up required             Vocational Rehabilitation: Provide vocational rehab assistance to qualifying candidates.   Vocational Rehab Evaluation & Intervention:  Vocational Rehab - 11/13/22 1610  Initial Vocational Rehab Evaluation & Intervention   Assessment shows need for Vocational Rehabilitation No   [place] is retired            Education: Education Goals: Education classes will be provided on a weekly basis, covering required topics. Participant will state understanding/return demonstration of topics presented.    Education      Row Name 11/21/22 1300     Education   Cardiac Education Topics Pritikin   Hospital doctor Education   General Education Heart Disease Risk Reduction   Instruction Review Code 1- Verbalizes Understanding   Class Start Time 1146   Class Stop Time 1223   Class Time Calculation (min) 37 min    Row Name 11/26/22 1400     Education   Cardiac Education Topics Pritikin   Customer service manager   Weekly Topic Fast and Healthy Breakfasts   Instruction Review Code 1- Verbalizes Understanding   Class Start Time 1140   Class Stop Time 1225   Class Time Calculation (min) 45 min    Row Name 11/26/22 1500     Education   Cardiac Education Topics Pritikin   Customer service manager   Weekly Topic Fast and Healthy Breakfasts   Instruction Review Code 1- Verbalizes Understanding   Class Start Time 1400   Class Stop Time 1445   Class Time Calculation (min) 45 min    Row Name 11/28/22 1300     Education   Cardiac Education Topics Pritikin   Writer Psychosocial   Psychosocial Healthy Minds, Bodies, Hearts   Instruction Review Code 1- Verbalizes Understanding   Class Start Time 1145   Class Stop Time 1222   Class Time Calculation (min) 37 min    Row Name 12/03/22 1400     Education   Cardiac Education Topics Pritikin   Customer service manager   Weekly Topic Personalizing Your Pritikin Plate   Instruction Review Code 1- Verbalizes Understanding   Class Start Time 1150   Class Stop Time 1225   Class Time Calculation (min) 35 min    Row Name 12/05/22 1300     Education   Cardiac Education Topics Pritikin   [employer] Workshops     Workshops   Educator Exercise Physiologist   Select Exercise   Exercise Workshop Location manager and  Fall Prevention   Instruction Review Code 1- Verbalizes Understanding   Class Start Time 1151   Class Stop Time 1238   Class Time Calculation (min) 47 min    Row Name 12/10/22 1400     Education   Cardiac Education Topics Pritikin   Customer service manager   Weekly Topic [employer] Desserts   Instruction Review Code 1- Verbalizes Understanding   Class Start Time 1145   Class Stop Time 1230   Class Time Calculation (min) 45 min    Row Name 12/17/22 1400     Education   Cardiac Education Topics Pritikin   Customer service manager   Weekly Topic Tasty Appetizers and Snacks   Instruction Review  Code 1- Verbalizes Understanding   Class Start Time 1145   Class Stop Time 1225   Class Time Calculation (min) 40 min    Row Name 12/19/22 1200     Education   Cardiac Education Topics Pritikin   Select Core Videos     Core Videos   Educator Exercise Physiologist   Select Nutrition   Nutrition Calorie Density   Instruction Review Code 1- Verbalizes Understanding   Class Start Time 1155   Class Stop Time 1235   Class Time Calculation (min) 40 min            Core Videos: Exercise    Move It!  Clinical staff conducted group or individual video education with verbal and written material and guidebook.  Patient learns the recommended Pritikin exercise program. Exercise with the goal of living a long, healthy life. Some of the health benefits of exercise include controlled diabetes, healthier blood pressure levels, improved cholesterol levels, improved heart and lung capacity, improved sleep, and better body composition. Everyone should speak with their doctor before starting or changing an exercise routine.  Biomechanical Limitations Clinical staff conducted group or individual video education with verbal and written material and guidebook.  Patient learns how biomechanical limitations can impact  exercise and how we can mitigate and possibly overcome limitations to have an impactful and balanced exercise routine.  Body Composition Clinical staff conducted group or individual video education with verbal and written material and guidebook.  Patient learns that body composition (ratio of muscle mass to fat mass) is a key component to assessing overall fitness, rather than body weight alone. Increased fat mass, especially visceral belly fat, can put [place] at increased risk for metabolic syndrome, type 2 diabetes, heart disease, and even death. It is recommended to combine diet and exercise (cardiovascular and resistance training) to improve your body composition. Seek guidance from your physician and exercise physiologist before implementing an exercise routine.  Exercise Action Plan Clinical staff conducted group or individual video education with verbal and written material and guidebook.  Patient learns the recommended strategies to achieve and enjoy long-term exercise adherence, including variety, self-motivation, self-efficacy, and positive decision making. Benefits of exercise include fitness, good health, weight management, more energy, better sleep, less stress, and overall well-being.  Medical   Heart Disease Risk Reduction Clinical staff conducted group or individual video education with verbal and written material and guidebook.  Patient learns our heart is our most vital organ as it circulates oxygen, nutrients, white blood cells, and hormones throughout the entire body, and carries waste away. Data supports a plant-based eating plan like the Pritikin Program for its effectiveness in slowing progression of and reversing heart disease. The video provides a number of recommendations to address heart disease.   Metabolic Syndrome and Belly Fat  Clinical staff conducted group or individual video education with verbal and written material and guidebook.  Patient learns what metabolic  syndrome is, how it leads to heart disease, and how one can reverse it and keep it from coming back. You have metabolic syndrome if you have 3 of the following 5 criteria: abdominal obesity, high blood pressure, high triglycerides, low HDL cholesterol, and high blood sugar.  Hypertension and Heart Disease Clinical staff conducted group or individual video education with verbal and written material and guidebook.  Patient learns that high blood pressure, or hypertension, is very common in the [place]. Hypertension is largely due to excessive salt intake, but other important risk factors include being  overweight, physical inactivity, drinking too much alcohol, smoking, and not eating enough potassium from fruits and vegetables. High blood pressure is a leading risk factor for heart attack, stroke, congestive heart failure, dementia, kidney failure, and premature death. Long-term effects of excessive salt intake include stiffening of the arteries and thickening of heart muscle and organ damage. Recommendations include ways to reduce hypertension and the risk of heart disease.  Diseases of Our Time - Focusing on Diabetes Clinical staff conducted group or individual video education with verbal and written material and guidebook.  Patient learns why the best way to stop diseases of our time is prevention, through food and other lifestyle changes. Medicine (such as prescription pills and surgeries) is often only a Band-Aid on the problem, not a long-term solution. Most common diseases of our time include obesity, type 2 diabetes, hypertension, heart disease, and cancer. The Pritikin Program is recommended and has been proven to help reduce, reverse, and/or prevent the damaging effects of metabolic syndrome.  Nutrition   Overview of the Pritikin Eating Plan  Clinical staff conducted group or individual video education with verbal and written material and guidebook.  Patient learns about the Pritikin  Eating Plan for disease risk reduction. The Pritikin Eating Plan emphasizes a wide variety of unrefined, minimally-processed carbohydrates, like fruits, vegetables, whole grains, and legumes. Go, Caution, and Stop food choices are explained. Plant-based and lean animal proteins are emphasized. Rationale provided for low sodium intake for blood pressure control, low added sugars for blood sugar stabilization, and low added fats and oils for coronary artery disease risk reduction and weight management.  Calorie Density  Clinical staff conducted group or individual video education with verbal and written material and guidebook.  Patient learns about calorie density and how it impacts the Pritikin Eating Plan. Knowing the characteristics of the food you choose will help you decide whether those foods will lead to weight gain or weight loss, and whether you want to consume more or less of them. Weight loss is usually a side effect of the Pritikin Eating Plan because of its focus on low calorie-dense foods.  Label Reading  Clinical staff conducted group or individual video education with verbal and written material and guidebook.  Patient learns about the Pritikin recommended label reading guidelines and corresponding recommendations regarding calorie density, added sugars, sodium content, and whole grains.  Dining Out - Part 1  Clinical staff conducted group or individual video education with verbal and written material and guidebook.  Patient learns that restaurant meals can be sabotaging because they can be so high in calories, fat, sodium, and/or sugar. Patient learns recommended strategies on how to positively address this and avoid unhealthy pitfalls.  Facts on Fats  Clinical staff conducted group or individual video education with verbal and written material and guidebook.  Patient learns that lifestyle modifications can be just as effective, if not more so, as many medications for lowering your  risk of heart disease. A Pritikin lifestyle can help to reduce your risk of inflammation and atherosclerosis (cholesterol build-up, or plaque, in the artery walls). Lifestyle interventions such as dietary choices and physical activity address the cause of atherosclerosis. A review of the types of fats and their impact on blood cholesterol levels, along with dietary recommendations to reduce fat intake is also included.  Nutrition Action Plan  Clinical staff conducted group or individual video education with verbal and written material and guidebook.  Patient learns how to incorporate Pritikin recommendations into their lifestyle. Recommendations include  planning and keeping personal health goals in mind as an important part of their success.  Healthy Mind-Set    Healthy Minds, Bodies, Hearts  Clinical staff conducted group or individual video education with verbal and written material and guidebook.  Patient learns how to identify when they are stressed. Video will discuss the impact of that stress, as well as the many benefits of stress management. Patient will also be introduced to stress management techniques. The way we think, act, and feel has an impact on our hearts.  How Our Thoughts Can Heal Our Hearts  Clinical staff conducted group or individual video education with verbal and written material and guidebook.  Patient learns that negative thoughts can cause depression and anxiety. This can result in negative lifestyle behavior and serious health problems. Cognitive behavioral therapy is an effective method to help control our thoughts in order to change and improve our emotional outlook.  Additional Videos:  Exercise    Improving Performance  Clinical staff conducted group or individual video education with verbal and written material and guidebook.  Patient learns to use a non-linear approach by alternating intensity levels and lengths of time spent exercising to help burn more calories  and lose more body fat. Cardiovascular exercise helps improve heart health, metabolism, hormonal balance, blood sugar control, and recovery from fatigue. Resistance training improves strength, endurance, balance, coordination, reaction time, metabolism, and muscle mass. Flexibility exercise improves circulation, posture, and balance. Seek guidance from your physician and exercise physiologist before implementing an exercise routine and learn your capabilities and proper form for all exercise.  Introduction to Yoga  Clinical staff conducted group or individual video education with verbal and written material and guidebook.  Patient learns about yoga, a discipline of the coming together of mind, breath, and body. The benefits of yoga include improved flexibility, improved range of motion, better posture and core strength, increased lung function, weight loss, and positive self-image. Yoga's heart health benefits include lowered blood pressure, healthier heart rate, decreased cholesterol and triglyceride levels, improved immune function, and reduced stress. Seek guidance from your physician and exercise physiologist before implementing an exercise routine and learn your capabilities and proper form for all exercise.  Medical   Aging: Enhancing Your Quality of Life  Clinical staff conducted group or individual video education with verbal and written material and guidebook.  Patient learns key strategies and recommendations to stay in good physical health and enhance quality of life, such as prevention strategies, having an advocate, securing a Health Care Proxy and Power of Attorney, and keeping a list of medications and system for tracking them. It also discusses how to avoid risk for bone loss.  Biology of Weight Control  Clinical staff conducted group or individual video education with verbal and written material and guidebook.  Patient learns that weight gain occurs because we consume more calories than  we burn (eating more, moving less). Even if your body weight is normal, you may have higher ratios of fat compared to muscle mass. Too much body fat puts you at increased risk for cardiovascular disease, heart attack, stroke, type 2 diabetes, and obesity-related cancers. In addition to exercise, following the Pritikin Eating Plan can help reduce your risk.  Decoding Lab Results  Clinical staff conducted group or individual video education with verbal and written material and guidebook.  Patient learns that lab test reflects one measurement whose values change over time and are influenced by many factors, including medication, stress, sleep, exercise, food, hydration, pre-existing medical  conditions, and more. It is recommended to use the knowledge from this video to become more involved with your lab results and evaluate your numbers to speak with your doctor.   Diseases of Our Time - Overview  Clinical staff conducted group or individual video education with verbal and written material and guidebook.  Patient learns that according to the CDC, 50% to 70% of chronic diseases (such as obesity, type 2 diabetes, elevated lipids, hypertension, and heart disease) are avoidable through lifestyle improvements including healthier food choices, listening to satiety cues, and increased physical activity.  Sleep Disorders Clinical staff conducted group or individual video education with verbal and written material and guidebook.  Patient learns how good quality and duration of sleep are important to overall health and well-being. Patient also learns about sleep disorders and how they impact health along with recommendations to address them, including discussing with a physician.  Nutrition  Dining Out - Part 2 Clinical staff conducted group or individual video education with verbal and written material and guidebook.  Patient learns how to plan ahead and communicate in order to maximize their dining experience  in a healthy and nutritious manner. Included are recommended food choices based on the type of restaurant the patient is visiting.   Fueling a Banker conducted group or individual video education with verbal and written material and guidebook.  There is a strong connection between our food choices and our health. Diseases like obesity and type 2 diabetes are very prevalent and are in large-part due to lifestyle choices. The Pritikin Eating Plan provides plenty of food and hunger-curbing satisfaction. It is easy to follow, affordable, and helps reduce health risks.  Menu Workshop  Clinical staff conducted group or individual video education with verbal and written material and guidebook.  Patient learns that restaurant meals can sabotage health goals because they are often packed with calories, fat, sodium, and sugar. Recommendations include strategies to plan ahead and to communicate with the manager, chef, or server to help order a healthier meal.  Planning Your Eating Strategy  Clinical staff conducted group or individual video education with verbal and written material and guidebook.  Patient learns about the Pritikin Eating Plan and its benefit of reducing the risk of disease. The Pritikin Eating Plan does not focus on calories. Instead, it emphasizes high-quality, nutrient-rich foods. By knowing the characteristics of the foods, we choose, we can determine their calorie density and make informed decisions.  Targeting Your Nutrition Priorities  Clinical staff conducted group or individual video education with verbal and written material and guidebook.  Patient learns that lifestyle habits have a tremendous impact on disease risk and progression. This video provides eating and physical activity recommendations based on your personal health goals, such as reducing LDL cholesterol, losing weight, preventing or controlling type 2 diabetes, and reducing high blood  pressure.  Vitamins and Minerals  Clinical staff conducted group or individual video education with verbal and written material and guidebook.  Patient learns different ways to obtain key vitamins and minerals, including through a recommended healthy diet. It is important to discuss all supplements you take with your doctor.   Healthy Mind-Set    Smoking Cessation  Clinical staff conducted group or individual video education with verbal and written material and guidebook.  Patient learns that cigarette smoking and tobacco addiction pose a serious health risk which affects millions of people. Stopping smoking will significantly reduce the risk of heart disease, lung disease, and many forms  of cancer. Recommended strategies for quitting are covered, including working with your doctor to develop a successful plan.  Culinary   Becoming a Set designer conducted group or individual video education with verbal and written material and guidebook.  Patient learns that cooking at home can be healthy, cost-effective, quick, and puts them in control. Keys to cooking healthy recipes will include looking at your recipe, assessing your equipment needs, planning ahead, making it simple, choosing cost-effective seasonal ingredients, and limiting the use of added fats, salts, and sugars.  Cooking - Breakfast and Snacks  Clinical staff conducted group or individual video education with verbal and written material and guidebook.  Patient learns how important breakfast is to satiety and nutrition through the entire day. Recommendations include key foods to eat during breakfast to help stabilize blood sugar levels and to prevent overeating at meals later in the day. Planning ahead is also a key component.  Cooking - Educational psychologist conducted group or individual video education with verbal and written material and guidebook.  Patient learns eating strategies to improve overall  health, including an approach to cook more at home. Recommendations include thinking of animal protein as a side on your plate rather than center stage and focusing instead on lower calorie dense options like vegetables, fruits, whole grains, and plant-based proteins, such as beans. Making sauces in large quantities to freeze for later and leaving the skin on your vegetables are also recommended to maximize your experience.  Cooking - Healthy Salads and Dressing Clinical staff conducted group or individual video education with verbal and written material and guidebook.  Patient learns that vegetables, fruits, whole grains, and legumes are the foundations of the Pritikin Eating Plan. Recommendations include how to incorporate each of these in flavorful and healthy salads, and how to create homemade salad dressings. Proper handling of ingredients is also covered. Cooking - Soups and [employer] - Soups and Desserts Clinical staff conducted group or individual video education with verbal and written material and guidebook.  Patient learns that Pritikin soups and desserts make for easy, nutritious, and delicious snacks and meal components that are low in sodium, fat, sugar, and calorie density, while high in vitamins, minerals, and filling fiber. Recommendations include simple and healthy ideas for soups and desserts.   Overview     The Pritikin Solution Program Overview Clinical staff conducted group or individual video education with verbal and written material and guidebook.  Patient learns that the results of the Pritikin Program have been documented in more than 100 articles published in peer-reviewed journals, and the benefits include reducing risk factors for (and, in some cases, even reversing) high cholesterol, high blood pressure, type 2 diabetes, obesity, and more! An overview of the three key pillars of the Pritikin Program will be covered: eating well, doing regular exercise, and having a  healthy mind-set.  WORKSHOPS  Exercise: Exercise Basics: Building Your Action Plan Clinical staff led group instruction and group discussion with PowerPoint presentation and patient guidebook. To enhance the learning environment the use of posters, models and videos may be added. At the conclusion of this workshop, patients will comprehend the difference between physical activity and exercise, as well as the benefits of incorporating both, into their routine. Patients will understand the FITT (Frequency, Intensity, Time, and Type) principle and how to use it to build an exercise action plan. In addition, safety concerns and other considerations for exercise and cardiac rehab will be addressed  by the presenter. The purpose of this lesson is to promote a comprehensive and effective weekly exercise routine in order to improve patients' overall level of fitness.   Managing Heart Disease: Your Path to a Healthier Heart Clinical staff led group instruction and group discussion with PowerPoint presentation and patient guidebook. To enhance the learning environment the use of posters, models and videos may be added.At the conclusion of this workshop, patients will understand the anatomy and physiology of the heart. Additionally, they will understand how Pritikin's three pillars impact the risk factors, the progression, and the management of heart disease.  The purpose of this lesson is to provide a high-level overview of the heart, heart disease, and how the Pritikin lifestyle positively impacts risk factors.  Exercise Biomechanics Clinical staff led group instruction and group discussion with PowerPoint presentation and patient guidebook. To enhance the learning environment the use of posters, models and videos may be added. Patients will learn how the structural parts of their bodies function and how these functions impact their daily activities, movement, and exercise. Patients will learn how to  promote a neutral spine, learn how to manage pain, and identify ways to improve their physical movement in order to promote healthy living. The purpose of this lesson is to expose patients to common physical limitations that impact physical activity. Participants will learn practical ways to adapt and manage aches and pains, and to minimize their effect on regular exercise. Patients will learn how to maintain good posture while sitting, walking, and lifting.  Balance Training and Fall Prevention  Clinical staff led group instruction and group discussion with PowerPoint presentation and patient guidebook. To enhance the learning environment the use of posters, models and videos may be added. At the conclusion of this workshop, patients will understand the importance of their sensorimotor skills (vision, proprioception, and the vestibular system) in maintaining their ability to balance as they age. Patients will apply a variety of balancing exercises that are appropriate for their current level of function. Patients will understand the common causes for poor balance, possible solutions to these problems, and ways to modify their physical environment in order to minimize their fall risk. The purpose of this lesson is to teach patients about the importance of maintaining balance as they age and ways to minimize their risk of falling.  WORKSHOPS   Nutrition:  Fueling a Ship broker led group instruction and group discussion with PowerPoint presentation and patient guidebook. To enhance the learning environment the use of posters, models and videos may be added. Patients will review the foundational principles of the [facility] Eating Plan and understand what constitutes a serving size in each of the food groups. Patients will also learn Pritikin-friendly foods that are better choices when away from home and review make-ahead meal and snack options. Calorie density will be reviewed and  applied to three nutrition priorities: weight maintenance, weight loss, and weight gain. The purpose of this lesson is to reinforce (in a group setting) the key concepts around what patients are recommended to eat and how to apply these guidelines when away from home by planning and selecting Pritikin-friendly options. Patients will understand how calorie density may be adjusted for different weight management goals.  Mindful Eating  Clinical staff led group instruction and group discussion with PowerPoint presentation and patient guidebook. To enhance the learning environment the use of posters, models and videos may be added. Patients will briefly review the concepts of the [facility]ikin Eating Plan and the importance  of low-calorie dense foods. The concept of mindful eating will be introduced as well as the importance of paying attention to internal hunger signals. Triggers for non-hunger eating and techniques for dealing with triggers will be explored. The purpose of this lesson is to provide patients with the opportunity to review the basic principles of the [facility]ikin Eating Plan, discuss the value of eating mindfully and how to measure internal cues of hunger and fullness using the Hunger Scale. Patients will also discuss reasons for non-hunger eating and learn strategies to use for controlling emotional eating.  Targeting Your Nutrition Priorities Clinical staff led group instruction and group discussion with PowerPoint presentation and patient guidebook. To enhance the learning environment the use of posters, models and videos may be added. Patients will learn how to determine their genetic susceptibility to disease by reviewing their family history. Patients will gain insight into the importance of diet as part of an overall healthy lifestyle in mitigating the impact of genetics and other environmental insults. The purpose of this lesson is to provide patients with the opportunity to assess their personal  nutrition priorities by looking at their family history, their own health history and current risk factors. Patients will also be able to discuss ways of prioritizing and modifying the Pritikin Eating Plan for their highest risk areas  Menu  Clinical staff led group instruction and group discussion with PowerPoint presentation and patient guidebook. To enhance the learning environment the use of posters, models and videos may be added. Using menus brought in from [employer], or printed from [employer], patients will apply the Pritikin dining out guidelines that were presented in the [employer] video. Patients will also be able to practice these guidelines in a variety of provided scenarios. The purpose of this lesson is to provide patients with the opportunity to practice hands-on learning of the Pritikin Dining Out guidelines with actual menus and practice scenarios.  Label Reading Clinical staff led group instruction and group discussion with PowerPoint presentation and patient guidebook. To enhance the learning environment the use of posters, models and videos may be added. Patients will review and discuss the Pritikin label reading guidelines presented in [employer]'s Label Reading Educational series video. Using fool labels brought in from local grocery stores and markets, patients will apply the label reading guidelines and determine if the packaged food meet the Pritikin guidelines. The purpose of this lesson is to provide patients with the opportunity to review, discuss, and practice hands-on learning of the Pritikin Label Reading guidelines with actual packaged food labels. Cooking School  [employer]'s [employer] are designed to teach patients ways to prepare quick, simple, and affordable recipes at home. The importance of nutrition's role in chronic disease risk reduction is reflected in its emphasis in the overall Pritikin program. By learning how to prepare  essential core Pritikin Eating Plan recipes, patients will increase control over what they eat; be able to customize the flavor of foods without the use of added salt, sugar, or fat; and improve the quality of the food they consume. By learning a set of core recipes which are easily assembled, quickly prepared, and affordable, patients are more likely to prepare more healthy foods at home. These workshops focus on convenient breakfasts, simple entres, side dishes, and desserts which can be prepared with minimal effort and are consistent with nutrition recommendations for cardiovascular risk reduction. Cooking [employer] are taught by a Armed forces logistics/support/administrative officer (RD) who has been trained by the  Pritikin Designer, industrial/product. The chef or RD has a clear understanding of the importance of minimizing - if not completely eliminating - added fat, sugar, and sodium in recipes. Throughout the series of Cooking School Workshop sessions, patients will learn about healthy ingredients and efficient methods of cooking to build confidence in their capability to prepare    Cooking School weekly topics:  Adding Flavor- Sodium-Free  Fast and Healthy Breakfasts  Powerhouse Plant-Based Proteins  Satisfying Salads and Dressings  Simple Sides and Sauces  International Cuisine-Spotlight on the [employer] Zones  Delicious Desserts  Savory Soups  [employer] - Meals in a Astronomer Appetizers and Snacks  Comforting Weekend Breakfasts  One-Pot Wonders   Fast Evening Meals  Landscape architect Your Pritikin Plate  WORKSHOPS   Healthy Mindset (Psychosocial):  Focused Goals, Sustainable Changes Clinical staff led group instruction and group discussion with PowerPoint presentation and patient guidebook. To enhance the learning environment the use of posters, models and videos may be added. Patients will be able to apply effective goal setting strategies to establish at least one personal goal, and  then take consistent, meaningful action toward that goal. They will learn to identify common barriers to achieving personal goals and develop strategies to overcome them. Patients will also gain an understanding of how our mind-set can impact our ability to achieve goals and the importance of cultivating a positive and growth-oriented mind-set. The purpose of this lesson is to provide patients with a deeper understanding of how to set and achieve personal goals, as well as the tools and strategies needed to overcome common obstacles which may arise along the way.  From Head to Heart: The Power of a Healthy Outlook  Clinical staff led group instruction and group discussion with PowerPoint presentation and patient guidebook. To enhance the learning environment the use of posters, models and videos may be added. Patients will be able to recognize and describe the impact of emotions and mood on physical health. They will discover the importance of self-care and explore self-care practices which may work for them. Patients will also learn how to utilize the 4 C's to cultivate a healthier outlook and better manage stress and challenges. The purpose of this lesson is to demonstrate to patients how a healthy outlook is an essential part of maintaining good health, especially as they continue their cardiac rehab journey.  Healthy Sleep for a Healthy Heart Clinical staff led group instruction and group discussion with PowerPoint presentation and patient guidebook. To enhance the learning environment the use of posters, models and videos may be added. At the conclusion of this workshop, patients will be able to demonstrate knowledge of the importance of sleep to overall health, well-being, and quality of life. They will understand the symptoms of, and treatments for, common sleep disorders. Patients will also be able to identify daytime and nighttime behaviors which impact sleep, and they will be able to apply these  tools to help manage sleep-related challenges. The purpose of this lesson is to provide patients with a general overview of sleep and outline the importance of quality sleep. Patients will learn about a few of the most common sleep disorders. Patients will also be introduced to the concept of "sleep hygiene," and discover ways to self-manage certain sleeping problems through simple daily behavior changes. Finally, the workshop will motivate patients by clarifying the links between quality sleep and their goals of heart-healthy living.   Recognizing and Reducing Stress Clinical staff led group instruction  and group discussion with PowerPoint presentation and patient guidebook. To enhance the learning environment the use of posters, models and videos may be added. At the conclusion of this workshop, patients will be able to understand the types of stress reactions, differentiate between acute and chronic stress, and recognize the impact that chronic stress has on their health. They will also be able to apply different coping mechanisms, such as reframing negative self-talk. Patients will have the opportunity to practice a variety of stress management techniques, such as deep abdominal breathing, progressive muscle relaxation, and/or guided imagery.  The purpose of this lesson is to educate patients on the role of stress in their lives and to provide healthy techniques for coping with it.  Learning Barriers/Preferences:  Learning Barriers/Preferences - 11/13/22 9562       Learning Barriers/Preferences   Learning Barriers None    Learning Preferences Group Instruction;Individual Instruction;Skilled Demonstration;Written Material;Pictoral             Education Topics:  Knowledge Questionnaire Score:  Knowledge Questionnaire Score - 11/13/22 0840       Knowledge Questionnaire Score   Pre Score 21/24             Core Components/Risk Factors/Patient Goals at Admission:  Personal Goals and  Risk Factors at Admission - 11/13/22 0821       Core Components/Risk Factors/Patient Goals on Admission    Weight Management Yes;Weight Maintenance    Intervention Weight Management: Develop a combined nutrition and exercise program designed to reach desired caloric intake, while maintaining appropriate intake of nutrient and fiber, sodium and fats, and appropriate energy expenditure required for the weight goal.;Weight Management: Provide education and appropriate resources to help participant work on and attain dietary goals.    Expected Outcomes Short Term: Continue to assess and modify interventions until short term weight is achieved;Weight Maintenance: Understanding of the daily nutrition guidelines, which includes 25-35% calories from fat, 7% or less cal from saturated fats, less than 200mg  cholesterol, less than 1.5gm of sodium, & 5 or more servings of fruits and vegetables daily;Understanding recommendations for meals to include 15-35% energy as protein, 25-35% energy from fat, 35-60% energy from carbohydrates, less than 200mg  of dietary cholesterol, 20-35 gm of total fiber daily;Understanding of distribution of calorie intake throughout the day with the consumption of 4-5 meals/snacks    Heart Failure Yes    Intervention Provide a combined exercise and nutrition program that is supplemented with education, support and counseling about heart failure. Directed toward relieving symptoms such as shortness of breath, decreased exercise tolerance, and extremity edema.    Expected Outcomes Improve functional capacity of life;Short term: Attendance in program 2-3 days a week with increased exercise capacity. Reported lower sodium intake. Reported increased fruit and vegetable intake. Reports medication compliance.;Short term: Daily weights obtained and reported for increase. Utilizing diuretic protocols set by physician.;Long term: Adoption of self-care skills and reduction of barriers for early signs and  symptoms recognition and intervention leading to self-care maintenance.    Hypertension Yes    Intervention Provide education on lifestyle modifcations including regular physical activity/exercise, weight management, moderate sodium restriction and increased consumption of fresh fruit, vegetables, and low fat dairy, alcohol moderation, and smoking cessation.;Monitor prescription use compliance.    Expected Outcomes Short Term: Continued assessment and intervention until BP is < 140/29mm HG in hypertensive participants. < 130/60mm HG in hypertensive participants with diabetes, heart failure or chronic kidney disease.;Long Term: Maintenance of blood pressure at goal levels.  Lipids Yes    Intervention Provide education and support for participant on nutrition & aerobic/resistive exercise along with prescribed medications to achieve LDL 70mg , HDL >40mg .    Expected Outcomes Short Term: Participant states understanding of desired cholesterol values and is compliant with medications prescribed. Participant is following exercise prescription and nutrition guidelines.;Long Term: Cholesterol controlled with medications as prescribed, with individualized exercise RX and with personalized nutrition plan. Value goals: LDL < 70mg , HDL > 40 mg.             Core Components/Risk Factors/Patient Goals Review:   Goals and Risk Factor Review     Row Name 11/21/22 1437 12/23/22 1640           Core Components/Risk Factors/Patient Goals Review   Personal Goals Review Weight Management/Obesity;Heart Failure;Hypertension;Lipids Weight Management/Obesity;Heart Failure;Hypertension;Lipids      Review Razia started cardiac rehab on 11/21/22 and did well with exercise. Vital signs were stable. Waver is doing well well with exercise at cardiac rehab. . Vital signs have been  stable.      Expected Outcomes Amiliana will continue to participate in cardiac rehab for exercise, nutrition and lifestyle modifications  Tameeka will continue to participate in cardiac rehab for exercise, nutrition and lifestyle modifications               Core Components/Risk Factors/Patient Goals at Discharge (Final Review):   Goals and Risk Factor Review - 12/23/22 1640       Core Components/Risk Factors/Patient Goals Review   Personal Goals Review Weight Management/Obesity;Heart Failure;Hypertension;Lipids    Review Cherril is doing well well with exercise at cardiac rehab. . Vital signs have been  stable.    Expected Outcomes Arlowe will continue to participate in cardiac rehab for exercise, nutrition and lifestyle modifications             ITP Comments:  ITP Comments     Row Name 11/13/22 0805 11/21/22 1433 12/23/22 1638       ITP Comments Armanda Magic, MD: Medical Director.  Introduction to the [facility] Education Program/Intensive Cardiac Rehab.  Initial orientation packet reviewed with the patient. 30 day ITP review. Shawnte started cardiac rehab on 11/21/22 and did well with exercise. 30 day ITP review. Anyea has good attendance and participaiton  with exercise at cardiac rehab.              Comments: See ITP comments.Thayer Headings RN BSN

## 2022-12-24 ENCOUNTER — Encounter (HOSPITAL_COMMUNITY)
Admission: RE | Admit: 2022-12-24 | Discharge: 2022-12-24 | Disposition: A | Discharge status: Home / Self Care | Payer: Medicare HMO | Source: Ambulatory Visit | Attending: Cardiology | Admitting: Cardiology

## 2022-12-24 DIAGNOSIS — I214 Non-ST elevation (NSTEMI) myocardial infarction: Secondary | ICD-10-CM

## 2022-12-25 DIAGNOSIS — E785 Hyperlipidemia, unspecified: Secondary | ICD-10-CM | POA: Diagnosis not present

## 2022-12-25 LAB — LIPID PANEL
Chol/HDL Ratio: 2.5 ratio (ref 0.0–4.4)
Cholesterol, Total: 149 mg/dL (ref 100–199)
HDL: 60 mg/dL (ref >39)
LDL Chol Calc (NIH): 76 mg/dL (ref 0–99)
Triglycerides: 65 mg/dL (ref 0–149)
VLDL Cholesterol Cal: 13 mg/dL (ref 5–40)

## 2022-12-25 LAB — HEPATIC FUNCTION PANEL
ALT: 44 IU/L — ABNORMAL HIGH (ref 0–32)
AST: 38 IU/L (ref 0–40)
Albumin: 4.4 g/dL (ref 3.7–4.7)
Alkaline Phosphatase: 47 IU/L (ref 44–121)
Bilirubin Total: 0.3 mg/dL (ref 0.0–1.2)
Bilirubin, Direct: 0.11 mg/dL (ref 0.00–0.40)
Total Protein: 7.1 g/dL (ref 6.0–8.5)

## 2022-12-26 ENCOUNTER — Encounter (HOSPITAL_COMMUNITY): Admission: RE | Admit: 2022-12-26 | Payer: Medicare HMO | Source: Ambulatory Visit

## 2022-12-29 DIAGNOSIS — R69 Illness, unspecified: Secondary | ICD-10-CM | POA: Diagnosis not present

## 2022-12-31 ENCOUNTER — Encounter (HOSPITAL_COMMUNITY)
Admission: RE | Admit: 2022-12-31 | Discharge: 2022-12-31 | Disposition: A | Discharge status: Home / Self Care | Payer: Medicare HMO | Source: Ambulatory Visit | Attending: Cardiology | Admitting: Cardiology

## 2022-12-31 DIAGNOSIS — I252 Old myocardial infarction: Secondary | ICD-10-CM | POA: Insufficient documentation

## 2022-12-31 DIAGNOSIS — Z48812 Encounter for surgical aftercare following surgery on the circulatory system: Secondary | ICD-10-CM | POA: Diagnosis not present

## 2022-12-31 DIAGNOSIS — I214 Non-ST elevation (NSTEMI) myocardial infarction: Secondary | ICD-10-CM | POA: Diagnosis present

## 2023-01-02 ENCOUNTER — Encounter (HOSPITAL_COMMUNITY): Payer: Medicare HMO

## 2023-01-05 DIAGNOSIS — R69 Illness, unspecified: Secondary | ICD-10-CM | POA: Diagnosis not present

## 2023-01-06 ENCOUNTER — Ambulatory Visit: Payer: Medicare HMO | Admitting: Podiatry

## 2023-01-06 DIAGNOSIS — L84 Corns and callosities: Secondary | ICD-10-CM

## 2023-01-06 DIAGNOSIS — B351 Tinea unguium: Secondary | ICD-10-CM | POA: Diagnosis not present

## 2023-01-06 DIAGNOSIS — M79676 Pain in unspecified toe(s): Secondary | ICD-10-CM | POA: Diagnosis not present

## 2023-01-06 DIAGNOSIS — M2041 Other hammer toe(s) (acquired), right foot: Secondary | ICD-10-CM

## 2023-01-06 DIAGNOSIS — M2042 Other hammer toe(s) (acquired), left foot: Secondary | ICD-10-CM

## 2023-01-06 DIAGNOSIS — Z7901 Long term (current) use of anticoagulants: Secondary | ICD-10-CM | POA: Diagnosis not present

## 2023-01-06 NOTE — Progress Notes (Signed)
Subjective: Chief Complaint  Patient presents with   Nail Problem    Routine nail care.  Tip of left second toe is bothersome as well.  No other concerns.      82 y.o. returns the office today for painful, elongated, thickened toenails which she is unable to trim herself.  States the nails get painful to get elongated.  She does have hammertoes which resulted in a painful callus on the left second toe causing quite a bit of pain.  No open lesions otherwise.  She is now on Plavix.   Objective: AAO 3, NAD DP/PT pulses palpable, CRT less than 3 seconds Nails hypertrophic, dystrophic, elongated, brittle, discolored 10.  Incurvation of the nails most on the right second toenail with localized edema.  There is no drainage or pus today.  There is tenderness overlying the nails 1-5 bilaterally.  There is no right second toe Nail continues to be causing some mild edema but there is no cellulitis.  There is no drainage or pus.  She does get incurvation present multiple other toenails as well. Minimal hyperkeratotic lesions of the distal aspect left second digit. No underlying ulceration, drainage or other signs of infection.  Hammertoes present No pain with calf compression, warmth, erythema.  Assessment: Symptomatic onychomycosis, ingrown toenails; hyperkeratotic lesion left foot due to hammertoe deformity  Plan: -Treatment options including alternatives, risks, complications were discussed -Nails sharply debrided 10 without any complications or bleeding.  After ingrown toenails plain signs of infection or continued discomfort. -Hyperkeratotic lesion sharply debrided 1 without complication/bleeding.  This is a courtesy as this is coming from hammertoe.  Discussed offloading.  The callus does cause quite a bit of discomfort, and she is on blood thinners so I do not want her to try to do this herself.  -Daily foot inspection.  Ovid Curd, DPM

## 2023-01-07 ENCOUNTER — Encounter (HOSPITAL_COMMUNITY)
Admission: RE | Admit: 2023-01-07 | Discharge: 2023-01-07 | Disposition: A | Discharge status: Home / Self Care | Payer: Medicare HMO | Source: Ambulatory Visit | Attending: Cardiology | Admitting: Cardiology

## 2023-01-07 DIAGNOSIS — I252 Old myocardial infarction: Secondary | ICD-10-CM | POA: Diagnosis not present

## 2023-01-07 DIAGNOSIS — I214 Non-ST elevation (NSTEMI) myocardial infarction: Secondary | ICD-10-CM

## 2023-01-09 ENCOUNTER — Encounter (HOSPITAL_COMMUNITY)
Admission: RE | Admit: 2023-01-09 | Discharge: 2023-01-09 | Disposition: A | Discharge status: Home / Self Care | Payer: Medicare HMO | Source: Ambulatory Visit | Attending: Cardiology | Admitting: Cardiology

## 2023-01-09 DIAGNOSIS — I214 Non-ST elevation (NSTEMI) myocardial infarction: Secondary | ICD-10-CM

## 2023-01-09 DIAGNOSIS — I252 Old myocardial infarction: Secondary | ICD-10-CM | POA: Diagnosis not present

## 2023-01-12 DIAGNOSIS — R69 Illness, unspecified: Secondary | ICD-10-CM | POA: Diagnosis not present

## 2023-01-13 NOTE — Progress Notes (Signed)
Reviewed home exercise Rx with patient today.  Encouraged warm-up, cool-down, and stretching. Reviewed THRR of 55-10 and keeping RPE between 11-13. Encouraged to hydrate with activity.  Reviewed weather parameters for temperature and humidity for safe exercise outdoors. Reviewed S/S to terminate exercise and when to call 911 vs MD.  Pt encouraged to always carry a cell phone for safety when exercising outdoors. Pt verbalized understanding of the home exercise Rx and was provided a copy.   Lorin Picket MS, ACSM-CEP, CCRP

## 2023-01-14 ENCOUNTER — Encounter (HOSPITAL_COMMUNITY)
Admission: RE | Admit: 2023-01-14 | Discharge: 2023-01-14 | Disposition: A | Discharge status: Home / Self Care | Payer: Medicare HMO | Source: Ambulatory Visit | Attending: Cardiology | Admitting: Cardiology

## 2023-01-14 DIAGNOSIS — I214 Non-ST elevation (NSTEMI) myocardial infarction: Secondary | ICD-10-CM

## 2023-01-14 DIAGNOSIS — I252 Old myocardial infarction: Secondary | ICD-10-CM | POA: Diagnosis not present

## 2023-01-16 ENCOUNTER — Encounter (HOSPITAL_COMMUNITY)
Admission: RE | Admit: 2023-01-16 | Discharge: 2023-01-16 | Disposition: A | Discharge status: Home / Self Care | Payer: Medicare HMO | Source: Ambulatory Visit | Attending: Cardiology | Admitting: Cardiology

## 2023-01-16 DIAGNOSIS — I252 Old myocardial infarction: Secondary | ICD-10-CM | POA: Diagnosis not present

## 2023-01-16 DIAGNOSIS — I214 Non-ST elevation (NSTEMI) myocardial infarction: Secondary | ICD-10-CM

## 2023-01-17 ENCOUNTER — Emergency Department (HOSPITAL_COMMUNITY): Payer: Medicare HMO

## 2023-01-17 ENCOUNTER — Emergency Department (HOSPITAL_COMMUNITY)
Admission: EM | Admit: 2023-01-17 | Discharge: 2023-01-18 | Disposition: A | Discharge status: Home / Self Care | Payer: Medicare HMO | Attending: Emergency Medicine | Admitting: Emergency Medicine

## 2023-01-17 ENCOUNTER — Other Ambulatory Visit: Payer: Self-pay

## 2023-01-17 DIAGNOSIS — Z79899 Other long term (current) drug therapy: Secondary | ICD-10-CM | POA: Insufficient documentation

## 2023-01-17 DIAGNOSIS — M25522 Pain in left elbow: Secondary | ICD-10-CM | POA: Diagnosis not present

## 2023-01-17 DIAGNOSIS — M19022 Primary osteoarthritis, left elbow: Secondary | ICD-10-CM | POA: Diagnosis not present

## 2023-01-17 DIAGNOSIS — S0083XA Contusion of other part of head, initial encounter: Secondary | ICD-10-CM | POA: Insufficient documentation

## 2023-01-17 DIAGNOSIS — W19XXXA Unspecified fall, initial encounter: Secondary | ICD-10-CM | POA: Diagnosis not present

## 2023-01-17 DIAGNOSIS — M5030 Other cervical disc degeneration, unspecified cervical region: Secondary | ICD-10-CM | POA: Diagnosis not present

## 2023-01-17 DIAGNOSIS — R102 Pelvic and perineal pain: Secondary | ICD-10-CM | POA: Diagnosis not present

## 2023-01-17 DIAGNOSIS — Z7982 Long term (current) use of aspirin: Secondary | ICD-10-CM | POA: Diagnosis not present

## 2023-01-17 DIAGNOSIS — S60222A Contusion of left hand, initial encounter: Secondary | ICD-10-CM | POA: Diagnosis not present

## 2023-01-17 DIAGNOSIS — S7002XA Contusion of left hip, initial encounter: Secondary | ICD-10-CM | POA: Diagnosis not present

## 2023-01-17 DIAGNOSIS — I4510 Unspecified right bundle-branch block: Secondary | ICD-10-CM | POA: Diagnosis not present

## 2023-01-17 DIAGNOSIS — M79642 Pain in left hand: Secondary | ICD-10-CM | POA: Diagnosis not present

## 2023-01-17 DIAGNOSIS — M799 Soft tissue disorder, unspecified: Secondary | ICD-10-CM | POA: Diagnosis not present

## 2023-01-17 DIAGNOSIS — I10 Essential (primary) hypertension: Secondary | ICD-10-CM | POA: Diagnosis not present

## 2023-01-17 DIAGNOSIS — W01198A Fall on same level from slipping, tripping and stumbling with subsequent striking against other object, initial encounter: Secondary | ICD-10-CM | POA: Insufficient documentation

## 2023-01-17 DIAGNOSIS — S0993XA Unspecified injury of face, initial encounter: Secondary | ICD-10-CM | POA: Diagnosis not present

## 2023-01-17 DIAGNOSIS — S5002XA Contusion of left elbow, initial encounter: Secondary | ICD-10-CM | POA: Diagnosis not present

## 2023-01-17 DIAGNOSIS — S0081XA Abrasion of other part of head, initial encounter: Secondary | ICD-10-CM

## 2023-01-17 DIAGNOSIS — Z743 Need for continuous supervision: Secondary | ICD-10-CM | POA: Diagnosis not present

## 2023-01-17 DIAGNOSIS — S50312A Abrasion of left elbow, initial encounter: Secondary | ICD-10-CM | POA: Diagnosis not present

## 2023-01-17 DIAGNOSIS — R079 Chest pain, unspecified: Secondary | ICD-10-CM | POA: Diagnosis not present

## 2023-01-17 DIAGNOSIS — M160 Bilateral primary osteoarthritis of hip: Secondary | ICD-10-CM | POA: Diagnosis not present

## 2023-01-17 DIAGNOSIS — S0012XA Contusion of left eyelid and periocular area, initial encounter: Secondary | ICD-10-CM | POA: Insufficient documentation

## 2023-01-17 DIAGNOSIS — S8002XA Contusion of left knee, initial encounter: Secondary | ICD-10-CM | POA: Diagnosis not present

## 2023-01-17 DIAGNOSIS — M1812 Unilateral primary osteoarthritis of first carpometacarpal joint, left hand: Secondary | ICD-10-CM | POA: Diagnosis not present

## 2023-01-17 DIAGNOSIS — S8001XA Contusion of right knee, initial encounter: Secondary | ICD-10-CM | POA: Insufficient documentation

## 2023-01-17 DIAGNOSIS — I672 Cerebral atherosclerosis: Secondary | ICD-10-CM | POA: Diagnosis not present

## 2023-01-17 DIAGNOSIS — R58 Hemorrhage, not elsewhere classified: Secondary | ICD-10-CM | POA: Diagnosis not present

## 2023-01-17 LAB — COMPREHENSIVE METABOLIC PANEL
ALT: 32 U/L (ref 0–44)
AST: 28 U/L (ref 15–41)
Albumin: 3.6 g/dL (ref 3.5–5.0)
Alkaline Phosphatase: 40 U/L (ref 38–126)
Anion gap: 12 (ref 5–15)
BUN: 11 mg/dL (ref 8–23)
CO2: 19 mmol/L — ABNORMAL LOW (ref 22–32)
Calcium: 9 mg/dL (ref 8.9–10.3)
Chloride: 104 mmol/L (ref 98–111)
Creatinine, Ser: 0.77 mg/dL (ref 0.44–1.00)
GFR, Estimated: >60 mL/min (ref >60)
Glucose, Bld: 111 mg/dL — ABNORMAL HIGH (ref 70–99)
Potassium: 3.7 mmol/L (ref 3.5–5.1)
Sodium: 135 mmol/L (ref 135–145)
Total Bilirubin: 0.6 mg/dL (ref 0.3–1.2)
Total Protein: 6.5 g/dL (ref 6.5–8.1)

## 2023-01-17 LAB — I-STAT CHEM 8, ED
BUN: 12 mg/dL (ref 8–23)
Calcium, Ion: 1.12 mmol/L — ABNORMAL LOW (ref 1.15–1.40)
Chloride: 103 mmol/L (ref 98–111)
Creatinine, Ser: 0.7 mg/dL (ref 0.44–1.00)
Glucose, Bld: 107 mg/dL — ABNORMAL HIGH (ref 70–99)
HCT: 38 % (ref 36.0–46.0)
Hemoglobin: 12.9 g/dL (ref 12.0–15.0)
Potassium: 3.8 mmol/L (ref 3.5–5.1)
Sodium: 137 mmol/L (ref 135–145)
TCO2: 22 mmol/L (ref 22–32)

## 2023-01-17 LAB — SAMPLE TO BLOOD BANK

## 2023-01-17 LAB — CBC
HCT: 38 % (ref 36.0–46.0)
Hemoglobin: 12.9 g/dL (ref 12.0–15.0)
MCH: 30.4 pg (ref 26.0–34.0)
MCHC: 33.9 g/dL (ref 30.0–36.0)
MCV: 89.6 fL (ref 80.0–100.0)
Platelets: 218 10*3/uL (ref 150–400)
RBC: 4.24 MIL/uL (ref 3.87–5.11)
RDW: 12.7 % (ref 11.5–15.5)
WBC: 10.9 10*3/uL — ABNORMAL HIGH (ref 4.0–10.5)
nRBC: 0 % (ref 0.0–0.2)

## 2023-01-17 LAB — PROTIME-INR
INR: 1 (ref 0.8–1.2)
Prothrombin Time: 13.4 s (ref 11.4–15.2)

## 2023-01-17 LAB — ETHANOL: Alcohol, Ethyl (B): <10 mg/dL (ref <10)

## 2023-01-17 LAB — I-STAT CG4 LACTIC ACID, ED: Lactic Acid, Venous: 1.2 mmol/L (ref 0.5–1.9)

## 2023-01-17 MED ORDER — MORPHINE SULFATE (PF) 2 MG/ML IV SOLN
2.0000 mg | Freq: Once | INTRAVENOUS | Status: AC
  Administered 2023-01-17: 2 mg via INTRAVENOUS
  Filled 2023-01-17: qty 1

## 2023-01-17 MED ORDER — ONDANSETRON HCL 4 MG/2ML IJ SOLN
4.0000 mg | Freq: Once | INTRAMUSCULAR | Status: AC
  Administered 2023-01-17: 4 mg via INTRAVENOUS
  Filled 2023-01-17: qty 2

## 2023-01-17 NOTE — ED Triage Notes (Signed)
Pt BIBGEMS after having a fall while she was carrying stew over to the neighbors house. She hit her head on the concrete after tripping on the down spout for the water drainage. Pt is alert and oriented speaking in full sentences. Pt did not get to take her second dose of blood pressure meds.  Plavix

## 2023-01-17 NOTE — ED Notes (Signed)
Pt ambulatory to RR w no assistance from staff; request for pain medication conveyed to EDP, will medicate on return to room

## 2023-01-17 NOTE — ED Notes (Signed)
Patient transported to CT 

## 2023-01-17 NOTE — ED Provider Notes (Incomplete)
EMERGENCY DEPARTMENT AT [facility] Provider Note   CSN: [id number] Arrival date & time: 01/17/23  1824     History {Add pertinent medical, surgical, social history, OB history to HPI:1} Chief Complaint  Patient presents with  . Fall    Beth Simon is a 82 y.o. female.  Patient reports that she was taking soup to her neighbor and tripped on a gutter.  Patient reports she struck her head.  Patient reports that she hit her elbow and her hand on the left side.  Patient reports that she did not lose consciousness.  Patient is on Plavix.  Patient denies any headache.  Patient has difficulty seeing out of her left eye due to the swelling around her eye.  Patient denies any chest pain she denies any abdominal pain.  Patient reports that she did not pass out.  Patient reports that she tripped causing the fall.  The history is provided by the patient. No language interpreter was used.  Fall This is a new problem. The current episode started less than 1 hour ago.       Home Medications Prior to Admission medications   Medication Sig Start Date End Date Taking? Authorizing Provider  aspirin EC (ASPIRIN LOW DOSE) 81 MG tablet TAKE 1 TABLET (81 MG TOTAL) BY MOUTH DAILY. SWALLOW WHOLE. 11/28/22   [place], Peter M, MD  atorvastatin (LIPITOR) 40 MG tablet TAKE 1 TABLET BY MOUTH EVERY DAY 11/28/22   [place], Peter M, MD  carvedilol (COREG) 6.25 MG tablet Take 1 tablet (6.25 mg total) by mouth 2 (two) times daily. 11/04/22   Corrin Parker, PA-C  clopidogrel (PLAVIX) 75 MG tablet TAKE 1 TABLET BY MOUTH DAILY WITH BREAKFAST. 11/28/22   [place], Peter M, MD  Cyanocobalamin (VITAMIN B 12 PO) Take 1 tablet by mouth every morning.     [provider]  dapagliflozin propanediol (FARXIGA) 10 MG TABS tablet Take 1 tablet (10 mg total) by mouth daily before breakfast. 12/12/22   Corrin Parker, PA-C  denosumab (PROLIA) 60 MG/ML SOLN injection Inject 60 mg into the skin every  6 (six) months. Administer in upper arm, thigh, or abdomen    [provider]  Flaxseed, Linseed, (FLAX SEEDS PO) Take 1 scoop by mouth every morning. Ground flaxseed in cereal.    [provider]  hydrALAZINE (APRESOLINE) 25 MG tablet Take 1 tablet (25 mg total) by mouth as needed (for blood pressure greater than 160/100. May take up to twice daily.). 11/04/22 02/02/23  Marjie Skiff E, PA-C  losartan (COZAAR) 25 MG tablet Take 1 tablet (25 mg total) by mouth daily. 12/03/22   Marjie Skiff E, PA-C  Multiple Vitamins-Minerals (PRESERVISION AREDS) CAPS Take 1 capsule by mouth daily.    [provider]  OVER THE COUNTER MEDICATION Take 1 tablet by mouth every morning. Astazanthin. (anti-oxidant)    [provider]  VITAMIN D, CHOLECALCIFEROL, PO Take 4,000 Int'l Units by mouth every morning.     [provider]      Allergies    Celecoxib, Kenalog [triamcinolone], Prednisone, and Procaine    Review of Systems   Review of Systems  Eyes:  Negative for pain.  Musculoskeletal:  Positive for joint swelling.  Skin:  Positive for wound.  All other systems reviewed and are negative.   Physical Exam Updated Vital Signs BP (!) 149/69   Pulse 66   Temp 97.8 F (36.6 C)   Resp 11   Ht  4' 10.5" (1.486 m)   Wt 56.7 kg   LMP 03/31/1988 (Approximate)   SpO2 99%   BMI 25.68 kg/m  Physical Exam Vitals and nursing note reviewed.  Constitutional:      Appearance: She is well-developed.  HENT:     Head: Normocephalic.     Right Ear: External ear normal.     Left Ear: External ear normal.     Nose: Nose normal.     Mouth/Throat:     Mouth: Mucous membranes are moist.  Eyes:     Extraocular Movements: Extraocular movements intact.     Pupils: Pupils are equal, round, and reactive to light.     Comments: Left eye swollen shut, large hematoma, superficial laceration forehead.  Cardiovascular:     Rate and Rhythm: Normal rate.  Pulmonary:      Effort: Pulmonary effort is normal.  Abdominal:     General: There is no distension.  Musculoskeletal:        General: Normal range of motion.     Cervical back: Normal range of motion.  Skin:    Comments: Abrasions left elbow, abrasions left hand, bruising left hand.  Bruising bilateral knees, bruising left hip, full range of motion hips and knees.  Neurological:     General: No focal deficit present.     Mental Status: She is alert and oriented to person, place, and time.  Psychiatric:        Mood and Affect: Mood normal.     ED Results / Procedures / Treatments   Labs (all labs ordered are listed, but only abnormal results are displayed) Labs Reviewed  COMPREHENSIVE METABOLIC PANEL - Abnormal; Notable for the following components:      Result Value   CO2 19 (*)    Glucose, Bld 111 (*)    All other components within normal limits  CBC - Abnormal; Notable for the following components:   WBC 10.9 (*)    All other components within normal limits  I-STAT CHEM 8, ED - Abnormal; Notable for the following components:   Glucose, Bld 107 (*)    Calcium, Ion 1.12 (*)    All other components within normal limits  ETHANOL  PROTIME-INR  URINALYSIS, ROUTINE W REFLEX MICROSCOPIC  I-STAT CG4 LACTIC ACID, ED  SAMPLE TO BLOOD BANK    EKG None  Radiology CT HEAD WO CONTRAST  Result Date: 01/17/2023 CLINICAL DATA:  Fall, on blood thinners, facial trauma EXAM: CT HEAD WITHOUT CONTRAST CT MAXILLOFACIAL WITHOUT CONTRAST CT CERVICAL SPINE WITHOUT CONTRAST TECHNIQUE: Multidetector CT imaging of the head, cervical spine, and maxillofacial structures were performed using the standard protocol without intravenous contrast. Multiplanar CT image reconstructions of the cervical spine and maxillofacial structures were also generated. RADIATION DOSE REDUCTION: This exam was performed according to the departmental dose-optimization program which includes automated exposure control, adjustment of the mA  and/or kV according to patient size and/or use of iterative reconstruction technique. COMPARISON:  None Available. FINDINGS: CT HEAD FINDINGS Brain: No evidence of acute infarct, hemorrhage, mass, mass effect, or midline shift. No hydrocephalus or extra-axial fluid collection. Vascular: No hyperdense vessel. Atherosclerotic calcifications in the intracranial carotid and vertebral arteries. Skull: Negative for fracture or focal lesion. CT MAXILLOFACIAL FINDINGS Osseous: No fracture or mandibular dislocation. No destructive process. Orbits: No traumatic or inflammatory finding within the orbits. Status post bilateral lens replacements. Sinuses: Mild mucosal thickening in the ethmoid air cells. Soft tissues: Large left frontal scalp and periorbital hematoma with laceration. CT  CERVICAL SPINE FINDINGS Alignment: No traumatic listhesis. Trace anterolisthesis of C5 on C6, C6 on C7, and C7 on T1, which appears facet mediated. Levocurvature of the cervical spine Skull base and vertebrae: No acute fracture. No primary bone lesion or focal pathologic process. Soft tissues and spinal canal: No prevertebral fluid or swelling. No visible canal hematoma. Disc levels: Degenerative changes in the cervical spine. No high-grade spinal canal stenosis. Upper chest: Negative. IMPRESSION: 1. No acute intracranial abnormality. 2. Large left frontal scalp and periorbital hematoma with laceration. No facial bone fracture. 3. No acute fracture or traumatic listhesis of the cervical spine. Electronically Signed   By: Wiliam Ke M.D.   On: 01/17/2023 21:23   CT CERVICAL SPINE WO CONTRAST  Result Date: 01/17/2023 CLINICAL DATA:  Fall, on blood thinners, facial trauma EXAM: CT HEAD WITHOUT CONTRAST CT MAXILLOFACIAL WITHOUT CONTRAST CT CERVICAL SPINE WITHOUT CONTRAST TECHNIQUE: Multidetector CT imaging of the head, cervical spine, and maxillofacial structures were performed using the standard protocol without intravenous contrast.  Multiplanar CT image reconstructions of the cervical spine and maxillofacial structures were also generated. RADIATION DOSE REDUCTION: This exam was performed according to the departmental dose-optimization program which includes automated exposure control, adjustment of the mA and/or kV according to patient size and/or use of iterative reconstruction technique. COMPARISON:  None Available. FINDINGS: CT HEAD FINDINGS Brain: No evidence of acute infarct, hemorrhage, mass, mass effect, or midline shift. No hydrocephalus or extra-axial fluid collection. Vascular: No hyperdense vessel. Atherosclerotic calcifications in the intracranial carotid and vertebral arteries. Skull: Negative for fracture or focal lesion. CT MAXILLOFACIAL FINDINGS Osseous: No fracture or mandibular dislocation. No destructive process. Orbits: No traumatic or inflammatory finding within the orbits. Status post bilateral lens replacements. Sinuses: Mild mucosal thickening in the ethmoid air cells. Soft tissues: Large left frontal scalp and periorbital hematoma with laceration. CT CERVICAL SPINE FINDINGS Alignment: No traumatic listhesis. Trace anterolisthesis of C5 on C6, C6 on C7, and C7 on T1, which appears facet mediated. Levocurvature of the cervical spine Skull base and vertebrae: No acute fracture. No primary bone lesion or focal pathologic process. Soft tissues and spinal canal: No prevertebral fluid or swelling. No visible canal hematoma. Disc levels: Degenerative changes in the cervical spine. No high-grade spinal canal stenosis. Upper chest: Negative. IMPRESSION: 1. No acute intracranial abnormality. 2. Large left frontal scalp and periorbital hematoma with laceration. No facial bone fracture. 3. No acute fracture or traumatic listhesis of the cervical spine. Electronically Signed   By: Wiliam Ke M.D.   On: 01/17/2023 21:23   CT MAXILLOFACIAL WO CONTRAST  Result Date: 01/17/2023 CLINICAL DATA:  Fall, on blood thinners, facial  trauma EXAM: CT HEAD WITHOUT CONTRAST CT MAXILLOFACIAL WITHOUT CONTRAST CT CERVICAL SPINE WITHOUT CONTRAST TECHNIQUE: Multidetector CT imaging of the head, cervical spine, and maxillofacial structures were performed using the standard protocol without intravenous contrast. Multiplanar CT image reconstructions of the cervical spine and maxillofacial structures were also generated. RADIATION DOSE REDUCTION: This exam was performed according to the departmental dose-optimization program which includes automated exposure control, adjustment of the mA and/or kV according to patient size and/or use of iterative reconstruction technique. COMPARISON:  None Available. FINDINGS: CT HEAD FINDINGS Brain: No evidence of acute infarct, hemorrhage, mass, mass effect, or midline shift. No hydrocephalus or extra-axial fluid collection. Vascular: No hyperdense vessel. Atherosclerotic calcifications in the intracranial carotid and vertebral arteries. Skull: Negative for fracture or focal lesion. CT MAXILLOFACIAL FINDINGS Osseous: No fracture or mandibular dislocation. No destructive process. Orbits: No  traumatic or inflammatory finding within the orbits. Status post bilateral lens replacements. Sinuses: Mild mucosal thickening in the ethmoid air cells. Soft tissues: Large left frontal scalp and periorbital hematoma with laceration. CT CERVICAL SPINE FINDINGS Alignment: No traumatic listhesis. Trace anterolisthesis of C5 on C6, C6 on C7, and C7 on T1, which appears facet mediated. Levocurvature of the cervical spine Skull base and vertebrae: No acute fracture. No primary bone lesion or focal pathologic process. Soft tissues and spinal canal: No prevertebral fluid or swelling. No visible canal hematoma. Disc levels: Degenerative changes in the cervical spine. No high-grade spinal canal stenosis. Upper chest: Negative. IMPRESSION: 1. No acute intracranial abnormality. 2. Large left frontal scalp and periorbital hematoma with laceration.  No facial bone fracture. 3. No acute fracture or traumatic listhesis of the cervical spine. Electronically Signed   By: Wiliam Ke M.D.   On: 01/17/2023 21:23   DG Elbow 2 Views Left  Result Date: 01/17/2023 CLINICAL DATA:  Recent fall with left elbow pain, initial encounter EXAM: LEFT ELBOW - 2 VIEW COMPARISON:  None Available. FINDINGS: Mild osteophytic changes are noted along the radial head. No joint effusion is seen. No acute fracture or dislocation is noted. IMPRESSION: Mild degenerative change without acute abnormality. Electronically Signed   By: Alcide Clever M.D.   On: 01/17/2023 21:12   DG Pelvis Portable  Result Date: 01/17/2023 CLINICAL DATA:  Recent fall with pelvic pain, initial encounter EXAM: PORTABLE PELVIS 1 VIEWS COMPARISON:  None Available. FINDINGS: Pelvic ring appears intact. Mild degenerative changes of the hip joints are seen. Soft tissue prominence is noted laterally on the left adjacent to the left hip likely related to a subcutaneous hematoma. No other bony abnormality is noted. IMPRESSION: No acute bony abnormality noted. Mild soft tissue swelling is noted over the left hip. Electronically Signed   By: Alcide Clever M.D.   On: 01/17/2023 21:04   DG Chest Port 1 View  Result Date: 01/17/2023 CLINICAL DATA:  Recent fall with chest pain, initial encounter EXAM: PORTABLE CHEST 1 VIEW COMPARISON:  11/02/22 FINDINGS: Cardiac shadow is within normal limits. The lungs are well aerated bilaterally. No focal infiltrate or sizable effusion is seen. Postsurgical changes in the left axilla are again noted. No bony abnormality is seen. IMPRESSION: No acute abnormality noted. Electronically Signed   By: Alcide Clever M.D.   On: 01/17/2023 21:00    Procedures Procedures  {Document cardiac monitor, telemetry assessment procedure when appropriate:1}  Medications Ordered in ED Medications  morphine (PF) 2 MG/ML injection 2 mg (2 mg Intravenous Given 01/17/23 1948)  ondansetron  (ZOFRAN) injection 4 mg (4 mg Intravenous Given 01/17/23 1947)    ED Course/ Medical Decision Making/ A&P   {   Click here for ABCD2, HEART and other calculatorsREFRESH Note before signing :1}                              Medical Decision Making Patient complains of tripping over a drain and hitting her head left elbow left hand and left hip.  Patient is on Plavix she did not lose consciousness  Amount and/or Complexity of Data Reviewed Independent Historian: EMS    Details: Brought in by EMS.  They report patient was awake and alert Labs: ordered. Decision-making details documented in ED Course.    Details: Labs ordered reviewed and interpreted.  Glucose is 111.  Lactic acid is normal Radiology: ordered and independent interpretation performed.  Decision-making details documented in ED Course.    Details: X-rays ordered reviewed and interpreted.  CT head CT cervical spine and CT maxillofacial showed no acute findings.  Risk Prescription drug management.    ***  {Document critical care time when appropriate:1} {Document review of labs and clinical decision tools ie heart score, Chads2Vasc2 etc:1}  {Document your independent review of radiology images, and any outside records:1} {Document your discussion with family members, caretakers, and with consultants:1} {Document social determinants of health affecting pt's care:1} {Document your decision making why or why not admission, treatments were needed:1} Final Clinical Impression(s) / ED Diagnoses Final diagnoses:  Fall, initial encounter  Contusion of face, initial encounter  Abrasion of face, initial encounter  Abrasion of left elbow, initial encounter  Contusion of left hand, initial encounter    Rx / DC Orders ED Discharge Orders     None

## 2023-01-17 NOTE — Discharge Instructions (Signed)
Ice to areas of swelling.  Tylenol every 4 hours for discomfort.  Return if any problems.

## 2023-01-17 NOTE — ED Provider Notes (Signed)
[place] EMERGENCY DEPARTMENT AT [facility] Provider Note   CSN: [id number] Arrival date & time: 01/17/23  1824     History  Chief Complaint  Patient presents with   Beth Simon is a 82 y.o. female.  Patient reports that she was taking soup to her neighbor and tripped on a gutter.  Patient reports she struck her head.  Patient reports that she hit her elbow and her hand on the left side.  Patient reports that she did not lose consciousness.  Patient is on Plavix.  Patient denies any headache.  Patient has difficulty seeing out of her left eye due to the swelling around her eye.  Patient denies any chest pain she denies any abdominal pain.  Patient reports that she did not pass out.  Patient reports that she tripped causing the fall.  The history is provided by the patient. No language interpreter was used.  Fall This is a new problem. The current episode started less than 1 hour ago.       Home Medications Prior to Admission medications   Medication Sig Start Date End Date Taking? Authorizing Provider  aspirin EC (ASPIRIN LOW DOSE) 81 MG tablet TAKE 1 TABLET (81 MG TOTAL) BY MOUTH DAILY. SWALLOW WHOLE. 11/28/22   [place], Peter M, MD  atorvastatin (LIPITOR) 40 MG tablet TAKE 1 TABLET BY MOUTH EVERY DAY 11/28/22   [place], Peter M, MD  carvedilol (COREG) 6.25 MG tablet Take 1 tablet (6.25 mg total) by mouth 2 (two) times daily. 11/04/22   Corrin Parker, PA-C  clopidogrel (PLAVIX) 75 MG tablet TAKE 1 TABLET BY MOUTH DAILY WITH BREAKFAST. 11/28/22   [place], Peter M, MD  Cyanocobalamin (VITAMIN B 12 PO) Take 1 tablet by mouth every morning.     [provider]  dapagliflozin propanediol (FARXIGA) 10 MG TABS tablet Take 1 tablet (10 mg total) by mouth daily before breakfast. 12/12/22   Corrin Parker, PA-C  denosumab (PROLIA) 60 MG/ML SOLN injection Inject 60 mg into the skin every 6 (six) months. Administer in upper arm, thigh, or abdomen     [provider]  Flaxseed, Linseed, (FLAX SEEDS PO) Take 1 scoop by mouth every morning. Ground flaxseed in cereal.    [provider]  hydrALAZINE (APRESOLINE) 25 MG tablet Take 1 tablet (25 mg total) by mouth as needed (for blood pressure greater than 160/100. May take up to twice daily.). 11/04/22 02/02/23  Marjie Skiff E, PA-C  losartan (COZAAR) 25 MG tablet Take 1 tablet (25 mg total) by mouth daily. 12/03/22   Marjie Skiff E, PA-C  Multiple Vitamins-Minerals (PRESERVISION AREDS) CAPS Take 1 capsule by mouth daily.    [provider]  OVER THE COUNTER MEDICATION Take 1 tablet by mouth every morning. Astazanthin. (anti-oxidant)    [provider]  VITAMIN D, CHOLECALCIFEROL, PO Take 4,000 Int'l Units by mouth every morning.     [provider]      Allergies    Celecoxib, Kenalog [triamcinolone], Prednisone, and Procaine    Review of Systems   Review of Systems  Eyes:  Negative for pain.  Musculoskeletal:  Positive for joint swelling.  Skin:  Positive for wound.  All other systems reviewed and are negative.   Physical Exam Updated Vital Signs BP (!) 149/69   Pulse 66   Temp 97.8 F (36.6 C)   Resp 11   Ht 4' 10.5" (1.486 m)   Wt 56.7 kg  LMP 03/31/1988 (Approximate)   SpO2 99%   BMI 25.68 kg/m  Physical Exam Vitals and nursing note reviewed.  Constitutional:      Appearance: She is well-developed.  HENT:     Head: Normocephalic.     Right Ear: External ear normal.     Left Ear: External ear normal.     Nose: Nose normal.     Mouth/Throat:     Mouth: Mucous membranes are moist.  Eyes:     Extraocular Movements: Extraocular movements intact.     Pupils: Pupils are equal, round, and reactive to light.     Comments: Left eye swollen shut, large hematoma, superficial laceration forehead.  Cardiovascular:     Rate and Rhythm: Normal rate.  Pulmonary:     Effort: Pulmonary effort is normal.  Abdominal:     General:  There is no distension.  Musculoskeletal:        General: Normal range of motion.     Cervical back: Normal range of motion.  Skin:    Comments: Abrasions left elbow, abrasions left hand, bruising left hand.  Bruising bilateral knees, bruising left hip, full range of motion hips and knees.  Neurological:     General: No focal deficit present.     Mental Status: She is alert and oriented to person, place, and time.  Psychiatric:        Mood and Affect: Mood normal.     ED Results / Procedures / Treatments   Labs (all labs ordered are listed, but only abnormal results are displayed) Labs Reviewed  COMPREHENSIVE METABOLIC PANEL - Abnormal; Notable for the following components:      Result Value   CO2 19 (*)    Glucose, Bld 111 (*)    All other components within normal limits  CBC - Abnormal; Notable for the following components:   WBC 10.9 (*)    All other components within normal limits  I-STAT CHEM 8, ED - Abnormal; Notable for the following components:   Glucose, Bld 107 (*)    Calcium, Ion 1.12 (*)    All other components within normal limits  ETHANOL  PROTIME-INR  URINALYSIS, ROUTINE W REFLEX MICROSCOPIC  I-STAT CG4 LACTIC ACID, ED  SAMPLE TO BLOOD BANK    EKG None  Radiology CT HEAD WO CONTRAST  Result Date: 01/17/2023 CLINICAL DATA:  Fall, on blood thinners, facial trauma EXAM: CT HEAD WITHOUT CONTRAST CT MAXILLOFACIAL WITHOUT CONTRAST CT CERVICAL SPINE WITHOUT CONTRAST TECHNIQUE: Multidetector CT imaging of the head, cervical spine, and maxillofacial structures were performed using the standard protocol without intravenous contrast. Multiplanar CT image reconstructions of the cervical spine and maxillofacial structures were also generated. RADIATION DOSE REDUCTION: This exam was performed according to the departmental dose-optimization program which includes automated exposure control, adjustment of the mA and/or kV according to patient size and/or use of iterative  reconstruction technique. COMPARISON:  None Available. FINDINGS: CT HEAD FINDINGS Brain: No evidence of acute infarct, hemorrhage, mass, mass effect, or midline shift. No hydrocephalus or extra-axial fluid collection. Vascular: No hyperdense vessel. Atherosclerotic calcifications in the intracranial carotid and vertebral arteries. Skull: Negative for fracture or focal lesion. CT MAXILLOFACIAL FINDINGS Osseous: No fracture or mandibular dislocation. No destructive process. Orbits: No traumatic or inflammatory finding within the orbits. Status post bilateral lens replacements. Sinuses: Mild mucosal thickening in the ethmoid air cells. Soft tissues: Large left frontal scalp and periorbital hematoma with laceration. CT CERVICAL SPINE FINDINGS Alignment: No traumatic listhesis. Trace anterolisthesis of C5  on C6, C6 on C7, and C7 on T1, which appears facet mediated. Levocurvature of the cervical spine Skull base and vertebrae: No acute fracture. No primary bone lesion or focal pathologic process. Soft tissues and spinal canal: No prevertebral fluid or swelling. No visible canal hematoma. Disc levels: Degenerative changes in the cervical spine. No high-grade spinal canal stenosis. Upper chest: Negative. IMPRESSION: 1. No acute intracranial abnormality. 2. Large left frontal scalp and periorbital hematoma with laceration. No facial bone fracture. 3. No acute fracture or traumatic listhesis of the cervical spine. Electronically Signed   By: Wiliam Ke M.D.   On: 01/17/2023 21:23   CT CERVICAL SPINE WO CONTRAST  Result Date: 01/17/2023 CLINICAL DATA:  Fall, on blood thinners, facial trauma EXAM: CT HEAD WITHOUT CONTRAST CT MAXILLOFACIAL WITHOUT CONTRAST CT CERVICAL SPINE WITHOUT CONTRAST TECHNIQUE: Multidetector CT imaging of the head, cervical spine, and maxillofacial structures were performed using the standard protocol without intravenous contrast. Multiplanar CT image reconstructions of the cervical spine and  maxillofacial structures were also generated. RADIATION DOSE REDUCTION: This exam was performed according to the departmental dose-optimization program which includes automated exposure control, adjustment of the mA and/or kV according to patient size and/or use of iterative reconstruction technique. COMPARISON:  None Available. FINDINGS: CT HEAD FINDINGS Brain: No evidence of acute infarct, hemorrhage, mass, mass effect, or midline shift. No hydrocephalus or extra-axial fluid collection. Vascular: No hyperdense vessel. Atherosclerotic calcifications in the intracranial carotid and vertebral arteries. Skull: Negative for fracture or focal lesion. CT MAXILLOFACIAL FINDINGS Osseous: No fracture or mandibular dislocation. No destructive process. Orbits: No traumatic or inflammatory finding within the orbits. Status post bilateral lens replacements. Sinuses: Mild mucosal thickening in the ethmoid air cells. Soft tissues: Large left frontal scalp and periorbital hematoma with laceration. CT CERVICAL SPINE FINDINGS Alignment: No traumatic listhesis. Trace anterolisthesis of C5 on C6, C6 on C7, and C7 on T1, which appears facet mediated. Levocurvature of the cervical spine Skull base and vertebrae: No acute fracture. No primary bone lesion or focal pathologic process. Soft tissues and spinal canal: No prevertebral fluid or swelling. No visible canal hematoma. Disc levels: Degenerative changes in the cervical spine. No high-grade spinal canal stenosis. Upper chest: Negative. IMPRESSION: 1. No acute intracranial abnormality. 2. Large left frontal scalp and periorbital hematoma with laceration. No facial bone fracture. 3. No acute fracture or traumatic listhesis of the cervical spine. Electronically Signed   By: Wiliam Ke M.D.   On: 01/17/2023 21:23   CT MAXILLOFACIAL WO CONTRAST  Result Date: 01/17/2023 CLINICAL DATA:  Fall, on blood thinners, facial trauma EXAM: CT HEAD WITHOUT CONTRAST CT MAXILLOFACIAL WITHOUT  CONTRAST CT CERVICAL SPINE WITHOUT CONTRAST TECHNIQUE: Multidetector CT imaging of the head, cervical spine, and maxillofacial structures were performed using the standard protocol without intravenous contrast. Multiplanar CT image reconstructions of the cervical spine and maxillofacial structures were also generated. RADIATION DOSE REDUCTION: This exam was performed according to the departmental dose-optimization program which includes automated exposure control, adjustment of the mA and/or kV according to patient size and/or use of iterative reconstruction technique. COMPARISON:  None Available. FINDINGS: CT HEAD FINDINGS Brain: No evidence of acute infarct, hemorrhage, mass, mass effect, or midline shift. No hydrocephalus or extra-axial fluid collection. Vascular: No hyperdense vessel. Atherosclerotic calcifications in the intracranial carotid and vertebral arteries. Skull: Negative for fracture or focal lesion. CT MAXILLOFACIAL FINDINGS Osseous: No fracture or mandibular dislocation. No destructive process. Orbits: No traumatic or inflammatory finding within the orbits. Status post bilateral lens  replacements. Sinuses: Mild mucosal thickening in the ethmoid air cells. Soft tissues: Large left frontal scalp and periorbital hematoma with laceration. CT CERVICAL SPINE FINDINGS Alignment: No traumatic listhesis. Trace anterolisthesis of C5 on C6, C6 on C7, and C7 on T1, which appears facet mediated. Levocurvature of the cervical spine Skull base and vertebrae: No acute fracture. No primary bone lesion or focal pathologic process. Soft tissues and spinal canal: No prevertebral fluid or swelling. No visible canal hematoma. Disc levels: Degenerative changes in the cervical spine. No high-grade spinal canal stenosis. Upper chest: Negative. IMPRESSION: 1. No acute intracranial abnormality. 2. Large left frontal scalp and periorbital hematoma with laceration. No facial bone fracture. 3. No acute fracture or traumatic  listhesis of the cervical spine. Electronically Signed   By: Wiliam Ke M.D.   On: 01/17/2023 21:23   DG Elbow 2 Views Left  Result Date: 01/17/2023 CLINICAL DATA:  Recent fall with left elbow pain, initial encounter EXAM: LEFT ELBOW - 2 VIEW COMPARISON:  None Available. FINDINGS: Mild osteophytic changes are noted along the radial head. No joint effusion is seen. No acute fracture or dislocation is noted. IMPRESSION: Mild degenerative change without acute abnormality. Electronically Signed   By: Alcide Clever M.D.   On: 01/17/2023 21:12   DG Pelvis Portable  Result Date: 01/17/2023 CLINICAL DATA:  Recent fall with pelvic pain, initial encounter EXAM: PORTABLE PELVIS 1 VIEWS COMPARISON:  None Available. FINDINGS: Pelvic ring appears intact. Mild degenerative changes of the hip joints are seen. Soft tissue prominence is noted laterally on the left adjacent to the left hip likely related to a subcutaneous hematoma. No other bony abnormality is noted. IMPRESSION: No acute bony abnormality noted. Mild soft tissue swelling is noted over the left hip. Electronically Signed   By: Alcide Clever M.D.   On: 01/17/2023 21:04   DG Chest Port 1 View  Result Date: 01/17/2023 CLINICAL DATA:  Recent fall with chest pain, initial encounter EXAM: PORTABLE CHEST 1 VIEW COMPARISON:  11/02/22 FINDINGS: Cardiac shadow is within normal limits. The lungs are well aerated bilaterally. No focal infiltrate or sizable effusion is seen. Postsurgical changes in the left axilla are again noted. No bony abnormality is seen. IMPRESSION: No acute abnormality noted. Electronically Signed   By: Alcide Clever M.D.   On: 01/17/2023 21:00    Procedures Procedures    Medications Ordered in ED Medications  morphine (PF) 2 MG/ML injection 2 mg (2 mg Intravenous Given 01/17/23 1948)  ondansetron (ZOFRAN) injection 4 mg (4 mg Intravenous Given 01/17/23 1947)    ED Course/ Medical Decision Making/ A&P                                  Medical Decision Making Patient complains of tripping over a drain and hitting her head left elbow left hand and left hip.  Patient is on Plavix she did not lose consciousness  Amount and/or Complexity of Data Reviewed Independent Historian: EMS    Details: Brought in by EMS.  They report patient was awake and alert Labs: ordered. Decision-making details documented in ED Course.    Details: Labs ordered reviewed and interpreted.  Glucose is 111.  Lactic acid is normal Radiology: ordered and independent interpretation performed. Decision-making details documented in ED Course.    Details: X-rays ordered reviewed and interpreted.  CT head CT cervical spine and CT maxillofacial showed no acute findings.  Risk Prescription drug  management.            Final Clinical Impression(s) / ED Diagnoses Final diagnoses:  Fall, initial encounter  Contusion of face, initial encounter  Abrasion of face, initial encounter  Abrasion of left elbow, initial encounter  Contusion of left hand, initial encounter    Rx / DC Orders ED Discharge Orders     None      An After Visit Summary was printed and given to the patient.    Elson Areas, PA-C 01/18/23 [id number]    Gwyneth Sprout, MD 01/22/23 1318

## 2023-01-18 NOTE — ED Notes (Signed)
[facility] called for pt. Discharge instructions gone over w pt; patient verbalizes understanding of discharge instructions. Opportunity for questioning and answers were provided. Pt discharged from ED stable & ambulatory.

## 2023-01-19 ENCOUNTER — Telehealth: Payer: Self-pay | Admitting: Cardiology

## 2023-01-19 DIAGNOSIS — R69 Illness, unspecified: Secondary | ICD-10-CM | POA: Diagnosis not present

## 2023-01-19 NOTE — Telephone Encounter (Signed)
Patient is calling requesting a callback to discuss follow up care from her recent hospital visit prior to her upcoming appointment next week.   Please advise.

## 2023-01-19 NOTE — Progress Notes (Signed)
Cardiology Office Note:    Date:  01/27/2023   ID:  Beth, Simon [birth year], MRN [id number]  PCP:  Thana Ates, MD  Cardiologist:  Peter Swaziland, MD     Referring MD: Thana Ates, MD   Chief Complaint: ED follow-up after a mechanical fall  History of Present Illness:    Beth Simon is a 82 y.o. female with a history of CAD with recent NSTEMI on 09/2022 (cath showed diffuse disease but anatomy not favorable for PCI so treated medically), chronic combined CHF with EF of 30-35% on Echo in 09/2022, hypertension, hyperlipidemia, anemia, breast cancer s/p chemo and mastectomy, and uterine cancer s/p radiation and hysterectomy who is followed by Dr. Swaziland and presents today for ED follow-up after a mechanical fall.   Patient was first seen by Cardiology during an admission in 09/2022 for NSTEMI after presenting with palpitations, uncontrolled BP, and mild chest discomfort. High-sensitivity troponin  53 >> 2,054. Echo showed LVEF of 30-35% with akinesis of the distal 2/3 of the LV with apical ballooning (suggestive of possible stress-induced cardiomyopathy vs LAD ischemia) as well as grade 2 diastolic dysfunction and mild MR. R/LHC showed showed 45% stenosis of proximal to mid LAD at major 1st Diag followed by 3 tandem 70%, 70%, and 85% stenoses of mid LAD with diffuse moderate diease throughout as well as 60% stenosis of the 1st Diag and 90% stenosis of LPDA. Right and left heart pressures were completely normal but cardiac output and index were mildly reduced at 3.86 and 2.51 respectively. Anatomy was not felt to be favorable for PCI; therefore, medical therapy recommended. Differential felt to be NSTEMI due to demand ischemia vs small aborted type 1 myocardial infarction vs Takotsubo syndrome. However, she was treated with IV Heparin for 72 hours and started on DAPT.   She was seen in the ED twice in 10/2022 for elevated BP which was felt to be due to anxiety. She was noted to have very  labile BP at initial follow-up visit so was started on PRN Hydralazine for BP >160/100.   She was last seen by Carlos Levering, NP, in 11/2022 for follow-up of hypertension at which time home BP log showed BP ranged from 90-120/50-70. Her only complaint at that time was fatigue but she denied any chest pain, shortness of breath, lightheadedness, dizziness, or syncope. Therefore, no medication changes were made.   She was recently seen in the ED on 01/17/2023 after a mechanical fall where she tripped on a gutter and struck her head. She denied any loss of consciousness at that time. CT showed a large frontal scalp and periorbital hematoma with laceration but no acute intracranial abnormalities, facial bone fractures, or C-spine fractures. She was felt to be stable for discharge from the ED. She called our office on 01/19/2023 and reported bruising and swelling was getting worse so she was advised to hold Plavix for 5 days.   She presents today for follow-up. Here alone. We discussed her recent fall. She states she was walking through a flower bed to her neighbors house and tripped on a gutter/ piece of concrete and fell face down (she was carrying something so was unable to catch herself). She denies any lightheadedness, dizziness, or syncope around this event. She still has significant bruising on her face/ neck and left arm from the fall. She did hold her Plavix for 5 days as instructed and is now back on this. Bruising has not worsened after resuming this.  She is overall doing well from a cardiac standpoint. She denies any chest pain, shortness, of breath, orthopnea, PND, edema, or palpitations. She does report feeling fatigued but this is not new as well as some weakness in her legs sense her fall. She states her legs will feel like "jelly" at times and she will have to sit down because she is worried that she will get lightheaded if she does not. Again, this has just been since her fall. She denies any  claudication. She also reports some mild changes her vision since her MI in 09/2022 and is wondering if it could be due to any of her cardiac medications. I reviewed these and do not think this is the cause.   She brings in a log of her BP/ HR. She has had one isolated episode of hypotension on evening of 01/16/2023 with BP of 86/63 but states she was asymptomatic at this time. Otherwise, BP has ranged from 100-124/55-73. HR typically in the 50s to 60s.  EKGs/Labs/Other Studies Reviewed:    The following studies were reviewed:  Echocardiogram 10/17/2022: Impressions: 1. There is akinesis of the distal 2/3 of the left ventricle with apical  ballooning suggestive of possible Tako-Tsubo (stress-induced)  cardiomyopathy. Left ventricular ejection fraction, by estimation, is 30  to 35%. The left ventricle has moderately  decreased function. The left ventricle demonstrates regional wall motion  abnormalities (see scoring diagram/findings for description). Left  ventricular diastolic parameters are consistent with Grade II diastolic  dysfunction (pseudonormalization).   2. Right ventricular systolic function is normal. The right ventricular  size is normal. There is normal pulmonary artery systolic pressure.   3. Left atrial size was mild to moderately dilated.   4. The mitral valve is normal in structure. Mild mitral valve  regurgitation. No evidence of mitral stenosis.   5. The aortic valve is tricuspid. There is mild calcification of the  aortic valve. Aortic valve regurgitation is trivial. Aortic valve  sclerosis/calcification is present, without any evidence of aortic  stenosis.   6. The inferior vena cava is normal in size with greater than 50%  respiratory variability, suggesting right atrial pressure of 3 mmHg.  _______________   Right/ Left Cardiac Catheterization 10/17/2022:   Prox LAD to Mid LAD lesion is 45% stenosed at major 1st Diag followed by Mid LAD-1 lesion is 70% stenosed-  just after 1st Sept (with ~ berry aneurysmal segment.  Not favorable for PCI.   Mid LAD-2 lesion is 70% stenosed.  Mid LAD-3 lesion is 85% stenosed. With difuse moderate disease throughout.   1st Diag lesion is 60% stenosed.   LPDA lesion is 90% stenosed.   LV end diastolic pressure is normal.   There is no aortic valve stenosis.   POST-CATH DIAGNOSES Severe diffuse LAD disease and focal left PDA disease of the left dominant system and separate ostia of LAD and dominant LCx. LAD has multiple sequential lesions ranging from 40% to 85-90% with 1 area involving branch points of major diagonal branch and septal perforator not very favorable for PCI especially given extent of calcification. Focal PDA 90% stenosis just prior to a very tortuous area.  (PCI amenable, but question of usefulness with the extent of disease in LAD). Well compensated CHF with completely normal Right Heart Cath and LV Pressures, but notably reduced Cardiac Output and Index (3.86, 2.51) PAP-mean 21/11-16 million mercury, PCWP 6 mm, LV P-EDP 128/2-11 mmHg with a AO P-MAP 124/60-86 mmHg. Ao sat 94%, PA sat 64%.  RECOMMENDATIONS In the absence of any other complications or medical issues, we expect the patient to be ready for discharge from a cath perspective. Would optimize medical therapy for CAD and cardiomyopathy.  Appears to be well compensated. If she has worsening dyspnea on exertion or anginal equivalent type symptoms, could consider reevaluating PCI options, but for now would plan to treat medically.  Stabilized medically prior to discharge. Reinitiate IV heparin 2 hours after TR band removal and run for total of 48 hours. Based on likely ACS presentation and existing disease, would recommend at least 3 to 6 months of DAPT followed by completion of 1 year Plavix monotherapy. Will DC lisinopril with plans to potentially convert to ARB plus or minus Entresto.   Diagnostic Dominance: Right       EKG:  EKG not ordered  today.   Recent Labs: 10/17/2022: B Natriuretic Peptide 33.3; TSH 2.258 10/31/2022: Magnesium 2.2 01/17/2023: ALT 32; BUN 12; Creatinine, Ser 0.70; Hemoglobin 12.9; Platelets 218; Potassium 3.8; Sodium 137  Recent Lipid Panel    Component Value Date/Time   CHOL 149 12/25/2022 0922   TRIG 65 12/25/2022 0922   HDL 60 12/25/2022 0922   CHOLHDL 2.5 12/25/2022 0922   CHOLHDL 2.6 10/17/2022 1216   VLDL 16 10/17/2022 1216   LDLCALC 76 12/25/2022 0922    Physical Exam:    Vital Signs: BP 128/68 (BP Location: Right Arm, Patient Position: Sitting, Cuff Size: Normal)   Pulse 73   Ht 4' 10.5" (1.486 m)   Wt 125 lb 12.8 oz (57.1 kg)   LMP 03/31/1988 (Approximate)   SpO2 98%   BMI 25.84 kg/m     Wt Readings from Last 3 Encounters:  01/27/23 125 lb 12.8 oz (57.1 kg)  01/17/23 125 lb (56.7 kg)  12/15/22 124 lb (56.2 kg)     General: 82 y.o. Caucasian female in no acute distress. HEENT: Normocephalic and atraumatic. Sclera clear.  Neck: Supple. No carotid bruits. No JVD. Heart: RRR. Distinct S1 and S2. No murmurs, gallops, or rubs.  Lungs: No increased work of breathing. Clear to ausculation bilaterally. No wheezes, rhonchi, or rales.  Abdomen: Soft, non-distended, and non-tender to palpation.  Extremities: No lower extremity edema.   Skin: Warm and dry. Patient has significant ecchymosis on face (left > right) and left arm from recent mechanical fall. Neuro: No focal deficits. Psych: Normal affect. Responds appropriately.   Assessment:    1. Coronary artery disease involving native coronary artery of native heart without angina pectoris   2. Chronic combined systolic and diastolic CHF (congestive heart failure) (HCC)   3. Primary hypertension   4. Hyperlipidemia, unspecified hyperlipidemia type   5. Fatigue, unspecified type   6. Weakness of both lower extremities     Plan:    CAD  History of NSTEMI in 09/2022. LHC showed severe diffuse LAD disease and focal left PDA disease.  Anatomy not favorable for PCI so medical therapy recommended. Differential felt to be NSTEMI due to demand ischemia vs small aborted type 1 myocardial infarction vs Takotsubo syndrome. However, she was treated with IV Heparin for 72 hours and started on DAPT.  - No chest pain.  - Continue DAPT with Aspirin and Plavix. Plan to continue this for 12 months after MI and then Aspirin alone.  - Continue beta-blocker and statin.   Chronic Combined CHF Echo during recent admission LVEF of 30-35% with akinesis of the distal 2/3 of the LV with apical ballooning (suggestive of possible stress-induced cardiomyopathy vs  LAD ischemia) as well as grade 2 diastolic dysfunction and mild MR. LHC showed severe diffuse LAD disease as above. Right and left heart pressures were completely normal but cardiac output and index were mildly reduced at 3.86 and 2.51 respectively. GDMT limited by labile BP. - Euvolemic on exam.  - Continue Losartan 25mg  daily.  - Will decrease Coreg to 3.125mg  twice daily given soft BP and low resting HR. - Continue Farxiga 10mg  daily. - Will repeat Echo to reassess LV function.    Hypertension She has a history of labile hypertension with anxiety felt to be playing a large role in elevated readings.  - BP well controlled today. Reviewed home BP log. She had one isolated episode of hypotension with BP of 86/63 but states she was asymptomatic at this time. Otherwise, BP has ranged from 100-124/55-73. HR typically in the 50s to 60s. - Will decrease Coreg to 3.125mg  twice daily given soft BP and low resting HR. - Continue Losartan 25mg  daily. - Continue Hydralazine 25mg  PRN for BP >160/100 (can take up to twice daily). She has not needed this recently.   Hyperlipidemia Lipid panel in 11/2022: Total Cholesterol 149, Triglycerides 65, HDL 60, LDL 76. LDL goal <55 under new guidelines.  - Continue Lipitor 40mg  daily. - LDL is not quite at goal. Considered increasing Lipitor to 80mg  daily.  However, she has been having some leg weakness recently after recent fall. Therefore, will let her continue to recover from this before increasing statin which could cloud the picture. Can re-discuss increasing statin at follow-up visit.   Fatigue  Leg Weakness Patient continues to reports some fatigue which is not new. However, she also reports some bilateral leg weakness since her fall on 01/17/2023.  - Will decrease Coreg to see if this helps some of her fatigue. - Suspect weakness will improve as she continues to recover from fall and as she restarts cardiac rehab.  Disposition: Patient already has a follow-up visit with Dr. Swaziland scheduled for 04/2023.    Signed, Corrin Parker, PA-C  01/27/2023 9:31 AM    [facility]

## 2023-01-19 NOTE — Telephone Encounter (Signed)
Spoke with patient and message relayed to hold Plavix for 5 days.

## 2023-01-19 NOTE — Telephone Encounter (Signed)
Called and spoke with patient about her fall she had on Saturday. She has concerns about what to do about her Plavix and wound care. She report her bruising and swelling is getting worse. Went over wound care instructions per discharge instructions. Please advised if pt should hold or stop Plavix. Also advised that she does follow up with PCP. Patient verbalized understanding and agree.

## 2023-01-20 ENCOUNTER — Telehealth (HOSPITAL_COMMUNITY): Payer: Self-pay | Admitting: *Deleted

## 2023-01-20 DIAGNOSIS — T148XXD Other injury of unspecified body region, subsequent encounter: Secondary | ICD-10-CM | POA: Diagnosis not present

## 2023-01-20 DIAGNOSIS — S0083XD Contusion of other part of head, subsequent encounter: Secondary | ICD-10-CM | POA: Diagnosis not present

## 2023-01-20 NOTE — Progress Notes (Signed)
Cardiac Individual Treatment Plan  Patient Details  Name: Beth Simon MRN: [id number] Date of Birth: Nov 19, 1940 Referring Provider:   Flowsheet Row INTENSIVE CARDIAC REHAB ORIENT from 11/13/2022 in [facility]  Referring Provider Peter Swaziland, MD       Initial Encounter Date:  Flowsheet Row INTENSIVE CARDIAC REHAB ORIENT from 11/13/2022 in [facility]  Date 11/13/22       Visit Diagnosis: 10/17/22 NSTEMI (non-ST elevated myocardial infarction) [facility])  Patient's Home Medications on Admission:  Current Outpatient Medications:    aspirin EC (ASPIRIN LOW DOSE) 81 MG tablet, TAKE 1 TABLET (81 MG TOTAL) BY MOUTH DAILY. SWALLOW WHOLE., Disp: 90 tablet, Rfl: 3   atorvastatin (LIPITOR) 40 MG tablet, TAKE 1 TABLET BY MOUTH EVERY DAY, Disp: 90 tablet, Rfl: 3   carvedilol (COREG) 6.25 MG tablet, Take 1 tablet (6.25 mg total) by mouth 2 (two) times daily., Disp: 180 tablet, Rfl: 3   clopidogrel (PLAVIX) 75 MG tablet, TAKE 1 TABLET BY MOUTH DAILY WITH BREAKFAST., Disp: 90 tablet, Rfl: 3   Cyanocobalamin (VITAMIN B 12 PO), Take 1 tablet by mouth every morning. , Disp: , Rfl:    dapagliflozin propanediol (FARXIGA) 10 MG TABS tablet, Take 1 tablet (10 mg total) by mouth daily before breakfast., Disp: 90 tablet, Rfl: 3   denosumab (PROLIA) 60 MG/ML SOLN injection, Inject 60 mg into the skin every 6 (six) months. Administer in upper arm, thigh, or abdomen, Disp: , Rfl:    Flaxseed, Linseed, (FLAX SEEDS PO), Take 1 scoop by mouth every morning. Ground flaxseed in cereal., Disp: , Rfl:    hydrALAZINE (APRESOLINE) 25 MG tablet, Take 1 tablet (25 mg total) by mouth as needed (for blood pressure greater than 160/100. May take up to twice daily.)., Disp: 180 tablet, Rfl: 3   losartan (COZAAR) 25 MG tablet, Take 1 tablet (25 mg total) by mouth daily., Disp: 90 tablet, Rfl: 3   Multiple  Vitamins-Minerals (PRESERVISION AREDS) CAPS, Take 1 capsule by mouth daily., Disp: , Rfl:    OVER THE COUNTER MEDICATION, Take 1 tablet by mouth every morning. Astazanthin. (anti-oxidant), Disp: , Rfl:    VITAMIN D, CHOLECALCIFEROL, PO, Take 4,000 Int'l Units by mouth every morning. , Disp: , Rfl:   Past Medical History: Past Medical History:  Diagnosis Date   Adenomatous polyps 01/2016   Anemia    Breast cancer (HCC) 1990   bilateral mastectomy, adenoca breast-left MRM, reconstruction, chemo   Bronchitis last 2 weeks   saw dr Pete Glatter 02-28-2014, he said no antibiotics needed, nonproductive cough   Complication of anesthesia    Cystitis    cytoxen, had once or twice   Family history of breast cancer    History of breast cancer    History of radiation therapy 2/10, 2/12, 2/18, 2/24, 05/29/14   vaginal cuff/ 30 Gy/ 5 fx   Hypertension    Macular degeneration    Neck pain    taking physictal therapy last 2 weeks   Osteoporosis    On Prolia.   PONV (postoperative nausea and vomiting)    Shoulder pain    taking physical therapy for last few weeks   Uterine cancer (HCC) 03/21/2014   MLH1/PMS2 LOH   Uterine fibroid    VAIN I (vaginal intraepithelial neoplasia grade I) 2018   positive HR HPV.   Vasovagal syncopes     Tobacco Use: Social History  Tobacco Use  Smoking Status Never  Smokeless Tobacco Never    Labs: Review Flowsheet       Latest Ref Rng & Units 10/07/2013 10/17/2022 12/25/2022 01/17/2023  Labs for ITP Cardiac and Pulmonary Rehab  Cholestrol 100 - 199 mg/dL - 952  841  -  LDL (calc) 0 - 99 mg/dL - 324  76  -  HDL-C >40 mg/dL - 78  60  -  Trlycerides 0 - 149 mg/dL - 79  65  -  Hemoglobin A1c 4.8 - 5.6 % 5.9  5.7  - -  PH, Arterial 7.35 - 7.45 - 7.410  - -  PCO2 arterial 32 - 48 mmHg - 33.2  - -  Bicarbonate 20.0 - 28.0 mmol/L 20.0 - 28.0 mmol/L - 22.2  23.8  21.0  - -  TCO2 22 - 32 mmol/L - 23  25  22  23   - 22   Acid-base deficit 0.0 - 2.0 mmol/L - 2.0   3.0  - -  O2 Saturation % % - 64  63  94  - -    Details       Multiple values from one day are sorted in reverse-chronological order         Capillary Blood Glucose: Lab Results  Component Value Date   GLUCAP 80 05/09/2014     Exercise Target Goals: Exercise Program Goal: Individual exercise prescription set using results from initial 6 min walk test and THRR while considering  patient's activity barriers and safety.   Exercise Prescription Goal: Initial exercise prescription builds to 30-45 minutes a day of aerobic activity, 2-3 days per week.  Home exercise guidelines will be given to patient during program as part of exercise prescription that the participant will acknowledge.  Activity Barriers & Risk Stratification:  Activity Barriers & Cardiac Risk Stratification - 11/13/22 1148       Activity Barriers & Cardiac Risk Stratification   Activity Barriers Decreased Ventricular Function;Chest Pain/Angina    Cardiac Risk Stratification High   <5 METs on            6 Minute Walk:  6 Minute Walk     Row Name 11/13/22 1145         6 Minute Walk   Phase Initial     Distance 480 feet     Walk Time 6 minutes     # of Rest Breaks 1  2:00-6:00     MPH 0.91     METS 0.83     RPE 13     Perceived Dyspnea  0     VO2 Peak 2.91     Symptoms Yes (comment)     Comments Chest pressure reported at 2 min mark. Test aborted, BP 190/72, 98% NSR. No other symptoms reported, pressure resolved once seated. RN Beth Simon made aware, contacted Dr. Swaziland.     Resting HR 62 bpm     Resting BP 148/72     Resting Oxygen Saturation  97 %     Exercise Oxygen Saturation  during 6 min walk 98 %     Max Ex. HR 82 bpm     Max Ex. BP 190/72     2 Minute Post BP 188/70              Oxygen Initial Assessment:   Oxygen Re-Evaluation:   Oxygen Discharge (Final Oxygen Re-Evaluation):   Initial Exercise Prescription:  Initial Exercise Prescription - 11/13/22 1100  Date of Initial Exercise RX and Referring Provider   Date 11/13/22    Referring Provider Peter Swaziland, MD    Expected Discharge Date 02/04/23      NuStep   Level 1    SPM 60    Minutes 15    METs 1.8      Track   Laps 15    Minutes 15    METs 2      Prescription Details   Frequency (times per week) 3    Duration Progress to 30 minutes of continuous aerobic without signs/symptoms of physical distress      Intensity   THRR 40-80% of Max Heartrate 55-110    Ratings of Perceived Exertion 11-13    Perceived Dyspnea 0-4      Progression   Progression Continue progressive overload as per policy without signs/symptoms or physical distress.      Resistance Training   Training Prescription Yes    Weight 2    Reps 10-15             Perform Capillary Blood Glucose checks as needed.  Exercise Prescription Changes:   Exercise Prescription Changes     Row Name 11/21/22 1300 12/03/22 1500 12/19/22 1500 01/09/23 1500       Response to Exercise   Blood Pressure (Admit) 140/72 126/72 122/70 118/72    Blood Pressure (Exercise) 154/72 134/70 132/70 --    Blood Pressure (Exit) 132/70 130/70 116/62 104/62    Heart Rate (Admit) 61 bpm 56 bpm 61 bpm 59 bpm    Heart Rate (Exercise) 82 bpm 105 bpm 92 bpm 100 bpm    Heart Rate (Exit) 59 bpm 64 bpm 69 bpm 68 bpm    Rating of Perceived Exertion (Exercise) 9 12 13 12     Symptoms None None none None    Comments Pt's first day in the CRP2 program Reviewed METs Reviewed METs and brainstorimg HEP Reviewed METs, goals and HERx    Duration Continue with 30 min of aerobic exercise without signs/symptoms of physical distress. Continue with 30 min of aerobic exercise without signs/symptoms of physical distress. Continue with 30 min of aerobic exercise without signs/symptoms of physical distress. Continue with 30 min of aerobic exercise without signs/symptoms of physical distress.    Intensity THRR unchanged THRR unchanged THRR unchanged THRR  unchanged      Progression   Progression Continue to progress workloads to maintain intensity without signs/symptoms of physical distress. Continue to progress workloads to maintain intensity without signs/symptoms of physical distress. Continue to progress workloads to maintain intensity without signs/symptoms of physical distress. Continue to progress workloads to maintain intensity without signs/symptoms of physical distress.    Average METs 1.75 2.7 3.4 3.55      Resistance Training   Training Prescription Yes No Yes Yes    Weight 2 lbs No weights on wednesdays 2 2    Reps 10-15 -- 10-15 10-15    Time 10 Minutes -- 10 Minutes 10 Minutes      Interval Training   Interval Training No No -- No      Recumbant Bike   Level -- 1 -- --    RPM -- 75 -- --    Watts -- 16 -- --    Minutes -- 15 -- --    METs -- 2 -- --      NuStep   Level 1 -- -- --    SPM 45 -- -- --    Minutes  15 -- -- --    METs 1.2 -- -- --      Track   Laps 10 19 20 30  23  laps first 15, 17 laps 2nd 15 Avg METS 3.55 (30 laps total)    Minutes 15 15 15 30     METs 2.28 3.43 3.55 3.55      Home Exercise Plan   Plans to continue exercise at -- -- -- Home (comment)    Frequency -- -- -- Add 2 additional days to program exercise sessions.    Initial Home Exercises Provided -- -- -- 01/09/23             Exercise Comments:   Exercise Comments     Row Name 11/21/22 1402 12/03/22 1500 01/09/23 1500       Exercise Comments Pt's first day in the CRP2 program. Pt exercised today without complaints. Reviewed METs. Pt is making good progess, peak METs are 3.4.  Changed to recumebnt bike today because nustep was bothering her back. Back feels more comfortable on bike. Reviewed METs, goals and HERx. Pt is making good progess. Pt enjoys walking so pat is doing this as her 30 minutes of exercise here in the CRP2 program.              Exercise Goals and Review:   Exercise Goals     Row Name 11/13/22 0821              Exercise Goals   Increase Physical Activity Yes       Intervention Provide advice, education, support and counseling about physical activity/exercise needs.;Develop an individualized exercise prescription for aerobic and resistive training based on initial evaluation findings, risk stratification, comorbidities and participant's personal goals.       Expected Outcomes Short Term: Attend rehab on a regular basis to increase amount of physical activity.;Long Term: Exercising regularly at least 3-5 days a week.;Long Term: Add in home exercise to make exercise part of routine and to increase amount of physical activity.       Increase Strength and Stamina Yes       Intervention Provide advice, education, support and counseling about physical activity/exercise needs.;Develop an individualized exercise prescription for aerobic and resistive training based on initial evaluation findings, risk stratification, comorbidities and participant's personal goals.       Expected Outcomes Short Term: Perform resistance training exercises routinely during rehab and add in resistance training at home;Short Term: Increase workloads from initial exercise prescription for resistance, speed, and METs.;Long Term: Improve cardiorespiratory fitness, muscular endurance and strength as measured by increased METs and functional capacity ( )       Able to understand and use rate of perceived exertion (RPE) scale Yes       Intervention Provide education and explanation on how to use RPE scale       Expected Outcomes Short Term: Able to use RPE daily in rehab to express subjective intensity level;Long Term:  Able to use RPE to guide intensity level when exercising independently       Knowledge and understanding of Target Heart Rate Range (THRR) Yes       Intervention Provide education and explanation of THRR including how the numbers were predicted and where they are located for reference       Expected Outcomes Short  Term: Able to state/look up THRR;Short Term: Able to use daily as guideline for intensity in rehab;Long Term: Able to use THRR to govern intensity when exercising independently  Understanding of Exercise Prescription Yes       Intervention Provide education, explanation, and written materials on patient's individual exercise prescription       Expected Outcomes Short Term: Able to explain program exercise prescription;Long Term: Able to explain home exercise prescription to exercise independently                Exercise Goals Re-Evaluation :  Exercise Goals Re-Evaluation     Row Name 11/21/22 1401 12/19/22 1503 01/09/23 1500         Exercise Goal Re-Evaluation   Exercise Goals Review Increase Physical Activity;Increase Strength and Stamina;Able to understand and use rate of perceived exertion (RPE) scale;Knowledge and understanding of Target Heart Rate Range (THRR);Understanding of Exercise Prescription -- Increase Physical Activity;Increase Strength and Stamina;Able to understand and use rate of perceived exertion (RPE) scale;Knowledge and understanding of Target Heart Rate Range (THRR);Understanding of Exercise Prescription     Comments Pt's first day in the CRP2 program. Pt understands the THRR, RPE sclae and exercise Rx. Reviewed METs and Goals and discussed HEP with pt. Pt is proud of her exercise progress but admits she is not yet independent in preparing meals for herself at home. She admits to buying [employer] and adding more vegetables to make it healthier. I asked pt to meet with our dietician to help her reach her goals.  Pt reports that she likes to walk but feels unsafe walking by herself. She does walk x1/week outdoors with her church group and attends a pilates class x1/week but wants to do more. She does not like going to the [facility], I will brainstorm and create a definitive plan with patient soon. Reviewed METs, goals and HERx. Pt voices that she is making good progress  on her goal of increased strength and stamina. Pt is still working on her healthy cooking for one person. Pt walks with her group of friends and encouraged her to try to add another day of walking. Suggested pateint could go to a home improvement store and walk inside, and pt would feel safe. Will follow up on her progress in making a plan.     Expected Outcomes Will continue to monitor patient and progress exercise workloads as tolerated. -- Will continue to monitor patient and progress exercise workloads as tolerated.              Discharge Exercise Prescription (Final Exercise Prescription Changes):  Exercise Prescription Changes - 01/09/23 1500       Response to Exercise   Blood Pressure (Admit) 118/72    Blood Pressure (Exit) 104/62    Heart Rate (Admit) 59 bpm    Heart Rate (Exercise) 100 bpm    Heart Rate (Exit) 68 bpm    Rating of Perceived Exertion (Exercise) 12    Symptoms None    Comments Reviewed METs, goals and HERx    Duration Continue with 30 min of aerobic exercise without signs/symptoms of physical distress.    Intensity THRR unchanged      Progression   Progression Continue to progress workloads to maintain intensity without signs/symptoms of physical distress.    Average METs 3.55      Resistance Training   Training Prescription Yes    Weight 2    Reps 10-15    Time 10 Minutes      Interval Training   Interval Training No      Track   Laps 30   23 laps first 15, 17 laps 2nd 15 Avg  METS 3.55 (30 laps total)   Minutes 30    METs 3.55      Home Exercise Plan   Plans to continue exercise at Home (comment)    Frequency Add 2 additional days to program exercise sessions.    Initial Home Exercises Provided 01/09/23             Nutrition:  Target Goals: Understanding of nutrition guidelines, daily intake of sodium 1500mg , cholesterol 200mg , calories 30% from fat and 7% or less from saturated fats, daily to have 5 or more servings of fruits and  vegetables.  Biometrics:  Pre Biometrics - 11/13/22 0804       Pre Biometrics   Waist Circumference 34.5 inches    Hip Circumference 39 inches    Waist to Hip Ratio 0.88 %    Triceps Skinfold 26 mm    % Body Fat 39.6 %    Grip Strength 22 kg    Flexibility 12 in    Single Leg Stand 13.2 seconds              Nutrition Therapy Plan and Nutrition Goals:  Nutrition Therapy & Goals - 01/20/23 1428       Nutrition Therapy   Diet Heart Healthy Diet    Drug/Food Interactions Statins/Certain Fruits      Personal Nutrition Goals   Nutrition Goal Patient to identify strategies for reducing cardiovascular risk by attending the [employer] education and nutrition series weekly.   goal in progress.   Personal Goal #2 Patient to improve diet quality by using the plate method as a guide for meal planning to include lean protein/plant protein, fruits, vegetables, whole grains, nonfat dairy as part of a well-balanced diet   goal in progress.   Personal Goal #3 Patient to decrease sodium 1500mg  per day   goal in progress.   Comments Goals in progress. Beth Simon continues to attend the [employer] and nutrition series regularly. Prior to starting cardiac rehab, Ayriah reports that she has already started making some dietary changes including eating three meals daily and reduced sugar/refined carbohydrate intake. She has started cooking more using her crockpot and is implementing more vegetables and whole grains. She reports weight loss of ~8# since initial NSTEMI hospitalization on 7/19 at 132#. She is motivated to continue to lose weight with goal weight of ~115#. She is up 1.3# since starting with our program. Her LDL has improved to <100 (76). Patient will benefit from participation in intensive cardiac rehab for nutrition, exercise, and lifestyle modification.      Intervention Plan   Intervention Prescribe, educate and counsel regarding individualized specific dietary modifications aiming  towards targeted core components such as weight, hypertension, lipid management, diabetes, heart failure and other comorbidities.;Nutrition handout(s) given to patient.    Expected Outcomes Short Term Goal: Understand basic principles of dietary content, such as calories, fat, sodium, cholesterol and nutrients.;Long Term Goal: Adherence to prescribed nutrition plan.             Nutrition Assessments:  Nutrition Assessments - 11/20/22 1448       Rate Your Plate Scores   Pre Score 76            MEDIFICTS Score Key: >=70 Need to make dietary changes  40-70 Heart Healthy Diet <= 40 Therapeutic Level Cholesterol Diet   Flowsheet Row INTENSIVE CARDIAC REHAB from 11/19/2022 in [facility]  Picture Your Plate Total Score on Admission 76  Picture Your Plate Scores: <82 Unhealthy dietary pattern with much room for improvement. 41-50 Dietary pattern unlikely to meet recommendations for good health and room for improvement. 51-60 More healthful dietary pattern, with some room for improvement.  >60 Healthy dietary pattern, although there may be some specific behaviors that could be improved.    Nutrition Goals Re-Evaluation:  Nutrition Goals Re-Evaluation     Row Name 11/21/22 1304 12/22/22 1624 01/20/23 1428         Goals   Current Weight 127 lb 3.3 oz (57.7 kg) 126 lb 5.2 oz (57.3 kg) 128 lb 8.5 oz (58.3 kg)     Comment lipoproteinA 104, A1c 5.7, cholesterol 205, LDL 111 no new labs; most recent labs lipoproteinA 104, A1c 5.7, cholesterol 205, LDL 111 (lipitor) LDL 76; other most recent labs lipoproteinA 104, A1c 5.7     Expected Outcome Beth Simon reports that she has already started making some dietary changes including eating three meals daily and reduced sugar/refined carbohydrate intake. She reports weight loss of ~7# since initial NSTEMI hospitalization on 7/19 at 132#. She is motivated to continue to lose weight  with goal weight of ~115#. She is currently using [employer] meal delivery services (14 days worth of meals). Patient will benefit from participation in intensive cardiac rehab for nutrition, exercise, and lifestyle modification. Goals in progress. Beth Simon continues to attend the [employer] and nutrition series regularly. Prior to starting cardiac rehab, Beth Simon reports that she has already started making some dietary changes including eating three meals daily and reduced sugar/refined carbohydrate intake. She reports weight loss of ~8# since initial NSTEMI hospitalization on 7/19 at 132#. She is motivated to continue to lose weight with goal weight of ~115#. She has maintained her weight since starting with our program. She is currently using [employer] meal delivery services (14 days worth of meals). Patient will benefit from participation in intensive cardiac rehab for nutrition, exercise, and lifestyle modification. Goals in progress. Beth Simon continues to attend the [employer] and nutrition series regularly. Prior to starting cardiac rehab, Kylani reports that she has already started making some dietary changes including eating three meals daily and reduced sugar/refined carbohydrate intake. She has started cooking more using her crockpot and is implementing more vegetables and whole grains. She reports weight loss of ~8# since initial NSTEMI hospitalization on 7/19 at 132#. She is motivated to continue to lose weight with goal weight of ~115#. She is up 1.3# since starting with our program. Her LDL has improved to <100 (76). Patient will benefit from participation in intensive cardiac rehab for nutrition, exercise, and lifestyle modification.              Nutrition Goals Re-Evaluation:  Nutrition Goals Re-Evaluation     Row Name 11/21/22 1304 12/22/22 1624 01/20/23 1428         Goals   Current Weight 127 lb 3.3 oz (57.7 kg) 126 lb 5.2 oz (57.3 kg) 128 lb 8.5 oz (58.3 kg)     Comment  lipoproteinA 104, A1c 5.7, cholesterol 205, LDL 111 no new labs; most recent labs lipoproteinA 104, A1c 5.7, cholesterol 205, LDL 111 (lipitor) LDL 76; other most recent labs lipoproteinA 104, A1c 5.7     Expected Outcome Beth Simon reports that she has already started making some dietary changes including eating three meals daily and reduced sugar/refined carbohydrate intake. She reports weight loss of ~7# since initial NSTEMI hospitalization on 7/19 at 132#. She is motivated to continue to lose weight with goal weight  of ~115#. She is currently using [employer] meal delivery services (14 days worth of meals). Patient will benefit from participation in intensive cardiac rehab for nutrition, exercise, and lifestyle modification. Goals in progress. Beth Simon continues to attend the [employer] and nutrition series regularly. Prior to starting cardiac rehab, Beth Simon reports that she has already started making some dietary changes including eating three meals daily and reduced sugar/refined carbohydrate intake. She reports weight loss of ~8# since initial NSTEMI hospitalization on 7/19 at 132#. She is motivated to continue to lose weight with goal weight of ~115#. She has maintained her weight since starting with our program. She is currently using [employer] meal delivery services (14 days worth of meals). Patient will benefit from participation in intensive cardiac rehab for nutrition, exercise, and lifestyle modification. Goals in progress. Beth Simon continues to attend the [employer] and nutrition series regularly. Prior to starting cardiac rehab, Beth Simon reports that she has already started making some dietary changes including eating three meals daily and reduced sugar/refined carbohydrate intake. She has started cooking more using her crockpot and is implementing more vegetables and whole grains. She reports weight loss of ~8# since initial NSTEMI hospitalization on 7/19 at 132#. She is motivated to continue  to lose weight with goal weight of ~115#. She is up 1.3# since starting with our program. Her LDL has improved to <100 (76). Patient will benefit from participation in intensive cardiac rehab for nutrition, exercise, and lifestyle modification.              Nutrition Goals Discharge (Final Nutrition Goals Re-Evaluation):  Nutrition Goals Re-Evaluation - 01/20/23 1428       Goals   Current Weight 128 lb 8.5 oz (58.3 kg)    Comment LDL 76; other most recent labs lipoproteinA 104, A1c 5.7    Expected Outcome Goals in progress. Beth Simon continues to attend the [employer] and nutrition series regularly. Prior to starting cardiac rehab, Beth Simon reports that she has already started making some dietary changes including eating three meals daily and reduced sugar/refined carbohydrate intake. She has started cooking more using her crockpot and is implementing more vegetables and whole grains. She reports weight loss of ~8# since initial NSTEMI hospitalization on 7/19 at 132#. She is motivated to continue to lose weight with goal weight of ~115#. She is up 1.3# since starting with our program. Her LDL has improved to <100 (76). Patient will benefit from participation in intensive cardiac rehab for nutrition, exercise, and lifestyle modification.             Psychosocial: Target Goals: Acknowledge presence or absence of significant depression and/or stress, maximize coping skills, provide positive support system. Participant is able to verbalize types and ability to use techniques and skills needed for reducing stress and depression.  Initial Review & Psychosocial Screening:  Initial Psych Review & Screening - 11/13/22 1150       Initial Review   Current issues with None Identified      Family Dynamics   Good Support System? Yes   Beth Simon has her son and friends for support     Barriers   Psychosocial barriers to participate in program There are no identifiable barriers or  psychosocial needs.      Screening Interventions   Interventions Encouraged to exercise;Provide feedback about the scores to participant    Expected Outcomes Short Term goal: Identification and review with participant of any Quality of Life or Depression concerns found by scoring the questionnaire.;Long Term goal: The  participant improves quality of Life and PHQ9 Scores as seen by post scores and/or verbalization of changes             Quality of Life Scores:  Quality of Life - 11/13/22 1151       Quality of Life   Select Quality of Life      Quality of Life Scores   Health/Function Pre 28.18 %    Socioeconomic Pre 27.75 %    Psych/Spiritual Pre 28.93 %    Family Pre 27 %    GLOBAL Pre 28.11 %            Scores of 19 and below usually indicate a poorer quality of life in these areas.  A difference of  2-3 points is a clinically meaningful difference.  A difference of 2-3 points in the total score of the Quality of Life Index has been associated with significant improvement in overall quality of life, self-image, physical symptoms, and general health in studies assessing change in quality of life.  PHQ-9: Review Flowsheet  More data exists      11/13/2022 10/16/2017 10/10/2016 10/05/2015 01/05/2015  Depression screen PHQ 2/9  Decreased Interest 1 0 0 0 1  Down, Depressed, Hopeless 0 0 0 0 1  PHQ - 2 Score 1 0 0 0 2  Altered sleeping 0 - - - -  Tired, decreased energy 1 - - - -  Change in appetite 1 - - - -  Feeling bad or failure about yourself  0 - - - -  Trouble concentrating 0 - - - -  Moving slowly or fidgety/restless 0 - - - -  Suicidal thoughts 0 - - - -  PHQ-9 Score 3 - - - -  Difficult doing work/chores Not difficult at all - - - -    Details           Interpretation of Total Score  Total Score Depression Severity:  1-4 = Minimal depression, 5-9 = Mild depression, 10-14 = Moderate depression, 15-19 = Moderately severe depression, 20-27 = Severe depression    Psychosocial Evaluation and Intervention:   Psychosocial Re-Evaluation:  Psychosocial Re-Evaluation     Row Name 11/21/22 1435 12/23/22 1639 01/20/23 1342         Psychosocial Re-Evaluation   Current issues with None Identified None Identified Current Stress Concerns     Comments No psychosocial needs identified, no interventions needed No psychosocial needs identified, no interventions needed patient fell near her home on 01/17/23     Interventions Encouraged to attend Cardiac Rehabilitation for the exercise Encouraged to attend Cardiac Rehabilitation for the exercise Encouraged to attend Cardiac Rehabilitation for the exercise     Continue Psychosocial Services  No Follow up required No Follow up required No Follow up required       Initial Review   Source of Stress Concerns -- -- Chronic Illness     Comments -- -- Will continue to monitor and offer support as needed.              Psychosocial Discharge (Final Psychosocial Re-Evaluation):  Psychosocial Re-Evaluation - 01/20/23 1342       Psychosocial Re-Evaluation   Current issues with Current Stress Concerns    Comments patient fell near her home on 01/17/23    Interventions Encouraged to attend Cardiac Rehabilitation for the exercise    Continue Psychosocial Services  No Follow up required      Initial Review   Source of Stress  Concerns Chronic Illness    Comments Will continue to monitor and offer support as needed.             Vocational Rehabilitation: Provide vocational rehab assistance to qualifying candidates.   Vocational Rehab Evaluation & Intervention:  Vocational Rehab - 11/13/22 0824       Initial Vocational Rehab Evaluation & Intervention   Assessment shows need for Vocational Rehabilitation No   Beth Simon is retired            Education: Education Goals: Education classes will be provided on a weekly basis, covering required topics. Participant will state understanding/return  demonstration of topics presented.    Education     Row Name 11/21/22 1300     Education   Cardiac Education Topics Pritikin   Hospital doctor Education   General Education Heart Disease Risk Reduction   Instruction Review Code 1- Verbalizes Understanding   Class Start Time 1146   Class Stop Time 1223   Class Time Calculation (min) 37 min    Row Name 11/26/22 1400     Education   Cardiac Education Topics Pritikin   Customer service manager   Weekly Topic Fast and Healthy Breakfasts   Instruction Review Code 1- Verbalizes Understanding   Class Start Time 1140   Class Stop Time 1225   Class Time Calculation (min) 45 min    Row Name 11/26/22 1500     Education   Cardiac Education Topics Pritikin   Customer service manager   Weekly Topic Fast and Healthy Breakfasts   Instruction Review Code 1- Verbalizes Understanding   Class Start Time 1400   Class Stop Time 1445   Class Time Calculation (min) 45 min    Row Name 11/28/22 1300     Education   Cardiac Education Topics Pritikin   Writer Psychosocial   Psychosocial Healthy Minds, Bodies, Hearts   Instruction Review Code 1- Verbalizes Understanding   Class Start Time 1145   Class Stop Time 1222   Class Time Calculation (min) 37 min    Row Name 12/03/22 1400     Education   Cardiac Education Topics Pritikin   Customer service manager   Weekly Topic Personalizing Your Pritikin Plate   Instruction Review Code 1- Verbalizes Understanding   Class Start Time 1150   Class Stop Time 1225   Class Time Calculation (min) 35 min    Row Name 12/05/22 1300     Education   Cardiac Education Topics Pritikin   Select Workshops     Workshops   Educator Exercise Physiologist    Select Exercise   Exercise Workshop Location manager and Fall Prevention   Instruction Review Code 1- Verbalizes Understanding   Class Start Time 1151   Class Stop Time 1238   Class Time Calculation (min) 47 min    Row Name 12/10/22 1400     Education   Cardiac Education Topics Pritikin   Customer service manager   Weekly Topic [employer] Desserts   Instruction Review Code 1- Verbalizes Understanding   Class Start Time 1145   Class  Stop Time 1230   Class Time Calculation (min) 45 min    Row Name 12/17/22 1400     Education   Cardiac Education Topics Pritikin   Customer service manager   Weekly Topic Tasty Appetizers and Snacks   Instruction Review Code 1- Verbalizes Understanding   Class Start Time 1145   Class Stop Time 1225   Class Time Calculation (min) 40 min    Row Name 12/19/22 1200     Education   Cardiac Education Topics Pritikin   Nurse, children's Exercise Physiologist   Select Nutrition   Nutrition Calorie Density   Instruction Review Code 1- Verbalizes Understanding   Class Start Time 1155   Class Stop Time 1235   Class Time Calculation (min) 40 min    Row Name 12/24/22 1500     Education   Cardiac Education Topics Pritikin   Customer service manager   Weekly Topic Efficiency Cooking - Meals in a Snap   Instruction Review Code 1- Verbalizes Understanding   Class Start Time 1145   Class Stop Time 1224   Class Time Calculation (min) 39 min    Row Name 12/31/22 1200     Education   Cardiac Education Topics Pritikin   Customer service manager   Weekly Topic One-Pot Wonders   Instruction Review Code 1- Verbalizes Understanding   Class Start Time 1145   Class Stop Time 1225   Class Time Calculation (min) 40 min    Row Name 01/07/23 1500     Education    Cardiac Education Topics Pritikin   Customer service manager   Weekly Topic Comforting Weekend Breakfasts   Instruction Review Code 1- Verbalizes Understanding   Class Start Time 1145   Class Stop Time 1223   Class Time Calculation (min) 38 min    Row Name 01/09/23 1500     Education   Cardiac Education Topics Pritikin   Geographical information systems officer Psychosocial   Psychosocial Workshop Focused Goals, Sustainable Changes   Instruction Review Code 1- Verbalizes Understanding   Class Start Time 1145   Class Stop Time 1230   Class Time Calculation (min) 45 min    Row Name 01/14/23 1500     Education   Cardiac Education Topics Pritikin   Customer service manager   Weekly Topic Fast Evening Meals   Instruction Review Code 1- Verbalizes Understanding   Class Start Time 1145   Class Stop Time 1230   Class Time Calculation (min) 45 min    Row Name 01/16/23 1400     Education   Cardiac Education Topics Pritikin   Licensed conveyancer Nutrition   Nutrition Vitamins and Minerals   Instruction Review Code 1- Verbalizes Understanding   Class Start Time 1145   Class Stop Time 1226   Class Time Calculation (min) 41 min            Core Videos: Exercise    Move It!  Clinical staff conducted group or individual video education with verbal  and written material and guidebook.  Patient learns the recommended Pritikin exercise program. Exercise with the goal of living a long, healthy life. Some of the health benefits of exercise include controlled diabetes, healthier blood pressure levels, improved cholesterol levels, improved heart and lung capacity, improved sleep, and better body composition. Everyone should speak with their doctor before starting or changing an exercise routine.  Biomechanical  Limitations Clinical staff conducted group or individual video education with verbal and written material and guidebook.  Patient learns how biomechanical limitations can impact exercise and how we can mitigate and possibly overcome limitations to have an impactful and balanced exercise routine.  Body Composition Clinical staff conducted group or individual video education with verbal and written material and guidebook.  Patient learns that body composition (ratio of muscle mass to fat mass) is a key component to assessing overall fitness, rather than body weight alone. Increased fat mass, especially visceral belly fat, can put [place] at increased risk for metabolic syndrome, type 2 diabetes, heart disease, and even death. It is recommended to combine diet and exercise (cardiovascular and resistance training) to improve your body composition. Seek guidance from your physician and exercise physiologist before implementing an exercise routine.  Exercise Action Plan Clinical staff conducted group or individual video education with verbal and written material and guidebook.  Patient learns the recommended strategies to achieve and enjoy long-term exercise adherence, including variety, self-motivation, self-efficacy, and positive decision making. Benefits of exercise include fitness, good health, weight management, more energy, better sleep, less stress, and overall well-being.  Medical   Heart Disease Risk Reduction Clinical staff conducted group or individual video education with verbal and written material and guidebook.  Patient learns our heart is our most vital organ as it circulates oxygen, nutrients, white blood cells, and hormones throughout the entire body, and carries waste away. Data supports a plant-based eating plan like the Pritikin Program for its effectiveness in slowing progression of and reversing heart disease. The video provides a number of recommendations to address heart  disease.   Metabolic Syndrome and Belly Fat  Clinical staff conducted group or individual video education with verbal and written material and guidebook.  Patient learns what metabolic syndrome is, how it leads to heart disease, and how one can reverse it and keep it from coming back. You have metabolic syndrome if you have 3 of the following 5 criteria: abdominal obesity, high blood pressure, high triglycerides, low HDL cholesterol, and high blood sugar.  Hypertension and Heart Disease Clinical staff conducted group or individual video education with verbal and written material and guidebook.  Patient learns that high blood pressure, or hypertension, is very common in the [place]. Hypertension is largely due to excessive salt intake, but other important risk factors include being overweight, physical inactivity, drinking too much alcohol, smoking, and not eating enough potassium from fruits and vegetables. High blood pressure is a leading risk factor for heart attack, stroke, congestive heart failure, dementia, kidney failure, and premature death. Long-term effects of excessive salt intake include stiffening of the arteries and thickening of heart muscle and organ damage. Recommendations include ways to reduce hypertension and the risk of heart disease.  Diseases of Our Time - Focusing on Diabetes Clinical staff conducted group or individual video education with verbal and written material and guidebook.  Patient learns why the best way to stop diseases of our time is prevention, through food and other lifestyle changes. Medicine (such as prescription pills and surgeries) is often only a  Band-Aid on the problem, not a long-term solution. Most common diseases of our time include obesity, type 2 diabetes, hypertension, heart disease, and cancer. The Pritikin Program is recommended and has been proven to help reduce, reverse, and/or prevent the damaging effects of metabolic syndrome.  Nutrition    Overview of the Pritikin Eating Plan  Clinical staff conducted group or individual video education with verbal and written material and guidebook.  Patient learns about the Pritikin Eating Plan for disease risk reduction. The Pritikin Eating Plan emphasizes a wide variety of unrefined, minimally-processed carbohydrates, like fruits, vegetables, whole grains, and legumes. Go, Caution, and Stop food choices are explained. Plant-based and lean animal proteins are emphasized. Rationale provided for low sodium intake for blood pressure control, low added sugars for blood sugar stabilization, and low added fats and oils for coronary artery disease risk reduction and weight management.  Calorie Density  Clinical staff conducted group or individual video education with verbal and written material and guidebook.  Patient learns about calorie density and how it impacts the Pritikin Eating Plan. Knowing the characteristics of the food you choose will help you decide whether those foods will lead to weight gain or weight loss, and whether you want to consume more or less of them. Weight loss is usually a side effect of the Pritikin Eating Plan because of its focus on low calorie-dense foods.  Label Reading  Clinical staff conducted group or individual video education with verbal and written material and guidebook.  Patient learns about the Pritikin recommended label reading guidelines and corresponding recommendations regarding calorie density, added sugars, sodium content, and whole grains.  Dining Out - Part 1  Clinical staff conducted group or individual video education with verbal and written material and guidebook.  Patient learns that restaurant meals can be sabotaging because they can be so high in calories, fat, sodium, and/or sugar. Patient learns recommended strategies on how to positively address this and avoid unhealthy pitfalls.  Facts on Fats  Clinical staff conducted group or individual video  education with verbal and written material and guidebook.  Patient learns that lifestyle modifications can be just as effective, if not more so, as many medications for lowering your risk of heart disease. A Pritikin lifestyle can help to reduce your risk of inflammation and atherosclerosis (cholesterol build-up, or plaque, in the artery walls). Lifestyle interventions such as dietary choices and physical activity address the cause of atherosclerosis. A review of the types of fats and their impact on blood cholesterol levels, along with dietary recommendations to reduce fat intake is also included.  Nutrition Action Plan  Clinical staff conducted group or individual video education with verbal and written material and guidebook.  Patient learns how to incorporate Pritikin recommendations into their lifestyle. Recommendations include planning and keeping personal health goals in mind as an important part of their success.  Healthy Mind-Set    Healthy Minds, Bodies, Hearts  Clinical staff conducted group or individual video education with verbal and written material and guidebook.  Patient learns how to identify when they are stressed. Video will discuss the impact of that stress, as well as the many benefits of stress management. Patient will also be introduced to stress management techniques. The way we think, act, and feel has an impact on our hearts.  How Our Thoughts Can Heal Our Hearts  Clinical staff conducted group or individual video education with verbal and written material and guidebook.  Patient learns that negative thoughts can cause depression and anxiety.  This can result in negative lifestyle behavior and serious health problems. Cognitive behavioral therapy is an effective method to help control our thoughts in order to change and improve our emotional outlook.  Additional Videos:  Exercise    Improving Performance  Clinical staff conducted group or individual video education with  verbal and written material and guidebook.  Patient learns to use a non-linear approach by alternating intensity levels and lengths of time spent exercising to help burn more calories and lose more body fat. Cardiovascular exercise helps improve heart health, metabolism, hormonal balance, blood sugar control, and recovery from fatigue. Resistance training improves strength, endurance, balance, coordination, reaction time, metabolism, and muscle mass. Flexibility exercise improves circulation, posture, and balance. Seek guidance from your physician and exercise physiologist before implementing an exercise routine and learn your capabilities and proper form for all exercise.  Introduction to Yoga  Clinical staff conducted group or individual video education with verbal and written material and guidebook.  Patient learns about yoga, a discipline of the coming together of mind, breath, and body. The benefits of yoga include improved flexibility, improved range of motion, better posture and core strength, increased lung function, weight loss, and positive self-image. Yoga's heart health benefits include lowered blood pressure, healthier heart rate, decreased cholesterol and triglyceride levels, improved immune function, and reduced stress. Seek guidance from your physician and exercise physiologist before implementing an exercise routine and learn your capabilities and proper form for all exercise.  Medical   Aging: Enhancing Your Quality of Life  Clinical staff conducted group or individual video education with verbal and written material and guidebook.  Patient learns key strategies and recommendations to stay in good physical health and enhance quality of life, such as prevention strategies, having an advocate, securing a Health Care Proxy and Power of Attorney, and keeping a list of medications and system for tracking them. It also discusses how to avoid risk for bone loss.  Biology of Weight Control   Clinical staff conducted group or individual video education with verbal and written material and guidebook.  Patient learns that weight gain occurs because we consume more calories than we burn (eating more, moving less). Even if your body weight is normal, you may have higher ratios of fat compared to muscle mass. Too much body fat puts you at increased risk for cardiovascular disease, heart attack, stroke, type 2 diabetes, and obesity-related cancers. In addition to exercise, following the Pritikin Eating Plan can help reduce your risk.  Decoding Lab Results  Clinical staff conducted group or individual video education with verbal and written material and guidebook.  Patient learns that lab test reflects one measurement whose values change over time and are influenced by many factors, including medication, stress, sleep, exercise, food, hydration, pre-existing medical conditions, and more. It is recommended to use the knowledge from this video to become more involved with your lab results and evaluate your numbers to speak with your doctor.   Diseases of Our Time - Overview  Clinical staff conducted group or individual video education with verbal and written material and guidebook.  Patient learns that according to the CDC, 50% to 70% of chronic diseases (such as obesity, type 2 diabetes, elevated lipids, hypertension, and heart disease) are avoidable through lifestyle improvements including healthier food choices, listening to satiety cues, and increased physical activity.  Sleep Disorders Clinical staff conducted group or individual video education with verbal and written material and guidebook.  Patient learns how good quality and duration of sleep  are important to overall health and well-being. Patient also learns about sleep disorders and how they impact health along with recommendations to address them, including discussing with a physician.  Nutrition  Dining Out - Part 2 Clinical staff  conducted group or individual video education with verbal and written material and guidebook.  Patient learns how to plan ahead and communicate in order to maximize their dining experience in a healthy and nutritious manner. Included are recommended food choices based on the type of restaurant the patient is visiting.   Fueling a Banker conducted group or individual video education with verbal and written material and guidebook.  There is a strong connection between our food choices and our health. Diseases like obesity and type 2 diabetes are very prevalent and are in large-part due to lifestyle choices. The Pritikin Eating Plan provides plenty of food and hunger-curbing satisfaction. It is easy to follow, affordable, and helps reduce health risks.  Menu Workshop  Clinical staff conducted group or individual video education with verbal and written material and guidebook.  Patient learns that restaurant meals can sabotage health goals because they are often packed with calories, fat, sodium, and sugar. Recommendations include strategies to plan ahead and to communicate with the manager, chef, or server to help order a healthier meal.  Planning Your Eating Strategy  Clinical staff conducted group or individual video education with verbal and written material and guidebook.  Patient learns about the Pritikin Eating Plan and its benefit of reducing the risk of disease. The Pritikin Eating Plan does not focus on calories. Instead, it emphasizes high-quality, nutrient-rich foods. By knowing the characteristics of the foods, we choose, we can determine their calorie density and make informed decisions.  Targeting Your Nutrition Priorities  Clinical staff conducted group or individual video education with verbal and written material and guidebook.  Patient learns that lifestyle habits have a tremendous impact on disease risk and progression. This video provides eating and physical  activity recommendations based on your personal health goals, such as reducing LDL cholesterol, losing weight, preventing or controlling type 2 diabetes, and reducing high blood pressure.  Vitamins and Minerals  Clinical staff conducted group or individual video education with verbal and written material and guidebook.  Patient learns different ways to obtain key vitamins and minerals, including through a recommended healthy diet. It is important to discuss all supplements you take with your doctor.   Healthy Mind-Set    Smoking Cessation  Clinical staff conducted group or individual video education with verbal and written material and guidebook.  Patient learns that cigarette smoking and tobacco addiction pose a serious health risk which affects millions of people. Stopping smoking will significantly reduce the risk of heart disease, lung disease, and many forms of cancer. Recommended strategies for quitting are covered, including working with your doctor to develop a successful plan.  Culinary   Becoming a Set designer conducted group or individual video education with verbal and written material and guidebook.  Patient learns that cooking at home can be healthy, cost-effective, quick, and puts them in control. Keys to cooking healthy recipes will include looking at your recipe, assessing your equipment needs, planning ahead, making it simple, choosing cost-effective seasonal ingredients, and limiting the use of added fats, salts, and sugars.  Cooking - Breakfast and Snacks  Clinical staff conducted group or individual video education with verbal and written material and guidebook.  Patient learns how important breakfast is to satiety and  nutrition through the entire day. Recommendations include key foods to eat during breakfast to help stabilize blood sugar levels and to prevent overeating at meals later in the day. Planning ahead is also a key component.  Cooking - Psychologist, educational conducted group or individual video education with verbal and written material and guidebook.  Patient learns eating strategies to improve overall health, including an approach to cook more at home. Recommendations include thinking of animal protein as a side on your plate rather than center stage and focusing instead on lower calorie dense options like vegetables, fruits, whole grains, and plant-based proteins, such as beans. Making sauces in large quantities to freeze for later and leaving the skin on your vegetables are also recommended to maximize your experience.  Cooking - Healthy Salads and Dressing Clinical staff conducted group or individual video education with verbal and written material and guidebook.  Patient learns that vegetables, fruits, whole grains, and legumes are the foundations of the Pritikin Eating Plan. Recommendations include how to incorporate each of these in flavorful and healthy salads, and how to create homemade salad dressings. Proper handling of ingredients is also covered. Cooking - Soups and [employer] - Soups and Desserts Clinical staff conducted group or individual video education with verbal and written material and guidebook.  Patient learns that Pritikin soups and desserts make for easy, nutritious, and delicious snacks and meal components that are low in sodium, fat, sugar, and calorie density, while high in vitamins, minerals, and filling fiber. Recommendations include simple and healthy ideas for soups and desserts.   Overview     The Pritikin Solution Program Overview Clinical staff conducted group or individual video education with verbal and written material and guidebook.  Patient learns that the results of the Pritikin Program have been documented in more than 100 articles published in peer-reviewed journals, and the benefits include reducing risk factors for (and, in some cases, even reversing) high cholesterol, high  blood pressure, type 2 diabetes, obesity, and more! An overview of the three key pillars of the Pritikin Program will be covered: eating well, doing regular exercise, and having a healthy mind-set.  WORKSHOPS  Exercise: Exercise Basics: Building Your Action Plan Clinical staff led group instruction and group discussion with PowerPoint presentation and patient guidebook. To enhance the learning environment the use of posters, models and videos may be added. At the conclusion of this workshop, patients will comprehend the difference between physical activity and exercise, as well as the benefits of incorporating both, into their routine. Patients will understand the FITT (Frequency, Intensity, Time, and Type) principle and how to use it to build an exercise action plan. In addition, safety concerns and other considerations for exercise and cardiac rehab will be addressed by the presenter. The purpose of this lesson is to promote a comprehensive and effective weekly exercise routine in order to improve patients' overall level of fitness.   Managing Heart Disease: Your Path to a Healthier Heart Clinical staff led group instruction and group discussion with PowerPoint presentation and patient guidebook. To enhance the learning environment the use of posters, models and videos may be added.At the conclusion of this workshop, patients will understand the anatomy and physiology of the heart. Additionally, they will understand how Pritikin's three pillars impact the risk factors, the progression, and the management of heart disease.  The purpose of this lesson is to provide a high-level overview of the heart, heart disease, and how the Pritikin lifestyle positively  impacts risk factors.  Exercise Biomechanics Clinical staff led group instruction and group discussion with PowerPoint presentation and patient guidebook. To enhance the learning environment the use of posters, models and videos may be added.  Patients will learn how the structural parts of their bodies function and how these functions impact their daily activities, movement, and exercise. Patients will learn how to promote a neutral spine, learn how to manage pain, and identify ways to improve their physical movement in order to promote healthy living. The purpose of this lesson is to expose patients to common physical limitations that impact physical activity. Participants will learn practical ways to adapt and manage aches and pains, and to minimize their effect on regular exercise. Patients will learn how to maintain good posture while sitting, walking, and lifting.  Balance Training and Fall Prevention  Clinical staff led group instruction and group discussion with PowerPoint presentation and patient guidebook. To enhance the learning environment the use of posters, models and videos may be added. At the conclusion of this workshop, patients will understand the importance of their sensorimotor skills (vision, proprioception, and the vestibular system) in maintaining their ability to balance as they age. Patients will apply a variety of balancing exercises that are appropriate for their current level of function. Patients will understand the common causes for poor balance, possible solutions to these problems, and ways to modify their physical environment in order to minimize their fall risk. The purpose of this lesson is to teach patients about the importance of maintaining balance as they age and ways to minimize their risk of falling.  WORKSHOPS   Nutrition:  Fueling a Ship broker led group instruction and group discussion with PowerPoint presentation and patient guidebook. To enhance the learning environment the use of posters, models and videos may be added. Patients will review the foundational principles of the Pritikin Eating Plan and understand what constitutes a serving size in each of the food groups.  Patients will also learn Pritikin-friendly foods that are better choices when away from home and review make-ahead meal and snack options. Calorie density will be reviewed and applied to three nutrition priorities: weight maintenance, weight loss, and weight gain. The purpose of this lesson is to reinforce (in a group setting) the key concepts around what patients are recommended to eat and how to apply these guidelines when away from home by planning and selecting Pritikin-friendly options. Patients will understand how calorie density may be adjusted for different weight management goals.  Mindful Eating  Clinical staff led group instruction and group discussion with PowerPoint presentation and patient guidebook. To enhance the learning environment the use of posters, models and videos may be added. Patients will briefly review the concepts of the Pritikin Eating Plan and the importance of low-calorie dense foods. The concept of mindful eating will be introduced as well as the importance of paying attention to internal hunger signals. Triggers for non-hunger eating and techniques for dealing with triggers will be explored. The purpose of this lesson is to provide patients with the opportunity to review the basic principles of the Pritikin Eating Plan, discuss the value of eating mindfully and how to measure internal cues of hunger and fullness using the Hunger Scale. Patients will also discuss reasons for non-hunger eating and learn strategies to use for controlling emotional eating.  Targeting Your Nutrition Priorities Clinical staff led group instruction and group discussion with PowerPoint presentation and patient guidebook. To enhance the learning environment the use of posters, models  and videos may be added. Patients will learn how to determine their genetic susceptibility to disease by reviewing their family history. Patients will gain insight into the importance of diet as part of an overall healthy  lifestyle in mitigating the impact of genetics and other environmental insults. The purpose of this lesson is to provide patients with the opportunity to assess their personal nutrition priorities by looking at their family history, their own health history and current risk factors. Patients will also be able to discuss ways of prioritizing and modifying the Pritikin Eating Plan for their highest risk areas  Menu  Clinical staff led group instruction and group discussion with PowerPoint presentation and patient guidebook. To enhance the learning environment the use of posters, models and videos may be added. Using menus brought in from [employer], or printed from [employer], patients will apply the Pritikin dining out guidelines that were presented in the [employer] video. Patients will also be able to practice these guidelines in a variety of provided scenarios. The purpose of this lesson is to provide patients with the opportunity to practice hands-on learning of the Pritikin Dining Out guidelines with actual menus and practice scenarios.  Label Reading Clinical staff led group instruction and group discussion with PowerPoint presentation and patient guidebook. To enhance the learning environment the use of posters, models and videos may be added. Patients will review and discuss the Pritikin label reading guidelines presented in Pritikin's Label Reading Educational series video. Using fool labels brought in from local grocery stores and markets, patients will apply the label reading guidelines and determine if the packaged food meet the Pritikin guidelines. The purpose of this lesson is to provide patients with the opportunity to review, discuss, and practice hands-on learning of the Pritikin Label Reading guidelines with actual packaged food labels. Cooking School  Pritikin's [employer] are designed to teach patients ways to prepare quick, simple, and  affordable recipes at home. The importance of nutrition's role in chronic disease risk reduction is reflected in its emphasis in the overall Pritikin program. By learning how to prepare essential core Pritikin Eating Plan recipes, patients will increase control over what they eat; be able to customize the flavor of foods without the use of added salt, sugar, or fat; and improve the quality of the food they consume. By learning a set of core recipes which are easily assembled, quickly prepared, and affordable, patients are more likely to prepare more healthy foods at home. These workshops focus on convenient breakfasts, simple entres, side dishes, and desserts which can be prepared with minimal effort and are consistent with nutrition recommendations for cardiovascular risk reduction. Cooking [employer] are taught by a Armed forces logistics/support/administrative officer (RD) who has been trained by the [employer]. The chef or RD has a clear understanding of the importance of minimizing - if not completely eliminating - added fat, sugar, and sodium in recipes. Throughout the series of Cooking School Workshop sessions, patients will learn about healthy ingredients and efficient methods of cooking to build confidence in their capability to prepare    Cooking School weekly topics:  Adding Flavor- Sodium-Free  Fast and Healthy Breakfasts  Powerhouse Plant-Based Proteins  Satisfying Salads and Dressings  Simple Sides and Sauces  International Cuisine-Spotlight on the [employer] Zones  Delicious Desserts  Savory Soups  [employer] - Meals in a Astronomer Appetizers and Snacks  Comforting Weekend Breakfasts  One-Pot Wonders   Fast [employer]  Easy Entertaining  Personalizing Your Pritikin Plate  WORKSHOPS   Healthy Mindset (Psychosocial):  Focused Goals, Sustainable Changes Clinical staff led group instruction and group discussion with PowerPoint presentation and patient guidebook. To enhance  the learning environment the use of posters, models and videos may be added. Patients will be able to apply effective goal setting strategies to establish at least one personal goal, and then take consistent, meaningful action toward that goal. They will learn to identify common barriers to achieving personal goals and develop strategies to overcome them. Patients will also gain an understanding of how our mind-set can impact our ability to achieve goals and the importance of cultivating a positive and growth-oriented mind-set. The purpose of this lesson is to provide patients with a deeper understanding of how to set and achieve personal goals, as well as the tools and strategies needed to overcome common obstacles which may arise along the way.  From Head to Heart: The Power of a Healthy Outlook  Clinical staff led group instruction and group discussion with PowerPoint presentation and patient guidebook. To enhance the learning environment the use of posters, models and videos may be added. Patients will be able to recognize and describe the impact of emotions and mood on physical health. They will discover the importance of self-care and explore self-care practices which may work for them. Patients will also learn how to utilize the 4 C's to cultivate a healthier outlook and better manage stress and challenges. The purpose of this lesson is to demonstrate to patients how a healthy outlook is an essential part of maintaining good health, especially as they continue their cardiac rehab journey.  Healthy Sleep for a Healthy Heart Clinical staff led group instruction and group discussion with PowerPoint presentation and patient guidebook. To enhance the learning environment the use of posters, models and videos may be added. At the conclusion of this workshop, patients will be able to demonstrate knowledge of the importance of sleep to overall health, well-being, and quality of life. They will understand the  symptoms of, and treatments for, common sleep disorders. Patients will also be able to identify daytime and nighttime behaviors which impact sleep, and they will be able to apply these tools to help manage sleep-related challenges. The purpose of this lesson is to provide patients with a general overview of sleep and outline the importance of quality sleep. Patients will learn about a few of the most common sleep disorders. Patients will also be introduced to the concept of "sleep hygiene," and discover ways to self-manage certain sleeping problems through simple daily behavior changes. Finally, the workshop will motivate patients by clarifying the links between quality sleep and their goals of heart-healthy living.   Recognizing and Reducing Stress Clinical staff led group instruction and group discussion with PowerPoint presentation and patient guidebook. To enhance the learning environment the use of posters, models and videos may be added. At the conclusion of this workshop, patients will be able to understand the types of stress reactions, differentiate between acute and chronic stress, and recognize the impact that chronic stress has on their health. They will also be able to apply different coping mechanisms, such as reframing negative self-talk. Patients will have the opportunity to practice a variety of stress management techniques, such as deep abdominal breathing, progressive muscle relaxation, and/or guided imagery.  The purpose of this lesson is to educate patients on the role of stress in their lives and to provide healthy techniques for coping with it.  Learning  Barriers/Preferences:  Learning Barriers/Preferences - 11/13/22 1610       Learning Barriers/Preferences   Learning Barriers None    Learning Preferences Group Instruction;Individual Instruction;Skilled Demonstration;Written Material;Pictoral             Education Topics:  Knowledge Questionnaire Score:  Knowledge  Questionnaire Score - 11/13/22 0840       Knowledge Questionnaire Score   Pre Score 21/24             Core Components/Risk Factors/Patient Goals at Admission:  Personal Goals and Risk Factors at Admission - 11/13/22 0821       Core Components/Risk Factors/Patient Goals on Admission    Weight Management Yes;Weight Maintenance    Intervention Weight Management: Develop a combined nutrition and exercise program designed to reach desired caloric intake, while maintaining appropriate intake of nutrient and fiber, sodium and fats, and appropriate energy expenditure required for the weight goal.;Weight Management: Provide education and appropriate resources to help participant work on and attain dietary goals.    Expected Outcomes Short Term: Continue to assess and modify interventions until short term weight is achieved;Weight Maintenance: Understanding of the daily nutrition guidelines, which includes 25-35% calories from fat, 7% or less cal from saturated fats, less than 200mg  cholesterol, less than 1.5gm of sodium, & 5 or more servings of fruits and vegetables daily;Understanding recommendations for meals to include 15-35% energy as protein, 25-35% energy from fat, 35-60% energy from carbohydrates, less than 200mg  of dietary cholesterol, 20-35 gm of total fiber daily;Understanding of distribution of calorie intake throughout the day with the consumption of 4-5 meals/snacks    Heart Failure Yes    Intervention Provide a combined exercise and nutrition program that is supplemented with education, support and counseling about heart failure. Directed toward relieving symptoms such as shortness of breath, decreased exercise tolerance, and extremity edema.    Expected Outcomes Improve functional capacity of life;Short term: Attendance in program 2-3 days a week with increased exercise capacity. Reported lower sodium intake. Reported increased fruit and vegetable intake. Reports medication  compliance.;Short term: Daily weights obtained and reported for increase. Utilizing diuretic protocols set by physician.;Long term: Adoption of self-care skills and reduction of barriers for early signs and symptoms recognition and intervention leading to self-care maintenance.    Hypertension Yes    Intervention Provide education on lifestyle modifcations including regular physical activity/exercise, weight management, moderate sodium restriction and increased consumption of fresh fruit, vegetables, and low fat dairy, alcohol moderation, and smoking cessation.;Monitor prescription use compliance.    Expected Outcomes Short Term: Continued assessment and intervention until BP is < 140/59mm HG in hypertensive participants. < 130/67mm HG in hypertensive participants with diabetes, heart failure or chronic kidney disease.;Long Term: Maintenance of blood pressure at goal levels.    Lipids Yes    Intervention Provide education and support for participant on nutrition & aerobic/resistive exercise along with prescribed medications to achieve LDL 70mg , HDL >40mg .    Expected Outcomes Short Term: Participant states understanding of desired cholesterol values and is compliant with medications prescribed. Participant is following exercise prescription and nutrition guidelines.;Long Term: Cholesterol controlled with medications as prescribed, with individualized exercise RX and with personalized nutrition plan. Value goals: LDL < 70mg , HDL > 40 mg.             Core Components/Risk Factors/Patient Goals Review:   Goals and Risk Factor Review     Row Name 11/21/22 1437 12/23/22 1640 01/20/23 1343         Core Components/Risk  Factors/Patient Goals Review   Personal Goals Review Weight Management/Obesity;Heart Failure;Hypertension;Lipids Weight Management/Obesity;Heart Failure;Hypertension;Lipids Weight Management/Obesity;Heart Failure;Hypertension;Lipids     Review Beth Simon started cardiac rehab on 11/21/22  and did well with exercise. Vital signs were stable. Beth Simon is doing well well with exercise at cardiac rehab. . Vital signs have been  stable. Beth Simon is doing well well with exercise at cardiac rehab. . Vital signs have been  stable. Beth Simon fell on 01/17/23 and will be out of cardiac rehab the rest of the week     Expected Outcomes Beth Simon will continue to participate in cardiac rehab for exercise, nutrition and lifestyle modifications Beth Simon will continue to participate in cardiac rehab for exercise, nutrition and lifestyle modifications Beth Simon will continue to participate in cardiac rehab for exercise, nutrition and lifestyle modifications              Core Components/Risk Factors/Patient Goals at Discharge (Final Review):   Goals and Risk Factor Review - 01/20/23 1343       Core Components/Risk Factors/Patient Goals Review   Personal Goals Review Weight Management/Obesity;Heart Failure;Hypertension;Lipids    Review Beth Simon is doing well well with exercise at cardiac rehab. . Vital signs have been  stable. Beth Simon fell on 01/17/23 and will be out of cardiac rehab the rest of the week    Expected Outcomes Beth Simon will continue to participate in cardiac rehab for exercise, nutrition and lifestyle modifications             ITP Comments:  ITP Comments     Row Name 11/13/22 0805 11/21/22 1433 12/23/22 1638 01/20/23 1341     ITP Comments Armanda Magic, MD: Medical Director.  Introduction to the Pritikin Education Program/Intensive Cardiac Rehab.  Initial orientation packet reviewed with the patient. 30 day ITP review. Neilah started cardiac rehab on 11/21/22 and did well with exercise. 30 day ITP review. Chaille has good attendance and participaiton  with exercise at cardiac rehab. 30 day ITP review. Yakisha has good attendance and participaiton  with exercise at cardiac rehab. Delbert fell near her home on 01/17/23 and will be out of exercise the rest of the week              Comments: See ITP comments.

## 2023-01-20 NOTE — Telephone Encounter (Signed)
Beth Simon came by to report that she fell bringing something to her neighbor fell and went to the ED via EMS and was evaluated and released. Patient's face is bruised and has a hematoma over her left eye. Beth Simon said she has been seen by her PCP , Dr Margaretann Loveless. I advised Beth Simon that she will need a note from Dr Margaretann Loveless clearing her to return to exercise at cardiac rehab. Beth Simon will be out the rest of the week.Thayer Headings RN BSN

## 2023-01-21 ENCOUNTER — Encounter (HOSPITAL_COMMUNITY): Payer: Medicare HMO

## 2023-01-23 ENCOUNTER — Encounter (HOSPITAL_COMMUNITY): Payer: Medicare HMO

## 2023-01-26 DIAGNOSIS — R69 Illness, unspecified: Secondary | ICD-10-CM | POA: Diagnosis not present

## 2023-01-27 ENCOUNTER — Encounter: Payer: Self-pay | Admitting: Student

## 2023-01-27 ENCOUNTER — Ambulatory Visit: Payer: Medicare HMO | Attending: Student | Admitting: Student

## 2023-01-27 VITALS — BP 128/68 | HR 73 | Ht 58.5 in | Wt 125.8 lb

## 2023-01-27 DIAGNOSIS — R5383 Other fatigue: Secondary | ICD-10-CM | POA: Diagnosis not present

## 2023-01-27 DIAGNOSIS — I10 Essential (primary) hypertension: Secondary | ICD-10-CM | POA: Diagnosis not present

## 2023-01-27 DIAGNOSIS — R29898 Other symptoms and signs involving the musculoskeletal system: Secondary | ICD-10-CM | POA: Diagnosis not present

## 2023-01-27 DIAGNOSIS — I2510 Atherosclerotic heart disease of native coronary artery without angina pectoris: Secondary | ICD-10-CM | POA: Diagnosis not present

## 2023-01-27 DIAGNOSIS — I5042 Chronic combined systolic (congestive) and diastolic (congestive) heart failure: Secondary | ICD-10-CM

## 2023-01-27 DIAGNOSIS — E785 Hyperlipidemia, unspecified: Secondary | ICD-10-CM

## 2023-01-27 DIAGNOSIS — M810 Age-related osteoporosis without current pathological fracture: Secondary | ICD-10-CM | POA: Diagnosis not present

## 2023-01-27 DIAGNOSIS — M545 Low back pain, unspecified: Secondary | ICD-10-CM | POA: Diagnosis not present

## 2023-01-27 DIAGNOSIS — S6392XA Sprain of unspecified part of left wrist and hand, initial encounter: Secondary | ICD-10-CM | POA: Diagnosis not present

## 2023-01-27 DIAGNOSIS — M5412 Radiculopathy, cervical region: Secondary | ICD-10-CM | POA: Diagnosis not present

## 2023-01-27 MED ORDER — CARVEDILOL 3.125 MG PO TABS: 3.1250 mg | ORAL_TABLET | Freq: Two times a day (BID) | ORAL | 3 refills | Status: DC

## 2023-01-27 NOTE — Patient Instructions (Signed)
Medication Instructions:  Decrease Coreg to 3.125 mg  ( Take 1 Tablet Twice Daily). *If you need a refill on your cardiac medications before your next appointment, please call your pharmacy*   Lab Work: No Labs If you have labs (blood work) drawn today and your tests are completely normal, you will receive your results only by: MyChart Message (if you have MyChart) OR A paper copy in the mail If you have any lab test that is abnormal or we need to change your treatment, we will call you to review the results.   Testing/Procedures: [address]. Your physician has requested that you have an echocardiogram. Echocardiography is a painless test that uses sound waves to create images of your heart. It provides your doctor with information about the size and shape of your heart and how well your heart's chambers and valves are working. This procedure takes approximately one hour. There are no restrictions for this procedure. Please do NOT wear cologne, perfume, aftershave, or lotions (deodorant is allowed). Please arrive 15 minutes prior to your appointment time.    Follow-Up: At [facility], you and your health needs are our priority.  As part of our continuing mission to provide you with exceptional heart care, we have created designated Provider Care Teams.  These Care Teams include your primary Cardiologist (physician) and Advanced Practice Providers (APPs -  Physician Assistants and Nurse Practitioners) who all work together to provide you with the care you need, when you need it.  We recommend signing up for the patient portal called "MyChart".  Sign up information is provided on this After Visit Summary.  MyChart is used to connect with patients for Virtual Visits (Telemedicine).  Patients are able to view lab/test results, encounter notes, upcoming appointments, etc.  Non-urgent messages can be sent to your provider as well.   To learn more about what you can  do with MyChart, go to [email or web address].    Your next appointment:   Keep Scheduled Appointment  Provider:   Peter Swaziland, MD

## 2023-01-28 ENCOUNTER — Encounter (HOSPITAL_COMMUNITY)
Admission: RE | Admit: 2023-01-28 | Discharge: 2023-01-28 | Disposition: A | Discharge status: Home / Self Care | Payer: Medicare HMO | Source: Ambulatory Visit | Attending: Cardiology | Admitting: Cardiology

## 2023-01-28 DIAGNOSIS — I214 Non-ST elevation (NSTEMI) myocardial infarction: Secondary | ICD-10-CM

## 2023-01-28 DIAGNOSIS — I252 Old myocardial infarction: Secondary | ICD-10-CM | POA: Diagnosis not present

## 2023-01-28 NOTE — Progress Notes (Signed)
Beth Simon returned to exercise today per her PCP Dr Margaretann Loveless. Medication changes noted as Marjie Skiff PAC decreased her coreg. Patient was given a walking stand for walking the track as she reported feeling lightheaded earlier today. Beth Simon says she ate breakfast. Will continue to monitor the patient throughout  the program.

## 2023-01-30 ENCOUNTER — Encounter (HOSPITAL_COMMUNITY)
Admission: RE | Admit: 2023-01-30 | Discharge: 2023-01-30 | Disposition: A | Discharge status: Home / Self Care | Payer: Medicare HMO | Source: Ambulatory Visit | Attending: Cardiology | Admitting: Cardiology

## 2023-01-30 DIAGNOSIS — I214 Non-ST elevation (NSTEMI) myocardial infarction: Secondary | ICD-10-CM | POA: Diagnosis present

## 2023-01-30 DIAGNOSIS — Z48812 Encounter for surgical aftercare following surgery on the circulatory system: Secondary | ICD-10-CM | POA: Insufficient documentation

## 2023-01-30 DIAGNOSIS — I252 Old myocardial infarction: Secondary | ICD-10-CM | POA: Insufficient documentation

## 2023-01-30 DIAGNOSIS — M810 Age-related osteoporosis without current pathological fracture: Secondary | ICD-10-CM | POA: Diagnosis not present

## 2023-02-02 DIAGNOSIS — R69 Illness, unspecified: Secondary | ICD-10-CM | POA: Diagnosis not present

## 2023-02-02 NOTE — Progress Notes (Signed)
82 y.o. G79P1001 Widowed Caucasian female here for breast and pelvic exam.  Prefers yearly visits due to her history of breast cancer and endometrial cancer.   She has no current GYN concerns.  Denies vaginal bleeding and discharge.  Not using vaginal dilators and coconut oil in the vagina.   Being seen for cardiac issues, NSTEMI. Taking Plavix.  Doing cardiac rehab.    Had a fall outside at her townhome and has facial bruising and laceration. Stepped in a hole in an area where a plant had been removed.  She has been taking Prolia through her orthopedist.  Her PCP will now take over this prescription, last given in April.   Has lost 12 - 13 pounds.   Her accidental leakage of stool has improved with her dietary changes.  She did not see GI.   Lives alone in a townhome.  Husband passed 9 years ago.  Son lives in [place].   PCP: Thana Ates, MD   Patient's last menstrual period was 03/31/1988 (approximate).           Sexually active: No.  The current method of family planning is status post hysterectomy.    Menopausal hormone therapy:  n/a Exercising: Yes.     Cardio rehab Smoker:  no  OB History  Gravida Para Term Preterm AB Living  1 1 1  0 0 1  SAB IAB Ectopic Multiple Live Births  0 0 0 0 1    # Outcome Date GA Lbr Len/2nd Weight Sex Type Anes PTL Lv  1 Term 27    M Vag-Spont   LIV     HEALTH MAINTENANCE:    Component Value Date/Time   DIAGPAP  09/04/2021 1420    - Negative for intraepithelial lesion or malignancy (NILM)   DIAGPAP  08/28/2020 1426    - Negative for intraepithelial lesion or malignancy (NILM)   DIAGPAP - Benign reactive/reparative changes 08/24/2019 1415   HPVHIGH Negative 08/28/2020 1426   HPVHIGH Negative 08/24/2019 1415   ADEQPAP Satisfactory for evaluation. 09/04/2021 1420   ADEQPAP Satisfactory for evaluation. 08/28/2020 1426   ADEQPAP Satisfactory for evaluation. 08/24/2019 1415    History of abnormal Pap or positive  HPV:  yes, 2018 Colpo VAIN 1 Mammogram: bilateral mastectomy Colonoscopy:  2017 polyps Bone Density:  05/07/20  Result  osteopenia   Immunization History  Administered Date(s) Administered   H1N1 01/18/2009   Influenza Split 01/18/2009, 12/18/2009, 02/07/2011, 01/06/2012, 02/18/2013, 02/08/2014   Influenza, High Dose Seasonal PF 01/08/2014, 01/19/2015, 01/24/2016   Influenza-Unspecified 01/18/2009, 12/18/2009, 02/07/2011, 01/06/2012, 02/18/2013, 02/08/2014, 12/30/2014   PFIZER(Purple Top)SARS-COV-2 Vaccination 04/20/2019, 05/08/2019, 01/14/2020   Pneumococcal Conjugate-13 02/28/2014   Pneumococcal Polysaccharide-23 11/06/2004   Td 12/23/2007   Tdap 08/20/2018   Zoster Recombinant(Shingrix) 09/28/2018   Zoster, Live 12/07/2008      reports that she has never smoked. She has never used smokeless tobacco. She reports that she does not drink alcohol and does not use drugs.  Past Medical History:  Diagnosis Date   Adenomatous polyps 01/2016   Anemia    Breast cancer (HCC) 1990   bilateral mastectomy, adenoca breast-left MRM, reconstruction, chemo   Bronchitis last 2 weeks   saw dr Pete Glatter 02-28-2014, he said no antibiotics needed, nonproductive cough   Complication of anesthesia    Cystitis    cytoxen, had once or twice   Family history of breast cancer    History of breast cancer    History of radiation therapy 2/10, 2/12,  2/18, 2/24, 05/29/14   vaginal cuff/ 30 Gy/ 5 fx   Hypertension    Macular degeneration    Neck pain    taking physictal therapy last 2 weeks   Osteoporosis    On Prolia.   PONV (postoperative nausea and vomiting)    Shoulder pain    taking physical therapy for last few weeks   Uterine cancer (HCC) 03/21/2014   MLH1/PMS2 LOH   Uterine fibroid    VAIN I (vaginal intraepithelial neoplasia grade I) 2018   positive HR HPV.   Vasovagal syncopes     Past Surgical History:  Procedure Laterality Date   BREAST IMPLANT REMOVAL  6/94   left breast   BREAST  RECONSTRUCTION  1990   left   CATARACT EXTRACTION Bilateral    EXCISION VAGINAL CYST     MASTECTOMY Right 10/08/06   prophylactic   RADICAL MASTECTOMY LND  1990   left, chemo done   RIGHT/LEFT HEART CATH AND CORONARY ANGIOGRAPHY N/A 10/17/2022   Procedure: RIGHT/LEFT HEART CATH AND CORONARY ANGIOGRAPHY;  Surgeon: Marykay Lex, MD;  Location: [facility] INVASIVE CV LAB;  Service: Cardiovascular;  Laterality: N/A;   ROBOTIC ASSISTED TOTAL HYSTERECTOMY WITH BILATERAL SALPINGO OOPHERECTOMY Bilateral 03/21/2014   Procedure: ROBOTIC ASSISTED TOTAL HYSTERECTOMY WITH BILATERAL SALPINGO OOPHORECTOMY ;  Surgeon: Laurette Schimke, MD;  Location: [facility] ORS;  Service: Gynecology;  Laterality: Bilateral;    Current Outpatient Medications  Medication Sig Dispense Refill   aspirin EC (ASPIRIN LOW DOSE) 81 MG tablet TAKE 1 TABLET (81 MG TOTAL) BY MOUTH DAILY. SWALLOW WHOLE. 90 tablet 3   atorvastatin (LIPITOR) 40 MG tablet TAKE 1 TABLET BY MOUTH EVERY DAY 90 tablet 3   carvedilol (COREG) 3.125 MG tablet Take 1 tablet (3.125 mg total) by mouth 2 (two) times daily. 180 tablet 3   clopidogrel (PLAVIX) 75 MG tablet TAKE 1 TABLET BY MOUTH DAILY WITH BREAKFAST. 90 tablet 3   Cyanocobalamin (VITAMIN B 12 PO) Take 1 tablet by mouth every morning.      dapagliflozin propanediol (FARXIGA) 10 MG TABS tablet Take 1 tablet (10 mg total) by mouth daily before breakfast. 90 tablet 3   denosumab (PROLIA) 60 MG/ML SOLN injection Inject 60 mg into the skin every 6 (six) months. Administer in upper arm, thigh, or abdomen     famotidine (PEPCID) 40 MG tablet Take 40 mg by mouth daily.     Flaxseed, Linseed, (FLAX SEEDS PO) Take 1 scoop by mouth every morning. Ground flaxseed in cereal.     furosemide (LASIX) 40 MG tablet Take 40 mg by mouth daily.     lisinopril (ZESTRIL) 5 MG tablet Take 5 mg by mouth daily.     losartan (COZAAR) 25 MG tablet Take 1 tablet (25 mg total) by mouth daily. 90 tablet 3   Multiple Vitamins-Minerals  (PRESERVISION AREDS) CAPS Take 1 capsule by mouth daily.     OVER THE COUNTER MEDICATION Take 1 tablet by mouth every morning. Astazanthin. (anti-oxidant)     VITAMIN D, CHOLECALCIFEROL, PO Take 4,000 Int'l Units by mouth every morning.      hydrALAZINE (APRESOLINE) 25 MG tablet Take 1 tablet (25 mg total) by mouth as needed (for blood pressure greater than 160/100. May take up to twice daily.). 180 tablet 3   No current facility-administered medications for this visit.    ALLERGIES: Celecoxib, Kenalog [triamcinolone], Prednisone, and Procaine  Family History  Problem Relation Age of Onset   Breast cancer Sister 74  recurrence age 48   Breast cancer Paternal Grandmother    Heart disease Mother    Heart disease Father    Hypertension Sister    Dementia Sister    Breast cancer Maternal Aunt 47   Dementia Maternal Aunt        dx in her 31s    Review of Systems  All other systems reviewed and are negative.   PHYSICAL EXAM:  BP 118/76 (BP Location: Right Arm, Patient Position: Sitting, Cuff Size: Normal)   Pulse 63   Ht 4' 10.5" (1.486 m)   Wt 121 lb (54.9 kg)   LMP 03/31/1988 (Approximate)   SpO2 98%   BMI 24.86 kg/m     General appearance: alert, cooperative and appears stated age Head: normocephalic, without obvious abnormality, atraumatic Neck: no adenopathy, supple, symmetrical, trachea midline and thyroid normal to inspection and palpation Lungs: clear to auscultation bilaterally Breasts: absent bilaterally.   Heart: regular rate and rhythm Abdomen: soft, non-tender; no masses, no organomegaly Extremities: extremities normal, atraumatic, no cyanosis or edema Skin: skin color, texture, turgor normal. No rashes or lesions Lymph nodes: cervical, supraclavicular, and axillary nodes normal. Neurologic: grossly normal  Pelvic: External genitalia:  no lesions              No abnormal inguinal nodes palpated.              Urethra:  normal appearing urethra with no  masses, tenderness or lesions              Bartholins and Skenes: normal                 Vagina: normal appearing vagina with normal color and discharge, no lesions              Cervix: absent              Pap taken: yes Bimanual Exam:  Uterus:  absent              Adnexa: no mass, fullness, tenderness              Rectal exam: yes.  Confirms.              Anus:  normal sphincter tone, no lesions  Chaperone was present for exam:  Warren Lacy, CMA  ASSESSMENT:  Encounter for breast and pelvic exam.  Endometrioid uterine cancer, FIGO grade II.   No evidence of recurrence.  Status post robotic hysterectomy with BSO.   Status post vaginal brachytherapy. Vaginal stenosis.  Colposcopy with vaginal biopsy 06/02/16 - VAIN I.  Hx prior positive HR HPV.  Hx urinary incontinence. Hx fecal soiling.  Improved with dietary change.  Hx radiation therapy.  Status post bilateral mastectomy for breast cancer.  Osteopenia.  On  Prolia through PCP.  Hx NSTEMI.  On Plavix.   PLAN:  Pap and HRV collected:  Yes.   Rx for Prosthetic bra.   Supply 2 bras.  Refill:  2.  Prolia through PCP.  Guidelines for Calcium, Vitamin D, regular exercise program including cardiovascular and weight bearing exercise. Prolia and bone density follow up per PCP.  Follow up:  yearly and prn.    30 min  total time was spent for this patient encounter, including preparation, face-to-face counseling with the patient, coordination of care, and documentation of the encounter in addition to doing the breast and pelvic exam.   An After Visit Summary was provided to the patient.

## 2023-02-04 ENCOUNTER — Other Ambulatory Visit (HOSPITAL_COMMUNITY): Payer: Self-pay | Admitting: Physician Assistant

## 2023-02-04 ENCOUNTER — Encounter (HOSPITAL_COMMUNITY)
Admission: RE | Admit: 2023-02-04 | Discharge: 2023-02-04 | Disposition: A | Discharge status: Home / Self Care | Payer: Medicare HMO | Source: Ambulatory Visit | Attending: Cardiology | Admitting: Cardiology

## 2023-02-04 DIAGNOSIS — R0602 Shortness of breath: Secondary | ICD-10-CM

## 2023-02-04 DIAGNOSIS — T148XXA Other injury of unspecified body region, initial encounter: Secondary | ICD-10-CM

## 2023-02-04 DIAGNOSIS — S0993XD Unspecified injury of face, subsequent encounter: Secondary | ICD-10-CM

## 2023-02-04 DIAGNOSIS — Z9181 History of falling: Secondary | ICD-10-CM | POA: Diagnosis not present

## 2023-02-04 DIAGNOSIS — I214 Non-ST elevation (NSTEMI) myocardial infarction: Secondary | ICD-10-CM

## 2023-02-04 DIAGNOSIS — S0083XD Contusion of other part of head, subsequent encounter: Secondary | ICD-10-CM | POA: Diagnosis not present

## 2023-02-04 DIAGNOSIS — S1083XD Contusion of other specified part of neck, subsequent encounter: Secondary | ICD-10-CM | POA: Diagnosis not present

## 2023-02-04 DIAGNOSIS — Z48812 Encounter for surgical aftercare following surgery on the circulatory system: Secondary | ICD-10-CM | POA: Diagnosis not present

## 2023-02-05 ENCOUNTER — Ambulatory Visit (HOSPITAL_COMMUNITY)
Admission: RE | Admit: 2023-02-05 | Discharge: 2023-02-05 | Disposition: A | Discharge status: Home / Self Care | Payer: Medicare HMO | Source: Ambulatory Visit | Attending: Physician Assistant | Admitting: Physician Assistant

## 2023-02-05 DIAGNOSIS — S0993XD Unspecified injury of face, subsequent encounter: Secondary | ICD-10-CM | POA: Diagnosis not present

## 2023-02-05 DIAGNOSIS — R0602 Shortness of breath: Secondary | ICD-10-CM | POA: Diagnosis not present

## 2023-02-05 DIAGNOSIS — S199XXA Unspecified injury of neck, initial encounter: Secondary | ICD-10-CM | POA: Diagnosis not present

## 2023-02-05 DIAGNOSIS — T148XXA Other injury of unspecified body region, initial encounter: Secondary | ICD-10-CM

## 2023-02-05 DIAGNOSIS — S0993XA Unspecified injury of face, initial encounter: Secondary | ICD-10-CM | POA: Diagnosis not present

## 2023-02-05 DIAGNOSIS — S0512XA Contusion of eyeball and orbital tissues, left eye, initial encounter: Secondary | ICD-10-CM | POA: Diagnosis not present

## 2023-02-05 DIAGNOSIS — I6523 Occlusion and stenosis of bilateral carotid arteries: Secondary | ICD-10-CM | POA: Diagnosis not present

## 2023-02-05 DIAGNOSIS — I672 Cerebral atherosclerosis: Secondary | ICD-10-CM | POA: Diagnosis not present

## 2023-02-05 MED ORDER — IOHEXOL 350 MG/ML SOLN
75.0000 mL | Freq: Once | INTRAVENOUS | Status: AC | PRN
  Administered 2023-02-05: 75 mL via INTRAVENOUS

## 2023-02-06 ENCOUNTER — Encounter (HOSPITAL_COMMUNITY)
Admission: RE | Admit: 2023-02-06 | Discharge: 2023-02-06 | Disposition: A | Discharge status: Home / Self Care | Payer: Medicare HMO | Source: Ambulatory Visit | Attending: Cardiology | Admitting: Cardiology

## 2023-02-06 DIAGNOSIS — I214 Non-ST elevation (NSTEMI) myocardial infarction: Secondary | ICD-10-CM

## 2023-02-06 NOTE — Progress Notes (Signed)
Incomplete Session Note  Patient Details  Name: Beth Simon MRN: [id number] Date of Birth: 10/07/1940 Referring Provider:   Flowsheet Row INTENSIVE CARDIAC REHAB ORIENT from 11/13/2022 in [facility]  Referring Provider Peter Swaziland, MD       Beth Simon did not complete her rehab session.  Beth Simon says she does not feel up to exercising today as she took extra furosemide for 3 days. Blood pressure 130/70. Beth Simon hopes to return to exercise on Friday.Thayer Headings RN BSN

## 2023-02-09 DIAGNOSIS — R69 Illness, unspecified: Secondary | ICD-10-CM | POA: Diagnosis not present

## 2023-02-11 ENCOUNTER — Encounter (HOSPITAL_COMMUNITY)
Admission: RE | Admit: 2023-02-11 | Discharge: 2023-02-11 | Discharge status: Home / Self Care | Payer: Medicare HMO | Source: Ambulatory Visit | Attending: Cardiology

## 2023-02-11 DIAGNOSIS — I214 Non-ST elevation (NSTEMI) myocardial infarction: Secondary | ICD-10-CM

## 2023-02-11 DIAGNOSIS — Z48812 Encounter for surgical aftercare following surgery on the circulatory system: Secondary | ICD-10-CM | POA: Diagnosis not present

## 2023-02-13 ENCOUNTER — Encounter (HOSPITAL_COMMUNITY)
Admission: RE | Admit: 2023-02-13 | Discharge: 2023-02-13 | Disposition: A | Discharge status: Home / Self Care | Payer: Medicare HMO | Source: Ambulatory Visit | Attending: Cardiology | Admitting: Cardiology

## 2023-02-13 ENCOUNTER — Ambulatory Visit
Admission: RE | Admit: 2023-02-13 | Discharge: 2023-02-13 | Disposition: A | Discharge status: Home / Self Care | Payer: Medicare HMO | Source: Ambulatory Visit | Attending: Internal Medicine | Admitting: Internal Medicine

## 2023-02-13 ENCOUNTER — Other Ambulatory Visit: Payer: Self-pay | Admitting: Internal Medicine

## 2023-02-13 DIAGNOSIS — I214 Non-ST elevation (NSTEMI) myocardial infarction: Secondary | ICD-10-CM

## 2023-02-13 DIAGNOSIS — R0602 Shortness of breath: Secondary | ICD-10-CM

## 2023-02-13 DIAGNOSIS — S0083XS Contusion of other part of head, sequela: Secondary | ICD-10-CM | POA: Diagnosis not present

## 2023-02-13 DIAGNOSIS — S0993XD Unspecified injury of face, subsequent encounter: Secondary | ICD-10-CM | POA: Diagnosis not present

## 2023-02-13 DIAGNOSIS — Z48812 Encounter for surgical aftercare following surgery on the circulatory system: Secondary | ICD-10-CM | POA: Diagnosis not present

## 2023-02-13 DIAGNOSIS — J301 Allergic rhinitis due to pollen: Secondary | ICD-10-CM | POA: Diagnosis not present

## 2023-02-16 ENCOUNTER — Other Ambulatory Visit (HOSPITAL_COMMUNITY)
Admission: RE | Admit: 2023-02-16 | Discharge: 2023-02-16 | Disposition: A | Discharge status: Home / Self Care | Payer: Medicare HMO | Source: Ambulatory Visit | Attending: Obstetrics and Gynecology | Admitting: Obstetrics and Gynecology

## 2023-02-16 ENCOUNTER — Ambulatory Visit (INDEPENDENT_AMBULATORY_CARE_PROVIDER_SITE_OTHER): Payer: Medicare HMO | Admitting: Obstetrics and Gynecology

## 2023-02-16 ENCOUNTER — Encounter: Payer: Self-pay | Admitting: Obstetrics and Gynecology

## 2023-02-16 VITALS — BP 118/76 | HR 63 | Ht 58.5 in | Wt 121.0 lb

## 2023-02-16 DIAGNOSIS — Z8542 Personal history of malignant neoplasm of other parts of uterus: Secondary | ICD-10-CM | POA: Insufficient documentation

## 2023-02-16 DIAGNOSIS — Z01419 Encounter for gynecological examination (general) (routine) without abnormal findings: Secondary | ICD-10-CM | POA: Diagnosis not present

## 2023-02-16 DIAGNOSIS — R69 Illness, unspecified: Secondary | ICD-10-CM | POA: Diagnosis not present

## 2023-02-16 DIAGNOSIS — Z1272 Encounter for screening for malignant neoplasm of vagina: Secondary | ICD-10-CM | POA: Insufficient documentation

## 2023-02-16 DIAGNOSIS — Z87411 Personal history of vaginal dysplasia: Secondary | ICD-10-CM

## 2023-02-16 DIAGNOSIS — Z853 Personal history of malignant neoplasm of breast: Secondary | ICD-10-CM | POA: Diagnosis not present

## 2023-02-16 DIAGNOSIS — Z1151 Encounter for screening for human papillomavirus (HPV): Secondary | ICD-10-CM | POA: Insufficient documentation

## 2023-02-16 MED ORDER — NONFORMULARY OR COMPOUNDED ITEM: 2 refills | Status: AC

## 2023-02-16 NOTE — Patient Instructions (Signed)

## 2023-02-17 ENCOUNTER — Ambulatory Visit: Payer: Medicare HMO | Admitting: Podiatry

## 2023-02-17 DIAGNOSIS — S43102A Unspecified dislocation of left acromioclavicular joint, initial encounter: Secondary | ICD-10-CM | POA: Diagnosis not present

## 2023-02-17 NOTE — Progress Notes (Signed)
Cardiac Individual Treatment Plan  Patient Details  Name: Beth Simon MRN: [id number] Date of Birth: [birth year] Referring Provider:   Flowsheet Row INTENSIVE CARDIAC REHAB ORIENT from 11/13/2022 in [facility]  Referring Provider Peter Swaziland, MD       Initial Encounter Date:  Flowsheet Row INTENSIVE CARDIAC REHAB ORIENT from 11/13/2022 in [facility]  Date 11/13/22       Visit Diagnosis: 10/17/22 NSTEMI (non-ST elevated myocardial infarction) [facility])  Patient's Home Medications on Admission:  Current Outpatient Medications:    aspirin EC (ASPIRIN LOW DOSE) 81 MG tablet, TAKE 1 TABLET (81 MG TOTAL) BY MOUTH DAILY. SWALLOW WHOLE., Disp: 90 tablet, Rfl: 3   atorvastatin (LIPITOR) 40 MG tablet, TAKE 1 TABLET BY MOUTH EVERY DAY, Disp: 90 tablet, Rfl: 3   carvedilol (COREG) 3.125 MG tablet, Take 1 tablet (3.125 mg total) by mouth 2 (two) times daily., Disp: 180 tablet, Rfl: 3   clopidogrel (PLAVIX) 75 MG tablet, TAKE 1 TABLET BY MOUTH DAILY WITH BREAKFAST., Disp: 90 tablet, Rfl: 3   Cyanocobalamin (VITAMIN B 12 PO), Take 1 tablet by mouth every morning. , Disp: , Rfl:    dapagliflozin propanediol (FARXIGA) 10 MG TABS tablet, Take 1 tablet (10 mg total) by mouth daily before breakfast., Disp: 90 tablet, Rfl: 3   denosumab (PROLIA) 60 MG/ML SOLN injection, Inject 60 mg into the skin every 6 (six) months. Administer in upper arm, thigh, or abdomen, Disp: , Rfl:    famotidine (PEPCID) 40 MG tablet, Take 40 mg by mouth daily., Disp: , Rfl:    Flaxseed, Linseed, (FLAX SEEDS PO), Take 1 scoop by mouth every morning. Ground flaxseed in cereal., Disp: , Rfl:    furosemide (LASIX) 40 MG tablet, Take 40 mg by mouth daily., Disp: , Rfl:    hydrALAZINE (APRESOLINE) 25 MG tablet, Take 1 tablet (25 mg total) by mouth as needed (for blood pressure greater than 160/100. May take up to twice  daily.)., Disp: 180 tablet, Rfl: 3   lisinopril (ZESTRIL) 5 MG tablet, Take 5 mg by mouth daily., Disp: , Rfl:    losartan (COZAAR) 25 MG tablet, Take 1 tablet (25 mg total) by mouth daily., Disp: 90 tablet, Rfl: 3   Multiple Vitamins-Minerals (PRESERVISION AREDS) CAPS, Take 1 capsule by mouth daily., Disp: , Rfl:    NONFORMULARY OR COMPOUNDED ITEM, Prosthetic bra.  Please supply 2 bras.  Refill:  2., Disp: 2 each, Rfl: 2   OVER THE COUNTER MEDICATION, Take 1 tablet by mouth every morning. Astazanthin. (anti-oxidant), Disp: , Rfl:    VITAMIN D, CHOLECALCIFEROL, PO, Take 4,000 Int'l Units by mouth every morning. , Disp: , Rfl:   Past Medical History: Past Medical History:  Diagnosis Date   Adenomatous polyps 01/2016   Anemia    Breast cancer (HCC) 1990   bilateral mastectomy, adenoca breast-left MRM, reconstruction, chemo   Bronchitis last 2 weeks   saw dr Pete Glatter 02-28-2014, he said no antibiotics needed, nonproductive cough   Complication of anesthesia    Cystitis    cytoxen, had once or twice   Family history of breast cancer    History of breast cancer    History of radiation therapy 2/10, 2/12, 2/18, 2/24, 05/29/14   vaginal cuff/ 30 Gy/ 5 fx   Hypertension    Macular degeneration    Neck pain    taking physictal therapy last 2  weeks   Osteoporosis    On Prolia.   PONV (postoperative nausea and vomiting)    Shoulder pain    taking physical therapy for last few weeks   Uterine cancer (HCC) 03/21/2014   MLH1/PMS2 LOH   Uterine fibroid    VAIN I (vaginal intraepithelial neoplasia grade I) 2018   positive HR HPV.   Vasovagal syncopes     Tobacco Use: Social History   Tobacco Use  Smoking Status Never  Smokeless Tobacco Never    Labs: Review Flowsheet       Latest Ref Rng & Units 10/07/2013 10/17/2022 12/25/2022 01/17/2023  Labs for ITP Cardiac and Pulmonary Rehab  Cholestrol 100 - 199 mg/dL - 161  096  -  LDL (calc) 0 - 99 mg/dL - 045  76  -  HDL-C >40 mg/dL - 78   60  -  Trlycerides 0 - 149 mg/dL - 79  65  -  Hemoglobin A1c 4.8 - 5.6 % 5.9  5.7  - -  PH, Arterial 7.35 - 7.45 - 7.410  - -  PCO2 arterial 32 - 48 mmHg - 33.2  - -  Bicarbonate 20.0 - 28.0 mmol/L 20.0 - 28.0 mmol/L - 22.2  23.8  21.0  - -  TCO2 22 - 32 mmol/L - 23  25  22  23   - 22   Acid-base deficit 0.0 - 2.0 mmol/L - 2.0  3.0  - -  O2 Saturation % % - 64  63  94  - -    Details       Multiple values from one day are sorted in reverse-chronological order         Capillary Blood Glucose: Lab Results  Component Value Date   GLUCAP 80 05/09/2014     Exercise Target Goals: Exercise Program Goal: Individual exercise prescription set using results from initial 6 min walk test and THRR while considering  patient's activity barriers and safety.   Exercise Prescription Goal: Initial exercise prescription builds to 30-45 minutes a day of aerobic activity, 2-3 days per week.  Home exercise guidelines will be given to patient during program as part of exercise prescription that the participant will acknowledge.  Activity Barriers & Risk Stratification:  Activity Barriers & Cardiac Risk Stratification - 11/13/22 1148       Activity Barriers & Cardiac Risk Stratification   Activity Barriers Decreased Ventricular Function;Chest Pain/Angina    Cardiac Risk Stratification High   <5 METs on            6 Minute Walk:  6 Minute Walk     Row Name 11/13/22 1145         6 Minute Walk   Phase Initial     Distance 480 feet     Walk Time 6 minutes     # of Rest Breaks 1  2:00-6:00     MPH 0.91     METS 0.83     RPE 13     Perceived Dyspnea  0     VO2 Peak 2.91     Symptoms Yes (comment)     Comments Chest pressure reported at 2 min mark. Test aborted, BP 190/72, 98% NSR. No other symptoms reported, pressure resolved once seated. RN Corrie Dandy made aware, contacted Dr. Swaziland.     Resting HR 62 bpm     Resting BP 148/72     Resting Oxygen Saturation  97 %     Exercise  Oxygen  Saturation  during 6 min walk 98 %     Max Ex. HR 82 bpm     Max Ex. BP 190/72     2 Minute Post BP 188/70              Oxygen Initial Assessment:   Oxygen Re-Evaluation:   Oxygen Discharge (Final Oxygen Re-Evaluation):   Initial Exercise Prescription:  Initial Exercise Prescription - 11/13/22 1100       Date of Initial Exercise RX and Referring Provider   Date 11/13/22    Referring Provider Peter Swaziland, MD    Expected Discharge Date 02/04/23      NuStep   Level 1    SPM 60    Minutes 15    METs 1.8      Track   Laps 15    Minutes 15    METs 2      Prescription Details   Frequency (times per week) 3    Duration Progress to 30 minutes of continuous aerobic without signs/symptoms of physical distress      Intensity   THRR 40-80% of Max Heartrate 55-110    Ratings of Perceived Exertion 11-13    Perceived Dyspnea 0-4      Progression   Progression Continue progressive overload as per policy without signs/symptoms or physical distress.      Resistance Training   Training Prescription Yes    Weight 2    Reps 10-15             Perform Capillary Blood Glucose checks as needed.  Exercise Prescription Changes:   Exercise Prescription Changes     Row Name 11/21/22 1300 12/03/22 1500 12/19/22 1500 01/09/23 1500       Response to Exercise   Blood Pressure (Admit) 140/72 126/72 122/70 118/72    Blood Pressure (Exercise) 154/72 134/70 132/70 --    Blood Pressure (Exit) 132/70 130/70 116/62 104/62    Heart Rate (Admit) 61 bpm 56 bpm 61 bpm 59 bpm    Heart Rate (Exercise) 82 bpm 105 bpm 92 bpm 100 bpm    Heart Rate (Exit) 59 bpm 64 bpm 69 bpm 68 bpm    Rating of Perceived Exertion (Exercise) 9 12 13 12     Symptoms None None none None    Comments Pt's first day in the CRP2 program Reviewed METs Reviewed METs and brainstorimg HEP Reviewed METs, goals and HERx    Duration Continue with 30 min of aerobic exercise without signs/symptoms of physical  distress. Continue with 30 min of aerobic exercise without signs/symptoms of physical distress. Continue with 30 min of aerobic exercise without signs/symptoms of physical distress. Continue with 30 min of aerobic exercise without signs/symptoms of physical distress.    Intensity THRR unchanged THRR unchanged THRR unchanged THRR unchanged      Progression   Progression Continue to progress workloads to maintain intensity without signs/symptoms of physical distress. Continue to progress workloads to maintain intensity without signs/symptoms of physical distress. Continue to progress workloads to maintain intensity without signs/symptoms of physical distress. Continue to progress workloads to maintain intensity without signs/symptoms of physical distress.    Average METs 1.75 2.7 3.4 3.55      Resistance Training   Training Prescription Yes No Yes Yes    Weight 2 lbs No weights on wednesdays 2 2    Reps 10-15 -- 10-15 10-15    Time 10 Minutes -- 10 Minutes 10 Minutes      Interval Training  Interval Training No No -- No      Recumbant Bike   Level -- 1 -- --    RPM -- 75 -- --    Watts -- 16 -- --    Minutes -- 15 -- --    METs -- 2 -- --      NuStep   Level 1 -- -- --    SPM 45 -- -- --    Minutes 15 -- -- --    METs 1.2 -- -- --      Track   Laps 10 19 20 30  23  laps first 15, 17 laps 2nd 15 Avg METS 3.55 (30 laps total)    Minutes 15 15 15 30     METs 2.28 3.43 3.55 3.55      Home Exercise Plan   Plans to continue exercise at -- -- -- Home (comment)    Frequency -- -- -- Add 2 additional days to program exercise sessions.    Initial Home Exercises Provided -- -- -- 01/09/23             Exercise Comments:   Exercise Comments     Row Name 11/21/22 1402 12/03/22 1500 01/09/23 1500       Exercise Comments Pt's first day in the CRP2 program. Pt exercised today without complaints. Reviewed METs. Pt is making good progess, peak METs are 3.4.  Changed to recumebnt bike  today because nustep was bothering her back. Back feels more comfortable on bike. Reviewed METs, goals and HERx. Pt is making good progess. Pt enjoys walking so pat is doing this as her 30 minutes of exercise here in the CRP2 program.              Exercise Goals and Review:   Exercise Goals     Row Name 11/13/22 0821             Exercise Goals   Increase Physical Activity Yes       Intervention Provide advice, education, support and counseling about physical activity/exercise needs.;Develop an individualized exercise prescription for aerobic and resistive training based on initial evaluation findings, risk stratification, comorbidities and participant's personal goals.       Expected Outcomes Short Term: Attend rehab on a regular basis to increase amount of physical activity.;Long Term: Exercising regularly at least 3-5 days a week.;Long Term: Add in home exercise to make exercise part of routine and to increase amount of physical activity.       Increase Strength and Stamina Yes       Intervention Provide advice, education, support and counseling about physical activity/exercise needs.;Develop an individualized exercise prescription for aerobic and resistive training based on initial evaluation findings, risk stratification, comorbidities and participant's personal goals.       Expected Outcomes Short Term: Perform resistance training exercises routinely during rehab and add in resistance training at home;Short Term: Increase workloads from initial exercise prescription for resistance, speed, and METs.;Long Term: Improve cardiorespiratory fitness, muscular endurance and strength as measured by increased METs and functional capacity ( )       Able to understand and use rate of perceived exertion (RPE) scale Yes       Intervention Provide education and explanation on how to use RPE scale       Expected Outcomes Short Term: Able to use RPE daily in rehab to express subjective intensity  level;Long Term:  Able to use RPE to guide intensity level when exercising independently  Knowledge and understanding of Target Heart Rate Range (THRR) Yes       Intervention Provide education and explanation of THRR including how the numbers were predicted and where they are located for reference       Expected Outcomes Short Term: Able to state/look up THRR;Short Term: Able to use daily as guideline for intensity in rehab;Long Term: Able to use THRR to govern intensity when exercising independently       Understanding of Exercise Prescription Yes       Intervention Provide education, explanation, and written materials on patient's individual exercise prescription       Expected Outcomes Short Term: Able to explain program exercise prescription;Long Term: Able to explain home exercise prescription to exercise independently                Exercise Goals Re-Evaluation :  Exercise Goals Re-Evaluation     Row Name 11/21/22 1401 12/19/22 1503 01/09/23 1500         Exercise Goal Re-Evaluation   Exercise Goals Review Increase Physical Activity;Increase Strength and Stamina;Able to understand and use rate of perceived exertion (RPE) scale;Knowledge and understanding of Target Heart Rate Range (THRR);Understanding of Exercise Prescription -- Increase Physical Activity;Increase Strength and Stamina;Able to understand and use rate of perceived exertion (RPE) scale;Knowledge and understanding of Target Heart Rate Range (THRR);Understanding of Exercise Prescription     Comments Pt's first day in the CRP2 program. Pt understands the THRR, RPE sclae and exercise Rx. Reviewed METs and Goals and discussed HEP with pt. Pt is proud of her exercise progress but admits she is not yet independent in preparing meals for herself at home. She admits to buying [employer] and adding more vegetables to make it healthier. I asked pt to meet with our dietician to help her reach her goals.  Pt reports that she  likes to walk but feels unsafe walking by herself. She does walk x1/week outdoors with her church group and attends a pilates class x1/week but wants to do more. She does not like going to the [facility], I will brainstorm and create a definitive plan with patient soon. Reviewed METs, goals and HERx. Pt voices that she is making good progress on her goal of increased strength and stamina. Pt is still working on her healthy cooking for one person. Pt walks with her group of friends and encouraged her to try to add another day of walking. Suggested pateint could go to a home improvement store and walk inside, and pt would feel safe. Will follow up on her progress in making a plan.     Expected Outcomes Will continue to monitor patient and progress exercise workloads as tolerated. -- Will continue to monitor patient and progress exercise workloads as tolerated.              Discharge Exercise Prescription (Final Exercise Prescription Changes):  Exercise Prescription Changes - 01/09/23 1500       Response to Exercise   Blood Pressure (Admit) 118/72    Blood Pressure (Exit) 104/62    Heart Rate (Admit) 59 bpm    Heart Rate (Exercise) 100 bpm    Heart Rate (Exit) 68 bpm    Rating of Perceived Exertion (Exercise) 12    Symptoms None    Comments Reviewed METs, goals and HERx    Duration Continue with 30 min of aerobic exercise without signs/symptoms of physical distress.    Intensity THRR unchanged      Progression   Progression  Continue to progress workloads to maintain intensity without signs/symptoms of physical distress.    Average METs 3.55      Resistance Training   Training Prescription Yes    Weight 2    Reps 10-15    Time 10 Minutes      Interval Training   Interval Training No      Track   Laps 30   23 laps first 15, 17 laps 2nd 15 Avg METS 3.55 (30 laps total)   Minutes 30    METs 3.55      Home Exercise Plan   Plans to continue exercise at Home (comment)    Frequency Add 2  additional days to program exercise sessions.    Initial Home Exercises Provided 01/09/23             Nutrition:  Target Goals: Understanding of nutrition guidelines, daily intake of sodium 1500mg , cholesterol 200mg , calories 30% from fat and 7% or less from saturated fats, daily to have 5 or more servings of fruits and vegetables.  Biometrics:  Pre Biometrics - 11/13/22 0804       Pre Biometrics   Waist Circumference 34.5 inches    Hip Circumference 39 inches    Waist to Hip Ratio 0.88 %    Triceps Skinfold 26 mm    % Body Fat 39.6 %    Grip Strength 22 kg    Flexibility 12 in    Single Leg Stand 13.2 seconds              Nutrition Therapy Plan and Nutrition Goals:  Nutrition Therapy & Goals - 02/17/23 1042       Nutrition Therapy   Diet Heart Healthy Diet    Drug/Food Interactions Statins/Certain Fruits      Personal Nutrition Goals   Nutrition Goal Patient to identify strategies for reducing cardiovascular risk by attending the [employer] education and nutrition series weekly.   goal in action.   Personal Goal #2 Patient to improve diet quality by using the plate method as a guide for meal planning to include lean protein/plant protein, fruits, vegetables, whole grains, nonfat dairy as part of a well-balanced diet   goal in progress.   Personal Goal #3 Patient to decrease sodium 1500mg  per day   goal in progress.   Comments Goals in progress. Beth Simon continues to attend the [employer] and nutrition series regularly. Prior to starting cardiac rehab, Beth Simon reports that she has already started making some dietary changes including eating three meals daily and reduced sugar/refined carbohydrate intake. She has started cooking more using her crockpot and is implementing more vegetables and whole grains. She reports weight loss of ~10# since initial NSTEMI hospitalization on 7/19 at 132#. She is motivated to continue to lose weight with goal weight of ~115#. She  is down 4.2# since starting with our program. Her LDL has improved to <100 (76). Patient will benefit from participation in intensive cardiac rehab for nutrition, exercise, and lifestyle modification.      Intervention Plan   Intervention Prescribe, educate and counsel regarding individualized specific dietary modifications aiming towards targeted core components such as weight, hypertension, lipid management, diabetes, heart failure and other comorbidities.;Nutrition handout(s) given to patient.    Expected Outcomes Short Term Goal: Understand basic principles of dietary content, such as calories, fat, sodium, cholesterol and nutrients.;Long Term Goal: Adherence to prescribed nutrition plan.             Nutrition Assessments:  Nutrition Assessments -  11/20/22 1448       Rate Your Plate Scores   Pre Score 76            MEDIFICTS Score Key: >=70 Need to make dietary changes  40-70 Heart Healthy Diet <= 40 Therapeutic Level Cholesterol Diet   Flowsheet Row INTENSIVE CARDIAC REHAB from 11/19/2022 in [facility]  Picture Your Plate Total Score on Admission 76      Picture Your Plate Scores: <35 Unhealthy dietary pattern with much room for improvement. 41-50 Dietary pattern unlikely to meet recommendations for good health and room for improvement. 51-60 More healthful dietary pattern, with some room for improvement.  >60 Healthy dietary pattern, although there may be some specific behaviors that could be improved.    Nutrition Goals Re-Evaluation:  Nutrition Goals Re-Evaluation     Row Name 11/21/22 1304 12/22/22 1624 01/20/23 1428 02/17/23 1042       Goals   Current Weight 127 lb 3.3 oz (57.7 kg) 126 lb 5.2 oz (57.3 kg) 128 lb 8.5 oz (58.3 kg) 123 lb 0.3 oz (55.8 kg)    Comment lipoproteinA 104, A1c 5.7, cholesterol 205, LDL 111 no new labs; most recent labs lipoproteinA 104, A1c 5.7, cholesterol 205, LDL 111  (lipitor) LDL 76; other most recent labs lipoproteinA 104, A1c 5.7 no new labs; most recent labs LDL 76, lipoproteinA 104, T7D 5.7    Expected Outcome Beth Simon reports that she has already started making some dietary changes including eating three meals daily and reduced sugar/refined carbohydrate intake. She reports weight loss of ~7# since initial NSTEMI hospitalization on 7/19 at 132#. She is motivated to continue to lose weight with goal weight of ~115#. She is currently using [employer] meal delivery services (14 days worth of meals). Patient will benefit from participation in intensive cardiac rehab for nutrition, exercise, and lifestyle modification. Goals in progress. Beth Simon continues to attend the [employer] and nutrition series regularly. Prior to starting cardiac rehab, Beth Simon reports that she has already started making some dietary changes including eating three meals daily and reduced sugar/refined carbohydrate intake. She reports weight loss of ~8# since initial NSTEMI hospitalization on 7/19 at 132#. She is motivated to continue to lose weight with goal weight of ~115#. She has maintained her weight since starting with our program. She is currently using [employer] meal delivery services (14 days worth of meals). Patient will benefit from participation in intensive cardiac rehab for nutrition, exercise, and lifestyle modification. Goals in progress. Beth Simon continues to attend the [employer] and nutrition series regularly. Prior to starting cardiac rehab, Azzareya reports that she has already started making some dietary changes including eating three meals daily and reduced sugar/refined carbohydrate intake. She has started cooking more using her crockpot and is implementing more vegetables and whole grains. She reports weight loss of ~8# since initial NSTEMI hospitalization on 7/19 at 132#. She is motivated to continue to lose weight with goal weight of ~115#. She is up 1.3# since starting  with our program. Her LDL has improved to <100 (76). Patient will benefit from participation in intensive cardiac rehab for nutrition, exercise, and lifestyle modification. Goals in progress. Beth Simon continues to attend the [employer] and nutrition series regularly. Prior to starting cardiac rehab, Beth Simon reports that she has already started making some dietary changes including eating three meals daily and reduced sugar/refined carbohydrate intake. She has started cooking more using her crockpot and is implementing more vegetables and  whole grains. She reports weight loss of ~10# since initial NSTEMI hospitalization on 7/19 at 132#. She is motivated to continue to lose weight with goal weight of ~115#. She is down 4.2# since starting with our program. Her LDL has improved to <100 (76). Patient will benefit from participation in intensive cardiac rehab for nutrition, exercise, and lifestyle modification.             Nutrition Goals Re-Evaluation:  Nutrition Goals Re-Evaluation     Row Name 11/21/22 1304 12/22/22 1624 01/20/23 1428 02/17/23 1042       Goals   Current Weight 127 lb 3.3 oz (57.7 kg) 126 lb 5.2 oz (57.3 kg) 128 lb 8.5 oz (58.3 kg) 123 lb 0.3 oz (55.8 kg)    Comment lipoproteinA 104, A1c 5.7, cholesterol 205, LDL 111 no new labs; most recent labs lipoproteinA 104, A1c 5.7, cholesterol 205, LDL 111 (lipitor) LDL 76; other most recent labs lipoproteinA 104, A1c 5.7 no new labs; most recent labs LDL 76, lipoproteinA 104, Z6X 5.7    Expected Outcome Beth Simon reports that she has already started making some dietary changes including eating three meals daily and reduced sugar/refined carbohydrate intake. She reports weight loss of ~7# since initial NSTEMI hospitalization on 7/19 at 132#. She is motivated to continue to lose weight with goal weight of ~115#. She is currently using [employer] meal delivery services (14 days worth of meals). Patient will benefit from participation in  intensive cardiac rehab for nutrition, exercise, and lifestyle modification. Goals in progress. Beth Simon continues to attend the [employer] and nutrition series regularly. Prior to starting cardiac rehab, Ketrina reports that she has already started making some dietary changes including eating three meals daily and reduced sugar/refined carbohydrate intake. She reports weight loss of ~8# since initial NSTEMI hospitalization on 7/19 at 132#. She is motivated to continue to lose weight with goal weight of ~115#. She has maintained her weight since starting with our program. She is currently using [employer] meal delivery services (14 days worth of meals). Patient will benefit from participation in intensive cardiac rehab for nutrition, exercise, and lifestyle modification. Goals in progress. Beth Simon continues to attend the [employer] and nutrition series regularly. Prior to starting cardiac rehab, Beth Simon reports that she has already started making some dietary changes including eating three meals daily and reduced sugar/refined carbohydrate intake. She has started cooking more using her crockpot and is implementing more vegetables and whole grains. She reports weight loss of ~8# since initial NSTEMI hospitalization on 7/19 at 132#. She is motivated to continue to lose weight with goal weight of ~115#. She is up 1.3# since starting with our program. Her LDL has improved to <100 (76). Patient will benefit from participation in intensive cardiac rehab for nutrition, exercise, and lifestyle modification. Goals in progress. Beth Simon continues to attend the [employer] and nutrition series regularly. Prior to starting cardiac rehab, Samadhi reports that she has already started making some dietary changes including eating three meals daily and reduced sugar/refined carbohydrate intake. She has started cooking more using her crockpot and is implementing more vegetables and whole grains. She reports weight  loss of ~10# since initial NSTEMI hospitalization on 7/19 at 132#. She is motivated to continue to lose weight with goal weight of ~115#. She is down 4.2# since starting with our program. Her LDL has improved to <100 (76). Patient will benefit from participation in intensive cardiac rehab for nutrition, exercise, and lifestyle modification.  Nutrition Goals Discharge (Final Nutrition Goals Re-Evaluation):  Nutrition Goals Re-Evaluation - 02/17/23 1042       Goals   Current Weight 123 lb 0.3 oz (55.8 kg)    Comment no new labs; most recent labs LDL 76, lipoproteinA 104, W0J 5.7    Expected Outcome Goals in progress. Beth Simon continues to attend the [employer] and nutrition series regularly. Prior to starting cardiac rehab, Beth Simon reports that she has already started making some dietary changes including eating three meals daily and reduced sugar/refined carbohydrate intake. She has started cooking more using her crockpot and is implementing more vegetables and whole grains. She reports weight loss of ~10# since initial NSTEMI hospitalization on 7/19 at 132#. She is motivated to continue to lose weight with goal weight of ~115#. She is down 4.2# since starting with our program. Her LDL has improved to <100 (76). Patient will benefit from participation in intensive cardiac rehab for nutrition, exercise, and lifestyle modification.             Psychosocial: Target Goals: Acknowledge presence or absence of significant depression and/or stress, maximize coping skills, provide positive support system. Participant is able to verbalize types and ability to use techniques and skills needed for reducing stress and depression.  Initial Review & Psychosocial Screening:  Initial Psych Review & Screening - 11/13/22 1150       Initial Review   Current issues with None Identified      Family Dynamics   Good Support System? Yes   Beth Simon has her son and friends for support      Barriers   Psychosocial barriers to participate in program There are no identifiable barriers or psychosocial needs.      Screening Interventions   Interventions Encouraged to exercise;Provide feedback about the scores to participant    Expected Outcomes Short Term goal: Identification and review with participant of any Quality of Life or Depression concerns found by scoring the questionnaire.;Long Term goal: The participant improves quality of Life and PHQ9 Scores as seen by post scores and/or verbalization of changes             Quality of Life Scores:  Quality of Life - 11/13/22 1151       Quality of Life   Select Quality of Life      Quality of Life Scores   Health/Function Pre 28.18 %    Socioeconomic Pre 27.75 %    Psych/Spiritual Pre 28.93 %    Family Pre 27 %    GLOBAL Pre 28.11 %            Scores of 19 and below usually indicate a poorer quality of life in these areas.  A difference of  2-3 points is a clinically meaningful difference.  A difference of 2-3 points in the total score of the Quality of Life Index has been associated with significant improvement in overall quality of life, self-image, physical symptoms, and general health in studies assessing change in quality of life.  PHQ-9: Review Flowsheet  More data exists      11/13/2022 10/16/2017 10/10/2016 10/05/2015 01/05/2015  Depression screen PHQ 2/9  Decreased Interest 1 0 0 0 1  Down, Depressed, Hopeless 0 0 0 0 1  PHQ - 2 Score 1 0 0 0 2  Altered sleeping 0 - - - -  Tired, decreased energy 1 - - - -  Change in appetite 1 - - - -  Feeling bad or failure about yourself  0 - - - -  Trouble concentrating 0 - - - -  Moving slowly or fidgety/restless 0 - - - -  Suicidal thoughts 0 - - - -  PHQ-9 Score 3 - - - -  Difficult doing work/chores Not difficult at all - - - -    Details           Interpretation of Total Score  Total Score Depression Severity:  1-4 = Minimal depression, 5-9 = Mild  depression, 10-14 = Moderate depression, 15-19 = Moderately severe depression, 20-27 = Severe depression   Psychosocial Evaluation and Intervention:   Psychosocial Re-Evaluation:  Psychosocial Re-Evaluation     Row Name 11/21/22 1435 12/23/22 1639 01/20/23 1342 02/17/23 1512       Psychosocial Re-Evaluation   Current issues with None Identified None Identified Current Stress Concerns Current Stress Concerns    Comments No psychosocial needs identified, no interventions needed No psychosocial needs identified, no interventions needed patient fell near her home on 01/17/23 No concerns or stressors have been voiced during exercise at cardiac rehab.    Interventions Encouraged to attend Cardiac Rehabilitation for the exercise Encouraged to attend Cardiac Rehabilitation for the exercise Encouraged to attend Cardiac Rehabilitation for the exercise Encouraged to attend Cardiac Rehabilitation for the exercise    Continue Psychosocial Services  No Follow up required No Follow up required No Follow up required No Follow up required      Initial Review   Source of Stress Concerns -- -- Chronic Illness Chronic Illness    Comments -- -- Will continue to monitor and offer support as needed. Will continue to monitor and offer support as needed.             Psychosocial Discharge (Final Psychosocial Re-Evaluation):  Psychosocial Re-Evaluation - 02/17/23 1512       Psychosocial Re-Evaluation   Current issues with Current Stress Concerns    Comments No concerns or stressors have been voiced during exercise at cardiac rehab.    Interventions Encouraged to attend Cardiac Rehabilitation for the exercise    Continue Psychosocial Services  No Follow up required      Initial Review   Source of Stress Concerns Chronic Illness    Comments Will continue to monitor and offer support as needed.             Vocational Rehabilitation: Provide vocational rehab assistance to qualifying candidates.    Vocational Rehab Evaluation & Intervention:  Vocational Rehab - 11/13/22 0824       Initial Vocational Rehab Evaluation & Intervention   Assessment shows need for Vocational Rehabilitation No   Beth Simon is retired            Education: Education Goals: Education classes will be provided on a weekly basis, covering required topics. Participant will state understanding/return demonstration of topics presented.    Education     Row Name 11/21/22 1300     Education   Cardiac Education Topics Pritikin   Hospital doctor Education   General Education Heart Disease Risk Reduction   Instruction Review Code 1- Verbalizes Understanding   Class Start Time 1146   Class Stop Time 1223   Class Time Calculation (min) 37 min    Row Name 11/26/22 1400     Education   Cardiac Education Topics Pritikin   Customer service manager  Weekly Topic Fast and Healthy Breakfasts   Instruction Review Code 1- Verbalizes Understanding   Class Start Time 1140   Class Stop Time 1225   Class Time Calculation (min) 45 min    Row Name 11/26/22 1500     Education   Cardiac Education Topics Pritikin   Customer service manager   Weekly Topic Fast and Healthy Breakfasts   Instruction Review Code 1- Verbalizes Understanding   Class Start Time 1400   Class Stop Time 1445   Class Time Calculation (min) 45 min    Row Name 11/28/22 1300     Education   Cardiac Education Topics Pritikin   Writer Psychosocial   Psychosocial Healthy Minds, Bodies, Hearts   Instruction Review Code 1- Verbalizes Understanding   Class Start Time 1145   Class Stop Time 1222   Class Time Calculation (min) 37 min    Row Name 12/03/22 1400     Education   Cardiac Education Topics Pritikin   Teacher, music   Weekly Topic Personalizing Your Pritikin Plate   Instruction Review Code 1- Verbalizes Understanding   Class Start Time 1150   Class Stop Time 1225   Class Time Calculation (min) 35 min    Row Name 12/05/22 1300     Education   Cardiac Education Topics Pritikin   Select Workshops     Workshops   Educator Exercise Physiologist   Select Exercise   Exercise Workshop Location manager and Fall Prevention   Instruction Review Code 1- Verbalizes Understanding   Class Start Time 1151   Class Stop Time 1238   Class Time Calculation (min) 47 min    Row Name 12/10/22 1400     Education   Cardiac Education Topics Pritikin   Customer service manager   Weekly Topic [employer] Desserts   Instruction Review Code 1- Verbalizes Understanding   Class Start Time 1145   Class Stop Time 1230   Class Time Calculation (min) 45 min    Row Name 12/17/22 1400     Education   Cardiac Education Topics Pritikin   Customer service manager   Weekly Topic Tasty Appetizers and Snacks   Instruction Review Code 1- Verbalizes Understanding   Class Start Time 1145   Class Stop Time 1225   Class Time Calculation (min) 40 min    Row Name 12/19/22 1200     Education   Cardiac Education Topics Pritikin   Nurse, children's Exercise Physiologist   Select Nutrition   Nutrition Calorie Density   Instruction Review Code 1- Verbalizes Understanding   Class Start Time 1155   Class Stop Time 1235   Class Time Calculation (min) 40 min    Row Name 12/24/22 1500     Education   Cardiac Education Topics Pritikin   Customer service manager   Weekly Topic Efficiency Cooking - Meals in a Snap   Instruction Review Code 1- Verbalizes Understanding   Class Start Time 1145   Class Stop Time 1224   Class Time Calculation  (min) 39 min  Row Name 12/31/22 1200     Education   Cardiac Education Topics Pritikin   Orthoptist   Educator Dietitian   Weekly Topic One-Pot Wonders   Instruction Review Code 1- Verbalizes Understanding   Class Start Time 1145   Class Stop Time 1225   Class Time Calculation (min) 40 min    Row Name 01/07/23 1500     Education   Cardiac Education Topics Pritikin   Customer service manager   Weekly Topic Comforting Weekend Breakfasts   Instruction Review Code 1- Verbalizes Understanding   Class Start Time 1145   Class Stop Time 1223   Class Time Calculation (min) 38 min    Row Name 01/09/23 1500     Education   Cardiac Education Topics Pritikin   Geographical information systems officer Psychosocial   Psychosocial Workshop Focused Goals, Sustainable Changes   Instruction Review Code 1- Verbalizes Understanding   Class Start Time 1145   Class Stop Time 1230   Class Time Calculation (min) 45 min    Row Name 01/14/23 1500     Education   Cardiac Education Topics Pritikin   Customer service manager   Weekly Topic Fast Evening Meals   Instruction Review Code 1- Verbalizes Understanding   Class Start Time 1145   Class Stop Time 1230   Class Time Calculation (min) 45 min    Row Name 01/16/23 1400     Education   Cardiac Education Topics Pritikin   Licensed conveyancer Nutrition   Nutrition Vitamins and Minerals   Instruction Review Code 1- Verbalizes Understanding   Class Start Time 1145   Class Stop Time 1226   Class Time Calculation (min) 41 min    Row Name 01/28/23 1500     Education   Cardiac Education Topics Pritikin   Customer service manager   Weekly Topic Simple Sides and Sauces   Instruction Review Code 1- Verbalizes  Understanding   Class Start Time 1145   Class Stop Time 1226   Class Time Calculation (min) 41 min    Row Name 01/30/23 1200     Education   Cardiac Education Topics Pritikin   Nurse, children's Exercise Physiologist   Select Psychosocial   Psychosocial How Our Thoughts Can Heal Our Hearts   Instruction Review Code 1- Verbalizes Understanding   Class Start Time 1148   Class Stop Time 1230   Class Time Calculation (min) 42 min    Row Name 02/04/23 1500     Education   Cardiac Education Topics Pritikin   Customer service manager   Weekly Topic Powerhouse Plant-Based Proteins   Instruction Review Code 1- Verbalizes Understanding   Class Start Time 1145   Class Stop Time 1222   Class Time Calculation (min) 37 min    Row Name 02/06/23 1200     Education   Cardiac Education Topics Pritikin   Publishing copy Education  Hypertension and Heart Disease   Instruction Review Code 1- Verbalizes Understanding   Class Start Time 1153   Class Stop Time 1235   Class Time Calculation (min) 42 min    Row Name 02/11/23 1300     Education   Cardiac Education Topics Pritikin   Customer service manager   Weekly Topic Adding Flavor - Sodium-Free   Instruction Review Code 1- Verbalizes Understanding   Class Start Time 1140   Class Stop Time 1220   Class Time Calculation (min) 40 min    Row Name 02/13/23 1200     Education   Cardiac Education Topics Pritikin   Hospital doctor Education   General Education Heart Disease Risk Reduction   Instruction Review Code 1- Verbalizes Understanding   Class Start Time 1157   Class Stop Time 1234   Class Time Calculation (min) 37 min            Core Videos: Exercise     Move It!  Clinical staff conducted group or individual video education with verbal and written material and guidebook.  Patient learns the recommended Pritikin exercise program. Exercise with the goal of living a long, healthy life. Some of the health benefits of exercise include controlled diabetes, healthier blood pressure levels, improved cholesterol levels, improved heart and lung capacity, improved sleep, and better body composition. Everyone should speak with their doctor before starting or changing an exercise routine.  Biomechanical Limitations Clinical staff conducted group or individual video education with verbal and written material and guidebook.  Patient learns how biomechanical limitations can impact exercise and how we can mitigate and possibly overcome limitations to have an impactful and balanced exercise routine.  Body Composition Clinical staff conducted group or individual video education with verbal and written material and guidebook.  Patient learns that body composition (ratio of muscle mass to fat mass) is a key component to assessing overall fitness, rather than body weight alone. Increased fat mass, especially visceral belly fat, can put [place] at increased risk for metabolic syndrome, type 2 diabetes, heart disease, and even death. It is recommended to combine diet and exercise (cardiovascular and resistance training) to improve your body composition. Seek guidance from your physician and exercise physiologist before implementing an exercise routine.  Exercise Action Plan Clinical staff conducted group or individual video education with verbal and written material and guidebook.  Patient learns the recommended strategies to achieve and enjoy long-term exercise adherence, including variety, self-motivation, self-efficacy, and positive decision making. Benefits of exercise include fitness, good health, weight management, more energy, better sleep, less stress, and overall  well-being.  Medical   Heart Disease Risk Reduction Clinical staff conducted group or individual video education with verbal and written material and guidebook.  Patient learns our heart is our most vital organ as it circulates oxygen, nutrients, white blood cells, and hormones throughout the entire body, and carries waste away. Data supports a plant-based eating plan like the Pritikin Program for its effectiveness in slowing progression of and reversing heart disease. The video provides a number of recommendations to address heart disease.   Metabolic Syndrome and Belly Fat  Clinical staff conducted group or individual video education with verbal and written material and guidebook.  Patient learns what metabolic syndrome is, how it leads to heart disease, and how one can reverse it and keep it  from coming back. You have metabolic syndrome if you have 3 of the following 5 criteria: abdominal obesity, high blood pressure, high triglycerides, low HDL cholesterol, and high blood sugar.  Hypertension and Heart Disease Clinical staff conducted group or individual video education with verbal and written material and guidebook.  Patient learns that high blood pressure, or hypertension, is very common in the [place]. Hypertension is largely due to excessive salt intake, but other important risk factors include being overweight, physical inactivity, drinking too much alcohol, smoking, and not eating enough potassium from fruits and vegetables. High blood pressure is a leading risk factor for heart attack, stroke, congestive heart failure, dementia, kidney failure, and premature death. Long-term effects of excessive salt intake include stiffening of the arteries and thickening of heart muscle and organ damage. Recommendations include ways to reduce hypertension and the risk of heart disease.  Diseases of Our Time - Focusing on Diabetes Clinical staff conducted group or individual video education with  verbal and written material and guidebook.  Patient learns why the best way to stop diseases of our time is prevention, through food and other lifestyle changes. Medicine (such as prescription pills and surgeries) is often only a Band-Aid on the problem, not a long-term solution. Most common diseases of our time include obesity, type 2 diabetes, hypertension, heart disease, and cancer. The Pritikin Program is recommended and has been proven to help reduce, reverse, and/or prevent the damaging effects of metabolic syndrome.  Nutrition   Overview of the Pritikin Eating Plan  Clinical staff conducted group or individual video education with verbal and written material and guidebook.  Patient learns about the Pritikin Eating Plan for disease risk reduction. The Pritikin Eating Plan emphasizes a wide variety of unrefined, minimally-processed carbohydrates, like fruits, vegetables, whole grains, and legumes. Go, Caution, and Stop food choices are explained. Plant-based and lean animal proteins are emphasized. Rationale provided for low sodium intake for blood pressure control, low added sugars for blood sugar stabilization, and low added fats and oils for coronary artery disease risk reduction and weight management.  Calorie Density  Clinical staff conducted group or individual video education with verbal and written material and guidebook.  Patient learns about calorie density and how it impacts the Pritikin Eating Plan. Knowing the characteristics of the food you choose will help you decide whether those foods will lead to weight gain or weight loss, and whether you want to consume more or less of them. Weight loss is usually a side effect of the Pritikin Eating Plan because of its focus on low calorie-dense foods.  Label Reading  Clinical staff conducted group or individual video education with verbal and written material and guidebook.  Patient learns about the Pritikin recommended label reading  guidelines and corresponding recommendations regarding calorie density, added sugars, sodium content, and whole grains.  Dining Out - Part 1  Clinical staff conducted group or individual video education with verbal and written material and guidebook.  Patient learns that restaurant meals can be sabotaging because they can be so high in calories, fat, sodium, and/or sugar. Patient learns recommended strategies on how to positively address this and avoid unhealthy pitfalls.  Facts on Fats  Clinical staff conducted group or individual video education with verbal and written material and guidebook.  Patient learns that lifestyle modifications can be just as effective, if not more so, as many medications for lowering your risk of heart disease. A Pritikin lifestyle can help to reduce your risk of inflammation and  atherosclerosis (cholesterol build-up, or plaque, in the artery walls). Lifestyle interventions such as dietary choices and physical activity address the cause of atherosclerosis. A review of the types of fats and their impact on blood cholesterol levels, along with dietary recommendations to reduce fat intake is also included.  Nutrition Action Plan  Clinical staff conducted group or individual video education with verbal and written material and guidebook.  Patient learns how to incorporate Pritikin recommendations into their lifestyle. Recommendations include planning and keeping personal health goals in mind as an important part of their success.  Healthy Mind-Set    Healthy Minds, Bodies, Hearts  Clinical staff conducted group or individual video education with verbal and written material and guidebook.  Patient learns how to identify when they are stressed. Video will discuss the impact of that stress, as well as the many benefits of stress management. Patient will also be introduced to stress management techniques. The way we think, act, and feel has an impact on our hearts.  How Our  Thoughts Can Heal Our Hearts  Clinical staff conducted group or individual video education with verbal and written material and guidebook.  Patient learns that negative thoughts can cause depression and anxiety. This can result in negative lifestyle behavior and serious health problems. Cognitive behavioral therapy is an effective method to help control our thoughts in order to change and improve our emotional outlook.  Additional Videos:  Exercise    Improving Performance  Clinical staff conducted group or individual video education with verbal and written material and guidebook.  Patient learns to use a non-linear approach by alternating intensity levels and lengths of time spent exercising to help burn more calories and lose more body fat. Cardiovascular exercise helps improve heart health, metabolism, hormonal balance, blood sugar control, and recovery from fatigue. Resistance training improves strength, endurance, balance, coordination, reaction time, metabolism, and muscle mass. Flexibility exercise improves circulation, posture, and balance. Seek guidance from your physician and exercise physiologist before implementing an exercise routine and learn your capabilities and proper form for all exercise.  Introduction to Yoga  Clinical staff conducted group or individual video education with verbal and written material and guidebook.  Patient learns about yoga, a discipline of the coming together of mind, breath, and body. The benefits of yoga include improved flexibility, improved range of motion, better posture and core strength, increased lung function, weight loss, and positive self-image. Yoga's heart health benefits include lowered blood pressure, healthier heart rate, decreased cholesterol and triglyceride levels, improved immune function, and reduced stress. Seek guidance from your physician and exercise physiologist before implementing an exercise routine and learn your capabilities and  proper form for all exercise.  Medical   Aging: Enhancing Your Quality of Life  Clinical staff conducted group or individual video education with verbal and written material and guidebook.  Patient learns key strategies and recommendations to stay in good physical health and enhance quality of life, such as prevention strategies, having an advocate, securing a Health Care Proxy and Power of Attorney, and keeping a list of medications and system for tracking them. It also discusses how to avoid risk for bone loss.  Biology of Weight Control  Clinical staff conducted group or individual video education with verbal and written material and guidebook.  Patient learns that weight gain occurs because we consume more calories than we burn (eating more, moving less). Even if your body weight is normal, you may have higher ratios of fat compared to muscle mass. Too much body  fat puts you at increased risk for cardiovascular disease, heart attack, stroke, type 2 diabetes, and obesity-related cancers. In addition to exercise, following the Pritikin Eating Plan can help reduce your risk.  Decoding Lab Results  Clinical staff conducted group or individual video education with verbal and written material and guidebook.  Patient learns that lab test reflects one measurement whose values change over time and are influenced by many factors, including medication, stress, sleep, exercise, food, hydration, pre-existing medical conditions, and more. It is recommended to use the knowledge from this video to become more involved with your lab results and evaluate your numbers to speak with your doctor.   Diseases of Our Time - Overview  Clinical staff conducted group or individual video education with verbal and written material and guidebook.  Patient learns that according to the CDC, 50% to 70% of chronic diseases (such as obesity, type 2 diabetes, elevated lipids, hypertension, and heart disease) are avoidable through  lifestyle improvements including healthier food choices, listening to satiety cues, and increased physical activity.  Sleep Disorders Clinical staff conducted group or individual video education with verbal and written material and guidebook.  Patient learns how good quality and duration of sleep are important to overall health and well-being. Patient also learns about sleep disorders and how they impact health along with recommendations to address them, including discussing with a physician.  Nutrition  Dining Out - Part 2 Clinical staff conducted group or individual video education with verbal and written material and guidebook.  Patient learns how to plan ahead and communicate in order to maximize their dining experience in a healthy and nutritious manner. Included are recommended food choices based on the type of restaurant the patient is visiting.   Fueling a Banker conducted group or individual video education with verbal and written material and guidebook.  There is a strong connection between our food choices and our health. Diseases like obesity and type 2 diabetes are very prevalent and are in large-part due to lifestyle choices. The Pritikin Eating Plan provides plenty of food and hunger-curbing satisfaction. It is easy to follow, affordable, and helps reduce health risks.  Menu Workshop  Clinical staff conducted group or individual video education with verbal and written material and guidebook.  Patient learns that restaurant meals can sabotage health goals because they are often packed with calories, fat, sodium, and sugar. Recommendations include strategies to plan ahead and to communicate with the manager, chef, or server to help order a healthier meal.  Planning Your Eating Strategy  Clinical staff conducted group or individual video education with verbal and written material and guidebook.  Patient learns about the Pritikin Eating Plan and its benefit of  reducing the risk of disease. The Pritikin Eating Plan does not focus on calories. Instead, it emphasizes high-quality, nutrient-rich foods. By knowing the characteristics of the foods, we choose, we can determine their calorie density and make informed decisions.  Targeting Your Nutrition Priorities  Clinical staff conducted group or individual video education with verbal and written material and guidebook.  Patient learns that lifestyle habits have a tremendous impact on disease risk and progression. This video provides eating and physical activity recommendations based on your personal health goals, such as reducing LDL cholesterol, losing weight, preventing or controlling type 2 diabetes, and reducing high blood pressure.  Vitamins and Minerals  Clinical staff conducted group or individual video education with verbal and written material and guidebook.  Patient learns different ways to obtain  key vitamins and minerals, including through a recommended healthy diet. It is important to discuss all supplements you take with your doctor.   Healthy Mind-Set    Smoking Cessation  Clinical staff conducted group or individual video education with verbal and written material and guidebook.  Patient learns that cigarette smoking and tobacco addiction pose a serious health risk which affects millions of people. Stopping smoking will significantly reduce the risk of heart disease, lung disease, and many forms of cancer. Recommended strategies for quitting are covered, including working with your doctor to develop a successful plan.  Culinary   Becoming a Set designer conducted group or individual video education with verbal and written material and guidebook.  Patient learns that cooking at home can be healthy, cost-effective, quick, and puts them in control. Keys to cooking healthy recipes will include looking at your recipe, assessing your equipment needs, planning ahead, making it  simple, choosing cost-effective seasonal ingredients, and limiting the use of added fats, salts, and sugars.  Cooking - Breakfast and Snacks  Clinical staff conducted group or individual video education with verbal and written material and guidebook.  Patient learns how important breakfast is to satiety and nutrition through the entire day. Recommendations include key foods to eat during breakfast to help stabilize blood sugar levels and to prevent overeating at meals later in the day. Planning ahead is also a key component.  Cooking - Educational psychologist conducted group or individual video education with verbal and written material and guidebook.  Patient learns eating strategies to improve overall health, including an approach to cook more at home. Recommendations include thinking of animal protein as a side on your plate rather than center stage and focusing instead on lower calorie dense options like vegetables, fruits, whole grains, and plant-based proteins, such as beans. Making sauces in large quantities to freeze for later and leaving the skin on your vegetables are also recommended to maximize your experience.  Cooking - Healthy Salads and Dressing Clinical staff conducted group or individual video education with verbal and written material and guidebook.  Patient learns that vegetables, fruits, whole grains, and legumes are the foundations of the Pritikin Eating Plan. Recommendations include how to incorporate each of these in flavorful and healthy salads, and how to create homemade salad dressings. Proper handling of ingredients is also covered. Cooking - Soups and [employer] - Soups and Desserts Clinical staff conducted group or individual video education with verbal and written material and guidebook.  Patient learns that Pritikin soups and desserts make for easy, nutritious, and delicious snacks and meal components that are low in sodium, fat, sugar, and calorie  density, while high in vitamins, minerals, and filling fiber. Recommendations include simple and healthy ideas for soups and desserts.   Overview     The Pritikin Solution Program Overview Clinical staff conducted group or individual video education with verbal and written material and guidebook.  Patient learns that the results of the Pritikin Program have been documented in more than 100 articles published in peer-reviewed journals, and the benefits include reducing risk factors for (and, in some cases, even reversing) high cholesterol, high blood pressure, type 2 diabetes, obesity, and more! An overview of the three key pillars of the Pritikin Program will be covered: eating well, doing regular exercise, and having a healthy mind-set.  WORKSHOPS  Exercise: Exercise Basics: Building Your Action Plan Clinical staff led group instruction and group discussion with PowerPoint presentation and  patient guidebook. To enhance the learning environment the use of posters, models and videos may be added. At the conclusion of this workshop, patients will comprehend the difference between physical activity and exercise, as well as the benefits of incorporating both, into their routine. Patients will understand the FITT (Frequency, Intensity, Time, and Type) principle and how to use it to build an exercise action plan. In addition, safety concerns and other considerations for exercise and cardiac rehab will be addressed by the presenter. The purpose of this lesson is to promote a comprehensive and effective weekly exercise routine in order to improve patients' overall level of fitness.   Managing Heart Disease: Your Path to a Healthier Heart Clinical staff led group instruction and group discussion with PowerPoint presentation and patient guidebook. To enhance the learning environment the use of posters, models and videos may be added.At the conclusion of this workshop, patients will understand the  anatomy and physiology of the heart. Additionally, they will understand how Pritikin's three pillars impact the risk factors, the progression, and the management of heart disease.  The purpose of this lesson is to provide a high-level overview of the heart, heart disease, and how the Pritikin lifestyle positively impacts risk factors.  Exercise Biomechanics Clinical staff led group instruction and group discussion with PowerPoint presentation and patient guidebook. To enhance the learning environment the use of posters, models and videos may be added. Patients will learn how the structural parts of their bodies function and how these functions impact their daily activities, movement, and exercise. Patients will learn how to promote a neutral spine, learn how to manage pain, and identify ways to improve their physical movement in order to promote healthy living. The purpose of this lesson is to expose patients to common physical limitations that impact physical activity. Participants will learn practical ways to adapt and manage aches and pains, and to minimize their effect on regular exercise. Patients will learn how to maintain good posture while sitting, walking, and lifting.  Balance Training and Fall Prevention  Clinical staff led group instruction and group discussion with PowerPoint presentation and patient guidebook. To enhance the learning environment the use of posters, models and videos may be added. At the conclusion of this workshop, patients will understand the importance of their sensorimotor skills (vision, proprioception, and the vestibular system) in maintaining their ability to balance as they age. Patients will apply a variety of balancing exercises that are appropriate for their current level of function. Patients will understand the common causes for poor balance, possible solutions to these problems, and ways to modify their physical environment in order to minimize their  fall risk. The purpose of this lesson is to teach patients about the importance of maintaining balance as they age and ways to minimize their risk of falling.  WORKSHOPS   Nutrition:  Fueling a Ship broker led group instruction and group discussion with PowerPoint presentation and patient guidebook. To enhance the learning environment the use of posters, models and videos may be added. Patients will review the foundational principles of the Pritikin Eating Plan and understand what constitutes a serving size in each of the food groups. Patients will also learn Pritikin-friendly foods that are better choices when away from home and review make-ahead meal and snack options. Calorie density will be reviewed and applied to three nutrition priorities: weight maintenance, weight loss, and weight gain. The purpose of this lesson is to reinforce (in a group setting) the key concepts around what patients  are recommended to eat and how to apply these guidelines when away from home by planning and selecting Pritikin-friendly options. Patients will understand how calorie density may be adjusted for different weight management goals.  Mindful Eating  Clinical staff led group instruction and group discussion with PowerPoint presentation and patient guidebook. To enhance the learning environment the use of posters, models and videos may be added. Patients will briefly review the concepts of the Pritikin Eating Plan and the importance of low-calorie dense foods. The concept of mindful eating will be introduced as well as the importance of paying attention to internal hunger signals. Triggers for non-hunger eating and techniques for dealing with triggers will be explored. The purpose of this lesson is to provide patients with the opportunity to review the basic principles of the Pritikin Eating Plan, discuss the value of eating mindfully and how to measure internal cues of hunger and fullness using the  Hunger Scale. Patients will also discuss reasons for non-hunger eating and learn strategies to use for controlling emotional eating.  Targeting Your Nutrition Priorities Clinical staff led group instruction and group discussion with PowerPoint presentation and patient guidebook. To enhance the learning environment the use of posters, models and videos may be added. Patients will learn how to determine their genetic susceptibility to disease by reviewing their family history. Patients will gain insight into the importance of diet as part of an overall healthy lifestyle in mitigating the impact of genetics and other environmental insults. The purpose of this lesson is to provide patients with the opportunity to assess their personal nutrition priorities by looking at their family history, their own health history and current risk factors. Patients will also be able to discuss ways of prioritizing and modifying the Pritikin Eating Plan for their highest risk areas  Menu  Clinical staff led group instruction and group discussion with PowerPoint presentation and patient guidebook. To enhance the learning environment the use of posters, models and videos may be added. Using menus brought in from [employer], or printed from [employer], patients will apply the Pritikin dining out guidelines that were presented in the [employer] video. Patients will also be able to practice these guidelines in a variety of provided scenarios. The purpose of this lesson is to provide patients with the opportunity to practice hands-on learning of the Pritikin Dining Out guidelines with actual menus and practice scenarios.  Label Reading Clinical staff led group instruction and group discussion with PowerPoint presentation and patient guidebook. To enhance the learning environment the use of posters, models and videos may be added. Patients will review and discuss the Pritikin label reading guidelines  presented in Pritikin's Label Reading Educational series video. Using fool labels brought in from local grocery stores and markets, patients will apply the label reading guidelines and determine if the packaged food meet the Pritikin guidelines. The purpose of this lesson is to provide patients with the opportunity to review, discuss, and practice hands-on learning of the Pritikin Label Reading guidelines with actual packaged food labels. Cooking School  Pritikin's [employer] are designed to teach patients ways to prepare quick, simple, and affordable recipes at home. The importance of nutrition's role in chronic disease risk reduction is reflected in its emphasis in the overall Pritikin program. By learning how to prepare essential core Pritikin Eating Plan recipes, patients will increase control over what they eat; be able to customize the flavor of foods without the use of added salt, sugar, or fat; and  improve the quality of the food they consume. By learning a set of core recipes which are easily assembled, quickly prepared, and affordable, patients are more likely to prepare more healthy foods at home. These workshops focus on convenient breakfasts, simple entres, side dishes, and desserts which can be prepared with minimal effort and are consistent with nutrition recommendations for cardiovascular risk reduction. Cooking [employer] are taught by a Armed forces logistics/support/administrative officer (RD) who has been trained by the [employer]. The chef or RD has a clear understanding of the importance of minimizing - if not completely eliminating - added fat, sugar, and sodium in recipes. Throughout the series of Cooking School Workshop sessions, patients will learn about healthy ingredients and efficient methods of cooking to build confidence in their capability to prepare    Cooking School weekly topics:  Adding Flavor- Sodium-Free  Fast and Healthy Breakfasts  Powerhouse Plant-Based  Proteins  Satisfying Salads and Dressings  Simple Sides and Sauces  International Cuisine-Spotlight on the [employer] Zones  Delicious Desserts  Savory Soups  [employer] - Meals in a Astronomer Appetizers and Snacks  Comforting Weekend Breakfasts  One-Pot Wonders   Fast Evening Meals  Landscape architect Your Pritikin Plate  WORKSHOPS   Healthy Mindset (Psychosocial):  Focused Goals, Sustainable Changes Clinical staff led group instruction and group discussion with PowerPoint presentation and patient guidebook. To enhance the learning environment the use of posters, models and videos may be added. Patients will be able to apply effective goal setting strategies to establish at least one personal goal, and then take consistent, meaningful action toward that goal. They will learn to identify common barriers to achieving personal goals and develop strategies to overcome them. Patients will also gain an understanding of how our mind-set can impact our ability to achieve goals and the importance of cultivating a positive and growth-oriented mind-set. The purpose of this lesson is to provide patients with a deeper understanding of how to set and achieve personal goals, as well as the tools and strategies needed to overcome common obstacles which may arise along the way.  From Head to Heart: The Power of a Healthy Outlook  Clinical staff led group instruction and group discussion with PowerPoint presentation and patient guidebook. To enhance the learning environment the use of posters, models and videos may be added. Patients will be able to recognize and describe the impact of emotions and mood on physical health. They will discover the importance of self-care and explore self-care practices which may work for them. Patients will also learn how to utilize the 4 C's to cultivate a healthier outlook and better manage stress and challenges. The purpose of this lesson is to demonstrate  to patients how a healthy outlook is an essential part of maintaining good health, especially as they continue their cardiac rehab journey.  Healthy Sleep for a Healthy Heart Clinical staff led group instruction and group discussion with PowerPoint presentation and patient guidebook. To enhance the learning environment the use of posters, models and videos may be added. At the conclusion of this workshop, patients will be able to demonstrate knowledge of the importance of sleep to overall health, well-being, and quality of life. They will understand the symptoms of, and treatments for, common sleep disorders. Patients will also be able to identify daytime and nighttime behaviors which impact sleep, and they will be able to apply these tools to help manage sleep-related challenges. The purpose of this lesson is to  provide patients with a general overview of sleep and outline the importance of quality sleep. Patients will learn about a few of the most common sleep disorders. Patients will also be introduced to the concept of "sleep hygiene," and discover ways to self-manage certain sleeping problems through simple daily behavior changes. Finally, the workshop will motivate patients by clarifying the links between quality sleep and their goals of heart-healthy living.   Recognizing and Reducing Stress Clinical staff led group instruction and group discussion with PowerPoint presentation and patient guidebook. To enhance the learning environment the use of posters, models and videos may be added. At the conclusion of this workshop, patients will be able to understand the types of stress reactions, differentiate between acute and chronic stress, and recognize the impact that chronic stress has on their health. They will also be able to apply different coping mechanisms, such as reframing negative self-talk. Patients will have the opportunity to practice a variety of stress management techniques, such as deep  abdominal breathing, progressive muscle relaxation, and/or guided imagery.  The purpose of this lesson is to educate patients on the role of stress in their lives and to provide healthy techniques for coping with it.  Learning Barriers/Preferences:  Learning Barriers/Preferences - 11/13/22 1610       Learning Barriers/Preferences   Learning Barriers None    Learning Preferences Group Instruction;Individual Instruction;Skilled Demonstration;Written Material;Pictoral             Education Topics:  Knowledge Questionnaire Score:  Knowledge Questionnaire Score - 11/13/22 0840       Knowledge Questionnaire Score   Pre Score 21/24             Core Components/Risk Factors/Patient Goals at Admission:  Personal Goals and Risk Factors at Admission - 11/13/22 0821       Core Components/Risk Factors/Patient Goals on Admission    Weight Management Yes;Weight Maintenance    Intervention Weight Management: Develop a combined nutrition and exercise program designed to reach desired caloric intake, while maintaining appropriate intake of nutrient and fiber, sodium and fats, and appropriate energy expenditure required for the weight goal.;Weight Management: Provide education and appropriate resources to help participant work on and attain dietary goals.    Expected Outcomes Short Term: Continue to assess and modify interventions until short term weight is achieved;Weight Maintenance: Understanding of the daily nutrition guidelines, which includes 25-35% calories from fat, 7% or less cal from saturated fats, less than 200mg  cholesterol, less than 1.5gm of sodium, & 5 or more servings of fruits and vegetables daily;Understanding recommendations for meals to include 15-35% energy as protein, 25-35% energy from fat, 35-60% energy from carbohydrates, less than 200mg  of dietary cholesterol, 20-35 gm of total fiber daily;Understanding of distribution of calorie intake throughout the day with the  consumption of 4-5 meals/snacks    Heart Failure Yes    Intervention Provide a combined exercise and nutrition program that is supplemented with education, support and counseling about heart failure. Directed toward relieving symptoms such as shortness of breath, decreased exercise tolerance, and extremity edema.    Expected Outcomes Improve functional capacity of life;Short term: Attendance in program 2-3 days a week with increased exercise capacity. Reported lower sodium intake. Reported increased fruit and vegetable intake. Reports medication compliance.;Short term: Daily weights obtained and reported for increase. Utilizing diuretic protocols set by physician.;Long term: Adoption of self-care skills and reduction of barriers for early signs and symptoms recognition and intervention leading to self-care maintenance.    Hypertension Yes  Intervention Provide education on lifestyle modifcations including regular physical activity/exercise, weight management, moderate sodium restriction and increased consumption of fresh fruit, vegetables, and low fat dairy, alcohol moderation, and smoking cessation.;Monitor prescription use compliance.    Expected Outcomes Short Term: Continued assessment and intervention until BP is < 140/52mm HG in hypertensive participants. < 130/45mm HG in hypertensive participants with diabetes, heart failure or chronic kidney disease.;Long Term: Maintenance of blood pressure at goal levels.    Lipids Yes    Intervention Provide education and support for participant on nutrition & aerobic/resistive exercise along with prescribed medications to achieve LDL 70mg , HDL >40mg .    Expected Outcomes Short Term: Participant states understanding of desired cholesterol values and is compliant with medications prescribed. Participant is following exercise prescription and nutrition guidelines.;Long Term: Cholesterol controlled with medications as prescribed, with individualized exercise RX  and with personalized nutrition plan. Value goals: LDL < 70mg , HDL > 40 mg.             Core Components/Risk Factors/Patient Goals Review:   Goals and Risk Factor Review     Row Name 11/21/22 1437 12/23/22 1640 01/20/23 1343 02/17/23 1514       Core Components/Risk Factors/Patient Goals Review   Personal Goals Review Weight Management/Obesity;Heart Failure;Hypertension;Lipids Weight Management/Obesity;Heart Failure;Hypertension;Lipids Weight Management/Obesity;Heart Failure;Hypertension;Lipids Weight Management/Obesity;Heart Failure;Hypertension;Lipids    Review Beth Simon started cardiac rehab on 11/21/22 and did well with exercise. Vital signs were stable. Beth Simon is doing well well with exercise at cardiac rehab. . Vital signs have been  stable. Beth Simon is doing well well with exercise at cardiac rehab. . Vital signs have been  stable. Beth Simon fell on 01/17/23 and will be out of cardiac rehab the rest of the week Beth Simon is doing well well with exercise at cardiac rehab. . Vital signs have been  stable. Beth Simon has returned to exercise at cardiac rehab post fall. Beth Simon has lost 1.9 kg since starting cardiac rehab.    Expected Outcomes Haleah will continue to participate in cardiac rehab for exercise, nutrition and lifestyle modifications Taitiana will continue to participate in cardiac rehab for exercise, nutrition and lifestyle modifications Mareesa will continue to participate in cardiac rehab for exercise, nutrition and lifestyle modifications Aliyaah will continue to participate in cardiac rehab for exercise, nutrition and lifestyle modifications             Core Components/Risk Factors/Patient Goals at Discharge (Final Review):   Goals and Risk Factor Review - 02/17/23 1514       Core Components/Risk Factors/Patient Goals Review   Personal Goals Review Weight Management/Obesity;Heart Failure;Hypertension;Lipids    Review Beth Simon is doing well well with exercise at cardiac rehab.  . Vital signs have been  stable. Beth Simon has returned to exercise at cardiac rehab post fall. Beth Simon has lost 1.9 kg since starting cardiac rehab.    Expected Outcomes Hawanya will continue to participate in cardiac rehab for exercise, nutrition and lifestyle modifications             ITP Comments:  ITP Comments     Row Name 11/13/22 0805 11/21/22 1433 12/23/22 1638 01/20/23 1341 02/17/23 1512   ITP Comments Armanda Magic, MD: Medical Director.  Introduction to the [facility] Program/Intensive Cardiac Rehab.  Initial orientation packet reviewed with the patient. 30 day ITP review. Dannica started cardiac rehab on 11/21/22 and did well with exercise. 30 day ITP review. Nusaybah has good attendance and participaiton  with exercise at cardiac rehab. 30 day ITP review. Ariyana has good attendance and  participaiton  with exercise at cardiac rehab. Lissie fell near her home on 01/17/23 and will be out of exercise the rest of the week 30 day ITP review. Nalanie has good attendance and participaiton  with exercise at cardiac rehab. Kelisa has returned to exercise post fall            Comments:  See ITP comments. Paizly will complete cardiac rehab on 02/25/23.Thayer Headings RN BSN

## 2023-02-18 ENCOUNTER — Encounter (HOSPITAL_COMMUNITY)
Admission: RE | Admit: 2023-02-18 | Discharge: 2023-02-18 | Disposition: A | Discharge status: Home / Self Care | Payer: Medicare HMO | Source: Ambulatory Visit | Attending: Cardiology | Admitting: Cardiology

## 2023-02-18 DIAGNOSIS — I214 Non-ST elevation (NSTEMI) myocardial infarction: Secondary | ICD-10-CM

## 2023-02-18 DIAGNOSIS — Z48812 Encounter for surgical aftercare following surgery on the circulatory system: Secondary | ICD-10-CM | POA: Diagnosis not present

## 2023-02-18 LAB — CYTOLOGY - PAP
Comment: NEGATIVE
Diagnosis: NEGATIVE
High risk HPV: NEGATIVE

## 2023-02-19 ENCOUNTER — Encounter (INDEPENDENT_AMBULATORY_CARE_PROVIDER_SITE_OTHER): Payer: Self-pay | Admitting: Otolaryngology

## 2023-02-20 ENCOUNTER — Encounter (HOSPITAL_COMMUNITY)
Admission: RE | Admit: 2023-02-20 | Discharge: 2023-02-20 | Disposition: A | Discharge status: Home / Self Care | Payer: Medicare HMO | Source: Ambulatory Visit | Attending: Cardiology | Admitting: Cardiology

## 2023-02-20 VITALS — Ht 58.5 in | Wt 121.9 lb

## 2023-02-20 DIAGNOSIS — I214 Non-ST elevation (NSTEMI) myocardial infarction: Secondary | ICD-10-CM

## 2023-02-20 DIAGNOSIS — Z48812 Encounter for surgical aftercare following surgery on the circulatory system: Secondary | ICD-10-CM | POA: Diagnosis not present

## 2023-02-21 DIAGNOSIS — M545 Low back pain, unspecified: Secondary | ICD-10-CM | POA: Diagnosis not present

## 2023-02-23 DIAGNOSIS — R69 Illness, unspecified: Secondary | ICD-10-CM | POA: Diagnosis not present

## 2023-02-25 ENCOUNTER — Encounter (HOSPITAL_COMMUNITY)
Admission: RE | Admit: 2023-02-25 | Discharge: 2023-02-25 | Disposition: A | Discharge status: Home / Self Care | Payer: Medicare HMO | Source: Ambulatory Visit | Attending: Cardiology | Admitting: Cardiology

## 2023-02-25 DIAGNOSIS — I214 Non-ST elevation (NSTEMI) myocardial infarction: Secondary | ICD-10-CM

## 2023-02-25 DIAGNOSIS — Z48812 Encounter for surgical aftercare following surgery on the circulatory system: Secondary | ICD-10-CM | POA: Diagnosis not present

## 2023-02-25 NOTE — Progress Notes (Signed)
Discharge Progress Report  Patient Details  Name: Beth Simon MRN: [id number] Date of Birth: Feb 18, 1941 Referring Provider:   Flowsheet Row INTENSIVE CARDIAC REHAB ORIENT from 11/13/2022 in [facility]  Referring Provider Peter Swazil[place], MD        Number of Visits: 47  Reason for Discharge:  Patient reached a stable level of exercise. Patient independent in their exercise. Patient has met program and personal goals.  Smoking History:  Social History   Tobacco Use  Smoking Status Never  Smokeless Tobacco Never    Diagnosis:  10/17/22 NSTEMI (non-ST elevated myocardial infarction) [facility])  ADL UCSD:   Initial Exercise Prescription:  Initial Exercise Prescription - 11/13/22 1100       Date of Initial Exercise RX and Referring Provider   Date 11/13/22    Referring Provider Peter Swaziland, MD    Expected Discharge Date 02/04/23      NuStep   Level 1    SPM 60    Minutes 15    METs 1.8      Track   Laps 15    Minutes 15    METs 2      Prescription Details   Frequency (times per week) 3    Duration Progress to 30 minutes of continuous aerobic without signs/symptoms of physical distress      Intensity   THRR 40-80% of Max Heartrate 55-110    Ratings of Perceived Exertion 11-13    Perceived Dyspnea 0-4      Progression   Progression Continue progressive overload as per policy without signs/symptoms or physical distress.      Resistance Training   Training Prescription Yes    Weight 2    Reps 10-15             Discharge Exercise Prescription (Final Exercise Prescription Changes):  Exercise Prescription Changes - 02/25/23 1400       Response to Exercise   Blood Pressure (Admit) 100/78    Blood Pressure (Exit) 140/80    Heart Rate (Admit) 65 bpm    Heart Rate (Exercise) 113 bpm    Heart Rate (Exit) 76 bpm    Rating of Perceived Exertion (Exercise) 11    Symptoms None    Comments Pt  graduated from the CRP2 program today    Duration Continue with 30 min of aerobic exercise without signs/symptoms of physical distress.    Intensity THRR unchanged      Progression   Progression Continue to progress workloads to maintain intensity without signs/symptoms of physical distress.    Average METs 4      Resistance Training   Training Prescription No    Weight No weights on wednesdays      Interval Training   Interval Training No      Track   Laps 47    Minutes 30    METs 4.19      Home Exercise Plan   Plans to continue exercise at Home (comment)    Frequency Add 2 additional days to program exercise sessions.    Initial Home Exercises Provided 01/09/23             Functional Capacity:  6 Minute Walk     Row Name 11/13/22 1145 02/18/23 1253       6 Minute Walk   Phase Initial Discharge    Distance 480 feet 2040 feet    Distance % Change -- 325 %  Distance Feet Change -- 1560 ft    Walk Time 6 minutes 6 minutes    # of Rest Breaks 1  2:00-6:00 0    MPH 0.91 3.9    METS 0.83 3.7    RPE 13 11    Perceived Dyspnea  0 0    VO2 Peak 2.91 12.8    Symptoms Yes (comment) No    Comments Chest pressure reported at 2 min mark. Test aborted, BP 190/72, 98% NSR. No other symptoms reported, pressure resolved once seated. RN Corrie Dandy made aware, contacted Dr. Swaziland. --    Resting HR 62 bpm 62 bpm    Resting BP 148/72 142/60    Resting Oxygen Saturation  97 % --    Exercise Oxygen Saturation  during 6 min walk 98 % --    Max Ex. HR 82 bpm 103 bpm    Max Ex. BP 190/72 160/80    2 Minute Post BP 188/70 --             Psychological, QOL, Others - Outcomes: PHQ 2/9:    02/25/2023    1:30 PM 11/13/2022   11:45 AM 10/16/2017    2:11 PM 10/10/2016    2:52 PM 10/05/2015   11:10 AM  Depression screen PHQ 2/9  Decreased Interest 0 1 0 0 0  Down, Depressed, Hopeless 0 0 0 0 0  PHQ - 2 Score 0 1 0 0 0  Altered sleeping 0 0     Tired, decreased energy 1 1      Change in appetite 0 1     Feeling bad or failure about yourself  0 0     Trouble concentrating 0 0     Moving slowly or fidgety/restless 0 0     Suicidal thoughts 0 0     PHQ-9 Score 1 3     Difficult doing work/chores Not difficult at all Not difficult at all       Quality of Life:  Quality of Life - 02/20/23 1500       Quality of Life Scores   Health/Function Pre 28.18 %    Health/Function Post 28.21 %    Health/Function % Change 0.11 %    Socioeconomic Pre 27.75 %    Socioeconomic Post 27.75 %    Socioeconomic % Change  0 %    Psych/Spiritual Pre 28.93 %    Psych/Spiritual Post 28.93 %    Psych/Spiritual % Change 0 %    Family Pre 27 %    Family Post 27.75 %    Family % Change 2.78 %    GLOBAL Pre 28.11 %    GLOBAL Post 28.23 %    GLOBAL % Change 0.43 %             Personal Goals: Goals established at orientation with interventions provided to work toward goal.  Personal Goals and Risk Factors at Admission - 11/13/22 0821       Core Components/Risk Factors/Patient Goals on Admission    Weight Management Yes;Weight Maintenance    Intervention Weight Management: Develop a combined nutrition and exercise program designed to reach desired caloric intake, while maintaining appropriate intake of nutrient and fiber, sodium and fats, and appropriate energy expenditure required for the weight goal.;Weight Management: Provide education and appropriate resources to help participant work on and attain dietary goals.    Expected Outcomes Short Term: Continue to assess and modify interventions until short term weight is achieved;Weight Maintenance: Understanding  of the daily nutrition guidelines, which includes 25-35% calories from fat, 7% or less cal from saturated fats, less than 200mg  cholesterol, less than 1.5gm of sodium, & 5 or more servings of fruits and vegetables daily;Understanding recommendations for meals to include 15-35% energy as protein, 25-35% energy from fat,  35-60% energy from carbohydrates, less than 200mg  of dietary cholesterol, 20-35 gm of total fiber daily;Understanding of distribution of calorie intake throughout the day with the consumption of 4-5 meals/snacks    Heart Failure Yes    Intervention Provide a combined exercise and nutrition program that is supplemented with education, support and counseling about heart failure. Directed toward relieving symptoms such as shortness of breath, decreased exercise tolerance, and extremity edema.    Expected Outcomes Improve functional capacity of life;Short term: Attendance in program 2-3 days a week with increased exercise capacity. Reported lower sodium intake. Reported increased fruit and vegetable intake. Reports medication compliance.;Short term: Daily weights obtained and reported for increase. Utilizing diuretic protocols set by physician.;Long term: Adoption of self-care skills and reduction of barriers for early signs and symptoms recognition and intervention leading to self-care maintenance.    Hypertension Yes    Intervention Provide education on lifestyle modifcations including regular physical activity/exercise, weight management, moderate sodium restriction and increased consumption of fresh fruit, vegetables, and low fat dairy, alcohol moderation, and smoking cessation.;Monitor prescription use compliance.    Expected Outcomes Short Term: Continued assessment and intervention until BP is < 140/46mm HG in hypertensive participants. < 130/49mm HG in hypertensive participants with diabetes, heart failure or chronic kidney disease.;Long Term: Maintenance of blood pressure at goal levels.    Lipids Yes    Intervention Provide education and support for participant on nutrition & aerobic/resistive exercise along with prescribed medications to achieve LDL 70mg , HDL >40mg .    Expected Outcomes Short Term: Participant states understanding of desired cholesterol values and is compliant with medications  prescribed. Participant is following exercise prescription and nutrition guidelines.;Long Term: Cholesterol controlled with medications as prescribed, with individualized exercise RX and with personalized nutrition plan. Value goals: LDL < 70mg , HDL > 40 mg.              Personal Goals Discharge:  Goals and Risk Factor Review     Row Name 11/21/22 1437 12/23/22 1640 01/20/23 1343 02/17/23 1514 02/25/23 1332     Core Components/Risk Factors/Patient Goals Review   Personal Goals Review Weight Management/Obesity;Heart Failure;Hypertension;Lipids Weight Management/Obesity;Heart Failure;Hypertension;Lipids Weight Management/Obesity;Heart Failure;Hypertension;Lipids Weight Management/Obesity;Heart Failure;Hypertension;Lipids Weight Management/Obesity;Heart Failure;Hypertension;Lipids   Review Jazzmon started cardiac rehab on 11/21/22 and did well with exercise. Vital signs were stable. Joplin is doing well well with exercise at cardiac rehab. . Vital signs have been  stable. Edynn is doing well well with exercise at cardiac rehab. . Vital signs have been  stable. Solomia fell on 01/17/23 and will be out of cardiac rehab the rest of the week Teaghan is doing well well with exercise at cardiac rehab. . Vital signs have been  stable. Tanette has returned to exercise at cardiac rehab post fall. Swathi has lost 1.9 kg since starting cardiac rehab. Alyric is doing well well with exercise at cardiac rehab. . Vital signs have been  stable. Chrystyna has returned to exercise at cardiac rehab post fall. Alycea has lost 1.9 kg since starting cardiac rehab. Ziaire completed cardiac rehab on 02/25/23.   Expected Outcomes Chelita will continue to participate in cardiac rehab for exercise, nutrition and lifestyle modifications Krisna will continue to participate in cardiac  rehab for exercise, nutrition and lifestyle modifications Ahria will continue to participate in cardiac rehab for exercise, nutrition and  lifestyle modifications Ivanelle will continue to participate in cardiac rehab for exercise, nutrition and lifestyle modifications Mayleah will continue to participate in cardiac rehab for exercise, nutrition and lifestyle modifications            Exercise Goals and Review:  Exercise Goals     Row Name 11/13/22 0821             Exercise Goals   Increase Physical Activity Yes       Intervention Provide advice, education, support and counseling about physical activity/exercise needs.;Develop an individualized exercise prescription for aerobic and resistive training based on initial evaluation findings, risk stratification, comorbidities and participant's personal goals.       Expected Outcomes Short Term: Attend rehab on a regular basis to increase amount of physical activity.;Long Term: Exercising regularly at least 3-5 days a week.;Long Term: Add in home exercise to make exercise part of routine and to increase amount of physical activity.       Increase Strength and Stamina Yes       Intervention Provide advice, education, support and counseling about physical activity/exercise needs.;Develop an individualized exercise prescription for aerobic and resistive training based on initial evaluation findings, risk stratification, comorbidities and participant's personal goals.       Expected Outcomes Short Term: Perform resistance training exercises routinely during rehab and add in resistance training at home;Short Term: Increase workloads from initial exercise prescription for resistance, speed, and METs.;Long Term: Improve cardiorespiratory fitness, muscular endurance and strength as measured by increased METs and functional capacity ( )       Able to understand and use rate of perceived exertion (RPE) scale Yes       Intervention Provide education and explanation on how to use RPE scale       Expected Outcomes Short Term: Able to use RPE daily in rehab to express subjective intensity  level;Long Term:  Able to use RPE to guide intensity level when exercising independently       Knowledge and understanding of Target Heart Rate Range (THRR) Yes       Intervention Provide education and explanation of THRR including how the numbers were predicted and where they are located for reference       Expected Outcomes Short Term: Able to state/look up THRR;Short Term: Able to use daily as guideline for intensity in rehab;Long Term: Able to use THRR to govern intensity when exercising independently       Understanding of Exercise Prescription Yes       Intervention Provide education, explanation, and written materials on patient's individual exercise prescription       Expected Outcomes Short Term: Able to explain program exercise prescription;Long Term: Able to explain home exercise prescription to exercise independently                Exercise Goals Re-Evaluation:  Exercise Goals Re-Evaluation     Row Name 11/21/22 1401 12/19/22 1503 01/09/23 1500 02/25/23 1430       Exercise Goal Re-Evaluation   Exercise Goals Review Increase Physical Activity;Increase Strength and Stamina;Able to understand and use rate of perceived exertion (RPE) scale;Knowledge and understanding of Target Heart Rate Range (THRR);Understanding of Exercise Prescription -- Increase Physical Activity;Increase Strength and Stamina;Able to understand and use rate of perceived exertion (RPE) scale;Knowledge and understanding of Target Heart Rate Range (THRR);Understanding of Exercise Prescription Increase Physical Activity;Increase Strength and  Stamina;Able to understand and use rate of perceived exertion (RPE) scale;Knowledge and understanding of Target Heart Rate Range (THRR);Understanding of Exercise Prescription    Comments Pt's first day in the CRP2 program. Pt understands the THRR, RPE sclae and exercise Rx. Reviewed METs and Goals and discussed HEP with pt. Pt is proud of her exercise progress but admits she is not  yet independent in preparing meals for herself at home. She admits to buying [employer] ch[employer] and adding more vegetables to make it healthier. I asked pt to meet with our dietician to help her reach her goals.  Pt reports that she likes to walk but feels unsafe walking by herself. She does walk x1/week outdoors with her church group and attends a pilates class x1/week but wants to do more. She does not like going to the [facility], I will brainstorm and create a definitive plan with patient soon. Reviewed METs, goals and HERx. Pt voices that she is making good progress on her goal of increased strength and stamina. Pt is still working on her healthy cooking for one person. Pt walks with her group of friends and encouraged her to try to add another day of walking. Suggested pateint could go to a home improvement store and walk inside, and pt would feel safe. Will follow up on her progress in making a plan. Pt graduated from the CRP2 program today. Pt made good progress on her goals of increased strength and stamina. Pt is walking better and feeling better. Pt is still working on her diet and cooking. Pt did lose 5 lbs during the program. Pt had peak METs of 4.19, Pt plans to continue her exercise by walking at home.    Expected Outcomes Will continue to monitor patient and progress exercise workloads as tolerated. -- Will continue to monitor patient and progress exercise workloads as tolerated. Pt will continue her exercise by walking at home.             Nutrition & Weight - Outcomes:  Pre Biometrics - 11/13/22 0804       Pre Biometrics   Waist Circumference 34.5 inches    Hip Circumference 39 inches    Waist to Hip Ratio 0.88 %    Triceps Skinfold 26 mm    % Body Fat 39.6 %    Grip Strength 22 kg    Flexibility 12 in    Single Leg Stand 13.2 seconds             Post Biometrics - 02/20/23 1355        Post  Biometrics   Height 4' 10.5" (1.486 m)    Weight 55.3 kg    Waist Circumference 34.5  inches    Hip Circumference 38.5 inches    Waist to Hip Ratio 0.9 %    BMI (Calculated) 25.04    Triceps Skinfold 23 mm    % Body Fat 38.4 %    Grip Strength 20 kg    Flexibility 12 in    Single Leg Stand 30 seconds             Nutrition:  Nutrition Therapy & Goals - 02/17/23 1042       Nutrition Therapy   Diet Heart Healthy Diet    Drug/Food Interactions Statins/Certain Fruits      Personal Nutrition Goals   Nutrition Goal Patient to identify strategies for reducing cardiovascular risk by attending the [facility] education and nutrition series weekly.   goal in action.  Personal Goal #2 Patient to improve diet quality by using the plate method as a guide for meal planning to include lean protein/plant protein, fruits, vegetables, whole grains, nonfat dairy as part of a well-balanced diet   goal in progress.   Personal Goal #3 Patient to decrease sodium 1500mg  per day   goal in progress.   Comments Goals in progress. Giavana continues to attend the [employer] and nutrition series regularly. Prior to starting cardiac rehab, Shatona reports that she has already started making some dietary changes including eating three meals daily and reduced sugar/refined carbohydrate intake. She has started cooking more using her crockpot and is implementing more vegetables and whole grains. She reports weight loss of ~10# since initial NSTEMI hospitalization on 7/19 at 132#. She is motivated to continue to lose weight with goal weight of ~115#. She is down 4.2# since starting with our program. Her LDL has improved to <100 (76). Patient will benefit from participation in intensive cardiac rehab for nutrition, exercise, and lifestyle modification.      Intervention Plan   Intervention Prescribe, educate and counsel regarding individualized specific dietary modifications aiming towards targeted core components such as weight, hypertension, lipid management, diabetes, heart failure and other  comorbidities.;Nutrition handout(s) given to patient.    Expected Outcomes Short Term Goal: Understand basic principles of dietary content, such as calories, fat, sodium, cholesterol and nutrients.;Long Term Goal: Adherence to prescribed nutrition plan.             Nutrition Discharge:  Nutrition Assessments - 11/20/22 1448       Rate Your Plate Scores   Pre Score 76             Education Questionnaire Score:  Knowledge Questionnaire Score - 02/20/23 1500       Knowledge Questionnaire Score   Post Score 20/24             Goals reviewed with patient; copy given to patient.Pt graduates from  Intensive/Traditional cardiac rehab program on 02/25/23. with completion of  23 exercise and 24 education sessions. Pt maintained good attendance and progressed nicely during their participation in rehab as evidenced by increased MET level. Laisa increased her distance by 1560 feet. Marni lost 2.2 kg while enrolled in the program.  Medication list reconciled. Repeat  PHQ score-  1.  Pt has made significant lifestyle changes and should be commended for their success. Sneha achieved their goals during cardiac rehab.   Pt plans to continue exercise at home walking in her town [place] community. Trystan says she feels like she has met her goals and that her leg's feel stronger. Leandria says she feels confident that she can walk independently now. We are proud of Lindi's progress!Thayer Headings RN BSN

## 2023-03-02 DIAGNOSIS — R69 Illness, unspecified: Secondary | ICD-10-CM | POA: Diagnosis not present

## 2023-03-03 ENCOUNTER — Ambulatory Visit: Payer: Medicare HMO | Admitting: Podiatry

## 2023-03-03 ENCOUNTER — Encounter: Payer: Self-pay | Admitting: Podiatry

## 2023-03-03 DIAGNOSIS — B351 Tinea unguium: Secondary | ICD-10-CM

## 2023-03-03 DIAGNOSIS — Z7901 Long term (current) use of anticoagulants: Secondary | ICD-10-CM

## 2023-03-03 DIAGNOSIS — M79676 Pain in unspecified toe(s): Secondary | ICD-10-CM

## 2023-03-03 DIAGNOSIS — L84 Corns and callosities: Secondary | ICD-10-CM

## 2023-03-04 NOTE — Progress Notes (Signed)
Subjective: Chief Complaint  Patient presents with   RFC    RM#13 nail trim/callus    82 y.o. returns the office today for painful, elongated, thickened toenails which she is unable to trim herself.  States the nails get painful to get elongated.  She does have hammertoes which resulted in a painful callus that will hurt at times.  She is now on Plavix.   Objective: AAO 3, NAD DP/PT pulses palpable, CRT less than 3 seconds Nails hypertrophic, dystrophic, elongated, brittle, discolored 10.  Incurvation of the nails most on the right second toenail with localized edema.  There is no drainage or pus today.  There is tenderness overlying the nails 1-5 bilaterally.  No signs of infection.  Ingrown toenails present to multiple toenails. There is hyperkeratotic lesions of the distal aspect left second digit. No underlying ulceration, drainage or other signs of infection.  Hammertoes present No pain with calf compression, warmth, erythema.  Assessment: Symptomatic onychomycosis, ingrown toenails; hyperkeratotic lesion left foot due to hammertoe deformity  Plan: -Treatment options including alternatives, risks, complications were discussed -Nails sharply debrided 10 without any complications or bleeding.  Monitor ingrown toenails. -Hyperkeratotic lesion sharply debrided 1 without complication/bleeding  Vivi Barrack DPM

## 2023-03-05 ENCOUNTER — Ambulatory Visit (HOSPITAL_COMMUNITY): Payer: Medicare HMO | Attending: Internal Medicine

## 2023-03-05 DIAGNOSIS — E785 Hyperlipidemia, unspecified: Secondary | ICD-10-CM | POA: Diagnosis not present

## 2023-03-05 DIAGNOSIS — I10 Essential (primary) hypertension: Secondary | ICD-10-CM | POA: Diagnosis not present

## 2023-03-05 DIAGNOSIS — I5042 Chronic combined systolic (congestive) and diastolic (congestive) heart failure: Secondary | ICD-10-CM | POA: Insufficient documentation

## 2023-03-05 DIAGNOSIS — I2510 Atherosclerotic heart disease of native coronary artery without angina pectoris: Secondary | ICD-10-CM | POA: Diagnosis not present

## 2023-03-05 LAB — ECHOCARDIOGRAM COMPLETE
Area-P 1/2: 3.26 cm2
S' Lateral: 2.37 cm

## 2023-03-09 ENCOUNTER — Telehealth: Payer: Self-pay | Admitting: Cardiology

## 2023-03-09 DIAGNOSIS — R69 Illness, unspecified: Secondary | ICD-10-CM | POA: Diagnosis not present

## 2023-03-09 NOTE — Telephone Encounter (Signed)
Called and spoke with patient in regards to her question regarding labs. At this time no labs are needed before her appointment with Dr.Jordan

## 2023-03-09 NOTE — Telephone Encounter (Signed)
Calling to see if she needs to have labs done. Please advise

## 2023-03-16 DIAGNOSIS — R69 Illness, unspecified: Secondary | ICD-10-CM | POA: Diagnosis not present

## 2023-03-20 ENCOUNTER — Institutional Professional Consult (permissible substitution) (INDEPENDENT_AMBULATORY_CARE_PROVIDER_SITE_OTHER): Payer: Self-pay

## 2023-03-23 DIAGNOSIS — R69 Illness, unspecified: Secondary | ICD-10-CM | POA: Diagnosis not present

## 2023-03-30 ENCOUNTER — Other Ambulatory Visit: Payer: Self-pay

## 2023-03-30 ENCOUNTER — Emergency Department (HOSPITAL_COMMUNITY): Payer: Medicare HMO

## 2023-03-30 ENCOUNTER — Emergency Department (HOSPITAL_COMMUNITY)
Admission: EM | Admit: 2023-03-30 | Discharge: 2023-03-31 | Disposition: A | Discharge status: Home / Self Care | Payer: Medicare HMO | Attending: Emergency Medicine | Admitting: Emergency Medicine

## 2023-03-30 DIAGNOSIS — R778 Other specified abnormalities of plasma proteins: Secondary | ICD-10-CM | POA: Diagnosis not present

## 2023-03-30 DIAGNOSIS — I10 Essential (primary) hypertension: Secondary | ICD-10-CM | POA: Insufficient documentation

## 2023-03-30 DIAGNOSIS — R9431 Abnormal electrocardiogram [ECG] [EKG]: Secondary | ICD-10-CM | POA: Diagnosis not present

## 2023-03-30 DIAGNOSIS — I4510 Unspecified right bundle-branch block: Secondary | ICD-10-CM | POA: Diagnosis not present

## 2023-03-30 DIAGNOSIS — I509 Heart failure, unspecified: Secondary | ICD-10-CM | POA: Diagnosis not present

## 2023-03-30 DIAGNOSIS — I2510 Atherosclerotic heart disease of native coronary artery without angina pectoris: Secondary | ICD-10-CM | POA: Diagnosis not present

## 2023-03-30 DIAGNOSIS — Z743 Need for continuous supervision: Secondary | ICD-10-CM | POA: Diagnosis not present

## 2023-03-30 DIAGNOSIS — Z853 Personal history of malignant neoplasm of breast: Secondary | ICD-10-CM | POA: Insufficient documentation

## 2023-03-30 DIAGNOSIS — R5381 Other malaise: Secondary | ICD-10-CM | POA: Diagnosis not present

## 2023-03-30 DIAGNOSIS — R079 Chest pain, unspecified: Secondary | ICD-10-CM | POA: Diagnosis not present

## 2023-03-30 DIAGNOSIS — Z8541 Personal history of malignant neoplasm of cervix uteri: Secondary | ICD-10-CM | POA: Insufficient documentation

## 2023-03-30 DIAGNOSIS — Z20822 Contact with and (suspected) exposure to covid-19: Secondary | ICD-10-CM | POA: Insufficient documentation

## 2023-03-30 DIAGNOSIS — I110 Hypertensive heart disease with heart failure: Secondary | ICD-10-CM | POA: Insufficient documentation

## 2023-03-30 DIAGNOSIS — R69 Illness, unspecified: Secondary | ICD-10-CM | POA: Diagnosis not present

## 2023-03-30 DIAGNOSIS — R531 Weakness: Secondary | ICD-10-CM | POA: Diagnosis not present

## 2023-03-30 LAB — CBC WITH DIFFERENTIAL/PLATELET
Abs Immature Granulocytes: 0.04 10*3/uL (ref 0.00–0.07)
Basophils Absolute: 0.1 10*3/uL (ref 0.0–0.1)
Basophils Relative: 1 %
Eosinophils Absolute: 0.5 10*3/uL (ref 0.0–0.5)
Eosinophils Relative: 4 %
HCT: 39.9 % (ref 36.0–46.0)
Hemoglobin: 13.5 g/dL (ref 12.0–15.0)
Immature Granulocytes: 0 %
Lymphocytes Relative: 25 %
Lymphs Abs: 2.5 10*3/uL (ref 0.7–4.0)
MCH: 30.1 pg (ref 26.0–34.0)
MCHC: 33.8 g/dL (ref 30.0–36.0)
MCV: 88.9 fL (ref 80.0–100.0)
Monocytes Absolute: 0.8 10*3/uL (ref 0.1–1.0)
Monocytes Relative: 8 %
Neutro Abs: 6.4 10*3/uL (ref 1.7–7.7)
Neutrophils Relative %: 62 %
Platelets: 283 10*3/uL (ref 150–400)
RBC: 4.49 MIL/uL (ref 3.87–5.11)
RDW: 12.2 % (ref 11.5–15.5)
WBC: 10.3 10*3/uL (ref 4.0–10.5)
nRBC: 0 % (ref 0.0–0.2)

## 2023-03-30 LAB — BASIC METABOLIC PANEL
Anion gap: 8 (ref 5–15)
BUN: 10 mg/dL (ref 8–23)
CO2: 22 mmol/L (ref 22–32)
Calcium: 9.1 mg/dL (ref 8.9–10.3)
Chloride: 104 mmol/L (ref 98–111)
Creatinine, Ser: 0.74 mg/dL (ref 0.44–1.00)
GFR, Estimated: >60 mL/min (ref >60)
Glucose, Bld: 106 mg/dL — ABNORMAL HIGH (ref 70–99)
Potassium: 4.1 mmol/L (ref 3.5–5.1)
Sodium: 134 mmol/L — ABNORMAL LOW (ref 135–145)

## 2023-03-30 LAB — RESP PANEL BY RT-PCR (RSV, FLU A&B, COVID)  RVPGX2
Influenza A by PCR: NEGATIVE
Influenza B by PCR: NEGATIVE
Resp Syncytial Virus by PCR: NEGATIVE
SARS Coronavirus 2 by RT PCR: NEGATIVE

## 2023-03-30 LAB — LIPASE, BLOOD: Lipase: 35 U/L (ref 11–51)

## 2023-03-30 LAB — TROPONIN I (HIGH SENSITIVITY): Troponin I (High Sensitivity): 25 ng/L — ABNORMAL HIGH (ref <18)

## 2023-03-30 NOTE — ED Triage Notes (Signed)
BIB EMS From home elevated BP today and weakness in legs. Chest pain prior to EMS arrival. Pain subsided on its own without medication

## 2023-03-30 NOTE — ED Provider Triage Note (Signed)
Emergency Medicine Provider Triage Evaluation Note  Beth Simon , a 82 y.o. female  was evaluated in triage.  Pt complains of chest pain. HX CAD/CHF. Chest pain at 1200 today. Intermittent. Not present at the moment. Higher than normal BP for patient.  Review of Systems  Positive: CP Negative:   Physical Exam  BP (!) 197/85   Pulse 69   Temp 98 F (36.7 C) (Oral)   Resp 18   LMP 03/31/1988 (Approximate)   SpO2 97%  Gen:   Awake, no distress   Resp:  Normal effort  MSK:   Moves extremities without difficulty  Other:    Medical Decision Making  Medically screening exam initiated at 4:18 PM.  Appropriate orders placed.  AUSTINE HACKLER was informed that the remainder of the evaluation will be completed by another provider, this initial triage assessment does not replace that evaluation, and the importance of remaining in the ED until their evaluation is complete.     Glyn Ade, MD 03/30/23 1620

## 2023-03-30 NOTE — Progress Notes (Signed)
 Cardiology Office Note:    Date:  04/06/2023   ID:  Beth Simon, Beth Simon [birth year], MRN [id number]  PCP:  Dwight Trula SQUIBB, MD  Cardiologist:  Zanai Mallari, MD     Referring MD: Dwight Trula SQUIBB, MD   Chief Complaint: seen for follow up CAD.   History of Present Illness:    Beth Simon is a 82 y.o. female with a history of CAD with NSTEMI on 09/2022 (cath showed diffuse disease but anatomy not favorable for PCI so treated medically), chronic combined CHF with EF of 30-35% on Echo in 09/2022, hypertension, hyperlipidemia, anemia, breast cancer s/p chemo and mastectomy, and uterine cancer s/p radiation and hysterectomy.   Patient was first seen by Cardiology during an admission in 09/2022 for NSTEMI after presenting with palpitations, uncontrolled BP, and mild chest discomfort. High-sensitivity troponin  53 >> 2,054. Echo showed LVEF of 30-35% with akinesis of the distal 2/3 of the LV with apical ballooning (suggestive of possible stress-induced cardiomyopathy vs LAD ischemia) as well as grade 2 diastolic dysfunction and mild MR. R/LHC showed showed 45% stenosis of proximal to mid LAD at major 1st Diag followed by 3 tandem 70%, 70%, and 85% stenoses of mid LAD with diffuse moderate diease throughout as well as 60% stenosis of the 1st Diag and 90% stenosis of LPDA. Right and left heart pressures were completely normal but cardiac output and index were mildly reduced at 3.86 and 2.51 respectively. Anatomy was not felt to be favorable for PCI; therefore, medical therapy recommended. Differential felt to be NSTEMI due to demand ischemia vs small aborted type 1 myocardial infarction vs Takotsubo syndrome. However, she was treated with IV Heparin  for 72 hours and started on DAPT.   She was seen in the ED twice in 10/2022 for elevated BP which was felt to be due to anxiety. She was noted to have very labile BP at initial follow-up visit so was started on PRN Hydralazine  for BP >160/100.   She was last seen by  Barnie Hila, NP, in 11/2022 for follow-up of hypertension at which time home BP log showed BP ranged from 90-120/50-70. Her only complaint at that time was fatigue but she denied any chest pain, shortness of breath, lightheadedness, dizziness, or syncope. Therefore, no medication changes were made.   She was seen in the ED on 01/17/2023 after a mechanical fall where she tripped on a gutter and struck her head. She denied any loss of consciousness at that time. CT showed a large frontal scalp and periorbital hematoma with laceration but no acute intracranial abnormalities, facial bone fractures, or C-spine fractures. She was felt to be stable for discharge from the ED. She called our office on 01/19/2023 and reported bruising and swelling was getting worse so she was advised to hold Plavix  for 5 days.   She was seen in our office in Oct and was recovering OK.   Recently she had an URI with cough. She noted last Monday her legs felt like jelly and BP was markedly elevated to 200. Went to ED. CXR and labs ok. Given hydralazine  and BP came down. Symptoms recurred on Friday and BP again shot up. Instructed to increase losartan  to 50 mg daily and since then BP has been ok. She denies any chest pain. No SOB. Cough has almost completely resolved. No edema.     EKGs/Labs/Other Studies Reviewed:    The following studies were reviewed:  Echocardiogram 10/17/2022: Impressions: 1. There is akinesis of the  distal 2/3 of the left ventricle with apical  ballooning suggestive of possible Tako-Tsubo (stress-induced)  cardiomyopathy. Left ventricular ejection fraction, by estimation, is 30  to 35%. The left ventricle has moderately  decreased function. The left ventricle demonstrates regional wall motion  abnormalities (see scoring diagram/findings for description). Left  ventricular diastolic parameters are consistent with Grade II diastolic  dysfunction (pseudonormalization).   2. Right ventricular  systolic function is normal. The right ventricular  size is normal. There is normal pulmonary artery systolic pressure.   3. Left atrial size was mild to moderately dilated.   4. The mitral valve is normal in structure. Mild mitral valve  regurgitation. No evidence of mitral stenosis.   5. The aortic valve is tricuspid. There is mild calcification of the  aortic valve. Aortic valve regurgitation is trivial. Aortic valve  sclerosis/calcification is present, without any evidence of aortic  stenosis.   6. The inferior vena cava is normal in size with greater than 50%  respiratory variability, suggesting right atrial pressure of 3 mmHg.  _______________   Right/ Left Cardiac Catheterization 10/17/2022:   Prox LAD to Mid LAD lesion is 45% stenosed at major 1st Diag followed by Mid LAD-1 lesion is 70% stenosed- just after 1st Sept (with ~ berry aneurysmal segment.  Not favorable for PCI.   Mid LAD-2 lesion is 70% stenosed.  Mid LAD-3 lesion is 85% stenosed. With difuse moderate disease throughout.   1st Diag lesion is 60% stenosed.   LPDA lesion is 90% stenosed.   LV end diastolic pressure is normal.   There is no aortic valve stenosis.   POST-CATH DIAGNOSES Severe diffuse LAD disease and focal left PDA disease of the left dominant system and separate ostia of LAD and dominant LCx. LAD has multiple sequential lesions ranging from 40% to 85-90% with 1 area involving branch points of major diagonal branch and septal perforator not very favorable for PCI especially given extent of calcification. Focal PDA 90% stenosis just prior to a very tortuous area.  (PCI amenable, but question of usefulness with the extent of disease in LAD). Well compensated CHF with completely normal Right Heart Cath and LV Pressures, but notably reduced Cardiac Output and Index (3.86, 2.51) PAP-mean 21/11-16 million mercury, PCWP 6 mm, LV P-EDP 128/2-11 mmHg with a AO P-MAP 124/60-86 mmHg. Ao sat 94%, PA sat 64%.    RECOMMENDATIONS In the absence of any other complications or medical issues, we expect the patient to be ready for discharge from a cath perspective. Would optimize medical therapy for CAD and cardiomyopathy.  Appears to be well compensated. If she has worsening dyspnea on exertion or anginal equivalent type symptoms, could consider reevaluating PCI options, but for now would plan to treat medically.  Stabilized medically prior to discharge. Reinitiate IV heparin  2 hours after TR band removal and run for total of 48 hours. Based on likely ACS presentation and existing disease, would recommend at least 3 to 6 months of DAPT followed by completion of 1 year Plavix  monotherapy. Will DC lisinopril  with plans to potentially convert to ARB plus or minus Entresto.   Diagnostic Dominance: Right      Echo 03/05/23: IMPRESSIONS     1. Left ventricular ejection fraction, by estimation, is 60 to 65%. The  left ventricle has normal function. The left ventricle has no regional  wall motion abnormalities. Left ventricular diastolic parameters are  consistent with Grade I diastolic  dysfunction (impaired relaxation).   2. Right ventricular systolic function is  normal. The right ventricular  size is normal. Tricuspid regurgitation signal is inadequate for assessing  PA pressure.   3. Left atrial size was mild to moderately dilated.   4. The mitral valve is normal in structure. Mild mitral valve  regurgitation.   5. The aortic valve is tricuspid. There is mild calcification of the  aortic valve. Aortic valve regurgitation is mild.   6. The inferior vena cava is normal in size with greater than 50%  respiratory variability, suggesting right atrial pressure of 3 mmHg.   Conclusion(s)/Recommendation(s): Compared to prior study, EF has improved.  no longer has apical ballooning.   EKG:  EKG not ordered today.   Recent Labs: 10/17/2022: TSH 2.258 10/31/2022: Magnesium 2.2 01/17/2023: ALT 32 03/30/2023:  BUN 10; Creatinine, Ser 0.74; Hemoglobin 13.5; Platelets 283; Potassium 4.1; Sodium 134 03/31/2023: B Natriuretic Peptide 48.0  Recent Lipid Panel    Component Value Date/Time   CHOL 149 12/25/2022 0922   TRIG 65 12/25/2022 0922   HDL 60 12/25/2022 0922   CHOLHDL 2.5 12/25/2022 0922   CHOLHDL 2.6 10/17/2022 1216   VLDL 16 10/17/2022 1216   LDLCALC 76 12/25/2022 0922    Physical Exam:    Vital Signs: BP 118/84 (BP Location: Right Arm, Patient Position: Sitting, Cuff Size: Normal)   Pulse 72   Ht 4' 10.5 (1.486 m)   Wt 117 lb (53.1 kg)   LMP 03/31/1988 (Approximate)   BMI 24.04 kg/m     Wt Readings from Last 3 Encounters:  04/06/23 117 lb (53.1 kg)  02/20/23 121 lb 14.6 oz (55.3 kg)  02/16/23 121 lb (54.9 kg)     General: 82 y.o. Caucasian female in no acute distress. HEENT: Normocephalic and atraumatic. Sclera clear.  Neck: Supple. No carotid bruits. No JVD. Heart: RRR. Distinct S1 and S2. No murmurs, gallops, or rubs.  Lungs: No increased work of breathing. Clear to ausculation bilaterally. No wheezes, rhonchi, or rales.  Abdomen: Soft, non-distended, and non-tender to palpation.  Extremities: No lower extremity edema.   Skin: Warm and dry. Patient has significant ecchymosis on face (left > right) and left arm from recent mechanical fall. Neuro: No focal deficits. Psych: Normal affect. Responds appropriately.   Assessment:    1. Coronary artery disease involving native coronary artery of native heart without angina pectoris   2. Primary hypertension   3. Hyperlipidemia, unspecified hyperlipidemia type   4. Chronic combined systolic (congestive) and diastolic (congestive) heart failure (HCC)      Plan:    CAD  History of NSTEMI in 09/2022. LHC showed severe diffuse LAD disease and focal left PDA disease. Anatomy not favorable for PCI so medical therapy recommended. Differential felt to be NSTEMI due to demand ischemia vs small aborted type 1 myocardial infarction  vs Takotsubo syndrome. Subsequent Echo this past month showed normalization c/w this being more of a stress induced CM. She has no angina.  - asymptomatic.  - Continue DAPT with Aspirin  and Plavix . Plan to continue this for 12 months after MI and then Aspirin  alone.  - Continue beta-blocker and statin.   2. Chronic Combined CHF Echo in July with  LVEF of 30-35% with akinesis of the distal 2/3 of the LV with apical ballooning (suggestive of possible stress-induced cardiomyopathy vs LAD ischemia) as well as grade 2 diastolic dysfunction and mild MR. LHC showed severe diffuse LAD disease as above. Right and left heart pressures were completely normal but cardiac output and index were mildly reduced at 3.86  and 2.51 respectively. GDMT limited by labile BP. - repeat Echo in Dec showed normalization of LV function.  - Continue Losartan  50 mg daily.  - Continue Coreg  to 3.125mg  - Continue Farxiga  10mg  daily.   3. Hypertension She has a history of labile hypertension with anxiety felt to be playing a large role in elevated readings.  - BP well controlled today. Recommend continuing higher losartan  dose 50 mg daily - continue Coreg  to 3.125mg  twice daily  - may take Hydralazine  25mg  PRN for BP >160/100 (can take up to twice daily).    4. Hyperlipidemia Lipid panel in 11/2022: Total Cholesterol 149, Triglycerides 65, HDL 60, LDL 76. LDL goal <55 under new guidelines.  - Continue Lipitor 40mg  daily. - LDL is not quite at goal. Continue dietary modification. Plan repeat lipids on next visit  5. Fatigue  Leg Weakness She did suffer a fall in October. Otherwise did well with cardiac Rehab which she completed around Thanksgiving.  Will monitor  Disposition: follow up in 3-4 months with lipids   Signed, Kuper Rennels, MD  04/06/2023 12:05 PM    [facility]

## 2023-03-31 ENCOUNTER — Encounter (HOSPITAL_COMMUNITY): Payer: Self-pay

## 2023-03-31 DIAGNOSIS — I10 Essential (primary) hypertension: Secondary | ICD-10-CM | POA: Diagnosis not present

## 2023-03-31 LAB — BRAIN NATRIURETIC PEPTIDE: B Natriuretic Peptide: 48 pg/mL (ref 0.0–100.0)

## 2023-03-31 LAB — TROPONIN I (HIGH SENSITIVITY)
Troponin I (High Sensitivity): 23 ng/L — ABNORMAL HIGH (ref <18)
Troponin I (High Sensitivity): 25 ng/L — ABNORMAL HIGH (ref <18)

## 2023-03-31 MED ORDER — CARVEDILOL 3.125 MG PO TABS
3.1250 mg | ORAL_TABLET | Freq: Once | ORAL | Status: AC
  Administered 2023-03-31: 3.125 mg via ORAL
  Filled 2023-03-31: qty 1

## 2023-03-31 MED ORDER — HYDRALAZINE HCL 10 MG PO TABS
10.0000 mg | ORAL_TABLET | Freq: Once | ORAL | Status: AC
  Administered 2023-03-31: 10 mg via ORAL
  Filled 2023-03-31: qty 1

## 2023-03-31 NOTE — ED Provider Notes (Signed)
 [facility]-EMERGENCY DEPT [facility] Emergency Department Provider Note MRN:  [id number]  Arrival date & time: 03/31/23     Chief Complaint   Hypertension and Chest Pain   History of Present Illness   Beth Simon is a 82 y.o. year-old female with a history of CAD presenting to the ED with chief complaint of hypertension.  Felt generally unwell today, malaise, felt like her legs were jelly.  Had an unsettling feeling in her chest but denies pain.  Checked her blood pressure and it was persistently elevated today.  Came here for evaluation.  Did not take her evening medications.  Denies shortness of breath, no new leg pain or swelling, no numbness or weakness to the arms or legs, no headache, no vision change.  Review of Systems  A thorough review of systems was obtained and all systems are negative except as noted in the HPI and PMH.   Patient's Health History    Past Medical History:  Diagnosis Date   Adenomatous polyps 01/2016   Anemia    Breast cancer (HCC) 1990   bilateral mastectomy, adenoca breast-left MRM, reconstruction, chemo   Bronchitis last 2 weeks   saw dr roseann 02-28-2014, he said no antibiotics needed, nonproductive cough   Complication of anesthesia    Cystitis    cytoxen, had once or twice   Family history of breast cancer    History of breast cancer    History of radiation therapy 2/10, 2/12, 2/18, 2/24, 05/29/14   vaginal cuff/ 30 Gy/ 5 fx   Hypertension    Macular degeneration    Neck pain    taking physictal therapy last 2 weeks   Osteoporosis    On Prolia .   PONV (postoperative nausea and vomiting)    Shoulder pain    taking physical therapy for last few weeks   Uterine cancer (HCC) 03/21/2014   MLH1/PMS2 LOH   Uterine fibroid    VAIN I (vaginal intraepithelial neoplasia grade I) 2018   positive HR HPV.   Vasovagal syncopes     Past Surgical History:  Procedure Laterality Date   BREAST IMPLANT REMOVAL  6/94   left breast   BREAST  RECONSTRUCTION  1990   left   CATARACT EXTRACTION Bilateral    EXCISION VAGINAL CYST     MASTECTOMY Right 10/08/06   prophylactic   RADICAL MASTECTOMY LND  1990   left, chemo done   RIGHT/LEFT HEART CATH AND CORONARY ANGIOGRAPHY N/A 10/17/2022   Procedure: RIGHT/LEFT HEART CATH AND CORONARY ANGIOGRAPHY;  Surgeon: Anner Alm ORN, MD;  Location: [facility] INVASIVE CV LAB;  Service: Cardiovascular;  Laterality: N/A;   ROBOTIC ASSISTED TOTAL HYSTERECTOMY WITH BILATERAL SALPINGO OOPHERECTOMY Bilateral 03/21/2014   Procedure: ROBOTIC ASSISTED TOTAL HYSTERECTOMY WITH BILATERAL SALPINGO OOPHORECTOMY ;  Surgeon: Sari Bachelor, MD;  Location: [facility];  Service: Gynecology;  Laterality: Bilateral;    Family History  Problem Relation Age of Onset   Breast cancer Sister 66       recurrence age 78   Breast cancer Paternal Grandmother    Heart disease Mother    Heart disease Father    Hypertension Sister    Dementia Sister    Breast cancer Maternal Aunt 46   Dementia Maternal Aunt        dx in her 85s    Social History   Socioeconomic History   Marital status: Widowed    Spouse name: Not on file   Number of children: 1   Years  of education: Not on file   Highest education level: Not on file  Occupational History   Not on file  Tobacco Use   Smoking status: Never    Passive exposure: Never   Smokeless tobacco: Never  Vaping Use   Vaping status: Never Used  Substance and Sexual Activity   Alcohol use: No   Drug use: No   Sexual activity: Not Currently    Partners: Male    Comment: 1st intercourse 47 yo-2 partners  Other Topics Concern   Not on file  Social History Narrative   Not on file   Social Drivers of Health   Financial Resource Strain: Not on file  Food Insecurity: No Food Insecurity (10/17/2022)   Hunger Vital Sign    Worried About Running Out of Food in the Last Year: Never true    Ran Out of Food in the Last Year: Never true  Transportation Needs: No Transportation Needs  (10/17/2022)   PRAPARE - Administrator, Civil Service (Medical): No    Lack of Transportation (Non-Medical): No  Physical Activity: Not on file  Stress: Not on file  Social Connections: Not on file  Intimate Partner Violence: Not At Risk (10/17/2022)   Humiliation, Afraid, Rape, and Kick questionnaire    Fear of Current or Ex-Partner: No    Emotionally Abused: No    Physically Abused: No    Sexually Abused: No     Physical Exam   Vitals:   03/31/23 0330 03/31/23 0354  BP: (!) 142/72 (!) 142/72  Pulse: 63 62  Resp: 16 13  Temp:  97.7 F (36.5 C)  SpO2: 93% 100%    CONSTITUTIONAL: Well-appearing, NAD NEURO/PSYCH:  Alert and oriented x 3, no focal deficits EYES:  eyes equal and reactive ENT/NECK:  no LAD, no JVD CARDIO: Regular rate, well-perfused, normal S1 and S2 PULM:  CTAB no wheezing or rhonchi GI/GU:  non-distended, non-tender MSK/SPINE:  No gross deformities, no edema SKIN:  no rash, atraumatic   *Additional and/or pertinent findings included in MDM below  Diagnostic and Interventional Summary    EKG Interpretation Date/Time:  Monday March 30 2023 16:16:59 EST Ventricular Rate:  74 PR Interval:  160 QRS Duration:  132 QT Interval:  450 QTC Calculation: 499 R Axis:   14  Text Interpretation: Normal sinus rhythm Right bundle branch block Abnormal ECG When compared with ECG of 17-Jan-2023 18:39, PREVIOUS ECG IS PRESENT Confirmed by Theadore Sharper [id number]) on 03/31/2023 1:54:10 AM       Labs Reviewed  BASIC METABOLIC PANEL - Abnormal; Notable for the following components:      Result Value   Sodium 134 (*)    Glucose, Bld 106 (*)    All other components within normal limits  TROPONIN I (HIGH SENSITIVITY) - Abnormal; Notable for the following components:   Troponin I (High Sensitivity) 25 (*)    All other components within normal limits  TROPONIN I (HIGH SENSITIVITY) - Abnormal; Notable for the following components:   Troponin I (High  Sensitivity) 23 (*)    All other components within normal limits  TROPONIN I (HIGH SENSITIVITY) - Abnormal; Notable for the following components:   Troponin I (High Sensitivity) 25 (*)    All other components within normal limits  RESP PANEL BY RT-PCR (RSV, FLU A&B, COVID)  [id number]  CBC WITH DIFFERENTIAL/PLATELET  LIPASE, BLOOD  BRAIN NATRIURETIC PEPTIDE    DG Chest 2 View  Final Result  Medications  carvedilol  (COREG ) tablet 3.125 mg (3.125 mg Oral Given 03/31/23 0229)  hydrALAZINE  (APRESOLINE ) tablet 10 mg (10 mg Oral Given 03/31/23 0229)     Procedures  /  Critical Care Procedures  ED Course and Medical Decision Making  Initial Impression and Ddx Hypertension with vague symptoms of malaise and weakness.  Funny feeling in the chest.  Differential diagnosis includes atypical presentation of ACS, hypertensive urgency, electrolyte disturbance.  Past medical/surgical history that increases complexity of ED encounter: Hypertension  Interpretation of Diagnostics I personally reviewed the EKG and my interpretation is as follows: Right bundle branch block unchanged from prior  Labs reassuring with no significant blood count or electrolyte disturbance, troponin minimally elevated and flat upon repeat, similar to prior values on record.  Patient Reassessment and Ultimate Disposition/Management     Patient's blood pressure is much improved, she feels better, never had any chest pain and so highly doubt ACS, appropriate for discharge with return precautions.  Patient management required discussion with the following services or consulting groups:  None  Complexity of Problems Addressed Acute illness or injury that poses threat of life of bodily function  Additional Data Reviewed and Analyzed Further history obtained from: Prior labs/imaging results  Additional Factors Impacting ED Encounter Risk Consideration of hospitalization  Ozell HERO. Theadore, MD [facility] Emergency  Medicine [facility] [email or web address]  Final Clinical Impressions(s) / ED Diagnoses     ICD-10-CM   1. Hypertension, unspecified type  I10     2. Malaise  R53.81       ED Discharge Orders     None        Discharge Instructions Discussed with and Provided to Patient:     Discharge Instructions      You were evaluated in the Emergency Department and after careful evaluation, we did not find any emergent condition requiring admission or further testing in the hospital.  Your exam/testing today is overall reassuring.  Recommend close follow-up with your primary care doctor to discuss your blood pressure management.  Please return to the Emergency Department if you experience any worsening of your condition.   Thank you for allowing us  to be a part of your care.       Theadore Ozell HERO, MD 03/31/23 [phone]

## 2023-03-31 NOTE — Discharge Instructions (Addendum)
 You were evaluated in the Emergency Department and after careful evaluation, we did not find any emergent condition requiring admission or further testing in the hospital.  Your exam/testing today is overall reassuring.  Recommend close follow-up with your primary care doctor to discuss your blood pressure management.  Please return to the Emergency Department if you experience any worsening of your condition.   Thank you for allowing us  to be a part of your care.

## 2023-04-03 ENCOUNTER — Telehealth: Payer: Self-pay | Admitting: Cardiology

## 2023-04-03 NOTE — Telephone Encounter (Signed)
 Pt c/o BP issue: STAT if pt c/o blurred vision, one-sided weakness or slurred speech  1. What are your last 5 BP readings?   171/85 (today - 1/3) 167/85 (30 minutes prior) 120/65 (yesterday - 1/2) 160/81 (Wed, 1/1)  2. Are you having any other symptoms (ex. Dizziness, headache, blurred vision, passed out)?    Legs feel funny, like jelly  3. What is your BP issue?    Patient stated this past week she has been having high BP readings.  Patient noted on Monday when she had high BP readings she went to ED.  Patient stated on Wednesday she had high BP readings again and took a hydrALAZINE  (APRESOLINE ) 25 MG tablet (Expired) and her BP went down.  Patient stated today her BP was high again and she took another hydrALAZINE  (APRESOLINE ) 25 MG tablet (Expired) at around 3:30 pm today.  Patient wants advice on next steps.  Patient has appointment at 1/6.

## 2023-04-03 NOTE — Telephone Encounter (Signed)
 Patient identification verified by 2 forms. Bertina Cooks, RN    Called and spoke to patient  Patient states:   -Is having some high BP issues   -BP concern on going this week   -On Monday SBP increased to 200, presented to ED   -knew BP was high due to jelly feeling in legs   -ED unsure why BP increased, was discharged with hydralazine  Rx  -on Wednesday has jelly feeling in leg again and BP was high at 160/81  -Wednesday when BP high she took dose of Hydralazine    -Yesterday had no BP issues   -this morning she felt fine, developed funny feeling in legs and BP was 171/85  -took dose of hydralazine  at 3:30pm today   -lisinopril  Rx was discontinued in July   -takes Carvedilol  and losartan  for BP daily   -BP at time of call: 187/94  -feels like heart is pounding and a bit anxious Patient denies:   -headaches   -chest pressure/chest pain   -SOB Informed patient:  -RN will speak to DOD and call with recommendations

## 2023-04-03 NOTE — Telephone Encounter (Signed)
 Spoke to DOD Dr. Barbaraann  Dr. Barbaraann recommends:   -increase losartan  to 50mg  daily   __________________ Patient identification verified by 2 forms. Beth Cooks, RN    Called and spoke to patient  Advised patient:   -Can take extra dose of losartan  tonight   -tomorrow begin taking 50mg  losartan  in the morning   -can take PRN Hydralazine  25mg  for BP > 160/100   -check BP 1 hour after taking medication, keep log   -keep 04/06/23 OV with Dr. Jordan for follow up  Reviewed ED warning signs/precautions  Patient verbalized understanding, no questions at this time

## 2023-04-06 ENCOUNTER — Ambulatory Visit: Payer: HMO | Attending: Cardiology | Admitting: Cardiology

## 2023-04-06 ENCOUNTER — Encounter: Payer: Self-pay | Admitting: Cardiology

## 2023-04-06 VITALS — BP 118/84 | HR 72 | Ht 58.5 in | Wt 117.0 lb

## 2023-04-06 DIAGNOSIS — I2510 Atherosclerotic heart disease of native coronary artery without angina pectoris: Secondary | ICD-10-CM

## 2023-04-06 DIAGNOSIS — I10 Essential (primary) hypertension: Secondary | ICD-10-CM | POA: Diagnosis not present

## 2023-04-06 DIAGNOSIS — E785 Hyperlipidemia, unspecified: Secondary | ICD-10-CM

## 2023-04-06 DIAGNOSIS — I5042 Chronic combined systolic (congestive) and diastolic (congestive) heart failure: Secondary | ICD-10-CM | POA: Diagnosis not present

## 2023-04-06 MED ORDER — LOSARTAN POTASSIUM 50 MG PO TABS: 50.0000 mg | ORAL_TABLET | Freq: Every day | ORAL | 3 refills | Status: DC

## 2023-04-06 NOTE — Addendum Note (Signed)
 Addended by: Neoma Laming on: 04/06/2023 01:55 PM   Modules accepted: Orders

## 2023-04-06 NOTE — Patient Instructions (Addendum)
 Medication Instructions:  Continue all medications *If you need a refill on your cardiac medications before your next appointment, please call your pharmacy*   Lab Work: Cmet and lipid panel to be done before next appointment    Testing/Procedures: None ordered   Follow-Up: At [facility], you and your health needs are our priority.  As part of our continuing mission to provide you with exceptional heart care, we have created designated Provider Care Teams.  These Care Teams include your primary Cardiologist (physician) and Advanced Practice Providers (APPs -  Physician Assistants and Nurse Practitioners) who all work together to provide you with the care you need, when you need it.  We recommend signing up for the patient portal called MyChart.  Sign up information is provided on this After Visit Summary.  MyChart is used to connect with patients for Virtual Visits (Telemedicine).  Patients are able to view lab/test results, encounter notes, upcoming appointments, etc.  Non-urgent messages can be sent to your provider as well.   To learn more about what you can do with MyChart, go to [email or web address].    Your next appointment:  3 to 4 months    Provider:  Dr.Jordan

## 2023-04-14 ENCOUNTER — Encounter: Payer: Self-pay | Admitting: Podiatry

## 2023-04-14 ENCOUNTER — Ambulatory Visit (INDEPENDENT_AMBULATORY_CARE_PROVIDER_SITE_OTHER): Payer: HMO | Admitting: Podiatry

## 2023-04-14 DIAGNOSIS — M79676 Pain in unspecified toe(s): Secondary | ICD-10-CM | POA: Diagnosis not present

## 2023-04-14 DIAGNOSIS — Z7901 Long term (current) use of anticoagulants: Secondary | ICD-10-CM

## 2023-04-14 DIAGNOSIS — M2042 Other hammer toe(s) (acquired), left foot: Secondary | ICD-10-CM

## 2023-04-14 DIAGNOSIS — L84 Corns and callosities: Secondary | ICD-10-CM | POA: Diagnosis not present

## 2023-04-14 DIAGNOSIS — B351 Tinea unguium: Secondary | ICD-10-CM | POA: Diagnosis not present

## 2023-04-14 DIAGNOSIS — M778 Other enthesopathies, not elsewhere classified: Secondary | ICD-10-CM

## 2023-04-14 DIAGNOSIS — M2041 Other hammer toe(s) (acquired), right foot: Secondary | ICD-10-CM

## 2023-04-14 NOTE — Progress Notes (Addendum)
 Subjective: Chief Complaint  Patient presents with   RFC    Rm#12 RFC      83 y.o. returns the office today for painful, elongated, thickened toenails which she is unable to trim herself.  States the nails get painful to get elongated, but they have not grown as much.  The big toes are not causing discomfort she only does feel her nails trimmed today.  She does have hammertoes which resulted in a painful callus that will hurt at times.  She is also to proceed with orthotics.  She states that her foot rolls continues is causing the pressure on her toes.  She is on Plavix .  Objective: AAO 3, NAD DP/PT pulses palpable, CRT less than 3 seconds Nails hypertrophic, dystrophic, elongated, brittle, discolored 8.  Incurvation of the nails mostly on the right without signs of infection today.  There is no drainage or pus today.  There is tenderness overlying the nails 1-5 bilaterally.  No signs of infection.  Ingrown toenails present to multiple toenails. There is hyperkeratotic lesions of the distal aspect left second digit and there is new callus to the tip of the left fourth toe as well. No underlying ulceration, drainage or other signs of infection.  Hammertoes present No pain with calf compression, warmth, erythema.  Assessment: Symptomatic onychomycosis, ingrown toenails; hyperkeratotic lesion left foot due to hammertoe deformity  Plan: -Treatment options including alternatives, risks, complications were discussed -Nails sharply debrided 8 without any complications or bleeding.  Monitor ingrown toenails. -Hyperkeratotic lesion sharply debrided 2 without complication/bleeding -I do think she will benefit from inserts to help decrease pressure on the toes which are causing discomfort.  Will submit authorization to [employer] (faxed)  [name] DPM

## 2023-04-20 ENCOUNTER — Telehealth: Payer: Self-pay | Admitting: Internal Medicine

## 2023-04-20 NOTE — Telephone Encounter (Signed)
Patient called the after hours cardiology line to report that her blood pressure at home was 185/92 mmHg.  She took her hydralazine without much improvement in BP after one hour.  Informed patient to take an extra dose of losartan 50mg  once.  She should recheck her blood pressure in the morning after waking up.

## 2023-05-11 ENCOUNTER — Telehealth: Payer: Self-pay

## 2023-05-11 NOTE — Telephone Encounter (Signed)
 Beth Simon APPROVAL # [id number] VALID THRU 04/14/23-07/13/23

## 2023-05-22 ENCOUNTER — Telehealth: Payer: Self-pay | Admitting: Cardiology

## 2023-05-22 MED ORDER — CARVEDILOL 6.25 MG PO TABS
6.2500 mg | ORAL_TABLET | Freq: Two times a day (BID) | ORAL | 0 refills | Status: DC
Start: 2023-05-22 — End: 2023-06-05

## 2023-05-22 NOTE — Telephone Encounter (Signed)
Spoke to pt about her elevated BP.  She states she had an OV with Dr. Swaziland early Jan. 2025, then on 04/19/23 had one episode of HTN, then again on 05/21/2023.  She then had to call in to after hours on call staff. She denies other symptoms, does c/o face feeling flushed.    Per Dr. [place]and, advised pt to begin taking 6.25 mg Carvedilol BID.  Will update in her current med list to reflect change.  Pt verbalized understanding. She will call back if needed.  Has OV w/Dr. Swaziland on 07/13/23, with labs week prior.

## 2023-05-22 NOTE — Telephone Encounter (Signed)
Received a page around 1:50 AM on 2/21 to call Beth Simon.  Was able to reach her and verified name and date of birth.  States that she was having normal blood pressures in previous days.  She took her Coreg this evening and 1.5 hours after taking her Coreg, she checked her blood pressure and noted that her blood pressure was in the 150s/80s.  This prompted her to serially check her blood pressure throughout the night noting that her blood pressure peaked in the 180s/90s.  She has taken hydralazine 25 mg twice, about an hour apart from each other with minimal blood pressure improvement.  She denies any current chest discomfort, shortness of breath, dizziness/lightheadedness, headache, blurry vision.  She said that something similar to this happened about 2-3 weeks ago and she was instructed to take an additional dose of her losartan 50 mg which she states helped bring down her blood pressure over the next 24 hours.  She feels that she is very anxious, mainly related to the elevated blood pressure, and this has happened on multiple different occasions in the past.  I informed her to take an additional dose of her losartan and an additional dose of Coreg 3.125 mg the night and then resume her normal regimen in the morning.  She can retake her blood pressure in the morning.

## 2023-05-22 NOTE — Telephone Encounter (Signed)
Spoke to patient Dr.Jordan advised to increase Coreg to 6.25 mg twice a day.Advised to monitor B/P daily and call back if continues to elevated.Advised to keep appointment already scheduled with Dr.Jordan 4/14 at 8:20 am.

## 2023-05-22 NOTE — Telephone Encounter (Signed)
Pt c/o BP issue: STAT if pt c/o blurred vision, one-sided weakness or slurred speech  1. What are your last 5 BP readings? 195/103 and 183/92 after taking 2 hydralazine last night. 154/86 HR 83 this morning after taking all of her morning medications  2. Are you having any other symptoms (ex. Dizziness, headache, blurred vision, passed out)? Face is flushed  3. What is your BP issue? Patient called into the office this morning at 1:48am to speak with the on call provider. I was going through the paperwork from after hours answering sevice to make sure everyone received a call back from the on call provider. I called the patient just to check up on how her BP was, she states her BP this morning after taking all of her medications after breakfast was still 154/86 HR 83 and her face is feeling flushed.

## 2023-06-05 ENCOUNTER — Telehealth: Payer: Self-pay | Admitting: Cardiology

## 2023-06-05 MED ORDER — CARVEDILOL 12.5 MG PO TABS: 12.5000 mg | ORAL_TABLET | Freq: Two times a day (BID) | ORAL | 0 refills | Status: DC

## 2023-06-05 NOTE — Telephone Encounter (Signed)
 [place], Peter M, MD  You; Charna Elizabeth, LPN3 minutes ago (4:57 PM)    I would go ahead and increase Coreg to 12.5 mg bid  Peter [place] MD, [facility]  Patient identification verified by 2 forms. Marilynn Rail, RN    Called and spoke to patient  Relayed provider message  Reviewed Rx instruction/education  Patient aware Rx sent to preferred pharmacy  Advised patient to keep BP log and follow up at 4/14 OV  Patient verbalized understanding, no questions at this time

## 2023-06-05 NOTE — Telephone Encounter (Signed)
 Pt c/o BP issue: STAT if pt c/o blurred vision, one-sided weakness or slurred speech  1. What are your last 5 BP readings?  3/06 PM: 170/91, 122/66  2. Are you having any other symptoms (ex. Dizziness, headache, blurred vision, passed out)?  No  3. What is your BP issue?   Patient states last night BP got up to 170/91. She took a hydralazine and it went back down to 122/66 within 2 hours. Patient states she was asymptomatic.

## 2023-06-05 NOTE — Telephone Encounter (Signed)
 Patient identification verified by 2 forms. Marilynn Rail, RN    Called and spoke to patient  Patient states:   -has occasional episodes when her BP spikes   -spikes in BP have been occurring more frequently   -Takes carvedilol 6.25mg  BID, Losartan 50mg  in the morning   -wanted Dr. Swaziland to be aware of increase of BP Spikes   -Spikes occurred on: 1/19, 2/20, 2/28  -During spikes BP can increase to 200s/100s  -3/7: BP was 170/91 took 2 Hydralazine BP decreased to 122/66 two hours later  -when BP spikes she develops a headache the following day   -notice BP spikes occur in the evening   -at this time she feels okay, without symptoms   -Has OV with Dr. Swaziland 4/14 unsure if she needs sooner follow up  Patient denies:   -Chest pain   -headaches  -SOB/difficulty breathing  Advised patient to monitor BP and symptoms  Reviewed ED warning signs/precautions  Patient verbalized understanding, no questions at this time

## 2023-06-08 ENCOUNTER — Telehealth: Payer: Self-pay | Admitting: Cardiology

## 2023-06-08 NOTE — Telephone Encounter (Signed)
 [place], Peter M, MD  Alden Server minutes ago (2:19 PM)    We haven't given it long enough on new dose. Lets give it 3 weeks and reassess  Peter [place] MD, [facility]    Patient identification verified by 2 forms. Marilynn Rail, RN    Called and spoke to patient  Relayed Dr. [place]and message  Patient states:   -rechecked BP after ending call with RN   -at 2:30pm her BP decreased to 136/68 Advised patient:   -continue new updated medication   -keep BP log and review at 4/14 OV  Reviewed ED warning signs/precautions  Patient verbalized understanding, no questions at this time

## 2023-06-08 NOTE — Telephone Encounter (Signed)
 Patient identification verified by 2 forms. Marilynn Rail, RN    Called and spoke to patient  Patient states:   -picked up carvedilol Saturday Morning   -Sunday night she developed a headache and pulsing sensation in head/neck   -at 5am BP was 183/93  -took two doses of hydralazine 25mg  over the course of two hour   -8:30am BP was 110/26  -This morning took regular Rx of: Carvedilol 12.5mg  and losartan 50mg    -At time of call 171/81, thinks it is elevated due recent activity  Patient denies:   -headache   -chest pressure/pain Informed patient message sent to Dr. Swaziland  Patient agrees with plan, no questions at this time

## 2023-06-08 NOTE — Telephone Encounter (Signed)
 Pt c/o medication issue:  1. Name of Medication: Carvedilol was increased on 06-06-23  2. How are you currently taking this medication (dosage and times per day)?   3. Are you having a reaction (difficulty breathing--STAT)?   4. What is your medication issue? Last night she had episode with a strong pulse, she could feel it every where- her blood pressure was  183/93- she said Dr Swaziland told her when this happen to take Hydralazine. It took 3 1/2 hours before it went back

## 2023-06-12 ENCOUNTER — Ambulatory Visit: Payer: HMO | Admitting: Podiatry

## 2023-06-12 ENCOUNTER — Encounter: Payer: Self-pay | Admitting: Podiatry

## 2023-06-12 DIAGNOSIS — M79676 Pain in unspecified toe(s): Secondary | ICD-10-CM | POA: Diagnosis not present

## 2023-06-12 DIAGNOSIS — B351 Tinea unguium: Secondary | ICD-10-CM

## 2023-06-12 DIAGNOSIS — L84 Corns and callosities: Secondary | ICD-10-CM

## 2023-06-12 DIAGNOSIS — Z7901 Long term (current) use of anticoagulants: Secondary | ICD-10-CM | POA: Diagnosis not present

## 2023-06-15 NOTE — Progress Notes (Signed)
 Subjective: Chief Complaint  Patient presents with   Nail Problem    RM#13 Follow up for painful, elongated, thickened toenails/nail trim     83 y.o. returns the office today for painful, elongated, thickened toenails which she is unable to trim herself.  States the nails get painful to get elongated, but they have not grown as much.  She is on Plavix.  Objective: AAO 3, NAD DP/PT pulses palpable, CRT less than 3 seconds Nails hypertrophic, dystrophic, elongated, brittle, discolored 8.  Incurvation of the nails mostly on the right without signs of infection today.  There is no drainage or pus today.  There is tenderness overlying the nails 1-5 bilaterally.  No signs of infection.  Ingrown toenails present to multiple toenails. There is hyperkeratotic lesions of the distal aspect left second digit and there is new callus to the tip of the left fourth toe as well. No underlying ulceration, drainage or other signs of infection.  Hammertoes present No pain with calf compression, warmth, erythema.  Assessment: Symptomatic onychomycosis, ingrown toenails; hyperkeratotic lesion left foot due to hammertoe deformity  Plan: -Treatment options including alternatives, risks, complications were discussed -Nails sharply debrided 8 without any complications or bleeding.  Monitor ingrown toenails. -Hyperkeratotic lesion sharply debrided 2 without complication/bleeding -Received authorization for orthotics.  Will schedule for measurement of inserts.  Vivi Barrack DPM

## 2023-07-01 DIAGNOSIS — M5135 Other intervertebral disc degeneration, thoracolumbar region: Secondary | ICD-10-CM | POA: Diagnosis not present

## 2023-07-08 DIAGNOSIS — I2510 Atherosclerotic heart disease of native coronary artery without angina pectoris: Secondary | ICD-10-CM | POA: Diagnosis not present

## 2023-07-08 DIAGNOSIS — I5042 Chronic combined systolic (congestive) and diastolic (congestive) heart failure: Secondary | ICD-10-CM | POA: Diagnosis not present

## 2023-07-08 DIAGNOSIS — M5135 Other intervertebral disc degeneration, thoracolumbar region: Secondary | ICD-10-CM | POA: Diagnosis not present

## 2023-07-08 DIAGNOSIS — E785 Hyperlipidemia, unspecified: Secondary | ICD-10-CM | POA: Diagnosis not present

## 2023-07-08 DIAGNOSIS — I10 Essential (primary) hypertension: Secondary | ICD-10-CM | POA: Diagnosis not present

## 2023-07-08 LAB — LIPID PANEL
Chol/HDL Ratio: 2.3 ratio (ref 0.0–4.4)
Cholesterol, Total: 152 mg/dL (ref 100–199)
HDL: 65 mg/dL (ref >39)
LDL Chol Calc (NIH): 75 mg/dL (ref 0–99)
Triglycerides: 61 mg/dL (ref 0–149)
VLDL Cholesterol Cal: 12 mg/dL (ref 5–40)

## 2023-07-08 LAB — COMPREHENSIVE METABOLIC PANEL WITH GFR
ALT: 12 IU/L (ref 0–32)
AST: 17 IU/L (ref 0–40)
Albumin: 4.2 g/dL (ref 3.7–4.7)
Alkaline Phosphatase: 47 IU/L (ref 44–121)
BUN/Creatinine Ratio: 19 (ref 12–28)
BUN: 15 mg/dL (ref 8–27)
Bilirubin Total: 0.3 mg/dL (ref 0.0–1.2)
CO2: 22 mmol/L (ref 20–29)
Calcium: 9.1 mg/dL (ref 8.7–10.3)
Chloride: 102 mmol/L (ref 96–106)
Creatinine, Ser: 0.78 mg/dL (ref 0.57–1.00)
Globulin, Total: 2.3 g/dL (ref 1.5–4.5)
Glucose: 83 mg/dL (ref 70–99)
Potassium: 4.8 mmol/L (ref 3.5–5.2)
Sodium: 136 mmol/L (ref 134–144)
Total Protein: 6.5 g/dL (ref 6.0–8.5)
eGFR: 75 mL/min/{1.73_m2} (ref >59)

## 2023-07-10 NOTE — Progress Notes (Signed)
 Cardiology Office Note:    Date:  07/13/2023   ID:  Beth, Simon Aug 29, 1940, MRN [id number]  PCP:  Tena Feeling, MD  Cardiologist:  Medard Decuir [place], MD     Referring MD: Tena Feeling, MD   Chief Complaint: seen for follow up CAD.   History of Present Illness:    Beth Simon is a 83 y.o. female with a history of CAD with NSTEMI on 09/2022 (cath showed diffuse disease but anatomy not favorable for PCI so treated medically), chronic combined CHF with EF of 30-35% on Echo in 09/2022, hypertension, hyperlipidemia, anemia, breast cancer s/p chemo and mastectomy, and uterine cancer s/p radiation and hysterectomy.   Patient was first seen by Cardiology during an admission in 09/2022 for NSTEMI after presenting with palpitations, uncontrolled BP, and mild chest discomfort. High-sensitivity troponin  53 >> 2,054. Echo showed LVEF of 30-35% with akinesis of the distal 2/3 of the LV with apical ballooning (suggestive of possible stress-induced cardiomyopathy vs LAD ischemia) as well as grade 2 diastolic dysfunction and mild MR. R/LHC showed showed 45% stenosis of proximal to mid LAD at major 1st Diag followed by 3 tandem 70%, 70%, and 85% stenoses of mid LAD with diffuse moderate diease throughout as well as 60% stenosis of the 1st Diag and 90% stenosis of LPDA. Right and left heart pressures were completely normal but cardiac output and index were mildly reduced at 3.86 and 2.51 respectively. Anatomy was not felt to be favorable for PCI; therefore, medical therapy recommended. Differential felt to be NSTEMI due to demand ischemia vs small aborted type 1 myocardial infarction vs Takotsubo syndrome. However, she was treated with IV Heparin for 72 hours and started on DAPT.   She was seen in the ED twice in 10/2022 for elevated BP which was felt to be due to anxiety. She was noted to have very labile BP at initial follow-up visit so was started on PRN Hydralazine for BP >160/100.   She was last seen  by Morey Ar, NP, in 11/2022 for follow-up of hypertension at which time home BP log showed BP ranged from 90-120/50-70. Her only complaint at that time was fatigue but she denied any chest pain, shortness of breath, lightheadedness, dizziness, or syncope. Therefore, no medication changes were made.   She was seen in the ED on 01/17/2023 after a mechanical fall where she tripped on a gutter and struck her head. She denied any loss of consciousness at that time. CT showed a large frontal scalp and periorbital hematoma with laceration but no acute intracranial abnormalities, facial bone fractures, or C-spine fractures. She was felt to be stable for discharge from the ED. She called our office on 01/19/2023 and reported bruising and swelling was getting worse so she was advised to hold Plavix for 5 days.   She was seen in our office in Oct and was recovering OK.   Back in March she called with elevated BP readings. We increased Coreg and she notes BP very good after that until Saturday BP went up to 160. BP readings typically excellent control with range 92-130 systolic. She actually feels much better when BP is low. No chest pain or dyspnea. No swelling.     EKGs/Labs/Other Studies Reviewed:    The following studies were reviewed:  Echocardiogram 10/17/2022: Impressions: 1. There is akinesis of the distal 2/3 of the left ventricle with apical  ballooning suggestive of possible Tako-Tsubo (stress-induced)  cardiomyopathy. Left ventricular ejection fraction, by  estimation, is 30  to 35%. The left ventricle has moderately  decreased function. The left ventricle demonstrates regional wall motion  abnormalities (see scoring diagram/findings for description). Left  ventricular diastolic parameters are consistent with Grade II diastolic  dysfunction (pseudonormalization).   2. Right ventricular systolic function is normal. The right ventricular  size is normal. There is normal pulmonary artery  systolic pressure.   3. Left atrial size was mild to moderately dilated.   4. The mitral valve is normal in structure. Mild mitral valve  regurgitation. No evidence of mitral stenosis.   5. The aortic valve is tricuspid. There is mild calcification of the  aortic valve. Aortic valve regurgitation is trivial. Aortic valve  sclerosis/calcification is present, without any evidence of aortic  stenosis.   6. The inferior vena cava is normal in size with greater than 50%  respiratory variability, suggesting right atrial pressure of 3 mmHg.  _______________   Right/ Left Cardiac Catheterization 10/17/2022:   Prox LAD to Mid LAD lesion is 45% stenosed at major 1st Diag followed by Mid LAD-1 lesion is 70% stenosed- just after 1st Sept (with ~ berry aneurysmal segment.  Not favorable for PCI.   Mid LAD-2 lesion is 70% stenosed.  Mid LAD-3 lesion is 85% stenosed. With difuse moderate disease throughout.   1st Diag lesion is 60% stenosed.   LPDA lesion is 90% stenosed.   LV end diastolic pressure is normal.   There is no aortic valve stenosis.   POST-CATH DIAGNOSES Severe diffuse LAD disease and focal left PDA disease of the left dominant system and separate ostia of LAD and dominant LCx. LAD has multiple sequential lesions ranging from 40% to 85-90% with 1 area involving branch points of major diagonal branch and septal perforator not very favorable for PCI especially given extent of calcification. Focal PDA 90% stenosis just prior to a very tortuous area.  (PCI amenable, but question of usefulness with the extent of disease in LAD). Well compensated CHF with completely normal Right Heart Cath and LV Pressures, but notably reduced Cardiac Output and Index (3.86, 2.51) PAP-mean 21/11-16 million mercury, PCWP 6 mm, LV P-EDP 128/2-11 mmHg with a AO P-MAP 124/60-86 mmHg. Ao sat 94%, PA sat 64%.   RECOMMENDATIONS In the absence of any other complications or medical issues, we expect the patient to be  ready for discharge from a cath perspective. Would optimize medical therapy for CAD and cardiomyopathy.  Appears to be well compensated. If she has worsening dyspnea on exertion or anginal equivalent type symptoms, could consider reevaluating PCI options, but for now would plan to treat medically.  Stabilized medically prior to discharge. Reinitiate IV heparin 2 hours after TR band removal and run for total of 48 hours. Based on likely ACS presentation and existing disease, would recommend at least 3 to 6 months of DAPT followed by completion of 1 year Plavix monotherapy. Will DC lisinopril with plans to potentially convert to ARB plus or minus Entresto.   Diagnostic Dominance: Right      Echo 03/05/23: IMPRESSIONS     1. Left ventricular ejection fraction, by estimation, is 60 to 65%. The  left ventricle has normal function. The left ventricle has no regional  wall motion abnormalities. Left ventricular diastolic parameters are  consistent with Grade I diastolic  dysfunction (impaired relaxation).   2. Right ventricular systolic function is normal. The right ventricular  size is normal. Tricuspid regurgitation signal is inadequate for assessing  PA pressure.   3. Left  atrial size was mild to moderately dilated.   4. The mitral valve is normal in structure. Mild mitral valve  regurgitation.   5. The aortic valve is tricuspid. There is mild calcification of the  aortic valve. Aortic valve regurgitation is mild.   6. The inferior vena cava is normal in size with greater than 50%  respiratory variability, suggesting right atrial pressure of 3 mmHg.   Conclusion(s)/Recommendation(s): Compared to prior study, EF has improved.  no longer has apical ballooning.   EKG:  EKG not ordered today.   Recent Labs: 10/17/2022: TSH 2.258 10/31/2022: Magnesium 2.2 03/30/2023: Hemoglobin 13.5; Platelets 283 03/31/2023: B Natriuretic Peptide 48.0 07/08/2023: ALT 12; BUN 15; Creatinine, Ser 0.78;  Potassium 4.8; Sodium 136  Recent Lipid Panel    Component Value Date/Time   CHOL 152 07/08/2023 0816   TRIG 61 07/08/2023 0816   HDL 65 07/08/2023 0816   CHOLHDL 2.3 07/08/2023 0816   CHOLHDL 2.6 10/17/2022 1216   VLDL 16 10/17/2022 1216   LDLCALC 75 07/08/2023 0816    Physical Exam:    Vital Signs: BP (!) 160/70 (BP Location: Right Arm, Cuff Size: Normal)   Pulse 68   Ht 4\' 10"  (1.473 m)   Wt 119 lb 12.8 oz (54.3 kg)   LMP 03/31/1988 (Approximate)   SpO2 95%   BMI 25.04 kg/m     Wt Readings from Last 3 Encounters:  07/13/23 119 lb 12.8 oz (54.3 kg)  04/06/23 117 lb (53.1 kg)  02/20/23 121 lb 14.6 oz (55.3 kg)     General: 83 y.o. Caucasian female in no acute distress. HEENT: Normocephalic and atraumatic. Sclera clear.  Neck: Supple. No carotid bruits. No JVD. Heart: RRR. Distinct S1 and S2. No murmurs, gallops, or rubs.  Lungs: No increased work of breathing. Clear to ausculation bilaterally. No wheezes, rhonchi, or rales.  Abdomen: Soft, non-distended, and non-tender to palpation.  Extremities: No lower extremity edema.   Skin: Warm and dry. Patient has significant ecchymosis on face (left > right) and left arm from recent mechanical fall. Neuro: No focal deficits. Psych: Normal affect. Responds appropriately.   Assessment:    1. Coronary artery disease involving native coronary artery of native heart without angina pectoris   2. Hyperlipidemia, unspecified hyperlipidemia type   3. Primary hypertension   4. Chronic combined systolic and diastolic CHF (congestive heart failure) (HCC)       Plan:    CAD  History of NSTEMI in 09/2022. LHC showed severe diffuse LAD disease and focal left PDA disease. Anatomy not favorable for PCI so medical therapy recommended. Differential felt to be NSTEMI due to demand ischemia vs small aborted type 1 myocardial infarction vs Takotsubo syndrome. Subsequent Echo showed normalization c/w this being more of a stress induced CM.  She has no angina.  - she has no angina.  - plan to discontinue Plavix in July. Continue ASA indefinitely.  - Continue beta-blocker and statin.   2. Chronic Combined CHF Echo in July with  LVEF of 30-35% with akinesis of the distal 2/3 of the LV with apical ballooning (suggestive of possible stress-induced cardiomyopathy vs LAD ischemia) as well as grade 2 diastolic dysfunction and mild MR. LHC showed severe diffuse LAD disease as above. Right and left heart pressures were completely normal but cardiac output and index were mildly reduced at 3.86 and 2.51 respectively. GDMT limited by labile BP. - repeat Echo in Dec showed normalization of LV function.  - Continue Losartan 50 mg daily.  -  Continue Coreg 12.5 mg bid - Continue Farxiga 10mg  daily.   3. Hypertension She has a history of labile hypertension with anxiety felt to be playing a large role in elevated readings.  - BP is elevated today but readings from home have been excellent. Sometimes readings are low but she actually feels better with this.  - continue current regimen - may take Hydralazine 25mg  PRN for BP >160/100 if sustained.    4. Hyperlipidemia -LDL 75. Would like to see lower - Continue Lipitor 40mg  daily. - we discussed adding Zetia but she would like to do better with her diet and try and walk more.    Disposition: follow up in 6 months    Signed, Yana Schorr [place], MD  07/13/2023 8:48 AM    [facility]

## 2023-07-13 ENCOUNTER — Encounter: Payer: Self-pay | Admitting: Cardiology

## 2023-07-13 ENCOUNTER — Ambulatory Visit: Payer: HMO | Attending: Cardiology | Admitting: Cardiology

## 2023-07-13 VITALS — BP 160/70 | HR 68 | Ht <= 58 in | Wt 119.8 lb

## 2023-07-13 DIAGNOSIS — E785 Hyperlipidemia, unspecified: Secondary | ICD-10-CM

## 2023-07-13 DIAGNOSIS — I5042 Chronic combined systolic (congestive) and diastolic (congestive) heart failure: Secondary | ICD-10-CM | POA: Diagnosis not present

## 2023-07-13 DIAGNOSIS — I10 Essential (primary) hypertension: Secondary | ICD-10-CM | POA: Diagnosis not present

## 2023-07-13 DIAGNOSIS — I2510 Atherosclerotic heart disease of native coronary artery without angina pectoris: Secondary | ICD-10-CM

## 2023-07-13 NOTE — Patient Instructions (Signed)
 Medication Instructions:  Stop Plavix 09/29/23 Continue all other medications *If you need a refill on your cardiac medications before your next appointment, please call your pharmacy*  Lab Work: None ordered  Testing/Procedures: None ordered  Follow-Up: At [facility], you and your health needs are our priority.  As part of our continuing mission to provide you with exceptional heart care, our providers are all part of one team.  This team includes your primary Cardiologist (physician) and Advanced Practice Providers or APPs (Physician Assistants and Nurse Practitioners) who all work together to provide you with the care you need, when you need it.  Your next appointment:  6 months   Call in July to schedule Oct appointment     Provider:  Dr.Jordan   We recommend signing up for the patient portal called "MyChart".  Sign up information is provided on this After Visit Summary.  MyChart is used to connect with patients for Virtual Visits (Telemedicine).  Patients are able to view lab/test results, encounter notes, upcoming appointments, etc.  Non-urgent messages can be sent to your provider as well.   To learn more about what you can do with MyChart, go to [email or web address].         1st Floor: - Lobby - Registration  - Pharmacy  - Lab - Cafe  2nd Floor: - PV Lab - Diagnostic Testing (echo, CT, nuclear med)  3rd Floor: - Vacant  4th Floor: - TCTS (cardiothoracic surgery) - AFib Clinic - Structural Heart Clinic - Vascular Surgery  - Vascular Ultrasound  5th Floor: - [facility] (general and EP) - Clinical Pharmacy for coumadin, hypertension, lipid, weight-loss medications, and med management appointments    Valet parking services will be available as well.

## 2023-07-15 DIAGNOSIS — M5135 Other intervertebral disc degeneration, thoracolumbar region: Secondary | ICD-10-CM | POA: Diagnosis not present

## 2023-07-16 ENCOUNTER — Telehealth: Payer: Self-pay

## 2023-07-16 NOTE — Telephone Encounter (Signed)
 Spoke to patient Dr.Jordan received card you mailed.He advised ok to stop taking Plavix in July.She was concerned about stopping.Patient reassured.

## 2023-07-22 DIAGNOSIS — M5135 Other intervertebral disc degeneration, thoracolumbar region: Secondary | ICD-10-CM | POA: Diagnosis not present

## 2023-07-23 ENCOUNTER — Ambulatory Visit

## 2023-07-23 NOTE — Progress Notes (Signed)
 Patient wanted to try ortho foot inserts first before custom  Gave 2pr today she will try and let Dr. Dyana Glade know at next appt.  Britton Cane CPed, CFm CFo

## 2023-07-29 DIAGNOSIS — M5135 Other intervertebral disc degeneration, thoracolumbar region: Secondary | ICD-10-CM | POA: Diagnosis not present

## 2023-08-04 ENCOUNTER — Telehealth: Payer: Self-pay | Admitting: Cardiology

## 2023-08-04 NOTE — Telephone Encounter (Signed)
 Pt c/o BP issue: STAT if pt c/o blurred vision, one-sided weakness or slurred speech.  STAT if BP is GREATER than 180/120 TODAY.  STAT if BP is LESS than 90/60 and SYMPTOMATIC TODAY  1. What is your BP concern?   Patient is concerned her BP has been trending high and not coming down  2. Have you taken any BP medication today?  Yes.  Regular medications taken around 8:30 am after breakfast  3. What are your last 5 BP readings?  Today,  11:00 am - 146/84               12:30 pm - 171/87  4. Are you having any other symptoms (ex. Dizziness, headache, blurred vision, passed out)? Patient stated her "head feels full" and her ears are bothering her.  Patient stated she walked with her friend this morning and her legs felt weak.   Patient stated she took hydralazine  on Saturday and Sunday.  Patient said she spoke with doctor on call who advised her to take an additional dose of the hydralazine .

## 2023-08-04 NOTE — Telephone Encounter (Signed)
 Called patient back about message. Patient complaining of elevated BPs over the weekend. Patient stated she tried lowering her BP with Hydralazine . She got it down some on Saturday, but it went right back up. Patient stated her BP started off at 180/95 on Sunday and after Hydralazine  x 4 and night time coreg  BP came down to 139/71 HR  64. Patient stated through all this her head feels full and tight. Patient stated that her chest feels full, but has not noted any pressure. Patient also wanted to add that her feet and ankles were swollen on Friday morning before the weekend. Right now patient's BP has started climbing again from 146/84 to 171/87. Encouraged patient to take hydralazine  now and a message would be sent to Dr. Swaziland and his nurse.

## 2023-08-05 DIAGNOSIS — M5135 Other intervertebral disc degeneration, thoracolumbar region: Secondary | ICD-10-CM | POA: Diagnosis not present

## 2023-08-05 MED ORDER — LOSARTAN POTASSIUM 100 MG PO TABS: 100.0000 mg | ORAL_TABLET | Freq: Every day | ORAL | 3 refills | Status: DC

## 2023-08-05 MED ORDER — HYDRALAZINE HCL 25 MG PO TABS: 25.0000 mg | ORAL_TABLET | ORAL | 3 refills | Status: AC | PRN

## 2023-08-05 NOTE — Telephone Encounter (Signed)
 Pt is requesting a callback regarding her f/u. She stated she was told she'd be called back tomorrow but she didn't. Please advise.

## 2023-08-05 NOTE — Telephone Encounter (Signed)
Patient is calling to follow up

## 2023-08-05 NOTE — Telephone Encounter (Signed)
 Called patient with Dr. Christophe Cram recommendations. Patient will increase her Losartan  100 mg and continue hydralazine  PRN. Patient will call our office if her BP continues to be high. Patient verbalized understanding and agreed to plan.

## 2023-08-05 NOTE — Telephone Encounter (Signed)
 Left message to call back

## 2023-08-06 DIAGNOSIS — Z79899 Other long term (current) drug therapy: Secondary | ICD-10-CM | POA: Diagnosis not present

## 2023-08-07 ENCOUNTER — Telehealth: Payer: Self-pay | Admitting: Cardiology

## 2023-08-07 NOTE — Telephone Encounter (Signed)
 Spoke to patient she stated she has been having B/P spikes earlier this week.Stated she was told to increase Losartan  to 100 mg daily.Stated she did not increase due to B/P low.This morning 91/56.At present 153/89.Pulse [id number].Stated she feels good No dizziness.Advised to continue Losartan  50 mg daily.Continue all other medications as prescribed.Advised to monitor B/P and pulse daily.Advised to send readings to mychart in 2 weeks.I will send message to Dr.Jordan for advice.

## 2023-08-07 NOTE — Telephone Encounter (Signed)
 Spoke to patient Dr.Jordan's advice given.

## 2023-08-07 NOTE — Telephone Encounter (Signed)
  Pt c/o BP issue: STAT if pt c/o blurred vision, one-sided weakness or slurred speech.  STAT if BP is GREATER than 180/120 TODAY.  STAT if BP is LESS than 90/60 and SYMPTOMATIC TODAY  1. What is your BP concern? BP is running low  2. Have you taken any BP medication today? Yes, 50 mg Losartan   3. What are your last 5 BP readings? 91/56 (this morning), 109/64 (Wed), 95/53(Thurs), 109/61 (last night)  4. Are you having any other symptoms (ex. Dizziness, headache, blurred vision, passed out)? Denies any symptoms  Patient was told the other day to double her dosage of Losartan  but she never did and now BP is running low.

## 2023-08-10 DIAGNOSIS — M810 Age-related osteoporosis without current pathological fracture: Secondary | ICD-10-CM | POA: Diagnosis not present

## 2023-08-11 DIAGNOSIS — M5135 Other intervertebral disc degeneration, thoracolumbar region: Secondary | ICD-10-CM | POA: Diagnosis not present

## 2023-08-16 ENCOUNTER — Other Ambulatory Visit: Payer: Self-pay | Admitting: Medical Genetics

## 2023-08-17 ENCOUNTER — Ambulatory Visit: Admitting: Podiatry

## 2023-08-17 ENCOUNTER — Encounter: Payer: Self-pay | Admitting: Podiatry

## 2023-08-17 DIAGNOSIS — B351 Tinea unguium: Secondary | ICD-10-CM

## 2023-08-17 DIAGNOSIS — M79676 Pain in unspecified toe(s): Secondary | ICD-10-CM

## 2023-08-17 DIAGNOSIS — L84 Corns and callosities: Secondary | ICD-10-CM | POA: Diagnosis not present

## 2023-08-17 DIAGNOSIS — Z7901 Long term (current) use of anticoagulants: Secondary | ICD-10-CM | POA: Diagnosis not present

## 2023-08-17 NOTE — Progress Notes (Signed)
 Subjective: Chief Complaint  Patient presents with   RFC    RM#14 RFC patient states her would like to talk about orthotic.      83 y.o. returns the office today for painful, elongated, thickened toenails which she is unable to trim herself.  States the nails get painful to get elongated, but they have not grown as much.  She did try some heel supports but did not help.  Has not had inserts made yet.  She is on Plavix .  Objective: AAO 3, NAD DP/PT pulses palpable, CRT less than 3 seconds Nails hypertrophic, dystrophic, elongated, brittle, discolored 10.  Incurvation of the nails mostly on the right without signs of infection today.  There is no drainage or pus today.  There is tenderness overlying the nails 1-5 bilaterally.  No signs of infection.  Ingrown toenails present to multiple toenails without any signs of infection. There is hyperkeratotic lesions of the distal aspect left second digit and there is new callus to the tip of the left fourth toe as well. No underlying ulceration, drainage or other signs of infection.  Hammertoes present No pain with calf compression, warmth, erythema.  Assessment: Symptomatic onychomycosis, ingrown toenails; hyperkeratotic lesion left foot due to hammertoe deformity  Plan: -Treatment options including alternatives, risks, complications were discussed -Nails sharply debrided 10 without any complications or bleeding.  Monitor ingrown toenails. -Hyperkeratotic lesion sharply debrided 1 without complication/bleeding -Have a follow-up with Milana Ali, pedorthist for custom inserts.  We have previously received authorization for this but without the need to resubmit it.  May need to perform a lateral post with a cushioned type insert without any additional arch support.  Charity Conch DPM

## 2023-08-19 ENCOUNTER — Other Ambulatory Visit (HOSPITAL_COMMUNITY)
Admission: RE | Admit: 2023-08-19 | Discharge: 2023-08-19 | Disposition: A | Discharge status: Home / Self Care | Payer: Self-pay | Source: Ambulatory Visit | Attending: Medical Genetics | Admitting: Medical Genetics

## 2023-08-19 DIAGNOSIS — M5135 Other intervertebral disc degeneration, thoracolumbar region: Secondary | ICD-10-CM | POA: Diagnosis not present

## 2023-08-26 DIAGNOSIS — M5135 Other intervertebral disc degeneration, thoracolumbar region: Secondary | ICD-10-CM | POA: Diagnosis not present

## 2023-08-27 ENCOUNTER — Other Ambulatory Visit: Payer: Self-pay | Admitting: Cardiology

## 2023-08-28 LAB — GENECONNECT MOLECULAR SCREEN: Genetic Analysis Overall Interpretation: NEGATIVE

## 2023-09-01 DIAGNOSIS — M5135 Other intervertebral disc degeneration, thoracolumbar region: Secondary | ICD-10-CM | POA: Diagnosis not present

## 2023-09-08 DIAGNOSIS — M5135 Other intervertebral disc degeneration, thoracolumbar region: Secondary | ICD-10-CM | POA: Diagnosis not present

## 2023-09-15 DIAGNOSIS — M5135 Other intervertebral disc degeneration, thoracolumbar region: Secondary | ICD-10-CM | POA: Diagnosis not present

## 2023-09-21 ENCOUNTER — Ambulatory Visit

## 2023-09-21 NOTE — Progress Notes (Signed)
 Patient does not want to pay oop for custom inserts  Added lateral foam piece for patient to try and see if it reduces lateral foot pressure looks like left LLE is longer she may try a right heel lift

## 2023-09-22 DIAGNOSIS — I10 Essential (primary) hypertension: Secondary | ICD-10-CM | POA: Diagnosis not present

## 2023-09-22 DIAGNOSIS — M47816 Spondylosis without myelopathy or radiculopathy, lumbar region: Secondary | ICD-10-CM | POA: Diagnosis not present

## 2023-10-05 ENCOUNTER — Telehealth: Payer: Self-pay | Admitting: Cardiology

## 2023-10-05 NOTE — Telephone Encounter (Signed)
 Pt c/o medication issue:  1. Name of Medication:   clopidogrel  (PLAVIX ) 75 MG tablet   2. How are you currently taking this medication (dosage and times per day)?   As prescribed  3. Are you having a reaction (difficulty breathing--STAT)?   No  4. What is your medication issue?   Patient stated Dr. Swaziland told her she will be coming off this medication in July.  Patient wants to know if she should be tapering off this medication and start taking an alternate medication. Patient stated she will not be home until after 1:00 pm today.

## 2023-10-07 NOTE — Telephone Encounter (Signed)
Pt is calling back to check status of message.

## 2023-10-07 NOTE — Telephone Encounter (Signed)
 No need to taper clopidogrel . She can stop. Must continue aspirin  however

## 2023-10-07 NOTE — Telephone Encounter (Signed)
 Called the patient and gave the information from Pharmacist. She verbalized understanding. Plavix  taken off of med list.

## 2023-10-07 NOTE — Telephone Encounter (Signed)
 Called pt: She was told that she can stop the Plavix  in July. She is worried that if she stops the Plavix  abruptly that it will cause problems- increased risk of clot/plaque buildup. Informed her that I would send her question/concern to her provider and our pharmacy team.

## 2023-10-13 NOTE — Telephone Encounter (Signed)
 Pt would like a c/b due to her being uneasy about stopping her Plavix . Please advise

## 2023-10-13 NOTE — Telephone Encounter (Signed)
 Patient states she was informed at her office visit with Dr. Swaziland in April that she could stop taking her Plavix  in July.  Patient had called in last week to confirm she was to stop taking Plavix  and asked if she needed to taper off of the dose.  Patient was informed that our pharmacist said no tapering was needed for Plavix .  Patient calling again today stating she is nervous to abruptly stop the Plavix . She is concerned with her history and having small blockages that stopping the Plavix  would put her at risk for a heart attack. She states the Plavix  has been sort of like a crutch to her. She would like Dr. Swaziland to advise on tapering off of Plavix .  Will forward to Dr. Swaziland to review and advise.

## 2023-10-13 NOTE — Telephone Encounter (Signed)
 Spoke with patient and shared Dr. Gib response: I am ok with continuing the Plavix  but if she has any bleeding issues let us  know   Peter Swaziland MD, [facility]  Patient states she is so relieved to hear that. She will keep us  updated is she has any issues with bleeding/bruising. She will discuss further with Dr. Swaziland at her appt in October.

## 2023-10-16 ENCOUNTER — Ambulatory Visit: Admitting: Podiatry

## 2023-10-16 VITALS — Ht <= 58 in | Wt 119.8 lb

## 2023-10-16 DIAGNOSIS — Z7901 Long term (current) use of anticoagulants: Secondary | ICD-10-CM

## 2023-10-16 DIAGNOSIS — M79676 Pain in unspecified toe(s): Secondary | ICD-10-CM

## 2023-10-16 DIAGNOSIS — B351 Tinea unguium: Secondary | ICD-10-CM | POA: Diagnosis not present

## 2023-10-16 NOTE — Progress Notes (Signed)
 Subjective: Chief Complaint  Patient presents with   Nail Problem    Rm 12 Patient is here for routine foot care and nail trimming.    83 y.o. returns the office today for painful, elongated, thickened toenails which she is unable to trim herself.  States the nails get painful to get elongated, but they have not grown as much.  She did bring in some inserts that she found at home that she would like me to look at.  She decided to hold off on the custom inserts due to cost and also she wants a very thin insert.   She is on Plavix .  Objective: AAO 3, NAD DP/PT pulses palpable, CRT less than 3 seconds Nails hypertrophic, dystrophic, elongated, brittle, discolored 10.  Incurvation of the nails mostly on the right without signs of infection today.  There is no drainage or pus today.  There is tenderness overlying the nails 1-5 bilaterally.  No signs of infection.  Ingrown toenails present to multiple toenails without any signs of infection. There is hyperkeratotic lesions of the distal aspect left second digit and there is new callus to the tip of the left fourth toe as well. No underlying ulceration, drainage or other signs of infection.  Hammertoes present No pain with calf compression, warmth, erythema.  Assessment: Symptomatic onychomycosis, ingrown toenails; hyperkeratotic lesion left foot due to hammertoe deformity  Plan: -Treatment options including alternatives, risks, complications were discussed -Nails sharply debrided 10 without any complications or bleeding.  Monitor ingrown toenails. -Hyperkeratotic lesion sharply debrided 1 without complication/bleeding -she is going to use the lateral wedge that was made previously for her with her over-the-counter insert.  [name] DPM

## 2023-10-26 ENCOUNTER — Telehealth: Payer: Self-pay | Admitting: Student in an Organized Health Care Education/Training Program

## 2023-10-26 NOTE — Telephone Encounter (Signed)
 Spoke with Ms. Odden about recurrent spike in BP. She takes hydral prn for these and is on coreg  12.5 mg bid, LD 07/27 PM. BP was low 200s/low 100s, took hydral 25 mg PO x3 now, starting around 2200 and LD 10 mins ago. BP now 190s/100s, minimal sx (mild HA, chest fullness) but no severe HA or chest pain. She will take coreg  early now in addition to hydral she just took. Recheck in 45 minutes, if not <190/100 and asx then will take 4th hydralazine  and let me know.

## 2023-11-12 ENCOUNTER — Encounter: Payer: Self-pay | Admitting: Cardiology

## 2023-11-12 ENCOUNTER — Telehealth: Payer: Self-pay | Admitting: *Deleted

## 2023-11-12 NOTE — Telephone Encounter (Signed)
 Georgina Lauraine MATSU [place]   11/12/23  1:55 PM Note Pt is experiencing diarrhea and blood coming from her rectum which she believes could be related to the Plavix  and would like a call back.   Pt would also like to put medication back on her current medication list.      Per pt report - Very bloody, very loose stools - 4 to 5 episodes of dark red blood - bright red on toilet paper. Started today.  No HX: of GI bleed or hemorrhoids.  Advised to hold Clopidogrel  and I will send this information to Dr Swaziland for review.  She had previously been told she could stop it in July but she wanted to continue to take it.  Also advised to contact her PCP.  BP has also been dropping as low as 80/50 - 84/52 HR 70  this week. Dizziness when BP really low    has happened a few times this week. Sits down, drinks water and tries to eat a little salt.  Hypotension resolves. Still taking losartan  50 mg daily, carvedilol  12.5 mg po BID, hydralazine  25 mg as needed for spikes in BP.SABRA  These seem to be random occurences and not related to when she takes her medications etc.  Advised to continue to monitor s/s, blood pressure and HR, use care with position changes and I will forward this information to Dr Swaziland for his review.  Pt aware to contact the office if hypotension occurs again.   Aware she will be notified with Dr Gib recommendations/orders.

## 2023-11-12 NOTE — Telephone Encounter (Signed)
 Please see telephone note from today for more information regarding this message.

## 2023-11-12 NOTE — Telephone Encounter (Signed)
 error

## 2023-11-12 NOTE — Telephone Encounter (Signed)
 Pt is experiencing diarrhea and blood coming from her rectum which she believes could be related to the Plavix  and would like a call back.  Pt would also like to put medication back on her current medication list.

## 2023-11-12 NOTE — Telephone Encounter (Signed)
 Spoke with pt regarding Dr. Gib suggestions. Pt will stop plavix  and has an appointment with her primary care provider tomorrow 8/15. Pt was told to let us  know if she has anymore issues. Pt verbalized understanding. All questions if any were answered.

## 2023-11-13 ENCOUNTER — Other Ambulatory Visit: Payer: Self-pay

## 2023-11-13 ENCOUNTER — Encounter (HOSPITAL_BASED_OUTPATIENT_CLINIC_OR_DEPARTMENT_OTHER): Payer: Self-pay

## 2023-11-13 ENCOUNTER — Emergency Department (HOSPITAL_BASED_OUTPATIENT_CLINIC_OR_DEPARTMENT_OTHER)

## 2023-11-13 ENCOUNTER — Observation Stay (HOSPITAL_BASED_OUTPATIENT_CLINIC_OR_DEPARTMENT_OTHER)
Admission: EM | Admit: 2023-11-13 | Discharge: 2023-11-14 | Disposition: A | Discharge status: Home / Self Care | Attending: Family Medicine | Admitting: Family Medicine

## 2023-11-13 DIAGNOSIS — K5730 Diverticulosis of large intestine without perforation or abscess without bleeding: Secondary | ICD-10-CM | POA: Diagnosis not present

## 2023-11-13 DIAGNOSIS — K922 Gastrointestinal hemorrhage, unspecified: Secondary | ICD-10-CM | POA: Diagnosis not present

## 2023-11-13 DIAGNOSIS — R1012 Left upper quadrant pain: Secondary | ICD-10-CM | POA: Diagnosis not present

## 2023-11-13 DIAGNOSIS — I2510 Atherosclerotic heart disease of native coronary artery without angina pectoris: Secondary | ICD-10-CM

## 2023-11-13 DIAGNOSIS — Z1152 Encounter for screening for COVID-19: Secondary | ICD-10-CM | POA: Diagnosis not present

## 2023-11-13 DIAGNOSIS — Z743 Need for continuous supervision: Secondary | ICD-10-CM | POA: Diagnosis not present

## 2023-11-13 DIAGNOSIS — E785 Hyperlipidemia, unspecified: Secondary | ICD-10-CM | POA: Diagnosis not present

## 2023-11-13 DIAGNOSIS — K219 Gastro-esophageal reflux disease without esophagitis: Secondary | ICD-10-CM | POA: Diagnosis present

## 2023-11-13 DIAGNOSIS — I110 Hypertensive heart disease with heart failure: Secondary | ICD-10-CM | POA: Insufficient documentation

## 2023-11-13 DIAGNOSIS — K921 Melena: Principal | ICD-10-CM

## 2023-11-13 DIAGNOSIS — Z7982 Long term (current) use of aspirin: Secondary | ICD-10-CM | POA: Insufficient documentation

## 2023-11-13 DIAGNOSIS — I10 Essential (primary) hypertension: Secondary | ICD-10-CM | POA: Diagnosis not present

## 2023-11-13 DIAGNOSIS — C541 Malignant neoplasm of endometrium: Secondary | ICD-10-CM | POA: Diagnosis present

## 2023-11-13 DIAGNOSIS — I5042 Chronic combined systolic (congestive) and diastolic (congestive) heart failure: Secondary | ICD-10-CM | POA: Diagnosis not present

## 2023-11-13 DIAGNOSIS — Z853 Personal history of malignant neoplasm of breast: Secondary | ICD-10-CM | POA: Diagnosis not present

## 2023-11-13 DIAGNOSIS — K429 Umbilical hernia without obstruction or gangrene: Secondary | ICD-10-CM | POA: Diagnosis not present

## 2023-11-13 LAB — CBC
HCT: 38.3 % (ref 36.0–46.0)
Hemoglobin: 11.8 g/dL — ABNORMAL LOW (ref 12.0–15.0)
MCH: 28.6 pg (ref 26.0–34.0)
MCHC: 30.8 g/dL (ref 30.0–36.0)
MCV: 92.7 fL (ref 80.0–100.0)
Platelets: 209 10*3/uL (ref 150–400)
RBC: 4.13 MIL/uL (ref 3.87–5.11)
RDW: 13.4 % (ref 11.5–15.5)
WBC: 7.7 10*3/uL (ref 4.0–10.5)
nRBC: 0 % (ref 0.0–0.2)

## 2023-11-13 LAB — TYPE AND SCREEN
ABO/RH(D): A NEG
Antibody Screen: NEGATIVE

## 2023-11-13 LAB — COMPREHENSIVE METABOLIC PANEL WITH GFR
ALT: 15 U/L (ref 0–44)
AST: 24 U/L (ref 15–41)
Albumin: 4.4 g/dL (ref 3.5–5.0)
Alkaline Phosphatase: 46 U/L (ref 38–126)
Anion gap: 13 (ref 5–15)
BUN: 14 mg/dL (ref 8–23)
CO2: 22 mmol/L (ref 22–32)
Calcium: 9.8 mg/dL (ref 8.9–10.3)
Chloride: 100 mmol/L (ref 98–111)
Creatinine, Ser: 0.78 mg/dL (ref 0.44–1.00)
GFR, Estimated: >60 mL/min (ref >60)
Glucose, Bld: 103 mg/dL — ABNORMAL HIGH (ref 70–99)
Potassium: 4.3 mmol/L (ref 3.5–5.1)
Sodium: 135 mmol/L (ref 135–145)
Total Bilirubin: 0.5 mg/dL (ref 0.0–1.2)
Total Protein: 7 g/dL (ref 6.5–8.1)

## 2023-11-13 LAB — CBC WITH DIFFERENTIAL/PLATELET
Abs Immature Granulocytes: 0.03 K/uL (ref 0.00–0.07)
Basophils Absolute: 0.1 K/uL (ref 0.0–0.1)
Basophils Relative: 1 %
Eosinophils Absolute: 0.3 K/uL (ref 0.0–0.5)
Eosinophils Relative: 4 %
HCT: 35.2 % — ABNORMAL LOW (ref 36.0–46.0)
Hemoglobin: 11.5 g/dL — ABNORMAL LOW (ref 12.0–15.0)
Immature Granulocytes: 0 %
Lymphocytes Relative: 27 %
Lymphs Abs: 2.3 K/uL (ref 0.7–4.0)
MCH: 28.9 pg (ref 26.0–34.0)
MCHC: 32.7 g/dL (ref 30.0–36.0)
MCV: 88.4 fL (ref 80.0–100.0)
Monocytes Absolute: 0.8 K/uL (ref 0.1–1.0)
Monocytes Relative: 10 %
Neutro Abs: 4.9 K/uL (ref 1.7–7.7)
Neutrophils Relative %: 58 %
Platelets: 215 K/uL (ref 150–400)
RBC: 3.98 MIL/uL (ref 3.87–5.11)
RDW: 13.4 % (ref 11.5–15.5)
WBC: 8.5 K/uL (ref 4.0–10.5)
nRBC: 0 % (ref 0.0–0.2)

## 2023-11-13 LAB — PROTIME-INR
INR: 1 (ref 0.8–1.2)
Prothrombin Time: 13.8 s (ref 11.4–15.2)

## 2023-11-13 LAB — OCCULT BLOOD X 1 CARD TO LAB, STOOL: Fecal Occult Bld: POSITIVE — AB

## 2023-11-13 MED ORDER — IOHEXOL 350 MG/ML SOLN
100.0000 mL | Freq: Once | INTRAVENOUS | Status: AC | PRN
  Administered 2023-11-13: 100 mL via INTRAVENOUS

## 2023-11-13 MED ORDER — ONDANSETRON HCL 4 MG/2ML IJ SOLN: 4.0000 mg | Freq: Four times a day (QID) | INTRAMUSCULAR | Status: DC | PRN

## 2023-11-13 MED ORDER — ATORVASTATIN CALCIUM 40 MG PO TABS
40.0000 mg | ORAL_TABLET | Freq: Every day | ORAL | Status: DC
Start: 2023-11-14 — End: 2023-11-14
  Administered 2023-11-14: 40 mg via ORAL
  Filled 2023-11-13: qty 1

## 2023-11-13 MED ORDER — LOSARTAN POTASSIUM 50 MG PO TABS
50.0000 mg | ORAL_TABLET | Freq: Every day | ORAL | Status: DC
  Administered 2023-11-14: 50 mg via ORAL
  Filled 2023-11-13: qty 1

## 2023-11-13 MED ORDER — ONDANSETRON HCL 4 MG PO TABS: 4.0000 mg | ORAL_TABLET | Freq: Four times a day (QID) | ORAL | Status: DC | PRN

## 2023-11-13 MED ORDER — CARVEDILOL 12.5 MG PO TABS
12.5000 mg | ORAL_TABLET | Freq: Two times a day (BID) | ORAL | Status: DC
  Administered 2023-11-13 – 2023-11-14 (×2): 12.5 mg via ORAL
  Filled 2023-11-13 (×2): qty 1

## 2023-11-13 MED ORDER — PANTOPRAZOLE SODIUM 40 MG IV SOLR
40.0000 mg | Freq: Two times a day (BID) | INTRAVENOUS | Status: DC
  Administered 2023-11-14 (×2): 40 mg via INTRAVENOUS
  Filled 2023-11-13 (×2): qty 10

## 2023-11-13 MED ORDER — ACETAMINOPHEN 650 MG RE SUPP: 650.0000 mg | Freq: Four times a day (QID) | RECTAL | Status: DC | PRN

## 2023-11-13 MED ORDER — DAPAGLIFLOZIN PROPANEDIOL 10 MG PO TABS
10.0000 mg | ORAL_TABLET | Freq: Every day | ORAL | Status: DC
  Filled 2023-11-13: qty 1

## 2023-11-13 MED ORDER — HYDRALAZINE HCL 25 MG PO TABS: 25.0000 mg | ORAL_TABLET | ORAL | Status: DC | PRN

## 2023-11-13 MED ORDER — PANTOPRAZOLE SODIUM 40 MG IV SOLR
40.0000 mg | INTRAVENOUS | Status: AC
  Administered 2023-11-13 (×2): 40 mg via INTRAVENOUS
  Filled 2023-11-13 (×2): qty 10

## 2023-11-13 MED ORDER — ACETAMINOPHEN 325 MG PO TABS: 650.0000 mg | ORAL_TABLET | Freq: Four times a day (QID) | ORAL | Status: DC | PRN

## 2023-11-13 MED ORDER — HYDRALAZINE HCL 25 MG PO TABS
25.0000 mg | ORAL_TABLET | Freq: Two times a day (BID) | ORAL | Status: DC | PRN
  Administered 2023-11-13: 25 mg via ORAL
  Filled 2023-11-13 (×2): qty 1

## 2023-11-13 NOTE — Assessment & Plan Note (Signed)
 Continue lipitor 40mg  daily.

## 2023-11-13 NOTE — ED Triage Notes (Addendum)
 Patient reports bright red blood in her stool starting yesterday. She states it has been very loose and has had multiple loose bowel movements. She reports today she saw her PCP and she had an episode of black tarry stool before arriving in the ER.   She reports she is on Plavix  and the PCP reported her Hemoglobin was lower than her baseline and suggested she come here.

## 2023-11-13 NOTE — Assessment & Plan Note (Addendum)
 S/p bilateral mastectomy and chemo

## 2023-11-13 NOTE — Assessment & Plan Note (Signed)
 Endometrioid uterine cancer, FIGO grade II.   No evidence of recurrence.  Status post robotic hysterectomy with BSO.   Status post vaginal brachytherapy.

## 2023-11-13 NOTE — ED Notes (Signed)
 Called [facility] to transport the patient to [facility] 4[address] rm# [id number]

## 2023-11-13 NOTE — H&P (Signed)
 History and Physical    Patient: Beth Simon FMW:[id number] DOB: 1941-01-25 DOA: 11/13/2023 DOS: the patient was seen and examined on 11/13/2023 PCP: Dwight Trula SQUIBB, MD  Patient coming from: [facility] - lives alone. Ambulates independently    Chief Complaint: blood in stool   HPI: Beth Simon is a 83 y.o. female with medical history significant of HTN, HLD, CAD with NSTEMI, chronic combined CHF with EF of 30-35% on echo in 09/2022, hx of breast cancer s/p mastectomy and chemo, hx of uterine cancer s/p hysterectomy and radiation who presented to ED with complaints of rectal bleeding. Yesterday around noon she felt like she was going to have diarrhea and had to rush to the bathroom. She states it was bloody (dark red with bright red on the toilet paper) and had diarrhea consistency. She had about 5-6 episodes of these. She had no associated abdominal pain, but fierce diarrhea/liquid stool. She had no episodes over night or in the morning.  At lunch she had a normal quality stool, but it was black all the way through. She has had no vomiting.   She denies any NSAIDs. Her last colonoscopy was when she was 83 years old. She had a few polyps only and was told she no longer needed them. She has never had an EGD.   She has had some low blood pressures this week with some associated light headedness .  He has been feeling good, but tired.  Denies any fever/chills, vision changes/headaches, chest pain or palpitations, shortness of breath or cough, abdominal pain, N/V/ dysuria or leg swelling.    She does not smoke or drink alcohol.   ER Course:  vitals: afebrile, bp: 191/80, HR; 81 RR: 18, oxygen: 99%RA Pertinent labs: hgb: 11.5, fecal occult positive  CTA GI bleed: . No acute inflammatory process identified within the abdomen or pelvis. No active extravasation of contrast to suggest active GI bleeding. No bowel obstruction. In ED: GI consulted. Given PPI and TRH asked to admit    Review of Systems:  As mentioned in the history of present illness. All other systems reviewed and are negative. Past Medical History:  Diagnosis Date   Adenomatous polyps 01/2016   Anemia    Breast cancer (HCC) 1990   bilateral mastectomy, adenoca breast-left MRM, reconstruction, chemo   Bronchitis last 2 weeks   saw dr roseann 02-28-2014, he said no antibiotics needed, nonproductive cough   Complication of anesthesia    Cystitis    cytoxen, had once or twice   Family history of breast cancer    History of breast cancer    History of radiation therapy 2/10, 2/12, 2/18, 2/24, 05/29/14   vaginal cuff/ 30 Gy/ 5 fx   Hypertension    Macular degeneration    Neck pain    taking physictal therapy last 2 weeks   Osteoporosis    On Prolia .   PONV (postoperative nausea and vomiting)    Shoulder pain    taking physical therapy for last few weeks   Uterine cancer (HCC) 03/21/2014   MLH1/PMS2 LOH   Uterine fibroid    VAIN I (vaginal intraepithelial neoplasia grade I) 2018   positive HR HPV.   Vasovagal syncopes    Past Surgical History:  Procedure Laterality Date   BREAST IMPLANT REMOVAL  6/94   left breast   BREAST RECONSTRUCTION  1990   left   CATARACT EXTRACTION Bilateral    EXCISION VAGINAL CYST     MASTECTOMY Right 10/08/06  prophylactic   RADICAL MASTECTOMY LND  1990   left, chemo done   RIGHT/LEFT HEART CATH AND CORONARY ANGIOGRAPHY N/A 10/17/2022   Procedure: RIGHT/LEFT HEART CATH AND CORONARY ANGIOGRAPHY;  Surgeon: Anner Alm ORN, MD;  Location: [facility] INVASIVE CV LAB;  Service: Cardiovascular;  Laterality: N/A;   ROBOTIC ASSISTED TOTAL HYSTERECTOMY WITH BILATERAL SALPINGO OOPHERECTOMY Bilateral 03/21/2014   Procedure: ROBOTIC ASSISTED TOTAL HYSTERECTOMY WITH BILATERAL SALPINGO OOPHORECTOMY ;  Surgeon: Sari Bachelor, MD;  Location: [facility];  Service: Gynecology;  Laterality: Bilateral;   Social History:  reports that she has never smoked. She has never been exposed to tobacco smoke. She has  never used smokeless tobacco. She reports that she does not drink alcohol and does not use drugs.  Allergies  Allergen Reactions   Celecoxib Hypertension   Kenalog  [Triamcinolone ]     Leg weakness/burning  injection   Prednisone Other (See Comments)    Unsteady gait per MD instructions  NO STEROIDS   Procaine Palpitations    Heart palpitations    Family History  Problem Relation Age of Onset   Breast cancer Sister 65       recurrence age 46   Breast cancer Paternal Grandmother    Heart disease Mother    Heart disease Father    Hypertension Sister    Dementia Sister    Breast cancer Maternal Aunt 46   Dementia Maternal Aunt        dx in her 91s    Prior to Admission medications   Medication Sig Start Date End Date Taking? Authorizing Provider  aspirin  EC (ASPIRIN  LOW DOSE) 81 MG tablet TAKE 1 TABLET (81 MG TOTAL) BY MOUTH DAILY. SWALLOW WHOLE. 11/28/22   [place], Peter M, MD  atorvastatin  (LIPITOR) 40 MG tablet TAKE 1 TABLET BY MOUTH EVERY DAY 11/28/22   [place], Peter M, MD  carvedilol  (COREG ) 12.5 MG tablet TAKE 1 TABLET BY MOUTH 2 TIMES DAILY. 08/27/23   [place], Peter M, MD  Cyanocobalamin (VITAMIN B 12 PO) Take 1 tablet by mouth every morning.     [provider]  dapagliflozin  propanediol (FARXIGA ) 10 MG TABS tablet Take 1 tablet (10 mg total) by mouth daily before breakfast. 12/12/22   Goodrich, Callie E, PA-C  denosumab  (PROLIA ) 60 MG/ML SOLN injection Inject 60 mg into the skin every 6 (six) months. Administer in upper arm, thigh, or abdomen    [provider]  famotidine (PEPCID) 40 MG tablet Take 40 mg by mouth daily. 01/04/23   [provider]  Flaxseed, Linseed, (FLAX SEEDS PO) Take 1 scoop by mouth every morning. Ground flaxseed in cereal.    [provider]  hydrALAZINE  (APRESOLINE ) 25 MG tablet Take 1 tablet (25 mg total) by mouth as needed (for blood pressure greater than 160/100. May take up to twice daily.). 08/05/23   [place], Peter  M, MD  losartan  (COZAAR ) 50 MG tablet Take 1 tablet (50 mg total) by mouth daily. 08/07/23   [place], Peter M, MD  Multiple Vitamins-Minerals (PRESERVISION AREDS) CAPS Take 1 capsule by mouth daily.    [provider]  NONFORMULARY OR COMPOUNDED ITEM Prosthetic bra.  Please supply 2 bras.  Refill:  2. 02/16/23   Cathlyn JAYSON Nikki Bobie FORBES, MD  OVER THE COUNTER MEDICATION Take 1 tablet by mouth every morning. Astazanthin. (anti-oxidant)    [provider]  VITAMIN D, CHOLECALCIFEROL, PO Take 4,000 Int'l Units by mouth every morning.     [provider]  Physical Exam: Vitals:   11/13/23 1441 11/13/23 1815 11/13/23 1818 11/13/23 1917  BP: (!) 162/70 (!) 202/86 (!) 184/88   Pulse: 66 64  64  Resp: 14 17    Temp:  (!) 97.5 F (36.4 C)    TempSrc:  Oral    SpO2: 99% 100%     General:  Appears calm and comfortable and is in NAD Eyes:  PERRL, EOMI, normal lids, iris ENT:  grossly normal hearing, lips & tongue, mmm; appropriate dentition Neck:  no LAD, masses or thyromegaly; no carotid bruits Cardiovascular:  RRR, no m/r/g. No LE edema.  Respiratory:   CTA bilaterally with no wheezes/rales/rhonchi.  Normal respiratory effort. Abdomen:  soft, NT, ND, NABS Back:   normal alignment, no CVAT Skin:  no rash or induration seen on limited exam Musculoskeletal:  grossly normal tone BUE/BLE, good ROM, no bony abnormality Lower extremity:  No LE edema.  Limited foot exam with no ulcerations.  2+ distal pulses. Psychiatric:  grossly normal mood and affect, speech fluent and appropriate, AOx3 Neurologic:  CN 2-12 grossly intact, moves all extremities in coordinated fashion, sensation intact   Radiological Exams on Admission: Independently reviewed - see discussion in A/P where applicable  CT ANGIO GI BLEED Result Date: 11/13/2023 CLINICAL DATA:  LUQ abdominal pain. Dark and bright red blood per rectum with melena. History of endometrial carcinoma. * Tracking Code: BO *  EXAM: CTA ABDOMEN AND PELVIS WITHOUT AND WITH CONTRAST TECHNIQUE: Multidetector CT imaging of the abdomen and pelvis was performed using the standard protocol during bolus administration of intravenous contrast. Multiplanar reconstructed images and MIPs were obtained and reviewed to evaluate the vascular anatomy. RADIATION DOSE REDUCTION: This exam was performed according to the departmental dose-optimization program which includes automated exposure control, adjustment of the mA and/or kV according to patient size and/or use of iterative reconstruction technique. CONTRAST:  OMNIPAQUE  IOHEXOL  350 MG/ML SOLN COMPARISON:  Nuclear medicine PET scan from 05/09/2014. FINDINGS: VASCULAR Aorta: Normal caliber aorta without aneurysm, dissection, vasculitis or significant stenosis. Mild-to-moderate peripheral atherosclerotic vascular calcifications noted throughout the abdominal aorta and its major branches. Celiac: Patent without evidence of aneurysm, dissection, vasculitis or significant stenosis. SMA: Patent without evidence of aneurysm, dissection, vasculitis or significant stenosis. Renals: Both renal arteries are patent without evidence of aneurysm, dissection, vasculitis, fibromuscular dysplasia or significant stenosis. IMA: Patent without evidence of aneurysm, dissection, vasculitis or significant stenosis. Inflow: Patent without evidence of aneurysm, dissection, vasculitis or significant stenosis. Proximal Outflow: Bilateral common femoral and visualized portions of the superficial and profunda femoral arteries are patent without evidence of aneurysm, dissection, vasculitis or significant stenosis. Veins: No obvious venous abnormality within the limitations of this arterial phase study. Review of the MIP images confirms the above findings. NON-VASCULAR Lower chest: There are patchy atelectatic changes in the visualized lung bases. No overt consolidation. No pleural effusion. The heart is normal in size. No  pericardial effusion. Hepatobiliary: The liver is normal in size. Non-cirrhotic configuration. No suspicious mass. No intrahepatic or extrahepatic bile duct dilation. No calcified gallstones. Normal gallbladder wall thickness. No pericholecystic inflammatory changes. Pancreas: Unremarkable. No pancreatic ductal dilatation or surrounding inflammatory changes. Spleen: Within normal limits. No focal lesion. Adrenals/Urinary Tract: Adrenal glands are unremarkable. No suspicious renal mass. No hydronephrosis. No renal or ureteric calculi. Unremarkable urinary bladder. Stomach/Bowel: No disproportionate dilation of the small or large bowel loops. No evidence of abnormal bowel wall thickening or inflammatory changes. The appendix is unremarkable. There are multiple diverticula throughout the colon,  without imaging signs of diverticulitis. No active extravasation of contrast noted to suggest active GI bleeding. Vascular/Lymphatic: No ascites or pneumoperitoneum. No abdominal or pelvic lymphadenopathy, by size criteria. Reproductive: The uterus is surgically absent. No large adnexal mass. Other: There is a tiny fat containing umbilical hernia. The soft tissues and abdominal wall are otherwise unremarkable. Musculoskeletal: No suspicious osseous lesions. There are mild multilevel degenerative changes in the visualized spine. IMPRESSION: 1. No acute inflammatory process identified within the abdomen or pelvis. No active extravasation of contrast to suggest active GI bleeding. No bowel obstruction. 2. Multiple other nonacute observations, as described above. Aortic Atherosclerosis (ICD10-I70.0). Electronically Signed   By: Ree Molt M.D.   On: 11/13/2023 15:41    Labs on Admission: I have personally reviewed the available labs and imaging studies at the time of the admission.  Pertinent labs:   hgb: 11.5,  fecal occult positive   Assessment and Plan: Principal Problem:   GI bleed Active Problems:   CAD  (coronary artery disease)   Essential hypertension   Chronic combined systolic and diastolic CHF (congestive heart failure) (HCC)   Endometrial cancer (HCC)   History of breast cancer   Hyperlipidemia   Gastro-esophageal reflux disease without esophagitis    Assessment and Plan: * GI bleed 83 year old presenting to ED with 2 day history of recurrent painless blood in stool then dark/black stool  -place in obs -CTA GI with no active bleed  -she is currently on plavix /ASA. Last took plavix  this AM. Hold both of these  -type and screen and CBC q 6 hours -hgb today was 11.5, her last hgb was in 12/24 and was 13.5 -continue PPI q 12 hours  -full liquid diet -on plavix  will likely wait on colonoscopy, but will make NPO at midnight  -no hx of NSAIDs or previous UGIB or LGIB, last colonoscopy 4 years ago at 83 years of age  -GI consulted, [facility].   CAD (coronary artery disease) - hx of NSTEMI in 09/2022 - LHC showed severe diffuse LAD disease and focal left PDA disease. Anatomy not favorable for PCI so medical therapy recommended. DAPT until July 2025 then ASA alone  -Differential felt to be NSTEMI due to demand ischemia vs small aborted type 1 myocardial infarction vs Takotsubo syndrome. Subsequent Echo showed normalization c/w this being more of a stress induced CM  -continue medical management with statin and beta blocker -she called in July and was scared to stop plavix  and wanted to continue this. Dr. [place] agreed for her to continue this so she has continued her DAPT  -last plavix  was yesterday AM and her ASA was this morning   -holding ASA/plavix  in setting of GIB   Essential hypertension Continue losartan  50mg  daily, coreg  12.5mg  Bid  Hydralazine  PRN  Hx of labile blood pressures with intermittent spikes. She reports systolic in the 80s this week   Chronic combined systolic and diastolic CHF (congestive heart failure) (HCC) -Echo in 03/2023 showed normalization of her EF back  to 60-65%. Grade 1 DD -daily weights   Endometrial cancer (HCC) Endometrioid uterine cancer, FIGO grade II.   No evidence of recurrence.  Status post robotic hysterectomy with BSO.   Status post vaginal brachytherapy.  History of breast cancer S/p bilateral mastectomy and chemo   Hyperlipidemia Continue lipitor 40mg  daily   Gastro-esophageal reflux disease without esophagitis On IV PPI     Advance Care Planning:   Code Status: Full Code   Consults: GI: [facility]  DVT Prophylaxis: SCDs  Family Communication: none   Severity of Illness: The appropriate patient status for this patient is OBSERVATION. Observation status is judged to be reasonable and necessary in order to provide the required intensity of service to ensure the patient's safety. The patient's presenting symptoms, physical exam findings, and initial radiographic and laboratory data in the context of their medical condition is felt to place them at decreased risk for further clinical deterioration. Furthermore, it is anticipated that the patient will be medically stable for discharge from the hospital within 2 midnights of admission.   Author: Isaiah Geralds, MD 11/13/2023 8:20 PM  For on call review [email or web address].

## 2023-11-13 NOTE — Assessment & Plan Note (Addendum)
 Continue losartan  50mg  daily, coreg  12.5mg  Bid  Hydralazine  PRN  Hx of labile blood pressures with intermittent spikes. She reports systolic in the 80s this week

## 2023-11-13 NOTE — Assessment & Plan Note (Addendum)
 83 year old presenting to ED with 2 day history of recurrent painless blood in stool then dark/black stool  -place in obs -CTA GI with no active bleed  -she is currently on plavix /ASA. Last took plavix  this AM. Hold both of these  -type and screen and CBC q 6 hours -hgb today was 11.5, her last hgb was in 12/24 and was 13.5 -continue PPI q 12 hours  -full liquid diet -on plavix  will likely wait on colonoscopy, but will make NPO at midnight  -no hx of NSAIDs or previous UGIB or LGIB, last colonoscopy 4 years ago at 83 years of age  -GI consulted, [facility].

## 2023-11-13 NOTE — Assessment & Plan Note (Signed)
 On IV PPI

## 2023-11-13 NOTE — Assessment & Plan Note (Addendum)
-  Echo in 03/2023 showed normalization of her EF back to 60-65%. Grade 1 DD -daily weights

## 2023-11-13 NOTE — Assessment & Plan Note (Addendum)
-   hx of NSTEMI in 09/2022 - LHC showed severe diffuse LAD disease and focal left PDA disease. Anatomy not favorable for PCI so medical therapy recommended. DAPT until July 2025 then ASA alone  -Differential felt to be NSTEMI due to demand ischemia vs small aborted type 1 myocardial infarction vs Takotsubo syndrome. Subsequent Echo showed normalization c/w this being more of a stress induced CM  -continue medical management with statin and beta blocker -she called in July and was scared to stop plavix  and wanted to continue this. Dr. [place] agreed for her to continue this so she has continued her DAPT  -last plavix  was yesterday AM and her ASA was this morning   -holding ASA/plavix  in setting of GIB

## 2023-11-13 NOTE — ED Provider Notes (Signed)
 [place] EMERGENCY DEPARTMENT AT [facility] Provider Note   CSN: [id number] Arrival date & time: 11/13/23  1238     Patient presents with: Rectal Bleeding   Beth Simon is a 83 y.o. female with history of hypertension, hyperlipidemia, [place]ubo presents to the emergency department today for evaluation of melenic stools since yesterday.  Patient reports that yesterday she felt like she was going to have diarrhea and had 5-6 episodes of black stool with some blood.  Had some bright red blood per rectum as well mixed with some dark blood per patient.  She never had any abdominal pain and still does not.  She did not have any nausea or vomiting.  She reports that she was seen at her PCP office this morning.  Afterwards, she had a bowel movement that she reports was firm and not painful but was still very dark in coloration and still had some blood whenever she wipes on the toilet tissue.  She denies any history of hemorrhoids.  She denies any pain with having a bowel movement since then.  Patient is on clopidogrel  and was told to stop in July however continue to take it.  She is also on aspirin  as well.  She denies any tobacco, EtOH illicit drug use.  She reports that she had a normal diet for her.  No recent travel or any raw food consumption.  Denies any fever.  Last colonoscopy was at 79.   Rectal Bleeding Associated symptoms: no abdominal pain, no fever and no vomiting        Prior to Admission medications   Medication Sig Start Date End Date Taking? Authorizing Provider  aspirin  EC (ASPIRIN  LOW DOSE) 81 MG tablet TAKE 1 TABLET (81 MG TOTAL) BY MOUTH DAILY. SWALLOW WHOLE. 11/28/22   [place], Peter M, MD  atorvastatin  (LIPITOR) 40 MG tablet TAKE 1 TABLET BY MOUTH EVERY DAY 11/28/22   [place], Peter M, MD  carvedilol  (COREG ) 12.5 MG tablet TAKE 1 TABLET BY MOUTH 2 TIMES DAILY. 08/27/23   [place], Peter M, MD  Cyanocobalamin (VITAMIN B 12 PO) Take 1 tablet by mouth every morning.      [provider]  dapagliflozin  propanediol (FARXIGA ) 10 MG TABS tablet Take 1 tablet (10 mg total) by mouth daily before breakfast. 12/12/22   Goodrich, Callie E, PA-C  denosumab  (PROLIA ) 60 MG/ML SOLN injection Inject 60 mg into the skin every 6 (six) months. Administer in upper arm, thigh, or abdomen    [provider]  famotidine (PEPCID) 40 MG tablet Take 40 mg by mouth daily. 01/04/23   [provider]  Flaxseed, Linseed, (FLAX SEEDS PO) Take 1 scoop by mouth every morning. Ground flaxseed in cereal.    [provider]  hydrALAZINE  (APRESOLINE ) 25 MG tablet Take 1 tablet (25 mg total) by mouth as needed (for blood pressure greater than 160/100. May take up to twice daily.). 08/05/23   [place], Peter M, MD  losartan  (COZAAR ) 50 MG tablet Take 1 tablet (50 mg total) by mouth daily. 08/07/23   [place], Peter M, MD  Multiple Vitamins-Minerals (PRESERVISION AREDS) CAPS Take 1 capsule by mouth daily.    [provider]  NONFORMULARY OR COMPOUNDED ITEM Prosthetic bra.  Please supply 2 bras.  Refill:  2. 02/16/23   Cathlyn JAYSON Nikki Bobie FORBES, MD  OVER THE COUNTER MEDICATION Take 1 tablet by mouth every morning. Astazanthin. (anti-oxidant)    [provider]  VITAMIN D, CHOLECALCIFEROL, PO Take 4,000 Int'l Units  by mouth every morning.     [provider]    Allergies: Celecoxib, Kenalog  [triamcinolone ], Prednisone, and Procaine    Review of Systems  Constitutional:  Negative for chills and fever.  Respiratory:  Negative for shortness of breath.   Cardiovascular:  Negative for chest pain.  Gastrointestinal:  Positive for blood in stool, diarrhea and hematochezia. Negative for abdominal pain, constipation, nausea, rectal pain and vomiting.  Genitourinary:  Negative for dysuria and hematuria.    Updated Vital Signs BP (!) 162/70   Pulse 66   Temp 98.7 F (37.1 C)   Resp 14   LMP 03/31/1988 (Approximate)   SpO2 99%   Physical Exam Vitals  and nursing note reviewed. Exam conducted with a chaperone present Denson, tech).  Constitutional:      General: She is not in acute distress.    Appearance: She is not ill-appearing or toxic-appearing.  Eyes:     General: No scleral icterus. Cardiovascular:     Rate and Rhythm: Normal rate.  Pulmonary:     Effort: Pulmonary effort is normal. No respiratory distress.  Abdominal:     Palpations: Abdomen is soft.     Tenderness: There is no abdominal tenderness. There is no guarding or rebound.  Genitourinary:     Comments: 2 very small external hemorrhoids, not thrombosed, not actively bleeding.  On digital rectal exam, patient has melenic stool but no active bleeding. Skin:    General: Skin is warm and dry.  Neurological:     Mental Status: She is alert.     (all labs ordered are listed, but only abnormal results are displayed) Labs Reviewed  CBC WITH DIFFERENTIAL/PLATELET - Abnormal; Notable for the following components:      Result Value   Hemoglobin 11.5 (*)    HCT 35.2 (*)    All other components within normal limits  COMPREHENSIVE METABOLIC PANEL WITH GFR - Abnormal; Notable for the following components:   Glucose, Bld 103 (*)    All other components within normal limits  OCCULT BLOOD X 1 CARD TO LAB, STOOL - Abnormal; Notable for the following components:   Fecal Occult Bld POSITIVE (*)    All other components within normal limits  POC OCCULT BLOOD, ED    EKG: None  Radiology: CT ANGIO GI BLEED Result Date: 11/13/2023 CLINICAL DATA:  LUQ abdominal pain. Dark and bright red blood per rectum with melena. History of endometrial carcinoma. * Tracking Code: BO * EXAM: CTA ABDOMEN AND PELVIS WITHOUT AND WITH CONTRAST TECHNIQUE: Multidetector CT imaging of the abdomen and pelvis was performed using the standard protocol during bolus administration of intravenous contrast. Multiplanar reconstructed images and MIPs were obtained and reviewed to evaluate the vascular anatomy.  RADIATION DOSE REDUCTION: This exam was performed according to the departmental dose-optimization program which includes automated exposure control, adjustment of the mA and/or kV according to patient size and/or use of iterative reconstruction technique. CONTRAST:  OMNIPAQUE  IOHEXOL  350 MG/ML SOLN COMPARISON:  Nuclear medicine PET scan from 05/09/2014. FINDINGS: VASCULAR Aorta: Normal caliber aorta without aneurysm, dissection, vasculitis or significant stenosis. Mild-to-moderate peripheral atherosclerotic vascular calcifications noted throughout the abdominal aorta and its major branches. Celiac: Patent without evidence of aneurysm, dissection, vasculitis or significant stenosis. SMA: Patent without evidence of aneurysm, dissection, vasculitis or significant stenosis. Renals: Both renal arteries are patent without evidence of aneurysm, dissection, vasculitis, fibromuscular dysplasia or significant stenosis. IMA: Patent without evidence of aneurysm, dissection, vasculitis or significant stenosis. Inflow: Patent without evidence  of aneurysm, dissection, vasculitis or significant stenosis. Proximal Outflow: Bilateral common femoral and visualized portions of the superficial and profunda femoral arteries are patent without evidence of aneurysm, dissection, vasculitis or significant stenosis. Veins: No obvious venous abnormality within the limitations of this arterial phase study. Review of the MIP images confirms the above findings. NON-VASCULAR Lower chest: There are patchy atelectatic changes in the visualized lung bases. No overt consolidation. No pleural effusion. The heart is normal in size. No pericardial effusion. Hepatobiliary: The liver is normal in size. Non-cirrhotic configuration. No suspicious mass. No intrahepatic or extrahepatic bile duct dilation. No calcified gallstones. Normal gallbladder wall thickness. No pericholecystic inflammatory changes. Pancreas: Unremarkable. No pancreatic ductal  dilatation or surrounding inflammatory changes. Spleen: Within normal limits. No focal lesion. Adrenals/Urinary Tract: Adrenal glands are unremarkable. No suspicious renal mass. No hydronephrosis. No renal or ureteric calculi. Unremarkable urinary bladder. Stomach/Bowel: No disproportionate dilation of the small or large bowel loops. No evidence of abnormal bowel wall thickening or inflammatory changes. The appendix is unremarkable. There are multiple diverticula throughout the colon, without imaging signs of diverticulitis. No active extravasation of contrast noted to suggest active GI bleeding. Vascular/Lymphatic: No ascites or pneumoperitoneum. No abdominal or pelvic lymphadenopathy, by size criteria. Reproductive: The uterus is surgically absent. No large adnexal mass. Other: There is a tiny fat containing umbilical hernia. The soft tissues and abdominal wall are otherwise unremarkable. Musculoskeletal: No suspicious osseous lesions. There are mild multilevel degenerative changes in the visualized spine. IMPRESSION: 1. No acute inflammatory process identified within the abdomen or pelvis. No active extravasation of contrast to suggest active GI bleeding. No bowel obstruction. 2. Multiple other nonacute observations, as described above. Aortic Atherosclerosis (ICD10-I70.0). Electronically Signed   By: Ree Molt M.D.   On: 11/13/2023 15:41    Procedures   Medications Ordered in the ED  pantoprazole  (PROTONIX ) injection 40 mg (40 mg Intravenous Given 11/13/23 1529)    Followed by  pantoprazole  (PROTONIX ) injection 40 mg (has no administration in time range)  iohexol  (OMNIPAQUE ) 350 MG/ML injection 100 mL (100 mLs Intravenous Contrast Given 11/13/23 1503)                                Medical Decision Making Amount and/or Complexity of Data Reviewed Labs: ordered. Radiology: ordered.  Risk Prescription drug management. Decision regarding hospitalization.   83 y.o. female presents to the ER  for evaluation of dark stool. Differential diagnosis includes but is not limited to lower GI bleed, peptic ulcer disease, gastritis, malignancy. Vital signs mildly elevated blood pressure, otherwise unremarkable. Physical exam as noted above.   Patient does have melanotic stool on digital rectal exam.  Will order CT angio for GI bleed.  I have ordered a fecal occult and labs as well.  I independently reviewed and interpreted the patient's labs.  CBC shows a hemoglobin 11.5 with hematocrit of 35.2.  Appears patient's last baseline was at 12.7.  CMP shows glucose of 103 otherwise no electrolyte or LFT abnormality.  Occult is positive.  I consulted [facility] given the [facility].  Spoke with Dr. Elicia who recommends admission.  Recommends CT angio GI bleed and will start the patient on some Protonix .  CT Angio  1. No acute inflammatory process identified within the abdomen or  pelvis. No active extravasation of contrast to suggest active GI  bleeding. No bowel obstruction.  2. Multiple other nonacute observations, as  described above.   Dr. Joette to admit.   I discussed results with patient at bedside discussed need for admission to monitor hemoglobin and for investigation of the hematochezia/melena.  Patient is amenable to admission.  Admit to [facility].  Portions of this report may have been transcribed using voice recognition software. Every effort was made to ensure accuracy; however, inadvertent computerized transcription errors may be present.    Final diagnoses:  Melena    ED Discharge Orders     None          Bernis Ernst, [place] 11/13/23 1657    Armenta Canning, MD 11/18/23 [phone]

## 2023-11-14 ENCOUNTER — Other Ambulatory Visit: Payer: Self-pay | Admitting: Cardiology

## 2023-11-14 DIAGNOSIS — K922 Gastrointestinal hemorrhage, unspecified: Secondary | ICD-10-CM | POA: Diagnosis not present

## 2023-11-14 LAB — CBC
HCT: 33.4 % — ABNORMAL LOW (ref 36.0–46.0)
HCT: 34.5 % — ABNORMAL LOW (ref 36.0–46.0)
Hemoglobin: 10.9 g/dL — ABNORMAL LOW (ref 12.0–15.0)
Hemoglobin: 11 g/dL — ABNORMAL LOW (ref 12.0–15.0)
MCH: 28.6 pg (ref 26.0–34.0)
MCH: 29.2 pg (ref 26.0–34.0)
MCHC: 31.9 g/dL (ref 30.0–36.0)
MCHC: 32.6 g/dL (ref 30.0–36.0)
MCV: 89.5 fL (ref 80.0–100.0)
MCV: 89.6 fL (ref 80.0–100.0)
Platelets: 179 K/uL (ref 150–400)
Platelets: 192 K/uL (ref 150–400)
RBC: 3.73 MIL/uL — ABNORMAL LOW (ref 3.87–5.11)
RBC: 3.85 MIL/uL — ABNORMAL LOW (ref 3.87–5.11)
RDW: 13.3 % (ref 11.5–15.5)
RDW: 13.6 % (ref 11.5–15.5)
WBC: 6.8 K/uL (ref 4.0–10.5)
WBC: 8.1 K/uL (ref 4.0–10.5)
nRBC: 0 % (ref 0.0–0.2)
nRBC: 0 % (ref 0.0–0.2)

## 2023-11-14 LAB — BASIC METABOLIC PANEL WITH GFR
Anion gap: 8 (ref 5–15)
BUN: 13 mg/dL (ref 8–23)
CO2: 21 mmol/L — ABNORMAL LOW (ref 22–32)
Calcium: 8.6 mg/dL — ABNORMAL LOW (ref 8.9–10.3)
Chloride: 104 mmol/L (ref 98–111)
Creatinine, Ser: 0.66 mg/dL (ref 0.44–1.00)
GFR, Estimated: >60 mL/min (ref >60)
Glucose, Bld: 96 mg/dL (ref 70–99)
Potassium: 3.7 mmol/L (ref 3.5–5.1)
Sodium: 133 mmol/L — ABNORMAL LOW (ref 135–145)

## 2023-11-14 MED ORDER — PANTOPRAZOLE SODIUM 40 MG PO TBEC: 40.0000 mg | DELAYED_RELEASE_TABLET | Freq: Every day | ORAL | 0 refills | Status: DC

## 2023-11-14 NOTE — Discharge Summary (Signed)
 Physician Discharge Summary   Patient: Beth Simon MRN: [id number] DOB: [birth year]  Admit date:     11/13/2023  Discharge date: 11/14/23  Discharge Physician: Toribio Door   PCP: Dwight Trula SQUIBB, MD   Recommendations at discharge:   Probable GI bleed.  See discussion below.  Follow-up with PCP in 1 week for CBC.  Follow-up with GI in 2 weeks.  Discharge Diagnoses: Principal Problem:   GI bleed Active Problems:   CAD (coronary artery disease)   Essential hypertension   Chronic combined systolic and diastolic CHF (congestive heart failure) (HCC)   Endometrial cancer (HCC)   History of breast cancer   Hyperlipidemia   Gastro-esophageal reflux disease without esophagitis  Resolved Problems:   * No resolved hospital problems. *  Hospital Course: 83 year old woman presented with history of black stool.  Admitted for possible GI bleed.  Consultants GI  Procedures/Events None    * GI bleed Hematochezia Acute blood loss anemia ruled out Episode of bleeding prior to admission, possibly diverticular.   CTA 8 no GI bleed identified  Plavix  stopped.  Patient wishes to continue aspirin .   No further bleeding and hemoglobin stable.  Seen by gastroenterology.  Patient elected not to pursue any endoscopic evaluation at this time.  Will continue to hold Plavix , follow-up with PCP for hemoglobin check and follow-up with Dr. Kr[facility] GI in 2 weeks.   83 year old presenting to ED with 2 day history of recurrent painless blood in stool then dark/black stool   CAD (coronary artery disease) PMH NSTEMI in 09/2022 Cardiologist 1 to take her off Plavix  but she wanted to remain on it.  At this point we will stop Plavix  which she is agreeable to.  Continue aspirin .  Stable.  Essential hypertension Stable.  Chronic combined systolic and diastolic CHF (congestive heart failure) (HCC) Stable.  Endometrial cancer (HCC) Endometrioid uterine cancer, FIGO grade II.   No evidence of  recurrence.  Status post robotic hysterectomy with BSO.   Status post vaginal brachytherapy.  History of breast cancer S/p bilateral mastectomy and chemo   Hyperlipidemia Continue lipitor 40mg  daily   Gastro-esophageal reflux disease without esophagitis On IV PPI   Disposition: Home Diet recommendation:  Discharge Diet Orders (From admission, onward)     Start     Ordered   11/14/23 0000  Diet general        11/14/23 1258           Regular diet DISCHARGE MEDICATION: Allergies as of 11/14/2023       Reactions   Celecoxib Hypertension   Kenalog  [triamcinolone ]    Leg weakness/burning  injection   Prednisone Other (See Comments)   Unsteady gait per MD instructions NO STEROIDS   Procaine Palpitations   Heart palpitations        Medication List     STOP taking these medications    famotidine 40 MG tablet Commonly known as: PEPCID       TAKE these medications    Aspirin  Low Dose 81 MG tablet Generic drug: aspirin  EC TAKE 1 TABLET (81 MG TOTAL) BY MOUTH DAILY. SWALLOW WHOLE.   atorvastatin  40 MG tablet Commonly known as: LIPITOR TAKE 1 TABLET BY MOUTH EVERY DAY   carvedilol  12.5 MG tablet Commonly known as: COREG  TAKE 1 TABLET BY MOUTH 2 TIMES DAILY.   dapagliflozin  propanediol 10 MG Tabs tablet Commonly known as: Farxiga  Take 1 tablet (10 mg total) by mouth daily before breakfast.   denosumab  60 MG/ML  Soln injection Commonly known as: PROLIA  Inject 60 mg into the skin every 6 (six) months. Administer in upper arm, thigh, or abdomen   FLAX SEEDS PO Take 1 scoop by mouth every morning. Ground flaxseed in cereal.   hydrALAZINE  25 MG tablet Commonly known as: APRESOLINE  Take 1 tablet (25 mg total) by mouth as needed (for blood pressure greater than 160/100. May take up to twice daily.).   losartan  50 MG tablet Commonly known as: COZAAR  Take 1 tablet (50 mg total) by mouth daily.   NONFORMULARY OR COMPOUNDED ITEM Prosthetic bra.  Please  supply 2 bras.  Refill:  2.   OVER THE COUNTER MEDICATION Take 1 tablet by mouth every morning. Astazanthin. (anti-oxidant)   pantoprazole  40 MG tablet Commonly known as: Protonix  Take 1 tablet (40 mg total) by mouth daily.   PreserVision AREDS Caps Take 1 capsule by mouth daily.   VITAMIN B 12 PO Take 1 tablet by mouth every morning.   VITAMIN D (CHOLECALCIFEROL) PO Take 4,000 Int'l Units by mouth every morning.        Follow-up Information     Kriss Estefana DEL, DO. Schedule an appointment as soon as possible for a visit in 2 week(s).   Specialty: Gastroenterology Contact information: [address] [place] [address] [phone]         Raju, Sneha P, MD. Schedule an appointment as soon as possible for a visit in 1 week(s).   Specialty: Internal Medicine Why: To check blood count Contact information: [address] [place] [address] [phone]                Feels fine, no bleeding  Discharge Exam: There were no vitals filed for this visit. Physical Exam Vitals reviewed.  Constitutional:      General: She is not in acute distress.    Appearance: She is not ill-appearing or toxic-appearing.  Cardiovascular:     Rate and Rhythm: Normal rate and regular rhythm.     Heart sounds: No murmur heard. Pulmonary:     Effort: Pulmonary effort is normal. No respiratory distress.     Breath sounds: No wheezing, rhonchi or rales.  Neurological:     Mental Status: She is alert.  Psychiatric:        Mood and Affect: Mood normal.        Behavior: Behavior normal.      Condition at discharge: good  The results of significant diagnostics from this hospitalization (including imaging, microbiology, ancillary and laboratory) are listed below for reference.   Imaging Studies: CT ANGIO GI BLEED Result Date: 11/13/2023 CLINICAL DATA:  LUQ abdominal pain. Dark and bright red blood per rectum with melena. History of  endometrial carcinoma. * Tracking Code: BO * EXAM: CTA ABDOMEN AND PELVIS WITHOUT AND WITH CONTRAST TECHNIQUE: Multidetector CT imaging of the abdomen and pelvis was performed using the standard protocol during bolus administration of intravenous contrast. Multiplanar reconstructed images and MIPs were obtained and reviewed to evaluate the vascular anatomy. RADIATION DOSE REDUCTION: This exam was performed according to the departmental dose-optimization program which includes automated exposure control, adjustment of the mA and/or kV according to patient size and/or use of iterative reconstruction technique. CONTRAST:  OMNIPAQUE  IOHEXOL  350 MG/ML SOLN COMPARISON:  Nuclear medicine PET scan from 05/09/2014. FINDINGS: VASCULAR Aorta: Normal caliber aorta without aneurysm, dissection, vasculitis or significant stenosis. Mild-to-moderate peripheral atherosclerotic vascular calcifications noted throughout the abdominal aorta and its major branches. Celiac:  Patent without evidence of aneurysm, dissection, vasculitis or significant stenosis. SMA: Patent without evidence of aneurysm, dissection, vasculitis or significant stenosis. Renals: Both renal arteries are patent without evidence of aneurysm, dissection, vasculitis, fibromuscular dysplasia or significant stenosis. IMA: Patent without evidence of aneurysm, dissection, vasculitis or significant stenosis. Inflow: Patent without evidence of aneurysm, dissection, vasculitis or significant stenosis. Proximal Outflow: Bilateral common femoral and visualized portions of the superficial and profunda femoral arteries are patent without evidence of aneurysm, dissection, vasculitis or significant stenosis. Veins: No obvious venous abnormality within the limitations of this arterial phase study. Review of the MIP images confirms the above findings. NON-VASCULAR Lower chest: There are patchy atelectatic changes in the visualized lung bases. No overt consolidation. No pleural  effusion. The heart is normal in size. No pericardial effusion. Hepatobiliary: The liver is normal in size. Non-cirrhotic configuration. No suspicious mass. No intrahepatic or extrahepatic bile duct dilation. No calcified gallstones. Normal gallbladder wall thickness. No pericholecystic inflammatory changes. Pancreas: Unremarkable. No pancreatic ductal dilatation or surrounding inflammatory changes. Spleen: Within normal limits. No focal lesion. Adrenals/Urinary Tract: Adrenal glands are unremarkable. No suspicious renal mass. No hydronephrosis. No renal or ureteric calculi. Unremarkable urinary bladder. Stomach/Bowel: No disproportionate dilation of the small or large bowel loops. No evidence of abnormal bowel wall thickening or inflammatory changes. The appendix is unremarkable. There are multiple diverticula throughout the colon, without imaging signs of diverticulitis. No active extravasation of contrast noted to suggest active GI bleeding. Vascular/Lymphatic: No ascites or pneumoperitoneum. No abdominal or pelvic lymphadenopathy, by size criteria. Reproductive: The uterus is surgically absent. No large adnexal mass. Other: There is a tiny fat containing umbilical hernia. The soft tissues and abdominal wall are otherwise unremarkable. Musculoskeletal: No suspicious osseous lesions. There are mild multilevel degenerative changes in the visualized spine. IMPRESSION: 1. No acute inflammatory process identified within the abdomen or pelvis. No active extravasation of contrast to suggest active GI bleeding. No bowel obstruction. 2. Multiple other nonacute observations, as described above. Aortic Atherosclerosis (ICD10-I70.0). Electronically Signed   By: Ree Molt M.D.   On: 11/13/2023 15:41    Microbiology: Results for orders placed or performed during the hospital encounter of 03/30/23  Resp panel by RT-PCR (RSV, Flu A&B, Covid) Anterior Nasal Swab     Status: None   Collection Time: 03/30/23  4:56 PM    Specimen: Anterior Nasal Swab  Result Value Ref Range Status   SARS Coronavirus 2 by RT PCR NEGATIVE NEGATIVE Final   Influenza A by PCR NEGATIVE NEGATIVE Final   Influenza B by PCR NEGATIVE NEGATIVE Final    Comment: (NOTE) The Xpert Xpress SARS-CoV-2/FLU/RSV plus assay is intended as an aid in the diagnosis of influenza from Nasopharyngeal swab specimens and should not be used as a sole basis for treatment. Nasal washings and aspirates are unacceptable for Xpert Xpress SARS-CoV-2/FLU/RSV testing.  Fact Sheet for Patients: BloggerCourse.com  Fact Sheet for Healthcare Providers: SeriousBroker.it  This test is not yet approved or cleared by the [place]  FDA and has been authorized for detection and/or diagnosis of SARS-CoV-2 by FDA under an Emergency Use Authorization (EUA). This EUA will remain in effect (meaning this test can be used) for the duration of the COVID-19 declaration under Section 564(b)(1) of the Act, 21 U.S.C. section 360bbb-3(b)(1), unless the authorization is terminated or revoked.     Resp Syncytial Virus by PCR NEGATIVE NEGATIVE Final    Comment: (NOTE) Fact Sheet for Patients: BloggerCourse.com  Fact Sheet for Healthcare Providers: SeriousBroker.it  This test is not yet approved or cleared by the [place]  FDA and has been authorized for detection and/or diagnosis of SARS-CoV-2 by FDA under an Emergency Use Authorization (EUA). This EUA will remain in effect (meaning this test can be used) for the duration of the COVID-19 declaration under Section 564(b)(1) of the Act, 21 U.S.C. section 360bbb-3(b)(1), unless the authorization is terminated or revoked.  Performed at [facility], [address] [place], [place] [address]     Labs: CBC: Recent Labs  Lab 11/13/23 1259 11/13/23 1855 11/14/23 0001 11/14/23 0611  WBC 8.5 7.7 8.1 6.8   NEUTROABS 4.9  --   --   --   HGB 11.5* 11.8* 11.0* 10.9*  HCT 35.2* 38.3 34.5* 33.4*  MCV 88.4 92.7 89.6 89.5  PLT 215 209 179 192   Basic Metabolic Panel: Recent Labs  Lab 11/13/23 1259 11/14/23 0001  NA 135 133*  K 4.3 3.7  CL 100 104  CO2 22 21*  GLUCOSE 103* 96  BUN 14 13  CREATININE 0.78 0.66  CALCIUM  9.8 8.6*   Liver Function Tests: Recent Labs  Lab 11/13/23 1259  AST 24  ALT 15  ALKPHOS 46  BILITOT 0.5  PROT 7.0  ALBUMIN 4.4   CBG: No results for input(s): GLUCAP in the last 168 hours.  Discharge time spent: greater than 30 minutes.  Signed: Toribio Door, MD [facility] 11/14/2023

## 2023-11-14 NOTE — TOC Progression Note (Signed)
 Transition of Care [facility]) - Progression Note    Patient Details  Name: Beth Simon MRN: [id number] Date of Birth: Feb 16, 1941  Transition of [facility]) CM/SW Contact  Sonda Manuella Quill, RN Phone Number: 11/14/2023, 4:58 PM  Clinical Narrative:    Pt d/c'd before TOC assessment could be completed.                    Expected Discharge Plan and Services         Expected Discharge Date: 11/14/23                                     Social Drivers of Health (SDOH) Interventions SDOH Screenings   Food Insecurity: No Food Insecurity (11/14/2023)  Housing: Low Risk  (11/14/2023)  Transportation Needs: Unknown (11/14/2023)  Utilities: Not At Risk (11/14/2023)  Depression (PHQ2-9): Low Risk  (02/25/2023)  Tobacco Use: Low Risk  (11/13/2023)    Readmission Risk Interventions     No data to display

## 2023-11-14 NOTE — Consult Note (Signed)
 [facility] Consult  Referring Provider: No ref. provider found Primary Care Physician:  Dwight Trula SQUIBB, MD Primary Gastroenterologist: [facility]  Reason for Consultation: Melena, rectal bleeding  SUBJECTIVE:   HPI: Beth Simon is a 83 y.o. female with past medical history  significant for coronary artery disease on Plavix , breast cancer.  Presented to hospital at recommendation from PCP for rectal bleeding.  She noted that symptoms began on 11/12/2023 when she began to have fecal urgency, bright red blood per rectum which became purple in color, subsequently became black/tarry in color.  Denied abdominal pain.  No nausea or vomiting.  No chest pain or shortness of breath.  Colonoscopy 02/18/2019 (Dr. Celestia) showed 5 mm descending colon tubular adenoma, AVM in the sigmoid and cecum, diverticulosis in the transverse, sigmoid and descending colon.  CTA GI bleed negative. Hemoglobin 11.5 (baseline 13.5).  Past Medical History:  Diagnosis Date   Adenomatous polyps 01/2016   Anemia    Breast cancer (HCC) 1990   bilateral mastectomy, adenoca breast-left MRM, reconstruction, chemo   Bronchitis last 2 weeks   saw dr roseann 02-28-2014, he said no antibiotics needed, nonproductive cough   Complication of anesthesia    Cystitis    cytoxen, had once or twice   Family history of breast cancer    History of breast cancer    History of radiation therapy 2/10, 2/12, 2/18, 2/24, 05/29/14   vaginal cuff/ 30 Gy/ 5 fx   Hypertension    Macular degeneration    Neck pain    taking physictal therapy last 2 weeks   Osteoporosis    On Prolia .   PONV (postoperative nausea and vomiting)    Shoulder pain    taking physical therapy for last few weeks   Uterine cancer (HCC) 03/21/2014   MLH1/PMS2 LOH   Uterine fibroid    VAIN I (vaginal intraepithelial neoplasia grade I) 2018   positive HR HPV.   Vasovagal syncopes    Past Surgical History:  Procedure Laterality Date    BREAST IMPLANT REMOVAL  6/94   left breast   BREAST RECONSTRUCTION  1990   left   CATARACT EXTRACTION Bilateral    EXCISION VAGINAL CYST     MASTECTOMY Right 10/08/06   prophylactic   RADICAL MASTECTOMY LND  1990   left, chemo done   RIGHT/LEFT HEART CATH AND CORONARY ANGIOGRAPHY N/A 10/17/2022   Procedure: RIGHT/LEFT HEART CATH AND CORONARY ANGIOGRAPHY;  Surgeon: Anner Alm ORN, MD;  Location: [facility] INVASIVE CV LAB;  Service: Cardiovascular;  Laterality: N/A;   ROBOTIC ASSISTED TOTAL HYSTERECTOMY WITH BILATERAL SALPINGO OOPHERECTOMY Bilateral 03/21/2014   Procedure: ROBOTIC ASSISTED TOTAL HYSTERECTOMY WITH BILATERAL SALPINGO OOPHORECTOMY ;  Surgeon: Sari Bachelor, MD;  Location: [facility];  Service: Gynecology;  Laterality: Bilateral;   Prior to Admission medications   Medication Sig Start Date End Date Taking? Authorizing Provider  aspirin  EC (ASPIRIN  LOW DOSE) 81 MG tablet TAKE 1 TABLET (81 MG TOTAL) BY MOUTH DAILY. SWALLOW WHOLE. 11/28/22  Yes [place], Peter M, MD  atorvastatin  (LIPITOR) 40 MG tablet TAKE 1 TABLET BY MOUTH EVERY DAY 11/28/22  Yes [place], Peter M, MD  carvedilol  (COREG ) 12.5 MG tablet TAKE 1 TABLET BY MOUTH 2 TIMES DAILY. 08/27/23  Yes [place], Peter M, MD  Cyanocobalamin (VITAMIN B 12 PO) Take 1 tablet by mouth every morning.    Yes [provider]  dapagliflozin  propanediol (FARXIGA ) 10 MG TABS tablet Take 1 tablet (10 mg total) by mouth daily before  breakfast. 12/12/22  Yes Goodrich, Callie E, PA-C  denosumab  (PROLIA ) 60 MG/ML SOLN injection Inject 60 mg into the skin every 6 (six) months. Administer in upper arm, thigh, or abdomen   Yes [provider]  famotidine (PEPCID) 40 MG tablet Take 40 mg by mouth daily. 01/04/23  Yes [provider]  Flaxseed, Linseed, (FLAX SEEDS PO) Take 1 scoop by mouth every morning. Ground flaxseed in cereal.   Yes [provider]  hydrALAZINE  (APRESOLINE ) 25 MG tablet Take 1 tablet (25 mg total) by mouth as  needed (for blood pressure greater than 160/100. May take up to twice daily.). 08/05/23  Yes [place], Peter M, MD  losartan  (COZAAR ) 50 MG tablet Take 1 tablet (50 mg total) by mouth daily. 08/07/23  Yes [place], Peter M, MD  Multiple Vitamins-Minerals (PRESERVISION AREDS) CAPS Take 1 capsule by mouth daily.   Yes [provider]  OVER THE COUNTER MEDICATION Take 1 tablet by mouth every morning. Astazanthin. (anti-oxidant)   Yes [provider]  pantoprazole  (PROTONIX ) 40 MG tablet Take 1 tablet (40 mg total) by mouth daily. 11/14/23 11/13/24 Yes Jadine Toribio SQUIBB, MD  VITAMIN D, CHOLECALCIFEROL, PO Take 4,000 Int'l Units by mouth every morning.    Yes [provider]  NONFORMULARY OR COMPOUNDED ITEM Prosthetic bra.  Please supply 2 bras.  Refill:  2. 02/16/23   Cathlyn JAYSON Nikki Bobie FORBES, MD   Current Facility-Administered Medications  Medication Dose Route Frequency Provider Last Rate Last Admin   acetaminophen  (TYLENOL ) tablet 650 mg  650 mg Oral Q6H PRN Waddell Rake, MD       Or   acetaminophen  (TYLENOL ) suppository 650 mg  650 mg Rectal Q6H PRN Waddell Rake, MD       atorvastatin  (LIPITOR) tablet 40 mg  40 mg Oral Daily Waddell Rake, MD   40 mg at 11/14/23 1328   carvedilol  (COREG ) tablet 12.5 mg  12.5 mg Oral BID Waddell Rake, MD   12.5 mg at 11/14/23 1327   dapagliflozin  propanediol (FARXIGA ) tablet 10 mg  10 mg Oral QAC breakfast Waddell Rake, MD       hydrALAZINE  (APRESOLINE ) tablet 25 mg  25 mg Oral BID PRN Krishnan, Gokul, MD   25 mg at 11/13/23 2317   losartan  (COZAAR ) tablet 50 mg  50 mg Oral Daily Waddell Rake, MD   50 mg at 11/14/23 1328   ondansetron  (ZOFRAN ) tablet 4 mg  4 mg Oral Q6H PRN Waddell Rake, MD       Or   ondansetron  (ZOFRAN ) injection 4 mg  4 mg Intravenous Q6H PRN Waddell Rake, MD       pantoprazole  (PROTONIX ) injection 40 mg  40 mg Intravenous Q12H Bernis Ernst, PA-C   40 mg at 11/14/23 1012   Allergies as of 11/13/2023 -  Review Complete 11/13/2023  Allergen Reaction Noted   Celecoxib Hypertension 05/01/2021   Kenalog  [triamcinolone ]  10/10/2022   Prednisone Other (See Comments) 06/03/2017   Procaine Palpitations 03/01/2014   Family History  Problem Relation Age of Onset   Breast cancer Sister 17       recurrence age 82   Breast cancer Paternal Grandmother    Heart disease Mother    Heart disease Father    Hypertension Sister    Dementia Sister    Breast cancer Maternal Aunt 61   Dementia Maternal Aunt        dx in her 46s   Social History   Socioeconomic History  Marital status: Widowed    Spouse name: Not on file   Number of children: 1   Years of education: Not on file   Highest education level: Not on file  Occupational History   Not on file  Tobacco Use   Smoking status: Never    Passive exposure: Never   Smokeless tobacco: Never  Vaping Use   Vaping status: Never Used  Substance and Sexual Activity   Alcohol use: No   Drug use: No   Sexual activity: Not Currently    Partners: Male    Comment: 1st intercourse 8 yo-2 partners  Other Topics Concern   Not on file  Social History Narrative   Not on file   Social Drivers of Health   Financial Resource Strain: Not on file  Food Insecurity: No Food Insecurity (11/14/2023)   Hunger Vital Sign    Worried About Running Out of Food in the Last Year: Never true    Ran Out of Food in the Last Year: Never true  Transportation Needs: Unknown (11/14/2023)   PRAPARE - Administrator, Civil Service (Medical): Not on file    Lack of Transportation (Non-Medical): No  Physical Activity: Not on file  Stress: Not on file  Social Connections: Not on file  Intimate Partner Violence: Not At Risk (11/14/2023)   Humiliation, Afraid, Rape, and Kick questionnaire    Fear of Current or Ex-Partner: No    Emotionally Abused: No    Physically Abused: No    Sexually Abused: No   Review of Systems:  Review of Systems  Respiratory:   Negative for shortness of breath.   Cardiovascular:  Negative for chest pain.  Gastrointestinal:  Positive for blood in stool. Negative for abdominal pain, nausea and vomiting.    OBJECTIVE:   Temp:  [97.5 F (36.4 C)-97.7 F (36.5 C)] 97.5 F (36.4 C) (08/16 0557) Pulse Rate:  [57-66] 57 (08/16 0557) Resp:  [14-17] 16 (08/16 0557) BP: (128-202)/(62-88) 138/64 (08/16 0557) SpO2:  [95 %-100 %] 95 % (08/16 0557) Last BM Date : 11/13/23 Physical Exam Constitutional:      General: She is not in acute distress.    Appearance: She is not ill-appearing, toxic-appearing or diaphoretic.  Cardiovascular:     Rate and Rhythm: Normal rate and regular rhythm.  Pulmonary:     Effort: No respiratory distress.     Breath sounds: Normal breath sounds.  Abdominal:     General: Bowel sounds are normal. There is no distension.     Palpations: Abdomen is soft.     Tenderness: There is no abdominal tenderness. There is no guarding.  Neurological:     Mental Status: She is alert.     Labs: Recent Labs    11/13/23 1855 11/14/23 0001 11/14/23 0611  WBC 7.7 8.1 6.8  HGB 11.8* 11.0* 10.9*  HCT 38.3 34.5* 33.4*  PLT 209 179 192   BMET Recent Labs    11/13/23 1259 11/14/23 0001  NA 135 133*  K 4.3 3.7  CL 100 104  CO2 22 21*  GLUCOSE 103* 96  BUN 14 13  CREATININE 0.78 0.66  CALCIUM  9.8 8.6*   LFT Recent Labs    11/13/23 1259  PROT 7.0  ALBUMIN 4.4  AST 24  ALT 15  ALKPHOS 46  BILITOT 0.5   PT/INR Recent Labs    11/13/23 1855  LABPROT 13.8  INR 1.0    Diagnostic imaging: CT ANGIO GI BLEED Result Date:  11/13/2023 CLINICAL DATA:  LUQ abdominal pain. Dark and bright red blood per rectum with melena. History of endometrial carcinoma. * Tracking Code: BO * EXAM: CTA ABDOMEN AND PELVIS WITHOUT AND WITH CONTRAST TECHNIQUE: Multidetector CT imaging of the abdomen and pelvis was performed using the standard protocol during bolus administration of intravenous contrast.  Multiplanar reconstructed images and MIPs were obtained and reviewed to evaluate the vascular anatomy. RADIATION DOSE REDUCTION: This exam was performed according to the departmental dose-optimization program which includes automated exposure control, adjustment of the mA and/or kV according to patient size and/or use of iterative reconstruction technique. CONTRAST:  OMNIPAQUE  IOHEXOL  350 MG/ML SOLN COMPARISON:  Nuclear medicine PET scan from 05/09/2014. FINDINGS: VASCULAR Aorta: Normal caliber aorta without aneurysm, dissection, vasculitis or significant stenosis. Mild-to-moderate peripheral atherosclerotic vascular calcifications noted throughout the abdominal aorta and its major branches. Celiac: Patent without evidence of aneurysm, dissection, vasculitis or significant stenosis. SMA: Patent without evidence of aneurysm, dissection, vasculitis or significant stenosis. Renals: Both renal arteries are patent without evidence of aneurysm, dissection, vasculitis, fibromuscular dysplasia or significant stenosis. IMA: Patent without evidence of aneurysm, dissection, vasculitis or significant stenosis. Inflow: Patent without evidence of aneurysm, dissection, vasculitis or significant stenosis. Proximal Outflow: Bilateral common femoral and visualized portions of the superficial and profunda femoral arteries are patent without evidence of aneurysm, dissection, vasculitis or significant stenosis. Veins: No obvious venous abnormality within the limitations of this arterial phase study. Review of the MIP images confirms the above findings. NON-VASCULAR Lower chest: There are patchy atelectatic changes in the visualized lung bases. No overt consolidation. No pleural effusion. The heart is normal in size. No pericardial effusion. Hepatobiliary: The liver is normal in size. Non-cirrhotic configuration. No suspicious mass. No intrahepatic or extrahepatic bile duct dilation. No calcified gallstones. Normal gallbladder wall  thickness. No pericholecystic inflammatory changes. Pancreas: Unremarkable. No pancreatic ductal dilatation or surrounding inflammatory changes. Spleen: Within normal limits. No focal lesion. Adrenals/Urinary Tract: Adrenal glands are unremarkable. No suspicious renal mass. No hydronephrosis. No renal or ureteric calculi. Unremarkable urinary bladder. Stomach/Bowel: No disproportionate dilation of the small or large bowel loops. No evidence of abnormal bowel wall thickening or inflammatory changes. The appendix is unremarkable. There are multiple diverticula throughout the colon, without imaging signs of diverticulitis. No active extravasation of contrast noted to suggest active GI bleeding. Vascular/Lymphatic: No ascites or pneumoperitoneum. No abdominal or pelvic lymphadenopathy, by size criteria. Reproductive: The uterus is surgically absent. No large adnexal mass. Other: There is a tiny fat containing umbilical hernia. The soft tissues and abdominal wall are otherwise unremarkable. Musculoskeletal: No suspicious osseous lesions. There are mild multilevel degenerative changes in the visualized spine. IMPRESSION: 1. No acute inflammatory process identified within the abdomen or pelvis. No active extravasation of contrast to suggest active GI bleeding. No bowel obstruction. 2. Multiple other nonacute observations, as described above. Aortic Atherosclerosis (ICD10-I70.0). Electronically Signed   By: Ree Molt M.D.   On: 11/13/2023 15:41   IMPRESSION: Hematochezia, question melena Acute blood loss anemia Coronary artery disease on clopidogrel  Diverticular disease Personal history of colon polyp History of AVM on colonoscopy 2020  PLAN: -Had at length discussion with patient at bedside today, she is no longer seeing blood per rectum, last bowel movement was yesterday at noon, she is not interested in pursuing endoscopic evaluation at this time -She has plans to discontinue Plavix  -She is agreeable to  following up with her PCP this week for repeat hemoglobin check -She is agreeable to return to hospital  if she has recurrence of bleeding, if this occurs we will repeat conversation regarding endoscopic evaluation -Discussed with internal medicine, okay for discharge from GI perspective, recommend she follow-up with me in the office within the next 2 weeks   LOS: 0 days   Estefana Keas, [facility]

## 2023-11-14 NOTE — Plan of Care (Signed)
  Problem: Education: Goal: Knowledge of General Education information will improve Description: Including pain rating scale, medication(s)/side effects and non-pharmacologic comfort measures Outcome: Adequate for Discharge   Problem: Health Behavior/Discharge Planning: Goal: Ability to manage health-related needs will improve Outcome: Adequate for Discharge   Problem: Clinical Measurements: Goal: Ability to maintain clinical measurements within normal limits will improve Outcome: Adequate for Discharge Goal: Will remain free from infection Outcome: Adequate for Discharge Goal: Diagnostic test results will improve 11/14/2023 1337 by Horacio Suzen BIRCH, RN Outcome: Adequate for Discharge 11/14/2023 0909 by Horacio Suzen BIRCH, RN Outcome: Progressing Goal: Respiratory complications will improve Outcome: Adequate for Discharge Goal: Cardiovascular complication will be avoided Outcome: Adequate for Discharge   Problem: Activity: Goal: Risk for activity intolerance will decrease Outcome: Adequate for Discharge   Problem: Nutrition: Goal: Adequate nutrition will be maintained Outcome: Adequate for Discharge   Problem: Coping: Goal: Level of anxiety will decrease Outcome: Adequate for Discharge   Problem: Elimination: Goal: Will not experience complications related to bowel motility Outcome: Adequate for Discharge Goal: Will not experience complications related to urinary retention Outcome: Adequate for Discharge   Problem: Pain Managment: Goal: General experience of comfort will improve and/or be controlled Outcome: Adequate for Discharge   Problem: Safety: Goal: Ability to remain free from injury will improve Outcome: Adequate for Discharge   Problem: Skin Integrity: Goal: Risk for impaired skin integrity will decrease Outcome: Adequate for Discharge

## 2023-11-14 NOTE — Hospital Course (Addendum)
 83 year old woman presented with history of black stool.  Admitted for possible GI bleed.  Consultants GI  Procedures/Events None

## 2023-11-14 NOTE — Progress Notes (Signed)
 Patient given discharge, medication, and follow up instructions, verbalized understanding, IV and telemetry monitor removed, personal belongings with patient, friend to transport home

## 2023-11-14 NOTE — Plan of Care (Signed)
   Problem: Clinical Measurements: Goal: Diagnostic test results will improve Outcome: Progressing

## 2023-11-20 ENCOUNTER — Other Ambulatory Visit: Payer: Self-pay | Admitting: Cardiology

## 2023-11-22 ENCOUNTER — Other Ambulatory Visit: Payer: Self-pay | Admitting: Cardiology

## 2023-11-23 DIAGNOSIS — Z8719 Personal history of other diseases of the digestive system: Secondary | ICD-10-CM | POA: Diagnosis not present

## 2023-11-23 DIAGNOSIS — I10 Essential (primary) hypertension: Secondary | ICD-10-CM | POA: Diagnosis not present

## 2023-11-23 DIAGNOSIS — K5790 Diverticulosis of intestine, part unspecified, without perforation or abscess without bleeding: Secondary | ICD-10-CM | POA: Diagnosis not present

## 2023-11-23 DIAGNOSIS — I2510 Atherosclerotic heart disease of native coronary artery without angina pectoris: Secondary | ICD-10-CM | POA: Diagnosis not present

## 2023-11-23 DIAGNOSIS — R5381 Other malaise: Secondary | ICD-10-CM | POA: Diagnosis not present

## 2023-12-04 DIAGNOSIS — K922 Gastrointestinal hemorrhage, unspecified: Secondary | ICD-10-CM | POA: Diagnosis not present

## 2023-12-04 DIAGNOSIS — K5790 Diverticulosis of intestine, part unspecified, without perforation or abscess without bleeding: Secondary | ICD-10-CM | POA: Diagnosis not present

## 2023-12-17 DIAGNOSIS — H35373 Puckering of macula, bilateral: Secondary | ICD-10-CM | POA: Diagnosis not present

## 2023-12-17 DIAGNOSIS — Z961 Presence of intraocular lens: Secondary | ICD-10-CM | POA: Diagnosis not present

## 2023-12-17 DIAGNOSIS — H5212 Myopia, left eye: Secondary | ICD-10-CM | POA: Diagnosis not present

## 2023-12-22 ENCOUNTER — Ambulatory Visit (INDEPENDENT_AMBULATORY_CARE_PROVIDER_SITE_OTHER): Admitting: Podiatry

## 2023-12-22 VITALS — Ht <= 58 in | Wt 119.8 lb

## 2023-12-22 DIAGNOSIS — M79676 Pain in unspecified toe(s): Secondary | ICD-10-CM

## 2023-12-22 DIAGNOSIS — B351 Tinea unguium: Secondary | ICD-10-CM | POA: Diagnosis not present

## 2023-12-22 NOTE — Progress Notes (Signed)
 Subjective: Chief Complaint  Patient presents with   Nail Problem    Rm 11 RFC/ Nail trim.    83 y.o. returns the office today for painful, elongated, thickened toenails which she is unable to trim herself.  States the nails get painful to get elongated, but they have not grown as much.  She also reports that she started getting ingrown toenail on the right second toe medial aspect as a gets tender.  No drainage or pus.   Objective: AAO 3, NAD DP/PT pulses palpable, CRT less than 3 seconds Nails hypertrophic, dystrophic, elongated, brittle, discolored 10.  Incurvation of the nails mostly on the right without signs of infection today.  There is no drainage or pus today.  There is tenderness overlying the nails 1-5 bilaterally.  No signs of infection.  Ingrown toenails present to multiple toenails without any signs of infection with the right medial second toenail but no symptomatic from an ingrown toenail standpoint.  There is no drainage or pus or signs of infection. Hammertoes present No pain with calf compression, warmth, erythema.  Assessment: Symptomatic onychomycosis, ingrown toenails; hyperkeratotic lesion left foot due to hammertoe deformity  Plan: -Treatment options including alternatives, risks, complications were discussed -Nails sharply debrided 10 without any complications or bleeding.  Monitor ingrown toenails.  Discussed that if the ingrown toenail becomes more symptomatic need to proceed with partial nail avulsion. -Hyperkeratotic lesion sharply debrided 1 without complication/bleeding -she is going to use the lateral wedge that was made previously for her with her over-the-counter insert.  RTC 6 weeks or sooner if needed (discussed routine care may not be covered that early)   Beth Simon DPM

## 2023-12-23 ENCOUNTER — Other Ambulatory Visit: Payer: Self-pay

## 2023-12-23 MED ORDER — DAPAGLIFLOZIN PROPANEDIOL 10 MG PO TABS: 10.0000 mg | ORAL_TABLET | Freq: Every day | ORAL | 3 refills | Status: AC

## 2023-12-30 DIAGNOSIS — Z23 Encounter for immunization: Secondary | ICD-10-CM | POA: Diagnosis not present

## 2023-12-30 DIAGNOSIS — D649 Anemia, unspecified: Secondary | ICD-10-CM | POA: Diagnosis not present

## 2024-01-16 NOTE — Progress Notes (Unsigned)
 Cardiology Office Note:    Date:  01/20/2024   ID:  Beth, Sukup 1940/06/09, MRN [id number]  PCP:  Dwight Trula SQUIBB, MD  Cardiologist:  Taylie Helder [place], MD     Referring MD: Dwight Trula SQUIBB, MD   Chief Complaint: seen for follow up CAD.   History of Present Illness:    Beth Simon is a 83 y.o. female with a history of CAD with NSTEMI on 09/2022 (cath showed diffuse disease but anatomy not favorable for PCI so treated medically), chronic combined CHF with EF of 30-35% on Echo in 09/2022, hypertension, hyperlipidemia, anemia, breast cancer s/p chemo and mastectomy, and uterine cancer s/p radiation and hysterectomy.   Patient was first seen by Cardiology during an admission in 09/2022 for NSTEMI after presenting with palpitations, uncontrolled BP, and mild chest discomfort. High-sensitivity troponin  53 >> 2,054. Echo showed LVEF of 30-35% with akinesis of the distal 2/3 of the LV with apical ballooning (suggestive of possible stress-induced cardiomyopathy vs LAD ischemia) as well as grade 2 diastolic dysfunction and mild MR. R/LHC showed showed 45% stenosis of proximal to mid LAD at major 1st Diag followed by 3 tandem 70%, 70%, and 85% stenoses of mid LAD with diffuse moderate diease throughout as well as 60% stenosis of the 1st Diag and 90% stenosis of LPDA. Right and left heart pressures were completely normal but cardiac output and index were mildly reduced at 3.86 and 2.51 respectively. Anatomy was not felt to be favorable for PCI; therefore, medical therapy recommended. Differential felt to be NSTEMI due to demand ischemia vs small aborted type 1 myocardial infarction vs Takotsubo syndrome. However, she was treated with IV Heparin  for 72 hours and started on DAPT.   She was seen in the ED twice in 10/2022 for elevated BP which was felt to be due to anxiety. She was noted to have very labile BP at initial follow-up visit so was started on PRN Hydralazine  for BP >160/100.   She was last seen  by Barnie Hila, NP, in 11/2022 for follow-up of hypertension at which time home BP log showed BP ranged from 90-120/50-70. Her only complaint at that time was fatigue but she denied any chest pain, shortness of breath, lightheadedness, dizziness, or syncope. Therefore, no medication changes were made.   She was seen in the ED on 01/17/2023 after a mechanical fall where she tripped on a gutter and struck her head. She denied any loss of consciousness at that time. CT showed a large frontal scalp and periorbital hematoma with laceration but no acute intracranial abnormalities, facial bone fractures, or C-spine fractures. She was felt to be stable for discharge from the ED. She called our office on 01/19/2023 and reported bruising and swelling was getting worse so she was advised to hold Plavix  for 5 days.   Back in March she called with elevated BP readings. We increased Coreg  and she notes BP very good after that until Saturday BP went up to 160. BP readings typically excellent control with range 92-130 systolic. She actually feels much better when BP is low. No chest pain or dyspnea. No swelling.   She was admitted in August after an episode of hematochezia. Hgb down to 10.9. No further bleeding noted. Plavix  discontinued. Patient deferred further evaluation. Felt to be diverticular in origin. CT negative. Coreg  dose reduced due to hypotension. Reports follow up Hgb was 10.8 and was started on an iron supplement. She denies any chest pain or SOB. Doing  pilates once a week but states she needs to walk more.     EKGs/Labs/Other Studies Reviewed:    The following studies were reviewed:  Echocardiogram 10/17/2022: Impressions: 1. There is akinesis of the distal 2/3 of the left ventricle with apical  ballooning suggestive of possible Tako-Tsubo (stress-induced)  cardiomyopathy. Left ventricular ejection fraction, by estimation, is 30  to 35%. The left ventricle has moderately  decreased function.  The left ventricle demonstrates regional wall motion  abnormalities (see scoring diagram/findings for description). Left  ventricular diastolic parameters are consistent with Grade II diastolic  dysfunction (pseudonormalization).   2. Right ventricular systolic function is normal. The right ventricular  size is normal. There is normal pulmonary artery systolic pressure.   3. Left atrial size was mild to moderately dilated.   4. The mitral valve is normal in structure. Mild mitral valve  regurgitation. No evidence of mitral stenosis.   5. The aortic valve is tricuspid. There is mild calcification of the  aortic valve. Aortic valve regurgitation is trivial. Aortic valve  sclerosis/calcification is present, without any evidence of aortic  stenosis.   6. The inferior vena cava is normal in size with greater than 50%  respiratory variability, suggesting right atrial pressure of 3 mmHg.  _______________   Right/ Left Cardiac Catheterization 10/17/2022:   Prox LAD to Mid LAD lesion is 45% stenosed at major 1st Diag followed by Mid LAD-1 lesion is 70% stenosed- just after 1st Sept (with ~ berry aneurysmal segment.  Not favorable for PCI.   Mid LAD-2 lesion is 70% stenosed.  Mid LAD-3 lesion is 85% stenosed. With difuse moderate disease throughout.   1st Diag lesion is 60% stenosed.   LPDA lesion is 90% stenosed.   LV end diastolic pressure is normal.   There is no aortic valve stenosis.   POST-CATH DIAGNOSES Severe diffuse LAD disease and focal left PDA disease of the left dominant system and separate ostia of LAD and dominant LCx. LAD has multiple sequential lesions ranging from 40% to 85-90% with 1 area involving branch points of major diagonal branch and septal perforator not very favorable for PCI especially given extent of calcification. Focal PDA 90% stenosis just prior to a very tortuous area.  (PCI amenable, but question of usefulness with the extent of disease in LAD). Well compensated  CHF with completely normal Right Heart Cath and LV Pressures, but notably reduced Cardiac Output and Index (3.86, 2.51) PAP-mean 21/11-16 million mercury, PCWP 6 mm, LV P-EDP 128/2-11 mmHg with a AO P-MAP 124/60-86 mmHg. Ao sat 94%, PA sat 64%.   RECOMMENDATIONS In the absence of any other complications or medical issues, we expect the patient to be ready for discharge from a cath perspective. Would optimize medical therapy for CAD and cardiomyopathy.  Appears to be well compensated. If she has worsening dyspnea on exertion or anginal equivalent type symptoms, could consider reevaluating PCI options, but for now would plan to treat medically.  Stabilized medically prior to discharge. Reinitiate IV heparin  2 hours after TR band removal and run for total of 48 hours. Based on likely ACS presentation and existing disease, would recommend at least 3 to 6 months of DAPT followed by completion of 1 year Plavix  monotherapy. Will DC lisinopril  with plans to potentially convert to ARB plus or minus Entresto.   Diagnostic Dominance: Right      Echo 03/05/23: IMPRESSIONS     1. Left ventricular ejection fraction, by estimation, is 60 to 65%. The  left ventricle  has normal function. The left ventricle has no regional  wall motion abnormalities. Left ventricular diastolic parameters are  consistent with Grade I diastolic  dysfunction (impaired relaxation).   2. Right ventricular systolic function is normal. The right ventricular  size is normal. Tricuspid regurgitation signal is inadequate for assessing  PA pressure.   3. Left atrial size was mild to moderately dilated.   4. The mitral valve is normal in structure. Mild mitral valve  regurgitation.   5. The aortic valve is tricuspid. There is mild calcification of the  aortic valve. Aortic valve regurgitation is mild.   6. The inferior vena cava is normal in size with greater than 50%  respiratory variability, suggesting right atrial pressure of 3  mmHg.   Conclusion(s)/Recommendation(s): Compared to prior study, EF has improved.  no longer has apical ballooning.   EKG:  EKG not ordered today.   Recent Labs: 03/31/2023: B Natriuretic Peptide 48.0 11/13/2023: ALT 15 11/14/2023: BUN 13; Creatinine, Ser 0.66; Hemoglobin 10.9; Platelets 192; Potassium 3.7; Sodium 133  Recent Lipid Panel    Component Value Date/Time   CHOL 152 07/08/2023 0816   TRIG 61 07/08/2023 0816   HDL 65 07/08/2023 0816   CHOLHDL 2.3 07/08/2023 0816   CHOLHDL 2.6 10/17/2022 1216   VLDL 16 10/17/2022 1216   LDLCALC 75 07/08/2023 0816    Physical Exam:    Vital Signs: BP 116/68   Pulse 64   Ht 4' 10.5 (1.486 m)   Wt 119 lb 12.8 oz (54.3 kg)   LMP 03/31/1988 (Approximate)   SpO2 99%   BMI 24.61 kg/m     Wt Readings from Last 3 Encounters:  01/20/24 119 lb 12.8 oz (54.3 kg)  12/22/23 119 lb 12.8 oz (54.3 kg)  10/16/23 119 lb 12.8 oz (54.3 kg)     General: 83 y.o. Caucasian female in no acute distress. HEENT: Normocephalic and atraumatic. Sclera clear.  Neck: Supple. No carotid bruits. No JVD. Heart: RRR. Distinct S1 and S2. No murmurs, gallops, or rubs.  Lungs: No increased work of breathing. Clear to ausculation bilaterally. No wheezes, rhonchi, or rales.  Abdomen: Soft, non-distended, and non-tender to palpation.  Extremities: No lower extremity edema.   Skin: Warm and dry.  Neuro: No focal deficits. Psych: Normal affect. Responds appropriately.   Assessment:    1. Coronary artery disease involving native coronary artery of native heart without angina pectoris   2. Primary hypertension   3. Chronic combined systolic and diastolic CHF (congestive heart failure) (HCC)   4. Hyperlipidemia LDL goal <70        Plan:    CAD  History of NSTEMI in 09/2022. LHC showed severe diffuse LAD disease and focal left PDA disease. Anatomy not favorable for PCI so medical therapy recommended. Differential felt to be NSTEMI due to demand ischemia vs  Takotsubo syndrome. Subsequent Echo showed normalization c/w this being more of a stress induced CM.  - she has no angina.  - Plavix  discontinued. Continue ASA indefinitely.  - Continue beta-blocker and statin.   2. Chronic Combined CHF Echo in July with  LVEF of 30-35% with akinesis of the distal 2/3 of the LV with apical ballooning (suggestive of possible stress-induced cardiomyopathy vs LAD ischemia) as well as grade 2 diastolic dysfunction and mild MR. LHC showed severe diffuse LAD disease as above. Right and left heart pressures were completely normal but cardiac output and index were mildly reduced at 3.86 and 2.51 respectively. GDMT limited by labile BP. -  repeat Echo in Dec showed normalization of LV function.  - Continue Losartan  50 mg daily.  - Continue Coreg  6.25 mg bid - Continue Farxiga  10mg  daily.   3. Hypertension She has a history of labile hypertension with anxiety felt to be playing a large role in elevated readings.  - on lower Coreg  dose now and BP is ok.  - continue current regimen - may take Hydralazine  25mg  PRN for BP >160/100 if sustained.    4. Hyperlipidemia -LDL 75. - Continue Lipitor 40mg  daily.    Disposition: follow up in 6 months    Signed, Elon Lomeli [place], MD  01/20/2024 10:16 AM    [facility]

## 2024-01-20 ENCOUNTER — Encounter: Payer: Self-pay | Admitting: Cardiology

## 2024-01-20 ENCOUNTER — Ambulatory Visit: Attending: Cardiology | Admitting: Cardiology

## 2024-01-20 VITALS — BP 116/68 | HR 64 | Ht 58.5 in | Wt 119.8 lb

## 2024-01-20 DIAGNOSIS — E785 Hyperlipidemia, unspecified: Secondary | ICD-10-CM

## 2024-01-20 DIAGNOSIS — I2510 Atherosclerotic heart disease of native coronary artery without angina pectoris: Secondary | ICD-10-CM | POA: Diagnosis not present

## 2024-01-20 DIAGNOSIS — I10 Essential (primary) hypertension: Secondary | ICD-10-CM | POA: Diagnosis not present

## 2024-01-20 DIAGNOSIS — I5042 Chronic combined systolic (congestive) and diastolic (congestive) heart failure: Secondary | ICD-10-CM

## 2024-01-20 MED ORDER — CARVEDILOL 12.5 MG PO TABS: 6.2500 mg | ORAL_TABLET | Freq: Two times a day (BID) | ORAL | 3 refills | Status: DC

## 2024-01-20 NOTE — Patient Instructions (Signed)
 Medication Instructions:  Continue same medications *If you need a refill on your cardiac medications before your next appointment, please call your pharmacy*  Lab Work: None ordered  Testing/Procedures: None ordered  Follow-Up: At [facility], you and your health needs are our priority.  As part of our continuing mission to provide you with exceptional heart care, our providers are all part of one team.  This team includes your primary Cardiologist (physician) and Advanced Practice Providers or APPs (Physician Assistants and Nurse Practitioners) who all work together to provide you with the care you need, when you need it.  Your next appointment  6 months   Call in Jan to schedule April appointment     Provider:  Dr.Jordan   We recommend signing up for the patient portal called MyChart.  Sign up information is provided on this After Visit Summary.  MyChart is used to connect with patients for Virtual Visits (Telemedicine).  Patients are able to view lab/test results, encounter notes, upcoming appointments, etc.  Non-urgent messages can be sent to your provider as well.   To learn more about what you can do with MyChart, go to [email or web address].

## 2024-02-02 ENCOUNTER — Ambulatory Visit: Admitting: Podiatry

## 2024-02-02 ENCOUNTER — Encounter: Payer: Self-pay | Admitting: Podiatry

## 2024-02-02 DIAGNOSIS — B351 Tinea unguium: Secondary | ICD-10-CM

## 2024-02-02 DIAGNOSIS — M79676 Pain in unspecified toe(s): Secondary | ICD-10-CM

## 2024-02-02 NOTE — Progress Notes (Signed)
 Subjective: Chief Complaint  Patient presents with   [facility]    Rm11 Diabetic foot care/A1c 5.7    83 y.o. returns the office today for painful, elongated, thickened toenails which she is unable to trim herself.  She states the ingrown toenail started to cause discomfort.  She does not report any swelling, redness or any drainage to the area send or with pressure.  No open lesions.  Objective: AAO 3, NAD DP/PT pulses palpable, CRT less than 3 seconds Nails hypertrophic, dystrophic, elongated, brittle, discolored 10.  Incurvation of the nails mostly on the right without signs of infection today.  There is no drainage or pus today.  There is tenderness overlying the nails 1-5 bilaterally.  No signs of infection.  Ingrown toenails present to multiple toenails without any signs of infection with the right medial and lateral second digits.  No signs of infection today. Hammertoes present No pain with calf compression, warmth, erythema.  Assessment: Symptomatic onychomycosis, ingrown toenails  Plan: -Treatment options including alternatives, risks, complications were discussed -Nails sharply debrided 10 without any complications or bleeding.  Monitor ingrown toenails.  Discussed that if the ingrown toenail becomes more symptomatic need to proceed with partial nail avulsion. -Hyperkeratotic lesion sharply debrided 1 without complication/bleeding as a courtesy as this is minimal today.  *She reports that she contacted her insurance company was told that she can get 8 routine care appointments during the year.  [name] DPM

## 2024-02-03 DIAGNOSIS — M810 Age-related osteoporosis without current pathological fracture: Secondary | ICD-10-CM | POA: Diagnosis not present

## 2024-02-03 DIAGNOSIS — E7800 Pure hypercholesterolemia, unspecified: Secondary | ICD-10-CM | POA: Diagnosis not present

## 2024-02-03 DIAGNOSIS — Z8719 Personal history of other diseases of the digestive system: Secondary | ICD-10-CM | POA: Diagnosis not present

## 2024-02-03 DIAGNOSIS — I10 Essential (primary) hypertension: Secondary | ICD-10-CM | POA: Diagnosis not present

## 2024-02-03 DIAGNOSIS — Z1331 Encounter for screening for depression: Secondary | ICD-10-CM | POA: Diagnosis not present

## 2024-02-03 DIAGNOSIS — Z8542 Personal history of malignant neoplasm of other parts of uterus: Secondary | ICD-10-CM | POA: Diagnosis not present

## 2024-02-03 DIAGNOSIS — I2510 Atherosclerotic heart disease of native coronary artery without angina pectoris: Secondary | ICD-10-CM | POA: Diagnosis not present

## 2024-02-03 DIAGNOSIS — I5042 Chronic combined systolic (congestive) and diastolic (congestive) heart failure: Secondary | ICD-10-CM | POA: Diagnosis not present

## 2024-02-03 DIAGNOSIS — Z23 Encounter for immunization: Secondary | ICD-10-CM | POA: Diagnosis not present

## 2024-02-03 DIAGNOSIS — Z853 Personal history of malignant neoplasm of breast: Secondary | ICD-10-CM | POA: Diagnosis not present

## 2024-02-03 DIAGNOSIS — Z0000 Encounter for general adult medical examination without abnormal findings: Secondary | ICD-10-CM | POA: Diagnosis not present

## 2024-02-11 DIAGNOSIS — M810 Age-related osteoporosis without current pathological fracture: Secondary | ICD-10-CM | POA: Diagnosis not present

## 2024-02-17 ENCOUNTER — Encounter: Payer: Self-pay | Admitting: Obstetrics and Gynecology

## 2024-02-17 ENCOUNTER — Ambulatory Visit (INDEPENDENT_AMBULATORY_CARE_PROVIDER_SITE_OTHER): Payer: PPO | Admitting: Obstetrics and Gynecology

## 2024-02-17 VITALS — BP 132/84 | HR 88 | Ht 59.25 in | Wt 120.0 lb

## 2024-02-17 DIAGNOSIS — Z01419 Encounter for gynecological examination (general) (routine) without abnormal findings: Secondary | ICD-10-CM | POA: Diagnosis not present

## 2024-02-17 DIAGNOSIS — N952 Postmenopausal atrophic vaginitis: Secondary | ICD-10-CM | POA: Diagnosis not present

## 2024-02-17 DIAGNOSIS — Z87411 Personal history of vaginal dysplasia: Secondary | ICD-10-CM

## 2024-02-17 DIAGNOSIS — Z853 Personal history of malignant neoplasm of breast: Secondary | ICD-10-CM | POA: Diagnosis not present

## 2024-02-17 DIAGNOSIS — M8580 Other specified disorders of bone density and structure, unspecified site: Secondary | ICD-10-CM

## 2024-02-17 DIAGNOSIS — Z8542 Personal history of malignant neoplasm of other parts of uterus: Secondary | ICD-10-CM

## 2024-02-17 NOTE — Progress Notes (Signed)
 83 y.o. [id number] Widowed Caucasian female here for a breast and pelvic exam.    The patient is also followed for urinary and fecal incontinence, breast and uterine cancer.  No unusual vaginal discharge.  No vaginal bleeding.  Has vaginal dryness.   With dietary change, and is having more consistency of stools.  She is doing some core exercises and she had better bladder control.  Uses a panty liner and it is not stained.  States she does not need to return to pelvic floor therapy.     Had rectal bleeding 11/13/23.   Plavix  was discontinued.   Anemia has now resolved.   Lost almost 20 pounds since her MI.   She declines a prescription for a breast prosthesis.   Receiving Prolia  through her PCP.   Having right wrist pain and she plans to see her doctor in the days ahead.  PCP: Dwight Trula SQUIBB, MD   Patient's last menstrual period was 03/31/1988 (approximate).           Sexually active: No.  The current method of family planning is post menopausal status.    Menopausal hormone therapy:  n/a Exercising: Yes.    Mondays- pilates, Tuesday walking 2 miles, work around the house.  Smoker:  no  OB History     Gravida  1   Para  1   Term  1   Preterm  0   AB  0   Living  1      SAB  0   IAB  0   Ectopic  0   Multiple  0   Live Births  1           HEALTH MAINTENANCE: Last 2 paps: 02/16/23 neg HR HPV neg, 09/04/21 normal, 08/28/20 normal, neg HR HPV, 08/24/19 normal, neg HR HPV,  08/20/18 normal, neg HR HPV, 06/03/17 normal, neg HR HPV, 05/16/16 normal, positive HR HPV History of abnormal Pap or positive HPV:  yes, 2018 colpo VAIN I Mammogram:  04/01/06 [facility] Cat 1 neg  Colonoscopy:  2017 polyps  Bone Density:  05/07/20  Result  osteopenia    Immunization History  Administered Date(s) Administered   Fluzone Influenza virus vaccine,trivalent (IIV3), split virus 02/18/2013   H1N1 01/18/2009   INFLUENZA, HIGH DOSE SEASONAL PF 01/08/2014, 01/19/2015, 01/24/2016    Influenza Split 01/18/2009, 12/18/2009, 02/07/2011, 01/06/2012, 02/08/2014   Influenza-Unspecified 01/18/2009, 12/18/2009, 02/07/2011, 01/06/2012, 02/18/2013, 02/08/2014, 12/30/2014   PFIZER(Purple Top)SARS-COV-2 Vaccination 04/20/2019, 05/08/2019, 01/14/2020   Pneumococcal Conjugate-13 02/28/2014   Pneumococcal Polysaccharide-23 11/06/2004   Td 12/23/2007   Tdap 08/20/2018   Zoster Recombinant(Shingrix) 09/28/2018   Zoster, Live 12/07/2008      reports that she has never smoked. She has never been exposed to tobacco smoke. She has never used smokeless tobacco. She reports that she does not drink alcohol and does not use drugs.  Past Medical History:  Diagnosis Date   Adenomatous polyps 01/2016   Anemia    Breast cancer (HCC) 1990   bilateral mastectomy, adenoca breast-left MRM, reconstruction, chemo   Bronchitis last 2 weeks   saw dr roseann 02-28-2014, he said no antibiotics needed, nonproductive cough   Complication of anesthesia    Cystitis    cytoxen, had once or twice   Family history of breast cancer    History of breast cancer    History of radiation therapy 2/10, 2/12, 2/18, 2/24, 05/29/14   vaginal cuff/ 30 Gy/ 5 fx   Hypertension    Macular  degeneration    Neck pain    taking physictal therapy last 2 weeks   Osteoporosis    On Prolia .   PONV (postoperative nausea and vomiting)    Shoulder pain    taking physical therapy for last few weeks   Uterine cancer (HCC) 03/21/2014   MLH1/PMS2 LOH   Uterine fibroid    VAIN I (vaginal intraepithelial neoplasia grade I) 2018   positive HR HPV.   Vasovagal syncopes     Past Surgical History:  Procedure Laterality Date   BREAST IMPLANT REMOVAL  6/94   left breast   BREAST RECONSTRUCTION  1990   left   CATARACT EXTRACTION Bilateral    EXCISION VAGINAL CYST     MASTECTOMY Right 10/08/06   prophylactic   RADICAL MASTECTOMY LND  1990   left, chemo done   RIGHT/LEFT HEART CATH AND CORONARY ANGIOGRAPHY N/A 10/17/2022    Procedure: RIGHT/LEFT HEART CATH AND CORONARY ANGIOGRAPHY;  Surgeon: Anner Alm ORN, MD;  [facility] INVASIVE CV LAB;  Service: Cardiovascular;  Laterality: N/A;   ROBOTIC ASSISTED TOTAL HYSTERECTOMY WITH BILATERAL SALPINGO OOPHERECTOMY Bilateral 03/21/2014   Procedure: ROBOTIC ASSISTED TOTAL HYSTERECTOMY WITH BILATERAL SALPINGO OOPHORECTOMY ;  Surgeon: Sari Bachelor, MD;  Location: [facility] ORS;  Service: Gynecology;  Laterality: Bilateral;    Current Outpatient Medications  Medication Sig Dispense Refill   aspirin  EC (ASPIRIN  LOW DOSE) 81 MG tablet TAKE 1 TABLET (81 MG TOTAL) BY MOUTH DAILY. SWALLOW WHOLE. 90 tablet 2   Astaxanthin 4 MG CAPS 1 capsule.     atorvastatin  (LIPITOR) 40 MG tablet TAKE 1 TABLET BY MOUTH EVERY DAY 90 tablet 2   carvedilol  (COREG ) 6.25 MG tablet Take 6.25 mg by mouth 2 (two) times daily.     Cyanocobalamin (VITAMIN B 12 PO) Take 1 tablet by mouth every morning.      dapagliflozin  propanediol (FARXIGA ) 10 MG TABS tablet Take 1 tablet (10 mg total) by mouth daily before breakfast. 90 tablet 3   denosumab  (PROLIA ) 60 MG/ML SOLN injection Inject 60 mg into the skin every 6 (six) months. Administer in upper arm, thigh, or abdomen     Flaxseed, Linseed, (FLAX SEEDS PO) Take 1 scoop by mouth every morning. Ground flaxseed in cereal.     hydrALAZINE  (APRESOLINE ) 25 MG tablet Take 1 tablet (25 mg total) by mouth as needed (for blood pressure greater than 160/100. May take up to twice daily.). 180 tablet 3   losartan  (COZAAR ) 50 MG tablet Take 1 tablet (50 mg total) by mouth daily.     Multiple Vitamins-Minerals (PRESERVISION AREDS) CAPS Take 1 capsule by mouth daily.     NONFORMULARY OR COMPOUNDED ITEM Prosthetic bra.  Please supply 2 bras.  Refill:  2. 2 each 2   VITAMIN D, CHOLECALCIFEROL, PO Take 4,000 Int'l Units by mouth every morning.      No current facility-administered medications for this visit.    ALLERGIES: Celecoxib, Kenalog  [triamcinolone ], Prednisone, and  Procaine  Family History  Problem Relation Age of Onset   Breast cancer Sister 21       recurrence age 17   Breast cancer Paternal Grandmother    Heart disease Mother    Heart disease Father    Hypertension Sister    Dementia Sister    Breast cancer Maternal Aunt 59   Dementia Maternal Aunt        dx in her 17s    Review of Systems  All other systems reviewed and are negative.  PHYSICAL EXAM:  BP 132/84 (BP Location: Left Arm, Patient Position: Sitting)   Pulse 88   Ht 4' 11.25 (1.505 m)   Wt 120 lb (54.4 kg)   LMP 03/31/1988 (Approximate)   SpO2 97%   BMI 24.03 kg/m     General appearance: alert, cooperative and appears stated age Head: normocephalic, without obvious abnormality, atraumatic Neck: no adenopathy, supple, symmetrical, trachea midline and thyroid  normal to inspection and palpation Lungs: clear to auscultation bilaterally Breasts: absent bilaterally.  No axillary adenopathy Heart: regular rate and rhythm Abdomen: soft, non-tender; no masses, no organomegaly Extremities: extremities normal, atraumatic, no cyanosis or edema Skin: skin color, texture, turgor normal. No rashes or lesions Lymph nodes: cervical, supraclavicular, and axillary nodes normal. Neurologic: grossly normal  Pelvic: External genitalia:  no lesions              No abnormal inguinal nodes palpated.              Urethra:  normal appearing urethra with no masses, tenderness or lesions              Bartholins and Skenes: normal                 Vagina: normal appearing vagina with normal color and discharge, no lesions              Cervix: absent.  Vaginal length is shortened.                Pap taken: no.   Bimanual Exam:  Uterus:  absent.              Adnexa: no mass, fullness, tenderness              Rectal exam: yes.  Confirms.              Anus:  normal sphincter tone, no lesions  Chaperone was present for exam:  Kari HERO, CMA  ASSESSMENT: Encounter for breast and pelvic exam.   Hx endometrioid uterine cancer, FIGO, grade II.  No recurrence.  Status post robotic hysterectomy with BSO  Status post vaginal brachytherapy.  Vaginal stenosis.  Colposcopy with vaginal biopsy 06/02/16 - VAIN I on colposcopy.  Hx prior positive HR HPV.  Follow up paps normal and negative HR HPV. Hx urinary incontinence.  Improved with weight loss and exercise.  Hx fecal soiling.  Hx XRT.  Vaginal atrophy.  Osteopenia. On Prolia  through PCP.  No current bone density on file.  Hx NSTEMI. Off Plavix .  Rectal bleeding while on Plavix .  Patient stopped Plavix  and has declined a work up.   PLAN: Mammogram screening discussed. Self breast awareness reviewed. Pap and HRV collected:  no.  Due in 2027.  Guidelines for Calcium , Vitamin D, regular exercise program including cardiovascular and weight bearing exercise. Medication refills:  NA.  We discussed tx options for vaginal atrophy.   Will get copy of her bone density from [facility].   Follow up:  yearly and prn.     Additional counseling given.  Yes for vaginal atrophy. Patient is not a candidate for vaginal estrogen therapy.  Options reviewed of water based lubricants, cooking oils, and vaginal vitamin E in suppository or liquid form.  She declines a prescription and will pursue OTC Replens.   10 min  total time was spent for this patient encounter, including preparation, face-to-face counseling with the patient, coordination of care, and documentation of the encounter in addition to doing the breast and pelvic  exam.

## 2024-02-17 NOTE — Patient Instructions (Signed)

## 2024-02-18 ENCOUNTER — Encounter: Payer: Self-pay | Admitting: Obstetrics and Gynecology

## 2024-02-18 DIAGNOSIS — M25531 Pain in right wrist: Secondary | ICD-10-CM | POA: Diagnosis not present

## 2024-02-19 ENCOUNTER — Ambulatory Visit: Payer: Self-pay | Admitting: Obstetrics and Gynecology

## 2024-03-01 ENCOUNTER — Other Ambulatory Visit: Payer: Self-pay | Admitting: Cardiology

## 2024-03-02 ENCOUNTER — Telehealth: Payer: Self-pay | Admitting: Cardiology

## 2024-03-02 NOTE — Telephone Encounter (Signed)
*  STAT* If patient is at the pharmacy, call can be transferred to refill team.   1. Which medications need to be refilled? (please list name of each medication and dose if known)  losartan  (COZAAR ) 50 MG tablet   2. Would you like to learn more about the convenience, safety, & potential cost savings by using the [facility]? no   3. Are you open to using the [facility] (Type [facility]. no   4. Which pharmacy/location (including street and city if local pharmacy) is medication to be sent to?  [id number] - [place], [place] - [address]    5. Do they need a 30 day or 90 day? 90 day

## 2024-03-03 MED ORDER — LOSARTAN POTASSIUM 50 MG PO TABS: 50.0000 mg | ORAL_TABLET | Freq: Every day | ORAL | 1 refills | Status: AC

## 2024-03-03 NOTE — Telephone Encounter (Signed)
 Refill sent

## 2024-03-07 DIAGNOSIS — M25531 Pain in right wrist: Secondary | ICD-10-CM | POA: Diagnosis not present

## 2024-03-07 DIAGNOSIS — M25641 Stiffness of right hand, not elsewhere classified: Secondary | ICD-10-CM | POA: Diagnosis not present

## 2024-03-15 ENCOUNTER — Ambulatory Visit: Admitting: Podiatry

## 2024-03-15 ENCOUNTER — Encounter: Payer: Self-pay | Admitting: Podiatry

## 2024-03-15 DIAGNOSIS — B351 Tinea unguium: Secondary | ICD-10-CM | POA: Diagnosis not present

## 2024-03-15 DIAGNOSIS — M79676 Pain in unspecified toe(s): Secondary | ICD-10-CM | POA: Diagnosis not present

## 2024-03-15 NOTE — Progress Notes (Signed)
 Subjective: Chief Complaint  Patient presents with   RFC    Rm11 Routine Foot Care    83 y.o. returns the office today for painful, elongated, thickened toenails which she is unable to trim herself.  The ingrown toenail still cause discomfort at times.  She does not report any swelling, redness or any drainage to the area send or with pressure.  No open lesions.  Objective: AAO 3, NAD DP/PT pulses palpable, CRT less than 3 seconds Nails hypertrophic, dystrophic, elongated, brittle, discolored 10.  Incurvation of the nails mostly on the right without signs of infection today.  There is no drainage or pus today.  There is tenderness overlying the nails 1-5 bilaterally.  No signs of infection.  Ingrown toenails present to multiple toenails without any signs of infection with the right medial and lateral second digits.  No signs of infection today. Hammertoes present No pain with calf compression, warmth, erythema.  Assessment: Symptomatic onychomycosis, ingrown toenails  Plan: -Treatment options including alternatives, risks, complications were discussed -Nails sharply debrided 10 without any complications or bleeding.  Monitor ingrown toenails.  Discussed that if the ingrown toenail becomes more symptomatic need to proceed with partial nail avulsion. - Very minimal callus formation at distal aspect of the left second toe today.  Continue offloading.  Beth Simon DPM

## 2024-04-29 ENCOUNTER — Ambulatory Visit: Admitting: Podiatry

## 2024-05-31 ENCOUNTER — Ambulatory Visit: Admitting: Podiatry

## 2024-07-18 ENCOUNTER — Ambulatory Visit: Admitting: Cardiology

## 2025-03-01 ENCOUNTER — Encounter: Admitting: Obstetrics and Gynecology
